# Patient Record
Sex: Female | Born: 1940 | Race: Black or African American | Hispanic: No | Smoking: Former smoker
Health system: Southern US, Community
[De-identification: ages and names within clinical notes are randomized; demographics above are authoritative.]

## PROBLEM LIST (undated history)

## (undated) DIAGNOSIS — J189 Pneumonia, unspecified organism: Secondary | ICD-10-CM

## (undated) DIAGNOSIS — Z940 Kidney transplant status: Secondary | ICD-10-CM

## (undated) DIAGNOSIS — Z803 Family history of malignant neoplasm of breast: Secondary | ICD-10-CM

## (undated) DIAGNOSIS — M19012 Primary osteoarthritis, left shoulder: Secondary | ICD-10-CM

## (undated) DIAGNOSIS — I219 Acute myocardial infarction, unspecified: Secondary | ICD-10-CM

## (undated) DIAGNOSIS — M7542 Impingement syndrome of left shoulder: Secondary | ICD-10-CM

## (undated) DIAGNOSIS — Z8042 Family history of malignant neoplasm of prostate: Secondary | ICD-10-CM

## (undated) DIAGNOSIS — D649 Anemia, unspecified: Secondary | ICD-10-CM

## (undated) DIAGNOSIS — I2510 Atherosclerotic heart disease of native coronary artery without angina pectoris: Secondary | ICD-10-CM

## (undated) DIAGNOSIS — Z992 Dependence on renal dialysis: Secondary | ICD-10-CM

## (undated) DIAGNOSIS — I10 Essential (primary) hypertension: Secondary | ICD-10-CM

## (undated) DIAGNOSIS — E785 Hyperlipidemia, unspecified: Secondary | ICD-10-CM

## (undated) DIAGNOSIS — K219 Gastro-esophageal reflux disease without esophagitis: Secondary | ICD-10-CM

## (undated) DIAGNOSIS — Z8614 Personal history of Methicillin resistant Staphylococcus aureus infection: Secondary | ICD-10-CM

## (undated) DIAGNOSIS — K922 Gastrointestinal hemorrhage, unspecified: Secondary | ICD-10-CM

## (undated) DIAGNOSIS — N186 End stage renal disease: Secondary | ICD-10-CM

## (undated) HISTORY — DX: Gastrointestinal hemorrhage, unspecified: K92.2

## (undated) HISTORY — DX: Pneumonia, unspecified organism: J18.9

## (undated) HISTORY — DX: Atherosclerotic heart disease of native coronary artery without angina pectoris: I25.10

## (undated) HISTORY — PX: ABDOMINAL HYSTERECTOMY: SHX81

## (undated) HISTORY — PX: CARDIAC CATHETERIZATION: SHX172

## (undated) HISTORY — DX: Essential (primary) hypertension: I10

## (undated) HISTORY — DX: Family history of malignant neoplasm of prostate: Z80.42

## (undated) HISTORY — PX: BREAST SURGERY: SHX581

## (undated) HISTORY — DX: End stage renal disease: N18.6

## (undated) HISTORY — DX: Anemia, unspecified: D64.9

## (undated) HISTORY — DX: Dependence on renal dialysis: Z99.2

## (undated) HISTORY — DX: Hyperlipidemia, unspecified: E78.5

## (undated) HISTORY — PX: BREAST EXCISIONAL BIOPSY: SUR124

## (undated) HISTORY — DX: Family history of malignant neoplasm of breast: Z80.3

---

## 1982-11-20 HISTORY — PX: VESICOVAGINAL FISTULA CLOSURE W/ TAH: SUR271

## 1998-06-25 ENCOUNTER — Ambulatory Visit: Admission: RE | Admit: 1998-06-25 | Discharge: 1998-06-25 | Payer: Self-pay | Admitting: Family Medicine

## 1998-08-18 ENCOUNTER — Encounter: Payer: Self-pay | Admitting: Family Medicine

## 1998-08-18 ENCOUNTER — Ambulatory Visit: Admission: RE | Admit: 1998-08-18 | Discharge: 1998-08-18 | Payer: Self-pay | Admitting: Family Medicine

## 1999-10-31 ENCOUNTER — Ambulatory Visit (HOSPITAL_COMMUNITY): Admission: RE | Admit: 1999-10-31 | Discharge: 1999-10-31 | Payer: Self-pay | Admitting: Family Medicine

## 1999-10-31 ENCOUNTER — Encounter: Payer: Self-pay | Admitting: Family Medicine

## 2001-05-01 ENCOUNTER — Encounter (HOSPITAL_COMMUNITY): Admission: RE | Admit: 2001-05-01 | Discharge: 2001-07-30 | Payer: Self-pay | Admitting: Nephrology

## 2001-07-30 ENCOUNTER — Ambulatory Visit (HOSPITAL_COMMUNITY): Admission: RE | Admit: 2001-07-30 | Discharge: 2001-07-30 | Payer: Self-pay | Admitting: Family Medicine

## 2001-07-30 ENCOUNTER — Encounter: Payer: Self-pay | Admitting: Family Medicine

## 2001-07-31 ENCOUNTER — Ambulatory Visit (HOSPITAL_COMMUNITY): Admission: RE | Admit: 2001-07-31 | Discharge: 2001-07-31 | Payer: Self-pay | Admitting: Nephrology

## 2001-08-06 ENCOUNTER — Encounter: Payer: Self-pay | Admitting: Family Medicine

## 2001-08-07 ENCOUNTER — Encounter (HOSPITAL_COMMUNITY): Admission: RE | Admit: 2001-08-07 | Discharge: 2001-11-05 | Payer: Self-pay | Admitting: Nephrology

## 2001-08-08 ENCOUNTER — Encounter: Admission: RE | Admit: 2001-08-08 | Discharge: 2001-08-08 | Payer: Self-pay | Admitting: Family Medicine

## 2001-08-08 ENCOUNTER — Encounter: Payer: Self-pay | Admitting: Family Medicine

## 2001-11-06 ENCOUNTER — Encounter (HOSPITAL_COMMUNITY): Admission: RE | Admit: 2001-11-06 | Discharge: 2002-02-04 | Payer: Self-pay | Admitting: Nephrology

## 2002-01-03 ENCOUNTER — Ambulatory Visit (HOSPITAL_COMMUNITY): Admission: RE | Admit: 2002-01-03 | Discharge: 2002-01-03 | Payer: Self-pay | Admitting: Endocrinology

## 2002-01-03 ENCOUNTER — Encounter: Payer: Self-pay | Admitting: Endocrinology

## 2002-02-06 ENCOUNTER — Encounter (HOSPITAL_COMMUNITY): Admission: RE | Admit: 2002-02-06 | Discharge: 2002-05-07 | Payer: Self-pay | Admitting: Nephrology

## 2002-05-21 ENCOUNTER — Encounter (HOSPITAL_COMMUNITY): Admission: RE | Admit: 2002-05-21 | Discharge: 2002-08-19 | Payer: Self-pay | Admitting: Nephrology

## 2002-06-02 ENCOUNTER — Encounter: Payer: Self-pay | Admitting: Cardiology

## 2002-06-02 ENCOUNTER — Inpatient Hospital Stay (HOSPITAL_COMMUNITY): Admission: EM | Admit: 2002-06-02 | Discharge: 2002-06-11 | Payer: Self-pay | Admitting: Emergency Medicine

## 2002-06-03 ENCOUNTER — Encounter: Payer: Self-pay | Admitting: Cardiology

## 2002-06-04 ENCOUNTER — Encounter: Payer: Self-pay | Admitting: Cardiothoracic Surgery

## 2002-06-05 ENCOUNTER — Encounter: Payer: Self-pay | Admitting: Cardiothoracic Surgery

## 2002-06-06 ENCOUNTER — Encounter: Payer: Self-pay | Admitting: Cardiothoracic Surgery

## 2002-06-06 HISTORY — PX: CORONARY ARTERY BYPASS GRAFT: SHX141

## 2002-06-07 ENCOUNTER — Encounter: Payer: Self-pay | Admitting: Cardiothoracic Surgery

## 2002-06-08 ENCOUNTER — Encounter: Payer: Self-pay | Admitting: Cardiothoracic Surgery

## 2002-06-09 ENCOUNTER — Encounter: Payer: Self-pay | Admitting: Cardiothoracic Surgery

## 2002-06-10 ENCOUNTER — Encounter: Payer: Self-pay | Admitting: Cardiothoracic Surgery

## 2002-07-03 ENCOUNTER — Encounter: Payer: Self-pay | Admitting: Cardiothoracic Surgery

## 2002-07-03 ENCOUNTER — Encounter: Admission: RE | Admit: 2002-07-03 | Discharge: 2002-07-03 | Payer: Self-pay | Admitting: Cardiothoracic Surgery

## 2002-08-04 ENCOUNTER — Encounter (HOSPITAL_COMMUNITY): Admission: RE | Admit: 2002-08-04 | Discharge: 2002-11-02 | Payer: Self-pay | Admitting: Cardiology

## 2002-08-20 ENCOUNTER — Encounter (HOSPITAL_COMMUNITY): Admission: RE | Admit: 2002-08-20 | Discharge: 2002-11-18 | Payer: Self-pay | Admitting: Nephrology

## 2002-11-19 ENCOUNTER — Encounter (HOSPITAL_COMMUNITY): Admission: RE | Admit: 2002-11-19 | Discharge: 2003-02-17 | Payer: Self-pay | Admitting: Nephrology

## 2003-01-22 ENCOUNTER — Encounter: Payer: Self-pay | Admitting: Emergency Medicine

## 2003-01-22 ENCOUNTER — Inpatient Hospital Stay (HOSPITAL_COMMUNITY): Admission: EM | Admit: 2003-01-22 | Discharge: 2003-01-25 | Payer: Self-pay | Admitting: Emergency Medicine

## 2003-01-22 ENCOUNTER — Encounter: Payer: Self-pay | Admitting: Cardiology

## 2003-01-23 ENCOUNTER — Encounter: Payer: Self-pay | Admitting: Cardiology

## 2003-02-18 ENCOUNTER — Encounter (HOSPITAL_COMMUNITY): Admission: RE | Admit: 2003-02-18 | Discharge: 2003-05-19 | Payer: Self-pay | Admitting: Nephrology

## 2003-03-11 ENCOUNTER — Encounter: Payer: Self-pay | Admitting: Nephrology

## 2003-03-11 ENCOUNTER — Encounter: Admission: RE | Admit: 2003-03-11 | Discharge: 2003-03-11 | Payer: Self-pay | Admitting: Nephrology

## 2003-05-20 ENCOUNTER — Encounter (HOSPITAL_COMMUNITY): Admission: RE | Admit: 2003-05-20 | Discharge: 2003-08-18 | Payer: Self-pay | Admitting: Nephrology

## 2003-08-19 ENCOUNTER — Encounter (HOSPITAL_COMMUNITY): Admission: RE | Admit: 2003-08-19 | Discharge: 2003-11-17 | Payer: Self-pay | Admitting: Nephrology

## 2003-09-29 ENCOUNTER — Encounter: Admission: RE | Admit: 2003-09-29 | Discharge: 2003-09-29 | Payer: Self-pay | Admitting: Nephrology

## 2003-10-30 ENCOUNTER — Ambulatory Visit (HOSPITAL_COMMUNITY): Admission: RE | Admit: 2003-10-30 | Discharge: 2003-10-30 | Payer: Self-pay | Admitting: Endocrinology

## 2003-11-18 ENCOUNTER — Encounter (HOSPITAL_COMMUNITY): Admission: RE | Admit: 2003-11-18 | Discharge: 2004-02-16 | Payer: Self-pay | Admitting: Nephrology

## 2004-02-24 ENCOUNTER — Encounter (HOSPITAL_COMMUNITY): Admission: RE | Admit: 2004-02-24 | Discharge: 2004-05-24 | Payer: Self-pay | Admitting: Nephrology

## 2004-05-25 ENCOUNTER — Encounter (HOSPITAL_COMMUNITY): Admission: RE | Admit: 2004-05-25 | Discharge: 2004-08-23 | Payer: Self-pay | Admitting: Nephrology

## 2004-06-05 ENCOUNTER — Inpatient Hospital Stay (HOSPITAL_COMMUNITY): Admission: EM | Admit: 2004-06-05 | Discharge: 2004-06-15 | Payer: Self-pay | Admitting: Emergency Medicine

## 2004-08-31 ENCOUNTER — Encounter (HOSPITAL_COMMUNITY): Admission: RE | Admit: 2004-08-31 | Discharge: 2004-11-29 | Payer: Self-pay | Admitting: Nephrology

## 2004-10-17 ENCOUNTER — Ambulatory Visit: Payer: Self-pay | Admitting: Endocrinology

## 2004-10-24 ENCOUNTER — Ambulatory Visit: Payer: Self-pay | Admitting: Endocrinology

## 2004-11-18 ENCOUNTER — Ambulatory Visit: Payer: Self-pay | Admitting: Endocrinology

## 2004-12-07 ENCOUNTER — Encounter (HOSPITAL_COMMUNITY): Admission: RE | Admit: 2004-12-07 | Discharge: 2005-03-07 | Payer: Self-pay | Admitting: Nephrology

## 2005-03-15 ENCOUNTER — Encounter (HOSPITAL_COMMUNITY): Admission: RE | Admit: 2005-03-15 | Discharge: 2005-06-13 | Payer: Self-pay | Admitting: Nephrology

## 2005-06-13 ENCOUNTER — Encounter (HOSPITAL_COMMUNITY): Admission: RE | Admit: 2005-06-13 | Discharge: 2005-09-11 | Payer: Self-pay | Admitting: Nephrology

## 2005-09-13 ENCOUNTER — Encounter (HOSPITAL_COMMUNITY): Admission: RE | Admit: 2005-09-13 | Discharge: 2005-12-12 | Payer: Self-pay | Admitting: Nephrology

## 2005-12-20 ENCOUNTER — Encounter (HOSPITAL_COMMUNITY): Admission: RE | Admit: 2005-12-20 | Discharge: 2006-03-20 | Payer: Self-pay | Admitting: Nephrology

## 2006-03-28 ENCOUNTER — Encounter (HOSPITAL_COMMUNITY): Admission: RE | Admit: 2006-03-28 | Discharge: 2006-06-26 | Payer: Self-pay | Admitting: Nephrology

## 2006-04-20 ENCOUNTER — Ambulatory Visit: Payer: Self-pay | Admitting: Endocrinology

## 2006-05-02 ENCOUNTER — Ambulatory Visit: Payer: Self-pay | Admitting: Endocrinology

## 2006-05-17 ENCOUNTER — Ambulatory Visit: Payer: Self-pay | Admitting: Internal Medicine

## 2006-06-06 ENCOUNTER — Emergency Department (HOSPITAL_COMMUNITY): Admission: EM | Admit: 2006-06-06 | Discharge: 2006-06-06 | Payer: Self-pay | Admitting: Emergency Medicine

## 2006-06-27 ENCOUNTER — Encounter (HOSPITAL_COMMUNITY): Admission: RE | Admit: 2006-06-27 | Discharge: 2006-08-03 | Payer: Self-pay | Admitting: Nephrology

## 2006-07-12 ENCOUNTER — Ambulatory Visit (HOSPITAL_COMMUNITY): Admission: RE | Admit: 2006-07-12 | Discharge: 2006-07-12 | Payer: Self-pay | Admitting: Endocrinology

## 2006-08-08 ENCOUNTER — Encounter: Admission: RE | Admit: 2006-08-08 | Discharge: 2006-11-06 | Payer: Self-pay | Admitting: Nephrology

## 2006-11-14 ENCOUNTER — Encounter (HOSPITAL_COMMUNITY): Admission: RE | Admit: 2006-11-14 | Discharge: 2007-02-12 | Payer: Self-pay | Admitting: Nephrology

## 2007-02-20 ENCOUNTER — Encounter (HOSPITAL_COMMUNITY): Admission: RE | Admit: 2007-02-20 | Discharge: 2007-05-21 | Payer: Self-pay | Admitting: Nephrology

## 2007-03-01 ENCOUNTER — Ambulatory Visit: Payer: Self-pay | Admitting: Vascular Surgery

## 2007-03-04 ENCOUNTER — Ambulatory Visit (HOSPITAL_COMMUNITY): Admission: RE | Admit: 2007-03-04 | Discharge: 2007-03-04 | Payer: Self-pay | Admitting: Vascular Surgery

## 2007-03-04 ENCOUNTER — Ambulatory Visit: Payer: Self-pay | Admitting: Vascular Surgery

## 2007-03-04 HISTORY — PX: ARTERIOVENOUS GRAFT PLACEMENT: SUR1029

## 2007-03-22 ENCOUNTER — Ambulatory Visit: Payer: Self-pay | Admitting: Vascular Surgery

## 2007-04-01 ENCOUNTER — Ambulatory Visit: Payer: Self-pay

## 2007-05-02 ENCOUNTER — Ambulatory Visit: Payer: Self-pay | Admitting: Endocrinology

## 2007-05-02 LAB — CONVERTED CEMR LAB
AST: 21 units/L (ref 0–37)
BUN: 109 mg/dL (ref 6–23)
Bacteria, UA: NEGATIVE
Bilirubin, Direct: 0.3 mg/dL (ref 0.0–0.3)
CO2: 21 meq/L (ref 19–32)
Calcium: 8.9 mg/dL (ref 8.4–10.5)
Chloride: 110 meq/L (ref 96–112)
Creatinine, Ser: 4.1 mg/dL — ABNORMAL HIGH (ref 0.4–1.2)
Glucose, Bld: 84 mg/dL (ref 70–99)
HCT: 27.4 % — ABNORMAL LOW (ref 36.0–46.0)
Hemoglobin: 9.2 g/dL — ABNORMAL LOW (ref 12.0–15.0)
LDL Cholesterol: 82 mg/dL (ref 0–99)
Lymphocytes Relative: 30.2 % (ref 12.0–46.0)
MCHC: 33.5 g/dL (ref 30.0–36.0)
MCV: 83.2 fL (ref 78.0–100.0)
Mucus, UA: NEGATIVE
Neutrophils Relative %: 56.4 % (ref 43.0–77.0)
Nitrite: NEGATIVE
Potassium: 4.7 meq/L (ref 3.5–5.1)
RBC: 3.29 M/uL — ABNORMAL LOW (ref 3.87–5.11)
RDW: 17.8 % — ABNORMAL HIGH (ref 11.5–14.6)
TSH: 1.39 microintl units/mL (ref 0.35–5.50)
Total CHOL/HDL Ratio: 4.1
Total Protein, Urine: 100 mg/dL — AB
WBC: 4.5 10*3/uL (ref 4.5–10.5)
pH: 6 (ref 5.0–8.0)

## 2007-05-06 ENCOUNTER — Ambulatory Visit: Payer: Self-pay | Admitting: Endocrinology

## 2007-05-08 ENCOUNTER — Ambulatory Visit: Payer: Self-pay | Admitting: Vascular Surgery

## 2007-05-08 ENCOUNTER — Ambulatory Visit (HOSPITAL_COMMUNITY): Admission: RE | Admit: 2007-05-08 | Discharge: 2007-05-08 | Payer: Self-pay | Admitting: Vascular Surgery

## 2007-05-08 HISTORY — PX: THROMBECTOMY AND REVISION OF ARTERIOVENTOUS (AV) GORETEX  GRAFT: SHX6120

## 2007-05-20 ENCOUNTER — Ambulatory Visit: Payer: Self-pay | Admitting: Internal Medicine

## 2007-05-22 ENCOUNTER — Encounter (HOSPITAL_COMMUNITY): Admission: RE | Admit: 2007-05-22 | Discharge: 2007-08-20 | Payer: Self-pay | Admitting: Nephrology

## 2007-08-12 ENCOUNTER — Encounter: Payer: Self-pay | Admitting: *Deleted

## 2007-08-12 DIAGNOSIS — J4489 Other specified chronic obstructive pulmonary disease: Secondary | ICD-10-CM | POA: Insufficient documentation

## 2007-08-12 DIAGNOSIS — M109 Gout, unspecified: Secondary | ICD-10-CM | POA: Insufficient documentation

## 2007-08-12 DIAGNOSIS — N951 Menopausal and female climacteric states: Secondary | ICD-10-CM | POA: Insufficient documentation

## 2007-08-12 DIAGNOSIS — R42 Dizziness and giddiness: Secondary | ICD-10-CM | POA: Insufficient documentation

## 2007-08-12 DIAGNOSIS — J45909 Unspecified asthma, uncomplicated: Secondary | ICD-10-CM | POA: Insufficient documentation

## 2007-08-12 DIAGNOSIS — M810 Age-related osteoporosis without current pathological fracture: Secondary | ICD-10-CM | POA: Insufficient documentation

## 2007-09-04 ENCOUNTER — Encounter (HOSPITAL_COMMUNITY): Admission: RE | Admit: 2007-09-04 | Discharge: 2007-10-23 | Payer: Self-pay | Admitting: Nephrology

## 2007-09-12 ENCOUNTER — Ambulatory Visit (HOSPITAL_COMMUNITY): Admission: RE | Admit: 2007-09-12 | Discharge: 2007-09-12 | Payer: Self-pay | Admitting: Endocrinology

## 2007-12-02 ENCOUNTER — Ambulatory Visit: Payer: Self-pay | Admitting: Internal Medicine

## 2007-12-02 ENCOUNTER — Inpatient Hospital Stay (HOSPITAL_COMMUNITY): Admission: EM | Admit: 2007-12-02 | Discharge: 2007-12-04 | Payer: Self-pay | Admitting: Emergency Medicine

## 2007-12-04 ENCOUNTER — Encounter: Payer: Self-pay | Admitting: Internal Medicine

## 2007-12-20 ENCOUNTER — Ambulatory Visit: Payer: Self-pay | Admitting: Cardiology

## 2008-01-13 ENCOUNTER — Encounter: Payer: Self-pay | Admitting: Endocrinology

## 2008-04-07 ENCOUNTER — Ambulatory Visit: Payer: Self-pay | Admitting: Endocrinology

## 2008-04-07 ENCOUNTER — Other Ambulatory Visit: Admission: RE | Admit: 2008-04-07 | Discharge: 2008-04-07 | Payer: Self-pay | Admitting: Endocrinology

## 2008-04-07 ENCOUNTER — Encounter: Payer: Self-pay | Admitting: Endocrinology

## 2008-07-13 ENCOUNTER — Encounter: Admission: RE | Admit: 2008-07-13 | Discharge: 2008-07-13 | Payer: Self-pay | Admitting: Critical Care Medicine

## 2008-07-20 ENCOUNTER — Ambulatory Visit: Payer: Self-pay | Admitting: Vascular Surgery

## 2008-07-20 ENCOUNTER — Encounter (INDEPENDENT_AMBULATORY_CARE_PROVIDER_SITE_OTHER): Payer: Self-pay | Admitting: Emergency Medicine

## 2008-07-20 ENCOUNTER — Emergency Department (HOSPITAL_COMMUNITY): Admission: EM | Admit: 2008-07-20 | Discharge: 2008-07-20 | Payer: Self-pay | Admitting: Emergency Medicine

## 2008-11-09 ENCOUNTER — Ambulatory Visit (HOSPITAL_COMMUNITY): Admission: RE | Admit: 2008-11-09 | Discharge: 2008-11-09 | Payer: Self-pay | Admitting: Endocrinology

## 2008-11-18 ENCOUNTER — Encounter: Admission: RE | Admit: 2008-11-18 | Discharge: 2008-11-18 | Payer: Self-pay | Admitting: Nephrology

## 2008-12-16 ENCOUNTER — Encounter: Payer: Self-pay | Admitting: Endocrinology

## 2008-12-24 ENCOUNTER — Ambulatory Visit: Payer: Self-pay | Admitting: Cardiology

## 2009-01-07 ENCOUNTER — Telehealth: Payer: Self-pay | Admitting: Endocrinology

## 2009-01-07 ENCOUNTER — Ambulatory Visit: Payer: Self-pay | Admitting: Endocrinology

## 2009-01-09 LAB — CONVERTED CEMR LAB
AST: 23 units/L (ref 0–37)
Albumin: 3.6 g/dL (ref 3.5–5.2)
Alkaline Phosphatase: 78 units/L (ref 39–117)
BUN: 36 mg/dL — ABNORMAL HIGH (ref 6–23)
Basophils Relative: 0.5 % (ref 0.0–3.0)
Calcium: 10 mg/dL (ref 8.4–10.5)
Cholesterol: 133 mg/dL (ref 0–200)
Creatinine, Ser: 7.6 mg/dL (ref 0.4–1.2)
Crystals: NEGATIVE
Eosinophils Absolute: 0.2 10*3/uL (ref 0.0–0.7)
Eosinophils Relative: 3.4 % (ref 0.0–5.0)
Glucose, Bld: 93 mg/dL (ref 70–99)
HCT: 38.2 % (ref 36.0–46.0)
Ketones, ur: 15 mg/dL — AB
LDL Cholesterol: 67 mg/dL (ref 0–99)
MCV: 89.1 fL (ref 78.0–100.0)
Monocytes Absolute: 0.8 10*3/uL (ref 0.1–1.0)
Neutro Abs: 3.7 10*3/uL (ref 1.4–7.7)
Nitrite: NEGATIVE
Platelets: 139 10*3/uL — ABNORMAL LOW (ref 150–400)
RBC: 4.29 M/uL (ref 3.87–5.11)
Specific Gravity, Urine: 1.025 (ref 1.000–1.03)
Total Bilirubin: 0.8 mg/dL (ref 0.3–1.2)
Total CHOL/HDL Ratio: 2.7
Total Protein: 7.2 g/dL (ref 6.0–8.3)
Triglycerides: 84 mg/dL (ref 0–149)
Urobilinogen, UA: 0.2 (ref 0.0–1.0)
VLDL: 17 mg/dL (ref 0–40)
WBC: 6.9 10*3/uL (ref 4.5–10.5)
pH: 5.5 (ref 5.0–8.0)

## 2009-01-11 ENCOUNTER — Ambulatory Visit: Payer: Self-pay | Admitting: Endocrinology

## 2009-01-11 DIAGNOSIS — N2581 Secondary hyperparathyroidism of renal origin: Secondary | ICD-10-CM | POA: Insufficient documentation

## 2009-01-11 DIAGNOSIS — D696 Thrombocytopenia, unspecified: Secondary | ICD-10-CM | POA: Insufficient documentation

## 2009-01-19 ENCOUNTER — Telehealth: Payer: Self-pay | Admitting: Endocrinology

## 2009-06-11 ENCOUNTER — Encounter: Admission: RE | Admit: 2009-06-11 | Discharge: 2009-06-11 | Payer: Self-pay | Admitting: Nephrology

## 2009-06-15 ENCOUNTER — Ambulatory Visit: Payer: Self-pay | Admitting: Internal Medicine

## 2009-06-15 ENCOUNTER — Encounter: Payer: Self-pay | Admitting: Endocrinology

## 2009-08-16 ENCOUNTER — Emergency Department (HOSPITAL_COMMUNITY): Admission: EM | Admit: 2009-08-16 | Discharge: 2009-08-16 | Payer: Self-pay | Admitting: Emergency Medicine

## 2009-10-27 ENCOUNTER — Encounter: Admission: RE | Admit: 2009-10-27 | Discharge: 2009-10-27 | Payer: Self-pay | Admitting: Nephrology

## 2009-10-27 ENCOUNTER — Inpatient Hospital Stay (HOSPITAL_COMMUNITY): Admission: EM | Admit: 2009-10-27 | Discharge: 2009-11-02 | Payer: Self-pay | Admitting: Emergency Medicine

## 2009-11-02 ENCOUNTER — Encounter (INDEPENDENT_AMBULATORY_CARE_PROVIDER_SITE_OTHER): Payer: Self-pay | Admitting: Internal Medicine

## 2009-11-18 ENCOUNTER — Encounter (INDEPENDENT_AMBULATORY_CARE_PROVIDER_SITE_OTHER): Payer: Self-pay | Admitting: *Deleted

## 2009-11-25 ENCOUNTER — Ambulatory Visit (HOSPITAL_COMMUNITY): Admission: RE | Admit: 2009-11-25 | Discharge: 2009-11-25 | Payer: Self-pay | Admitting: Endocrinology

## 2010-01-05 ENCOUNTER — Encounter: Payer: Self-pay | Admitting: Cardiology

## 2010-01-06 ENCOUNTER — Ambulatory Visit: Payer: Self-pay | Admitting: Cardiology

## 2010-01-12 ENCOUNTER — Encounter: Payer: Self-pay | Admitting: Endocrinology

## 2010-01-20 ENCOUNTER — Ambulatory Visit: Payer: Self-pay | Admitting: Endocrinology

## 2010-01-20 ENCOUNTER — Other Ambulatory Visit: Admission: RE | Admit: 2010-01-20 | Discharge: 2010-01-20 | Payer: Self-pay | Admitting: Endocrinology

## 2010-01-20 DIAGNOSIS — M79609 Pain in unspecified limb: Secondary | ICD-10-CM | POA: Insufficient documentation

## 2010-01-25 ENCOUNTER — Telehealth (INDEPENDENT_AMBULATORY_CARE_PROVIDER_SITE_OTHER): Payer: Self-pay | Admitting: *Deleted

## 2010-01-26 ENCOUNTER — Ambulatory Visit: Payer: Self-pay | Admitting: Endocrinology

## 2010-01-27 LAB — CONVERTED CEMR LAB: Hgb A1c MFr Bld: 4.7 % (ref 4.6–6.5)

## 2010-04-26 ENCOUNTER — Telehealth: Payer: Self-pay | Admitting: Cardiology

## 2010-05-02 ENCOUNTER — Telehealth (INDEPENDENT_AMBULATORY_CARE_PROVIDER_SITE_OTHER): Payer: Self-pay | Admitting: *Deleted

## 2010-05-03 ENCOUNTER — Ambulatory Visit: Payer: Self-pay

## 2010-05-03 ENCOUNTER — Encounter: Payer: Self-pay | Admitting: Internal Medicine

## 2010-05-03 ENCOUNTER — Ambulatory Visit: Payer: Self-pay | Admitting: Internal Medicine

## 2010-05-03 ENCOUNTER — Ambulatory Visit (HOSPITAL_COMMUNITY)
Admission: RE | Admit: 2010-05-03 | Discharge: 2010-05-03 | Payer: Self-pay | Source: Home / Self Care | Admitting: Cardiology

## 2010-05-09 ENCOUNTER — Telehealth: Payer: Self-pay | Admitting: Internal Medicine

## 2010-06-07 ENCOUNTER — Encounter: Admission: RE | Admit: 2010-06-07 | Discharge: 2010-06-07 | Payer: Self-pay | Admitting: Nephrology

## 2010-07-01 ENCOUNTER — Encounter: Payer: Self-pay | Admitting: Endocrinology

## 2010-07-08 ENCOUNTER — Encounter: Admission: RE | Admit: 2010-07-08 | Discharge: 2010-07-08 | Payer: Self-pay | Admitting: Gastroenterology

## 2010-08-20 DIAGNOSIS — J189 Pneumonia, unspecified organism: Secondary | ICD-10-CM | POA: Insufficient documentation

## 2010-08-20 HISTORY — DX: Pneumonia, unspecified organism: J18.9

## 2010-09-02 ENCOUNTER — Inpatient Hospital Stay (HOSPITAL_COMMUNITY)
Admission: EM | Admit: 2010-09-02 | Discharge: 2010-09-07 | Payer: Self-pay | Source: Home / Self Care | Admitting: Emergency Medicine

## 2010-09-23 ENCOUNTER — Inpatient Hospital Stay (HOSPITAL_COMMUNITY): Admission: EM | Admit: 2010-09-23 | Discharge: 2010-09-28 | Payer: Self-pay | Admitting: Emergency Medicine

## 2010-10-04 ENCOUNTER — Encounter: Payer: Self-pay | Admitting: Endocrinology

## 2010-10-18 ENCOUNTER — Ambulatory Visit: Payer: Self-pay | Admitting: Oncology

## 2010-10-18 ENCOUNTER — Encounter: Admission: RE | Admit: 2010-10-18 | Discharge: 2010-10-18 | Payer: Self-pay | Admitting: Nephrology

## 2010-10-20 ENCOUNTER — Encounter: Payer: Self-pay | Admitting: Endocrinology

## 2010-10-27 ENCOUNTER — Encounter: Payer: Self-pay | Admitting: Internal Medicine

## 2010-10-27 LAB — CBC & DIFF AND RETIC
BASO%: 0.3 % (ref 0.0–2.0)
Basophils Absolute: 0 10*3/uL (ref 0.0–0.1)
EOS%: 0.5 % (ref 0.0–7.0)
HCT: 30.1 % — ABNORMAL LOW (ref 34.8–46.6)
HGB: 9.8 g/dL — ABNORMAL LOW (ref 11.6–15.9)
MCH: 27.9 pg (ref 25.1–34.0)
MCV: 85.8 fL (ref 79.5–101.0)
MONO%: 9.6 % (ref 0.0–14.0)
NEUT%: 71.2 % (ref 38.4–76.8)
WBC: 7.5 10*3/uL (ref 3.9–10.3)

## 2010-10-27 LAB — CHCC SMEAR

## 2010-10-27 LAB — MORPHOLOGY

## 2010-10-31 LAB — COMPREHENSIVE METABOLIC PANEL
ALT: 8 U/L (ref 0–35)
CO2: 31 mEq/L (ref 19–32)
Sodium: 140 mEq/L (ref 135–145)
Total Bilirubin: 0.3 mg/dL (ref 0.3–1.2)
Total Protein: 6.6 g/dL (ref 6.0–8.3)

## 2010-10-31 LAB — KAPPA/LAMBDA LIGHT CHAINS
Kappa free light chain: 9.11 mg/dL — ABNORMAL HIGH (ref 0.33–1.94)
Kappa:Lambda Ratio: 10.47 — ABNORMAL HIGH (ref 0.26–1.65)
Lambda Free Lght Chn: 0.87 mg/dL (ref 0.57–2.63)

## 2010-10-31 LAB — FERRITIN: Ferritin: 588 ng/mL — ABNORMAL HIGH (ref 10–291)

## 2010-10-31 LAB — IRON AND TIBC: %SAT: 36 % (ref 20–55)

## 2010-10-31 LAB — PROTEIN ELECTROPHORESIS, SERUM
Albumin ELP: 52.6 % — ABNORMAL LOW (ref 55.8–66.1)
Alpha-1-Globulin: 6.8 % — ABNORMAL HIGH (ref 2.9–4.9)
Beta 2: 5.5 % (ref 3.2–6.5)
Total Protein, Serum Electrophoresis: 6.6 g/dL (ref 6.0–8.3)

## 2010-10-31 LAB — ERYTHROPOIETIN: Erythropoietin: 25 m[IU]/mL (ref 2.6–34.0)

## 2010-11-01 ENCOUNTER — Ambulatory Visit (HOSPITAL_COMMUNITY): Admission: RE | Admit: 2010-11-01 | Payer: Self-pay | Source: Home / Self Care | Admitting: Nephrology

## 2010-11-26 ENCOUNTER — Inpatient Hospital Stay (HOSPITAL_COMMUNITY)
Admission: EM | Admit: 2010-11-26 | Discharge: 2010-11-29 | Payer: Self-pay | Source: Home / Self Care | Attending: Internal Medicine | Admitting: Internal Medicine

## 2010-11-26 DIAGNOSIS — K922 Gastrointestinal hemorrhage, unspecified: Secondary | ICD-10-CM

## 2010-11-26 HISTORY — DX: Gastrointestinal hemorrhage, unspecified: K92.2

## 2010-12-01 ENCOUNTER — Ambulatory Visit (HOSPITAL_COMMUNITY)
Admission: RE | Admit: 2010-12-01 | Discharge: 2010-12-01 | Payer: Self-pay | Source: Home / Self Care | Attending: Endocrinology | Admitting: Endocrinology

## 2010-12-05 LAB — DIFFERENTIAL
Basophils Absolute: 0 10*3/uL (ref 0.0–0.1)
Basophils Absolute: 0 10*3/uL (ref 0.0–0.1)
Basophils Relative: 1 % (ref 0–1)
Basophils Relative: 0 % (ref 0–1)
Eosinophils Absolute: 0.1 10*3/uL (ref 0.0–0.7)
Eosinophils Absolute: 0.2 10*3/uL (ref 0.0–0.7)
Eosinophils Relative: 3 % (ref 0–5)
Eosinophils Relative: 3 % (ref 0–5)
Lymphocytes Relative: 28 % (ref 12–46)
Lymphocytes Relative: 17 % (ref 12–46)
Lymphs Abs: 1.6 10*3/uL (ref 0.7–4.0)
Lymphs Abs: 1.2 10*3/uL (ref 0.7–4.0)
Monocytes Absolute: 0.5 10*3/uL (ref 0.1–1.0)
Monocytes Absolute: 0.8 10*3/uL (ref 0.1–1.0)
Monocytes Relative: 9 % (ref 3–12)
Monocytes Relative: 12 % (ref 3–12)
Neutro Abs: 3.3 10*3/uL (ref 1.7–7.7)
Neutro Abs: 4.9 10*3/uL (ref 1.7–7.7)
Neutrophils Relative %: 60 % (ref 43–77)
Neutrophils Relative %: 69 % (ref 43–77)

## 2010-12-05 LAB — CBC
HCT: 19.5 % — ABNORMAL LOW (ref 36.0–46.0)
HCT: 25.7 % — ABNORMAL LOW (ref 36.0–46.0)
HCT: 27.8 % — ABNORMAL LOW (ref 36.0–46.0)
HCT: 27.9 % — ABNORMAL LOW (ref 36.0–46.0)
HCT: 26.6 % — ABNORMAL LOW (ref 36.0–46.0)
HCT: 31.5 % — ABNORMAL LOW (ref 36.0–46.0)
Hemoglobin: 6.5 g/dL — CL (ref 12.0–15.0)
Hemoglobin: 8.5 g/dL — ABNORMAL LOW (ref 12.0–15.0)
Hemoglobin: 9.3 g/dL — ABNORMAL LOW (ref 12.0–15.0)
Hemoglobin: 9.4 g/dL — ABNORMAL LOW (ref 12.0–15.0)
Hemoglobin: 10.3 g/dL — ABNORMAL LOW (ref 12.0–15.0)
Hemoglobin: 8.9 g/dL — ABNORMAL LOW (ref 12.0–15.0)
MCH: 28.3 pg (ref 26.0–34.0)
MCH: 27.9 pg (ref 26.0–34.0)
MCH: 28.2 pg (ref 26.0–34.0)
MCH: 28.3 pg (ref 26.0–34.0)
MCH: 27.9 pg (ref 26.0–34.0)
MCH: 28.4 pg (ref 26.0–34.0)
MCHC: 33.3 g/dL (ref 30.0–36.0)
MCHC: 33.1 g/dL (ref 30.0–36.0)
MCHC: 33.5 g/dL (ref 30.0–36.0)
MCHC: 33.7 g/dL (ref 30.0–36.0)
MCHC: 32.7 g/dL (ref 30.0–36.0)
MCHC: 33.5 g/dL (ref 30.0–36.0)
MCV: 84.8 fL (ref 78.0–100.0)
MCV: 83.8 fL (ref 78.0–100.0)
MCV: 84.3 fL (ref 78.0–100.0)
MCV: 84.5 fL (ref 78.0–100.0)
MCV: 85 fL (ref 78.0–100.0)
MCV: 85.4 fL (ref 78.0–100.0)
Platelets: 149 10*3/uL — ABNORMAL LOW (ref 150–400)
Platelets: 137 10*3/uL — ABNORMAL LOW (ref 150–400)
Platelets: 148 10*3/uL — ABNORMAL LOW (ref 150–400)
Platelets: 158 10*3/uL (ref 150–400)
Platelets: 152 10*3/uL (ref 150–400)
Platelets: 161 10*3/uL (ref 150–400)
RBC: 2.3 MIL/uL — ABNORMAL LOW (ref 3.87–5.11)
RBC: 3.05 MIL/uL — ABNORMAL LOW (ref 3.87–5.11)
RBC: 3.29 MIL/uL — ABNORMAL LOW (ref 3.87–5.11)
RBC: 3.33 MIL/uL — ABNORMAL LOW (ref 3.87–5.11)
RBC: 3.13 MIL/uL — ABNORMAL LOW (ref 3.87–5.11)
RBC: 3.69 MIL/uL — ABNORMAL LOW (ref 3.87–5.11)
RDW: 19 % — ABNORMAL HIGH (ref 11.5–15.5)
RDW: 17.4 % — ABNORMAL HIGH (ref 11.5–15.5)
RDW: 17.6 % — ABNORMAL HIGH (ref 11.5–15.5)
RDW: 17.6 % — ABNORMAL HIGH (ref 11.5–15.5)
RDW: 17.8 % — ABNORMAL HIGH (ref 11.5–15.5)
RDW: 18 % — ABNORMAL HIGH (ref 11.5–15.5)
WBC: 5.5 10*3/uL (ref 4.0–10.5)
WBC: 11.8 10*3/uL — ABNORMAL HIGH (ref 4.0–10.5)
WBC: 6.5 10*3/uL (ref 4.0–10.5)
WBC: 7.1 10*3/uL (ref 4.0–10.5)
WBC: 7 10*3/uL (ref 4.0–10.5)
WBC: 7.2 10*3/uL (ref 4.0–10.5)

## 2010-12-05 LAB — BASIC METABOLIC PANEL
BUN: 15 mg/dL (ref 6–23)
BUN: 26 mg/dL — ABNORMAL HIGH (ref 6–23)
BUN: 36 mg/dL — ABNORMAL HIGH (ref 6–23)
CO2: 34 mEq/L — ABNORMAL HIGH (ref 19–32)
CO2: 28 mEq/L (ref 19–32)
CO2: 30 mEq/L (ref 19–32)
Calcium: 9.5 mg/dL (ref 8.4–10.5)
Calcium: 9.3 mg/dL (ref 8.4–10.5)
Calcium: 9.2 mg/dL (ref 8.4–10.5)
Chloride: 97 mEq/L (ref 96–112)
Chloride: 99 mEq/L (ref 96–112)
Chloride: 99 mEq/L (ref 96–112)
Creatinine, Ser: 4.7 mg/dL — ABNORMAL HIGH (ref 0.4–1.2)
Creatinine, Ser: 7.12 mg/dL — ABNORMAL HIGH (ref 0.4–1.2)
Creatinine, Ser: 9.1 mg/dL — ABNORMAL HIGH (ref 0.4–1.2)
GFR calc Af Amer: 11 mL/min — ABNORMAL LOW (ref >60)
GFR calc Af Amer: 7 mL/min — ABNORMAL LOW (ref >60)
GFR calc Af Amer: 5 mL/min — ABNORMAL LOW (ref >60)
GFR calc non Af Amer: 9 mL/min — ABNORMAL LOW (ref >60)
GFR calc non Af Amer: 6 mL/min — ABNORMAL LOW (ref >60)
GFR calc non Af Amer: 4 mL/min — ABNORMAL LOW (ref >60)
Glucose, Bld: 96 mg/dL (ref 70–99)
Glucose, Bld: 97 mg/dL (ref 70–99)
Glucose, Bld: 93 mg/dL (ref 70–99)
Potassium: 3.8 mEq/L (ref 3.5–5.1)
Potassium: 3.6 mEq/L (ref 3.5–5.1)
Potassium: 3.6 mEq/L (ref 3.5–5.1)
Sodium: 138 mEq/L (ref 135–145)
Sodium: 140 mEq/L (ref 135–145)
Sodium: 140 mEq/L (ref 135–145)

## 2010-12-05 LAB — CROSSMATCH
ABO/RH(D): O POS
Antibody Screen: NEGATIVE
Unit division: 0
Unit division: 0

## 2010-12-05 LAB — GLUCOSE, CAPILLARY
Glucose-Capillary: 156 mg/dL — ABNORMAL HIGH (ref 70–99)
Glucose-Capillary: 134 mg/dL — ABNORMAL HIGH (ref 70–99)
Glucose-Capillary: 85 mg/dL (ref 70–99)
Glucose-Capillary: 92 mg/dL (ref 70–99)
Glucose-Capillary: 97 mg/dL (ref 70–99)
Glucose-Capillary: 101 mg/dL — ABNORMAL HIGH (ref 70–99)
Glucose-Capillary: 124 mg/dL — ABNORMAL HIGH (ref 70–99)
Glucose-Capillary: 79 mg/dL (ref 70–99)
Glucose-Capillary: 85 mg/dL (ref 70–99)
Glucose-Capillary: 95 mg/dL (ref 70–99)
Glucose-Capillary: 95 mg/dL (ref 70–99)
Glucose-Capillary: 98 mg/dL (ref 70–99)
Glucose-Capillary: 99 mg/dL (ref 70–99)

## 2010-12-05 LAB — PROTIME-INR
INR: 0.99 (ref 0.00–1.49)
Prothrombin Time: 13.3 seconds (ref 11.6–15.2)

## 2010-12-05 LAB — APTT: aPTT: 32 seconds (ref 24–37)

## 2010-12-05 LAB — MRSA PCR SCREENING: MRSA by PCR: NEGATIVE

## 2010-12-05 LAB — OCCULT BLOOD, POC DEVICE: Fecal Occult Bld: NEGATIVE

## 2010-12-11 ENCOUNTER — Encounter: Payer: Self-pay | Admitting: Nephrology

## 2010-12-20 NOTE — Assessment & Plan Note (Signed)
Summary: Cardiology Nuclear Study  Nuclear Med Background Indications for Stress Test: Evaluation for Ischemia, Graft Patency  Indications Comments: Kidney Transplant list   History: Angioplasty, Asthma, CABG, Echo, Heart Catheterization, Myocardial Infarction, Myocardial Perfusion Study  History Comments: 03/08 MI NSTEMI 03/08 CABG x4 05/08 MPS EF 66% (-) ischemia 01/09 Heart Cath patent grafts 01/09 Angioplasty 01/09 ECHO EF 65%      Nuclear Pre-Procedure Cardiac Risk Factors: Hypertension, Lipids, NIDDM Caffeine/Decaff Intake: None NPO After: 6:00 PM Lungs: clear IV 0.9% NS with Angio Cath: 22g     IV Site: (R) wrist IV Started by: Stanton Kidney EMT-P Chest Size (in) 36     Cup Size B     Height (in): 63 Weight (lb): 156 BMI: 27.73 Tech Comments: CBG= 107 @ 6:30 am this day, per Patient.  Nuclear Med Study 1 or 2 day study:  1 day     Stress Test Type:  Eugenie Birks Reading MD:  Arvilla Meres, MD     Referring MD:  J.Katz Resting Radionuclide:  Technetium 92m Tetrofosmin     Resting Radionuclide Dose:  11.0 mCi  Stress Radionuclide:  Technetium 28m Tetrofosmin     Stress Radionuclide Dose:  33.0 mCi   Stress Protocol   Lexiscan: 0.4 mg   Stress Test Technologist:  Milana Na EMT-P     Nuclear Technologist:  Domenic Polite CNMT  Rest Procedure  Myocardial perfusion imaging was performed at rest 45 minutes following the intravenous administration of Myoview Technetium 40m Tetrofosmin.  Stress Procedure  The patient received IV Lexiscan 0.4 mg over 15-seconds.  Myoview injected at 30-seconds.  There were no significant changes with infusion.  Quantitative spect images were obtained after a 45 minute delay.  QPS Raw Data Images:  There is a breast shadow that accounts for the anterior attenuation. Stress Images:  Decreased uptake in the anterior and inferior walls Rest Images:  Decreased uptake in the anterior and inferior walls Subtraction (SDS):  Previous  inferior infarct with very mild peri-infarct ischemia. There is a fixed anterior defect that is most consistent with breast attenuation. Transient Ischemic Dilatation:  1.02  (Normal <1.22)  Lung/Heart Ratio:  .35  (Normal <0.45)  Quantitative Gated Spect Images QGS EDV:  94 ml QGS ESV:  31 ml QGS EF:  67 % QGS cine images:  Normal  Findings Low risk nuclear study  Evidence for inferior infarct     Overall Impression  Exercise Capacity: Lexiscan study with no exercise. ECG Impression: No significant ST segment change suggestive of ischemia. Overall Impression: Low risk stress nuclear study. Overall Impression Comments: Previous inferior infarct with very mild peri-infarct ischemia. There is a mild fixed anterior defect that is most consistent with breast attenuation.  Appended Document: Cardiology Nuclear Study Good result.  Appended Document: Cardiology Nuclear Study Left message to call back   Appended Document: Cardiology Nuclear Study Left message to call back   Appended Document: Cardiology Nuclear Study pt aware

## 2010-12-20 NOTE — Miscellaneous (Signed)
  Clinical Lists Changes  Problems: Added new problem of CORONARY ARTERY BYPASS GRAFT, HX OF (ICD-V45.81) Observations: Added new observation of PAST MED HX: Anemia-NOS Asthma CAD....catheterization... January, 2009.... grafts patent CABG...2003 LV  normal function Diabetes mellitus, type II Gout Hyperlipidemia Hypertension Osteoporosis ESRD.Marland Kitchen dialysis ??? aspirin allergy  ??? tolerates low-dose aspirin Hyperparathyroidism  (01/05/2010 13:49)       Past History:  Past Medical History: Anemia-NOS Asthma CAD....catheterization... January, 2009.... grafts patent CABG...2003 LV  normal function Diabetes mellitus, type II Gout Hyperlipidemia Hypertension Osteoporosis ESRD.Marland Kitchen dialysis ??? aspirin allergy  ??? tolerates low-dose aspirin Hyperparathyroidism

## 2010-12-20 NOTE — Progress Notes (Signed)
Summary: returning call   Phone Note Call from Patient   Caller: Patient [phone] Summary of Call: pt returning call from last week-was out of town Initial call taken by: Glynda Jaeger,  May 09, 2010 3:33 PM  Follow-up for Phone Call        pt aware of results, [facility] at [phone] per pt request Meredith Staggers, RN  May 09, 2010 3:50 PM

## 2010-12-20 NOTE — Miscellaneous (Signed)
  Clinical Lists Changes  Observations: Added new observation of PAST MED HX: Anemia-NOS Asthma CAD....catheterization... January, 2009.... grafts patent CABG...2003 EF 60%... echo... January, 2009 Diabetes mellitus, type II Gout Hyperlipidemia Hypertension Osteoporosis ESRD.Marland Kitchen dialysis ??? aspirin allergy  ??? tolerates low-dose aspirin Hyperparathyroidism  (01/05/2010 17:48)       Past History:  Past Medical History: Anemia-NOS Asthma CAD....catheterization... January, 2009.... grafts patent CABG...2003 EF 60%... echo... January, 2009 Diabetes mellitus, type II Gout Hyperlipidemia Hypertension Osteoporosis ESRD.Marland Kitchen dialysis ??? aspirin allergy  ??? tolerates low-dose aspirin Hyperparathyroidism

## 2010-12-20 NOTE — Progress Notes (Signed)
Summary: Nuclear Pre-Procedure  Phone Note Outgoing Call   Call placed by: tara williams Summary of Call: Left message with information on Myoview Information Sheet (see scanned document for details).      Nuclear Med Background Indications for Stress Test: Evaluation for Ischemia   History: Angioplasty, Asthma, CABG, Echo, Heart Catheterization, Myocardial Infarction, Myocardial Perfusion Study  History Comments: 03/08 MI NSTEMI 03/08 CABG x4 05/08 MPS EF 66% (-) ischemia 01/09 Heart Cath patent grafts 01/09 Angioplasty 01/09 ECHO EF 65%      Nuclear Pre-Procedure Cardiac Risk Factors: Hypertension, Lipids, NIDDM Height (in): 63  Nuclear Med Study Referring MD:  [employer]

## 2010-12-20 NOTE — Assessment & Plan Note (Signed)
Summary: YEARLY--MEDICARE---STC   Vital Signs:  Patient profile:   70 year old female Height:      63 inches (160.02 cm) Weight:      156 pounds (70.91 kg) O2 Sat:      98 % on Room air Temp:     97.6 degrees F (36.44 degrees C) oral Pulse rate:   85 / minute BP sitting:   110 / 70  (right arm) Cuff size:   regular  Vitals Entered By: Sydell Axon (January 20, 2010 8:26 AM)  O2 Flow:  Room air CC: Yearly/ pt states dr @[facility]  says pt needs pap, even though she has hysterectomy/ [place] Is Patient Diabetic? Yes   Primary Provider:  Romero Belling, MD  CC:  Yearly/ pt states dr @[facility]  says pt needs pap and even though she has hysterectomy/ [place].  History of Present Illness: here for regular wellness examination.  she's feeling pretty well in general, and does not drink or smoke.   Current Medications (verified): 1)  Simvastatin 80 Mg Tabs (Simvastatin) .... Take One Tablet By Mouth Daily At Bedtime 2)  Actos 45 Mg  Tabs (Pioglitazone Hcl) .... Take 1 By Mouth Qd 3)  Allopurinol 300 Mg  Tabs (Allopurinol) .... Take 1 By Mouth Qd 4)  Aspirin 81 Mg Tbec (Aspirin) .... Take One Tablet By Mouth Daily 5)  Serevent Diskus 50 Mcg/dose  Aepb (Salmeterol Xinafoate) .... Take 2 Puff Two Times A Day Qd 6)  Albuterol 90 Mcg/act  Aers (Albuterol) .... Take 2 Puff Two Times A Day Prn 7)  Calcium Acetate 667 Mg Caps (Calcium Acetate (Phos Binder)) .... Take 1 Three Times A Day 8)  Biotin 1000 Mcg Tabs (Biotin) .... Once Daily 9)  Prilosec 20 Mg Cpdr (Omeprazole) .... Once Daily  Allergies (verified): 1)  ! Lipitor 2)  ! * Lactulose  Family History: Reviewed history from 01/11/2009 and no changes required. no cancer  Social History: Reviewed history from 01/11/2009 and no changes required. retired divorced  Review of Systems  The patient denies fever, weight loss, weight gain, vision loss, decreased hearing, chest pain, syncope, dyspnea on exertion, prolonged cough, headaches, abdominal  pain, melena, hematochezia, severe indigestion/heartburn, hematuria, suspicious skin lesions, and depression.    Physical Exam  General:  normal appearance.   Head:  head: no deformity eyes: no periorbital swelling, no proptosis external nose and ears are normal mouth: no lesion seen Neck:  Supple without thyroid enlargement or tenderness.  Chest Wall:  there is a mmedian sternotomy scar. Breasts:  No tenderness, masses, nipple discharge, or skin abnormalities.  Lungs:  Clear to auscultation bilaterally. Normal respiratory effort.  Heart:  Regular rate and rhythm without murmurs or gallops noted. Normal S1,S2.   Abdomen:  abdomen is soft, nontender.  no hepatosplenomegaly.   not distended.  no hernia  Rectal:  normal external and internal exam.  heme neg  Genitalia:  Pelvic Exam:        External: normal female genitalia without lesions or masses        Vagina: normal without lesions or masses              normal bimanual exam without masses or fullness               Pap smear: performed Msk:  muscle bulk and strength are grossly normal.  no obvious joint swelling.  gait is normal and steady  Pulses:  dorsalis pedis intact bilat.  no carotid bruit  Neurologic:  cn 2-12 grossly intact.   readily moves all 4's.   sensation is intact to touch on the feet  Skin:  normal texture and temp.  no rash.  not diaphoretic  Cervical Nodes:  No significant adenopathy.  Psych:  Alert and cooperative; normal mood and affect; normal attention span and concentration.   Additional Exam:  SEPARATE EVALUATION FOLLOWS--EACH PROBLEM HERE IS NEW, NOT RESPONDING TO TREATMENT, OR POSES SIGNIFICANT RISK TO THE PATIENT'S HEALTH: HISTORY OF THE PRESENT ILLNESS: pt states burning of the feet, worse upon standing. pt states right ear is "clogged."  irrigation in the office today does not relieve it. PAST MEDICAL HISTORY reviewed and up to date today REVIEW OF SYSTEMS: denies otalgia PHYSICAL  EXAMINATION: no deformity.  no ulcer on the feet.  feet are of normal color and temp.  no edema.  there is a vein harvest scar on the right leg. right eac:  occluded with cerumen. LAB/XRAY RESULTS: IMPRESSION: foot pain, uncertain etiology cerumen impaction, persistent PLAN: see insruction sheet    Impression & Recommendations:  Problem # 1:  ROUTINE GENERAL MEDICAL EXAM@HEALTH  CARE FACL (ICD-V70.0)  Medications Added to Medication List This Visit: 1)  Lipids 272.0  .... A1c 250.00 bmet 250.00 liver v58.69 tsh 250.00  cbc 285.9  Other Orders: Est. Patient Level III (16109) Est. Patient 65& > (60454)  Preventive Care Screening  Mammogram:    Date:  10/20/2009    Results:  historical   Last Flu Shot:    Date:  08/20/2009    Results:  historical    Patient Instructions: 1)  please bring the prescription i am giving you today to dialysis, and ask that the blood test results be sent here. 2)  put a few drops of peroxide into the left ear, and leave there for about 15 minutes.  thern rinse with water.   3)  Please schedule a follow-up appointment in 6 months. Prescriptions: LIPIDS 272.0 a1c 250.00 bmet 250.00 liver v58.69 tsh 250.00  cbc 285.9  #0 x 0   Entered and Authorized by:   Minus Breeding MD   Signed by:   Minus Breeding MD on 01/20/2010   Method used:   Print then Give to Patient   RxID:   [id number]   Preventive Care Screening  Mammogram:    Date:  10/20/2009    Results:  historical   Last Flu Shot:    Date:  08/20/2009    Results:  historical

## 2010-12-20 NOTE — Letter (Signed)
Summary: [facility]   Imported By: Lester [place] 10/11/2010 08:23:37  _____________________________________________________________________  External Attachment:    Type:   Image     Comment:   External Document

## 2010-12-20 NOTE — Progress Notes (Signed)
  Phone Note Outgoing Call   Summary of Call: Called pt and informed her that MD still needs her A1C. Pt stated she would come in to our labs and have blood drawn. Can you give me the code for A1C and I will put order in IDX?  Initial call taken by: Josph Macho RMA,  January 25, 2010 8:14 AM  Follow-up for Phone Call        250.00 Follow-up by: Minus Breeding MD,  January 25, 2010 8:19 AM  Additional Follow-up for Phone Call Additional follow up Details #1::        put orders in IDX. Additional Follow-up by: Josph Macho RMA,  January 25, 2010 9:26 AM

## 2010-12-20 NOTE — Progress Notes (Signed)
Summary: Pt have question about getting a stress test   Phone Note Call from Patient Call back at Home Phone [phone]   Caller: Patient Summary of Call: Pt have question about getting a stress test Initial call taken by: Judie Grieve,  April 26, 2010 9:11 AM  Follow-up for Phone Call        spoke w/pt she states she gets stress test every year b/c she is on the transplant list, have requested chart to see when last test was done Meredith Staggers, RN  April 26, 2010 12:09 PM   Last Celine Ahr was 04/01/07 will make sure ok w/Dr Myrtis Ser and order Royann Shivers, RN  April 26, 2010 12:21 PM   Additional Follow-up for Phone Call Additional follow up Details #1::        If this is a transplant requirement, it is fine with me to go ahead.  Talitha Givens, MD, [facility]  April 26, 2010 2:17 PM  order put in for [facility], RN  April 26, 2010 4:11 PM

## 2010-12-20 NOTE — Assessment & Plan Note (Signed)
Summary: f1y  Medications Added SIMVASTATIN 80 MG TABS (SIMVASTATIN) Take one tablet by mouth daily at bedtime ASPIRIN 81 MG TBEC (ASPIRIN) Take one tablet by mouth daily BIOTIN 1000 MCG TABS (BIOTIN) once daily PRILOSEC 20 MG CPDR (OMEPRAZOLE) once daily      Allergies Added:   Visit Type:  Follow-up Primary Provider:  Romero Belling, MD  CC:  CAD.  History of Present Illness: Patient  is seen for one-year followup of coronary disease.  She is doing well. She's not having any chest pain or shortness of breath.  Current Medications (verified): 1)  Simvastatin 80 Mg Tabs (Simvastatin) .... Take One Tablet By Mouth Daily At Bedtime 2)  Actos 45 Mg  Tabs (Pioglitazone Hcl) .... Take 1 By Mouth Qd 3)  Allopurinol 300 Mg  Tabs (Allopurinol) .... Take 1 By Mouth Qd 4)  Aspirin 81 Mg Tbec (Aspirin) .... Take One Tablet By Mouth Daily 5)  Serevent Diskus 50 Mcg/dose  Aepb (Salmeterol Xinafoate) .... Take 2 Puff Two Times A Day Qd 6)  Albuterol 90 Mcg/act  Aers (Albuterol) .... Take 2 Puff Two Times A Day Prn 7)  Calcium Acetate 667 Mg Caps (Calcium Acetate (Phos Binder)) .... Take 1 Three Times A Day 8)  Biotin 1000 Mcg Tabs (Biotin) .... Once Daily 9)  Prilosec 20 Mg Cpdr (Omeprazole) .... Once Daily  Allergies (verified): 1)  ! Lipitor 2)  ! * Lactulose  Past History:  Past Medical History: Last updated: 01/05/2010 Anemia-NOS Asthma CAD....catheterization... January, 2009.... grafts patent CABG...2003 EF 60%... echo... January, 2009 Diabetes mellitus, type II Gout Hyperlipidemia Hypertension Osteoporosis ESRD.Marland Kitchen dialysis ??? aspirin allergy  ??? tolerates low-dose aspirin Hyperparathyroidism  Review of Systems       Patient denies fever, chills, headache, sweats, rash, change in vision, change in hearing, chest pain, cough, nausea vomiting, urinary symptoms.  All of the systems are reviewed and are negative.  Vital Signs:  Patient profile:   70 year old  female Height:      63 inches Weight:      156 pounds BMI:     27.73 Pulse rate:   80 / minute BP sitting:   108 / 64  (left arm) Cuff size:   regular  Vitals Entered By: Hardin Negus, RMA (January 06, 2010 9:49 AM)  Physical Exam  General:  patient is quite stable today. Eyes:  no xanthelasma. Neck:  no jugular venous distention. Lungs:  lungs are clear.  Respiratory effort is nonlabored. Heart:  cardiac exam reveals an S1-S2.  No clicks or significant murmurs. Abdomen:  abdomen is soft. Extremities:  no peripheral edema. Psych:  patient is oriented to person time and place.  Affect is normal.   Impression & Recommendations:  Problem # 1:  CORONARY ARTERY DISEASE (ICD-414.00)  Her updated medication list for this problem includes:    Aspirin 81 Mg Tbec (Aspirin) .Marland Kitchen... Take one tablet by mouth daily  Orders: EKG w/ Interpretation (93000) The patient is not having any significant symptoms.  EKG is done and reviewed by me.  The QRS is normal.  No further workup is needed.  Problem # 2:  HYPERTENSION (ICD-401.9)  Her updated medication list for this problem includes:    Aspirin 81 Mg Tbec (Aspirin) .Marland Kitchen... Take one tablet by mouth daily Blood pressure is under good control.  No change in therapy.  Problem # 3:  HYPERLIPIDEMIA (ICD-272.4)  Her updated medication list for this problem includes:    Simvastatin 80 Mg Tabs (  Simvastatin) .Marland Kitchen... Take one tablet by mouth daily at bedtime The patient's lipids are followed by Dr.Ellison.  No change in therapy.  Patient Instructions: 1)  Follow up in 1 year

## 2010-12-20 NOTE — Letter (Signed)
Summary: [facility]   Imported By: Sherian Rein 07/20/2010 12:02:54  _____________________________________________________________________  External Attachment:    Type:   Image     Comment:   External Document

## 2010-12-21 ENCOUNTER — Encounter: Payer: Self-pay | Admitting: Endocrinology

## 2010-12-21 ENCOUNTER — Encounter: Payer: Self-pay | Admitting: Cardiology

## 2010-12-22 ENCOUNTER — Encounter: Payer: Self-pay | Admitting: Cardiology

## 2010-12-22 ENCOUNTER — Ambulatory Visit (INDEPENDENT_AMBULATORY_CARE_PROVIDER_SITE_OTHER): Payer: Medicare Other | Admitting: Cardiology

## 2010-12-22 ENCOUNTER — Ambulatory Visit: Admit: 2010-12-22 | Payer: Self-pay | Admitting: Cardiology

## 2010-12-22 DIAGNOSIS — I2510 Atherosclerotic heart disease of native coronary artery without angina pectoris: Secondary | ICD-10-CM

## 2010-12-22 NOTE — Procedures (Signed)
Summary: Colonoscopy / [facility]  Colonoscopy / [facility]   Imported By: Lennie Odor 11/02/2010 16:53:48  _____________________________________________________________________  External Attachment:    Type:   Image     Comment:   External Document

## 2010-12-22 NOTE — Letter (Signed)
Summary: [facility]  [facility]   Imported By: Sherian Rein 11/08/2010 13:53:32  _____________________________________________________________________  External Attachment:    Type:   Image     Comment:   External Document

## 2010-12-28 NOTE — Assessment & Plan Note (Signed)
Summary: [name]   Vital Signs:  Patient profile:   70 year old female Height:      63 inches Weight:      141 pounds BMI:     25.07 Pulse rate:   80 / minute BP sitting:   122 / 64  (left arm) Cuff size:   regular  Vitals Entered By: Hardin Negus, RMA (December 22, 2010 9:32 AM)  Visit Type:  post hospital visit , transplant clearance Referring Provider:  Beryle Lathe, MD  [facility] Primary Provider:  Romero Belling, MD  CC:  CAD.  History of Present Illness: The patient is seen post hospitalization for GI bleed.  She has end-stage renal disease on dialysis.  She has coronary disease post CABG in 2003.  Repeat catheterization had been done in 2009 and her grafts were patent.  Nuclear scan was done in 2011 showing no ischemia.  In January, 2012 patient developed melena and was noted to have a hemoglobin as low as 5.5.  She was transfused.  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  Her aspirin was stopped.  Heparin at the time of dialysis was held.  Plan was to hold her aspirin for 2-3 weeks.  Consideration could be given to using Plavix instead of aspirin.  Patient feels much better since her hospitalization.  Preventive Screening-Counseling & Management  Alcohol-Tobacco     Smoking Status: quit  Caffeine-Diet-Exercise     Does Patient Exercise: yes      Drug Use:  no.    Current Medications (verified): 1)  Vytorin 10-80 Mg Tabs (Ezetimibe-Simvastatin) .... Take One Tablet By Mouth Dailyat Bedtimer 2)  Actos 45 Mg  Tabs (Pioglitazone Hcl) .... Take 1 By Mouth Qd 3)  Allopurinol 300 Mg  Tabs (Allopurinol) .... Take 1 By Mouth Qd 4)  Serevent Diskus 50 Mcg/dose  Aepb (Salmeterol Xinafoate) .... Take 2 Puff Two Times A Day Qd 5)  Albuterol 90 Mcg/act  Aers (Albuterol) .... Take 2 Puff Two Times A Day Prn 6)  Calcium Acetate 667 Mg Caps (Calcium Acetate (Phos Binder)) .... Take 1 Three Times A Day 7)  Biotin 1000 Mcg Tabs (Biotin) .... Once Daily 8)  Prilosec  20 Mg Cpdr (Omeprazole) .... Once Daily 9)  Fluticasone Propionate 50 Mcg/act Susp (Fluticasone Propionate) .... Once Daily 10)  Dialyvite 800 0.8 Mg Tabs (B Complex-C-Folic Acid) .... Once Daily  Allergies (verified): 1)  ! Lipitor 2)  ! * Lactulose  Past History:  Past Medical History: Anemia-NOS  /  GI bleed November 26, 2010.. hemoglobin 5.5.Marland KitchenMarland Kitchen transfused.. 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 hold aspirin 2-3 weeks... consider Plavix instead of aspirin..Dr. Ewing Schlein GI bleed.... November 26, 2010 Asthma CAD....catheterization... January, 2009.... grafts patent CABG...2003 EF 60%... echo... January, 2009 Diabetes mellitus, type II Gout Hyperlipidemia Hypertension Osteoporosis ESRD.Marland Kitchen dialysis ??? aspirin allergy  ??? tolerates low-dose aspirin Hyperparathyroidism Pneumonia    hospitalization... October, 2011 Nuclear... June of 14, 2011.... renal transplant assessment... prior inferior MI with mild peri-infarct ischemia... anterior breast attenuation.... EF 67%..  Family History: no cancer Family History of Coronary Artery Disease:  Family History of Diabetes:  Family History of Hyperlipidemia:  Family History of Hypertension:  Family History of Renal Disease:   Social History: retired divorced Tobacco Use - Former.  Alcohol Use - no Regular Exercise - yes -- walking Drug Use - no Does Patient Exercise:  yes Drug Use:  no  Review of Systems       Patient denies fever, chills,  headache, sweats, rash, change in vision, change in hearing, chest pain, cough, nausea vomiting, urinary symptoms.  All other systems are reviewed and are negative.  Physical Exam  General:  patient is quite stable today. Head:  head is atraumatic. Eyes:  no xanthelasma. Neck:  no jugular venous extension. Chest Wall:  no chest wall tenderness. Lungs:  lungs are clear.  Respiratory effort is unlabored. Heart:  cardiac exam reveals S1-S2 no clicks or significant murmurs. Abdomen:  abdomen is soft. Msk:  no  musculoskeletal deformities. Extremities:  no peripheral edema. Skin:  no skin rashes. Psych:  patient is oriented to person time and place affect is normal.   Impression & Recommendations:  Problem # 1:  * GI BLEED  AVM Patient had a significant GI bleed.  She has been off aspirin.  We could consider using Plavix.  I'm not sure that this will make any difference relative to her chance of rebleeding from her AVM.  I am recommending that she be allowed to use heparin at the time of her dialysis.  She can be followed for 2 additional weeks.  If she has no signs of bleeding, restart aspirin at 81 mg every other day.  Problem # 2:  HYPERTENSION (ICD-401.9)  The following medications were removed from the medication list:    Aspirin 81 Mg Tbec (Aspirin) .Marland Kitchen... Take one tablet by mouth daily Blood pressure is controlled.  No change in therapy.  Problem # 3:  CORONARY ARTERY DISEASE (ICD-414.00)  The following medications were removed from the medication list:    Aspirin 81 Mg Tbec (Aspirin) .Marland Kitchen... Take one tablet by mouth daily Coronary disease is stable.  Despite very low hemoglobin she did not have chest pain.  This is reassuring.  She does not need any coronary testing at this time.  Problem # 4:  * CLEARANCE FOR RENAL TRANSPLANT The patient has been close to having a kidney available for transplant.  Her cardiac status is stable.  Her recent events with the GI bleed do not change her status from my viewpoint.  After careful review she is clear for renal transplant if the kidney is found.  Patient Instructions: 1)  Ok to get Heparin at Dialysis 2)  In 2 weeks restart Aspirin 81mg  every other day if no signs of bleeding. 3)  Your physician wants you to follow-up in:  6 months.  You will receive a reminder letter in the mail two months in advance. If you don't receive a letter, please call our office to schedule the follow-up appointment.

## 2010-12-28 NOTE — Miscellaneous (Signed)
  Clinical Lists Changes  Problems: Added new problem of * GI BLEED  AVM Observations: Added new observation of PAST MED HX: Anemia-NOS  /  GI bleed November 26, 2010.. hemoglobin 5.5.Marland KitchenMarland Kitchen transfused.. 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 hold aspirin 2-3 weeks... consider Plavix instead of aspirin..Dr. Ewing Schlein GI bleed.... November 26, 2010 Asthma CAD....catheterization... January, 2009.... grafts patent CABG...2003 EF 60%... echo... January, 2009 Diabetes mellitus, type II Gout Hyperlipidemia Hypertension Osteoporosis ESRD.Marland Kitchen dialysis ??? aspirin allergy  ??? tolerates low-dose aspirin Hyperparathyroidism Pneumonia    hospitalization... October, 2011 Nuclear... June of 14, 2011.... renal transplant assessment... prior inferior MI with mild peri-infarct ischemia... anterior breast attenuation.... EF 67%  (12/21/2010 9:27) Added new observation of PRIMARY MD: Romero Belling, MD (12/21/2010 9:27)       Past History:  Past Medical History: Anemia-NOS  /  GI bleed November 26, 2010.. hemoglobin 5.5.Marland KitchenMarland Kitchen transfused.. 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 hold aspirin 2-3 weeks... consider Plavix instead of aspirin..Dr. Ewing Schlein GI bleed.... November 26, 2010 Asthma CAD....catheterization... January, 2009.... grafts patent CABG...2003 EF 60%... echo... January, 2009 Diabetes mellitus, type II Gout Hyperlipidemia Hypertension Osteoporosis ESRD.Marland Kitchen dialysis ??? aspirin allergy  ??? tolerates low-dose aspirin Hyperparathyroidism Pneumonia    hospitalization... October, 2011 Nuclear... June of 14, 2011.... renal transplant assessment... prior inferior MI with mild peri-infarct ischemia... anterior breast attenuation.... EF 67%

## 2011-01-05 ENCOUNTER — Encounter: Payer: Self-pay | Admitting: Endocrinology

## 2011-01-17 NOTE — Letter (Signed)
Summary: [facility]  [facility]   Imported By: Sherian Rein 01/10/2011 09:50:20  _____________________________________________________________________  External Attachment:    Type:   Image     Comment:   External Document

## 2011-01-19 ENCOUNTER — Other Ambulatory Visit (HOSPITAL_COMMUNITY): Payer: Self-pay | Admitting: Nephrology

## 2011-01-19 DIAGNOSIS — N186 End stage renal disease: Secondary | ICD-10-CM

## 2011-01-31 ENCOUNTER — Encounter: Payer: Self-pay | Admitting: Endocrinology

## 2011-01-31 ENCOUNTER — Ambulatory Visit (HOSPITAL_COMMUNITY): Payer: Medicare Other

## 2011-01-31 LAB — CBC
HCT: 20.3 % — ABNORMAL LOW (ref 36.0–46.0)
HCT: 22.8 % — ABNORMAL LOW (ref 36.0–46.0)
HCT: 29.3 % — ABNORMAL LOW (ref 36.0–46.0)
Hemoglobin: 6.8 g/dL — CL (ref 12.0–15.0)
Hemoglobin: 7.8 g/dL — ABNORMAL LOW (ref 12.0–15.0)
Hemoglobin: 9.8 g/dL — ABNORMAL LOW (ref 12.0–15.0)
MCH: 27.9 pg (ref 26.0–34.0)
MCH: 28.3 pg (ref 26.0–34.0)
MCH: 29 pg (ref 26.0–34.0)
MCHC: 33.5 g/dL (ref 30.0–36.0)
MCHC: 33.8 g/dL (ref 30.0–36.0)
MCV: 83.8 fL (ref 78.0–100.0)
MCV: 84.6 fL (ref 78.0–100.0)
MCV: 84.9 fL (ref 78.0–100.0)
MCV: 85.4 fL (ref 78.0–100.0)
Platelets: 137 10*3/uL — ABNORMAL LOW (ref 150–400)
Platelets: 153 10*3/uL (ref 150–400)
Platelets: 166 10*3/uL (ref 150–400)
RBC: 2.67 MIL/uL — ABNORMAL LOW (ref 3.87–5.11)
RBC: 3.45 MIL/uL — ABNORMAL LOW (ref 3.87–5.11)
RBC: 3.51 MIL/uL — ABNORMAL LOW (ref 3.87–5.11)
RDW: 16.4 % — ABNORMAL HIGH (ref 11.5–15.5)
RDW: 17.3 % — ABNORMAL HIGH (ref 11.5–15.5)
RDW: 17.5 % — ABNORMAL HIGH (ref 11.5–15.5)
RDW: 17.6 % — ABNORMAL HIGH (ref 11.5–15.5)
WBC: 10.4 10*3/uL (ref 4.0–10.5)
WBC: 14.2 10*3/uL — ABNORMAL HIGH (ref 4.0–10.5)
WBC: 6.8 10*3/uL (ref 4.0–10.5)
WBC: 7.5 10*3/uL (ref 4.0–10.5)

## 2011-01-31 LAB — COMPREHENSIVE METABOLIC PANEL
ALT: 12 U/L (ref 0–35)
Albumin: 3.2 g/dL — ABNORMAL LOW (ref 3.5–5.2)
Alkaline Phosphatase: 48 U/L (ref 39–117)
Calcium: 9.5 mg/dL (ref 8.4–10.5)
GFR calc Af Amer: 16 mL/min — ABNORMAL LOW (ref >60)
Potassium: 2.8 mEq/L — ABNORMAL LOW (ref 3.5–5.1)
Sodium: 137 mEq/L (ref 135–145)
Total Protein: 6.7 g/dL (ref 6.0–8.3)

## 2011-01-31 LAB — CULTURE, BLOOD (ROUTINE X 2)
Culture  Setup Time: 201111050214
Culture  Setup Time: 201111060133
Culture: NO GROWTH
Culture: NO GROWTH

## 2011-01-31 LAB — CROSSMATCH
ABO/RH(D): O POS
Unit division: 0
Unit division: 0

## 2011-01-31 LAB — RENAL FUNCTION PANEL
Albumin: 2.7 g/dL — ABNORMAL LOW (ref 3.5–5.2)
BUN: 14 mg/dL (ref 6–23)
BUN: 21 mg/dL (ref 6–23)
BUN: 30 mg/dL — ABNORMAL HIGH (ref 6–23)
CO2: 23 mEq/L (ref 19–32)
CO2: 29 mEq/L (ref 19–32)
CO2: 30 mEq/L (ref 19–32)
Calcium: 8.7 mg/dL (ref 8.4–10.5)
Chloride: 102 mEq/L (ref 96–112)
Chloride: 102 mEq/L (ref 96–112)
Creatinine, Ser: 5.2 mg/dL — ABNORMAL HIGH (ref 0.4–1.2)
Creatinine, Ser: 7.1 mg/dL — ABNORMAL HIGH (ref 0.4–1.2)
GFR calc Af Amer: 10 mL/min — ABNORMAL LOW (ref >60)
GFR calc Af Amer: 7 mL/min — ABNORMAL LOW (ref >60)
GFR calc non Af Amer: 6 mL/min — ABNORMAL LOW (ref >60)
GFR calc non Af Amer: 8 mL/min — ABNORMAL LOW (ref >60)
Glucose, Bld: 135 mg/dL — ABNORMAL HIGH (ref 70–99)
Glucose, Bld: 90 mg/dL (ref 70–99)
Phosphorus: 1.4 mg/dL — ABNORMAL LOW (ref 2.3–4.6)
Phosphorus: 2.6 mg/dL (ref 2.3–4.6)
Phosphorus: 3.4 mg/dL (ref 2.3–4.6)
Potassium: 3.3 mEq/L — ABNORMAL LOW (ref 3.5–5.1)
Potassium: 3.6 mEq/L (ref 3.5–5.1)
Sodium: 139 mEq/L (ref 135–145)

## 2011-01-31 LAB — BASIC METABOLIC PANEL
CO2: 26 mEq/L (ref 19–32)
Calcium: 9.1 mg/dL (ref 8.4–10.5)
Creatinine, Ser: 8.08 mg/dL — ABNORMAL HIGH (ref 0.4–1.2)
GFR calc non Af Amer: 5 mL/min — ABNORMAL LOW (ref >60)
Glucose, Bld: 96 mg/dL (ref 70–99)
Sodium: 140 mEq/L (ref 135–145)

## 2011-01-31 LAB — GLUCOSE, CAPILLARY
Glucose-Capillary: 100 mg/dL — ABNORMAL HIGH (ref 70–99)
Glucose-Capillary: 101 mg/dL — ABNORMAL HIGH (ref 70–99)
Glucose-Capillary: 104 mg/dL — ABNORMAL HIGH (ref 70–99)
Glucose-Capillary: 105 mg/dL — ABNORMAL HIGH (ref 70–99)
Glucose-Capillary: 69 mg/dL — ABNORMAL LOW (ref 70–99)
Glucose-Capillary: 84 mg/dL (ref 70–99)
Glucose-Capillary: 85 mg/dL (ref 70–99)
Glucose-Capillary: 87 mg/dL (ref 70–99)
Glucose-Capillary: 91 mg/dL (ref 70–99)

## 2011-01-31 LAB — DIFFERENTIAL
Basophils Absolute: 0 10*3/uL (ref 0.0–0.1)
Eosinophils Relative: 0 % (ref 0–5)
Lymphocytes Relative: 6 % — ABNORMAL LOW (ref 12–46)

## 2011-01-31 LAB — MRSA PCR SCREENING: MRSA by PCR: NEGATIVE

## 2011-02-01 LAB — BASIC METABOLIC PANEL
BUN: 20 mg/dL (ref 6–23)
BUN: 16 mg/dL (ref 6–23)
CO2: 31 mEq/L (ref 19–32)
Calcium: 8.9 mg/dL (ref 8.4–10.5)
Calcium: 9.5 mg/dL (ref 8.4–10.5)
Chloride: 102 mEq/L (ref 96–112)
Creatinine, Ser: 4.52 mg/dL — ABNORMAL HIGH (ref 0.4–1.2)
Creatinine, Ser: 6.35 mg/dL — ABNORMAL HIGH (ref 0.4–1.2)
Creatinine, Ser: 5.85 mg/dL — ABNORMAL HIGH (ref 0.4–1.2)
GFR calc Af Amer: 12 mL/min — ABNORMAL LOW (ref >60)
GFR calc Af Amer: 8 mL/min — ABNORMAL LOW (ref >60)
GFR calc non Af Amer: 7 mL/min — ABNORMAL LOW (ref >60)
Glucose, Bld: 104 mg/dL — ABNORMAL HIGH (ref 70–99)
Potassium: 3.5 mEq/L (ref 3.5–5.1)
Sodium: 140 mEq/L (ref 135–145)
Sodium: 142 mEq/L (ref 135–145)

## 2011-02-01 LAB — CBC
HCT: 27.1 % — ABNORMAL LOW (ref 36.0–46.0)
HCT: 28.3 % — ABNORMAL LOW (ref 36.0–46.0)
HCT: 31.2 % — ABNORMAL LOW (ref 36.0–46.0)
Hemoglobin: 9.1 g/dL — ABNORMAL LOW (ref 12.0–15.0)
Hemoglobin: 6.8 g/dL — CL (ref 12.0–15.0)
Hemoglobin: 7.9 g/dL — ABNORMAL LOW (ref 12.0–15.0)
MCH: 27.9 pg (ref 26.0–34.0)
MCH: 28.5 pg (ref 26.0–34.0)
MCH: 28 pg (ref 26.0–34.0)
MCH: 28.5 pg (ref 26.0–34.0)
MCHC: 33.3 g/dL (ref 30.0–36.0)
MCHC: 34.3 g/dL (ref 30.0–36.0)
MCHC: 33.5 g/dL (ref 30.0–36.0)
MCV: 83.1 fL (ref 78.0–100.0)
MCV: 84.1 fL (ref 78.0–100.0)
MCV: 83.7 fL (ref 78.0–100.0)
Platelets: 178 10*3/uL (ref 150–400)
Platelets: 183 10*3/uL (ref 150–400)
Platelets: 196 10*3/uL (ref 150–400)
Platelets: 202 10*3/uL (ref 150–400)
Platelets: 220 10*3/uL (ref 150–400)
Platelets: 165 10*3/uL (ref 150–400)
RBC: 3.26 MIL/uL — ABNORMAL LOW (ref 3.87–5.11)
RBC: 3.65 MIL/uL — ABNORMAL LOW (ref 3.87–5.11)
RBC: 3.7 MIL/uL — ABNORMAL LOW (ref 3.87–5.11)
RBC: 2.39 MIL/uL — ABNORMAL LOW (ref 3.87–5.11)
RDW: 17.5 % — ABNORMAL HIGH (ref 11.5–15.5)
RDW: 17.7 % — ABNORMAL HIGH (ref 11.5–15.5)
RDW: 17.9 % — ABNORMAL HIGH (ref 11.5–15.5)
WBC: 10.2 10*3/uL (ref 4.0–10.5)
WBC: 10.5 10*3/uL (ref 4.0–10.5)
WBC: 8.6 10*3/uL (ref 4.0–10.5)
WBC: 8.7 10*3/uL (ref 4.0–10.5)

## 2011-02-01 LAB — CROSSMATCH
ABO/RH(D): O POS
Antibody Screen: NEGATIVE
Unit division: 0

## 2011-02-01 LAB — GLUCOSE, CAPILLARY
Glucose-Capillary: 102 mg/dL — ABNORMAL HIGH (ref 70–99)
Glucose-Capillary: 104 mg/dL — ABNORMAL HIGH (ref 70–99)
Glucose-Capillary: 108 mg/dL — ABNORMAL HIGH (ref 70–99)
Glucose-Capillary: 112 mg/dL — ABNORMAL HIGH (ref 70–99)
Glucose-Capillary: 68 mg/dL — ABNORMAL LOW (ref 70–99)
Glucose-Capillary: 83 mg/dL (ref 70–99)
Glucose-Capillary: 96 mg/dL (ref 70–99)
Glucose-Capillary: 96 mg/dL (ref 70–99)

## 2011-02-01 LAB — RENAL FUNCTION PANEL
Albumin: 2.2 g/dL — ABNORMAL LOW (ref 3.5–5.2)
Albumin: 2.3 g/dL — ABNORMAL LOW (ref 3.5–5.2)
Albumin: 2.8 g/dL — ABNORMAL LOW (ref 3.5–5.2)
BUN: 39 mg/dL — ABNORMAL HIGH (ref 6–23)
CO2: 27 mEq/L (ref 19–32)
CO2: 28 mEq/L (ref 19–32)
CO2: 28 mEq/L (ref 19–32)
Calcium: 9.1 mg/dL (ref 8.4–10.5)
Calcium: 9.6 mg/dL (ref 8.4–10.5)
Chloride: 102 mEq/L (ref 96–112)
Chloride: 102 mEq/L (ref 96–112)
Chloride: 99 mEq/L (ref 96–112)
Creatinine, Ser: 7.18 mg/dL — ABNORMAL HIGH (ref 0.4–1.2)
Creatinine, Ser: 9.98 mg/dL — ABNORMAL HIGH (ref 0.4–1.2)
GFR calc Af Amer: 12 mL/min — ABNORMAL LOW (ref >60)
GFR calc Af Amer: 5 mL/min — ABNORMAL LOW (ref >60)
GFR calc Af Amer: 7 mL/min — ABNORMAL LOW (ref >60)
GFR calc non Af Amer: 10 mL/min — ABNORMAL LOW (ref >60)
GFR calc non Af Amer: 4 mL/min — ABNORMAL LOW (ref >60)
GFR calc non Af Amer: 6 mL/min — ABNORMAL LOW (ref >60)
Glucose, Bld: 96 mg/dL (ref 70–99)
Phosphorus: 4.8 mg/dL — ABNORMAL HIGH (ref 2.3–4.6)
Phosphorus: 5 mg/dL — ABNORMAL HIGH (ref 2.3–4.6)
Potassium: 4 mEq/L (ref 3.5–5.1)
Sodium: 139 mEq/L (ref 135–145)
Sodium: 140 mEq/L (ref 135–145)
Sodium: 140 mEq/L (ref 135–145)

## 2011-02-01 LAB — PHOSPHORUS: Phosphorus: 2.6 mg/dL (ref 2.3–4.6)

## 2011-02-01 LAB — CARDIAC PANEL(CRET KIN+CKTOT+MB+TROPI)
CK, MB: 1.1 ng/mL (ref 0.3–4.0)
Relative Index: INVALID (ref 0.0–2.5)
Troponin I: 0.03 ng/mL (ref 0.00–0.06)
Troponin I: 0.03 ng/mL (ref 0.00–0.06)

## 2011-02-01 LAB — DIFFERENTIAL
Basophils Relative: 0 % (ref 0–1)
Eosinophils Absolute: 0.5 10*3/uL (ref 0.0–0.7)
Eosinophils Relative: 5 % (ref 0–5)
Lymphocytes Relative: 11 % — ABNORMAL LOW (ref 12–46)
Lymphs Abs: 1.1 10*3/uL (ref 0.7–4.0)
Monocytes Relative: 12 % (ref 3–12)
Monocytes Relative: 11 % (ref 3–12)
Neutrophils Relative %: 79 % — ABNORMAL HIGH (ref 43–77)

## 2011-02-01 LAB — CULTURE, BLOOD (ROUTINE X 2)
Culture  Setup Time: 201110150112
Culture: NO GROWTH
Culture: NO GROWTH

## 2011-02-01 LAB — HEMOCCULT GUIAC POC 1CARD (OFFICE)
Fecal Occult Bld: NEGATIVE
Fecal Occult Bld: POSITIVE

## 2011-02-01 LAB — COMPREHENSIVE METABOLIC PANEL
BUN: 23 mg/dL (ref 6–23)
CO2: 32 mEq/L (ref 19–32)
Chloride: 101 mEq/L (ref 96–112)
Creatinine, Ser: 7.15 mg/dL — ABNORMAL HIGH (ref 0.4–1.2)
GFR calc non Af Amer: 6 mL/min — ABNORMAL LOW (ref >60)
Glucose, Bld: 98 mg/dL (ref 70–99)
Total Bilirubin: 0.7 mg/dL (ref 0.3–1.2)

## 2011-02-01 LAB — CK TOTAL AND CKMB (NOT AT ARMC)
CK, MB: 0.8 ng/mL (ref 0.3–4.0)
CK, MB: 1 ng/mL (ref 0.3–4.0)
CK, MB: 1.5 ng/mL (ref 0.3–4.0)
Relative Index: INVALID (ref 0.0–2.5)

## 2011-02-01 LAB — LACTATE DEHYDROGENASE: LDH: 190 U/L (ref 94–250)

## 2011-02-01 LAB — TECHNOLOGIST SMEAR REVIEW

## 2011-02-03 ENCOUNTER — Emergency Department (HOSPITAL_COMMUNITY): Payer: Medicare Other

## 2011-02-03 ENCOUNTER — Emergency Department (HOSPITAL_COMMUNITY)
Admission: EM | Admit: 2011-02-03 | Discharge: 2011-02-03 | Disposition: A | Discharge status: Home / Self Care | Payer: Medicare Other | Attending: Emergency Medicine | Admitting: Emergency Medicine

## 2011-02-03 DIAGNOSIS — E119 Type 2 diabetes mellitus without complications: Secondary | ICD-10-CM | POA: Insufficient documentation

## 2011-02-03 DIAGNOSIS — N189 Chronic kidney disease, unspecified: Secondary | ICD-10-CM | POA: Insufficient documentation

## 2011-02-03 DIAGNOSIS — W010XXA Fall on same level from slipping, tripping and stumbling without subsequent striking against object, initial encounter: Secondary | ICD-10-CM | POA: Insufficient documentation

## 2011-02-03 DIAGNOSIS — M25569 Pain in unspecified knee: Secondary | ICD-10-CM | POA: Insufficient documentation

## 2011-02-03 DIAGNOSIS — E785 Hyperlipidemia, unspecified: Secondary | ICD-10-CM | POA: Insufficient documentation

## 2011-02-03 DIAGNOSIS — J45909 Unspecified asthma, uncomplicated: Secondary | ICD-10-CM | POA: Insufficient documentation

## 2011-02-03 DIAGNOSIS — I252 Old myocardial infarction: Secondary | ICD-10-CM | POA: Insufficient documentation

## 2011-02-03 DIAGNOSIS — M79609 Pain in unspecified limb: Secondary | ICD-10-CM | POA: Insufficient documentation

## 2011-02-03 DIAGNOSIS — M25519 Pain in unspecified shoulder: Secondary | ICD-10-CM | POA: Insufficient documentation

## 2011-02-03 DIAGNOSIS — Z992 Dependence on renal dialysis: Secondary | ICD-10-CM | POA: Insufficient documentation

## 2011-02-03 DIAGNOSIS — IMO0002 Reserved for concepts with insufficient information to code with codable children: Secondary | ICD-10-CM | POA: Insufficient documentation

## 2011-02-03 DIAGNOSIS — Y92009 Unspecified place in unspecified non-institutional (private) residence as the place of occurrence of the external cause: Secondary | ICD-10-CM | POA: Insufficient documentation

## 2011-02-03 DIAGNOSIS — Z8639 Personal history of other endocrine, nutritional and metabolic disease: Secondary | ICD-10-CM | POA: Insufficient documentation

## 2011-02-03 DIAGNOSIS — Z862 Personal history of diseases of the blood and blood-forming organs and certain disorders involving the immune mechanism: Secondary | ICD-10-CM | POA: Insufficient documentation

## 2011-02-03 DIAGNOSIS — I509 Heart failure, unspecified: Secondary | ICD-10-CM | POA: Insufficient documentation

## 2011-02-03 DIAGNOSIS — Z951 Presence of aortocoronary bypass graft: Secondary | ICD-10-CM | POA: Insufficient documentation

## 2011-02-03 DIAGNOSIS — D649 Anemia, unspecified: Secondary | ICD-10-CM | POA: Insufficient documentation

## 2011-02-03 DIAGNOSIS — I2510 Atherosclerotic heart disease of native coronary artery without angina pectoris: Secondary | ICD-10-CM | POA: Insufficient documentation

## 2011-02-03 DIAGNOSIS — I129 Hypertensive chronic kidney disease with stage 1 through stage 4 chronic kidney disease, or unspecified chronic kidney disease: Secondary | ICD-10-CM | POA: Insufficient documentation

## 2011-02-03 LAB — CBC
HCT: 27.8 % — ABNORMAL LOW (ref 36.0–46.0)
MCH: 27 pg (ref 26.0–34.0)
MCV: 79.9 fL (ref 78.0–100.0)
Platelets: 113 10*3/uL — ABNORMAL LOW (ref 150–400)
RBC: 3.48 MIL/uL — ABNORMAL LOW (ref 3.87–5.11)

## 2011-02-03 LAB — BASIC METABOLIC PANEL
BUN: 47 mg/dL — ABNORMAL HIGH (ref 6–23)
Chloride: 99 mEq/L (ref 96–112)
Creatinine, Ser: 10.78 mg/dL — ABNORMAL HIGH (ref 0.4–1.2)
Glucose, Bld: 88 mg/dL (ref 70–99)

## 2011-02-07 NOTE — Letter (Signed)
Summary: [name]  [facility]   Imported By: Lennie Odor 02/02/2011 14:12:58  _____________________________________________________________________  External Attachment:    Type:   Image     Comment:   External Document

## 2011-02-09 ENCOUNTER — Other Ambulatory Visit (HOSPITAL_COMMUNITY): Payer: Self-pay | Admitting: Nephrology

## 2011-02-09 ENCOUNTER — Ambulatory Visit (HOSPITAL_COMMUNITY)
Admission: RE | Admit: 2011-02-09 | Discharge: 2011-02-09 | Disposition: A | Discharge status: Home / Self Care | Payer: Medicare Other | Source: Ambulatory Visit | Attending: Nephrology | Admitting: Nephrology

## 2011-02-09 ENCOUNTER — Ambulatory Visit (HOSPITAL_COMMUNITY): Payer: Medicare Other

## 2011-02-09 DIAGNOSIS — T82898A Other specified complication of vascular prosthetic devices, implants and grafts, initial encounter: Secondary | ICD-10-CM | POA: Insufficient documentation

## 2011-02-09 DIAGNOSIS — N186 End stage renal disease: Secondary | ICD-10-CM | POA: Insufficient documentation

## 2011-02-09 DIAGNOSIS — Y832 Surgical operation with anastomosis, bypass or graft as the cause of abnormal reaction of the patient, or of later complication, without mention of misadventure at the time of the procedure: Secondary | ICD-10-CM | POA: Insufficient documentation

## 2011-02-09 MED ORDER — IOHEXOL 300 MG/ML  SOLN
100.0000 mL | Freq: Once | INTRAMUSCULAR | Status: AC | PRN
  Administered 2011-02-09: 35 mL via INTRAVENOUS

## 2011-02-21 LAB — POCT I-STAT, CHEM 8
Calcium, Ion: 1.04 mmol/L — ABNORMAL LOW (ref 1.12–1.32)
Glucose, Bld: 118 mg/dL — ABNORMAL HIGH (ref 70–99)
HCT: 21 % — ABNORMAL LOW (ref 36.0–46.0)
Hemoglobin: 7.1 g/dL — ABNORMAL LOW (ref 12.0–15.0)

## 2011-02-21 LAB — CBC
Hemoglobin: 12.3 g/dL (ref 12.0–15.0)
MCHC: 33.5 g/dL (ref 30.0–36.0)
MCHC: 33.5 g/dL (ref 30.0–36.0)
MCHC: 34.1 g/dL (ref 30.0–36.0)
MCHC: 34.4 g/dL (ref 30.0–36.0)
MCHC: 34.4 g/dL (ref 30.0–36.0)
MCHC: 34.4 g/dL (ref 30.0–36.0)
MCV: 85.1 fL (ref 78.0–100.0)
MCV: 86.8 fL (ref 78.0–100.0)
MCV: 88.3 fL (ref 78.0–100.0)
Platelets: 121 10*3/uL — ABNORMAL LOW (ref 150–400)
Platelets: 125 10*3/uL — ABNORMAL LOW (ref 150–400)
Platelets: 129 10*3/uL — ABNORMAL LOW (ref 150–400)
Platelets: 164 10*3/uL (ref 150–400)
RBC: 2.64 MIL/uL — ABNORMAL LOW (ref 3.87–5.11)
RBC: 2.92 MIL/uL — ABNORMAL LOW (ref 3.87–5.11)
RBC: 3.99 MIL/uL (ref 3.87–5.11)
RBC: 4.1 MIL/uL (ref 3.87–5.11)
RDW: 16.6 % — ABNORMAL HIGH (ref 11.5–15.5)
RDW: 16.9 % — ABNORMAL HIGH (ref 11.5–15.5)
RDW: 17.1 % — ABNORMAL HIGH (ref 11.5–15.5)
RDW: 18.5 % — ABNORMAL HIGH (ref 11.5–15.5)
WBC: 5.5 10*3/uL (ref 4.0–10.5)
WBC: 8.4 10*3/uL (ref 4.0–10.5)
WBC: 8.5 10*3/uL (ref 4.0–10.5)

## 2011-02-21 LAB — CROSSMATCH
ABO/RH(D): O POS
Antibody Screen: NEGATIVE

## 2011-02-21 LAB — HEMOGLOBIN AND HEMATOCRIT, BLOOD
HCT: 36.2 % (ref 36.0–46.0)
HCT: 36.3 % (ref 36.0–46.0)
Hemoglobin: 12.8 g/dL (ref 12.0–15.0)

## 2011-02-21 LAB — BASIC METABOLIC PANEL
BUN: 18 mg/dL (ref 6–23)
BUN: 24 mg/dL — ABNORMAL HIGH (ref 6–23)
BUN: 45 mg/dL — ABNORMAL HIGH (ref 6–23)
CO2: 26 mEq/L (ref 19–32)
CO2: 27 mEq/L (ref 19–32)
CO2: 27 mEq/L (ref 19–32)
CO2: 28 mEq/L (ref 19–32)
CO2: 29 mEq/L (ref 19–32)
Calcium: 8.9 mg/dL (ref 8.4–10.5)
Calcium: 9 mg/dL (ref 8.4–10.5)
Calcium: 9 mg/dL (ref 8.4–10.5)
Calcium: 9.1 mg/dL (ref 8.4–10.5)
Chloride: 98 mEq/L (ref 96–112)
Creatinine, Ser: 6.01 mg/dL — ABNORMAL HIGH (ref 0.4–1.2)
Creatinine, Ser: 6.32 mg/dL — ABNORMAL HIGH (ref 0.4–1.2)
Creatinine, Ser: 7.52 mg/dL — ABNORMAL HIGH (ref 0.4–1.2)
Creatinine, Ser: 8.8 mg/dL — ABNORMAL HIGH (ref 0.4–1.2)
Creatinine, Ser: 9.72 mg/dL — ABNORMAL HIGH (ref 0.4–1.2)
GFR calc Af Amer: 5 mL/min — ABNORMAL LOW (ref >60)
GFR calc Af Amer: 7 mL/min — ABNORMAL LOW (ref >60)
GFR calc Af Amer: 8 mL/min — ABNORMAL LOW (ref >60)
GFR calc non Af Amer: 5 mL/min — ABNORMAL LOW (ref >60)
Glucose, Bld: 92 mg/dL (ref 70–99)
Sodium: 137 mEq/L (ref 135–145)

## 2011-02-21 LAB — DIFFERENTIAL
Basophils Absolute: 0 10*3/uL (ref 0.0–0.1)
Lymphs Abs: 1.7 10*3/uL (ref 0.7–4.0)
Monocytes Absolute: 1 10*3/uL (ref 0.1–1.0)
Monocytes Relative: 12 % (ref 3–12)

## 2011-02-21 LAB — IRON AND TIBC
Iron: 148 ug/dL — ABNORMAL HIGH (ref 42–135)
Iron: 246 ug/dL — ABNORMAL HIGH (ref 42–135)
Saturation Ratios: 57 % — ABNORMAL HIGH (ref 20–55)
TIBC: 258 ug/dL (ref 250–470)

## 2011-02-21 LAB — GLUCOSE, CAPILLARY
Glucose-Capillary: 87 mg/dL (ref 70–99)
Glucose-Capillary: 92 mg/dL (ref 70–99)

## 2011-02-21 LAB — LIPID PANEL
Cholesterol: 116 mg/dL (ref 0–200)
LDL Cholesterol: 61 mg/dL (ref 0–99)
Triglycerides: 68 mg/dL (ref <150)

## 2011-02-21 LAB — RENAL FUNCTION PANEL
Albumin: 3.1 g/dL — ABNORMAL LOW (ref 3.5–5.2)
BUN: 67 mg/dL — ABNORMAL HIGH (ref 6–23)
Chloride: 102 mEq/L (ref 96–112)
Glucose, Bld: 95 mg/dL (ref 70–99)
Potassium: 4.1 mEq/L (ref 3.5–5.1)

## 2011-02-21 LAB — CARDIAC PANEL(CRET KIN+CKTOT+MB+TROPI)
CK, MB: 0.8 ng/mL (ref 0.3–4.0)
Relative Index: INVALID (ref 0.0–2.5)
Total CK: 85 U/L (ref 7–177)
Total CK: 87 U/L (ref 7–177)
Troponin I: 0.02 ng/mL (ref 0.00–0.06)

## 2011-02-21 LAB — FOLATE: Folate: 12.6 ng/mL

## 2011-02-21 LAB — POCT CARDIAC MARKERS: CKMB, poc: <1 ng/mL — ABNORMAL LOW (ref 1.0–8.0)

## 2011-02-21 LAB — HEMOCCULT GUIAC POC 1CARD (OFFICE)
Fecal Occult Bld: POSITIVE
Fecal Occult Bld: POSITIVE

## 2011-02-21 LAB — TROPONIN I: Troponin I: 0.01 ng/mL (ref 0.00–0.06)

## 2011-02-21 LAB — FERRITIN: Ferritin: 846 ng/mL — ABNORMAL HIGH (ref 10–291)

## 2011-02-21 LAB — PROTEIN ELECTROPHORESIS, SERUM
Albumin ELP: 53.6 % — ABNORMAL LOW (ref 55.8–66.1)
Alpha-1-Globulin: 5.6 % — ABNORMAL HIGH (ref 2.9–4.9)
Alpha-2-Globulin: 18.1 % — ABNORMAL HIGH (ref 7.1–11.8)
Beta Globulin: 4.6 % — ABNORMAL LOW (ref 4.7–7.2)
Total Protein ELP: 5.9 g/dL — ABNORMAL LOW (ref 6.0–8.3)

## 2011-02-21 LAB — PHOSPHORUS: Phosphorus: 4 mg/dL (ref 2.3–4.6)

## 2011-02-21 LAB — CK TOTAL AND CKMB (NOT AT ARMC): CK, MB: 0.6 ng/mL (ref 0.3–4.0)

## 2011-02-21 LAB — D-DIMER, QUANTITATIVE: D-Dimer, Quant: 1.3 ug/mL-FEU — ABNORMAL HIGH (ref 0.00–0.48)

## 2011-02-21 LAB — ABO/RH: ABO/RH(D): O POS

## 2011-02-21 LAB — ALBUMIN: Albumin: 2.8 g/dL — ABNORMAL LOW (ref 3.5–5.2)

## 2011-02-24 LAB — DIFFERENTIAL
Basophils Relative: 0 % (ref 0–1)
Eosinophils Absolute: 0.2 10*3/uL (ref 0.0–0.7)
Lymphocytes Relative: 20 % (ref 12–46)
Monocytes Relative: 12 % (ref 3–12)
Neutro Abs: 4.1 10*3/uL (ref 1.7–7.7)
Neutrophils Relative %: 65 % (ref 43–77)

## 2011-02-24 LAB — BASIC METABOLIC PANEL
Calcium: 9.1 mg/dL (ref 8.4–10.5)
GFR calc Af Amer: 14 mL/min — ABNORMAL LOW (ref >60)
GFR calc non Af Amer: 12 mL/min — ABNORMAL LOW (ref >60)
Glucose, Bld: 82 mg/dL (ref 70–99)
Potassium: 2.7 mEq/L — CL (ref 3.5–5.1)
Sodium: 139 mEq/L (ref 135–145)

## 2011-02-24 LAB — HEMOCCULT GUIAC POC 1CARD (OFFICE): Fecal Occult Bld: POSITIVE

## 2011-02-24 LAB — CBC
HCT: 33.2 % — ABNORMAL LOW (ref 36.0–46.0)
Hemoglobin: 11 g/dL — ABNORMAL LOW (ref 12.0–15.0)
MCHC: 33.2 g/dL (ref 30.0–36.0)
Platelets: 184 10*3/uL (ref 150–400)
RDW: 23.5 % — ABNORMAL HIGH (ref 11.5–15.5)
WBC: 6.4 10*3/uL (ref 4.0–10.5)

## 2011-04-04 NOTE — Assessment & Plan Note (Signed)
[facility]                            CARDIOLOGY OFFICE NOTE   [name], [name]                     MRN:          [id number]  DATE:12/24/2008                            DOB:          08/19/1941    [name] is seen for cardiology followup.  She has known coronary  disease.  She is post CABG.  She underwent cardiac catheterization in  January 2009 showing that her grafts were patent.  She is not having any  chest pain.  There is no shortness of breath or syncope.  She is on  dialysis.  She is going about full activities.   PAST MEDICAL HISTORY:   ALLERGIES:  She does not tolerate ASPIRIN well, but she does manage with  low-dose ASPIRIN.   MEDICATIONS:  See the flow sheet.   REVIEW OF SYSTEMS:  She is not having any GI or GU symptoms.  She has no  headaches or eye problems.  There are no fevers, or chills, or skin  rashes.  Her review of systems is negative.   PHYSICAL EXAMINATION:  VITAL SIGNS:  Blood pressure is 105/67.  Pulse is  90.  GENERAL:  The patient is oriented to person, time, and place.  Affect is  normal.  HEENT:  Reveals no xanthelasma.  She has normal extraocular motion.  NECK:  There are no carotid bruits.  There is no jugular venous  distention.  LUNGS:  Clear.  Respiratory effort is not labored.  CARDIAC:  Reveals S1 with an S2.  There are no clicks or significant  murmurs.  ABDOMEN:  Soft.  EXTREMITIES:  She has no peripheral edema.   Problems include those listed on the note of December 20, 2007.  Her  coronary disease is stable.  Her blood pressure is controlled.  Lipids  are being aggressively treated.  She does not need any further workup at  this time.  I will see her back in 1 year for cardiology followup.     Luis Abed, MD, [facility]  Electronically Signed    JDK/MedQ  DD: 12/24/2008  DT: 12/24/2008  Job #: [id number]   cc:   Gregary Signs A. Everardo All, MD

## 2011-04-04 NOTE — H&P (Signed)
[name], [name] NO.:  [id number]   MEDICAL RECORD NO.:  [id number]          PATIENT TYPE:  INP   LOCATION:  6527                         FACILITY:  [facility]   PHYSICIAN:  Pricilla Riffle, MD, FACCDATE OF BIRTH:  Oct 13, 1941   DATE OF ADMISSION:  12/02/2007  DATE OF DISCHARGE:                              HISTORY & PHYSICAL   PRIMARY CARE PHYSICIAN:  Dr. Everardo All.   PRIMARY CARDIOLOGIST:  Dr. Rollene Rotunda.   CHIEF COMPLAINT:  Chest pain.   HISTORY OF PRESENT ILLNESS:  [name] is a 70 year old female with a  history of coronary artery disease.  Yesterday at church, she stated she  was not doing anything strenuous but had onset of substernal chest pain  she describes as a pressure, and it reached an 08/10.  It was associated  with shortness of breath.  She did not note any aggravating or  alleviating symptoms.  She did not try any medications.  She feels that  lasted about three hours and gradually eased off.  Today she got up and  was moving around when she had chest pain again.  She went to  hemodialysis as scheduled, but she was still having chest pain at an  8/10 and there was associated shortness of breath and nausea, but no  diaphoresis.  She was treated at the dialysis center with sublingual  nitroglycerin times one, which relieved her pain.  EMS was called and  she had recurrent chest pain with EMS and was given baby aspirin, but  not more nitroglycerin.  In the emergency room, she did not have IV  access and so was not given nitroglycerin, but she received  nitroglycerin paste 1 inch and this did relieve her chest pain.  Her  pain was slightly different with deep inspiration and she stated it  radiated through to her back.  He stated her symptoms were like her pre-  bypass symptoms.  Currently she is resting comfortably.   PAST MEDICAL HISTORY:  1. Non-ST segment elevation MI 2003 with subsequent bypass surgery.  2. Aortocoronary bypass surgery  in 2003 with LIMA to LAD, SVG to D1,      SVG to circumflex, SVG to PDA.  3. Status post echocardiogram in 2004 showing an EF of 55-65%.  4. Status post Myoview for surgical clearance in May 2008 showing      overall left ventricular function normal with an EF 66%, a      decreased activity in the inferior wall, suggests some scar, but no      definite ischemia.  5. Hypertension.  6. Hyperlipidemia.  7. Family history of coronary artery disease.  8. Diabetes.  9. End-stage renal disease, on hemodialysis since November 2008.  10.Anemia of chronic disease.  11.Gout.  12.History of asthma.  13.History of pneumonia in 2005.  14.Remote history of thrombocytopenia.  15.Secondary hyperparathyroidism.  16.History of angioedema with ACE inhibitors.  17.Reflux esophagitis.  18.History of myositis with statins.   SURGICAL HISTORY:  She is status post rotator cuff repair, hysterectomy,  cardiac catheterization, bypass surgery, breast biopsy and left forearm  AV graft with subsequent thrombectomy.   ALLERGIES:  SHE IS ALLERGIC OR INTOLERANT TO STATINS WITH MYOSITIS.   CURRENT MEDICATIONS:  1. He hydrocodone 5/500 p.r.n.  2. Zofran p.r.n.  3. Vytorin 10/80 daily.  4. Vanceril b.i.d.  5. Sensipar every other day.  6. Prilosec 20 daily.  7. Serevent two puffs daily.  8. Aspirin 325 mg daily.  9. Allopurinol 300 mg daily.  10.Actos 45 mg daily.   SOCIAL HISTORY:  She lives in [place] alone and works at  hemodialysis as an Geophysicist/field seismologist.  She has approximately 30 pack-year  history of tobacco use, but quit 15 years ago and does not abuse alcohol  or drugs.   FAMILY HISTORY:  Her mother died at age 46 with a history of coronary  artery disease and his father died in his 30s from a motor vehicle  accident with no significant medical history.  She has no siblings with  coronary artery disease.   REVIEW OF SYSTEMS:  She has had no fevers or chills.  She describes  chronic dyspnea on  exertion and states that she might get leg aches when  she walks more than usual, but states that she really does not exert  herself very much.  She had nausea today, but generally her reflux  symptoms are well-controlled and there is no hematemesis, hemoptysis or  melena.  She has had no fevers or chills.  She has occasional edema, but  not significant, and feels that her weight is fairly well controlled.  She is compliant with a diabetic renal diet.  Full 14-point review of  systems is otherwise negative.   PHYSICAL EXAM:  VITAL SIGNS:  Temperature is 97.8, blood pressure  132/62, pulse 68, respiratory 20, O2 saturation is 97% on room air.  GENERAL:  She is a well-developed, well-nourished African-American  female in no distress, __________ pain free.  HEENT: Is normal.  NECK:  There is no lymphadenopathy, thyromegaly, bruit or JVD noted.  CV:  Heart is regular rate and rhythm with an S1-S2 and no significant  murmur, rub or gallop is noted.  Distal pulses are intact, although her  left radial is slightly decreased.  She has bilateral femoral bruits  noted.  No rub is noted on cardiac exam.  LUNGS:  She has very few rales in the bases.  SKIN:  No rashes or lesions are noted.  ABDOMEN:  Soft and nontender with active bowel sounds and no  hepatosplenomegaly is noted.  EXTREMITIES:  There is no cyanosis,  clubbing or edema noted.  MUSCULOSKELETAL:  With deep inspiration, she has a pulling on the right  side of her chest that is different.  There is no joint deformity or  effusion and no spine or CVA tenderness.  NEURO:  She is alert and oriented.  Cranial nerves II-XII grossly  intact.   Chest X-Ray shows mild cardiomegaly, but no acute disease.   EKG is sinus rhythm, rate 65 and no acute ischemic changes.   Laboratory values are pending, but initial point-of-care markers show a  myoglobin slightly elevated 205 with a troponin MD negative.   IMPRESSION:  [name] is a  70 year old female with a history of  coronary artery disease and bypass surgery in 2003.  Yesterday and today  she had episodes of chest pain.  Yesterday the pain started at church  and was on and off for a few hours.  She felt better sitting up to sleep  last night.  This a.m., she took a shower, got dressed and had recurrent  chest pain.  Her chest pain is not clearly pleuritic and is not  positional.  It is not like her reflux symptoms.  At the dialysis  center, she received nitroglycerin but dialysis was not done because of  her chest pain.  Her weight is up 2.2 kg, which is about usual for her.  On physical exam, there is no rub and her chest wall is not tender.  She  has some pleuritic chest pain,  but it is different than the chest pain  for which she came to the ER.  Her volume does not appear significantly  increased.  Based on a history of coronary artery disease, cardiac  catheterization is recommended to define her anatomy.  We will cycle  cardiac enzymes and try to keep her pain-free with IV nitroglycerin and  heparin.  We will increase her Prilosec to Protonix with the  anticoagulation from heparin.  We will continue aspirin and her other  home medications and check a lipid profile in the morning.  Dr.  Arrie Aran has been made aware of her admission and will manage her  dialysis.      Theodore Demark, PA-C      Pricilla Riffle, MD, [facility]  Electronically Signed    RB/MEDQ  D:  12/02/2007  T:  12/03/2007  Job:  [id number]   cc:   Gregary Signs A. Everardo All, MD

## 2011-04-04 NOTE — Cardiovascular Report (Signed)
NAMEJAYLIANA, VALENCIA              ACCOUNT NO.:  [id number]   MEDICAL RECORD NO.:  [id number]          PATIENT TYPE:  INP   LOCATION:  6527                         FACILITY:  [facility]   PHYSICIAN:  Bruce R. Juanda Chance, MD, FACCDATE OF BIRTH:  October 24, 1941   DATE OF PROCEDURE:  12/04/2007  DATE OF DISCHARGE:                            CARDIAC CATHETERIZATION   PRIMARY CARE PHYSICIAN:  Sean A. Everardo All, MD   CLINICAL HISTORY:  Mrs. Goshorn is 70 years old and had bypass surgery  in 2003 and has end-stage renal disease and has been on hemodialysis  since November 2008.  She also has a history of reflux, and diabetes and  she has good LV function.  She is admitted to the hospital with chest  pain concerning for unstable angina.  Scheduled for evaluation with  angiography.  Her markers were negative.   PROCEDURE:  The procedure was performed via the right femoral using  arterial sheath and 5-French preformed coronary catheters.  A front wall  arterial puncture was performed, and Omnipaque contrast was used.  We  used left bypass graft catheter for injection of the diagonal and  circumflex grafts, and then we used an AL-1 4-French to further evaluate  the ostium of the graft to the diagonal branch of the LAD.  The patient  tolerated the procedure well and left the laboratory in satisfactory  condition.   RESULTS:  Left main coronary artery:  The left main coronary artery had  a 40% distal stenosis.   Left anterior descending artery:  Left anterior descending artery gave  rise to three diagonal branches and two septal perforators.  There was  50-80% narrowing in the proximal LAD.  There was total occlusion of the  second diagonal branch of the LAD.   Circumflex artery:  The circumflex artery was completely occluded after  a ramus branch.   Right coronary artery:  The right coronary artery was completely  occluded at its mid portion.   The saphenous vein graft to posterior descending branch  was patent and  functioned normally.  This filled the posterior descending branch and a  posterolateral branch.   The saphenous vein graft to the marginal branch of the circumflex artery  was patent and functioned well.   The saphenous vein graft to the diagonal branch of the LAD filled a  small vessel but was patent and functioned well.   The LIMA graft to LAD was patent and functioned normally.   Left ventriculogram:  The left ventriculogram performed in the RAO  projection showed good wall motion with no areas of hypokinesis.  Estimated ejection fraction was 6%.   The aortic pressure was 141/67 with mean of 99. Left ventricular  pressure was 140/14.   CONCLUSION:  1. Coronary artery disease status post coronary bypass graft surgery      in 2003.  2. Severe native vessel disease with 40% narrowing in the distal left      main, 80% narrowing in the proximal left anterior descending with      total occlusion of the second diagonal branch and total occlusion  of circumflex and right coronary arteries.  3. Patent vein graft to posterior descending branch of the right      coronary artery, patent vein graft to marginal branch of circumflex      artery, patent vein graft to diagonal branch of the left anterior      descending, and patent left internal mammary artery graft to the      left anterior descending.  4. Normal left ventricular function with estimated ejection fraction      of 60%.   RECOMMENDATIONS:  All the grafts are patent, and there is no source of  ischemia.  In view of these findings, I do not think the patient's  symptoms are related to her heart.  They may be related to reflux and  will intensify her treatment for that and plan discharge later today.      Bruce Elvera Lennox Juanda Chance, MD, [facility]  Electronically Signed     BRB/MEDQ  D:  12/04/2007  T:  12/04/2007  Job:  [id number]   cc:   Gregary Signs A. Everardo All, MD  Luis Abed, MD, [facility]  Renal service  Rollene Rotunda,  MD, [facility]  Cardiopulmonary Lab

## 2011-04-04 NOTE — Discharge Summary (Signed)
[name], [name]              ACCOUNT NO.:  [id number]   MEDICAL RECORD NO.:  [id number]          PATIENT TYPE:  INP   LOCATION:  6527                         FACILITY:  [facility]   PHYSICIAN:  Bruce R. Juanda Chance, MD, FACCDATE OF BIRTH:  11-13-1941   DATE OF ADMISSION:  12/02/2007  DATE OF DISCHARGE:  12/04/2007                               DISCHARGE SUMMARY   PRIMARY CARDIOLOGIST:  Luis Abed, MD.   PRIMARY CARE Holland Nickson:  Cleophas Dunker. Everardo All, MD.   DISCHARGE DIAGNOSIS:  Chest pain.   SECONDARY DIAGNOSES:  1. Coronary artery disease status post coronary artery bypass grafting      x4 in 2003.  2. Hypertension.  3. Hyperlipidemia.  4. Type 2 diabetes mellitus.  5. End-stage renal disease on Monday, Wednesday, and Friday dialysis      since November of 2008.  6. Anemia of chronic disease.  7. Gout.  8. Asthma.  9. Secondary hyperparathyroidism.  10.History of angioedema with ACE inhibitors.  11.Gastroesophageal reflux disease/reflux esophagitis.  12.History of myositis with statins.   ALLERGIES:  1. ACE INHIBITORS CAUSE ANGIOEDEMA.  2. STATINS CAUSE MYOSITIS.   PROCEDURES:  Left heart cardiac catheterization.   HISTORY OF PRESENT ILLNESS:  This is a 70 year old, African-American  female with the above-complications, who presented to the [facility] ED,  December 02, 2007, secondary to chest discomfort with associated  shortness of breath.  She was admitted for further evaluation.   HOSPITAL COURSE:  The patient ruled out for MI and underwent left heart  cardiac catheterization on December 04, 2007.  Catheterization revealed  four of four patent grafts with native multivessel disease and no target  for intervention.  The EF was 60%.  It was felt that her symptoms may  likely be GI in nature.  She has not received dialysis throughout her  hospitalization nor has undergone dialysis post catheterization so that  she may resume a Monday, Wednesday, Friday schedule following  discharge.  She is being discharged home today in good condition.   DISCHARGE LABORATORY DATA:  Hemoglobin 13.9, hematocrit 42.8, WBC 7.1,  platelets 177, sodium 140, potassium 4.2, chloride 103, CO2 28, BUN 25,  creatinine 3.10, glucose 76, troponin 0.02.   DISPOSITION:  The patient is being discharged home today in good  condition.   FOLLOWUP APPOINTMENTS:  She is to follow up with Dr. Willa Rough on  January 30th at 12 p.m.  She is to follow up with Dr. Everardo All as  previously scheduled as well as follow up with Monday, Wednesday, Friday  dialysis.   DISCHARGE MEDICATIONS:  1. Aspirin 325 mg daily.  2. Vytorin 10/80 mg q.h.s.  3. Actos 45 mg daily.  4. Allopurinol 300 mg daily.  5. Prilosec 20 mg daily.  6. Sensipar 600 mg every other day.  7. Serevent two puffs daily.  8. Vanceril two puffs b.i.d.  9. Hydrocodone as previously prescribed.  10.Zofran 4 mg b.i.d. p.r.n. __________.  11.Nitrostat 0.4 mg one sublingual p.r.n. chest pain.   LENGTH OF DISCHARGE ENCOUNTER:  40 minutes including physician time.      Nicolasa Ducking,  ANP      Bruce R. Juanda Chance, MD, [facility]  Electronically Signed    CB/MEDQ  D:  12/04/2007  T:  12/04/2007  Job:  [id number]   cc:   Gregary Signs A. Everardo All, MD

## 2011-04-04 NOTE — Assessment & Plan Note (Signed)
[facility]                            CARDIOLOGY OFFICE NOTE   DEMARIE, UHLIG                     MRN:          [id number]  DATE:12/20/2007                            DOB:          1941/01/25    [name] has coronary disease.  Recently she was admitted with some a  chest discomfort.  It was felt that it was appropriate to proceed with  heart catheterization.  This was done on December 04, 2007.  Her graft  was patent.  It was felt that her symptoms most probably were not  cardiac.  Could be related to reflux.  She was discharged.  Since being  at home.  She is been stable and she has not had recurrent symptoms.   PAST MEDICAL HISTORY:   ALLERGIES:  LIPITOR and LACTULOSE.   MEDICATIONS:  Vytorin, Actos, allopurinol, furosemide, Sensipar,  aspirin, Vanceril inhaler, Serevent inhaler, albuterol inhaler, Biotin,  Dialyvite.   OTHER MEDICAL PROBLEMS:  See the list below.   REVIEW OF SYSTEMS:  She is stable today and she is feeling better.  Her  review of systems is negative.   PHYSICAL EXAM:  Blood pressure is 125/76.  Pulse rate is 90.  The patient is oriented to person, time and place.  Affect is normal.  HEENT:  Reveals no xanthelasma.  She has normal extraocular motion.  There are no carotid bruits.  There is no jugular venous distention.  Cath site is nicely healed.  Lungs are clear.  Respiratory effort is not labored.  Cardiac exam reveals S1-S2.  There no clicks or significant murmurs.  The abdomen is soft.  She has normal bowel sounds.  There is no peripheral edema.   No labs were done today.   PROBLEMS:  1. Coronary disease post CABG in 2003.  2. Recent cath showing great patent grafts.  3. History of good LV function.  4. Chronic renal insufficiency on dialysis.  5. Dyslipidemia.  6. History of aspirin allergy, but she tolerates low-dose aspirin.  7. Hypertension controlled.  8. History of hyperparathyroidism followed by  Dr. Everardo All.  9. Diabetes.   Her cardiac status is stable.  I can see her back in 1 year for  cardiology follow-up.     Luis Abed, MD, [facility]  Electronically Signed    JDK/MedQ  DD: 12/20/2007  DT: 12/20/2007  Job #: [id number]

## 2011-04-04 NOTE — Op Note (Signed)
[name], [name]              ACCOUNT NO.:  [id number]   MEDICAL RECORD NO.:  [id number]          PATIENT TYPE:  AMB   LOCATION:  SDS                          FACILITY:  [facility]   PHYSICIAN:  Charles E. Fields, MD  DATE OF BIRTH:  19-May-1941   DATE OF PROCEDURE:  05/08/2007  DATE OF DISCHARGE:                               OPERATIVE REPORT   PROCEDURE:  Thrombectomy and revision left forearm arteriovenous graft.   PREOPERATIVE DIAGNOSIS:  Thrombosed arteriovenous graft.   POSTOPERATIVE DIAGNOSIS:  Thrombosed arteriovenous graft.   ANESTHESIA:  Local with IV sedation.   OPERATIVE FINDINGS:  1. Venous anastomotic narrowing.  2. Kink of arterial limb.   OPERATIVE DETAIL:  After obtaining informed consent, the patient was  taken to the operating.  The patient placed in the supine position on  the operating table.  After adequate sedation, the patient's entire left  upper extremity was prepped and draped in the usual sterile fashion.  Local anesthesia was infiltrated at a preexisting longitudinal scar near  the antecubital crease.  Incision was made through this longitudinal  scar, carried down through subcutaneous tissues down to the level of the  graft.  The arterial limb of the graft was on the medial aspect of the  arm.  This was dissected free circumferentially.  The venous limb of the  graft was dissected free circumferentially on the ulnar side of the arm.  Dissection was carried up to the level of the venous anastomosis.  Vein  was dissected free circumferentially.  The distal portion of vein was  ligated and divided between 3-0 silk ties.  Next, the patient was given  5000 units of intravenous heparin.  Transverse graftotomy was made just  above the level of venous anastomosis.  #4 Fogarty catheter was used to  thrombectomized the venous limb of the graft.  There was some venous  backbleeding at this point.  The anastomosis was probed and found to  easily accept a three  and four coronary dilator but a five dilator was  snug.  Therefore the venous anastomosis was taken down.  There was some  neointimal hyperplasia at the anastomotic site.  This was debrided away.  The vein was dissected free circumferentially slightly higher up and  spatulated.  Next attention was turned the arterial limb of graft.  A #3 and #4 Fogarty catheters were passed up towards the arterial limb  of the graft multiple times.  However, the catheter would not advance  completely through the arterial anastomosis.  Therefore the arterial  limb of graft was dissected free circumferentially.  It was noted at  this point that there was a kink in the graft where the graft had  tunneled underneath a small slip of muscle.  Therefore the graft was  transected at this level and the graft elevated above this area of  muscle so that there was no further kinking.  The main body of the graft  was then easily thrombectomized and thoroughly flushed with heparinized  saline.  Under direct vision the arterial anastomosis was  thrombectomized and the arterial plug was retrieved.  This was then  clamped proximally with a fistula clamp.  The arterial graft to graft  anastomosis was then performed using a running 6-0 Prolene suture.  Just  prior to completion anastomosis this was forward and back bled and  thoroughly flushed.  Anastomosis was secured, clamps released.  There  was good pulsatile flow into the graft up to the level of vein.  A new 6  mm interposition graft was brought up into the operative field.  This  beveled and the vein was spatulated.  This was sewn end-to-end to the  vein using a running 6-0 Prolene suture.  At completion anastomosis the  clamp was moved down to the level of the graft.  The graft was cut to  length and sewn end-to-end to the old graft using a running 6-0 Prolene  suture.  Just prior to completion anastomosis this was forward and  backward bled and thoroughly flushed.   Anastomosis was secured, clamps  released.  There was good palpable thrill in the graft immediately.  Doppler was used to inspect the radial artery.  There was approximately  augment of flow approximately 80% with clamping of the graft.  Next  hemostasis was obtained.  Subcutaneous tissues were reapproximated using  running 3-0 Vicryl suture.  Skin was closed with 4-0 Vicryl subcuticular  stitch.  The patient tolerated the procedure well and there were no  complications.  Instruments, sponge and needle counts correct at the end  of the case.  The patient taken to recovery room in stable condition.      Janetta Hora. Fields, MD  Electronically Signed     CEF/MEDQ  D:  05/08/2007  T:  05/08/2007  Job:  [id number]

## 2011-04-07 NOTE — Discharge Summary (Signed)
NAME:  [name], [name]                        ACCOUNT NO.:  [id number]   MEDICAL RECORD NO.:  [id number]                   PATIENT TYPE:  INP   LOCATION:  2039                                 FACILITY:  [facility]   PHYSICIAN:  Jody P. Melvyn Neth, P.A.                 DATE OF BIRTH:  15-Jan-1941   DATE OF ADMISSION:  06/02/2002  DATE OF DISCHARGE:  06/11/2002                                 DISCHARGE SUMMARY   ADMISSION DIAGNOSES:  1. Chest pain.  2. Abnormal Cardiolite study.   FINAL DIAGNOSES:  1. Severe acute coronary artery disease.  2. Non-Q-wave myocardial infarction.  3. Acute and chronic renal insufficiency.  4. Ejection fracture 40% to 50%.  5. Hypertension.  6. Asthma.  7. Gastroesophageal reflux disease.  8. Gout.  9. Iron deficiency anemia.  10.      Secondary hyperparathyroidism.  11.      Thrombocytopenia.  12.      History of smoking.  13.      Hypercholesterolemia.   PROCEDURE:  1. A 2-D echocardiogram on 06-03-02, 40% to 50%.  2. Coronary catheterization on 06-03-02.  3. Coronary artery bypass graft times four on June 06, 2002 with the     following grafts. Left internal mammary artery to the left anterior     descending artery, saphenous vein graft to posterior descending artery,     saphenous vein graft to circumflex, saphenous vein graft to diagonal.   BRIEF HISTORY:  The patient  is a 70 year old  female with a history of  chronic renal insufficient with a creatinine of 1.6 back in 1987.  She was  also a smoker and had high cholesterol and hypertension. She initially  presented with unstable anginal symptoms and an abnormal Cardiolite study a  few days before. Her recent baseline creatinine has been approximately 2.5.  She was admitted and stabilized medically and underwent cardiac  catheterization. This showed significant three-vessel coronary artery  disease with more than 80% stenosis of the left anterior descending artery.  Dr. Tyrone Sage was consulted  after reviewing the data he recommended coronary  artery bypass graft as the best treatment. Risks, benefits, details and  alternatives were discussed and it was agreed to proceed. She underwent the  procedure on 06-06-02. There were no complications. Of note, she had  endoscopic vein harvest from her right thigh.   Postoperatively she did well. Nephrology service was also following Ms.  Ward.  She had good urinary output. Her anemia remained stable. She was  put on Ecotrin and NSAID. She was transferred to Unit 2000 Postop Day #3.  There she continued to progress working with cardiac rehab.  She did have  thrombocytopenia. Heparin-induced thrombocytopenia study was negative.  Her  platelets improved to 153. Her creatinine was stable at 2.3.  Her urinary  output was good.   By June 11, 2002, Postop Day #9 she was doing  very well. She was in sinus  rhythm at 82 beats per minute. Blood pressure was 126/72. She was afebrile.  BUN was 39. Creatinine 2.3. Hemoglobin 9.0. Hematocrit 26.9. She was  diuresing well. She was ambulating well.  Wounds are healing well. She was  discharged home in satisfactory condition.   MEDICATIONS:  1. Enteric coated aspirin 325 mg one daily.  2. Lopressor 50 mg tablet 1/2 tablet every 12 hours.  3. Gemfibrozil 300 mg tablet one twice a day.  4. She was told to resume her Pulmicort.  5. She was told to resume her Restoril.  6. Darvocet one to two every four to six hours p.r.n. for pain.   CONDITION ON DISCHARGE:  Stable.   DISPOSITION:  Home.   SPECIAL INSTRUCTIONS:  She was told to avoid driving, heavy lifting,  strenuous activity, working. She was told that she could shower and to clear  her wounds gently daily with soap and water and to call the office if she  had any problems with her wounds. She was told to maintain a renal diet. She  told to get a chest x-ray when she saw her cardiologist and to bring it with  her to see Dr. Tyrone Sage.   FOLLOW  UP:  1. Dr. Myrtis Ser two weeks after discharge.  2. Dr. Tyrone Sage, Thursday, July 03, 2002 at 10:40 a.m.  3. She was told to follow up with her kidney doctor as directed.                                               Jody P. Diamond Nickel.    JPL/MEDQ  D:  06/25/2002  T:  06/30/2002  Job:  [id number]   cc:   Luis Abed, M.D. [facility]

## 2011-04-07 NOTE — Cardiovascular Report (Signed)
[place]  Patient:    [name], [name] Visit Number: [id number] MRN: [id number]          Service Type: MED Location: 2300 2306 01 Attending Physician:  Rollene Rotunda Dictated by:   Salvadore Farber, M.D., [facility] Proc. Date: 06/03/02 Admit Date:  06/02/2002   CC:         Justine Null, M.D. [facility]  Luis Abed, M.D. [facility] C. Eden Emms, M.D. [facility]   Cardiac Catheterization  PROCEDURES: Coronary angiography, left heart catheterization via right femoral artery.  INDICATIONS: This is a 70 year old lady who presents with unstable angina with modest troponin elevation. Cardiolite imaging demonstrated apical defect at stress.  DIAGNOSTIC TECHNIQUE: After the administration of lidocaine, a 6 French sheath was placed in the right femoral artery via the modified Seldinger technique. A 6 French Judkins left catheter was engaged to the left coronary artery and a 6 French Judkins right catheter was used to engage the right coronary artery. Coronary angiography was performed by hand injection with contrast. After angiography, the Judkins right catheter was advanced across the aortic valve and used to measure left ventricular pressures. Sheaths to be removed in the holding room.  FINDINGS: 1. Right coronary artery: There is a 95% stenosis of the mid right coronary    artery after the first acute marginal branch. There is associated thrombus.    After a second acute marginal branch the mid right is then totally    occluded. The distal RCA fills via collaterals from the circumflex    territory. 2. Left main: Normal. 3. LAD: There is a 90% stenosis after the first diagonal branch and an    80% stenosis of the apical vessel. The very large first diagonal branch    has an 80% stenosis in its proximal portion with minimal disease distal    to the stenosis. 4. Circumflex: There is then an 80% ostial stenosis. There is a single large    obtuse  marginal branch.  IMPRESSION: Severe three-vessel coronary artery disease in a woman with unstable coronary syndrome.  PLAN: Plan is for coronary artery bypass grafting. Will resume heparin four hours after sheath removal to be continued while she awaits surgical intervention. Dictated by:   Salvadore Farber, M.D., [facility] Attending Physician:  Rollene Rotunda DD:  06/03/02 TD:  06/05/02 Job: [id number]

## 2011-04-07 NOTE — H&P (Signed)
NAME:  [name], DETLEFSEN                        ACCOUNT NO.:  [id number]   MEDICAL RECORD NO.:  [id number]                   PATIENT TYPE:  INP   LOCATION:  2001                                 FACILITY:  [facility]   PHYSICIAN:  Charlies Constable, M.D. [facility]              DATE OF BIRTH:  1941/09/13   DATE OF ADMISSION:  01/22/2003  DATE OF DISCHARGE:                                HISTORY & PHYSICAL   PRIMARY CARE PHYSICIAN:  Sean A. Everardo All, M.D. [facility]   CARDIOLOGIST:  Willa Rough, M.D. [facility]   CHIEF COMPLAINT:  Right sided chest pain.   HISTORY OF PRESENT ILLNESS:  Clinically, the patient is a 70 year old female  who had bypass surgery in July of 2003.  At about 1 AM she awoke with right  sided chest pain which she described as a sharp pain.  This pain was worse  when she would lay down and better when she sat up and was worse with deep  inspiration.  She did get some nausea and diaphoresis  with this and some  shortness of breath.  She has had no exertion-related chest pain.  She has  had no fever or cough.  Because of her persistence of symptoms she came to  the emergency room.  She was concerned this might be from her heart since it  was similar to the pain prior to her surgery although in a different  location.   PAST MEDICAL HISTORY:  Her past medical history is significant for type 2  diabetes, hyperlipidemia, hypertension and chronic renal insufficiency with  creatinine of 2.3 in the past.  She also has a history of asthma, gout and  some left ventricular dysfunction with ejection fraction of 40 to 50%.   ALLERGIES:  Patient is reported to be allergic to ASPIRIN, which causes her  gastrointestinal upset.   MEDICATIONS:  Her other medications include albuterol, Serevent, Flovent,  Actos, aspirin, Lopressor and gemfibrozil.   SOCIAL HISTORY:  The patient lives in [place].  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  For details  of  social history, family history and review of systems, please see complete  note.   PHYSICAL EXAMINATION:  VITAL SIGNS:  Blood pressure was 146/77, pulse 72 and  regular.  Temperature is 99.1.  NECK:  There is no venous distention.  Carotids are full without bruits.  CHEST:  The chest showed equal breath sounds.  There was some mild  respiratory wheezes.  CARDIAC:  Examination showed a regular heart rhythm.  The first and second  heart sounds were normal.  There were no murmurs or gallops.  ABDOMEN:  Was soft without organomegaly or pulsatile masses.  There was no  tenderness and the bowel sounds were normal.  EXTREMITIES:  Show good pulses with no peripheral edema.  MUSCULOSKELETAL:  System showed no deformities.  NEUROLOGICAL:  Examination showed no focal neurological signs.  SKIN:  The skin was warm and dry.   LABORATORY DATA:  Her electrocardiogram  was normal.  Her initial enzymes  are negative.   IMPRESSION:  1. Pleuritic chest pain suggestive of pericarditis or pleurisy.  2. Coronary artery disease status post coronary artery bypass grafting     surgery July 2003.  3. Previous ejection fraction of 40 to 45%.  4. Type 2 diabetes.  5. Chronic renal insufficiency with creatinine in the 2.3 range.  6. Hypertension.  7. Asthma.  8. Hyperlipidemia.   RECOMMENDATIONS:  The patient's symptoms sound most suggestive of either  pericarditis or pleurisy.  We will plan to obtain routine laboratory studies  and admit the patient for observation.  Will plan an echocardiogram to  evaluate for possible pericarditis and a VQ scan to evaluate for possible  pulmonary embolism.  We will do a spiral CT scan in view of her renal  insufficiency.  I will decide, once we have the results of these initial  studies, how to proceed with further evaluation.                                               Charlies Constable, M.D. [facility]    BB/MEDQ  D:  01/22/2003  T:  01/22/2003  Job:  [id number]   cc:   Gregary Signs  A. Everardo All, M.D. [facility]   Willa Rough, M.D. [facility]

## 2011-04-07 NOTE — Op Note (Signed)
[place]. [facility]  Patient:    [name], [name] Visit Number: [id number] MRN: [id number]          Service Type: MED Location: 2000 2039 01 Attending Physician:  Waldo Laine Dictated by:   Gwenith Daily Tyrone Sage, M.D. Proc. Date: 06/06/02 Admit Date:  06/02/2002 Discharge Date: 06/11/2002                             Operative Report  PREOPERATIVE DIAGNOSES: 1. Coronary occlusive disease. 2. Renal insufficiency.  POSTOPERATIVE DIAGNOSES: 1. Coronary occlusive disease. 2. Renal insufficiency.  SURGICAL PROCEDURE: 1. Coronary artery bypass graft x 4 with left internal mammary to the left    anterior descending coronary artery. 2. Reverse saphenous vein graft to the diagonal coronary artery. 3. Reverse saphenous vein graft to the circumflex coronary artery. 4. Reverse saphenous vein graft to the posterior descending coronary    artery. 5. Right Endo-Vein harvesting.  SURGEON:  Gwenith Daily. Tyrone Sage, M.D.  FIRST ASSISTANT:  Salome Arnt, P.A.  BRIEF HISTORY:  The patient is a 70 year old female who presented with unstable anginal symptoms.  She has been followed for several years with renal insufficiency with baseline creatinine of approximately 2.5.  Because of her unstable anginal symptoms, she was admitted and stabilized medically and underwent cardiac catheterization which demonstrated significant three vessel disease with greater than 80% stenosis of the LAD, stenosis of the diagonal 70-80%, 80% stenosis of the circumflex, total occlusion of the right coronary artery.  Because of the patients significant three vessel coronary artery disease coronary artery bypass grafting was recommended to the patient who agreed and signed informed consent.  DESCRIPTION OF PROCEDURE:  With Theone Murdoch and arterial line monitors placed, the patient underwent general endotracheal anesthesia without incident.  The skin and chest were prepped with Betadine  and draped in the usual sterile fashion.  Vein was harvested with the Endo-Vein harvesting system from the right thigh and was of good quantity and caliber.  The median sternotomy was formed.  The left internal mammary artery dissected down the pedicle graft. The distal artery was divided and had good free flow.  The pericardium was opened.  Overall ventricular function was observed.  The patient was systemically heparinized.  Ascending aorta and the right atrium were cannulated and the aortic root.  Cardioplegia solution was introduced into the aorta.  The patient was placed on cardiopulmonary bypass 2.4 liters per minute per metered square.  Sites of anastomosis were selected and dissected out the epicardium.  The patients body temperature was cooled to 30 degrees.  The aorta cross clamped followed by 500 cc of cold potassium cardioplegia was administered with rapid diastolic arrest of the heart.  The myocardial temperature was monitored throughout the cross clamp.  Attention was turned first to the circumflex coronary artery which was open and was a good sized vessel admitting 1.5 mm probe using running 7-0 Prolene distal anastomosis was performed.  Attention was then turned to the posterior descending coronary artery which was smaller vessel but did admit a 1.5 mm probe using running 7-0 Prolene distal anastomosis was performed.  Attention was then turned to the diagonal coronary artery which was opened and admitted a 1 mm probe using a running 7-0 Prolene distal anastomosis performed with reverse saphenous vein graft.  Attention was then turned to the left anterior descending coronary artery which was opened in the mid portion using running 8-0 Prolene, left internal  mammary artery anastomosis, left anterior descending coronary artery.  With release of flow on the mammary artery there was appropriate rise in myocardial temperature.  The aortic cross clamp was removed.  Total cross  clamp time was 57 minutes.  The patient required electrodefibrillation turning to sinus rhythm.  A partial occlusion clamp was placed on the aorta.  Three punch aortotomies were performed.  Three vein grafts anastomosed to the ascending aorta.  Air was evacuated from the grafts and partial occlusion clamp and sites of the anastomosis were inspected free of bleeding.  The patient was then ventilated and removed from cardiopulmonary bypass without difficulty.  She remained hemodynamically stable.  She was decannulated in the usual fashion.  Protamine sulfate was administered.  With the operative field hemostatic, two atrial and two ventricular pacing wires were applied.  Left portal tube, two mediastinal tubes were left in place. The sternum was closed with #6 stainless steel wire.  The fascia was closed with interrupted 0 Vicryl, running 3-0 Vicryl in the subcutaneous tissues, 4-0 subcuticular stitches in the skin edges.  Dry dressings were applied.  Sponge and needle counts were reported as correct at the completion of the procedure.  The patient tolerated the procedure without obvious complication and was transferred to the surgical intensive care unit for further postoperative care. Dictated by:   Gwenith Daily Tyrone Sage, M.D. Attending Physician:  Waldo Laine DD:  06/08/02 TD:  06/11/02 Job: [id number]

## 2011-04-07 NOTE — Consult Note (Signed)
NAME:  [name], [name] NO.:  [id number]   MEDICAL RECORD NO.:  [id number]                   PATIENT TYPE:  INP   LOCATION:  2001                                 FACILITY:  [facility]   PHYSICIAN:  Maree Krabbe, M.D.             DATE OF BIRTH:  [birth year]   DATE OF CONSULTATION:  DATE OF DISCHARGE:                                   CONSULTATION   REASON FOR CONSULTATION:  Elevated creatinine and anemia.   HISTORY:  The patient is a 70 year old white female with known chronic renal  failure followed by [name], M.D. at [facility]  since 1999.  She has slowly progressive renal insufficiency since the late  80's with heavy proteinuria but was never biopsied.  She has chronic anemia  and being treated in the EPO clinic with __________ injections and  intermittent intravenous iron as needed.  CABG in July 2003 and now presents  with right-sided pleuritic chest pain of 48 hours duration.  She was treated  initially with nitroglycerin yesterday, and she is having a VQ scan today  and as well as echocardiogram.  Cardiac enzymes have been negative.   Of note, hemoglobin was 11.1 yesterday but down to 9.1 today.  Platelets  112,000.  The patient has been seen regularly in the EPO clinic.  She was on  10,000 units in August of last year, dropped to 6000 units per week in  October of last year and in January of this year, dropped to 6000 units  every other week.  The patient's baseline creatinine was 2.5-2.8.   PAST MEDICAL HISTORY:  1. 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  2. Diabetes mellitus, type 2.  3. Hyperlipidemia.  4. Hypertension.  5. Chronic renal failure.  6. Asthma.  7. Gout.  8. Ejection fraction 45%.   SOCIAL HISTORY:  Lives in [place], works at the [employer].  She is a  prior smoker.  No alcohol or illicit drug use.   FAMILY HISTORY:  Noncontributory.   REVIEW OF SYMPTOMS:  GENERAL:   Denies fever, chills, sweats, weight loss.  ENT:  Denies hearing loss, headache, sore throat, visual change.  SKIN:  Denies rash.  CARDIOPULMONARY:  As above.  She does have shortness of  breath, orthopnea and no hemoptysis or purulent sputum production.  GI:  She  had one episode of emesis yesterday.  No melena, hematemesis, odynophagia,  or change in bowel habits.  GU:  No change, no frequency or urgency or  dysuria or hematuria.  NEUROLOGIC/PSYCHIATRIC:  Denies symptoms of  depression, anxiety.  MUSCULOSKELETAL:  Denies myalgias, arthralgias, joint  swelling, or pain.  ENDOCRINE:  Denies heat or cold intolerance, polyuria,  polydipsia.   PHYSICAL EXAMINATION:  VITAL SIGNS:  Temperature 99.7, respirations 20,  blood pressure 130/62, 96% saturated on room air.  GENERAL:  Well-developed black female in  no acute distress.  HEENT:  PERRLA, EOMI.  Throat is clear.  NECK:  Supple without JVD.  JVD is 8 cm.  CHEST:  Clear throughout.  CARDIAC:  Regular rate and rhythm without murmur, rub, or gallop.  ABDOMEN:  Soft, nontender, active bowel sounds, no rebound or guarding.  No  organomegaly.  GU:  Deferred.  RECTAL:  Deferred.  EXTREMITIES:  No cyanosis or clubbing or rash.  She has trace to 1+ ankle  edema bilaterally.  NEUROLOGIC:  Grossly nonfocal motor exam.  SKIN:  No rash.   LABORATORY DATA FROM TODAY:  Hemoglobin 9.1, hematocrit 26%, platelets 112  up from 9.8.  Amylase and lipase normal.  Cardiac enzymes unremarkable.  Urinalysis with small leukocytes, 20-50 white blood cells, and Trichomonas.  Electrolytes yesterday:  Sodium 139, potassium 2.8, CO2 25, BUN 51,  creatinine 2.6.  Liver enzymes within normal limits.  Calcium 10.1, albumin  4.1.  Chest x-ray:  No acute findings.   IMPRESSION:  1. 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  She has stable renal function     with estimated clearance of 25 mL per  minute.  2. Anemia.  Hemoglobin was 11.1 yesterday and down to 9.1 today.  Uncertain     accuracy.  Need to evaluate for gastrointestinal bleed, but there is no     gross evidence of this.  There is no need for a bone marrow exam.  If her     hemoglobin is truly down now, the recent reduction in her erythropoietin     dose is the most likely cause, and it will likely need to be pushed back     up.  3. Hypertension which is controlled on beta blocker.  4. Pleuritic chest pain.  Work-up underway.  Would limit NSAID use to 48-72     hours due to chronic renal failure.  5. History of CABG 2003.  Will follow with you.                                               Maree Krabbe, M.D.    RDS/MEDQ  D:  01/23/2003  T:  01/24/2003  Job:  [id number]   cc:   [name], M.D. [facility]

## 2011-04-07 NOTE — Discharge Summary (Signed)
NAME:  [name], [name] NO.:  [id number]   MEDICAL RECORD NO.:  [id number]                   PATIENT TYPE:  INP   LOCATION:  5511                                 FACILITY:  [facility]   PHYSICIAN:  Rene Paci, M.D. [facility]          DATE OF BIRTH:  1941-06-04   DATE OF ADMISSION:  06/05/2004  DATE OF DISCHARGE:  06/15/2004                                 DISCHARGE SUMMARY   DISCHARGE DIAGNOSES:  1. Fever or febrile illness.  2. Leukocytosis.  3. Right upper lobe pneumonia.  4. Respiratory insufficiency.  5. Acute on-chronic renal insufficiency.  6. Thrombocytopenia.  7. Anemia of chronic disease.   BRIEF ADMISSION HISTORY:  [name] is a 70 year old African-American  female who presents to the emergency department with complaints of pain all  over. She described a four-day history of nausea and vomiting and diarrhea,  associated with chest pain and diffuse myalgias.   PAST MEDICAL HISTORY:  1. Adult-onset diabetes mellitus.  2. Chronic renal insufficiency with baseline creatinine in the 2 range.  3. Hypertension.  4. Hypercholesterolemia.  5. COPD.  6. Coronary artery disease status post CABG in July 2003.   HOSPITAL COURSE:  Problem 1. Infectious disease.  The patient presented with  fever and leukocytosis. Chest x-ray was consistent with right upper lobe  pneumonia. She was empirically started on Avelox. Initially, the patient  defervesced and her white count improved. However, she had a secondary  rebound of her white count. This prompted an antibiotic change to Zosyn.  Repeat chest x-ray was obtained and although it showed a worsening right  upper lobe infiltrate, this was felt to be secondary to hydration and not  because of worsening symptoms. The patient's white count has again begun to  normalize and symptomatically she is improved. She is afebrile. She is  maintaining saturations of 88-95% on room air. The patient has completed 10  days of antibiotics at this time.   Problem 2. Anemia.  The patient has an anemia of chronic disease secondary  to her chronic renal insufficiency, but also had evidence of iron  deficiency. The patient did require transfusion of two units of packed red  blood cells on June 13, 2004. She has remained hemodynamically stable since  that time.   Problem 3. Acute on-chronic renal insufficiency.  The patient's creatinine  on admission was 5.9. This was felt to be secondary to acute GI loss  associated with her nausea and vomiting and diarrhea, as well as her febrile  illness. The patient was aggressively rehydrated with a correction of her  creatinine to 2.3.   Problem 4. Hypotension.  Again, this was felt to be secondary to her febrile  illness as well as dehydration. Then again, she was aggressively rehydrated  with normalization of her blood pressures.   Problem 5. Hyperkalemia.  The patient had a transient episode of  hyperkalemia. This was felt to be secondary to  her acute on-chronic renal  insufficiency. She was not on any exogenous potassium. We did give her a  dose of Kayexalate and her potassium corrected.   Problem 6. Thrombocytopenia.  This was transient and it did correct.   Problem 7. Adult-onset diabetes mellitus.  This remained stable throughout  her entire hospitalization.   Problem 8.  Labs at discharge.  BUN 14, creatinine 2.3, potassium 3.8. White  count 12.9, hemoglobin 10.4, platelet count 264,000.   MEDICATIONS AT DISCHARGE:  She is to resume her medications as at home which  include:  1. Allopurinol 100 mg daily.  2. Calcitrol 0.25 mg daily.  3. Aspirin 81 mg daily.  4. Actos 45 mg daily.  5. Toprol XL 200 mg daily.  6. Advair/Albuterol at home.  7. Prilosec 20 mg daily.  8. Vytorin daily.  9. Zetia 10 mg daily.  10.      Maxzide was listed as a medication at admission; however, she     states she does not take this medication.  11.      Additionally, we  have added iron sulfate 325 mg b.i.d.   FOLLOW-UP:  The patient should follow up with Drs. Deterding and Everardo All as  needed.      Cornell Barman, P.A. [facility]                  Rene Paci, M.D. [facility]    LC/MEDQ  D:  06/15/2004  T:  06/15/2004  Job:  [id number]   cc:   Gregary Signs A. Everardo All, M.D. [facility]   James L. Deterding, M.D.  [address]  [place]  [place] [address]  Fax: [phone]

## 2011-04-07 NOTE — H&P (Signed)
[place]  Patient:    [name], [name] Visit Number: [id number] MRN: [id number]          Service Type: MED Location: [id number] Attending Physician:  Rollene Rotunda Dictated by:   Dian Queen, P.A.C. [facility] Admit Date:  06/02/2002                           History and Physical  DATE OF BIRTH:  [birth year]  CHIEF COMPLAINT:  Chest pain.  HISTORY OF PRESENT ILLNESS:  [name] is a 70 year old, African-American female, with hyperlipidemia, hypertension, family history of coronary artery disease, remote history of cigarette use, who is admitted now with chest discomfort.  She saw Dr. Everardo All on May 20, 2002, for chest pain and was set up for a rest stress Cardiolite that was done on May 30, 2002.  This cardiolite was read by Dr. Jens Som, and reveals extensive defect in the apical wall.  Rest images were not obtained, and therefore ischemia versus infarct could not be differentiated; however, the patients ejection fraction was 62% with apical akinesis.  She has been having ongoing pain now for several days and is seen here today for the first time.  Her pain is described as a pressure and seems to be intermittent but is also atypical in that her chest is very sore to the touch, and it hurts for her to turn and twist.  She has had no arm pain but has had some mild shortness of breath.  FAMILY HISTORY:  Father died in an accident and mother had CABG at age 74. She has five siblings who are alive and well.  REVIEW OF SYSTEMS:  No change in bowel habits, melena, hematochezia.  No GU symptoms.  Weight has been stable.  She is occasionally dizzy.  PAST MEDICAL HISTORY: 1. Gout. 2. Hyperlipidemia. 3. Hypertension. 4. Intermittent bronchospasm.  PRESENT MEDICATIONS:  1. Zocor 80 mg q.d.  2. K-Dur 20 mEq b.i.d.  3. HCTZ 12.5 mg q.d.  4. Gemfibrozil 300 mg b.i.d.  5. Hectorol 25 mg q.d.  6. Meclizine 12.5 mg b.i.d.  7. Lasix 80 mg  q.d.  8. Verapamil SA 240 q.d.  9. Vanceril and Pulmicort b.i.d. 10. Allopurinol 100 mg q.d. 11. Epogen 5000 units q Wednesday.  PHYSICAL EXAMINATION:  GENERAL:  She is a pleasant female.  VITAL SIGNS:  Blood pressure 156/80 in sinus rhythm, 97 per minute.  HEENT:  Extraocular muscles intact, sclerae nonicteric, conjunctivae injected bilaterally. Face symmetrical.  NECK:  Supple without thyromegaly.  No bruits.  No JVD.  LUNGS:  Clear to P&A bilaterally.  HEART:  In sinus rhythm, S1, S2 normal.  S4 is present.  There is no rub, murmur, or gallop heard.  ABDOMEN:  Soft, no masses or hepatosplenomegaly.  EXTREMITIES:  Dorsalis pedal pulses bilaterally.  IMPRESSION: 1. Chest pain with abnormal Cardiolite, suggesting apical scar.  She has a    normal EKG.  Further evaluation is indicated. 2. Hyperlipidemia - on Zocor. 3. Family history of coronary artery disease - mother had CABG x 7 at age 58. 4. Controlled hypertension. 5. Remote cigarette use.  DISPOSITION:  We will bring the patient into the ambulance today from the office and will go ahead and start her on some oxygen, IV nitroglycerin, and heparin.  Plan to cath her tomorrow or sooner if necessary. Dictated by:   Dian Queen, P.A.C. [facility] Attending Physician:  Rollene Rotunda DD:  06/02/02 TD:  06/02/02 Job: [id number]

## 2011-04-07 NOTE — Discharge Summary (Signed)
   NAME:  [name], [name]                        ACCOUNT NO.:  [id number]   MEDICAL RECORD NO.:  [id number]                   PATIENT TYPE:  INP   LOCATION:  2001                                 FACILITY:  [facility]   PHYSICIAN:  Charlies Constable, M.D. [facility]              DATE OF BIRTH:  1941-04-18   DATE OF ADMISSION:  01/22/2003  DATE OF DISCHARGE:  01/25/2003                           DISCHARGE SUMMARY - REFERRING   HISTORY OF PRESENT ILLNESS:  The patient is a 70 year old African American  female with prior CABG in July 2003 at which time she received an LIMA to  the LAD, SVG to the PDA, SVG to the circumflex and SVG to the diagonal.  She  has type 2 diabetes, hyperlipidemia, hypertension, and chronic renal  insufficiency with creatinine of in the low 2s.  She has had intermittent  bronchospasm and a history of gout.  Her ejection fraction is in the 42%  range.  She is admitted with some chest pain.   LABORATORY DATA:  Electrolytes and renal function were abnormal in that her  creatinine was in the 2.3 to 2.6 range.  Cardiac enzymes were normal.  Electrolytes were normal.  She had a ventilation perfusion lung scan which  was low probability.  She had a 2-D echocardiogram which revealed an  ejection fraction of 55-65% with no regional wall motion abnormality.  She  had mild calcification of the aortic valve.   HOSPITAL COURSE:  Infarct was ruled out.  We felt that her pain was perhaps  musculoskeletal.  The ventilation perfusion lung scan was normal.  She was  followed by the renal service as well.  No medication changes were made.  We  plan to get an outpatient Cardiolite but at this point we feel that her pain  is not cardiac.   IMPRESSION:  1. Coronary artery disease with prior coronary artery bypass graft.     Admitted on this occasional with atypical chest pain and infarct ruled     out.  Ventilation perfusion lung scan low probability.  2. Preserved left ventricular function.  3.  Hyperlipidemia.  4. Controlled hypertension.  5. Diabetes.  6. Chronic renal insufficiency.    DISPOSITION:  1. Continue current medications.  2. We will do an Adenosine Cardiolite as an outpatient.     Dian Queen, P.A. [facility]                     Charlies Constable, M.D. [facility]    BY/MEDQ  D:  01/25/2003  T:  01/26/2003  Job:  [id number]   cc:   Fayrene Fearing L. Deterding, M.D.  [address]  [place]  [place] [address]  Fax: [phone]   Gregary Signs A. Everardo All, M.D. [facility]

## 2011-04-07 NOTE — Op Note (Signed)
[name], [name]              ACCOUNT NO.:  [id number]   MEDICAL RECORD NO.:  [id number]          PATIENT TYPE:  AMB   LOCATION:  SDS                          FACILITY:  [facility]   PHYSICIAN:  Charles E. Fields, MD  DATE OF BIRTH:  [birth year]   DATE OF PROCEDURE:  03/04/2007  DATE OF DISCHARGE:                               OPERATIVE REPORT   PROCEDURE:  Left forearm AV graft.   PREOPERATIVE DIAGNOSIS:  End-stage renal disease.   POSTOPERATIVE DIAGNOSIS:  End-stage renal disease.   ANESTHESIA:  Local with IV sedation.   ASSISTANT:  Delight Hoh, R.N.   OPERATIVE FINDINGS:  1. A 3 mm deep brachial vein.  2. A 6 mm PTFE.   OPERATIVE DETAILS:  After obtaining informed consent, the patient was  taken to the operating.  The patient was placed in the supine position  on the operating table.  After adequate sedation, the patient's entire  left upper extremity was prepped and draped usual sterile fashion.  Local anesthesia was infiltrated near the antecubital crease.  Longitudinal incision was made in this location and carried down through  subcutaneous tissues down to the level of brachial artery and adjacent  deep brachial vein.  These were both approximately 3 mm in diameter.  The artery was dissected free circumferentially and Vesseloops placed  proximal and distal to planned site of arteriotomy.  Next, the vein was  dissected free circumferentially and small side branches ligated and  divided between silk ties.  Next, a 6 mm PTFE graft was then brought in  the operative field and this was tunneled subcutaneously in a loop  configuration down the forearm with a transverse incision of the distal  forearm for assistance in tunneling.  The patient was then given 5000  units of intravenous heparin.  Vesseloops were pulled taut on the  artery.  Longitudinal arteriotomy was made.  The graft was slightly  beveled, sewn end of graft to side of artery using a running 6-0 Prolene  suture.  Just prior completion anastomosis, this was forebled, backbled  and thoroughly flushed.  Anastomosis was secured, graft was then clamped  at its apex.  Vesseloops were released and there was good pulsatile flow  to the apex of the graft.  The graft was then pulled taut to length.  The vein was controlled proximally and distally with fine bulldog  clamps.  Longitudinal venotomy was made.  The graft was then beveled and  sewn end graft to side of vein using a running 6-0 Prolene suture.  Just  prior to completion anastomosis, this was forebled, backbled and  thoroughly flushed.  Anastomosis was secured, clamps released, there was  palpable thrill above the graft immediately.  Hemostasis was obtained  with thrombin and Gelfoam.  Next, subcutaneous tissues of both incisions  were reapproximated using running 3-0 Vicryl suture.  Skin of both  incisions was closed with a 4-0 Vicryl subcuticular stitch.  The patient  tolerated the procedure well and there were no complications.  Instrument, sponge and needle count was correct at the end of the case.  The patient had  palpable radial pulse at the end of the case.  The  patient was taken to the recovery room in stable condition.      Janetta Hora. Fields, MD  Electronically Signed     CEF/MEDQ  D:  03/04/2007  T:  03/04/2007  Job:  [id number]

## 2011-05-22 ENCOUNTER — Encounter: Payer: Self-pay | Admitting: Cardiology

## 2011-05-26 ENCOUNTER — Other Ambulatory Visit (HOSPITAL_COMMUNITY): Payer: Self-pay | Admitting: Nephrology

## 2011-05-26 DIAGNOSIS — N186 End stage renal disease: Secondary | ICD-10-CM

## 2011-06-06 ENCOUNTER — Ambulatory Visit (HOSPITAL_COMMUNITY)
Admission: RE | Admit: 2011-06-06 | Discharge: 2011-06-06 | Disposition: A | Discharge status: Home / Self Care | Payer: Medicare Other | Source: Ambulatory Visit | Attending: Nephrology | Admitting: Nephrology

## 2011-06-06 ENCOUNTER — Other Ambulatory Visit (HOSPITAL_COMMUNITY): Payer: Self-pay | Admitting: Nephrology

## 2011-06-06 DIAGNOSIS — N186 End stage renal disease: Secondary | ICD-10-CM | POA: Insufficient documentation

## 2011-06-06 DIAGNOSIS — T82898A Other specified complication of vascular prosthetic devices, implants and grafts, initial encounter: Secondary | ICD-10-CM | POA: Insufficient documentation

## 2011-06-06 DIAGNOSIS — Y832 Surgical operation with anastomosis, bypass or graft as the cause of abnormal reaction of the patient, or of later complication, without mention of misadventure at the time of the procedure: Secondary | ICD-10-CM | POA: Insufficient documentation

## 2011-06-06 MED ORDER — IOHEXOL 300 MG/ML  SOLN: 60.0000 mL | Freq: Once | INTRAMUSCULAR | Status: AC | PRN

## 2011-07-03 ENCOUNTER — Encounter: Payer: Self-pay | Admitting: Cardiology

## 2011-07-03 DIAGNOSIS — Z951 Presence of aortocoronary bypass graft: Secondary | ICD-10-CM | POA: Insufficient documentation

## 2011-07-03 DIAGNOSIS — D649 Anemia, unspecified: Secondary | ICD-10-CM | POA: Insufficient documentation

## 2011-07-03 DIAGNOSIS — E785 Hyperlipidemia, unspecified: Secondary | ICD-10-CM | POA: Insufficient documentation

## 2011-07-03 DIAGNOSIS — I10 Essential (primary) hypertension: Secondary | ICD-10-CM | POA: Insufficient documentation

## 2011-07-03 DIAGNOSIS — R9430 Abnormal result of cardiovascular function study, unspecified: Secondary | ICD-10-CM | POA: Insufficient documentation

## 2011-07-03 DIAGNOSIS — Z888 Allergy status to other drugs, medicaments and biological substances status: Secondary | ICD-10-CM | POA: Insufficient documentation

## 2011-07-03 DIAGNOSIS — I2510 Atherosclerotic heart disease of native coronary artery without angina pectoris: Secondary | ICD-10-CM | POA: Insufficient documentation

## 2011-07-04 ENCOUNTER — Encounter: Payer: Self-pay | Admitting: Cardiology

## 2011-07-04 ENCOUNTER — Ambulatory Visit (INDEPENDENT_AMBULATORY_CARE_PROVIDER_SITE_OTHER): Payer: Medicare Other | Admitting: Cardiology

## 2011-07-04 DIAGNOSIS — I2510 Atherosclerotic heart disease of native coronary artery without angina pectoris: Secondary | ICD-10-CM

## 2011-07-04 DIAGNOSIS — R42 Dizziness and giddiness: Secondary | ICD-10-CM

## 2011-07-04 DIAGNOSIS — I10 Essential (primary) hypertension: Secondary | ICD-10-CM

## 2011-07-04 DIAGNOSIS — K922 Gastrointestinal hemorrhage, unspecified: Secondary | ICD-10-CM

## 2011-07-04 NOTE — Assessment & Plan Note (Signed)
Blood pressure is treated and stable.  No change in therapy.  I will see her back in 6 months.

## 2011-07-04 NOTE — Assessment & Plan Note (Signed)
She has not been having any significant vertigo.  No further workup.

## 2011-07-04 NOTE — Assessment & Plan Note (Signed)
Because of her GI bleeding problems she cannot take aspirin.  This is unfortunate from the viewpoint of coronary disease.  However we have no other choice at this time.

## 2011-07-04 NOTE — Progress Notes (Signed)
HPI Patient is seen today for cardiology followup.  I saw her last in February, 2012.  She has coronary disease.  She is post CABG in 2003.  She is not having any significant symptoms.  She is on the list for renal transplant.  He had a nuclear scan in June, 2011, showed no ischemia.  This was done specifically to be sure that she could be cleared for renal transplant.  She goes about full activities.  She is being dialyzed.  She had a significant GI bleed in January of 2012.  She cannot take aspirin.  She does not receive heparin at the time of her dialysis.  As part of the evaluation today I have reviewed completely her records from the old chart.  I have updated the electronic medical record completely. Allergies  Allergen Reactions  . Asa Arthritis Strength-Antacid (Aspirin Buff (Al Hyd-Mg Hyd))   . Atorvastatin   . Lactulose     Current Outpatient Prescriptions  Medication Sig Dispense Refill  . allopurinol (ZYLOPRIM) 300 MG tablet Take 300 mg by mouth daily.        . B Complex-C-Folic Acid (DIALYVITE 800) 0.8 MG TABS Take by mouth daily.        . Biotin 1000 MCG tablet Take 1,000 mcg by mouth daily.        . Calcium Acetate 667 MG TABS Take 1 tablet by mouth 3 (three) times daily.        Marland Kitchen ezetimibe-simvastatin (VYTORIN) 10-80 MG per tablet Take 1 tablet by mouth at bedtime.        . fluticasone (FLONASE) 50 MCG/ACT nasal spray Place 2 sprays into the nose daily.        Marland Kitchen omeprazole (PRILOSEC) 20 MG capsule Take 20 mg by mouth daily.        . pioglitazone (ACTOS) 45 MG tablet Take 45 mg by mouth daily.        . salmeterol (SEREVENT) 50 MCG/DOSE diskus inhaler Inhale 2 puffs into the lungs 2 (two) times daily.          History   Social History  . Marital Status: Divorced    Spouse Name: N/A    Number of Children: N/A  . Years of Education: N/A   Occupational History  . Not on file.   Social History Main Topics  . Smoking status: Former Smoker    Quit date: 11/21/1983  .  Smokeless tobacco: Not on file  . Alcohol Use: No  . Drug Use: No  . Sexually Active: Not on file   Other Topics Concern  . Not on file   Social History Narrative  . No narrative on file    Family History  Problem Relation Age of Onset  . Cancer Neg Hx     Past Medical History  Diagnosis Date  . 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 GI bleed 11/26/2010    January, 2012 , AVM  . Asthma   . Coronary artery disease     cath 11/2007, grafts patent /  Nuclear, June, 2011, prior inferior MI with mild peri-infarct ischemia, anterior breast attenuation, EF 67%, done for renal transplant assessment  . Diabetes mellitus     type 2  . Hyperlipidemia   . Hypertension   . Gout   . Osteoporosis   . ESRD on dialysis   . Aspirin  allergy     possible asa allergy - tolerates low-dose aspirin  . Hyperparathyroidism   . Pneumonia 08/2010    Hospitalization, October, 2011  . Hx of CABG     2003  . Ejection fraction     EF 60%, echo, January, 2009 / EF 67%, nuclear, June, 2011    Past Surgical History  Procedure Date  . Coronary artery bypass graft 2003  . Vesicovaginal fistula closure w/ tah 1984  . Stress cardiolite 04/01/2007  . Lower arterial 08/19/2002    ROS  Patient denies fever, chills, headache, sweats, rash, change in vision, change in hearing, chest pain, cough, nausea vomiting, urinary symptoms.  All other systems are reviewed and are negative.  PHYSICAL EXAM Patient looks quite good.  She is oriented to person time and place.  Affect is normal.  Head is atraumatic.  There is no xanthelasma.  Lungs are clear.  Respiratory effort is nonlabored.  Cardiac exam reveals an S1-S2.  No clicks or significant murmurs.  The abdomen is soft.  There is no peripheral edema.  There is no musculoskeletal deformities.  Her shunt site is stable.  There are no skin rashes. Filed Vitals:   07/04/11 0935  BP:  132/69  Pulse: 67  Height: 5\' 3"  (1.6 m)  Weight: 141 lb (63.957 kg)    EKG He has done today and reviewed by me.  It is normal.  There is no significant change.  ASSESSMENT & PLAN

## 2011-07-04 NOTE — Patient Instructions (Signed)
Your physician recommends that you schedule a follow-up appointment in: 6 months  

## 2011-07-04 NOTE — Assessment & Plan Note (Signed)
Coronary disease is stable.  She does not need any further workup at this time. 

## 2011-08-04 HISTORY — PX: KIDNEY TRANSPLANT: SHX239

## 2011-08-09 LAB — I-STAT 8, (EC8 V) (CONVERTED LAB)
Bicarbonate: 26.9 — ABNORMAL HIGH
HCT: 41
Hemoglobin: 13.9
Operator id: 151321
Potassium: 4
Sodium: 140
TCO2: 28

## 2011-08-09 LAB — COMPREHENSIVE METABOLIC PANEL
Albumin: 2.9 — ABNORMAL LOW
Alkaline Phosphatase: 67
BUN: 50 — ABNORMAL HIGH
Calcium: 9
Creatinine, Ser: 3.64 — ABNORMAL HIGH
Potassium: 3.8
Total Protein: 5.8 — ABNORMAL LOW

## 2011-08-09 LAB — POCT CARDIAC MARKERS
CKMB, poc: <1 — ABNORMAL LOW
Myoglobin, poc: 204
Operator id: 151321
Troponin i, poc: <0.05
Troponin i, poc: <0.05

## 2011-08-09 LAB — LIPID PANEL
Cholesterol: 115
HDL: 41
LDL Cholesterol: 62
Total CHOL/HDL Ratio: 2.8
Triglycerides: 59

## 2011-08-09 LAB — CBC
HCT: 35.1 — ABNORMAL LOW
HCT: 37.5
HCT: 42.8
Hemoglobin: 13.9
MCV: 85.6
Platelets: 158
Platelets: 167
RBC: 4.38
RBC: 4.96
RDW: 21 — ABNORMAL HIGH
WBC: 6.9
WBC: 7.1

## 2011-08-09 LAB — BASIC METABOLIC PANEL
CO2: 28
Glucose, Bld: 76
Potassium: 4.2
Sodium: 140

## 2011-08-09 LAB — PROTIME-INR
INR: 0.9
Prothrombin Time: 12.7

## 2011-08-09 LAB — TROPONIN I: Troponin I: 0.02

## 2011-08-09 LAB — CARDIAC PANEL(CRET KIN+CKTOT+MB+TROPI)
CK, MB: 4.6 — ABNORMAL HIGH
Troponin I: 0.03

## 2011-08-09 LAB — DIFFERENTIAL
Eosinophils Relative: 4
Lymphocytes Relative: 28
Lymphs Abs: 1.9
Monocytes Relative: 9
Neutrophils Relative %: 58

## 2011-08-09 LAB — CK TOTAL AND CKMB (NOT AT ARMC): CK, MB: 4.7 — ABNORMAL HIGH

## 2011-08-09 LAB — HEPARIN LEVEL (UNFRACTIONATED): Heparin Unfractionated: 0.59

## 2011-08-30 LAB — CBC
HCT: 39.1
Hemoglobin: 12.9
MCHC: 32.8
RBC: 4.75
RDW: 19.4 — ABNORMAL HIGH

## 2011-08-30 LAB — IRON AND TIBC: Saturation Ratios: 40

## 2011-08-31 LAB — CBC
Hemoglobin: 12.2
MCHC: 33.1
RBC: 4.39
WBC: 6.2

## 2011-08-31 LAB — IRON AND TIBC
TIBC: 315
UIBC: 278

## 2011-08-31 LAB — FERRITIN: Ferritin: 849 — ABNORMAL HIGH (ref 10–291)

## 2011-09-01 LAB — IRON AND TIBC
Iron: 54
Saturation Ratios: 19 — ABNORMAL LOW
TIBC: 285
UIBC: 231

## 2011-09-01 LAB — CBC
HCT: 31.4 — ABNORMAL LOW
Platelets: 132 — ABNORMAL LOW
RDW: 20 — ABNORMAL HIGH
WBC: 6.3

## 2011-09-04 LAB — CBC
Hemoglobin: 10.3 — ABNORMAL LOW
MCHC: 33
Platelets: 140 — ABNORMAL LOW
RDW: 19.4 — ABNORMAL HIGH

## 2011-09-04 LAB — IRON AND TIBC
Iron: 71
Saturation Ratios: 27
TIBC: 263

## 2011-09-06 LAB — CBC
Hemoglobin: 9.2 — ABNORMAL LOW
MCHC: 33.1
RBC: 3.33 — ABNORMAL LOW
WBC: 5.5

## 2011-09-06 LAB — IRON AND TIBC
Iron: 40 — ABNORMAL LOW
Saturation Ratios: 15 — ABNORMAL LOW
TIBC: 271

## 2011-09-06 LAB — POCT I-STAT 4, (NA,K, GLUC, HGB,HCT)
Glucose, Bld: 86
HCT: 32 — ABNORMAL LOW
Hemoglobin: 10.9 — ABNORMAL LOW

## 2011-10-16 ENCOUNTER — Emergency Department (HOSPITAL_COMMUNITY): Payer: Medicare Other

## 2011-10-16 ENCOUNTER — Encounter (HOSPITAL_COMMUNITY): Payer: Self-pay | Admitting: *Deleted

## 2011-10-16 ENCOUNTER — Emergency Department (HOSPITAL_COMMUNITY)
Admission: EM | Admit: 2011-10-16 | Discharge: 2011-10-16 | Disposition: A | Discharge status: Other Acute Inpatient Hospital | Payer: Medicare Other | Attending: Emergency Medicine | Admitting: Emergency Medicine

## 2011-10-16 DIAGNOSIS — R0602 Shortness of breath: Secondary | ICD-10-CM | POA: Insufficient documentation

## 2011-10-16 DIAGNOSIS — Z951 Presence of aortocoronary bypass graft: Secondary | ICD-10-CM | POA: Insufficient documentation

## 2011-10-16 DIAGNOSIS — I2510 Atherosclerotic heart disease of native coronary artery without angina pectoris: Secondary | ICD-10-CM | POA: Insufficient documentation

## 2011-10-16 DIAGNOSIS — I120 Hypertensive chronic kidney disease with stage 5 chronic kidney disease or end stage renal disease: Secondary | ICD-10-CM | POA: Insufficient documentation

## 2011-10-16 DIAGNOSIS — R05 Cough: Secondary | ICD-10-CM | POA: Insufficient documentation

## 2011-10-16 DIAGNOSIS — R0600 Dyspnea, unspecified: Secondary | ICD-10-CM

## 2011-10-16 DIAGNOSIS — J45909 Unspecified asthma, uncomplicated: Secondary | ICD-10-CM | POA: Insufficient documentation

## 2011-10-16 DIAGNOSIS — Z9889 Other specified postprocedural states: Secondary | ICD-10-CM | POA: Insufficient documentation

## 2011-10-16 DIAGNOSIS — R0989 Other specified symptoms and signs involving the circulatory and respiratory systems: Secondary | ICD-10-CM | POA: Insufficient documentation

## 2011-10-16 DIAGNOSIS — E119 Type 2 diabetes mellitus without complications: Secondary | ICD-10-CM | POA: Insufficient documentation

## 2011-10-16 DIAGNOSIS — E785 Hyperlipidemia, unspecified: Secondary | ICD-10-CM | POA: Insufficient documentation

## 2011-10-16 DIAGNOSIS — R0609 Other forms of dyspnea: Secondary | ICD-10-CM | POA: Insufficient documentation

## 2011-10-16 DIAGNOSIS — R059 Cough, unspecified: Secondary | ICD-10-CM | POA: Insufficient documentation

## 2011-10-16 DIAGNOSIS — N186 End stage renal disease: Secondary | ICD-10-CM | POA: Insufficient documentation

## 2011-10-16 DIAGNOSIS — J189 Pneumonia, unspecified organism: Secondary | ICD-10-CM | POA: Insufficient documentation

## 2011-10-16 DIAGNOSIS — R0789 Other chest pain: Secondary | ICD-10-CM | POA: Insufficient documentation

## 2011-10-16 DIAGNOSIS — Z8639 Personal history of other endocrine, nutritional and metabolic disease: Secondary | ICD-10-CM | POA: Insufficient documentation

## 2011-10-16 DIAGNOSIS — Z862 Personal history of diseases of the blood and blood-forming organs and certain disorders involving the immune mechanism: Secondary | ICD-10-CM | POA: Insufficient documentation

## 2011-10-16 HISTORY — DX: Kidney transplant status: Z94.0

## 2011-10-16 LAB — CBC
MCH: 28 pg (ref 26.0–34.0)
MCV: 81.5 fL (ref 78.0–100.0)
Platelets: 171 10*3/uL (ref 150–400)
RBC: 3.46 MIL/uL — ABNORMAL LOW (ref 3.87–5.11)

## 2011-10-16 LAB — POCT I-STAT 3, ART BLOOD GAS (G3+)
Bicarbonate: 22.8 mEq/L (ref 20.0–24.0)
TCO2: 24 mmol/L (ref 0–100)
pCO2 arterial: 36.2 mmHg (ref 35.0–45.0)
pH, Arterial: 7.406 — ABNORMAL HIGH (ref 7.350–7.400)

## 2011-10-16 LAB — BASIC METABOLIC PANEL
CO2: 24 mEq/L (ref 19–32)
Calcium: 9.7 mg/dL (ref 8.4–10.5)
Glucose, Bld: 118 mg/dL — ABNORMAL HIGH (ref 70–99)
Sodium: 138 mEq/L (ref 135–145)

## 2011-10-16 LAB — GLUCOSE, CAPILLARY: Glucose-Capillary: 174 mg/dL — ABNORMAL HIGH (ref 70–99)

## 2011-10-16 MED ORDER — IPRATROPIUM BROMIDE 0.02 % IN SOLN
RESPIRATORY_TRACT | Status: AC
  Administered 2011-10-16: 0.5 mg via RESPIRATORY_TRACT
  Filled 2011-10-16: qty 2.5

## 2011-10-16 MED ORDER — ALBUTEROL (5 MG/ML) CONTINUOUS INHALATION SOLN
INHALATION_SOLUTION | RESPIRATORY_TRACT | Status: AC
  Administered 2011-10-16: 5 mg via RESPIRATORY_TRACT
  Filled 2011-10-16: qty 20

## 2011-10-16 MED ORDER — IPRATROPIUM BROMIDE 0.02 % IN SOLN
0.5000 mg | Freq: Once | RESPIRATORY_TRACT | Status: AC
  Administered 2011-10-16 (×2): 0.5 mg via RESPIRATORY_TRACT
  Filled 2011-10-16: qty 2.5

## 2011-10-16 MED ORDER — ALBUTEROL SULFATE (5 MG/ML) 0.5% IN NEBU
5.0000 mg | INHALATION_SOLUTION | Freq: Once | RESPIRATORY_TRACT | Status: AC
  Administered 2011-10-16 (×2): 5 mg via RESPIRATORY_TRACT
  Filled 2011-10-16: qty 1

## 2011-10-16 MED ORDER — PIPERACILLIN-TAZOBACTAM 3.375 G IVPB
3.3750 g | Freq: Once | INTRAVENOUS | Status: AC
  Administered 2011-10-16: 3.375 g via INTRAVENOUS
  Filled 2011-10-16: qty 50

## 2011-10-16 MED ORDER — VANCOMYCIN HCL IN DEXTROSE 1-5 GM/200ML-% IV SOLN
1000.0000 mg | Freq: Once | INTRAVENOUS | Status: DC
  Filled 2011-10-16: qty 200

## 2011-10-16 MED ORDER — ONDANSETRON HCL 4 MG/2ML IJ SOLN: 4.0000 mg | Freq: Once | INTRAMUSCULAR | Status: DC

## 2011-10-16 MED ORDER — ONDANSETRON HCL 4 MG/2ML IJ SOLN
INTRAMUSCULAR | Status: AC
  Filled 2011-10-16: qty 2

## 2011-10-16 MED ORDER — ALBUTEROL SULFATE (5 MG/ML) 0.5% IN NEBU
INHALATION_SOLUTION | RESPIRATORY_TRACT | Status: AC
  Filled 2011-10-16: qty 0.5

## 2011-10-16 MED ORDER — CIPROFLOXACIN IN D5W 400 MG/200ML IV SOLN: 400.0000 mg | Freq: Two times a day (BID) | INTRAVENOUS | Status: DC

## 2011-10-16 MED ORDER — ACETAMINOPHEN 325 MG PO TABS
ORAL_TABLET | ORAL | Status: AC
  Filled 2011-10-16: qty 3

## 2011-10-16 MED ORDER — ONDANSETRON HCL 4 MG/2ML IJ SOLN
4.0000 mg | Freq: Once | INTRAMUSCULAR | Status: AC
  Administered 2011-10-16: 4 mg via INTRAVENOUS

## 2011-10-16 MED ORDER — SODIUM CHLORIDE 0.9 % IV BOLUS (SEPSIS)
1000.0000 mL | Freq: Once | INTRAVENOUS | Status: AC
  Administered 2011-10-16: 1000 mL via INTRAVENOUS

## 2011-10-16 MED ORDER — IOHEXOL 300 MG/ML  SOLN
80.0000 mL | Freq: Once | INTRAMUSCULAR | Status: AC | PRN
  Administered 2011-10-16: 80 mL via INTRAVENOUS

## 2011-10-16 NOTE — ED Notes (Signed)
Gave old and new ECG to Dr. Lynelle Doctor after I performed. 10:00 AM JG.

## 2011-10-16 NOTE — ED Notes (Signed)
Patient states she has been coughing x 3 days - non-productive.

## 2011-10-16 NOTE — ED Provider Notes (Addendum)
History     CSN: [id number] Arrival date & time: 10/16/2011  9:32 AM   First MD Initiated Contact with Patient 10/16/11 [phone]      Chief Complaint  Patient presents with  . Shortness of Breath  . Chest Pain    (Consider location/radiation/quality/duration/timing/severity/associated sxs/prior treatment) HPI Patient presents to the emergency room complaining of shortness of breath this morning.  Patient states she has history of renal transplant back in September of this year. Patient states she had been doing well with that. Over the last 2 days she started having some cough. She thought it might of been her asthma although she has not had to use any breathing treatments for over a year. Patient had some chest discomfort yesterday evening. She mostly noticed that she been coughing and feels like she is to bring something up but is unable to do so. She is getting more short of breath when she exerts herself. She has not noticed any swelling. She denied fevers and high temperatures measured at home at 99.  Past Medical History  Diagnosis Date  . 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 GI bleed 11/26/2010    January, 2012 , AVM  . Asthma   . Coronary artery disease     cath 11/2007, grafts patent /  Nuclear, June, 2011, prior inferior MI with mild peri-infarct ischemia, anterior breast attenuation, EF 67%, done for renal transplant assessment  . Diabetes mellitus     type 2  . Hyperlipidemia   . Hypertension   . Gout   . Osteoporosis   . ESRD on dialysis   . Aspirin allergy     possible asa allergy - tolerates low-dose aspirin  . Hyperparathyroidism   . Pneumonia 08/2010    Hospitalization, October, 2011  . Hx of CABG     2003  . Ejection fraction     EF 60%, echo, January, 2009 / EF 67%, nuclear, June, 2011    Past Surgical History  Procedure Date  . Coronary artery bypass graft 2003  .  Vesicovaginal fistula closure w/ tah 1984  . Stress cardiolite 04/01/2007  . Lower arterial 08/19/2002    Family History  Problem Relation Age of Onset  . Cancer Neg Hx     History  Substance Use Topics  . Smoking status: Former Smoker    Quit date: 11/21/1983  . Smokeless tobacco: Not on file  . Alcohol Use: No    OB History    Grav Para Term Preterm Abortions TAB SAB Ect Mult Living                  Review of Systems  Respiratory: Positive for cough and chest tightness.   Gastrointestinal: Negative for abdominal distention.  Neurological: Negative for tremors and weakness.  All other systems reviewed and are negative.    Allergies  Asa arthritis strength-antacid; Atorvastatin; and Lactulose  Home Medications   Current Outpatient Rx  Name Route Sig Dispense Refill  . ALLOPURINOL 300 MG PO TABS Oral Take 300 mg by mouth daily.      Marland Kitchen DIALYVITE 800 0.8 MG PO TABS Oral Take by mouth daily.      Marland Kitchen BIOTIN 1000 MCG PO TABS Oral Take 1,000 mcg by mouth daily.      Marland Kitchen CALCIUM ACETATE 667 MG PO TABS Oral Take 1 tablet by mouth 3 (  three) times daily.      Marland Kitchen EZETIMIBE-SIMVASTATIN 10-80 MG PO TABS Oral Take 1 tablet by mouth at bedtime.      Marland Kitchen FLUTICASONE PROPIONATE 50 MCG/ACT NA SUSP Nasal Place 2 sprays into the nose daily.      Marland Kitchen OMEPRAZOLE 20 MG PO CPDR Oral Take 20 mg by mouth daily.      Marland Kitchen PIOGLITAZONE HCL 45 MG PO TABS Oral Take 45 mg by mouth daily.      Marland Kitchen SALMETEROL XINAFOATE 50 MCG/DOSE IN AEPB Inhalation Inhale 2 puffs into the lungs 2 (two) times daily.        BP 145/53  Pulse 118  Temp(Src) 98.9 F (37.2 C) (Oral)  Resp 24  SpO2 93%  Physical Exam  Nursing note and vitals reviewed. Constitutional: She appears well-developed and well-nourished. She appears distressed.  HENT:  Head: Normocephalic and atraumatic.  Right Ear: External ear normal.  Left Ear: External ear normal.  Eyes: Conjunctivae are normal. Right eye exhibits no discharge. Left eye  exhibits no discharge. No scleral icterus.  Neck: Neck supple. No JVD present. No tracheal deviation present.  Cardiovascular: Normal rate, regular rhythm, normal heart sounds and intact distal pulses.   Pulmonary/Chest: Breath sounds normal. No stridor. She has no wheezes. She has no rales.       Diminished sounds on the left side, no wheezing, labored breathing, still able to speak in sentences however with increased effort  Abdominal: Soft. Bowel sounds are normal. She exhibits no distension. There is no tenderness. There is no rebound and no guarding.  Musculoskeletal: She exhibits no edema and no tenderness.  Neurological: She is alert. She has normal strength. No sensory deficit. Cranial nerve deficit:  no gross defecits noted. She exhibits normal muscle tone. She displays no seizure activity. Coordination normal.  Skin: Skin is warm and dry. No rash noted.  Psychiatric: She has a normal mood and affect.    ED Course  Procedures (including critical care time)  Date: 10/16/2011  Rate: 121  Rhythm: sinus tachycardia  QRS Axis: normal  Intervals: normal  ST/T Wave abnormalities: normal  Conduction Disutrbances:none  Narrative Interpretation: rate faster since last tracing  Old EKG Reviewed: changes noted  Medications  vancomycin (VANCOCIN) IVPB 1000 mg/200 mL premix (not administered)  piperacillin-tazobactam (ZOSYN) IVPB 3.375 g (not administered)  ciprofloxacin (CIPRO) IVPB 400 mg (not administered)  albuterol (PROVENTIL) (5 MG/ML) 0.5% nebulizer solution 5 mg (5 mg Nebulization Given 10/16/11 1050)  ipratropium (ATROVENT) nebulizer solution 0.5 mg (0.5 mg Nebulization Given 10/16/11 1050)  ondansetron (ZOFRAN) injection 4 mg (4 mg Intravenous Given 10/16/11 1335)  iohexol (OMNIPAQUE) 300 MG/ML injection 80 mL (80 mL Intravenous Contrast Given 10/16/11 1419)     Labs Reviewed  PRO B NATRIURETIC PEPTIDE - Abnormal; Notable for the following:    BNP, POC 246.9 (*)    All  other components within normal limits  BASIC METABOLIC PANEL - Abnormal; Notable for the following:    Glucose, Bld 118 (*)    GFR calc non Af Amer 56 (*)    GFR calc Af Amer 65 (*)    All other components within normal limits  CBC - Abnormal; Notable for the following:    WBC 11.3 (*)    RBC 3.46 (*)    Hemoglobin 9.7 (*)    HCT 28.2 (*)    RDW 17.6 (*)    All other components within normal limits  D-DIMER, QUANTITATIVE - Abnormal; Notable for the following:  D-Dimer, Quant 1.12 (*)    All other components within normal limits  POCT I-STAT 3, BLOOD GAS (G3+) - Abnormal; Notable for the following:    pH, Arterial 7.406 (*)    pO2, Arterial 102.0 (*)    All other components within normal limits  GLUCOSE, CAPILLARY - Abnormal; Notable for the following:    Glucose-Capillary 174 (*)    All other components within normal limits  POCT I-STAT TROPONIN I  I-STAT 3, BLOOD GAS (G3+)  I-STAT TROPONIN I  CULTURE, BLOOD (ROUTINE X 2)  CULTURE, BLOOD (ROUTINE X 2)   Dg Chest 2 View  10/16/2011  *RADIOLOGY REPORT*  Clinical Data: Chest pain, short of breath, low grade fever  CHEST - 2 VIEW  Comparison: Portable chest x-ray of 11/26/2010  Findings: There is minimal patchy opacity in the region of the right middle lobe medially which may represent pneumonia. Otherwise the lungs are clear.  There is some peribronchial thickening present.  Mild cardiomegaly is stable.  Median sternotomy sutures are noted from prior CABG with more cephalad of the sutures being broken.  The bones are diffusely osteopenic.  IMPRESSION: Vague opacity in the medial right middle lobe.  Cannot exclude pneumonia.  Consider follow-up.  Original Report Authenticated By: Juline Patch, M.D.   Ct Angio Chest W/cm &/or Wo Cm  10/16/2011  *RADIOLOGY REPORT*  Clinical Data:  Shortness of breath.  Elevated D-dimer.  CT ANGIOGRAPHY CHEST WITH CONTRAST  Technique:  Multidetector CT imaging of the chest was performed using the  standard protocol during bolus administration of intravenous contrast.  Multiplanar CT image reconstructions including MIPs were obtained to evaluate the vascular anatomy.  Contrast: 80mL OMNIPAQUE IOHEXOL 300 MG/ML IV SOLN  Comparison:  CT chest 10/27/2009.  Findings:  The chest wall is unremarkable.  No breast masses, supraclavicular or axillary lymphadenopathy.  The bony thorax is intact.  There are surgical changes from a median sternotomy.  The thoracic vertebral bodies are normally aligned.  No spinal canal compromise.  The heart is normal in size.  No pericardial effusion.  No mediastinal or hilar lymphadenopathy.  The aorta is normal in caliber.  Mild atherosclerotic changes.  Dense coronary artery calcifications.  The pulmonary arterial tree is fairly well opacified.  No definite filling defects to suggest pulmonary emboli.  Examination of the lung parenchyma demonstrates emphysematous changes with areas of compressive atelectasis.  Patchy right middle lobe and right lower lobe airspace opacity could reflect early or clearing pneumonia.  No pulmonary edema or pleural effusions.  The upper abdomen is unremarkable.  Review of the MIP images confirms the above findings.  IMPRESSION:  1.  No CT findings for pulmonary embolism. 2.  Atherosclerotic changes involving the aorta but no dissection or aneurysm. 3.  Patchy basilar clearing or early bronchopneumonia. 4.  Extensive coronary artery calcifications.  Original Report Authenticated By: P. Loralie Champagne, M.D.     1. Dyspnea   2. Healthcare-associated pneumonia       MDM  The patient still remains tachycardic and tachypneic. She does have a low-grade temperature as well. CT scan does not show any evidence of pulmonary embolism however there could be an early pneumonia. With her history of recent transplant: The patient to the hospital for IV antibiotics and cover for healthcare associated pneumonia. At this time I will consult nephrology service to  see if the patient will be admitted here to [facility] versus transfer to the [facility] transplant service.  Celene Kras,  MD 10/16/11 1543  I spoke with Dr. Caryn Section from the nephrology service here at [facility] and he recommends transfer to [facility].  I spoke with Dr. Fransico Michael from the transplant service at [facility] and he agrees to accept the patient for admission at [facility] for further treatment. 4:18 PM    Celene Kras, MD 10/16/11 [phone]

## 2011-10-16 NOTE — ED Notes (Signed)
Patient resting with NAD at this time.  

## 2011-10-16 NOTE — ED Notes (Signed)
Pt a/o x3, NAD, no verbal complaints at this time.  Pt denies pain and nausea at this time.

## 2011-10-16 NOTE — ED Notes (Signed)
Patients given Zofran at 1325 and is feeling better. Patient is in CT now.

## 2011-10-16 NOTE — ED Notes (Signed)
Patient sleeping at this time with NAD

## 2011-10-16 NOTE — ED Notes (Addendum)
Patient states pain is resolved except when she coughs, Patient has been sleeping for 30 minutes without oxygen and her saturation was 88%, administered oxygen at 2L via [place] saturation at 98% on 2L. EDP aware of this information regarding oxygen for the patient.

## 2011-10-16 NOTE — ED Notes (Signed)
Patient vomiting after getting up to bedside.

## 2011-10-16 NOTE — ED Notes (Signed)
Patient resting and family at bedside. Patient is aware she is going to be transferred to [facility], meanwhile patient was told she is moving to [facility] until [facility] is ready for her. Report given to Tia.

## 2011-10-16 NOTE — ED Notes (Signed)
Nasal cannula changed w/ new one.  Pt given warm blanket and ice water. She was placed on a bedpan.

## 2011-10-16 NOTE — ED Notes (Signed)
Spoke with [facility] Bed Control to verify that receiving MD has placed admission orders and bed request. States that they have request and pt is pending for bed. They will call when pt has bed ready.

## 2011-10-16 NOTE — ED Notes (Addendum)
Patient states she started having asemtha like symptoms X 3 days & states chest pain started yesterday evening and stopped, this morning when coming out of bathroom pressure and pain in her chest started. Patient had right kidney transplant at [facility] on August 04, 2011. Patient has a graft in her left forearm that was used for dialysis prior to transplant. Patient denies n/v.

## 2011-10-16 NOTE — ED Notes (Signed)
Patient transfer to [id number], RN receiving nurse.

## 2011-10-16 NOTE — ED Notes (Signed)
I applied a restriction band on pt's left arm, pt has a shunt in it. 10:00am JG. Marland Kitchen

## 2011-10-16 NOTE — ED Notes (Addendum)
Received telephone call from [place] in [employer]'s transfer center to inform that there was a bed assignment for this pt. The pt is being accepted by Dr. Fransico Michael and will be going to the transplant unit room 2315.  Called to give pt report and spoke with [place] Citizen, Charity fundraiser.Called [facility] for pt transport to [facility] [employer].

## 2011-10-16 NOTE — ED Notes (Signed)
Pt urinated approximately 200 cc of clear yellow UOP.  Pt repositioned in bed for comfort and preservation of skin integrity.

## 2011-10-23 LAB — CULTURE, BLOOD (ROUTINE X 2)
Culture  Setup Time: 201211270029
Culture: NO GROWTH

## 2011-11-02 ENCOUNTER — Other Ambulatory Visit: Payer: Self-pay | Admitting: Physician Assistant

## 2011-11-02 ENCOUNTER — Other Ambulatory Visit: Payer: Self-pay | Admitting: Endocrinology

## 2011-11-02 DIAGNOSIS — Z1231 Encounter for screening mammogram for malignant neoplasm of breast: Secondary | ICD-10-CM

## 2011-12-07 ENCOUNTER — Ambulatory Visit (HOSPITAL_COMMUNITY)
Admission: RE | Admit: 2011-12-07 | Discharge: 2011-12-07 | Disposition: A | Discharge status: Home / Self Care | Payer: Medicare Other | Source: Ambulatory Visit | Attending: Endocrinology | Admitting: Endocrinology

## 2011-12-07 DIAGNOSIS — Z1231 Encounter for screening mammogram for malignant neoplasm of breast: Secondary | ICD-10-CM

## 2012-01-09 ENCOUNTER — Ambulatory Visit: Payer: Medicare Other | Admitting: Cardiology

## 2012-02-21 ENCOUNTER — Ambulatory Visit (INDEPENDENT_AMBULATORY_CARE_PROVIDER_SITE_OTHER): Payer: Medicare Other | Admitting: Cardiology

## 2012-02-21 ENCOUNTER — Encounter: Payer: Self-pay | Admitting: Cardiology

## 2012-02-21 VITALS — BP 120/64 | HR 84 | Ht 63.0 in | Wt 157.0 lb

## 2012-02-21 DIAGNOSIS — I2510 Atherosclerotic heart disease of native coronary artery without angina pectoris: Secondary | ICD-10-CM

## 2012-02-21 DIAGNOSIS — Z92241 Personal history of systemic steroid therapy: Secondary | ICD-10-CM | POA: Insufficient documentation

## 2012-02-21 DIAGNOSIS — Z940 Kidney transplant status: Secondary | ICD-10-CM | POA: Insufficient documentation

## 2012-02-21 NOTE — Patient Instructions (Signed)
Your physician wants you to follow-up in: 1 year. You will receive a reminder letter in the mail two months in advance. If you don't receive a letter, please call our office to schedule the follow-up appointment.  

## 2012-02-21 NOTE — Assessment & Plan Note (Signed)
She's doing very well after her renal transplant. No change in therapy.

## 2012-02-21 NOTE — Assessment & Plan Note (Signed)
Coronary disease is stable. Her last nuclear scan was in June, 2011. There was no significant ischemia. There was breast attenuation. She's not having any significant symptoms. She needs no further workup.

## 2012-02-21 NOTE — Assessment & Plan Note (Signed)
Patient is on prednisone after her renal transplant.

## 2012-02-21 NOTE — Progress Notes (Signed)
HPI Patient is here for followup of coronary disease. I'm excited to learn that she received a kidney transplant September, 2012. She is tolerating it well and has not needed any recurrent dialysis. She's doing well. Her cardiac status has been stable. She's not having any significant chest pain.  Allergies  Allergen Reactions  . Asa Arthritis Strength-Antacid (Aspirin Buffered)   . Atorvastatin   . Banana Nausea And Vomiting  . Lactulose     Current Outpatient Prescriptions  Medication Sig Dispense Refill  . amLODipine (NORVASC) 5 MG tablet Take 10 mg by mouth daily.       Marland Kitchen aspirin EC 81 MG tablet Take 81 mg by mouth daily.        . calcitRIOL (ROCALTROL) 0.25 MCG capsule Take 0.25 mcg by mouth daily.      . fluticasone (FLONASE) 50 MCG/ACT nasal spray Place 2 sprays into the nose daily as needed. For nasal congestion      . lisinopril (PRINIVIL,ZESTRIL) 2.5 MG tablet Take 2.5 mg by mouth daily.      . mycophenolate (CELLCEPT) 250 MG capsule Take 250 mg by mouth 3 (three) times daily. Take three tablets twice daily      . omeprazole (PRILOSEC) 20 MG capsule Take 40 mg by mouth daily.       . pravastatin (PRAVACHOL) 20 MG tablet Take 20 mg by mouth at bedtime.        . predniSONE (DELTASONE) 5 MG tablet Take 15 mg by mouth daily. Take 30mg  daily       . senna-docusate (SENOKOT-S) 8.6-50 MG per tablet Take 1-2 tablets by mouth 2 (two) times daily as needed. For constipation       . tacrolimus (PROGRAF) 1 MG capsule Take 8 mg by mouth 2 (two) times daily. Take 7mg  in the am and 8mg  in pm        History   Social History  . Marital Status: Divorced    Spouse Name: N/A    Number of Children: N/A  . Years of Education: N/A   Occupational History  . Not on file.   Social History Main Topics  . Smoking status: Former Smoker    Quit date: 11/21/1983  . Smokeless tobacco: Not on file  . Alcohol Use: No  . Drug Use: No  . Sexually Active: Not on file   Other Topics Concern  .  Not on file   Social History Narrative  . No narrative on file    Family History  Problem Relation Age of Onset  . Cancer Neg Hx     Past Medical History  Diagnosis Date  . 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 GI bleed 11/26/2010    January, 2012 , AVM  . Asthma   . Coronary artery disease     cath 11/2007, grafts patent /  Nuclear, June, 2011, prior inferior MI with mild peri-infarct ischemia, anterior breast attenuation, EF 67%, done for renal transplant assessment  . Diabetes mellitus     type 2  . Hyperlipidemia   . Hypertension   . Gout   . Osteoporosis   . ESRD on dialysis     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, 2012  . Aspirin allergy     possible asa allergy - tolerates low-dose aspirin  . Hyperparathyroidism   . Pneumonia 08/2010    Hospitalization, October, 2011  .  Hx of CABG     2003  . Ejection fraction     EF 60%, echo, January, 2009 / EF 67%, nuclear, June, 2011  . S/P kidney transplant     September, 2012, [facility]    Past Surgical History  Procedure Date  . Coronary artery bypass graft 2003  . Vesicovaginal fistula closure w/ tah 1984  . Stress cardiolite 04/01/2007  . Lower arterial 08/19/2002  . Nephrectomy transplanted organ  Right kidney    ROS  Patient denies fever, chills, headache, sweats, rash, change in vision, change in hearing, chest pain, cough, nausea vomiting, urinary symptoms. All other systems are reviewed and are negative.  PHYSICAL EXAM Patient is oriented to person time and place. Affect is normal. Head is atraumatic. Lungs are clear. Respiratory effort is nonlabored. Cardiac exam reveals S1 and S2. There no clicks or significant murmurs. The abdomen is soft. There is no peripheral edema.  Filed Vitals:   02/21/12 1124  BP: 120/64  Pulse: 84  Height: 5\' 3"  (1.6 m)  Weight: 157 lb (71.215 kg)     ASSESSMENT & PLAN

## 2012-02-26 ENCOUNTER — Other Ambulatory Visit (INDEPENDENT_AMBULATORY_CARE_PROVIDER_SITE_OTHER): Payer: Medicare Other

## 2012-02-26 ENCOUNTER — Ambulatory Visit (INDEPENDENT_AMBULATORY_CARE_PROVIDER_SITE_OTHER): Payer: Medicare Other | Admitting: Endocrinology

## 2012-02-26 ENCOUNTER — Encounter: Payer: Self-pay | Admitting: Endocrinology

## 2012-02-26 VITALS — BP 126/62 | HR 63 | Temp 98.2°F | Ht 63.0 in | Wt 156.8 lb

## 2012-02-26 DIAGNOSIS — D649 Anemia, unspecified: Secondary | ICD-10-CM

## 2012-02-26 DIAGNOSIS — E1129 Type 2 diabetes mellitus with other diabetic kidney complication: Secondary | ICD-10-CM

## 2012-02-26 DIAGNOSIS — D696 Thrombocytopenia, unspecified: Secondary | ICD-10-CM

## 2012-02-26 DIAGNOSIS — E538 Deficiency of other specified B group vitamins: Secondary | ICD-10-CM

## 2012-02-26 DIAGNOSIS — N058 Unspecified nephritic syndrome with other morphologic changes: Secondary | ICD-10-CM

## 2012-02-26 DIAGNOSIS — I2510 Atherosclerotic heart disease of native coronary artery without angina pectoris: Secondary | ICD-10-CM

## 2012-02-26 DIAGNOSIS — I10 Essential (primary) hypertension: Secondary | ICD-10-CM

## 2012-02-26 DIAGNOSIS — Z79899 Other long term (current) drug therapy: Secondary | ICD-10-CM | POA: Insufficient documentation

## 2012-02-26 DIAGNOSIS — M109 Gout, unspecified: Secondary | ICD-10-CM

## 2012-02-26 DIAGNOSIS — N2581 Secondary hyperparathyroidism of renal origin: Secondary | ICD-10-CM

## 2012-02-26 LAB — CBC WITH DIFFERENTIAL/PLATELET
Basophils Relative: 0.2 % (ref 0.0–3.0)
Eosinophils Absolute: 0 10*3/uL (ref 0.0–0.7)
Hemoglobin: 10.8 g/dL — ABNORMAL LOW (ref 12.0–15.0)
MCHC: 32.2 g/dL (ref 30.0–36.0)
MCV: 81.2 fl (ref 78.0–100.0)
Monocytes Absolute: 0.4 10*3/uL (ref 0.1–1.0)
Neutro Abs: 4.6 10*3/uL (ref 1.4–7.7)
RBC: 4.15 Mil/uL (ref 3.87–5.11)

## 2012-02-26 NOTE — Patient Instructions (Addendum)
blood tests are being requested for you today.  You will receive a letter with results. Please come in soon for a "medicare wellness" appointment.

## 2012-02-26 NOTE — Progress Notes (Signed)
Subjective:    Patient ID: [name], female    DOB: April 27, 1941, 71 y.o.   MRN: [id number]  HPI The state of at least three ongoing medical problems is addressed today: Pt returns for f/u of type 2 DM (2005).  she brings a record of her cbg's which i have reviewed today. It varies from 86-145.  It is in general higher as the day goes on.   HTN: denies chest pain  Anemia: sob. Past Medical History  Diagnosis Date  . 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 GI bleed 11/26/2010    January, 2012 , AVM  . Asthma   . Coronary artery disease     cath 11/2007, grafts patent /  Nuclear, June, 2011, prior inferior MI with mild peri-infarct ischemia, anterior breast attenuation, EF 67%, done for renal transplant assessment  . Diabetes mellitus     type 2  . Hyperlipidemia   . Hypertension   . Gout   . Osteoporosis   . ESRD on dialysis     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, 2012  . Aspirin allergy     possible asa allergy - tolerates low-dose aspirin  . Hyperparathyroidism   . Pneumonia 08/2010    Hospitalization, October, 2011  . Hx of CABG     2003  . Ejection fraction     EF 60%, echo, January, 2009 / EF 67%, nuclear, June, 2011  . S/P kidney transplant     September, 2012, [facility]  . H/O steroid therapy     Patient on prednisone after renal transplant.  . Kidney replaced by transplant 08/04/2011    Past Surgical History  Procedure Date  . Coronary artery bypass graft 2003  . Vesicovaginal fistula closure w/ tah 1984  . Stress cardiolite 04/01/2007  . Lower arterial 08/19/2002  . Nephrectomy transplanted organ  Right kidney    History   Social History  . Marital Status: Divorced    Spouse Name: N/A    Number of Children: N/A  . Years of Education: N/A   Occupational History  . Not on file.   Social History Main Topics  . Smoking status: Former Smoker    Quit date: 11/21/1983  . Smokeless  tobacco: Not on file  . Alcohol Use: No  . Drug Use: No  . Sexually Active: Not on file   Other Topics Concern  . Not on file   Social History Narrative  . No narrative on file    Current Outpatient Prescriptions on File Prior to Visit  Medication Sig Dispense Refill  . amLODipine (NORVASC) 5 MG tablet Take 10 mg by mouth daily.       . calcitRIOL (ROCALTROL) 0.25 MCG capsule Take 0.25 mcg by mouth daily.      . fluticasone (FLONASE) 50 MCG/ACT nasal spray Place 1 spray into the nose daily as needed. For nasal congestion      . glipiZIDE (GLUCOTROL) 5 MG tablet Take 5 mg by mouth daily.       Marland Kitchen lisinopril (PRINIVIL,ZESTRIL) 2.5 MG tablet Take 2.5 mg by mouth daily.      . mycophenolate (CELLCEPT) 250 MG capsule Take 250 mg by mouth 3 (three) times daily. Take three tablets twice daily      . omeprazole (PRILOSEC) 20 MG capsule Take 40 mg by mouth daily.       Marland Kitchen senna-docusate (  SENOKOT-S) 8.6-50 MG per tablet Take 1-2 tablets by mouth 2 (two) times daily as needed. For constipation       . tacrolimus (PROGRAF) 1 MG capsule Take 7mg  in the am and 8mg  in pm      . aspirin EC 81 MG tablet Take 81 mg by mouth daily.          Allergies  Allergen Reactions  . Asa Arthritis Strength-Antacid (Aspirin Buffered)   . Atorvastatin   . Banana Nausea And Vomiting  . Lactulose     Family History  Problem Relation Age of Onset  . Cancer Neg Hx     BP 126/62  Pulse 63  Temp(Src) 98.2 F (36.8 C) (Oral)  Ht 5\' 3"  (1.6 m)  Wt 156 lb 12.8 oz (71.124 kg)  BMI 27.78 kg/m2  SpO2 96%    Review of Systems denies hypoglycemia.  She has weight gain.      Objective:   Physical Exam VITAL SIGNS:  See vs page GENERAL: no distress Pulses: dorsalis pedis intact bilat.   Feet: no deformity.  no ulcer on the feet.  feet are of normal color and temp.  no edema Neuro: sensation is intact to touch on the feet        Assessment & Plan:  DM.  Apparently well-controlled HTN,  well-controlled anemia, uncertain etiology

## 2012-02-27 LAB — BASIC METABOLIC PANEL
BUN: 31 mg/dL — ABNORMAL HIGH (ref 6–23)
Chloride: 108 mEq/L (ref 96–112)
Creatinine, Ser: 1.5 mg/dL — ABNORMAL HIGH (ref 0.4–1.2)
GFR: 43.93 mL/min — ABNORMAL LOW (ref >60.00)

## 2012-02-27 LAB — LIPID PANEL
Cholesterol: 218 mg/dL — ABNORMAL HIGH (ref 0–200)
Total CHOL/HDL Ratio: 3
VLDL: 16.4 mg/dL (ref 0.0–40.0)

## 2012-02-27 LAB — HEPATIC FUNCTION PANEL
AST: 13 U/L (ref 0–37)
Albumin: 4.2 g/dL (ref 3.5–5.2)
Alkaline Phosphatase: 113 U/L (ref 39–117)
Total Protein: 6.6 g/dL (ref 6.0–8.3)

## 2012-02-27 LAB — URIC ACID: Uric Acid, Serum: 5.4 mg/dL (ref 2.4–7.0)

## 2012-02-29 ENCOUNTER — Encounter: Payer: Self-pay | Admitting: Endocrinology

## 2012-03-01 ENCOUNTER — Telehealth: Payer: Self-pay | Admitting: *Deleted

## 2012-03-01 NOTE — Telephone Encounter (Signed)
Called pt to inform of lab results, pt informed (letter also mailed to pt). 

## 2012-03-11 ENCOUNTER — Other Ambulatory Visit: Payer: Medicare Other

## 2012-03-12 ENCOUNTER — Telehealth: Payer: Self-pay | Admitting: *Deleted

## 2012-03-12 ENCOUNTER — Encounter: Payer: Self-pay | Admitting: Endocrinology

## 2012-03-12 LAB — HEMOCCULT SLIDES (X 3 CARDS)
Fecal Occult Blood: NEGATIVE
OCCULT 2: NEGATIVE

## 2012-03-12 NOTE — Telephone Encounter (Signed)
Called pt to inform of hemoccult results, pt informed (letter also mailed to pt).

## 2012-03-13 ENCOUNTER — Emergency Department (INDEPENDENT_AMBULATORY_CARE_PROVIDER_SITE_OTHER)
Admission: EM | Admit: 2012-03-13 | Discharge: 2012-03-13 | Disposition: A | Discharge status: Home / Self Care | Payer: Medicare Other | Source: Home / Self Care | Attending: Family Medicine | Admitting: Family Medicine

## 2012-03-13 ENCOUNTER — Encounter (HOSPITAL_COMMUNITY): Payer: Self-pay

## 2012-03-13 DIAGNOSIS — J302 Other seasonal allergic rhinitis: Secondary | ICD-10-CM

## 2012-03-13 DIAGNOSIS — J309 Allergic rhinitis, unspecified: Secondary | ICD-10-CM

## 2012-03-13 MED ORDER — AZELASTINE HCL 0.15 % NA SOLN: 1.0000 | Freq: Two times a day (BID) | NASAL | Status: DC

## 2012-03-13 MED ORDER — FLUTICASONE PROPIONATE 50 MCG/ACT NA SUSP: 1.0000 | Freq: Two times a day (BID) | NASAL | Status: DC

## 2012-03-13 MED ORDER — CETIRIZINE HCL 10 MG PO TABS: 10.0000 mg | ORAL_TABLET | Freq: Every day | ORAL | Status: DC

## 2012-03-13 NOTE — ED Provider Notes (Signed)
History     CSN: [id number]  Arrival date & time 03/13/12  4098   First MD Initiated Contact with Patient 03/13/12 1825      Chief Complaint  Patient presents with  . Allergies    (Consider location/radiation/quality/duration/timing/severity/associated sxs/prior treatment) Patient is a 71 y.o. female presenting with URI. The history is provided by the patient.  URI The primary symptoms include sore throat. Primary symptoms do not include fever, headaches or rash. The current episode started 3 to 5 days ago. This is a new problem. The problem has been gradually worsening.  Symptoms associated with the illness include congestion and rhinorrhea. Risk factors for severe complications from URI include immunosuppression.    Past Medical History  Diagnosis Date  . 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 GI bleed 11/26/2010    January, 2012 , AVM  . Asthma   . Coronary artery disease     cath 11/2007, grafts patent /  Nuclear, June, 2011, prior inferior MI with mild peri-infarct ischemia, anterior breast attenuation, EF 67%, done for renal transplant assessment  . Diabetes mellitus     type 2  . Hyperlipidemia   . Hypertension   . Gout   . Osteoporosis   . ESRD on dialysis     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, 2012  . Aspirin allergy     possible asa allergy - tolerates low-dose aspirin  . Hyperparathyroidism   . Pneumonia 08/2010    Hospitalization, October, 2011  . Hx of CABG     2003  . Ejection fraction     EF 60%, echo, January, 2009 / EF 67%, nuclear, June, 2011  . S/P kidney transplant     September, 2012, [facility]  . H/O steroid therapy     Patient on prednisone after renal transplant.  . Kidney replaced by transplant 08/04/2011    Past Surgical History  Procedure Date  . Coronary artery bypass graft 2003  . Vesicovaginal fistula closure w/ tah 1984  . Stress cardiolite 04/01/2007  . Lower  arterial 08/19/2002  . Nephrectomy transplanted organ  Right kidney    Family History  Problem Relation Age of Onset  . Cancer Neg Hx     History  Substance Use Topics  . Smoking status: Former Smoker    Quit date: 11/21/1983  . Smokeless tobacco: Not on file  . Alcohol Use: No    OB History    Grav Para Term Preterm Abortions TAB SAB Ect Mult Living                  Review of Systems  Constitutional: Negative for fever.  HENT: Positive for congestion, sore throat, rhinorrhea, sneezing and postnasal drip.   Respiratory: Negative.   Skin: Negative for rash.  Neurological: Negative for headaches.    Allergies  Asa arthritis strength-antacid; Atorvastatin; Banana; and Lactulose  Home Medications   Current Outpatient Rx  Name Route Sig Dispense Refill  . AMLODIPINE BESYLATE 5 MG PO TABS Oral Take 10 mg by mouth daily.     . ASPIRIN EC 81 MG PO TBEC Oral Take 81 mg by mouth daily.      . AZELASTINE HCL 0.15 % NA SOLN Nasal Place 1 spray into the nose 2 (two) times daily. 30 mL 1  . CALCITRIOL 0.25 MCG PO CAPS Oral Take 0.25 mcg by mouth daily.    Marland Kitchen  CETIRIZINE HCL 10 MG PO TABS Oral Take 1 tablet (10 mg total) by mouth daily. One tab daily for allergies 30 tablet 1  . FLUCONAZOLE 200 MG PO TABS Oral Take 200 mg by mouth daily.    Marland Kitchen FLUTICASONE PROPIONATE 50 MCG/ACT NA SUSP Nasal Place 1 spray into the nose daily as needed. For nasal congestion    . FLUTICASONE PROPIONATE 50 MCG/ACT NA SUSP Nasal Place 1 spray into the nose 2 (two) times daily. 1 g 2  . GLIPIZIDE 5 MG PO TABS Oral Take 5 mg by mouth daily.     Marland Kitchen LISINOPRIL 2.5 MG PO TABS Oral Take 2.5 mg by mouth daily.    Marland Kitchen MYCOPHENOLATE MOFETIL 250 MG PO CAPS Oral Take 250 mg by mouth 3 (three) times daily. Take three tablets twice daily    . OMEPRAZOLE 20 MG PO CPDR Oral Take 40 mg by mouth daily.     Marland Kitchen PRAVASTATIN SODIUM 40 MG PO TABS Oral Take 40 mg by mouth daily.    Bernadette Hoit SODIUM 8.6-50 MG PO TABS Oral  Take 1-2 tablets by mouth 2 (two) times daily as needed. For constipation     . SULFAMETHOXAZOLE-TMP DS 800-160 MG PO TABS  1 tablet on M W F    . TACROLIMUS 1 MG PO CAPS  Take 7mg  in the am and 8mg  in pm      BP 147/69  Pulse 98  Temp(Src) 98.2 F (36.8 C) (Oral)  Resp 20  SpO2 99%  Physical Exam  Nursing note and vitals reviewed. Constitutional: She is oriented to person, place, and time. She appears well-developed and well-nourished.  HENT:  Head: Normocephalic.  Right Ear: External ear normal.  Left Ear: External ear normal.  Nose: Mucosal edema and rhinorrhea present.  Mouth/Throat: Oropharynx is clear and moist.  Eyes: Conjunctivae are normal. Pupils are equal, round, and reactive to light.  Neck: Normal range of motion. Neck supple.  Cardiovascular: Normal rate.   Pulmonary/Chest: Breath sounds normal.  Lymphadenopathy:    She has no cervical adenopathy.  Neurological: She is alert and oriented to person, place, and time.  Skin: Skin is warm and dry.  Psychiatric: She has a normal mood and affect.    ED Course  Procedures (including critical care time)  Labs Reviewed - No data to display No results found.   1. Seasonal allergies       MDM          Linna Hoff, MD 03/13/12 2003

## 2012-03-13 NOTE — ED Notes (Signed)
Recent renal transplant; has been having problems w her allergies for past few days, runny nose, watery eyes; NAD

## 2012-06-05 ENCOUNTER — Telehealth: Payer: Self-pay | Admitting: *Deleted

## 2012-06-05 DIAGNOSIS — E1129 Type 2 diabetes mellitus with other diabetic kidney complication: Secondary | ICD-10-CM

## 2012-06-05 DIAGNOSIS — E119 Type 2 diabetes mellitus without complications: Secondary | ICD-10-CM

## 2012-06-05 DIAGNOSIS — Z0000 Encounter for general adult medical examination without abnormal findings: Secondary | ICD-10-CM

## 2012-06-05 DIAGNOSIS — I10 Essential (primary) hypertension: Secondary | ICD-10-CM

## 2012-06-05 DIAGNOSIS — D649 Anemia, unspecified: Secondary | ICD-10-CM

## 2012-06-05 DIAGNOSIS — Z79899 Other long term (current) drug therapy: Secondary | ICD-10-CM

## 2012-06-05 DIAGNOSIS — N2581 Secondary hyperparathyroidism of renal origin: Secondary | ICD-10-CM

## 2012-06-05 DIAGNOSIS — E785 Hyperlipidemia, unspecified: Secondary | ICD-10-CM

## 2012-06-05 NOTE — Telephone Encounter (Signed)
Message copied by Deatra James on Wed Jun 05, 2012 10:36 AM ------      Message from: Burnett Harry      Created: Wed Jun 05, 2012  9:37 AM      Regarding: CPE 06/20/2012       THANKS!

## 2012-06-20 ENCOUNTER — Ambulatory Visit (INDEPENDENT_AMBULATORY_CARE_PROVIDER_SITE_OTHER): Payer: Medicare Other | Admitting: Endocrinology

## 2012-06-20 ENCOUNTER — Other Ambulatory Visit (HOSPITAL_COMMUNITY)
Admission: RE | Admit: 2012-06-20 | Discharge: 2012-06-20 | Disposition: A | Discharge status: Home / Self Care | Payer: Medicare Other | Source: Ambulatory Visit | Attending: Endocrinology | Admitting: Endocrinology

## 2012-06-20 ENCOUNTER — Encounter: Payer: Self-pay | Admitting: Endocrinology

## 2012-06-20 VITALS — BP 122/66 | HR 91 | Temp 98.1°F | Wt 159.0 lb

## 2012-06-20 DIAGNOSIS — D649 Anemia, unspecified: Secondary | ICD-10-CM

## 2012-06-20 DIAGNOSIS — N289 Disorder of kidney and ureter, unspecified: Secondary | ICD-10-CM

## 2012-06-20 DIAGNOSIS — E785 Hyperlipidemia, unspecified: Secondary | ICD-10-CM

## 2012-06-20 DIAGNOSIS — Z124 Encounter for screening for malignant neoplasm of cervix: Secondary | ICD-10-CM

## 2012-06-20 DIAGNOSIS — Z0000 Encounter for general adult medical examination without abnormal findings: Secondary | ICD-10-CM

## 2012-06-20 NOTE — Progress Notes (Signed)
Subjective:    Patient ID: [name], female    DOB: [birth year], 71 y.o.   MRN: [id number]  HPI The state of at least three ongoing medical problems is addressed today: Renal insufficiency: he has slight doe Dysliidemia: denies chest pain Anemia: Denies brbpr and hematuria Past Medical History  Diagnosis Date  . 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 GI bleed 11/26/2010    January, 2012 , AVM  . Asthma   . Coronary artery disease     cath 11/2007, grafts patent /  Nuclear, June, 2011, prior inferior MI with mild peri-infarct ischemia, anterior breast attenuation, EF 67%, done for renal transplant assessment  . Diabetes mellitus     type 2  . Hyperlipidemia   . Hypertension   . Gout   . Osteoporosis   . ESRD on dialysis     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, 2012  . Aspirin allergy     possible asa allergy - tolerates low-dose aspirin  . Hyperparathyroidism   . Pneumonia 08/2010    Hospitalization, October, 2011  . Hx of CABG     2003  . Ejection fraction     EF 60%, echo, January, 2009 / EF 67%, nuclear, June, 2011  . S/P kidney transplant     September, 2012, [facility]  . H/O steroid therapy     Patient on prednisone after renal transplant.  . Kidney replaced by transplant 08/04/2011    Past Surgical History  Procedure Date  . Coronary artery bypass graft 2003  . Vesicovaginal fistula closure w/ tah 1984  . Stress cardiolite 04/01/2007  . Lower arterial 08/19/2002  . Nephrectomy transplanted organ  Right kidney    History   Social History  . Marital Status: Divorced    Spouse Name: N/A    Number of Children: N/A  . Years of Education: N/A   Occupational History  . Not on file.   Social History Main Topics  . Smoking status: Former Smoker    Quit date: 11/21/1983  . Smokeless tobacco: Not on file  . Alcohol Use: No  . Drug Use: No  . Sexually Active: Not on file   Other Topics  Concern  . Not on file   Social History Narrative  . No narrative on file    Current Outpatient Prescriptions on File Prior to Visit  Medication Sig Dispense Refill  . amLODipine (NORVASC) 5 MG tablet Take 10 mg by mouth daily.       Marland Kitchen aspirin EC 81 MG tablet Take 81 mg by mouth daily.        . Azelastine HCl 0.15 % SOLN Place 1 spray into the nose 2 (two) times daily.  30 mL  1  . calcitRIOL (ROCALTROL) 0.25 MCG capsule Take 0.25 mcg by mouth daily.      . cetirizine (ZYRTEC) 10 MG tablet Take 1 tablet (10 mg total) by mouth daily. One tab daily for allergies  30 tablet  1  . fluconazole (DIFLUCAN) 200 MG tablet Take 200 mg by mouth daily.      . fluticasone (FLONASE) 50 MCG/ACT nasal spray Place 1 spray into the nose 2 (two) times daily.  1 g  2  . furosemide (LASIX) 40 MG tablet Take 40 mg by mouth daily.       Marland Kitchen glipiZIDE (GLUCOTROL) 5 MG tablet Take 5 mg by  mouth daily.       . mycophenolate (CELLCEPT) 250 MG capsule Take 250 mg by mouth 3 (three) times daily. Take three tablets twice daily      . omeprazole (PRILOSEC) 20 MG capsule Take 40 mg by mouth daily.       . pravastatin (PRAVACHOL) 40 MG tablet Take 40 mg by mouth daily.      Marland Kitchen senna-docusate (SENOKOT-S) 8.6-50 MG per tablet Take 1-2 tablets by mouth 2 (two) times daily as needed. For constipation       . sulfamethoxazole-trimethoprim (BACTRIM DS) 800-160 MG per tablet 1 tablet on M W F      . tacrolimus (PROGRAF) 1 MG capsule Take 7mg  in the am and 8mg  in pm        Allergies  Allergen Reactions  . Asa Arthritis Strength-Antacid (Aspirin Buffered)   . Atorvastatin   . Banana Nausea And Vomiting  . Lactulose   . Lisinopril     cough    Family History  Problem Relation Age of Onset  . Cancer Neg Hx     BP 122/66  Pulse 91  Temp 98.1 F (36.7 C) (Oral)  Wt 159 lb (72.122 kg)  SpO2 96%  Review of Systems Weight gain and fever    Objective:   Physical Exam VITAL SIGNS:  See vs page GENERAL: no  distress LUNGS:  Clear to auscultation HEART:  Regular rate and rhythm without murmurs noted. Normal S1,S2.      Lab Results  Component Value Date   WBC 6.5 06/21/2012   HGB 10.4* 06/21/2012   HCT 32.1* 06/21/2012   PLT 183.0 06/21/2012   GLUCOSE 94 06/21/2012   CHOL 200 06/21/2012   TRIG 104.0 06/21/2012   HDL 63.30 06/21/2012   LDLDIRECT 136.3 02/26/2012   LDLCALC 116* 06/21/2012   ALT 10 06/21/2012   AST 17 06/21/2012   NA 142 06/21/2012   K 4.7 06/21/2012   CL 108 06/21/2012   CREATININE 1.5* 06/21/2012   BUN 38* 06/21/2012   CO2 25 06/21/2012   TSH 1.09 06/21/2012   INR 0.99 11/27/2010   HGBA1C 5.4 06/21/2012       Assessment & Plan:  Renal insuff, stable Anemia, stable Dyslipidemia, needs increased rx    Subjective:   Patient here for Medicare annual wellness visit and management of other chronic and acute problems.     Risk factors: advanced age    Roster of Physicians Providing Medical Care to Patient:  See "snapshot"   Activities of Daily Living: In your present state of health, do you have any difficulty performing the following activities?:  Preparing food and eating?: No  Bathing yourself: No  Getting dressed: No  Using the toilet:No  Moving around from place to place: No  In the past year have you fallen or had a near fall?:No    Home Safety: Has smoke detector and wears seat belts. No firearms.   Diet and Exercise  Current exercise habits:   Dietary issues discussed: healthy diet   Depression Screen  Q1: Over the past two weeks, have you felt down, depressed or hopeless?no  Q2: Over the past two weeks, have you felt little interest or pleasure in doing things? no   The following portions of the patient's history were reviewed and updated as appropriate: allergies, current medications, past family history, past medical history, past social history, past surgical history and problem list.   Review of Systems  Denies hearing loss, and visual  loss Objective:   Vision:  Sees  opthalmologist Hearing: grossly normal Body mass index:  See vs page Msk: pt easily and quickly performs "get-up-and-go" from a sitting position Cognitive Impairment Assessment: cognition, memory and judgment appear normal.  remembers 3/3 at 5 minutes.  excellent recall.  can easily read and write a sentence.  alert and oriented x 3   Assessment:   Medicare wellness utd on preventive parameters    Plan:   During the course of the visit the patient was educated and counseled about appropriate screening and preventive services including:        Fall prevention   Screening mammography  Bone densitometry screening  Diabetes screening  Nutrition counseling   Vaccines / LABS Zostavax / Pnemonccoal Vaccine today.    Patient Instructions (the written plan) was given to the patient.   GENITAL: Normal external female.  Normal bimanual.  No mass Pap sent

## 2012-06-20 NOTE — Patient Instructions (Addendum)
please consider these measures for your health:  minimize alcohol.  do not use tobacco products.  have a colonoscopy at least every 10 years from age 68.  Women should have an annual mammogram from age 25.  keep firearms safely stored.  always use seat belts.  have working smoke alarms in your home.  see an eye doctor and dentist regularly.  never drive under the influence of alcohol or drugs (including prescription drugs).   please let me know what your wishes would be, if artificial life support measures should become necessary.  it is critically important to prevent falling down (keep floor areas well-lit, dry, and free of loose objects.  If you have a cane, walker, or wheelchair, you should use it, even for short trips around the house.  Also, try not to rush). You should have a vaccine against shingles (a painful rash which results from the  chickenpox infection which most people had many years ago).  This vaccine reduces, but does not totally eliminate the risk of shingles.  Because this is a medicare part d benefit, you should get it at a pharmacy.   blood tests are being requested for you today.  You will receive a letter with results.

## 2012-06-21 ENCOUNTER — Other Ambulatory Visit (INDEPENDENT_AMBULATORY_CARE_PROVIDER_SITE_OTHER): Payer: Medicare Other

## 2012-06-21 ENCOUNTER — Encounter: Payer: Self-pay | Admitting: Endocrinology

## 2012-06-21 DIAGNOSIS — E785 Hyperlipidemia, unspecified: Secondary | ICD-10-CM

## 2012-06-21 DIAGNOSIS — D649 Anemia, unspecified: Secondary | ICD-10-CM

## 2012-06-21 DIAGNOSIS — E119 Type 2 diabetes mellitus without complications: Secondary | ICD-10-CM

## 2012-06-21 DIAGNOSIS — N058 Unspecified nephritic syndrome with other morphologic changes: Secondary | ICD-10-CM

## 2012-06-21 DIAGNOSIS — Z0000 Encounter for general adult medical examination without abnormal findings: Secondary | ICD-10-CM

## 2012-06-21 DIAGNOSIS — Z79899 Other long term (current) drug therapy: Secondary | ICD-10-CM

## 2012-06-21 DIAGNOSIS — E1129 Type 2 diabetes mellitus with other diabetic kidney complication: Secondary | ICD-10-CM

## 2012-06-21 LAB — URINALYSIS, ROUTINE W REFLEX MICROSCOPIC
Hgb urine dipstick: NEGATIVE
Ketones, ur: NEGATIVE
Total Protein, Urine: NEGATIVE
Urine Glucose: NEGATIVE

## 2012-06-21 LAB — CBC WITH DIFFERENTIAL/PLATELET
Basophils Relative: 0.3 % (ref 0.0–3.0)
Eosinophils Absolute: 0 10*3/uL (ref 0.0–0.7)
HCT: 32.1 % — ABNORMAL LOW (ref 36.0–46.0)
Hemoglobin: 10.4 g/dL — ABNORMAL LOW (ref 12.0–15.0)
Lymphocytes Relative: 17.7 % (ref 12.0–46.0)
Lymphs Abs: 1.2 10*3/uL (ref 0.7–4.0)
MCHC: 32.5 g/dL (ref 30.0–36.0)
MCV: 80.6 fl (ref 78.0–100.0)
Neutro Abs: 4.7 10*3/uL (ref 1.4–7.7)
RBC: 3.98 Mil/uL (ref 3.87–5.11)
RDW: 15.6 % — ABNORMAL HIGH (ref 11.5–14.6)

## 2012-06-21 LAB — BASIC METABOLIC PANEL
CO2: 25 mEq/L (ref 19–32)
Calcium: 9.8 mg/dL (ref 8.4–10.5)
Chloride: 108 mEq/L (ref 96–112)
Glucose, Bld: 94 mg/dL (ref 70–99)
Potassium: 4.7 mEq/L (ref 3.5–5.1)
Sodium: 142 mEq/L (ref 135–145)

## 2012-06-21 LAB — HEPATIC FUNCTION PANEL
Albumin: 4.3 g/dL (ref 3.5–5.2)
Alkaline Phosphatase: 74 U/L (ref 39–117)
Total Bilirubin: 0.5 mg/dL (ref 0.3–1.2)

## 2012-06-21 LAB — LIPID PANEL: Total CHOL/HDL Ratio: 3

## 2012-06-21 LAB — HEMOGLOBIN A1C: Hgb A1c MFr Bld: 5.4 % (ref 4.6–6.5)

## 2012-06-25 ENCOUNTER — Encounter: Payer: Self-pay | Admitting: Endocrinology

## 2012-06-26 ENCOUNTER — Telehealth: Payer: Self-pay | Admitting: *Deleted

## 2012-06-26 NOTE — Telephone Encounter (Signed)
Called pt to inform of lab results, pt informed (letter also mailed to pt). 

## 2012-07-25 ENCOUNTER — Ambulatory Visit
Admission: RE | Admit: 2012-07-25 | Discharge: 2012-07-25 | Disposition: A | Discharge status: Home / Self Care | Payer: Medicare Other | Source: Ambulatory Visit | Attending: Nephrology | Admitting: Nephrology

## 2012-07-25 ENCOUNTER — Other Ambulatory Visit: Payer: Self-pay | Admitting: Nephrology

## 2012-07-25 DIAGNOSIS — R52 Pain, unspecified: Secondary | ICD-10-CM

## 2012-08-16 ENCOUNTER — Telehealth: Payer: Self-pay | Admitting: Endocrinology

## 2012-08-16 NOTE — Telephone Encounter (Signed)
Forward 3 pages from [facility] to Dr. Romero Belling for review on 08-16-12 ym

## 2012-08-19 ENCOUNTER — Telehealth: Payer: Self-pay | Admitting: Endocrinology

## 2012-08-19 NOTE — Telephone Encounter (Signed)
Forward 2 pages from [facility] to Dr. Romero Belling for review on 08-19-12 ym

## 2012-09-19 ENCOUNTER — Telehealth: Payer: Self-pay | Admitting: Endocrinology

## 2012-09-19 NOTE — Telephone Encounter (Signed)
Forward 3 pages from [facility] to Dr. Romero Belling for review on 09-19-12 ym

## 2012-10-14 ENCOUNTER — Telehealth: Payer: Self-pay | Admitting: Endocrinology

## 2012-10-14 NOTE — Telephone Encounter (Signed)
Forward 2 pages from [facility] to Dr. Romero Belling for review on 10-14-12 ym

## 2012-10-30 ENCOUNTER — Other Ambulatory Visit: Payer: Self-pay | Admitting: Endocrinology

## 2012-10-30 DIAGNOSIS — Z1231 Encounter for screening mammogram for malignant neoplasm of breast: Secondary | ICD-10-CM

## 2012-11-28 ENCOUNTER — Ambulatory Visit: Payer: Medicare Other | Admitting: Cardiology

## 2012-11-28 ENCOUNTER — Encounter: Payer: Self-pay | Admitting: Cardiology

## 2012-11-28 ENCOUNTER — Ambulatory Visit (INDEPENDENT_AMBULATORY_CARE_PROVIDER_SITE_OTHER): Payer: Medicare Other | Admitting: Cardiology

## 2012-11-28 VITALS — BP 116/64 | HR 95 | Resp 16 | Ht 63.0 in | Wt 156.8 lb

## 2012-11-28 DIAGNOSIS — R9430 Abnormal result of cardiovascular function study, unspecified: Secondary | ICD-10-CM

## 2012-11-28 DIAGNOSIS — Z01810 Encounter for preprocedural cardiovascular examination: Secondary | ICD-10-CM

## 2012-11-28 DIAGNOSIS — R0989 Other specified symptoms and signs involving the circulatory and respiratory systems: Secondary | ICD-10-CM

## 2012-11-28 DIAGNOSIS — I2510 Atherosclerotic heart disease of native coronary artery without angina pectoris: Secondary | ICD-10-CM

## 2012-11-28 NOTE — Progress Notes (Signed)
HPI  Patient is seen for cardiology followup and for cardiac clearance for hip surgery. She is nicely doing very well. She is scheduled to have her hip replaced on December 18, 2012. I saw her last April, 2013. She's been stable. She has known coronary disease. She underwent CABG in 2003. She had a nuclear study in 2011 that showed prior inferior MI with mild peri-infarct ischemia. Her ejection fraction is good. She was cleared at that time to have her renal transplant in 2012. She has not had any significant cardiac symptoms since then.  Allergies  Allergen Reactions  . Asa Arthritis Strength-Antacid (Aspirin Buffered)   . Atorvastatin   . Banana Nausea And Vomiting  . Lactulose   . Lisinopril     cough    Current Outpatient Prescriptions  Medication Sig Dispense Refill  . amLODipine (NORVASC) 5 MG tablet Take 10 mg by mouth daily.       . Azelastine HCl 0.15 % SOLN Place 1 spray into the nose 2 (two) times daily.  30 mL  1  . Cholecalciferol (VITAMIN D-3) 5000 UNITS TABS Take 1 tablet by mouth daily.      . fluticasone (FLONASE) 50 MCG/ACT nasal spray Place 1 spray into the nose 2 (two) times daily.  1 g  2  . glipiZIDE (GLUCOTROL) 5 MG tablet Take 5 mg by mouth daily.       Marland Kitchen HYDROcodone-acetaminophen (NORCO/VICODIN) 5-325 MG per tablet Take 1 tablet by mouth every 8 (eight) hours as needed.      . mycophenolate (CELLCEPT) 250 MG capsule Take 250 mg by mouth 3 (three) times daily. Take three tablets twice daily      . omeprazole (PRILOSEC) 20 MG capsule Take 40 mg by mouth daily.       . pravastatin (PRAVACHOL) 40 MG tablet Take 40 mg by mouth daily.      . predniSONE (DELTASONE) 5 MG tablet Take 5 mg by mouth daily.      Marland Kitchen aspirin EC 81 MG tablet Take 81 mg by mouth daily.        . calcitRIOL (ROCALTROL) 0.25 MCG capsule Take 0.25 mcg by mouth daily.      . cetirizine (ZYRTEC) 10 MG tablet Take 1 tablet (10 mg total) by mouth daily. One tab daily for allergies  30 tablet  1  .  fluconazole (DIFLUCAN) 200 MG tablet Take 200 mg by mouth daily.      . furosemide (LASIX) 40 MG tablet Take 40 mg by mouth daily.       Marland Kitchen senna-docusate (SENOKOT-S) 8.6-50 MG per tablet Take 1-2 tablets by mouth 2 (two) times daily as needed. For constipation       . sulfamethoxazole-trimethoprim (BACTRIM DS) 800-160 MG per tablet 1 tablet on M W F      . tacrolimus (PROGRAF) 1 MG capsule Take 7mg  in the am and 8mg  in pm        History   Social History  . Marital Status: Divorced    Spouse Name: N/A    Number of Children: N/A  . Years of Education: N/A   Occupational History  . Not on file.   Social History Main Topics  . Smoking status: Former Smoker    Quit date: 11/21/1983  . Smokeless tobacco: Not on file  . Alcohol Use: No  . Drug Use: No  . Sexually Active: Not on file   Other Topics Concern  . Not on file  Social History Narrative  . No narrative on file    Family History  Problem Relation Age of Onset  . Cancer Neg Hx     Past Medical History  Diagnosis Date  . 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 GI bleed 11/26/2010    January, 2012 , AVM  . Asthma   . Coronary artery disease     cath 11/2007, grafts patent /  Nuclear, June, 2011, prior inferior MI with mild peri-infarct ischemia, anterior breast attenuation, EF 67%, done for renal transplant assessment  . Diabetes mellitus     type 2  . Hyperlipidemia   . Hypertension   . Gout   . Osteoporosis   . ESRD on dialysis     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, 2012  . Aspirin allergy     possible asa allergy - tolerates low-dose aspirin  . Hyperparathyroidism   . Pneumonia 08/2010    Hospitalization, October, 2011  . Hx of CABG     2003  . Ejection fraction     EF 60%, echo, January, 2009 / EF 67%, nuclear, June, 2011  . S/P kidney transplant     September, 2012, [facility]  . H/O steroid therapy     Patient on prednisone after  renal transplant.  . Kidney replaced by transplant 08/04/2011  . Preop cardiovascular exam     Surgical clearance for hip surgery December 18, 2012    Past Surgical History  Procedure Date  . Coronary artery bypass graft 2003  . Vesicovaginal fistula closure w/ tah 1984  . Stress cardiolite 04/01/2007  . Lower arterial 08/19/2002  . Nephrectomy transplanted organ  Right kidney    Patient Active Problem List  Diagnosis  . GOUT  . THROMBOCYTOPENIA  . ASTHMA  . SECONDARY HYPERPARATHYROIDISM  . MENOPAUSAL SYNDROME  . FOOT PAIN  . OSTEOPOROSIS  . VERTIGO  . Anemia  . Coronary artery disease  . Hyperlipidemia  . Hypertension  . Aspirin allergy  . Pneumonia  . Hx of CABG  . Ejection fraction  . Anemia  . ESRD on dialysis  . GI bleed  . S/P kidney transplant  . H/O steroid therapy  . Encounter for long-term (current) use of other medications  . Type II or unspecified type diabetes mellitus with renal manifestations, not stated as uncontrolled  . Type II or unspecified type diabetes mellitus without mention of complication, not stated as uncontrolled  . Preop cardiovascular exam    ROS   Patient denies fever, chills, headache, sweats, rash, change in vision, change in hearing, chest pain, cough, nausea vomiting, urinary symptoms. All other systems are reviewed and are negative.  PHYSICAL EXAM  Patient is oriented to person time and place. Affect is normal. There is no jugular venous distention. Lungs are clear. Respiratory effort is nonlabored. Cardiac exam reveals S1 and S2. There are no clicks or significant murmurs. The abdomen is soft. There is no peripheral edema. There no musculoskeletal deformities. There are no skin rashes.  Filed Vitals:   11/28/12 1634  BP: 116/64  Pulse: 95  Resp: 16  Height: 5\' 3"  (1.6 m)  Weight: 156 lb 12.8 oz (71.124 kg)     ASSESSMENT & PLAN

## 2012-11-28 NOTE — Patient Instructions (Addendum)
You have been cleared for your surgery by Dr Myrtis Ser from a cardiac standpoint.  Your physician wants you to follow-up in: 6 months.   You will receive a reminder letter in the mail two months in advance. If you don't receive a letter, please call our office to schedule the follow-up appointment.

## 2012-11-28 NOTE — Assessment & Plan Note (Signed)
The patient's overall cardiac status is stable. She's not having any chest pain. She has no signs of heart failure. She has normal LV function. She is cleared for hip surgery on December 18, 2012. Our team will be available if needed while she is in the hospital.

## 2012-11-28 NOTE — Assessment & Plan Note (Signed)
Coronary disease is stable. Nuclear scan in 2011 revealed no marked ischemia. She has normal LV function. No further workup is needed.

## 2012-11-28 NOTE — Assessment & Plan Note (Signed)
Historically the patient has a normal ejection fraction. No further workup.

## 2012-12-05 ENCOUNTER — Encounter (HOSPITAL_COMMUNITY): Payer: Self-pay | Admitting: Pharmacy Technician

## 2012-12-06 ENCOUNTER — Ambulatory Visit (INDEPENDENT_AMBULATORY_CARE_PROVIDER_SITE_OTHER): Payer: Medicare Other | Admitting: Endocrinology

## 2012-12-06 ENCOUNTER — Ambulatory Visit: Payer: Medicare Other | Admitting: Endocrinology

## 2012-12-06 ENCOUNTER — Encounter: Payer: Self-pay | Admitting: Endocrinology

## 2012-12-06 VITALS — BP 142/68 | HR 94 | Temp 98.0°F | Wt 159.0 lb

## 2012-12-06 DIAGNOSIS — E119 Type 2 diabetes mellitus without complications: Secondary | ICD-10-CM

## 2012-12-06 LAB — HEMOGLOBIN A1C: Hgb A1c MFr Bld: 5.7 % (ref 4.6–6.5)

## 2012-12-06 NOTE — Progress Notes (Signed)
Subjective:    Patient ID: [name], female    DOB: 21-Mar-1941, 72 y.o.   MRN: [id number]  HPI Left THR will be done 12/18/12, by dr Eulah Pont.   Pt returns for f/u of type 2 DM (dx'ed 2005; complicated by renal failure and CAD).no cbg record, but states cbg's are well-controlled.  denies hypoglycemia.  Denies weight change.  Denies SOB Past Medical History  Diagnosis Date  . 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 GI bleed 11/26/2010    January, 2012 , AVM  . Asthma   . Coronary artery disease     cath 11/2007, grafts patent /  Nuclear, June, 2011, prior inferior MI with mild peri-infarct ischemia, anterior breast attenuation, EF 67%, done for renal transplant assessment  . Diabetes mellitus     type 2  . Hyperlipidemia   . Hypertension   . Gout   . Osteoporosis   . ESRD on dialysis     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, 2012  . Aspirin allergy     possible asa allergy - tolerates low-dose aspirin  . Hyperparathyroidism   . Pneumonia 08/2010    Hospitalization, October, 2011  . Hx of CABG     2003  . Ejection fraction     EF 60%, echo, January, 2009 / EF 67%, nuclear, June, 2011  . S/P kidney transplant     September, 2012, [facility]  . H/O steroid therapy     Patient on prednisone after renal transplant.  . Kidney replaced by transplant 08/04/2011  . Preop cardiovascular exam     Surgical clearance for hip surgery December 18, 2012    Past Surgical History  Procedure Date  . Coronary artery bypass graft 2003  . Vesicovaginal fistula closure w/ tah 1984  . Stress cardiolite 04/01/2007  . Lower arterial 08/19/2002  . Nephrectomy transplanted organ  Right kidney    History   Social History  . Marital Status: Divorced    Spouse Name: N/A    Number of Children: N/A  . Years of Education: N/A   Occupational History  . Not on file.   Social History Main Topics  . Smoking status: Former Smoker      Quit date: 11/21/1983  . Smokeless tobacco: Not on file  . Alcohol Use: No  . Drug Use: No  . Sexually Active: Not on file   Other Topics Concern  . Not on file   Social History Narrative  . No narrative on file    Current Outpatient Prescriptions on File Prior to Visit  Medication Sig Dispense Refill  . amLODipine (NORVASC) 10 MG tablet Take 10 mg by mouth daily.      Marland Kitchen aspirin EC 81 MG tablet Take 81 mg by mouth daily.        . Azelastine HCl 0.15 % SOLN Place 1 spray into the nose 2 (two) times daily.      . calcitRIOL (ROCALTROL) 0.25 MCG capsule Take 0.25 mcg by mouth daily.      . cetirizine (ZYRTEC) 10 MG tablet Take 10 mg by mouth daily as needed. For allergies      . Cholecalciferol (VITAMIN D-3) 5000 UNITS TABS Take 1 tablet by mouth daily.      . fluconazole (DIFLUCAN) 200 MG tablet Take 200 mg by mouth daily.      . fluticasone (FLONASE) 50 MCG/ACT  nasal spray Place 1 spray into the nose 2 (two) times daily.      Marland Kitchen glipiZIDE (GLUCOTROL) 5 MG tablet 1/2 tab daily      . HYDROcodone-acetaminophen (NORCO/VICODIN) 5-325 MG per tablet Take 1 tablet by mouth every 8 (eight) hours as needed. For pain      . mycophenolate (CELLCEPT) 250 MG capsule Take 750 mg by mouth 2 (two) times daily.       Marland Kitchen omeprazole (PRILOSEC) 20 MG capsule Take 40 mg by mouth daily.       . pravastatin (PRAVACHOL) 40 MG tablet Take 40 mg by mouth daily.      . predniSONE (DELTASONE) 5 MG tablet Take 5 mg by mouth daily.      Marland Kitchen senna-docusate (SENOKOT-S) 8.6-50 MG per tablet Take 1-2 tablets by mouth 2 (two) times daily as needed. For constipation       . tacrolimus (PROGRAF) 1 MG capsule Take 7-8 mg by mouth 2 (two) times daily. Take 7mg  in the am and 8mg  in pm        Allergies  Allergen Reactions  . Asa Arthritis Strength-Antacid (Aspirin Buffered)   . Atorvastatin Other (See Comments)    Patient's skin was skin was sensitive  . Banana Nausea And Vomiting  . Lactose Intolerance (Gi) Other (See  Comments)    Reaction unknown  . Lisinopril Cough    Family History  Problem Relation Age of Onset  . Cancer Neg Hx     BP 142/68  Pulse 94  Temp 98 F (36.7 C) (Oral)  Wt 159 lb (72.122 kg)  SpO2 97%    Review of Systems Denies chest pain and LOC    Objective:   Physical Exam VITAL SIGNS:  See vs page GENERAL: no distress Pulses: dorsalis pedis intact bilat.   Feet: no deformity.  no ulcer on the feet.  feet are of normal color and temp.  no edema.  There is bilateral onychomycosis Neuro: sensation is intact to touch on the feet.     Lab Results  Component Value Date   HGBA1C 5.7 12/06/2012      Assessment & Plan:  She has been cleared by cardiology already DM, overcontrolled

## 2012-12-08 NOTE — Patient Instructions (Addendum)
blood tests are being requested for you today.  We'll contact you with results. her surgical risk is low and outweighed by the potential benefit of the surgery.  she is therefore medically cleared

## 2012-12-09 ENCOUNTER — Ambulatory Visit (HOSPITAL_COMMUNITY): Payer: Medicare Other | Attending: Endocrinology

## 2012-12-10 ENCOUNTER — Encounter (HOSPITAL_COMMUNITY)
Admission: RE | Admit: 2012-12-10 | Discharge: 2012-12-10 | Disposition: A | Discharge status: Home / Self Care | Payer: Medicare Other | Source: Ambulatory Visit | Attending: Orthopedic Surgery | Admitting: Orthopedic Surgery

## 2012-12-10 ENCOUNTER — Encounter (HOSPITAL_COMMUNITY): Payer: Self-pay

## 2012-12-10 ENCOUNTER — Encounter (HOSPITAL_COMMUNITY)
Admission: RE | Admit: 2012-12-10 | Discharge: 2012-12-10 | Disposition: A | Discharge status: Home / Self Care | Payer: Medicare Other | Source: Ambulatory Visit | Attending: Surgery | Admitting: Surgery

## 2012-12-10 HISTORY — DX: Gastro-esophageal reflux disease without esophagitis: K21.9

## 2012-12-10 HISTORY — DX: Acute myocardial infarction, unspecified: I21.9

## 2012-12-10 LAB — CBC
MCH: 25.2 pg — ABNORMAL LOW (ref 26.0–34.0)
MCV: 73.3 fL — ABNORMAL LOW (ref 78.0–100.0)
Platelets: 188 10*3/uL (ref 150–400)
RBC: 4.69 MIL/uL (ref 3.87–5.11)
RDW: 16.9 % — ABNORMAL HIGH (ref 11.5–15.5)
WBC: 10.3 10*3/uL (ref 4.0–10.5)

## 2012-12-10 LAB — URINALYSIS, ROUTINE W REFLEX MICROSCOPIC
Bilirubin Urine: NEGATIVE
Ketones, ur: NEGATIVE mg/dL
Nitrite: NEGATIVE
Protein, ur: NEGATIVE mg/dL
pH: 5 (ref 5.0–8.0)

## 2012-12-10 LAB — COMPREHENSIVE METABOLIC PANEL
ALT: 6 U/L (ref 0–35)
AST: 15 U/L (ref 0–37)
Albumin: 4.3 g/dL (ref 3.5–5.2)
Calcium: 10.1 mg/dL (ref 8.4–10.5)
Creatinine, Ser: 1.08 mg/dL (ref 0.50–1.10)
Sodium: 139 mEq/L (ref 135–145)

## 2012-12-10 LAB — PROTIME-INR: INR: 0.96 (ref 0.00–1.49)

## 2012-12-10 NOTE — Progress Notes (Signed)
CALLED PATIENT RE: ASPIRIN, SHE STATED SHE HAS ALREADY STOPPED ASPIRIN AS INSTRUCTED BY OFFICE.

## 2012-12-10 NOTE — Pre-Procedure Instructions (Signed)
[name]  12/10/2012   Your procedure is scheduled on:  Wednesday  12/18/12  Report to [facility] at 730 AM.  Call this number if you have problems the morning of surgery: [phone]   Remember:   Do not eat food or drink liquids after midnight.   Take these medicines the morning of surgery with A SIP OF WATER: AMLODIPINE(NORVASC), HYDROCODONE IF NEEDED, CELLCEPT, PRILOSEC, PREDNISONE, PROGRAF    Do not wear jewelry, make-up or nail polish.  Do not wear lotions, powders, or perfumes. You may wear deodorant.  Do not shave 48 hours prior to surgery. Men may shave face and neck.  Do not bring valuables to the hospital.  Contacts, dentures or bridgework may not be worn into surgery.  Leave suitcase in the car. After surgery it may be brought to your room.  For patients admitted to the hospital, checkout time is 11:00 AM the day of  discharge.   Patients discharged the day of surgery will not be allowed to drive  home.  Name and phone number of your driver:   Special Instructions: Shower using CHG 2 nights before surgery and the night before surgery.  If you shower the day of surgery use CHG.  Use special wash - you have one bottle of CHG for all showers.  You should use approximately 1/3 of the bottle for each shower.   Please read over the following fact sheets that you were given: Pain Booklet, Coughing and Deep Breathing, Blood Transfusion Information, Total Joint Packet, MRSA Information and Surgical Site Infection Prevention

## 2012-12-12 ENCOUNTER — Ambulatory Visit: Payer: Medicare Other | Admitting: Cardiology

## 2012-12-17 MED ORDER — CEFAZOLIN SODIUM-DEXTROSE 2-3 GM-% IV SOLR
2.0000 g | INTRAVENOUS | Status: AC
  Administered 2012-12-18: 2 g via INTRAVENOUS
  Filled 2012-12-17: qty 50

## 2012-12-17 NOTE — Progress Notes (Signed)
Procedure changed per OR.  Attempted to notify pt. Message left on machine for pt to arrive @7AM .

## 2012-12-17 NOTE — H&P (Signed)
  [facility] [address]   [address] [place], [place] [address] [phone] A Division of [facility]  Loreta Ave, M.D.   Robert A. Thurston Hole, M.D.   Burnell Blanks, M.D.   Eulas Post, M.D.   Lunette Stands, M.D Buford Dresser, M.D.  Charlsie Quest, M.D.   Estell Harpin, M.D.   Melina Fiddler, M.D. Genene Churn. Barry Dienes, PA-C            Kirstin A. Shepperson, PA-C Josh Pelion, PA-C [place], [place]   RE: [name], [name]                                [id number]      DOB: 1941/04/30 PROGRESS NOTE: 12-10-12 Chief complaint: Left hip pain.  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  Returns.  States that hip symptoms are unchanged from previous visit.  She is wanting to proceed with left total hip replacement as scheduled.  We received pre-op cardiac clearance.  Patient states that she also had a right kidney transplant and Dr. Darrick Penna is following her for this.  I have not seen clearance regarding kidney issues.  She has an appointment with Dr. Everardo All, primary care physician, today for clearance.  She will be needing to go to [facility] post-op for rehab.   Current medications: Norvasc, Vitamin D, Flonase, Glucotrol, Norco, CellCept, Prilosec, Pravachol, Deltasone, Aspirin, Calcitriol, Zyrtec, Diflucan, Lasix, Senokot, Bactrim DS and Prograf. Allergies: Aspirin, Atorvastatin, bananas, Lactulose and Lisinopril. Past medical/surgical history: Anemia, renal transplant, GI bleed, asthma, coronary artery disease, Type II diabetes, hyperlipidemia, hypertension, gout, osteoporosis, end stage renal disease on dialysis, hyperparathyroidism, pneumonia, hospitalization in October of 2011, CABG in 2003, history of steroid therapy and vesicovaginal fistula closure. Review of systems: Patient currently denies lightheadedness, dizziness, fevers or chills,  chest pain, shortness of breath, GI or neuro issues. Family history: Cancer. Social history: She quit smoking in 1985.  Denies alcohol use.  Patient is divorced and lives alone.        EXAMINATION: Height: 5?2.  Weight: 159 pounds.  Blood pressure: 130/75.  Pulse: 86.  Temperature: 98.2.  Pleasant black female, alert and oriented x 3 and in no acute distress.  Gait is antalgic.  Head is normocephalic, a traumatic.  PERRLA, EOMI.  Cervical spine unremarkable.  Lungs: CTA bilaterally.  No wheezes.  Heart: RRR.  Abdomen: Round and non-distended.  NBS x 4.  Soft and non-tender.  Left hip: Decreased range of motion with pain.  Neurovascularly intact.  Skin warm and dry.  No increase in respiratory effort.    X-RAYS: Previous MRI scan of the left hip shows AVN.    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  PLAN: We will proceed with left total hip replacement as scheduled.  Surgical procedure, along with potential rehab/recovery time discussed.  All questions answered.  We will await pre-op clearance from Dr. Everardo All and Dr. Darrick Penna.  Will need placement at [facility] post-op for rehab.  All questions answered.     Genene Churn. Barry Dienes, PA-C Electronically verified by Loreta Ave, M.D. JMO:jjh D 12-10-12 T 12-12-12

## 2012-12-18 ENCOUNTER — Encounter (HOSPITAL_COMMUNITY)
Admission: RE | Disposition: A | Discharge status: Skilled Nursing Facility | Payer: Self-pay | Source: Ambulatory Visit | Attending: Orthopedic Surgery

## 2012-12-18 ENCOUNTER — Encounter (HOSPITAL_COMMUNITY): Payer: Self-pay | Admitting: *Deleted

## 2012-12-18 ENCOUNTER — Inpatient Hospital Stay (HOSPITAL_COMMUNITY)
Admission: RE | Admit: 2012-12-18 | Discharge: 2012-12-21 | DRG: 470 | Disposition: A | Discharge status: Skilled Nursing Facility | Payer: Medicare Other | Source: Ambulatory Visit | Attending: Orthopedic Surgery | Admitting: Orthopedic Surgery

## 2012-12-18 ENCOUNTER — Inpatient Hospital Stay (HOSPITAL_COMMUNITY): Payer: Medicare Other

## 2012-12-18 ENCOUNTER — Inpatient Hospital Stay (HOSPITAL_COMMUNITY): Payer: Medicare Other | Admitting: *Deleted

## 2012-12-18 ENCOUNTER — Encounter (HOSPITAL_COMMUNITY): Payer: Self-pay

## 2012-12-18 DIAGNOSIS — Z01812 Encounter for preprocedural laboratory examination: Secondary | ICD-10-CM

## 2012-12-18 DIAGNOSIS — Z87891 Personal history of nicotine dependence: Secondary | ICD-10-CM

## 2012-12-18 DIAGNOSIS — Z886 Allergy status to analgesic agent status: Secondary | ICD-10-CM

## 2012-12-18 DIAGNOSIS — I10 Essential (primary) hypertension: Secondary | ICD-10-CM | POA: Diagnosis present

## 2012-12-18 DIAGNOSIS — Z7901 Long term (current) use of anticoagulants: Secondary | ICD-10-CM

## 2012-12-18 DIAGNOSIS — Z951 Presence of aortocoronary bypass graft: Secondary | ICD-10-CM

## 2012-12-18 DIAGNOSIS — J45909 Unspecified asthma, uncomplicated: Secondary | ICD-10-CM | POA: Diagnosis present

## 2012-12-18 DIAGNOSIS — M1610 Unilateral primary osteoarthritis, unspecified hip: Principal | ICD-10-CM | POA: Diagnosis present

## 2012-12-18 DIAGNOSIS — M810 Age-related osteoporosis without current pathological fracture: Secondary | ICD-10-CM | POA: Diagnosis present

## 2012-12-18 DIAGNOSIS — I2510 Atherosclerotic heart disease of native coronary artery without angina pectoris: Secondary | ICD-10-CM | POA: Diagnosis present

## 2012-12-18 DIAGNOSIS — D62 Acute posthemorrhagic anemia: Secondary | ICD-10-CM | POA: Diagnosis not present

## 2012-12-18 DIAGNOSIS — M109 Gout, unspecified: Secondary | ICD-10-CM | POA: Diagnosis present

## 2012-12-18 DIAGNOSIS — E119 Type 2 diabetes mellitus without complications: Secondary | ICD-10-CM | POA: Diagnosis present

## 2012-12-18 DIAGNOSIS — M169 Osteoarthritis of hip, unspecified: Principal | ICD-10-CM | POA: Diagnosis present

## 2012-12-18 DIAGNOSIS — E785 Hyperlipidemia, unspecified: Secondary | ICD-10-CM | POA: Diagnosis present

## 2012-12-18 DIAGNOSIS — Z96649 Presence of unspecified artificial hip joint: Secondary | ICD-10-CM

## 2012-12-18 DIAGNOSIS — Z79899 Other long term (current) drug therapy: Secondary | ICD-10-CM

## 2012-12-18 DIAGNOSIS — IMO0002 Reserved for concepts with insufficient information to code with codable children: Secondary | ICD-10-CM

## 2012-12-18 DIAGNOSIS — Z940 Kidney transplant status: Secondary | ICD-10-CM

## 2012-12-18 DIAGNOSIS — Z888 Allergy status to other drugs, medicaments and biological substances status: Secondary | ICD-10-CM

## 2012-12-18 HISTORY — PX: TOTAL HIP ARTHROPLASTY: SHX124

## 2012-12-18 LAB — GLUCOSE, CAPILLARY
Glucose-Capillary: 105 mg/dL — ABNORMAL HIGH (ref 70–99)
Glucose-Capillary: 169 mg/dL — ABNORMAL HIGH (ref 70–99)
Glucose-Capillary: 184 mg/dL — ABNORMAL HIGH (ref 70–99)

## 2012-12-18 LAB — CBC
Hemoglobin: 10.1 g/dL — ABNORMAL LOW (ref 12.0–15.0)
RBC: 4.02 MIL/uL (ref 3.87–5.11)

## 2012-12-18 LAB — CREATININE, SERUM
Creatinine, Ser: 1.06 mg/dL (ref 0.50–1.10)
GFR calc Af Amer: 61 mL/min — ABNORMAL LOW (ref >90)
GFR calc non Af Amer: 52 mL/min — ABNORMAL LOW (ref >90)

## 2012-12-18 SURGERY — ARTHROPLASTY, HIP, TOTAL,POSTERIOR APPROACH
Anesthesia: General | Site: Hip | Laterality: Left | Wound class: Clean

## 2012-12-18 MED ORDER — CEFAZOLIN SODIUM 1-5 GM-% IV SOLN
1.0000 g | Freq: Three times a day (TID) | INTRAVENOUS | Status: AC
  Administered 2012-12-18 – 2012-12-19 (×2): 1 g via INTRAVENOUS
  Filled 2012-12-18 (×2): qty 50

## 2012-12-18 MED ORDER — METOCLOPRAMIDE HCL 10 MG PO TABS: 5.0000 mg | ORAL_TABLET | Freq: Three times a day (TID) | ORAL | Status: DC | PRN

## 2012-12-18 MED ORDER — HYDROMORPHONE HCL PF 1 MG/ML IJ SOLN
INTRAMUSCULAR | Status: AC
  Administered 2012-12-18: 0.5 mg via INTRAVENOUS
  Filled 2012-12-18: qty 1

## 2012-12-18 MED ORDER — ACETAMINOPHEN 650 MG RE SUPP: 650.0000 mg | Freq: Four times a day (QID) | RECTAL | Status: DC | PRN

## 2012-12-18 MED ORDER — MYCOPHENOLATE MOFETIL 250 MG PO CAPS
750.0000 mg | ORAL_CAPSULE | Freq: Two times a day (BID) | ORAL | Status: DC
  Administered 2012-12-18 – 2012-12-21 (×6): 750 mg via ORAL
  Filled 2012-12-18 (×7): qty 3

## 2012-12-18 MED ORDER — LACTATED RINGERS IV SOLN
INTRAVENOUS | Status: DC | PRN
  Administered 2012-12-18 (×2): via INTRAVENOUS

## 2012-12-18 MED ORDER — BUPIVACAINE HCL 0.5 % IJ SOLN
INTRAMUSCULAR | Status: DC | PRN
  Administered 2012-12-18: 10 mL

## 2012-12-18 MED ORDER — NEOSTIGMINE METHYLSULFATE 1 MG/ML IJ SOLN
INTRAMUSCULAR | Status: DC | PRN
  Administered 2012-12-18: 3 mg via INTRAVENOUS
  Administered 2012-12-18: 1 mg via INTRAVENOUS

## 2012-12-18 MED ORDER — GLYCOPYRROLATE 0.2 MG/ML IJ SOLN
INTRAMUSCULAR | Status: DC | PRN
  Administered 2012-12-18: 0.2 mg via INTRAVENOUS
  Administered 2012-12-18: 0.4 mg via INTRAVENOUS

## 2012-12-18 MED ORDER — FENTANYL CITRATE 0.05 MG/ML IJ SOLN
INTRAMUSCULAR | Status: DC | PRN
  Administered 2012-12-18 (×3): 50 ug via INTRAVENOUS
  Administered 2012-12-18 (×3): 100 ug via INTRAVENOUS
  Administered 2012-12-18: 50 ug via INTRAVENOUS

## 2012-12-18 MED ORDER — ROCURONIUM BROMIDE 100 MG/10ML IV SOLN
INTRAVENOUS | Status: DC | PRN
  Administered 2012-12-18: 50 mg via INTRAVENOUS

## 2012-12-18 MED ORDER — OXYCODONE HCL 5 MG/5ML PO SOLN: 5.0000 mg | Freq: Once | ORAL | Status: DC | PRN

## 2012-12-18 MED ORDER — BUPIVACAINE LIPOSOME 1.3 % IJ SUSP
20.0000 mL | INTRAMUSCULAR | Status: DC
  Filled 2012-12-18: qty 20

## 2012-12-18 MED ORDER — DOCUSATE SODIUM 100 MG PO CAPS
100.0000 mg | ORAL_CAPSULE | Freq: Two times a day (BID) | ORAL | Status: DC
  Administered 2012-12-18 – 2012-12-21 (×6): 100 mg via ORAL
  Filled 2012-12-18 (×7): qty 1

## 2012-12-18 MED ORDER — METOCLOPRAMIDE HCL 5 MG/ML IJ SOLN: 5.0000 mg | Freq: Three times a day (TID) | INTRAMUSCULAR | Status: DC | PRN

## 2012-12-18 MED ORDER — WARFARIN - PHARMACIST DOSING INPATIENT
Freq: Every day | Status: DC
  Administered 2012-12-20: 18:00:00

## 2012-12-18 MED ORDER — GLIPIZIDE 5 MG PO TABS
5.0000 mg | ORAL_TABLET | Freq: Every day | ORAL | Status: DC
  Administered 2012-12-19 – 2012-12-21 (×3): 5 mg via ORAL
  Filled 2012-12-18 (×5): qty 1

## 2012-12-18 MED ORDER — METHOCARBAMOL 100 MG/ML IJ SOLN
500.0000 mg | Freq: Four times a day (QID) | INTRAVENOUS | Status: DC | PRN
  Administered 2012-12-18: 500 mg via INTRAVENOUS
  Filled 2012-12-18 (×2): qty 5

## 2012-12-18 MED ORDER — AZELASTINE HCL 0.1 % NA SOLN
1.0000 | Freq: Two times a day (BID) | NASAL | Status: DC
  Administered 2012-12-18 – 2012-12-21 (×6): 1 via NASAL
  Filled 2012-12-18 (×2): qty 30

## 2012-12-18 MED ORDER — WARFARIN VIDEO: Freq: Once | Status: DC

## 2012-12-18 MED ORDER — HYDROCORTISONE SOD SUCCINATE 100 MG IJ SOLR
INTRAMUSCULAR | Status: DC | PRN
  Administered 2012-12-18: 100 mg via INTRAVENOUS

## 2012-12-18 MED ORDER — LORATADINE 10 MG PO TABS
10.0000 mg | ORAL_TABLET | Freq: Every day | ORAL | Status: DC
  Administered 2012-12-19 – 2012-12-21 (×3): 10 mg via ORAL
  Filled 2012-12-18 (×3): qty 1

## 2012-12-18 MED ORDER — COUMADIN BOOK
Freq: Once | Status: DC
  Filled 2012-12-18: qty 1

## 2012-12-18 MED ORDER — HYDROCODONE-ACETAMINOPHEN 10-325 MG PO TABS
1.0000 | ORAL_TABLET | ORAL | Status: DC | PRN
  Administered 2012-12-19 – 2012-12-20 (×4): 2 via ORAL
  Administered 2012-12-21: 1 via ORAL
  Administered 2012-12-21: 2 via ORAL
  Administered 2012-12-21: 1 via ORAL
  Filled 2012-12-18: qty 2
  Filled 2012-12-18: qty 1
  Filled 2012-12-18 (×2): qty 2
  Filled 2012-12-18: qty 1
  Filled 2012-12-18 (×2): qty 2

## 2012-12-18 MED ORDER — SENNOSIDES-DOCUSATE SODIUM 8.6-50 MG PO TABS: 1.0000 | ORAL_TABLET | Freq: Every evening | ORAL | Status: DC | PRN

## 2012-12-18 MED ORDER — INSULIN ASPART 100 UNIT/ML ~~LOC~~ SOLN
0.0000 [IU] | Freq: Three times a day (TID) | SUBCUTANEOUS | Status: DC
  Administered 2012-12-18 – 2012-12-19 (×2): 3 [IU] via SUBCUTANEOUS
  Administered 2012-12-19: 2 [IU] via SUBCUTANEOUS

## 2012-12-18 MED ORDER — METHOCARBAMOL 500 MG PO TABS
500.0000 mg | ORAL_TABLET | Freq: Four times a day (QID) | ORAL | Status: DC | PRN
  Administered 2012-12-18 – 2012-12-21 (×6): 500 mg via ORAL
  Filled 2012-12-18 (×6): qty 1

## 2012-12-18 MED ORDER — ONDANSETRON HCL 4 MG/2ML IJ SOLN
INTRAMUSCULAR | Status: DC | PRN
  Administered 2012-12-18: 4 mg via INTRAVENOUS

## 2012-12-18 MED ORDER — FLUCONAZOLE 200 MG PO TABS
200.0000 mg | ORAL_TABLET | Freq: Every day | ORAL | Status: DC
  Administered 2012-12-19 – 2012-12-21 (×3): 200 mg via ORAL
  Filled 2012-12-18 (×3): qty 1

## 2012-12-18 MED ORDER — ENOXAPARIN SODIUM 40 MG/0.4ML ~~LOC~~ SOLN
40.0000 mg | SUBCUTANEOUS | Status: DC
  Administered 2012-12-19 – 2012-12-21 (×3): 40 mg via SUBCUTANEOUS
  Filled 2012-12-18 (×5): qty 0.4

## 2012-12-18 MED ORDER — ACETAMINOPHEN 325 MG PO TABS: 650.0000 mg | ORAL_TABLET | Freq: Four times a day (QID) | ORAL | Status: DC | PRN

## 2012-12-18 MED ORDER — WARFARIN SODIUM 5 MG PO TABS
5.0000 mg | ORAL_TABLET | Freq: Once | ORAL | Status: AC
  Administered 2012-12-18: 5 mg via ORAL
  Filled 2012-12-18: qty 1

## 2012-12-18 MED ORDER — CALCITRIOL 0.25 MCG PO CAPS
0.2500 ug | ORAL_CAPSULE | Freq: Every day | ORAL | Status: DC
  Administered 2012-12-19 – 2012-12-21 (×3): 0.25 ug via ORAL
  Filled 2012-12-18 (×3): qty 1

## 2012-12-18 MED ORDER — MIDAZOLAM HCL 5 MG/5ML IJ SOLN
INTRAMUSCULAR | Status: DC | PRN
  Administered 2012-12-18: 1 mg via INTRAVENOUS

## 2012-12-18 MED ORDER — HYDROMORPHONE HCL PF 1 MG/ML IJ SOLN: 0.2500 mg | INTRAMUSCULAR | Status: DC | PRN

## 2012-12-18 MED ORDER — HYDROMORPHONE HCL PF 1 MG/ML IJ SOLN
0.2500 mg | INTRAMUSCULAR | Status: DC | PRN
  Administered 2012-12-18 (×4): 0.5 mg via INTRAVENOUS

## 2012-12-18 MED ORDER — VITAMIN D3 25 MCG (1000 UNIT) PO TABS
1000.0000 [IU] | ORAL_TABLET | Freq: Every day | ORAL | Status: DC
  Administered 2012-12-19 – 2012-12-21 (×3): 1000 [IU] via ORAL
  Filled 2012-12-18 (×3): qty 1

## 2012-12-18 MED ORDER — BISACODYL 10 MG RE SUPP: 10.0000 mg | Freq: Every day | RECTAL | Status: DC | PRN

## 2012-12-18 MED ORDER — TACROLIMUS 1 MG PO CAPS
8.0000 mg | ORAL_CAPSULE | Freq: Every day | ORAL | Status: DC
  Administered 2012-12-18 – 2012-12-20 (×3): 8 mg via ORAL
  Filled 2012-12-18 (×6): qty 8

## 2012-12-18 MED ORDER — ARTIFICIAL TEARS OP OINT
TOPICAL_OINTMENT | OPHTHALMIC | Status: DC | PRN
  Administered 2012-12-18: 1 via OPHTHALMIC

## 2012-12-18 MED ORDER — TACROLIMUS 1 MG PO CAPS
8.0000 mg | ORAL_CAPSULE | Freq: Every day | ORAL | Status: DC
  Filled 2012-12-18: qty 8

## 2012-12-18 MED ORDER — MEPERIDINE HCL 25 MG/ML IJ SOLN
INTRAMUSCULAR | Status: AC
  Administered 2012-12-18: 6.25 mg via INTRAVENOUS
  Filled 2012-12-18: qty 1

## 2012-12-18 MED ORDER — LABETALOL HCL 5 MG/ML IV SOLN
INTRAVENOUS | Status: DC | PRN
  Administered 2012-12-18: 5 mg via INTRAVENOUS

## 2012-12-18 MED ORDER — PHENOL 1.4 % MT LIQD: 1.0000 | OROMUCOSAL | Status: DC | PRN

## 2012-12-18 MED ORDER — SODIUM CHLORIDE 0.9 % IR SOLN
Status: DC | PRN
  Administered 2012-12-18: 3000 mL

## 2012-12-18 MED ORDER — OXYCODONE HCL 5 MG PO TABS: 5.0000 mg | ORAL_TABLET | Freq: Once | ORAL | Status: DC | PRN

## 2012-12-18 MED ORDER — TACROLIMUS 1 MG PO CAPS
7.0000 mg | ORAL_CAPSULE | Freq: Every day | ORAL | Status: DC
  Administered 2012-12-19 – 2012-12-21 (×3): 7 mg via ORAL
  Filled 2012-12-18 (×3): qty 7

## 2012-12-18 MED ORDER — FLUTICASONE PROPIONATE 50 MCG/ACT NA SUSP
1.0000 | Freq: Two times a day (BID) | NASAL | Status: DC
  Administered 2012-12-18 – 2012-12-21 (×6): 1 via NASAL
  Filled 2012-12-18: qty 16

## 2012-12-18 MED ORDER — VITAMIN D-3 125 MCG (5000 UT) PO TABS: 1.0000 | ORAL_TABLET | Freq: Every day | ORAL | Status: DC

## 2012-12-18 MED ORDER — LACTATED RINGERS IV SOLN
INTRAVENOUS | Status: DC
  Administered 2012-12-18: 09:00:00 via INTRAVENOUS

## 2012-12-18 MED ORDER — HYDROMORPHONE HCL PF 1 MG/ML IJ SOLN
1.0000 mg | INTRAMUSCULAR | Status: DC | PRN
  Administered 2012-12-18 – 2012-12-19 (×5): 1 mg via INTRAVENOUS
  Filled 2012-12-18 (×5): qty 1

## 2012-12-18 MED ORDER — MENTHOL 3 MG MT LOZG
1.0000 | LOZENGE | OROMUCOSAL | Status: DC | PRN
  Administered 2012-12-19: 3 mg via ORAL
  Filled 2012-12-18: qty 9

## 2012-12-18 MED ORDER — ONDANSETRON HCL 4 MG PO TABS: 4.0000 mg | ORAL_TABLET | Freq: Four times a day (QID) | ORAL | Status: DC | PRN

## 2012-12-18 MED ORDER — POTASSIUM CHLORIDE IN NACL 20-0.9 MEQ/L-% IV SOLN
INTRAVENOUS | Status: DC
  Administered 2012-12-18: 15:00:00 via INTRAVENOUS
  Filled 2012-12-18 (×9): qty 1000

## 2012-12-18 MED ORDER — PROPOFOL 10 MG/ML IV BOLUS
INTRAVENOUS | Status: DC | PRN
  Administered 2012-12-18: 60 mg via INTRAVENOUS
  Administered 2012-12-18: 20 mg via INTRAVENOUS
  Administered 2012-12-18: 100 mg via INTRAVENOUS

## 2012-12-18 MED ORDER — AZELASTINE HCL 0.15 % NA SOLN: 1.0000 | Freq: Two times a day (BID) | NASAL | Status: DC

## 2012-12-18 MED ORDER — ONDANSETRON HCL 4 MG/2ML IJ SOLN: 4.0000 mg | Freq: Once | INTRAMUSCULAR | Status: DC | PRN

## 2012-12-18 MED ORDER — LIDOCAINE HCL (CARDIAC) 20 MG/ML IV SOLN
INTRAVENOUS | Status: DC | PRN
  Administered 2012-12-18: 10 mg via INTRAVENOUS

## 2012-12-18 MED ORDER — AMLODIPINE BESYLATE 10 MG PO TABS
10.0000 mg | ORAL_TABLET | Freq: Every day | ORAL | Status: DC
  Administered 2012-12-19 – 2012-12-21 (×3): 10 mg via ORAL
  Filled 2012-12-18 (×3): qty 1

## 2012-12-18 MED ORDER — PANTOPRAZOLE SODIUM 40 MG PO TBEC
40.0000 mg | DELAYED_RELEASE_TABLET | Freq: Every day | ORAL | Status: DC
  Administered 2012-12-19 – 2012-12-21 (×3): 40 mg via ORAL
  Filled 2012-12-18 (×4): qty 1

## 2012-12-18 MED ORDER — ONDANSETRON HCL 4 MG/2ML IJ SOLN: 4.0000 mg | Freq: Four times a day (QID) | INTRAMUSCULAR | Status: DC | PRN

## 2012-12-18 MED ORDER — MEPERIDINE HCL 25 MG/ML IJ SOLN
6.2500 mg | INTRAMUSCULAR | Status: DC | PRN
  Administered 2012-12-18: 6.25 mg via INTRAVENOUS

## 2012-12-18 MED ORDER — PREDNISONE 5 MG PO TABS
5.0000 mg | ORAL_TABLET | Freq: Every day | ORAL | Status: DC
  Administered 2012-12-19 – 2012-12-21 (×3): 5 mg via ORAL
  Filled 2012-12-18 (×5): qty 1

## 2012-12-18 MED ORDER — BUPIVACAINE HCL (PF) 0.5 % IJ SOLN
INTRAMUSCULAR | Status: AC
  Filled 2012-12-18: qty 30

## 2012-12-18 SURGICAL SUPPLY — 66 items
BLADE SAW SAG 73X25 THK (BLADE) ×1
BLADE SAW SGTL 73X25 THK (BLADE) ×1 IMPLANT
BOOTCOVER CLEANROOM LRG (PROTECTIVE WEAR) ×4 IMPLANT
BRUSH FEMORAL CANAL (MISCELLANEOUS) IMPLANT
CLOTH BEACON ORANGE TIMEOUT ST (SAFETY) ×2 IMPLANT
COVER BACK TABLE 24X17X13 BIG (DRAPES) IMPLANT
COVER SURGICAL LIGHT HANDLE (MISCELLANEOUS) ×2 IMPLANT
DRAPE INCISE IOBAN 66X45 STRL (DRAPES) IMPLANT
DRAPE ORTHO SPLIT 77X108 STRL (DRAPES) ×4
DRAPE SURG ORHT 6 SPLT 77X108 (DRAPES) ×2 IMPLANT
DRAPE U-SHAPE 47X51 STRL (DRAPES) ×2 IMPLANT
DRILL BIT 7/64X5 (BIT) ×2 IMPLANT
DRSG PAD ABDOMINAL 8X10 ST (GAUZE/BANDAGES/DRESSINGS) ×3 IMPLANT
DURAPREP 26ML APPLICATOR (WOUND CARE) ×2 IMPLANT
ELECT BLADE 6.5 EXT (BLADE) ×1 IMPLANT
ELECT CAUTERY BLADE 6.4 (BLADE) ×2 IMPLANT
ELECT REM PT RETURN 9FT ADLT (ELECTROSURGICAL) ×2
ELECTRODE REM PT RTRN 9FT ADLT (ELECTROSURGICAL) ×1 IMPLANT
EVACUATOR 1/8 PVC DRAIN (DRAIN) IMPLANT
FACESHIELD LNG OPTICON STERILE (SAFETY) ×4 IMPLANT
GAUZE XEROFORM 5X9 LF (GAUZE/BANDAGES/DRESSINGS) ×2 IMPLANT
GLOVE BIOGEL PI IND STRL 7.0 (GLOVE) IMPLANT
GLOVE BIOGEL PI IND STRL 8 (GLOVE) ×1 IMPLANT
GLOVE BIOGEL PI INDICATOR 7.0 (GLOVE) ×1
GLOVE BIOGEL PI INDICATOR 8 (GLOVE) ×1
GLOVE ORTHO TXT STRL SZ7.5 (GLOVE) ×5 IMPLANT
GLOVE SURG SS PI 6.5 STRL IVOR (GLOVE) ×1 IMPLANT
GLOVE SURG SS PI 7.5 STRL IVOR (GLOVE) ×1 IMPLANT
GOWN PREVENTION PLUS XLARGE (GOWN DISPOSABLE) ×2 IMPLANT
GOWN STRL NON-REIN LRG LVL3 (GOWN DISPOSABLE) ×2 IMPLANT
GOWN STRL REIN 2XL XLG LVL4 (GOWN DISPOSABLE) ×2 IMPLANT
HANDPIECE INTERPULSE COAX TIP (DISPOSABLE)
KIT BASIN OR (CUSTOM PROCEDURE TRAY) ×2 IMPLANT
KIT ROOM TURNOVER OR (KITS) ×2 IMPLANT
MANIFOLD NEPTUNE II (INSTRUMENTS) ×2 IMPLANT
NDL HYPO 25GX1X1/2 BEV (NEEDLE) ×1 IMPLANT
NEEDLE HYPO 25GX1X1/2 BEV (NEEDLE) ×2 IMPLANT
NS IRRIG 1000ML POUR BTL (IV SOLUTION) ×2 IMPLANT
PACK TOTAL JOINT (CUSTOM PROCEDURE TRAY) ×2 IMPLANT
PAD ARMBOARD 7.5X6 YLW CONV (MISCELLANEOUS) ×4 IMPLANT
PASSER SUT SWANSON 36MM LOOP (INSTRUMENTS) ×2 IMPLANT
PILLOW ABDUCTION HIP (SOFTGOODS) ×2 IMPLANT
PRESSURIZER FEMORAL UNIV (MISCELLANEOUS) IMPLANT
SET HNDPC FAN SPRY TIP SCT (DISPOSABLE) IMPLANT
SPONGE GAUZE 4X4 12PLY (GAUZE/BANDAGES/DRESSINGS) ×2 IMPLANT
SPONGE LAP 4X18 X RAY DECT (DISPOSABLE) IMPLANT
STAPLER VISISTAT 35W (STAPLE) ×2 IMPLANT
SUCTION FRAZIER TIP 10 FR DISP (SUCTIONS) ×2 IMPLANT
SUT FIBERWIRE #2 38 REV NDL BL (SUTURE) ×6
SUT VIC AB 1 CTX 36 (SUTURE) ×4
SUT VIC AB 1 CTX36XBRD ANBCTR (SUTURE) ×2 IMPLANT
SUT VIC AB 2-0 CT1 27 (SUTURE)
SUT VIC AB 2-0 CT1 TAPERPNT 27 (SUTURE) IMPLANT
SUT VIC AB 2-0 SH 27 (SUTURE) ×4
SUT VIC AB 2-0 SH 27XBRD (SUTURE) ×2 IMPLANT
SUT VIC AB 3-0 SH 27 (SUTURE)
SUT VIC AB 3-0 SH 27X BRD (SUTURE) IMPLANT
SUTURE FIBERWR#2 38 REV NDL BL (SUTURE) ×3 IMPLANT
SYR 30ML SLIP (SYRINGE) ×2 IMPLANT
SYR CONTROL 10ML LL (SYRINGE) ×2 IMPLANT
TAPE CLOTH SURG 4X10 WHT LF (GAUZE/BANDAGES/DRESSINGS) ×1 IMPLANT
TOWEL OR 17X24 6PK STRL BLUE (TOWEL DISPOSABLE) ×2 IMPLANT
TOWEL OR 17X26 10 PK STRL BLUE (TOWEL DISPOSABLE) ×2 IMPLANT
TOWER CARTRIDGE SMART MIX (DISPOSABLE) IMPLANT
TRAY FOLEY CATH 14FR (SET/KITS/TRAYS/PACK) ×2 IMPLANT
WATER STERILE IRR 1000ML POUR (IV SOLUTION) ×7 IMPLANT

## 2012-12-18 NOTE — Preoperative (Signed)
Beta Blockers   Reason not to administer Beta Blockers:Not Applicable 

## 2012-12-18 NOTE — Op Note (Signed)
NAMEMarland Ward  DOSSIE, OCANAS NO.:  [id number]  MEDICAL RECORD NO.:  [id number]  LOCATION:  5N10C                        FACILITY:  [facility]  PHYSICIAN:  Loreta Ave, M.D. 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  PROCEDURE:  Left total hip replacement, Stryker Secur-Fit prosthesis, posterolateral approach.  A press-fit 50-mm acetabular component.  Screw fixation x2.  A 36-mm internal diameter liner.  A press-fit #8 Secur-Fit stem.  A 36 -2.5 metallic head.  SURGEON:  Loreta Ave, M.D.  ASSISTANT:  Genene Churn. Barry Dienes, [place], present throughout the entire case, necessary for timely completion of procedure.  ANESTHESIA:  General.  BLOOD LOSS:  150 mL.  BLOOD GIVEN:  None.  SPECIMENS:  None.  CULTURES:  None.  COMPLICATION:  None.  DRESSINGS:  Soft compressive abduction pillow.  PROCEDURE:  The patient was brought to operating room and placed on the operating table in supine position.  After adequate anesthesia had been obtained, turned to the lateral position, appropriate padding and support.  Prepped and draped in usual sterile fashion.  Incision along the shaft of the femur extending posterosuperior.  Skin and subcutaneous tissue were divided.  Hemostasis with cautery.  Iliotibial band was incised.  Charnley retractor was put in place.  Neurovascular structure was identified and protected.  External rotator and capsule were taken down off the back of the intertrochanteric groove of the femur, tagged with FiberWire.  Hip was dislocated posteriorly.  Femoral neck cut 1 fingerbreadth above the lesser trochanter.  Acetabulum  was exposed. Debris was cleared throughout.  Labrum was cleared throughout.  Brought up to good bleeding bone, sufficient inferior and medial placement. Sized for a 50 cup.  Hammered in place at 45 degrees of abduction, 20 degrees of anteversion.  Good capturing and fixation, augmented with 2 screws through the cup.  A 36-mm internal diameter.  A 10-degree overhang placed posterosuperior.  Femur was then approached through distal reamers, proximal broaches and brought up to good fitting, proximally and distal size with a #8 component, which was seated.  After appropriate trials, a 36 -2.5 metallic head attached.  Hip was reduced. Equal leg lengths.  Grade stability.  External rotator and capsule were repaired through drill holes at the back of the intertrochanteric groove, tied over a bony bridge.  Charnley removed.  Soft tissue was injected with Exparel.  Iliotibial band was closed with #1 Vicryl.  Skin and subcutaneous tissue with Vicryl and staples.  Sterile compressive dressing was applied.  Turned to a supine position.  Abduction pillow was placed.  Anesthesia was reversed.  Brought to the recovery room. Tolerated the surgery well.  No complications.     Loreta Ave, M.D.     DFM/MEDQ  D:  12/18/2012  T:  12/18/2012  Job:  [id number]

## 2012-12-18 NOTE — Anesthesia Postprocedure Evaluation (Signed)
Anesthesia Post Note  Patient: [name]  Procedure(s) Performed: Procedure(s) (LRB): TOTAL HIP ARTHROPLASTY (Left)  Anesthesia type: general  Patient location: PACU  Post pain: Pain level controlled  Post assessment: Patient's Cardiovascular Status Stable  Last Vitals:  Filed Vitals:   12/18/12 1230  BP: 147/63  Pulse: 89  Temp:   Resp: 16    Post vital signs: Reviewed and stable  Level of consciousness: sedated  Complications: No apparent anesthesia complications

## 2012-12-18 NOTE — Progress Notes (Signed)
UR COMPLETED  

## 2012-12-18 NOTE — Plan of Care (Signed)
Problem: Consults Goal: Diagnosis- Total Joint Replacement Primary Total Hip Left     

## 2012-12-18 NOTE — Brief Op Note (Signed)
12/18/2012  1:30 PM  PATIENT:  Timoteo Ace  72 y.o. female  PRE-OPERATIVE DIAGNOSIS:  DJD LEFT HIP  POST-OPERATIVE DIAGNOSIS:  DJD LEFT HIP  PROCEDURE:  Procedure(s) (LRB) with comments: TOTAL HIP ARTHROPLASTY (Left)  SURGEON:  Surgeon(s) and Role:    * Loreta Ave, MD - Primary  PHYSICIAN ASSISTANT: Zonia Kief M   ANESTHESIA:   general  EBL:  Total I/O In: 1200 [I.V.:1200] Out: 400 [Urine:100; Blood:300]  BLOOD ADMINISTERED:none  SPECIMEN:  No Specimen  DISPOSITION OF SPECIMEN:  N/A  COUNTS:  YES  TOURNIQUET:  * No tourniquets in log *   PATIENT DISPOSITION:  PACU - hemodynamically stable.

## 2012-12-18 NOTE — Interval H&P Note (Signed)
History and Physical Interval Note:  12/18/2012 8:29 AM  [name]  has presented today for surgery, with the diagnosis of DJD LEFT HIP  The various methods of treatment have been discussed with the patient and family. After consideration of risks, benefits and other options for treatment, the patient has consented to  Procedure(s) (LRB) with comments: TOTAL HIP ARTHROPLASTY (Left) as a surgical intervention .  The patient's history has been reviewed, patient examined, no change in status, stable for surgery.  I have reviewed the patient's chart and labs.  Questions were answered to the patient's satisfaction.     Fard Borunda F

## 2012-12-18 NOTE — Anesthesia Preprocedure Evaluation (Addendum)
Anesthesia Evaluation  Patient identified by MRN, date of birth, ID band Patient awake    Reviewed: Allergy & Precautions, H&P , NPO status   Airway Mallampati: I TM Distance: >3 FB Neck ROM: Full    Dental  (+) Dental Advisory Given, Edentulous Upper and Edentulous Lower   Pulmonary shortness of breath and with exertion, asthma , pneumonia -, resolved, former smoker (quit 14y ago),  breath sounds clear to auscultation        Cardiovascular hypertension, Pt. on medications + CAD and + Past MI Rhythm:Regular Rate:Tachycardia     Neuro/Psych negative neurological ROS     GI/Hepatic Neg liver ROS, GERD-  Medicated and Controlled,  Endo/Other  diabetes, Oral Hypoglycemic Agents  Renal/GU Renal disease (Previous Renal Transplant 2012)     Musculoskeletal   Abdominal (+) + obese,   Peds  Hematology negative hematology ROS (+)   Anesthesia Other Findings   Reproductive/Obstetrics                           Anesthesia Physical Anesthesia Plan  ASA: III  Anesthesia Plan: General   Post-op Pain Management:    Induction: Intravenous  Airway Management Planned: Oral ETT  Additional Equipment:   Intra-op Plan:   Post-operative Plan: Extubation in OR  Informed Consent: I have reviewed the patients History and Physical, chart, labs and discussed the procedure including the risks, benefits and alternatives for the proposed anesthesia with the patient or authorized representative who has indicated his/her understanding and acceptance.   Dental advisory given  Plan Discussed with: Surgeon and CRNA  Anesthesia Plan Comments:        Anesthesia Quick Evaluation

## 2012-12-18 NOTE — Progress Notes (Signed)
ANTICOAGULATION CONSULT NOTE - Initial Consult  Pharmacy Consult for Coumadin Indication: VTE prophylaxis  Allergies  Allergen Reactions  . Asa Arthritis Strength-Antacid (Aspirin Buffered)   . Atorvastatin Other (See Comments)    Patient's skin was skin was sensitive  . Banana Nausea And Vomiting  . Lactose Intolerance (Gi) Other (See Comments)    Reaction unknown  . Lisinopril Cough    Patient Measurements:   Weight ~ 71 kg Height ~ 63 inches  Vital Signs: Temp: 98.3 F (36.8 C) (01/29 1430) Temp src: Oral (01/29 1430) BP: 148/71 mmHg (01/29 1430) Pulse Rate: 102  (01/29 1430)  Labs:  Basename 12/18/12 1517  HGB 10.1*  HCT 29.4*  PLT 157  APTT --  LABPROT --  INR --  HEPARINUNFRC --  CREATININE --  CKTOTAL --  CKMB --  TROPONINI --   PT/INR 12.7/ 0.96 (12/10/12) SCr 1.08 (12/10/12)   The CrCl is unknown because both a height and weight (above a minimum accepted value) are required for this calculation.   Medical History: Past Medical History  Diagnosis Date  . 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 GI bleed 11/26/2010    January, 2012 , AVM  . Coronary artery disease     cath 11/2007, grafts patent /  Nuclear, June, 2011, prior inferior MI with mild peri-infarct ischemia, anterior breast attenuation, EF 67%, done for renal transplant assessment  . Diabetes mellitus     type 2  . Hyperlipidemia   . Hypertension   . Gout   . Osteoporosis   . Aspirin allergy     possible asa allergy - tolerates low-dose aspirin  . Hyperparathyroidism   . Hx of CABG     2003  . Ejection fraction     EF 60%, echo, January, 2009 / EF 67%, nuclear, June, 2011  . S/P kidney transplant     September, 2012, [facility]  . H/O steroid therapy     Patient on prednisone after renal transplant.  . Kidney replaced by transplant 08/04/2011  . Preop cardiovascular exam     Surgical clearance for hip  surgery December 18, 2012  . Myocardial infarction   . Asthma   . Pneumonia 08/2010    Hospitalization, October, 2011  . ESRD on dialysis     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, 2012  . GERD (gastroesophageal reflux disease)     Medications:  Prescriptions prior to admission  Medication Sig Dispense Refill  . amLODipine (NORVASC) 10 MG tablet Take 10 mg by mouth daily.      Marland Kitchen aspirin EC 81 MG tablet Take 81 mg by mouth daily.        . Azelastine HCl 0.15 % SOLN Place 1 spray into the nose 2 (two) times daily.      . calcitRIOL (ROCALTROL) 0.25 MCG capsule Take 0.25 mcg by mouth daily.      . cetirizine (ZYRTEC) 10 MG tablet Take 10 mg by mouth daily as needed. For allergies      . Cholecalciferol (VITAMIN D-3) 5000 UNITS TABS Take 1 tablet by mouth daily.      . fluconazole (DIFLUCAN) 200 MG tablet Take 200 mg by mouth daily.      . fluticasone (FLONASE) 50 MCG/ACT nasal spray Place 1 spray into the nose 2 (two) times daily.      Marland Kitchen glipiZIDE (GLUCOTROL) 5 MG tablet 1/2  tab daily      . HYDROcodone-acetaminophen (NORCO/VICODIN) 5-325 MG per tablet Take 1 tablet by mouth every 8 (eight) hours as needed. For pain      . mycophenolate (CELLCEPT) 250 MG capsule Take 750 mg by mouth 2 (two) times daily.       Marland Kitchen omeprazole (PRILOSEC) 20 MG capsule Take 40 mg by mouth daily.       . pravastatin (PRAVACHOL) 40 MG tablet Take 40 mg by mouth daily.      . predniSONE (DELTASONE) 5 MG tablet Take 5 mg by mouth daily.      Marland Kitchen senna-docusate (SENOKOT-S) 8.6-50 MG per tablet Take 1-2 tablets by mouth 2 (two) times daily as needed. For constipation       . tacrolimus (PROGRAF) 1 MG capsule Take 7-8 mg by mouth 2 (two) times daily. Take 7mg  in the am and 8mg  in pm        Assessment: 72 yo F s/p L THA 12/18/2012 to start Coumadin for post-op VTE prophylaxis.  Baseline INR wnl, Hgb low but patient has chronic anemia.  Noted patient also on Fluconazole daily which can elevate INR.  Coumadin points = 4    Goal of  Therapy:  INR 2-3   Plan:  Coumadin 5mg  PO x 1 tonight. Daily INR. Coumadin education materials ordered.  [employer], Pharm.D., BCPS Clinical Pharmacist Pager [phone] 12/18/2012 3:58 PM

## 2012-12-18 NOTE — Progress Notes (Signed)
Orthopedic Tech Progress Note Patient Details:  [name] 11-04-1941 [id number] Applied overhead frame to bed.     Jennye Moccasin 12/18/2012, 3:45 PM

## 2012-12-18 NOTE — Transfer of Care (Cosign Needed)
Immediate Anesthesia Transfer of Care Note  Patient: [name]  Procedure(s) Performed: Procedure(s) (LRB) with comments: TOTAL HIP ARTHROPLASTY (Left)  Patient Location: PACU  Anesthesia Type:General  Level of Consciousness: patient cooperative, lethargic and responds to stimulation  Airway & Oxygen Therapy: Patient Spontanous Breathing and Patient connected to nasal cannula oxygen  Post-op Assessment: Report given to PACU RN and Post -op Vital signs reviewed and stable  Post vital signs: Reviewed and stable  Complications: No apparent anesthesia complications

## 2012-12-19 LAB — CBC
HCT: 25.6 % — ABNORMAL LOW (ref 36.0–46.0)
MCHC: 34 g/dL (ref 30.0–36.0)
Platelets: 153 10*3/uL (ref 150–400)
RDW: 16.7 % — ABNORMAL HIGH (ref 11.5–15.5)

## 2012-12-19 LAB — GLUCOSE, CAPILLARY
Glucose-Capillary: 130 mg/dL — ABNORMAL HIGH (ref 70–99)
Glucose-Capillary: 114 mg/dL — ABNORMAL HIGH (ref 70–99)

## 2012-12-19 LAB — BASIC METABOLIC PANEL
BUN: 14 mg/dL (ref 6–23)
GFR calc Af Amer: 66 mL/min — ABNORMAL LOW (ref >90)
GFR calc non Af Amer: 57 mL/min — ABNORMAL LOW (ref >90)
Potassium: 3.9 mEq/L (ref 3.5–5.1)
Sodium: 137 mEq/L (ref 135–145)

## 2012-12-19 LAB — HEMOGLOBIN AND HEMATOCRIT, BLOOD: HCT: 26 % — ABNORMAL LOW (ref 36.0–46.0)

## 2012-12-19 MED ORDER — WARFARIN SODIUM 5 MG PO TABS
5.0000 mg | ORAL_TABLET | Freq: Once | ORAL | Status: AC
  Administered 2012-12-19: 5 mg via ORAL
  Filled 2012-12-19: qty 1

## 2012-12-19 NOTE — Progress Notes (Addendum)
Clinical Social Work Department  BRIEF PSYCHOSOCIAL ASSESSMENT  Patient: [name]  Account Number: [id number]  Admit date: 12/18/12 Clinical Social Worker Rondal Vandevelde Riley Kill, MSW Date/Time:  Referred by: Physician Date Referred: 12/17/12 Referred for   SNF Placement   Other Referral:  Interview type: Patient  Other interview type: PSYCHOSOCIAL DATA  Living Status: Alone Admitted from facility:  Level of care:  Primary support name: [place]  Primary support relationship to patient: grandson Degree of support available:  Fair  CURRENT CONCERNS  Current Concerns   Post-Acute Placement   Other Concerns:  SOCIAL WORK ASSESSMENT / PLAN  CSW met with pt re: PT recommendation for SNF.   Pt lives alone  CSW explained placement process and answered questions.   Pt reports she pre-registered at [facility]    CSW completed FL2 and initiatedSNF search.     Assessment/plan status: Information/Referral to [facility]  Other assessment/ plan:  Information/referral to community resources:  SNF   PTAR   PATIENT'S/FAMILY'S RESPONSE TO PLAN OF CARE:  Pt  Reports she is agreeable to ST SNF in order to increase strength and independence with mobility prior to return home  Pt verbalized understanding of placement process and appreciation for CSW assist.   Sabino Niemann, MSW [phone]

## 2012-12-19 NOTE — Progress Notes (Signed)
Subjective: Doing well.  Pain controlled.   Objective: Vital signs in last 24 hours: Temp:  [98.2 F (36.8 C)-100.7 F (38.2 C)] 100.7 F (38.2 C) (01/30 0622) Pulse Rate:  [60-105] 60  (01/30 0622) Resp:  [12-18] 12  (01/30 0800) BP: (108-148)/(60-77) 108/60 mmHg (01/30 0622) SpO2:  [97 %-100 %] 99 % (01/30 0800) Weight:  [71.215 kg (157 lb)] 71.215 kg (157 lb) (01/30 0837)  Intake/Output from previous day: 01/29 0701 - 01/30 0700 In: 2265 [P.O.:460; I.V.:1805] Out: 1800 [Urine:1500; Blood:300] Intake/Output this shift: Total I/O In: -  Out: 400 [Urine:400]   Basename 12/19/12 0630 12/18/12 1517  HGB 8.7* 10.1*    Basename 12/19/12 0630 12/18/12 1517  WBC 11.9* 12.6*  RBC 3.51* 4.02  HCT 25.6* 29.4*  PLT 153 157    Basename 12/19/12 0630 12/18/12 1517  NA 137 --  K 3.9 --  CL 101 --  CO2 23 --  BUN 14 --  CREATININE 0.99 1.06  GLUCOSE 151* --  CALCIUM 9.2 --    Basename 12/19/12 0630  LABPT --  INR 1.17   Exam:   Dressing c/d/i.  Calf nt, nvi.    Assessment/Plan: Transfer to [facility]  Friday or Saturday.   Recheck hemglobin.     [name] M 12/19/2012, 1:31 PM

## 2012-12-19 NOTE — Evaluation (Deleted)
Physical Therapy Evaluation Patient Details Name: [name] MRN: [id number] DOB: [birth year] Today's Date: 12/19/2012 Time: 4098-1191 PT Time Calculation (min): 20 min  PT Assessment / Plan / Recommendation Clinical Impression  pt presents with L THA.  pt painful and limited by TDWBing, but motivated to walk.  pt would beenfit from SNF at D/C to improve mobility prior to D/C to home.      PT Assessment  Patient needs continued PT services    Follow Up Recommendations  SNF    Does the patient have the potential to tolerate intense rehabilitation      Barriers to Discharge None      Equipment Recommendations  Rolling walker with 5" wheels    Recommendations for Other Services     Frequency 7X/week    Precautions / Restrictions Precautions Precautions: Fall;Posterior Hip Precaution Booklet Issued: Yes (comment) Restrictions Weight Bearing Restrictions: Yes LLE Weight Bearing: Touchdown weight bearing   Pertinent Vitals/Pain 8/10 with mobility.        Mobility  Bed Mobility Bed Mobility: Supine to Sit;Sitting - Scoot to Edge of Bed Supine to Sit: 3: Mod assist;With rails Sitting - Scoot to Edge of Bed: 4: Min assist Details for Bed Mobility Assistance: cues for sequencing, safe technique, hip precautions.   Transfers Transfers: Sit to Stand;Stand to Sit Sit to Stand: 4: Min assist;With upper extremity assist;From bed Stand to Sit: 4: Min assist;With upper extremity assist;To chair/3-in-1;With armrests Details for Transfer Assistance: cues for UE use, positioning of LEs, controlling descent to chair.   Ambulation/Gait Ambulation/Gait Assistance: 4: Min guard Ambulation Distance (Feet): 15 Feet Assistive device: Rolling walker Ambulation/Gait Assistance Details: cues for TDWBing, positioning in RW.   Gait Pattern: Step-to pattern Stairs: No Wheelchair Mobility Wheelchair Mobility: No    Shoulder Instructions     Exercises Total Joint Exercises Ankle  Circles/Pumps: AROM;Both;10 reps Quad Sets: AROM;Both;10 reps   PT Diagnosis: Abnormality of gait;Acute pain  PT Problem List: Decreased strength;Decreased activity tolerance;Decreased balance;Decreased mobility;Decreased knowledge of use of DME;Decreased knowledge of precautions;Pain PT Treatment Interventions: DME instruction;Gait training;Stair training;Functional mobility training;Therapeutic activities;Therapeutic exercise;Balance training;Patient/family education   PT Goals Acute Rehab PT Goals PT Goal Formulation: With patient Time For Goal Achievement: 12/26/12 Potential to Achieve Goals: Good Pt will go Supine/Side to Sit: with modified independence PT Goal: Supine/Side to Sit - Progress: Goal set today Pt will go Sit to Supine/Side: with modified independence PT Goal: Sit to Supine/Side - Progress: Goal set today Pt will go Sit to Stand: with modified independence PT Goal: Sit to Stand - Progress: Goal set today Pt will go Stand to Sit: with modified independence PT Goal: Stand to Sit - Progress: Goal set today Pt will Ambulate: >150 feet;with modified independence;with rolling walker PT Goal: Ambulate - Progress: Goal set today Additional Goals Additional Goal #1: pt will verbalize and follow posterior hip precautions.   PT Goal: Additional Goal #1 - Progress: Goal set today  Visit Information  Last PT Received On: 12/19/12 Assistance Needed: +2 (helpful for chair follow)    Subjective Data  Subjective: I have to do rehab before I can go home like this.   Patient Stated Goal: Walk   Prior Functioning  Home Living Available Help at Discharge: Skilled Nursing Facility Home Adaptive Equipment: None Prior Function Level of Independence: Independent Able to Take Stairs?: Yes Driving: Yes Communication Communication: No difficulties    Cognition  Overall Cognitive Status: Appears within functional limits for tasks assessed/performed Arousal/Alertness:  Awake/alert Orientation  Level: Appears intact for tasks assessed Behavior During Session: [facility] for tasks performed    Extremity/Trunk Assessment Right Lower Extremity Assessment RLE ROM/Strength/Tone: [facility] for tasks assessed RLE Sensation: WFL - Light Touch Left Lower Extremity Assessment LLE ROM/Strength/Tone: Deficits LLE ROM/Strength/Tone Deficits: Generally weak and painful post-op.   LLE Sensation: WFL - Light Touch Trunk Assessment Trunk Assessment: Normal   Balance Balance Balance Assessed: No  End of Session PT - End of Session Equipment Utilized During Treatment: Gait belt Activity Tolerance: Patient limited by pain Patient left: in chair;with call bell/phone within reach Nurse Communication: Mobility status  GP     [name], [place] [phone] 12/19/2012, 2:57 PM

## 2012-12-19 NOTE — Clinical Social Work Placement (Addendum)
Clinical Social Work Department  CLINICAL SOCIAL WORK PLACEMENT NOTE  12/19/12 Patient: [name] Account Number:  [id number] Admit date: 12/18/12 Clinical Social Worker: Sabino Niemann LCSWA Date/time:12/19/12 3:30 AM  Clinical Social Work is seeking post-discharge placement for this patient at the following level of care: SKILLED NURSING (*CSW will update this form in Epic as items are completed)  12/19/12 Patient/family provided with [facility] Department of Clinical Social Work's list of facilities offering this level of care within the geographic area requested by the patient (or if unable, by the patient's family).  01/30/14Patient/family informed of their freedom to choose among providers that offer the needed level of care, that participate in Medicare, Medicaid or managed care program needed by the patient, have an available bed and are willing to accept the patient.  12/19/12 Patient/family informed of [facility]' ownership interest in [facility], as well as of the fact that they are under no obligation to receive care at this facility.  PASARR submitted to EDS on 12/21/12 PASARR number received from EDS on 12/21/12- on chart FL2 transmitted to all facilities in geographic area requested by pt/family on 12/19/12  FL2 transmitted to all facilities within larger geographic area on  Patient informed that his/her managed care company has contracts with or will negotiate with certain facilities, including the following:  Patient/family informed of bed offers received: 12/21/12 Patient chooses bed at [facility] Physician recommends and patient chooses bed at [facility] Patient to be transferred to [facility] on 12/21/12 Patient to be transferred to facility by 12/21/12 The following physician request were entered in Epic:  Additional Comments:

## 2012-12-19 NOTE — Progress Notes (Signed)
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  Vitals: Blood pressure 108/60, pulse 60, temperature 100.7 F (38.2 C), temperature source Oral, resp. rate 12, height 5\' 3"  (1.6 m), weight 157 lb (71.215 kg), SpO2 99.00%.  Current Labs:   Basename 12/19/12 0630 12/18/12 1517  HGB 8.7* 10.1*  HCT 25.6* 29.4*  PLT 153 157   Lab Results  Component Value Date   INR 1.17 12/19/2012   INR 0.96 12/10/2012   INR 0.99 11/27/2010   Estimated Creatinine Clearance: 50.7 ml/min (by C-G formula based on Cr of 0.99).  Assessment:  Today's INR 1.17, Subtherapeutic  No complications.  Goals:  Target INR of 2-3.  Plan:  Will repeat Coumadin 5 mg today    Daily INR's, CBC. Coumadin discharge education started.  Toluwanimi Radebaugh, Elisha Headland, Pharm. D. 12/19/2012, 8:47 AM

## 2012-12-19 NOTE — Progress Notes (Signed)
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     Action/Plan:   Patient plans to go to [facility] for shortterm rehab. Social worker, Amy Stuckey notified.   Anticipated DC Date:  12/21/2012   Anticipated DC Plan:  SKILLED NURSING FACILITY  In-house referral  Clinical Social Worker      DC Planning Services  CM consult      Choice offered to / List presented to:             Status of service:  Completed, signed off Medicare Important Message given?   (If response is "NO", the following Medicare IM given date fields will be blank) Date Medicare IM given:   Date Additional Medicare IM given:    Discharge Disposition:  SKILLED NURSING FACILITY  Per UR Regulation:    If discussed at Long Length of Stay Meetings, dates discussed:    Comments:

## 2012-12-19 NOTE — Progress Notes (Signed)
CARE MANAGEMENT NOTE 12/19/2012  Patient:  Dominski,Marvelous A   Account Number:  [id number]  Date Initiated:  12/19/2012  Documentation initiated by:  [place]il[place] Mcphee  Subjective/Objective Assessment:   72 yr old female s/p left total hip arthroplasty.     Action/Plan:   Patient plans to go to [facility] for shortterm rehab. Social worker, Amy Stuckey notified.   Anticipated DC Date:  12/21/2012   Anticipated DC Plan:  SKILLED NURSING FACILITY  In-house referral  Clinical Social Worker      DC Planning Services  CM consult      Choice offered to / List presented to:             Status of service:  Completed, signed off Medicare Important Message given?   (If response is "NO", the following Medicare IM given date fields will be blank) Date Medicare IM given:   Date Additional Medicare IM given:    Discharge Disposition:  SKILLED NURSING FACILITY  Per UR Regulation:    If discussed at Long Length of Stay Meetings, dates discussed:    Comments:     

## 2012-12-20 ENCOUNTER — Encounter (HOSPITAL_COMMUNITY): Payer: Self-pay | Admitting: Orthopedic Surgery

## 2012-12-20 LAB — GLUCOSE, CAPILLARY
Glucose-Capillary: 101 mg/dL — ABNORMAL HIGH (ref 70–99)
Glucose-Capillary: 101 mg/dL — ABNORMAL HIGH (ref 70–99)
Glucose-Capillary: 100 mg/dL — ABNORMAL HIGH (ref 70–99)
Glucose-Capillary: 67 mg/dL — ABNORMAL LOW (ref 70–99)
Glucose-Capillary: 60 mg/dL — ABNORMAL LOW (ref 70–99)
Glucose-Capillary: 65 mg/dL — ABNORMAL LOW (ref 70–99)

## 2012-12-20 LAB — CBC
MCH: 25.1 pg — ABNORMAL LOW (ref 26.0–34.0)
MCHC: 34.6 g/dL (ref 30.0–36.0)
Platelets: 125 10*3/uL — ABNORMAL LOW (ref 150–400)
RBC: 3.39 MIL/uL — ABNORMAL LOW (ref 3.87–5.11)

## 2012-12-20 LAB — BASIC METABOLIC PANEL
CO2: 26 mEq/L (ref 19–32)
Calcium: 9.6 mg/dL (ref 8.4–10.5)
GFR calc non Af Amer: 54 mL/min — ABNORMAL LOW (ref >90)
Sodium: 133 mEq/L — ABNORMAL LOW (ref 135–145)

## 2012-12-20 LAB — PROTIME-INR: Prothrombin Time: 17.9 seconds — ABNORMAL HIGH (ref 11.6–15.2)

## 2012-12-20 LAB — PREPARE RBC (CROSSMATCH)

## 2012-12-20 MED ORDER — WARFARIN SODIUM 5 MG PO TABS: 5.0000 mg | ORAL_TABLET | Freq: Every day | ORAL | Status: DC

## 2012-12-20 MED ORDER — BISACODYL 10 MG RE SUPP
10.0000 mg | Freq: Once | RECTAL | Status: AC
  Administered 2012-12-20: 10 mg via RECTAL
  Filled 2012-12-20: qty 1

## 2012-12-20 MED ORDER — METHOCARBAMOL 500 MG PO TABS: 500.0000 mg | ORAL_TABLET | Freq: Four times a day (QID) | ORAL | Status: DC | PRN

## 2012-12-20 MED ORDER — ENOXAPARIN SODIUM 40 MG/0.4ML ~~LOC~~ SOLN: 40.0000 mg | SUBCUTANEOUS | Status: DC

## 2012-12-20 MED ORDER — WARFARIN SODIUM 3 MG PO TABS
3.0000 mg | ORAL_TABLET | Freq: Once | ORAL | Status: AC
  Administered 2012-12-20: 3 mg via ORAL
  Filled 2012-12-20: qty 1

## 2012-12-20 MED ORDER — FUROSEMIDE 10 MG/ML IJ SOLN
20.0000 mg | Freq: Once | INTRAMUSCULAR | Status: AC
  Administered 2012-12-20: 20 mg via INTRAVENOUS
  Filled 2012-12-20: qty 2

## 2012-12-20 MED ORDER — HYDROCODONE-ACETAMINOPHEN 10-325 MG PO TABS: 1.0000 | ORAL_TABLET | ORAL | Status: DC | PRN

## 2012-12-20 NOTE — Progress Notes (Signed)
ANTICOAGULATION CONSULT NOTE - Follow Up Consult  Pharmacy Consult for Coumadin Indication: VTE prophylaxis  Vital Signs: BP 99/58  Pulse 119  Temp 98.4 F (36.9 C) (Oral)  Resp 22  Ht 5\' 3"  (1.6 m)  Wt 157 lb (71.215 kg)  BMI 27.81 kg/m2  SpO2 94%  Labs:  Basename 12/20/12 0537 12/19/12 1507 12/19/12 0630 12/18/12 1517  HGB 8.5* 8.8* -- --  HCT 24.6* 26.0* 25.6* --  PLT 125* -- 153 157  APTT -- -- -- --  LABPROT 17.9* -- 14.7 --  INR 1.52* -- 1.17 --  CREATININE 1.04 -- 0.99 1.06   Lab Results  Component Value Date   INR 1.52* 12/20/2012   INR 1.17 12/19/2012   INR 0.96 12/10/2012    Estimated Creatinine Clearance: 48.3 ml/min (by C-G formula based on Cr of 1.04).  Medications:  Significant interactions:    . enoxaparin (LOVENOX) injection  40 mg Subcutaneous Q24H  . fluconazole  200 mg Oral Daily    Assessment:  Patient is a 72 y/o female s/p L-THA on Coumadin post-op for VTE prophylaxis .  INR response 1.17 > 1.52 overnight.  Likely the concomitant administration of Fluconazole is resulting in a additive effect on the INR.   Will reduce Coumadin dose today to prevent overshoot of INR.  Goal of Therapy:  Target INR 2-3    Plan:  Coumadin 3 mg po today   Continue Lovenox bridging until INR goal achieved.  [name], Deetta Perla.D 12/20/2012, 12:18 PM

## 2012-12-20 NOTE — Progress Notes (Signed)
Subjective: Pain controlled.  C/o feeling fatigued and weak when up with therapy.  No c/o chest pain.     Objective: Vital signs in last 24 hours: Temp:  [98.2 F (36.8 C)-99 F (37.2 C)] 98.3 F (36.8 C) (01/31 1243) Pulse Rate:  [73-119] 113  (01/31 1243) Resp:  [16-22] 20  (01/31 1243) BP: (88-135)/(46-63) 88/48 mmHg (01/31 1243) SpO2:  [94 %-99 %] 96 % (01/31 1200)  Intake/Output from previous day: 01/30 0701 - 01/31 0700 In: 600 [P.O.:600] Out: 750 [Urine:750] Intake/Output this shift: Total I/O In: 250 [I.V.:250] Out: -    Basename 12/20/12 0537 12/19/12 1507 12/19/12 0630 12/18/12 1517  HGB 8.5* 8.8* 8.7* 10.1*    Basename 12/20/12 0537 12/19/12 1507 12/19/12 0630  WBC 13.4* -- 11.9*  RBC 3.39* -- 3.51*  HCT 24.6* 26.0* --  PLT 125* -- 153    Basename 12/20/12 0537 12/19/12 0630  NA 133* 137  K 4.0 3.9  CL 99 101  CO2 26 23  BUN 12 14  CREATININE 1.04 0.99  GLUCOSE 106* 151*  CALCIUM 9.6 9.2    Basename 12/20/12 0537 12/19/12 0630  LABPT -- --  INR 1.52* 1.17    Hip wound looks good.  Staples intact.  No signs of infection.  Calf nt, nvi.   Assessment/Plan: Symptomatic ABLA.   Transfusion 2 units or prbc's.   There is some issue regarding possible transfer to SNF for rehab.  Discussed with social worker Amy.  She will advise patient that she may need to d/c home if situation does not get worked out.    Loise Esguerra M 12/20/2012, 1:13 PM

## 2012-12-20 NOTE — Discharge Summary (Signed)
  ADDENDUM TO DISCHARGE SUMMARY  Patient will transfer to [facility].  It is ok for snf to give patient her home medications:  mycophenolate 250 MG capsule   Commonly known as: CELLCEPT   Take 750 mg by mouth 2 (two) times daily.            tacrolimus 1 MG capsule    Commonly known as: PROGRAF    Take 7-8 mg by mouth 2 (two) times daily. Take 7mg  in the am and 8mg  in pm    I reviewed this with Amy CSW who had thorough discussion with the facility.

## 2012-12-20 NOTE — Progress Notes (Signed)
OT Cancellation Note  Patient Details Name: [name] MRN: [id number] DOB: Apr 01, 1941   Cancelled Treatment:     Cancel OT today due to pt receiving blood this afternoon and stated that she did not feel up to it  Galen Manila 12/20/2012, 3:11 PM

## 2012-12-20 NOTE — Progress Notes (Signed)
Patient can go to facility, [facility] in the am.  Patient will bring her medications with her to the facility.  Sabino Niemann, MSW, [employer] [phone]

## 2012-12-20 NOTE — Progress Notes (Signed)
Physical Therapy Evaluation   12/19/12 1400  PT Visit Information  Last PT Received On 12/19/12  Assistance Needed +2 (helpful for chair follow)  PT Time Calculation  PT Start Time 1032  PT Stop Time 1052  PT Time Calculation (min) 20 min  Subjective Data  Subjective I have to do rehab before I can go home like this.    Patient Stated Goal Walk  Precautions  Precautions Fall;Posterior Hip  Precaution Booklet Issued Yes (comment)  Restrictions  Weight Bearing Restrictions Yes  LLE Weight Bearing TWB  Home Living  Available Help at Discharge Skilled Nursing Facility  Home Adaptive Equipment None  Prior Function  Level of Independence Independent  Able to Take Stairs? Yes  Driving Yes  Communication  Communication No difficulties  Cognition  Overall Cognitive Status Appears within functional limits for tasks assessed/performed  Arousal/Alertness Awake/alert  Orientation Level Appears intact for tasks assessed  Behavior During Session [facility] for tasks performed  Right Lower Extremity Assessment  RLE ROM/Strength/Tone [facility] for tasks assessed  RLE Sensation WFL - Light Touch  Left Lower Extremity Assessment  LLE ROM/Strength/Tone Deficits  LLE ROM/Strength/Tone Deficits Generally weak and painful post-op.    LLE Sensation WFL - Light Touch  Trunk Assessment  Trunk Assessment Normal  Bed Mobility  Bed Mobility Supine to Sit;Sitting - Scoot to Edge of Bed  Supine to Sit 3: Mod assist;With rails  Sitting - Scoot to Edge of Bed 4: Min assist  Details for Bed Mobility Assistance cues for sequencing, safe technique, hip precautions.    Transfers  Transfers Sit to Stand;Stand to Sit  Sit to Stand 4: Min assist;With upper extremity assist;From bed  Stand to Sit 4: Min assist;With upper extremity assist;To chair/3-in-1;With armrests  Details for Transfer Assistance cues for UE use, positioning of LEs, controlling descent to chair.    Ambulation/Gait  Ambulation/Gait Assistance 4:  Min guard  Ambulation Distance (Feet) 15 Feet  Assistive device Rolling walker  Ambulation/Gait Assistance Details cues for TDWBing, positioning in RW.    Gait Pattern Step-to pattern  Stairs No  Wheelchair Mobility  Wheelchair Mobility No  Balance  Balance Assessed No  Exercises  Exercises Total Joint  Total Joint Exercises  Ankle Circles/Pumps AROM;Both;10 reps  Quad Sets AROM;Both;10 reps  PT - End of Session  Equipment Utilized During Treatment Gait belt  Activity Tolerance Patient limited by pain  Patient left in chair;with call bell/phone within reach  Nurse Communication Mobility status  PT Assessment  Clinical Impression Statement pt presents with L THA.  pt painful and limited by TDWBing, but motivated to walk.  pt would beenfit from SNF at D/C to improve mobility prior to D/C to home.    PT Recommendation/Assessment Patient needs continued PT services  PT Problem List Decreased strength;Decreased activity tolerance;Decreased balance;Decreased mobility;Decreased knowledge of use of DME;Decreased knowledge of precautions;Pain  Barriers to Discharge None  PT Therapy Diagnosis  Abnormality of gait;Acute pain  PT Plan  PT Frequency 7X/week  PT Treatment/Interventions DME instruction;Gait training;Stair training;Functional mobility training;Therapeutic activities;Therapeutic exercise;Balance training;Patient/family education  PT Recommendation  Follow Up Recommendations SNF  PT equipment Rolling walker with 5" wheels  Individuals Consulted  Consulted and Agree with Results and Recommendations Patient  Acute Rehab PT Goals  PT Goal Formulation With patient  Time For Goal Achievement 12/26/12  Potential to Achieve Goals Good  Pt will go Supine/Side to Sit with modified independence  PT Goal: Supine/Side to Sit - Progress Goal set today  Pt will  go Sit to Supine/Side with modified independence  PT Goal: Sit to Supine/Side - Progress Goal set today  Pt will go Sit to Stand  with modified independence  PT Goal: Sit to Stand - Progress Goal set today  Pt will go Stand to Sit with modified independence  PT Goal: Stand to Sit - Progress Goal set today  Pt will Ambulate >150 feet;with modified independence;with rolling walker  PT Goal: Ambulate - Progress Goal set today  Pt will Perform Home Exercise Program with supervision, verbal cues required/provided  PT Goal: Perform Home Exercise Program - Progress Goal set today  Additional Goals  Additional Goal #1 pt will verbalize and follow posterior hip precautions.    PT Goal: Additional Goal #1 - Progress Goal set today  PT General Charges  $$ ACUTE PT VISIT 1 Procedure  PT Evaluation  $Initial PT Evaluation Tier I 1 Procedure  PT Treatments  $Gait Training 8-22 mins

## 2012-12-20 NOTE — Discharge Summary (Signed)
Physician Discharge Summary  Patient ID: [name] MRN: [id number] DOB/AGE: 70/02/1941 70 y.o.  Admit date: 12/18/2012 Discharge date: 12/20/2012  Admission Diagnoses:  Left hip avn/djd  Discharge Diagnoses:  Left total hip replacment Symptomatic ABLA  Discharged Condition: good  Hospital Course: 72 yo bf with hx of left hip avn/djd was taken to the OR for left total hip replacement 18 Dec 2012.  Tolerated surgery well and without complication.  Transferred to ortho unit and protocol coumadin and lovenox started for dvt prophylaxis.  30 jan, doing well.  Pain controlled.  Dressing c/d/i.  Awaiting transfer to snf.  hgb low.  31 jan, pain controlled.  C/o feeling fatigued and weak when up and ambulating.  Transfused 2 units of prbc's for symptomatic ABLA.  Hip wound looks good.  Staples intact.  No drainage or signs of infection.  Calf nt, nvi.   Awaiting d/c planning.    Consults:none  Discharge Exam: Blood pressure 108/55, pulse 106, temperature 98.3 F (36.8 C), temperature source Oral, resp. rate 20, height 5\' 3"  (1.6 m), weight 71.215 kg (157 lb), SpO2 96.00%.   Disposition: snf  Discharge Orders    Future Orders Please Complete By Expires   Diet - low sodium heart healthy      Call MD / Call 911      Comments:   If you experience chest pain or shortness of breath, CALL 911 and be transported to the hospital emergency room.  If you develope a fever above 101 F, pus (white drainage) or increased drainage or redness at the wound, or calf pain, call your surgeon's office.   Constipation Prevention      Comments:   Drink plenty of fluids.  Prune juice may be helpful.  You may use a stool softener, such as Colace (over the counter) 100 mg twice a day.  Use MiraLax (over the counter) for constipation as needed.   Increase activity slowly as tolerated      Discharge instructions      Comments:   Ok to shower, but no tub soaking.  Do not apply any creams or ointments to  incision.  Continue physical therapy protocol.  Touchdown weightbearing left leg until 4-6 weeks postop.   Driving restrictions      Comments:   No driving until further notice.   Follow the hip precautions as taught in Physical Therapy      Change dressing      Comments:   You may change your dressing daily with sterile 4 x 4 inch gauze dressing and paper tape.   TED hose      Comments:   Use stockings (TED hose) for 3-4 weeks on both leg(s).  You may remove them at night for sleeping.       Medication List     As of 12/20/2012  2:08 PM    STOP taking these medications         aspirin EC 81 MG tablet      HYDROcodone-acetaminophen 5-325 MG per tablet   Commonly known as: NORCO/VICODIN      TAKE these medications         amLODipine 10 MG tablet   Commonly known as: NORVASC   Take 10 mg by mouth daily.      Azelastine HCl 0.15 % Soln   Place 1 spray into the nose 2 (two) times daily.      calcitRIOL 0.25 MCG capsule   Commonly known as: ROCALTROL  Take 0.25 mcg by mouth daily.      cetirizine 10 MG tablet   Commonly known as: ZYRTEC   Take 10 mg by mouth daily as needed. For allergies      enoxaparin 40 MG/0.4ML injection   Commonly known as: LOVENOX   Inject 0.4 mLs (40 mg total) into the skin daily.      fluconazole 200 MG tablet   Commonly known as: DIFLUCAN   Take 200 mg by mouth daily.      fluticasone 50 MCG/ACT nasal spray   Commonly known as: FLONASE   Place 1 spray into the nose 2 (two) times daily.      glipiZIDE 5 MG tablet   Commonly known as: GLUCOTROL   1/2 tab daily      HYDROcodone-acetaminophen 10-325 MG per tablet   Commonly known as: NORCO   Take 1-2 tablets by mouth every 4 (four) hours as needed (breakthrough pain).      methocarbamol 500 MG tablet   Commonly known as: ROBAXIN   Take 1 tablet (500 mg total) by mouth every 6 (six) hours as needed (spasms).      mycophenolate 250 MG capsule   Commonly known as: CELLCEPT   Take 750  mg by mouth 2 (two) times daily.      omeprazole 20 MG capsule   Commonly known as: PRILOSEC   Take 40 mg by mouth daily.      pravastatin 40 MG tablet   Commonly known as: PRAVACHOL   Take 40 mg by mouth daily.      predniSONE 5 MG tablet   Commonly known as: DELTASONE   Take 5 mg by mouth daily.      senna-docusate 8.6-50 MG per tablet   Commonly known as: Senokot-S   Take 1-2 tablets by mouth 2 (two) times daily as needed. For constipation      tacrolimus 1 MG capsule   Commonly known as: PROGRAF   Take 7-8 mg by mouth 2 (two) times daily. Take 7mg  in the am and 8mg  in pm      Vitamin D-3 5000 UNITS Tabs   Take 1 tablet by mouth daily.      warfarin 5 MG tablet   Commonly known as: COUMADIN   Take 1 tablet (5 mg total) by mouth daily.       NEED ROV WITH DR MURPHY 2 WEEKS POSTOP.    SignedNaida Sleight 12/20/2012, 2:08 PM

## 2012-12-20 NOTE — Progress Notes (Signed)
Physical Therapy Treatment Patient Details Name: [name] MRN: [id number] DOB: [birth year] Today's Date: 12/20/2012 Time: 1308-6578 PT Time Calculation (min): 31 min  PT Assessment / Plan / Recommendation Comments on Treatment Session  Pt very motivated to participate. Increased activity tolerance this session with added exercises and long hall ambulation. Will continue to review precautions, WBing status and progress exerices per current plan of care.    Follow Up Recommendations  SNF           Equipment Recommendations  Rolling walker with 5" wheels    Recommendations for Other Services    Frequency 7X/week   Plan Discharge plan remains appropriate;Frequency remains appropriate    Precautions / Restrictions Precautions Precautions: Posterior Hip;Fall Precaution Comments: Pt able to recall WBing status and 1/3 precaution. Reeducated pt on all precautions throughoout treatment. Restrictions LLE Weight Bearing: Touchdown weight bearing   Pertinent Vitals/Pain no apparent distress     Mobility  Bed Mobility Supine to Sit: 4: Min assist;HOB flat Details for Bed Mobility Assistance: Assist to supoort LLE to floor. Cueing for hand placement, safe technique and to maintain precautions. Transfers Sit to Stand: 4: Min assist;With upper extremity assist;From bed Stand to Sit: 4: Min assist;To chair/3-in-1;With armrests Details for Transfer Assistance: Cues for hand placement, extending LLE and safe technique to maintain precautions.  Ambulation/Gait Ambulation/Gait Assistance: 4: Min guard Ambulation Distance (Feet): 70 Feet Assistive device: Rolling walker Ambulation/Gait Assistance Details: contsant cueing to maintain TDWBing.  Pt able to maintain upright posure with min verbal cueing. Gait Pattern: Step-to pattern    Exercises Total Joint Exercises Ankle Circles/Pumps: AROM;Both;15 reps Quad Sets: AROM;Left;10 reps Short Arc QuadBarbaraann Boys;Left;10 reps Heel Slides:  AAROM;Left;10 reps Hip ABduction/ADduction: AAROM;Left;10 reps    PT Goals Acute Rehab PT Goals PT Goal: Supine/Side to Sit - Progress: Progressing toward goal PT Goal: Sit to Supine/Side - Progress: Progressing toward goal PT Goal: Sit to Stand - Progress: Progressing toward goal PT Goal: Stand to Sit - Progress: Progressing toward goal PT Goal: Ambulate - Progress: Progressing toward goal  Visit Information  Last PT Received On: 12/20/12 Assistance Needed: +1       Cognition  Overall Cognitive Status: Appears within functional limits for tasks assessed/performed Arousal/Alertness: Awake/alert Orientation Level: Appears intact for tasks assessed Behavior During Session: [facility] for tasks performed       End of Session PT - End of Session Equipment Utilized During Treatment: Gait belt Activity Tolerance: Patient tolerated treatment well Patient left: in chair;with call bell/phone within reach   GP     Lazaro Arms 12/20/2012, 10:32 AM

## 2012-12-20 NOTE — Progress Notes (Signed)
[facility]  Patient Status: New  [facility] is providing the following services: RN, PT and OT - referral from MD office.  Note plan now is SNF .  If patient discharges after hours, please call [phone].   Jodene Nam 12/20/2012, 12:03 PM

## 2012-12-20 NOTE — Progress Notes (Signed)
Agree with SPTA.    Jonie Burdell, PT [phone]  

## 2012-12-21 LAB — BASIC METABOLIC PANEL
CO2: 26 mEq/L (ref 19–32)
Chloride: 96 mEq/L (ref 96–112)
Creatinine, Ser: 1.28 mg/dL — ABNORMAL HIGH (ref 0.50–1.10)
GFR calc Af Amer: 48 mL/min — ABNORMAL LOW (ref >90)
Potassium: 4 mEq/L (ref 3.5–5.1)
Sodium: 131 mEq/L — ABNORMAL LOW (ref 135–145)

## 2012-12-21 LAB — CBC
MCV: 72.7 fL — ABNORMAL LOW (ref 78.0–100.0)
Platelets: 129 10*3/uL — ABNORMAL LOW (ref 150–400)
RBC: 4.07 MIL/uL (ref 3.87–5.11)
RDW: 16.2 % — ABNORMAL HIGH (ref 11.5–15.5)
WBC: 13.2 10*3/uL — ABNORMAL HIGH (ref 4.0–10.5)

## 2012-12-21 LAB — TYPE AND SCREEN
ABO/RH(D): O POS
Antibody Screen: NEGATIVE
Unit division: 0

## 2012-12-21 LAB — PROTIME-INR
INR: 1.71 — ABNORMAL HIGH (ref 0.00–1.49)
Prothrombin Time: 19.5 seconds — ABNORMAL HIGH (ref 11.6–15.2)

## 2012-12-21 LAB — GLUCOSE, CAPILLARY
Glucose-Capillary: 118 mg/dL — ABNORMAL HIGH (ref 70–99)
Glucose-Capillary: 64 mg/dL — ABNORMAL LOW (ref 70–99)
Glucose-Capillary: 76 mg/dL (ref 70–99)

## 2012-12-21 MED ORDER — GLUCOSE 40 % PO GEL
ORAL | Status: AC
  Filled 2012-12-21: qty 1

## 2012-12-21 MED ORDER — GLUCOSE-VITAMIN C 4-6 GM-MG PO CHEW
CHEWABLE_TABLET | ORAL | Status: AC
  Administered 2012-12-21: 1
  Filled 2012-12-21: qty 1

## 2012-12-21 MED ORDER — HYDROCODONE-ACETAMINOPHEN 10-325 MG PO TABS: 1.0000 | ORAL_TABLET | ORAL | Status: DC | PRN

## 2012-12-21 MED ORDER — ENOXAPARIN SODIUM 40 MG/0.4ML ~~LOC~~ SOLN: 40.0000 mg | SUBCUTANEOUS | Status: DC

## 2012-12-21 MED ORDER — WARFARIN SODIUM 3 MG PO TABS
3.0000 mg | ORAL_TABLET | Freq: Once | ORAL | Status: DC
  Filled 2012-12-21: qty 1

## 2012-12-21 NOTE — Progress Notes (Signed)
Clinical Social Work  CSW spoke with [employer] who is agreeable to admission today. CSW informed patient and RN of dc and both parties agreeable. Patient reports she will call son and ask him to drop off belongings at [facility] before work. CSW prepared dc packet and coordinated transportation via [facility]. CSW is signing off but available if needs arise.  [place], [place] [phone] (Weekend Coverage)

## 2012-12-21 NOTE — Progress Notes (Signed)
Physical Therapy Treatment Patient Details Name: [name] MRN: [id number] DOB: 09/09/1941 Today's Date: 12/21/2012 Time: 4098-1191 PT Time Calculation (min): 24 min  PT Assessment / Plan / Recommendation Comments on Treatment Session  Pt is s/p lt THA with plans to go to SNF rehab before home. Progressing well towards her PT goals with incr gait distance today and less cues/assistance needed with mobility. Requires cues to follow and maintain precautions with mobility.    Follow Up Recommendations  SNF           Equipment Recommendations  Rolling walker with 5" wheels       Frequency 7X/week   Plan Discharge plan remains appropriate;Frequency remains appropriate    Precautions / Restrictions Precautions Precautions: Posterior Hip;Fall Precaution Comments: Pt able to recall TDWB and no bending past 90 degrees. Reeducated pt on all 3 post hip precautions. Restrictions LLE Weight Bearing: Touchdown weight bearing       Mobility  Bed Mobility Supine to Sit: 4: Min assist;HOB flat Sitting - Scoot to Edge of Bed: 5: Supervision Details for Bed Mobility Assistance: assist to ensure no IR of left leg with sitting up to right side of the bed. cues for use of arms to assit with trunk transition movements. Transfers Sit to Stand: 4: Min guard;From bed;With upper extremity assist Stand to Sit: 4: Min guard;To chair/3-in-1;With upper extremity assist;With armrests Details for Transfer Assistance: min vc's for hand and left LE placment with transfers. Ambulation/Gait Ambulation/Gait Assistance: 4: Min guard Ambulation Distance (Feet): 85 Feet Assistive device: Rolling walker Ambulation/Gait Assistance Details: mod cues for TDWB on left LE, incr right step length, posture and walker position with gait. Gait Pattern: Step-to pattern;Decreased stance time - left;Decreased step length - right;Decreased step length - left;Trunk flexed;Antalgic Gait velocity: Decreased    Exercises  Total Joint Exercises Ankle Circles/Pumps: AROM;Both;10 reps;Supine Quad Sets: AROM;Strengthening;Left;10 reps;Supine Short Arc Quad: AROM;Strengthening;Left;10 reps;Supine Heel Slides: AAROM;Strengthening;Left;10 reps;Supine Hip ABduction/ADduction: AAROM;Strengthening;Left;10 reps;Supine     PT Goals Acute Rehab PT Goals PT Goal: Supine/Side to Sit - Progress: Progressing toward goal PT Goal: Sit to Stand - Progress: Progressing toward goal PT Goal: Stand to Sit - Progress: Progressing toward goal PT Goal: Ambulate - Progress: Progressing toward goal PT Goal: Perform Home Exercise Program - Progress: Progressing toward goal Additional Goals PT Goal: Additional Goal #1 - Progress: Progressing toward goal  Visit Information  Last PT Received On: 12/21/12 Assistance Needed: +1    Subjective Data  Subjective: No new complaints, agreeable to therapy at this time.   Cognition  Overall Cognitive Status: Appears within functional limits for tasks assessed/performed Arousal/Alertness: Awake/alert Orientation Level: Appears intact for tasks assessed Behavior During Session: [facility] for tasks performed       End of Session PT - End of Session Equipment Utilized During Treatment: Gait belt Activity Tolerance: Patient tolerated treatment well Patient left: in chair;with call bell/phone within reach;with nursing in room Nurse Communication: Mobility status;Patient requests pain meds   GP     Sallyanne Kuster 12/21/2012, 10:56 AM  Sallyanne Kuster, PTA Office- [phone]

## 2012-12-21 NOTE — Progress Notes (Signed)
SPORTS MEDICINE AND JOINT REPLACEMENT  Georgena Spurling, MD   Altamese Cabal, PA-C [address] [place], [place], [place]  [address]                             [phone]   PROGRESS NOTE  Subjective:  negative for Chest Pain  negative for Shortness of Breath  negative for Nausea/Vomiting   negative for Calf Pain  negative for Bowel Movement   Tolerating Diet: yes         Patient reports pain as 4 on 0-10 scale.    Objective: Vital signs in last 24 hours:   Patient Vitals for the past 24 hrs:  BP Temp Temp src Pulse Resp SpO2  12/21/12 0800 - - - - 16  98 %  12/21/12 0502 135/45 mmHg 98.8 F (37.1 C) Oral 114  18  97 %  12/20/12 2054 120/57 mmHg 99.2 F (37.3 C) Oral 102  18  98 %  12/20/12 1953 130/66 mmHg 98.4 F (36.9 C) - 108  18  -  12/20/12 1905 104/58 mmHg 98.7 F (37.1 C) - 111  18  -  12/20/12 1805 118/58 mmHg 98.4 F (36.9 C) - 115  20  -  12/20/12 1705 102/51 mmHg 98.6 F (37 C) - 113  18  -  12/20/12 1650 132/59 mmHg 98.3 F (36.8 C) - 107  18  -  12/20/12 1600 - - - - 18  97 %  12/20/12 1419 107/53 mmHg 98.4 F (36.9 C) Oral 105  20  -  12/20/12 1343 108/55 mmHg 98.3 F (36.8 C) - 106  20  -  12/20/12 1243 88/48 mmHg 98.3 F (36.8 C) - 113  20  -  12/20/12 1200 - - - - 20  96 %  12/20/12 1145 99/58 mmHg 98.4 F (36.9 C) - 119  22  -  12/20/12 1126 98/46 mmHg 98.2 F (36.8 C) - 114  22  -    @flow {1959:LAST@   Intake/Output from previous day:   01/31 0701 - 02/01 0700 In: 980 [P.O.:480; I.V.:500] Out: -    Intake/Output this shift:       Intake/Output      01/31 0701 - 02/01 0700 02/01 0701 - 02/02 0700   P.O. 480    I.V. (mL/kg) 500 (7)    Total Intake(mL/kg) 980 (13.8)    Urine (mL/kg/hr)     Total Output     Net +980         Urine Occurrence 5 x       LABORATORY DATA:  Basename 12/21/12 0615 12/20/12 0537 12/19/12 1507 12/19/12 0630 12/18/12 1517  WBC 13.2* 13.4* -- 11.9* 12.6*  HGB 10.6* 8.5* 8.8* 8.7* 10.1*  HCT 29.6* 24.6*  26.0* 25.6* 29.4*  PLT 129* 125* -- 153 157    Basename 12/21/12 0615 12/20/12 0537 12/19/12 0630 12/18/12 1517  NA 131* 133* 137 --  K 4.0 4.0 3.9 --  CL 96 99 101 --  CO2 26 26 23  --  BUN 15 12 14  --  CREATININE 1.28* 1.04 0.99 1.06  GLUCOSE 90 106* 151* --  CALCIUM 10.0 9.6 9.2 --   Lab Results  Component Value Date   INR 1.71* 12/21/2012   INR 1.52* 12/20/2012   INR 1.17 12/19/2012    Examination:  General appearance: alert, cooperative and no distress Extremities: Homans sign is negative, no sign of DVT  Wound  Exam: clean, dry, intact   Drainage:  Scant/small amount Serosanguinous exudate  Motor Exam: EHL and FHL Intact  Sensory Exam:  normal   Assessment:    3 Days Post-Op  Procedure(s) (LRB): TOTAL HIP ARTHROPLASTY (Left)  ADDITIONAL DIAGNOSIS:  Active Problems:  * No active hospital problems. *   Acute Blood Loss Anemia   Plan: Physical Therapy as ordered Weight Bearing as Tolerated (WBAT)  DVT Prophylaxis:  Lovenox  DISCHARGE PLAN: Skilled Nursing Facility/Rehab  DISCHARGE NEEDS: HHPT, CPM, Walker and 3-in-1 comode seat         Ivania Teagarden 12/21/2012, 9:55 AM

## 2012-12-21 NOTE — Progress Notes (Signed)
ANTICOAGULATION CONSULT NOTE - Follow Up Consult  Pharmacy Consult for Coumadin Indication: VTE prophylaxis  Lovenox Dosing Weight: 71 kg  Vital Signs: BP 135/45  Pulse 114  Temp 98.8 F (37.1 C) (Oral)  Resp 16  Ht 5\' 3"  (1.6 m)  Wt 157 lb (71.215 kg)  BMI 27.81 kg/m2  SpO2 98%  Labs:  Basename 12/21/12 0615 12/20/12 0537 12/19/12 1507 12/19/12 0630  HGB 10.6* 8.5* -- --  HCT 29.6* 24.6* 26.0* --  PLT 129* 125* -- 153  CREATININE 1.28* 1.04 -- 0.99   Lab Results  Component Value Date   INR 1.71* 12/21/2012   INR 1.52* 12/20/2012   INR 1.17 12/19/2012    Estimated Creatinine Clearance: 39.2 ml/min (by C-G formula based on Cr of 1.28).  Medications:   Patient continues to receive Fluconazole, a potent potentiator of Coumadin.  Assessment:  INR approaching therapeutic goal.  INR 1.71, No complications noted.  Goal of Therapy:  Target INR 2-3    Plan:  Repeat Coumadin 3 mg today.   Continue Lovenox bridging 40 mg daily.  [name], Deetta Perla.D 12/21/2012, 1:49 PM

## 2013-02-25 ENCOUNTER — Ambulatory Visit (HOSPITAL_COMMUNITY)
Admission: RE | Admit: 2013-02-25 | Discharge: 2013-02-25 | Disposition: A | Discharge status: Home / Self Care | Payer: Medicare Other | Source: Ambulatory Visit | Attending: Endocrinology | Admitting: Endocrinology

## 2013-02-25 DIAGNOSIS — Z1231 Encounter for screening mammogram for malignant neoplasm of breast: Secondary | ICD-10-CM

## 2013-02-28 ENCOUNTER — Encounter: Payer: Self-pay | Admitting: Endocrinology

## 2013-02-28 ENCOUNTER — Ambulatory Visit (INDEPENDENT_AMBULATORY_CARE_PROVIDER_SITE_OTHER): Payer: Medicare Other | Admitting: Endocrinology

## 2013-02-28 VITALS — BP 134/72 | HR 74 | Wt 152.0 lb

## 2013-02-28 DIAGNOSIS — E1129 Type 2 diabetes mellitus with other diabetic kidney complication: Secondary | ICD-10-CM

## 2013-02-28 DIAGNOSIS — E119 Type 2 diabetes mellitus without complications: Secondary | ICD-10-CM | POA: Insufficient documentation

## 2013-02-28 NOTE — Patient Instructions (Addendum)
Please come back for a "medicare wellness" appointment after 06/21/13. blood tests are being requested for you today.  We'll contact you with results. Based on the results, we may need to reduce the glipizide to 1/4 pill daily. check your blood sugar once a day.  vary the time of day when you check, between before the 3 meals, and at bedtime.  also check if you have symptoms of your blood sugar being too high or too low.  please keep a record of the readings and bring it to your next appointment here.  please call us sooner if your blood sugar goes below 70, or if you have a lot of readings over 200.

## 2013-02-28 NOTE — Progress Notes (Signed)
Subjective:    Patient ID: [name], female    DOB: 09-12-1941, 72 y.o.   MRN: [id number]  HPI Pt returns for f/u of type 2 DM (dx'ed 2005; complicated by renal failure (transplant 2012) and CAD).no cbg record, but states cbg's are well-controlled on the decreased glipizide.  She denies hypoglycemia.   Past Medical History  Diagnosis Date  . 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 GI bleed 11/26/2010    January, 2012 , AVM  . Coronary artery disease     cath 11/2007, grafts patent /  Nuclear, June, 2011, prior inferior MI with mild peri-infarct ischemia, anterior breast attenuation, EF 67%, done for renal transplant assessment  . Diabetes mellitus     type 2  . Hyperlipidemia   . Hypertension   . Gout   . Osteoporosis   . Aspirin allergy     possible asa allergy - tolerates low-dose aspirin  . Hyperparathyroidism   . Hx of CABG     2003  . Ejection fraction     EF 60%, echo, January, 2009 / EF 67%, nuclear, June, 2011  . S/P kidney transplant     September, 2012, [facility]  . H/O steroid therapy     Patient on prednisone after renal transplant.  . Kidney replaced by transplant 08/04/2011  . Preop cardiovascular exam     Surgical clearance for hip surgery December 18, 2012  . Myocardial infarction   . Asthma   . Pneumonia 08/2010    Hospitalization, October, 2011  . ESRD on dialysis     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, 2012  . GERD (gastroesophageal reflux disease)   . Shortness of breath     while walking up stairs    Past Surgical History  Procedure Laterality Date  . Coronary artery bypass graft  2003  . Vesicovaginal fistula closure w/ tah  1984  . Stress cardiolite  04/01/2007  . Lower arterial  08/19/2002  . Nephrectomy transplanted organ   Right kidney  . Abdominal hysterectomy    . Breast surgery      RT BREAST CYST REMOVED   . Joint replacement  12-19-11    left hip  . Total hip  arthroplasty  12/18/2012    Procedure: TOTAL HIP ARTHROPLASTY;  Surgeon: Loreta Ave, MD;  Location: [facility];  Service: Orthopedics;  Laterality: Left;    History   Social History  . Marital Status: Divorced    Spouse Name: N/A    Number of Children: N/A  . Years of Education: N/A   Occupational History  . Not on file.   Social History Main Topics  . Smoking status: Former Smoker    Quit date: 11/21/1983  . Smokeless tobacco: Not on file  . Alcohol Use: No  . Drug Use: No  . Sexually Active: Not on file   Other Topics Concern  . Not on file   Social History Narrative  . No narrative on file    Current Outpatient Prescriptions on File Prior to Visit  Medication Sig Dispense Refill  . amLODipine (NORVASC) 10 MG tablet Take 10 mg by mouth daily.      . Azelastine HCl 0.15 % SOLN Place 1 spray into the nose 2 (two) times daily.      . calcitRIOL (ROCALTROL) 0.25 MCG capsule Take 0.25 mcg by mouth 2 (two) times daily.       Marland Kitchen  cetirizine (ZYRTEC) 10 MG tablet Take 10 mg by mouth daily as needed. For allergies      . Cholecalciferol (VITAMIN D-3) 5000 UNITS TABS Take 1 tablet by mouth daily.      Marland Kitchen enoxaparin (LOVENOX) 40 MG/0.4ML injection Inject 0.4 mLs (40 mg total) into the skin daily.  0 Syringe    . fluconazole (DIFLUCAN) 200 MG tablet Take 200 mg by mouth daily.      . fluticasone (FLONASE) 50 MCG/ACT nasal spray Place 1 spray into the nose 2 (two) times daily.      Marland Kitchen glipiZIDE (GLUCOTROL) 5 MG tablet 1/2 tab daily      . HYDROcodone-acetaminophen (NORCO) 10-325 MG per tablet Take 1-2 tablets by mouth every 4 (four) hours as needed (breakthrough pain).  30 tablet    . methocarbamol (ROBAXIN) 500 MG tablet Take 1 tablet (500 mg total) by mouth every 6 (six) hours as needed (spasms).      . mycophenolate (CELLCEPT) 250 MG capsule Take 750 mg by mouth 2 (two) times daily.       Marland Kitchen omeprazole (PRILOSEC) 20 MG capsule Take 40 mg by mouth daily.       . pravastatin (PRAVACHOL)  40 MG tablet Take 40 mg by mouth daily.      . predniSONE (DELTASONE) 5 MG tablet Take 5 mg by mouth daily.      Marland Kitchen senna-docusate (SENOKOT-S) 8.6-50 MG per tablet Take 1-2 tablets by mouth 2 (two) times daily as needed. For constipation       . tacrolimus (PROGRAF) 1 MG capsule Take 7-8 mg by mouth 2 (two) times daily. Take 7mg  in the am and 8mg  in pm      . warfarin (COUMADIN) 5 MG tablet Take 1 tablet (5 mg total) by mouth daily.       No current facility-administered medications on file prior to visit.    Allergies  Allergen Reactions  . Asa Arthritis Strength-Antacid (Aspirin Buffered)   . Atorvastatin Other (See Comments)    Patient's skin was skin was sensitive  . Banana Nausea And Vomiting  . Lactose Intolerance (Gi) Other (See Comments)    Reaction unknown  . Lisinopril Cough    Family History  Problem Relation Age of Onset  . Cancer Neg Hx     BP 134/72  Pulse 74  Wt 152 lb (68.947 kg)  BMI 26.93 kg/m2  SpO2 96%    Review of Systems She lost a few lbs, surrounding her recent left THR.      Objective:   Physical Exam VITAL SIGNS:  See vs page GENERAL: no distress Pulses: dorsalis pedis intact bilat.   Feet: no deformity.  no ulcer on the feet.  feet are of normal color and temp.  no edema.  There is bilateral onychomycosis Neuro: sensation is intact to touch on the feet     Assessment & Plan:  Type 2 DM, ? Still overcontrolled

## 2013-03-10 ENCOUNTER — Encounter: Payer: Self-pay | Admitting: *Deleted

## 2013-11-10 ENCOUNTER — Ambulatory Visit (INDEPENDENT_AMBULATORY_CARE_PROVIDER_SITE_OTHER): Payer: Medicare Other | Admitting: Physician Assistant

## 2013-11-10 ENCOUNTER — Encounter: Payer: Self-pay | Admitting: Physician Assistant

## 2013-11-10 VITALS — BP 140/70 | HR 93 | Ht 63.0 in | Wt 153.0 lb

## 2013-11-10 DIAGNOSIS — I10 Essential (primary) hypertension: Secondary | ICD-10-CM

## 2013-11-10 DIAGNOSIS — R0602 Shortness of breath: Secondary | ICD-10-CM

## 2013-11-10 DIAGNOSIS — Z940 Kidney transplant status: Secondary | ICD-10-CM

## 2013-11-10 DIAGNOSIS — E785 Hyperlipidemia, unspecified: Secondary | ICD-10-CM

## 2013-11-10 DIAGNOSIS — N189 Chronic kidney disease, unspecified: Secondary | ICD-10-CM

## 2013-11-10 DIAGNOSIS — R5383 Other fatigue: Secondary | ICD-10-CM

## 2013-11-10 DIAGNOSIS — R5381 Other malaise: Secondary | ICD-10-CM

## 2013-11-10 DIAGNOSIS — I2510 Atherosclerotic heart disease of native coronary artery without angina pectoris: Secondary | ICD-10-CM

## 2013-11-10 NOTE — Patient Instructions (Signed)
Your physician has requested that you have a lexiscan myoview. For further information please visit [email or web address] Please follow instruction sheet, as given.  Your physician has requested that you have an echocardiogram. Echocardiography is a painless test that uses sound waves to create images of your heart. It provides your doctor with information about the size and shape of your heart and how well your heart's chambers and valves are working. This procedure takes approximately one hour. There are no restrictions for this procedure.  PLEASE FOLLOW UP WITH DR. KATZ IN ABOUT 1 MONTH

## 2013-11-10 NOTE — Progress Notes (Signed)
[address], [address] [place], [place]  [address] Phone: [phone] Fax:  [phone]  Date:  11/10/2013   ID:  [name], DOB 1941-10-02, MRN [id number]  PCP:  Romero Belling, MD  Cardiologist:  Dr. Zackery Barefoot     History of Present Illness: [name] is a 72 y.o. female with a history of CAD, status post CABG, T2DM, HTN, HL, ESRD previously on hemodialysis status post renal transplant 07/2011, prior GI bleed secondary to AVMs.  LHC (11/2007): SVG-PDA patent, SVG-OM patent SVG-diagonal patent, LIMA-LAD patent, EF 60%.  Echocardiogram (11/2007): EF 65%.  Myoview (04/2010): Inferior infarct with very mild peri-infarct ischemia, EF 67%, anterior defect consistent with breast attenuation; low risk study. Last seen by Dr. Myrtis Ser 11/2012.  She recently had nausea, vomiting, diarrhea. Her tacrolimus levels were high and these were adjusted. She also notes fatigue with any activity she does. This symptom is unchanged with recent dose adjustments with her tacrolimus. She notes dyspnea with exertion. She describes NYHA class III symptoms. She denies orthopnea, PND or edema. She denies weight gain. She denies any chest discomfort. She denies syncope.  Recent Labs: 12/10/2012: ALT 6  12/21/2012: Creatinine 1.28*; Hemoglobin 10.6*; Potassium 4.0 1216/2014: Hgb 12.5, K 3.4, creatinine 1.16, ALT 9, HDL 38, LDL 50, TSH 0.497  Wt Readings from Last 3 Encounters:  11/10/13 153 lb (69.4 kg)  02/28/13 152 lb (68.947 kg)  12/19/12 157 lb (71.215 kg)     Past Medical History  Diagnosis Date  . 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 GI bleed 11/26/2010    January, 2012 , AVM  . Coronary artery disease     cath 11/2007, grafts patent /  Nuclear, June, 2011, prior inferior MI with mild peri-infarct ischemia, anterior breast attenuation, EF 67%, done for renal transplant assessment  . Diabetes mellitus     type 2    . Hyperlipidemia   . Hypertension   . Gout   . Osteoporosis   . Aspirin allergy     possible asa allergy - tolerates low-dose aspirin  . Hyperparathyroidism   . Hx of CABG     2003  . Ejection fraction     EF 60%, echo, January, 2009 / EF 67%, nuclear, June, 2011  . S/P kidney transplant     September, 2012, [facility]  . H/O steroid therapy     Patient on prednisone after renal transplant.  . Kidney replaced by transplant 08/04/2011  . Preop cardiovascular exam     Surgical clearance for hip surgery December 18, 2012  . Myocardial infarction   . Asthma   . Pneumonia 08/2010    Hospitalization, October, 2011  . ESRD on dialysis     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, 2012  . GERD (gastroesophageal reflux disease)   . Shortness of breath     while walking up stairs    Current Outpatient Prescriptions  Medication Sig Dispense Refill  . amLODipine (NORVASC) 10 MG tablet Take 10 mg by mouth daily.      . Azelastine HCl 0.15 % SOLN Place 1 spray into the nose 2 (two) times daily.      . calcitRIOL (ROCALTROL) 0.25 MCG capsule Take 0.25 mcg by mouth 2 (two) times daily.       . cetirizine (ZYRTEC) 10 MG tablet Take 10 mg by mouth daily as needed. For  allergies      . fluconazole (DIFLUCAN) 200 MG tablet Take 200 mg by mouth daily.      . fluticasone (FLONASE) 50 MCG/ACT nasal spray Place 1 spray into the nose 2 (two) times daily.      Marland Kitchen glipiZIDE (GLUCOTROL) 5 MG tablet 1/2 tab daily      . mycophenolate (CELLCEPT) 250 MG capsule Take 750 mg by mouth 2 (two) times daily.       Marland Kitchen omeprazole (PRILOSEC) 20 MG capsule Take 40 mg by mouth daily.       . pravastatin (PRAVACHOL) 40 MG tablet Take 40 mg by mouth daily.      . predniSONE (DELTASONE) 5 MG tablet Take 5 mg by mouth daily.      Marland Kitchen senna-docusate (SENOKOT-S) 8.6-50 MG per tablet Take 1-2 tablets by mouth 2 (two) times daily as needed. For constipation       . tacrolimus (PROGRAF) 1 MG capsule Take 2-3 mg by mouth 2 (two) times daily. Take  2mg  in the am and 3mg  in pm q12hrs       No current facility-administered medications for this visit.    Allergies:   Asa arthritis strength-antacid; Atorvastatin; Banana; Lactose intolerance (gi); and Lisinopril   Social History:  The patient  reports that she quit smoking about 29 years ago. She does not have any smokeless tobacco history on file. She reports that she does not drink alcohol or use illicit drugs.   Family History:  The patient's family history is negative for Cancer.   ROS:  Please see the history of present illness.   She does note improved diarrhea. She continues to have nausea and vomiting. This is particularly worse if she eats and then tries to exert herself.   All other systems reviewed and negative.   PHYSICAL EXAM: VS:  BP 140/70  Pulse 93  Ht 5\' 3"  (1.6 m)  Wt 153 lb (69.4 kg)  BMI 27.11 kg/m2 Well nourished, well developed, in no acute distress HEENT: normal Neck: no JVD Cardiac:  normal S1, S2; RRR; no murmur Lungs:  clear to auscultation bilaterally, no wheezing, rhonchi or rales Abd: soft, nontender, no hepatomegaly Ext: no edema Skin: warm and dry Neuro:  CNs 2-12 intact, no focal abnormalities noted  EKG:  NSR, HR 93, normal axis, nonspecific ST-T wave changes, no change from prior tracing     ASSESSMENT AND PLAN:  1. Dyspnea: She has significant dyspnea with exertion and fatigue. She recently had lab evaluation without significant abnormality. She is not volume overloaded on exam. Her ejection fraction has been normal in the past. She has had some adjustments made in her tacrolimus. She has had some improvement in her symptoms. She continues to have nausea and vomiting. It has been several years since her last assessment for ischemia.  I will arrange an echocardiogram to assess her LV function and assess her right-sided heart pressures. I will arrange a Lexiscan Myoview to rule out significant ischemia. 2. CAD: She has not had symptoms consistent  with her prior angina. Arrange stress testing as noted.  Continue statin. I assume that she does not take aspirin given her prior history of GI bleeding. 3. Hyperlipidemia: Continue statin. 4. Hypertension: Borderline control. Continue to monitor. 5. Chronic Kidney Disease: Continue followup with nephrology. 6. Disposition: Follow up with Dr. Myrtis Ser in one month.  Signed, Tereso Newcomer, PA-C  11/10/2013 4:28 PM

## 2013-11-26 ENCOUNTER — Encounter: Payer: Self-pay | Admitting: Cardiovascular Disease

## 2013-11-26 ENCOUNTER — Encounter: Payer: Self-pay | Admitting: Physician Assistant

## 2013-11-26 ENCOUNTER — Ambulatory Visit (HOSPITAL_COMMUNITY): Payer: Medicare Other | Attending: Cardiovascular Disease | Admitting: Radiology

## 2013-11-26 DIAGNOSIS — R5381 Other malaise: Secondary | ICD-10-CM | POA: Insufficient documentation

## 2013-11-26 DIAGNOSIS — Z940 Kidney transplant status: Secondary | ICD-10-CM | POA: Insufficient documentation

## 2013-11-26 DIAGNOSIS — I079 Rheumatic tricuspid valve disease, unspecified: Secondary | ICD-10-CM | POA: Insufficient documentation

## 2013-11-26 DIAGNOSIS — I379 Nonrheumatic pulmonary valve disorder, unspecified: Secondary | ICD-10-CM | POA: Insufficient documentation

## 2013-11-26 DIAGNOSIS — R0602 Shortness of breath: Secondary | ICD-10-CM | POA: Insufficient documentation

## 2013-11-26 DIAGNOSIS — R5383 Other fatigue: Secondary | ICD-10-CM

## 2013-11-26 DIAGNOSIS — E785 Hyperlipidemia, unspecified: Secondary | ICD-10-CM | POA: Insufficient documentation

## 2013-11-26 DIAGNOSIS — I25810 Atherosclerosis of coronary artery bypass graft(s) without angina pectoris: Secondary | ICD-10-CM

## 2013-11-26 DIAGNOSIS — I2510 Atherosclerotic heart disease of native coronary artery without angina pectoris: Secondary | ICD-10-CM

## 2013-11-26 DIAGNOSIS — I10 Essential (primary) hypertension: Secondary | ICD-10-CM

## 2013-11-26 NOTE — Progress Notes (Signed)
Echocardiogram performed.  

## 2013-12-01 ENCOUNTER — Encounter: Payer: Self-pay | Admitting: Cardiology

## 2013-12-01 ENCOUNTER — Ambulatory Visit (HOSPITAL_COMMUNITY): Payer: Medicare Other | Attending: Cardiology | Admitting: Radiology

## 2013-12-01 VITALS — BP 133/71 | HR 94 | Ht 63.0 in | Wt 150.0 lb

## 2013-12-01 DIAGNOSIS — Z951 Presence of aortocoronary bypass graft: Secondary | ICD-10-CM | POA: Insufficient documentation

## 2013-12-01 DIAGNOSIS — E119 Type 2 diabetes mellitus without complications: Secondary | ICD-10-CM | POA: Insufficient documentation

## 2013-12-01 DIAGNOSIS — Z8249 Family history of ischemic heart disease and other diseases of the circulatory system: Secondary | ICD-10-CM | POA: Insufficient documentation

## 2013-12-01 DIAGNOSIS — I2510 Atherosclerotic heart disease of native coronary artery without angina pectoris: Secondary | ICD-10-CM | POA: Insufficient documentation

## 2013-12-01 DIAGNOSIS — E785 Hyperlipidemia, unspecified: Secondary | ICD-10-CM | POA: Insufficient documentation

## 2013-12-01 DIAGNOSIS — R109 Unspecified abdominal pain: Secondary | ICD-10-CM

## 2013-12-01 DIAGNOSIS — I252 Old myocardial infarction: Secondary | ICD-10-CM | POA: Insufficient documentation

## 2013-12-01 DIAGNOSIS — R0602 Shortness of breath: Secondary | ICD-10-CM

## 2013-12-01 DIAGNOSIS — R0989 Other specified symptoms and signs involving the circulatory and respiratory systems: Principal | ICD-10-CM | POA: Insufficient documentation

## 2013-12-01 DIAGNOSIS — I10 Essential (primary) hypertension: Secondary | ICD-10-CM

## 2013-12-01 DIAGNOSIS — R5383 Other fatigue: Secondary | ICD-10-CM

## 2013-12-01 DIAGNOSIS — R0609 Other forms of dyspnea: Secondary | ICD-10-CM | POA: Insufficient documentation

## 2013-12-01 DIAGNOSIS — Z87891 Personal history of nicotine dependence: Secondary | ICD-10-CM | POA: Insufficient documentation

## 2013-12-01 MED ORDER — TECHNETIUM TC 99M SESTAMIBI GENERIC - CARDIOLITE
30.0000 | Freq: Once | INTRAVENOUS | Status: AC | PRN
  Administered 2013-12-01: 30 via INTRAVENOUS

## 2013-12-01 MED ORDER — AMINOPHYLLINE 25 MG/ML IV SOLN
75.0000 mg | Freq: Once | INTRAVENOUS | Status: AC
  Administered 2013-12-01: 75 mg via INTRAVENOUS

## 2013-12-01 MED ORDER — TECHNETIUM TC 99M SESTAMIBI GENERIC - CARDIOLITE
10.0000 | Freq: Once | INTRAVENOUS | Status: AC | PRN
  Administered 2013-12-01: 10 via INTRAVENOUS

## 2013-12-01 MED ORDER — REGADENOSON 0.4 MG/5ML IV SOLN
0.4000 mg | Freq: Once | INTRAVENOUS | Status: AC
  Administered 2013-12-01: 0.4 mg via INTRAVENOUS

## 2013-12-01 NOTE — Progress Notes (Signed)
[facility] [address] [place], [place] [address] [phone]    Cardiology Nuclear Med Study  [name] is a 73 y.o. female     MRN : [id number]     DOB: January 01, 1941  Procedure Date: 12/01/2013  Nuclear Med Background Indication for Stress Test:  Evaluation for Ischemia and Graft Patency History: CAD, MI, Cath 2009, CABG 2003, Echo 2015 EF  55-60%, MPI 2011 (mild ischemia - low risk) EF 67%, Asthma Cardiac Risk Factors: Family History - CAD, History of Smoking, Hypertension, Lipids and NIDDM  Symptoms:  DOE   Nuclear Pre-Procedure Caffeine/Decaff Intake:  None > 12 hrs NPO After: 10:00pm   Lungs:  clear O2 Sat: 96% on room air. IV 0.9% NS with Angio Cath:  24g  IV Site: R Hand, tolerated well IV Started by:  Irven Baltimore, RN  Chest Size (in):  38 Cup Size: C  Height: 5\' 3"  (1.6 m)  Weight:  150 lb (68.04 kg)  BMI:  Body mass index is 26.58 kg/(m^2). Tech Comments:  No medications today    Nuclear Med Study 1 or 2 day study: 1 day  Stress Test Type:  Treadmill/Lexiscan  Reading MD: N/A  Order Authorizing Provider:  Dola Argyle, MD, and Richardson Dopp, PAC  Resting Radionuclide: Technetium 50m Sestamibi  Resting Radionuclide Dose: 11.0 mCi   Stress Radionuclide:  Technetium 61m Sestamibi  Stress Radionuclide Dose: 33.0 mCi           Stress Protocol Rest HR: 94 Stress HR: 133  Rest BP: 133/71 Stress BP: 206/77  /Exercise Time (min): n/a METS: n/a           Dose of Adenosine (mg):  n/a Dose of Lexiscan: 0.4 mg  Dose of Atropine (mg): n/a Dose of Dobutamine: n/a mcg/kg/min (at max HR)  Stress Test Technologist: Glade Lloyd, BS-ES  Nuclear Technologist:  Charlton Amor, CNMT     Rest Procedure:  Myocardial perfusion imaging was performed at rest 45 minutes following the intravenous administration of Technetium 26m Sestamibi. Rest ECG: NSR - Normal EKG  Stress Procedure:  The patient received IV Lexiscan 0.4 mg over 15-seconds  with concurrent low level exercise and then Technetium 29m Sestamibi was injected at 30-seconds while the patient continued walking one more minute.  Quantitative spect images were obtained after a 45-minute delay.  During the infusion of Lexiscan, the patient complained of SOB, heavy legs and stomach cramping.  The stomach cramping continued with some intensity.  Patient began to feel better within 2-3 minutes of administering 75 mg aminophylline.  Stress ECG: No significant change from baseline ECG  QPS Raw Data Images:  Normal; no motion artifact; normal heart/lung ratio. Stress Images:  Normal homogeneous uptake in all areas of the myocardium. Rest Images:  Normal homogeneous uptake in all areas of the myocardium. Subtraction (SDS):  No evidence of ischemia. Transient Ischemic Dilatation (Normal <1.22):  1.01 Lung/Heart Ratio (Normal <0.45):  0.27  Quantitative Gated Spect Images QGS EDV:  78 ml QGS ESV:  26 ml  Impression Exercise Capacity:  Lexiscan with low level exercise. BP Response:  Hypertensive blood pressure response. Clinical Symptoms:  No chest pain. ECG Impression:  No significant ST segment change suggestive of ischemia. Comparison with Prior Nuclear Study: No images to compare  Overall Impression:  Normal stress nuclear study.  LV Ejection Fraction: 67%.  LV Wall Motion:  NL LV Function; NL Wall Motion  [employer]

## 2013-12-02 ENCOUNTER — Encounter: Payer: Self-pay | Admitting: Physician Assistant

## 2013-12-19 ENCOUNTER — Encounter: Payer: Self-pay | Admitting: Cardiology

## 2013-12-22 ENCOUNTER — Ambulatory Visit (INDEPENDENT_AMBULATORY_CARE_PROVIDER_SITE_OTHER): Payer: Medicare Other | Admitting: Cardiology

## 2013-12-22 ENCOUNTER — Encounter: Payer: Self-pay | Admitting: Cardiology

## 2013-12-22 VITALS — BP 126/70 | HR 100 | Ht 63.0 in | Wt 147.0 lb

## 2013-12-22 DIAGNOSIS — R0989 Other specified symptoms and signs involving the circulatory and respiratory systems: Secondary | ICD-10-CM

## 2013-12-22 DIAGNOSIS — R112 Nausea with vomiting, unspecified: Secondary | ICD-10-CM

## 2013-12-22 DIAGNOSIS — R9430 Abnormal result of cardiovascular function study, unspecified: Secondary | ICD-10-CM

## 2013-12-22 DIAGNOSIS — I2510 Atherosclerotic heart disease of native coronary artery without angina pectoris: Secondary | ICD-10-CM

## 2013-12-22 DIAGNOSIS — IMO0002 Reserved for concepts with insufficient information to code with codable children: Secondary | ICD-10-CM

## 2013-12-22 DIAGNOSIS — R0609 Other forms of dyspnea: Secondary | ICD-10-CM

## 2013-12-22 DIAGNOSIS — R0600 Dyspnea, unspecified: Secondary | ICD-10-CM | POA: Insufficient documentation

## 2013-12-22 NOTE — Assessment & Plan Note (Signed)
Her ejection fraction is normal. There no valvular abnormalities. There is no significant abnormality of an echo.

## 2013-12-22 NOTE — Assessment & Plan Note (Signed)
It is my understanding that this is probably related to the elevated level of Prograf. She is improving.

## 2013-12-22 NOTE — Patient Instructions (Signed)
**Note De-Identified  Obfuscation** Your physician recommends that you continue on your current medications as directed. Please refer to the Current Medication list given to you today.  Your physician recommends that you schedule a follow-up appointment in: 10 weeks

## 2013-12-22 NOTE — Assessment & Plan Note (Signed)
Stress test from December 01, 2013 reveals no ischemia. Ejection fraction is normal. No further ischemia workup is recommended at this time.

## 2013-12-22 NOTE — Progress Notes (Signed)
HPI  Patient is seen today to followup some shortness of breath. She has a renal transplant. She was seen in the office on December 22nd, 2014. Her complex of symptoms included nausea, vomiting, diarrhea, and some shortness of breath. She has had an increase level of Prograf. This is being managed by her renal team. This is decreasing. Her GI symptoms are improving. She still has some mild shortness of breath.  As part of evaluation when she was seen in December plans were made for a nuclear stress test and an echo. The echo revealed an ejection fraction of 55-60%. There were no significant wall motion abnormalities. There were no significant valvular abnormalities. She had a nuclear stress study. The study was low risk. There was no scar or ischemia. Ejection fraction was 65%. She returns today for followup.  Allergies  Allergen Reactions  . Asa Arthritis Strength-Antacid [Aspirin Buffered]   . Atorvastatin Other (See Comments)    Patient's skin was skin was sensitive  . Banana Nausea And Vomiting  . Lactose Intolerance (Gi) Other (See Comments)    Reaction unknown  . Lisinopril Cough    Current Outpatient Prescriptions  Medication Sig Dispense Refill  . amLODipine (NORVASC) 10 MG tablet Take 10 mg by mouth daily.      . Azelastine HCl 0.15 % SOLN Place 1 spray into the nose 2 (two) times daily.      . calcitRIOL (ROCALTROL) 0.25 MCG capsule Take 0.25 mcg by mouth 2 (two) times daily.       . fluconazole (DIFLUCAN) 200 MG tablet Take 200 mg by mouth daily.      Marland Kitchen glipiZIDE (GLUCOTROL) 5 MG tablet 1/2 tab daily      . mycophenolate (CELLCEPT) 250 MG capsule Take 750 mg by mouth 2 (two) times daily.       Marland Kitchen omeprazole (PRILOSEC) 20 MG capsule Take 40 mg by mouth daily.       . pravastatin (PRAVACHOL) 40 MG tablet Take 40 mg by mouth daily.      . predniSONE (DELTASONE) 5 MG tablet Take 5 mg by mouth daily.      Marland Kitchen senna-docusate (SENOKOT-S) 8.6-50 MG per tablet Take 1-2 tablets by  mouth 2 (two) times daily as needed. For constipation       . tacrolimus (PROGRAF) 1 MG capsule Take 2-3 mg by mouth 2 (two) times daily. Take 2mg  in the am and 3mg  in pm q12hrs      . cetirizine (ZYRTEC) 10 MG tablet Take 10 mg by mouth daily as needed. For allergies      . fluticasone (FLONASE) 50 MCG/ACT nasal spray Place 1 spray into the nose 2 (two) times daily.       No current facility-administered medications for this visit.    History   Social History  . Marital Status: Divorced    Spouse Name: N/A    Number of Children: N/A  . Years of Education: N/A   Occupational History  . Not on file.   Social History Main Topics  . Smoking status: Former Smoker    Quit date: 11/21/1983  . Smokeless tobacco: Not on file  . Alcohol Use: No  . Drug Use: No  . Sexual Activity: Not on file   Other Topics Concern  . Not on file   Social History Narrative  . No narrative on file    Family History  Problem Relation Age of Onset  . Cancer Neg Hx  Past Medical History  Diagnosis Date  . 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 GI bleed 11/26/2010    January, 2012 , AVM  . Coronary artery disease     cath 11/2007, grafts patent /  Nuclear, June, 2011, prior inferior MI with mild peri-infarct ischemia, anterior breast attenuation, EF 67%, done for renal transplant assessment / Low level exercise/Lexiscan Myoview (11/2013): EF 67%, no ischemi; normal study  . Diabetes mellitus     type 2  . Hyperlipidemia   . Hypertension   . Gout   . Osteoporosis   . Aspirin allergy     possible asa allergy - tolerates low-dose aspirin  . Hyperparathyroidism   . Hx of CABG     2003  . Ejection fraction     a. EF 60%, echo, January, 2009;  b.  EF 67%, nuclear, June, 2011;  c.  Echo (11/2013): EF 55-60%, normal wall motion, normal diastolic function, normal RV function  . S/P kidney transplant     September,  2012, [facility]  . H/O steroid therapy     Patient on prednisone after renal transplant.  . Kidney replaced by transplant 08/04/2011  . Preop cardiovascular exam     Surgical clearance for hip surgery December 18, 2012  . Myocardial infarction   . Asthma   . Pneumonia 08/2010    Hospitalization, October, 2011  . ESRD on dialysis     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, 2012  . GERD (gastroesophageal reflux disease)   . Shortness of breath     while walking up stairs    Past Surgical History  Procedure Laterality Date  . Coronary artery bypass graft  2003  . Vesicovaginal fistula closure w/ tah  1984  . Stress cardiolite  04/01/2007  . Lower arterial  08/19/2002  . Nephrectomy transplanted organ   Right kidney  . Abdominal hysterectomy    . Breast surgery      RT BREAST CYST REMOVED   . Joint replacement  12-19-11    left hip  . Total hip arthroplasty  12/18/2012    Procedure: TOTAL HIP ARTHROPLASTY;  Surgeon: Loreta Ave, MD;  Location: [facility] OR;  Service: Orthopedics;  Laterality: Left;    Patient Active Problem List   Diagnosis Date Noted  . Anemia     Priority: High  . Aspirin allergy     Priority: High  . Ejection fraction     Priority: High  . Diabetes 02/28/2013  . Preop cardiovascular exam   . Type II or unspecified type diabetes mellitus without mention of complication, not stated as uncontrolled 06/05/2012  . Encounter for long-term (current) use of other medications 02/26/2012  . Type II or unspecified type diabetes mellitus with renal manifestations, not stated as uncontrolled 02/26/2012  . S/P kidney transplant   . H/O steroid therapy   . Coronary artery disease   . Hyperlipidemia   . Hypertension   . Hx of CABG   . Anemia   . GI bleed 11/26/2010  . Pneumonia 08/20/2010  . FOOT PAIN 01/20/2010  . THROMBOCYTOPENIA 01/11/2009  . 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 GOUT 08/12/2007  . ASTHMA 08/12/2007  . MENOPAUSAL SYNDROME 08/12/2007  . OSTEOPOROSIS  08/12/2007  . VERTIGO 08/12/2007    ROS   Patient denies fever, chills, headache, sweats, rash, change in vision, change in hearing, chest pain , cough,.  She still has some nausea and intermittent vomiting. All other systems are reviewed and are negative.  PHYSICAL EXAM  Patient is oriented to person time and place. Affect is normal. There is no jugular venous distention. Lungs are clear. Respiratory effort is nonlabored. Cardiac exam reveals an S1 and S2. There no clicks or significant murmurs. The abdomen is soft. There is no peripheral edema.  Filed Vitals:   12/22/13 1451  BP: 126/70  Pulse: 100  Height: 5\' 3"  (1.6 m)  Weight: 147 lb (66.679 kg)     ASSESSMENT & PLAN

## 2013-12-22 NOTE — Assessment & Plan Note (Signed)
Etiology of the patient's recent dyspnea is not clear. It is somewhat improved. I am hopeful that when she gets over her recent overall illness with the elevated Prograf that she will feel better. At this point there is no definite proof of a cardiac basis for her shortness of breath. No further testing at this time. I will plan to see her back for followup.

## 2014-01-28 ENCOUNTER — Other Ambulatory Visit: Payer: Self-pay | Admitting: Endocrinology

## 2014-01-28 DIAGNOSIS — Z1231 Encounter for screening mammogram for malignant neoplasm of breast: Secondary | ICD-10-CM

## 2014-02-11 ENCOUNTER — Other Ambulatory Visit: Payer: Self-pay | Admitting: Gastroenterology

## 2014-02-18 ENCOUNTER — Telehealth: Payer: Self-pay | Admitting: Endocrinology

## 2014-02-18 NOTE — Telephone Encounter (Signed)
please call patient: "medicare wellness" visit is due

## 2014-02-23 NOTE — Telephone Encounter (Signed)
Phone number on file is constantly busy.

## 2014-02-24 ENCOUNTER — Ambulatory Visit (INDEPENDENT_AMBULATORY_CARE_PROVIDER_SITE_OTHER): Payer: Medicare Other | Admitting: Cardiology

## 2014-02-24 ENCOUNTER — Encounter: Payer: Self-pay | Admitting: Cardiology

## 2014-02-24 VITALS — BP 130/58 | HR 84 | Ht 63.0 in | Wt 135.8 lb

## 2014-02-24 DIAGNOSIS — I2510 Atherosclerotic heart disease of native coronary artery without angina pectoris: Secondary | ICD-10-CM

## 2014-02-24 DIAGNOSIS — R0600 Dyspnea, unspecified: Secondary | ICD-10-CM

## 2014-02-24 DIAGNOSIS — R9430 Abnormal result of cardiovascular function study, unspecified: Secondary | ICD-10-CM

## 2014-02-24 DIAGNOSIS — R0989 Other specified symptoms and signs involving the circulatory and respiratory systems: Secondary | ICD-10-CM

## 2014-02-24 DIAGNOSIS — R0609 Other forms of dyspnea: Secondary | ICD-10-CM

## 2014-02-24 NOTE — Assessment & Plan Note (Signed)
.   EF remains normal when rechecked in January, 2015.

## 2014-02-24 NOTE — Progress Notes (Signed)
Patient ID: [name], female   DOB: 1941/08/29, 73 y.o.   MRN: [id number]    HPI  Patient is seen today for followup recent shortness of breath. When I saw her last she was improved. This was in February, 2015. Prior to that. She had significant symptoms related to a an increased level of Prograf. This improved and she is no better. I did assess her cardiac status and it was stable by echo. She is back today to be sure she remains stable and she's not having any significant shortness of breath.  Allergies  Allergen Reactions  . Asa Arthritis Strength-Antacid [Aspirin Buffered]   . Atorvastatin Other (See Comments)    Patient's skin was skin was sensitive  . Banana Nausea And Vomiting  . Lactose Intolerance (Gi) Other (See Comments)    Reaction unknown  . Lisinopril Cough    Current Outpatient Prescriptions  Medication Sig Dispense Refill  . amLODipine (NORVASC) 10 MG tablet Take 10 mg by mouth daily.      . Azelastine HCl 0.15 % SOLN Place 1 spray into the nose 2 (two) times daily.      . calcitRIOL (ROCALTROL) 0.25 MCG capsule Take 0.25 mcg by mouth 2 (two) times daily.       . cetirizine (ZYRTEC) 10 MG tablet Take 10 mg by mouth daily as needed. For allergies      . fluticasone (FLONASE) 50 MCG/ACT nasal spray Place 1 spray into the nose 2 (two) times daily.      Marland Kitchen glipiZIDE (GLUCOTROL) 5 MG tablet 1/2 tab daily      . mycophenolate (CELLCEPT) 250 MG capsule Take 750 mg by mouth 2 (two) times daily.       Marland Kitchen omeprazole (PRILOSEC) 20 MG capsule Take 40 mg by mouth daily.       . pravastatin (PRAVACHOL) 40 MG tablet Take 40 mg by mouth daily.      . predniSONE (DELTASONE) 5 MG tablet Take 5 mg by mouth daily.      Marland Kitchen senna-docusate (SENOKOT-S) 8.6-50 MG per tablet Take 1-2 tablets by mouth 2 (two) times daily as needed. For constipation       . tacrolimus (PROGRAF) 1 MG capsule Take 2-3 mg by mouth 2 (two) times daily. Take 2mg  in the am and 3mg  in pm q12hrs       No current  facility-administered medications for this visit.    History   Social History  . Marital Status: Divorced    Spouse Name: N/A    Number of Children: N/A  . Years of Education: N/A   Occupational History  . Not on file.   Social History Main Topics  . Smoking status: Former Smoker    Quit date: 11/21/1983  . Smokeless tobacco: Not on file  . Alcohol Use: No  . Drug Use: No  . Sexual Activity: Not on file   Other Topics Concern  . Not on file   Social History Narrative  . No narrative on file    Family History  Problem Relation Age of Onset  . Cancer Neg Hx     Past Medical History  Diagnosis Date  . 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 GI bleed 11/26/2010    January, 2012 , AVM  . Coronary artery disease     cath 11/2007, grafts patent /  Nuclear,  June, 2011, prior inferior MI with mild peri-infarct ischemia, anterior breast attenuation, EF 67%, done for renal transplant assessment / Low level exercise/Lexiscan Myoview (11/2013): EF 67%, no ischemi; normal study  . Diabetes mellitus     type 2  . Hyperlipidemia   . Hypertension   . Gout   . Osteoporosis   . Aspirin allergy     possible asa allergy - tolerates low-dose aspirin  . Hyperparathyroidism   . Hx of CABG     2003  . Ejection fraction     a. EF 60%, echo, January, 2009;  b.  EF 67%, nuclear, June, 2011;  c.  Echo (11/2013): EF 55-60%, normal wall motion, normal diastolic function, normal RV function  . S/P kidney transplant     September, 2012, [facility]  . H/O steroid therapy     Patient on prednisone after renal transplant.  . Kidney replaced by transplant 08/04/2011  . Preop cardiovascular exam     Surgical clearance for hip surgery December 18, 2012  . Myocardial infarction   . Asthma   . Pneumonia 08/2010    Hospitalization, October, 2011  . ESRD on dialysis     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, 2012  . GERD  (gastroesophageal reflux disease)   . Shortness of breath     while walking up stairs    Past Surgical History  Procedure Laterality Date  . Coronary artery bypass graft  2003  . Vesicovaginal fistula closure w/ tah  1984  . Stress cardiolite  04/01/2007  . Lower arterial  08/19/2002  . Nephrectomy transplanted organ   Right kidney  . Abdominal hysterectomy    . Breast surgery      RT BREAST CYST REMOVED   . Joint replacement  12-19-11    left hip  . Total hip arthroplasty  12/18/2012    Procedure: TOTAL HIP ARTHROPLASTY;  Surgeon: Ninetta Lights, MD;  Location: [facility];  Service: Orthopedics;  Laterality: Left;    Patient Active Problem List   Diagnosis Date Noted  . Anemia     Priority: High  . Aspirin allergy     Priority: High  . Ejection fraction     Priority: High  . Nausea & vomiting 12/22/2013  . Dyspnea 12/22/2013  . Diabetes 02/28/2013  . Preop cardiovascular exam   . Type II or unspecified type diabetes mellitus without mention of complication, not stated as uncontrolled 06/05/2012  . Encounter for long-term (current) use of other medications 02/26/2012  . Type II or unspecified type diabetes mellitus with renal manifestations, not stated as uncontrolled 02/26/2012  . S/P kidney transplant   . H/O steroid therapy   . Coronary artery disease   . Hyperlipidemia   . Hypertension   . Hx of CABG   . Anemia   . GI bleed 11/26/2010  . Pneumonia 08/20/2010  . FOOT PAIN 01/20/2010  . THROMBOCYTOPENIA 01/11/2009  . 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 GOUT 08/12/2007  . ASTHMA 08/12/2007  . MENOPAUSAL SYNDROME 08/12/2007  . OSTEOPOROSIS 08/12/2007  . VERTIGO 08/12/2007    ROS   Patient denies fever, chills, headache, sweats, rash, change in vision, change in hearing, chest pain, cough, nausea vomiting, urinary symptoms. All other systems are reviewed and are negative.  PHYSICAL EXAM  Patient is oriented to person time and place. Affect is normal. There is  no jugulovenous distention. Lungs are clear. Respiratory effort is nonlabored. Cardiac exam reveals S1 and S2. There no clicks or significant murmurs. Abdomen is  soft. Is no peripheral edema.  Filed Vitals:   02/24/14 0853  BP: 130/58  Pulse: 84  Height: 5\' 3"  (1.6 m)  Weight: 135 lb 12.8 oz (61.598 kg)  SpO2: 98%     ASSESSMENT & PLAN

## 2014-02-24 NOTE — Patient Instructions (Signed)
**Note De-identified  Obfuscation** Your physician recommends that you continue on your current medications as directed. Please refer to the Current Medication list given to you today.  Your physician wants you to follow-up in: 1 year. You will receive a reminder letter in the mail two months in advance. If you don't receive a letter, please call our office to schedule the follow-up appointment.  

## 2014-02-24 NOTE — Telephone Encounter (Signed)
Numbers that are listed on file are constantly busy. Unable to reach pt to schedule.

## 2014-02-24 NOTE — Assessment & Plan Note (Signed)
Her recent dyspnea appears to be related to her overall feeling poorly when her Prograf was elevated. This is now resolved. No further workup.

## 2014-02-26 ENCOUNTER — Ambulatory Visit (HOSPITAL_COMMUNITY)
Admission: RE | Admit: 2014-02-26 | Discharge: 2014-02-26 | Disposition: A | Discharge status: Home / Self Care | Payer: Medicare Other | Source: Ambulatory Visit | Attending: Endocrinology | Admitting: Endocrinology

## 2014-02-26 DIAGNOSIS — Z1231 Encounter for screening mammogram for malignant neoplasm of breast: Secondary | ICD-10-CM | POA: Insufficient documentation

## 2014-03-11 ENCOUNTER — Encounter: Payer: Self-pay | Admitting: Endocrinology

## 2014-04-10 ENCOUNTER — Other Ambulatory Visit: Payer: Self-pay | Admitting: Gastroenterology

## 2014-04-10 DIAGNOSIS — R1110 Vomiting, unspecified: Secondary | ICD-10-CM

## 2014-04-10 DIAGNOSIS — R634 Abnormal weight loss: Secondary | ICD-10-CM

## 2014-04-17 ENCOUNTER — Ambulatory Visit
Admission: RE | Admit: 2014-04-17 | Discharge: 2014-04-17 | Disposition: A | Discharge status: Home / Self Care | Payer: Medicare Other | Source: Ambulatory Visit | Attending: Gastroenterology | Admitting: Gastroenterology

## 2014-04-17 DIAGNOSIS — R1110 Vomiting, unspecified: Secondary | ICD-10-CM

## 2014-04-17 DIAGNOSIS — R634 Abnormal weight loss: Secondary | ICD-10-CM

## 2014-04-24 ENCOUNTER — Other Ambulatory Visit (HOSPITAL_COMMUNITY): Payer: Self-pay | Admitting: Gastroenterology

## 2014-04-24 DIAGNOSIS — R1110 Vomiting, unspecified: Secondary | ICD-10-CM

## 2014-05-08 ENCOUNTER — Encounter (HOSPITAL_COMMUNITY)
Admission: RE | Admit: 2014-05-08 | Discharge: 2014-05-08 | Disposition: A | Discharge status: Home / Self Care | Payer: Medicare Other | Source: Ambulatory Visit | Attending: Gastroenterology | Admitting: Gastroenterology

## 2014-05-08 DIAGNOSIS — R1110 Vomiting, unspecified: Secondary | ICD-10-CM | POA: Insufficient documentation

## 2014-05-08 MED ORDER — TECHNETIUM TC 99M SULFUR COLLOID
2.0000 | Freq: Once | INTRAVENOUS | Status: AC | PRN
  Administered 2014-05-08: 2 via INTRAVENOUS

## 2014-05-26 ENCOUNTER — Other Ambulatory Visit (HOSPITAL_COMMUNITY): Payer: Self-pay | Admitting: *Deleted

## 2014-05-27 ENCOUNTER — Ambulatory Visit (HOSPITAL_COMMUNITY)
Admission: RE | Admit: 2014-05-27 | Discharge: 2014-05-27 | Disposition: A | Discharge status: Home / Self Care | Payer: Medicare Other | Source: Ambulatory Visit | Attending: Nephrology | Admitting: Nephrology

## 2014-05-27 DIAGNOSIS — Z940 Kidney transplant status: Secondary | ICD-10-CM | POA: Insufficient documentation

## 2014-05-27 DIAGNOSIS — D649 Anemia, unspecified: Secondary | ICD-10-CM | POA: Insufficient documentation

## 2014-05-27 MED ORDER — SODIUM CHLORIDE 0.9 % IV SOLN
1020.0000 mg | Freq: Once | INTRAVENOUS | Status: AC
  Administered 2014-05-27: 1020 mg via INTRAVENOUS
  Filled 2014-05-27: qty 34

## 2014-06-22 ENCOUNTER — Encounter (HOSPITAL_COMMUNITY): Payer: Self-pay | Admitting: Pharmacy Technician

## 2014-07-01 ENCOUNTER — Encounter (HOSPITAL_COMMUNITY): Payer: Self-pay | Admitting: *Deleted

## 2014-07-13 ENCOUNTER — Other Ambulatory Visit: Payer: Self-pay | Admitting: Gastroenterology

## 2014-07-13 NOTE — Addendum Note (Signed)
Addended by: Wilford Corner on: 07/13/2014 04:55 PM   Modules accepted: Orders

## 2014-07-14 ENCOUNTER — Encounter (HOSPITAL_COMMUNITY)
Admission: RE | Disposition: A | Discharge status: Home / Self Care | Payer: Self-pay | Source: Ambulatory Visit | Attending: Gastroenterology

## 2014-07-14 ENCOUNTER — Ambulatory Visit (HOSPITAL_COMMUNITY)
Admission: RE | Admit: 2014-07-14 | Discharge: 2014-07-14 | Disposition: A | Discharge status: Home / Self Care | Payer: Medicare Other | Source: Ambulatory Visit | Attending: Gastroenterology | Admitting: Gastroenterology

## 2014-07-14 ENCOUNTER — Encounter (HOSPITAL_COMMUNITY): Payer: Medicare Other | Admitting: Certified Registered"

## 2014-07-14 ENCOUNTER — Ambulatory Visit (HOSPITAL_COMMUNITY): Payer: Medicare Other | Admitting: Certified Registered"

## 2014-07-14 ENCOUNTER — Encounter (HOSPITAL_COMMUNITY): Payer: Self-pay | Admitting: *Deleted

## 2014-07-14 DIAGNOSIS — Z87891 Personal history of nicotine dependence: Secondary | ICD-10-CM | POA: Diagnosis not present

## 2014-07-14 DIAGNOSIS — I252 Old myocardial infarction: Secondary | ICD-10-CM | POA: Diagnosis not present

## 2014-07-14 DIAGNOSIS — K219 Gastro-esophageal reflux disease without esophagitis: Secondary | ICD-10-CM | POA: Diagnosis not present

## 2014-07-14 DIAGNOSIS — J45909 Unspecified asthma, uncomplicated: Secondary | ICD-10-CM | POA: Insufficient documentation

## 2014-07-14 DIAGNOSIS — D126 Benign neoplasm of colon, unspecified: Secondary | ICD-10-CM

## 2014-07-14 DIAGNOSIS — Z951 Presence of aortocoronary bypass graft: Secondary | ICD-10-CM | POA: Insufficient documentation

## 2014-07-14 DIAGNOSIS — I10 Essential (primary) hypertension: Secondary | ICD-10-CM | POA: Insufficient documentation

## 2014-07-14 DIAGNOSIS — Z860100 Personal history of colon polyps, unspecified: Secondary | ICD-10-CM | POA: Insufficient documentation

## 2014-07-14 DIAGNOSIS — R42 Dizziness and giddiness: Secondary | ICD-10-CM | POA: Diagnosis not present

## 2014-07-14 DIAGNOSIS — Z940 Kidney transplant status: Secondary | ICD-10-CM | POA: Diagnosis not present

## 2014-07-14 DIAGNOSIS — K648 Other hemorrhoids: Secondary | ICD-10-CM | POA: Insufficient documentation

## 2014-07-14 DIAGNOSIS — E669 Obesity, unspecified: Secondary | ICD-10-CM | POA: Diagnosis not present

## 2014-07-14 DIAGNOSIS — I2510 Atherosclerotic heart disease of native coronary artery without angina pectoris: Secondary | ICD-10-CM | POA: Insufficient documentation

## 2014-07-14 HISTORY — PX: HOT HEMOSTASIS: SHX5433

## 2014-07-14 HISTORY — PX: COLONOSCOPY WITH PROPOFOL: SHX5780

## 2014-07-14 LAB — GLUCOSE, CAPILLARY: GLUCOSE-CAPILLARY: 87 mg/dL (ref 70–99)

## 2014-07-14 SURGERY — COLONOSCOPY WITH PROPOFOL
Anesthesia: Monitor Anesthesia Care

## 2014-07-14 MED ORDER — PROPOFOL 10 MG/ML IV BOLUS
INTRAVENOUS | Status: AC
  Filled 2014-07-14: qty 20

## 2014-07-14 MED ORDER — LACTATED RINGERS IV SOLN
INTRAVENOUS | Status: DC
  Administered 2014-07-14: 12:00:00 via INTRAVENOUS

## 2014-07-14 MED ORDER — SODIUM CHLORIDE 0.9 % IV SOLN: INTRAVENOUS | Status: DC

## 2014-07-14 MED ORDER — PROPOFOL 10 MG/ML IV BOLUS
INTRAVENOUS | Status: DC | PRN
  Administered 2014-07-14 (×2): 100 mg via INTRAVENOUS
  Administered 2014-07-14: 50 mg via INTRAVENOUS

## 2014-07-14 SURGICAL SUPPLY — 22 items

## 2014-07-14 NOTE — Anesthesia Postprocedure Evaluation (Signed)
  Anesthesia Post-op Note  Patient: [name]  Procedure(s) Performed: Procedure(s) (LRB): COLONOSCOPY WITH PROPOFOL (N/A) HOT HEMOSTASIS (ARGON PLASMA COAGULATION/BICAP) (N/A)  Patient Location: PACU  Anesthesia Type: MAC  Level of Consciousness: awake and alert   Airway and Oxygen Therapy: Patient Spontanous Breathing  Post-op Pain: mild  Post-op Assessment: Post-op Vital signs reviewed, Patient's Cardiovascular Status Stable, Respiratory Function Stable, Patent Airway and No signs of Nausea or vomiting  Last Vitals:  Filed Vitals:   07/14/14 1415  BP: 135/60  Pulse: 78  Temp:   Resp: 17    Post-op Vital Signs: stable   Complications: No apparent anesthesia complications

## 2014-07-14 NOTE — Transfer of Care (Signed)
Immediate Anesthesia Transfer of Care Note  Patient: [name]  Procedure(s) Performed: Procedure(s) (LRB): COLONOSCOPY WITH PROPOFOL (N/A) HOT HEMOSTASIS (ARGON PLASMA COAGULATION/BICAP) (N/A)  Patient Location: PACU  Anesthesia Type: MAC  Level of Consciousness: sedated, patient cooperative and responds to stimulation  Airway & Oxygen Therapy: Patient Spontanous Breathing and Patient connected to face mask oxgen  Post-op Assessment: Report given to PACU RN and Post -op Vital signs reviewed and stable  Post vital signs: Reviewed and stable  Complications: No apparent anesthesia complications

## 2014-07-14 NOTE — Discharge Instructions (Signed)
Colonoscopy, Care After Refer to this sheet in the next few weeks. These instructions provide you with information on caring for yourself after your procedure. Your health care provider may also give you more specific instructions. Your treatment has been planned according to current medical practices, but problems sometimes occur. Call your health care provider if you have any problems or questions after your procedure. WHAT TO EXPECT AFTER THE PROCEDURE  After your procedure, it is typical to have the following:  A small amount of blood in your stool.  Moderate amounts of gas and mild abdominal cramping or bloating. HOME CARE INSTRUCTIONS  Do not drive, operate machinery, or sign important documents for 24 hours.  You may shower and resume your regular physical activities, but move at a slower pace for the first 24 hours.  Take frequent rest periods for the first 24 hours.  Walk around or put a warm pack on your abdomen to help reduce abdominal cramping and bloating.  Drink enough fluids to keep your urine clear or pale yellow.  You may resume your normal diet as instructed by your health care provider. Avoid heavy or fried foods that are hard to digest.  Avoid drinking alcohol for 24 hours or as instructed by your health care provider.  Only take over-the-counter or prescription medicines as directed by your health care provider.  If a tissue sample (biopsy) was taken during your procedure:  Do not take aspirin or blood thinners for 7 days, or as instructed by your health care provider.  Do not drink alcohol for 7 days, or as instructed by your health care provider.  Eat soft foods for the first 24 hours. SEEK MEDICAL CARE IF: You have persistent spotting of blood in your stool 2-3 days after the procedure. SEEK IMMEDIATE MEDICAL CARE IF:  You have more than a small spotting of blood in your stool.  You pass large blood clots in your stool.  Your abdomen is swollen  (distended).  You have nausea or vomiting.  You have a fever.  You have increasing abdominal pain that is not relieved with medicine. Document Released: 06/20/2004 Document Revised: 08/27/2013 Document Reviewed: 07/14/2013 [facility] Patient Information 2015 [place], [place]. This information is not intended to replace advice given to you by your health care provider. Make sure you discuss any questions you have with your health care provider.  Avoid Aspirin and Ibuprofen products for 2 weeks.

## 2014-07-14 NOTE — Op Note (Signed)
[facility] [place], [address]   COLONOSCOPY PROCEDURE REPORT  PATIENT: [name], [name]  MR#: [id number] BIRTHDATE: 03-05-1941 , 73  yrs. old GENDER: Female ENDOSCOPIST: Wilford Corner, MD REFERRED BY: PROCEDURE DATE:  07/14/2014 PROCEDURE:   Colonoscopy with biopsy ASA CLASS:   Class III INDICATIONS:Patient's personal history of adenomatous colon polyps.  MEDICATIONS: See Anesthesia Report.  DESCRIPTION OF PROCEDURE:   After the risks benefits and alternatives of the procedure were thoroughly explained, informed consent was obtained.  The Pentax Ped Colon [id number]  endoscope was introduced through the anus and advanced to the cecum, which was identified by both the appendix and ileocecal valve , limited by unsatisfactory prep. No adverse events experienced.   The quality of the prep was inadequate. .  The instrument was then slowly withdrawn as the colon was fully examined.     FINDINGS:  Rectal exam unremarkable.  Pediatric colonoscope inserted into the colon and advanced to the cecum, where the appendiceal orifice and ileocecal valve were identified. A 5 mm flat polyp was biopsied in the cecum. The biopsy site and remaining polyp was fulgurated with argon plasma coagulation (APC). On careful withdrawal of the colonoscope evaluation was limited by stool products.   Retroflexion revealed small internal hemorrhoids.  COMPLICATIONS: None  IMPRESSION:     Cecal polyp - s/p bx and APC Internal hemorrhoids  RECOMMENDATIONS: Repeat colon for surveillance in 3-5 years. Await path for final determination of surveillance interval; Avoid Aspirin products for 2 weeks    ______________________________ eSigned:  Wilford Corner, MD 07/14/2014 2:06 PM   CC:

## 2014-07-14 NOTE — Anesthesia Preprocedure Evaluation (Addendum)
Anesthesia Evaluation  Patient identified by MRN, date of birth, ID band Patient awake    Reviewed: Allergy & Precautions, H&P , NPO status , Patient's Chart, lab work & pertinent test results  Airway Mallampati: I TM Distance: >3 FB Neck ROM: Full    Dental no notable dental hx. (+) Dental Advisory Given, Edentulous Upper, Edentulous Lower   Pulmonary shortness of breath and with exertion, asthma , pneumonia -, resolved, former smoker,  breath sounds clear to auscultation  Pulmonary exam normal       Cardiovascular Exercise Tolerance: Good hypertension, Pt. on medications + CAD, + Past MI and + CABG Rhythm:Regular Rate:Tachycardia  Recent cardiolite OK. Good LV fxn   Neuro/Psych vertigo negative neurological ROS  negative psych ROS   GI/Hepatic negative GI ROS, Neg liver ROS, GERD-  Medicated and Controlled,  Endo/Other  negative endocrine ROSdiabetes, Oral Hypoglycemic AgentsKidney transplant  Renal/GU Renal disease (Previous Renal Transplant 2012)negative Renal ROS  negative genitourinary   Musculoskeletal   Abdominal (+) + obese,   Peds  Hematology negative hematology ROS (+)   Anesthesia Other Findings   Reproductive/Obstetrics negative OB ROS                          Anesthesia Physical Anesthesia Plan  ASA: III  Anesthesia Plan: MAC   Post-op Pain Management:    Induction:   Airway Management Planned:   Additional Equipment:   Intra-op Plan:   Post-operative Plan:   Informed Consent: I have reviewed the patients History and Physical, chart, labs and discussed the procedure including the risks, benefits and alternatives for the proposed anesthesia with the patient or authorized representative who has indicated his/her understanding and acceptance.   Dental Advisory Given  Plan Discussed with: CRNA and Surgeon  Anesthesia Plan Comments:         Anesthesia Quick  Evaluation

## 2014-07-14 NOTE — H&P (Signed)
  Date of Initial H&P: 07/10/14  History reviewed, patient examined, no change in status, stable for surgery.

## 2014-07-15 ENCOUNTER — Encounter (HOSPITAL_COMMUNITY): Payer: Self-pay | Admitting: Gastroenterology

## 2015-02-05 ENCOUNTER — Other Ambulatory Visit: Payer: Self-pay | Admitting: Endocrinology

## 2015-02-05 DIAGNOSIS — Z1231 Encounter for screening mammogram for malignant neoplasm of breast: Secondary | ICD-10-CM

## 2015-02-26 ENCOUNTER — Ambulatory Visit (INDEPENDENT_AMBULATORY_CARE_PROVIDER_SITE_OTHER): Payer: Medicare Other | Admitting: Cardiology

## 2015-02-26 ENCOUNTER — Encounter: Payer: Self-pay | Admitting: Cardiology

## 2015-02-26 VITALS — BP 160/70 | HR 86 | Ht 63.0 in | Wt 148.6 lb

## 2015-02-26 DIAGNOSIS — R0600 Dyspnea, unspecified: Secondary | ICD-10-CM | POA: Diagnosis not present

## 2015-02-26 DIAGNOSIS — I2510 Atherosclerotic heart disease of native coronary artery without angina pectoris: Secondary | ICD-10-CM | POA: Diagnosis not present

## 2015-02-26 NOTE — Assessment & Plan Note (Signed)
She has some exertional shortness of breath. This is stable. No further wrkup is needed. Her overall cardiac status is stable.

## 2015-02-26 NOTE — Patient Instructions (Signed)
Your physician recommends that you continue on your current medications as directed. Please refer to the Current Medication list given to you today.  Your physician wants you to follow-up in: 1 year with one of our team members. You will receive a reminder letter in the mail two months in advance. If you don't receive a letter, please call our office to schedule the follow-up appointment.

## 2015-02-26 NOTE — Progress Notes (Signed)
Cardiology Office Note   Date:  02/26/2015   ID:  [name], DOB 1941/03/01, MRN [id number]  PCP:  Renato Shin, MD  Cardiologist:  Dola Argyle, MD   Chief Complaint  Patient presents with  . Appointment     follow-up shortness of breath      History of Present Illness: [name] is a 74 y.o. female who presents  Today to follow-up shortness of breath. I saw her last April, 2015. Before that she had had some difficulties with shortness of breath. We thought that some of her symptoms were related to increased levels of Prograf.  Fortunately she is doing well. She is not having any chest pain or significant shortness of breath.    Past Medical History  Diagnosis Date  . 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 GI bleed 11/26/2010    January, 2012 , AVM  . Coronary artery disease     cath 11/2007, grafts patent /  Nuclear, June, 2011, prior inferior MI with mild peri-infarct ischemia, anterior breast attenuation, EF 67%, done for renal transplant assessment / Low level exercise/Lexiscan Myoview (11/2013): EF 67%, no ischemi; normal study  . Diabetes mellitus     type 2  . Hyperlipidemia   . Hypertension   . Gout   . Osteoporosis   . Aspirin allergy     possible asa allergy - tolerates low-dose aspirin  . Hyperparathyroidism   . Hx of CABG     2003  . Ejection fraction     a. EF 60%, echo, January, 2009;  b.  EF 67%, nuclear, June, 2011;  c.  Echo (11/2013): EF 55-60%, normal wall motion, normal diastolic function, normal RV function  . S/P kidney transplant     September, 2012, [facility]  . H/O steroid therapy     Patient on prednisone after renal transplant.  . Kidney replaced by transplant 08/04/2011  . Preop cardiovascular exam     Surgical clearance for hip surgery December 18, 2012  . Myocardial infarction   . Asthma   . ESRD on dialysis     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, 2012  .  GERD (gastroesophageal reflux disease)   . Pneumonia 08/2010     Hospitalization, October, 2011    Past Surgical History  Procedure Laterality Date  . Coronary artery bypass graft  2003  . Vesicovaginal fistula closure w/ tah  1984  . Stress cardiolite  04/01/2007  . Lower arterial  08/19/2002  . Nephrectomy transplanted organ   Right kidney 2012  . Joint replacement  12-19-11    left hip  . Total hip arthroplasty  12/18/2012    Procedure: TOTAL HIP ARTHROPLASTY;  Surgeon: Ninetta Lights, MD;  Location: [facility];  Service: Orthopedics;  Laterality: Left;  . Coronary artery bypass graft  2001    X 4 DR Ron Parker  . Breast surgery  YRS AGO    RT BREAST CYST REMOVED   . Abdominal hysterectomy  1980'S  . Colonoscopy with propofol N/A 07/14/2014    Procedure: COLONOSCOPY WITH PROPOFOL;  Surgeon: Lear Ng, MD;  Location: [facility];  Service: Endoscopy;  Laterality: N/A;  . Hot hemostasis N/A 07/14/2014    Procedure: HOT HEMOSTASIS (ARGON PLASMA COAGULATION/BICAP);  Surgeon: Lear Ng, MD;  Location: [facility];  Service: Endoscopy;  Laterality: N/A;    Patient Active Problem  List   Diagnosis Date Noted  . Anemia     Priority: High  . Aspirin allergy     Priority: High  . Ejection fraction     Priority: High  . Benign neoplasm of colon 07/14/2014  . Nausea & vomiting 12/22/2013  . Dyspnea 12/22/2013  . Diabetes 02/28/2013  . Preop cardiovascular exam   . Type II or unspecified type diabetes mellitus without mention of complication, not stated as uncontrolled 06/05/2012  . Encounter for long-term (current) use of other medications 02/26/2012  . Type II or unspecified type diabetes mellitus with renal manifestations, not stated as uncontrolled 02/26/2012  . S/P kidney transplant   . H/O steroid therapy   . Coronary artery disease   . Hyperlipidemia   . Hypertension   . Hx of CABG   . Anemia   . GI bleed 11/26/2010  . Pneumonia 08/20/2010  . FOOT PAIN 01/20/2010   . THROMBOCYTOPENIA 01/11/2009  . 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 GOUT 08/12/2007  . ASTHMA 08/12/2007  . MENOPAUSAL SYNDROME 08/12/2007  . OSTEOPOROSIS 08/12/2007  . VERTIGO 08/12/2007      Current Outpatient Prescriptions  Medication Sig Dispense Refill  . amLODipine (NORVASC) 10 MG tablet Take 10 mg by mouth every morning.     Marland Kitchen aspirin EC 81 MG tablet Take 81 mg by mouth daily.    Marland Kitchen azaTHIOprine (IMURAN) 50 MG tablet Take 50 mg by mouth daily.    . Azelastine HCl 0.15 % SOLN Place 1 spray into the nose 2 (two) times daily.    . calcitRIOL (ROCALTROL) 0.25 MCG capsule Take 0.5-0.75 mcg by mouth daily.     . fluticasone (FLONASE) 50 MCG/ACT nasal spray Place 1 spray into both nostrils 2 (two) times daily.    . fluticasone (FLOVENT HFA) 110 MCG/ACT inhaler Inhale into the lungs as needed.    Marland Kitchen glipiZIDE (GLUCOTROL) 5 MG tablet Take 5 mg by mouth daily before breakfast.     . omeprazole (PRILOSEC) 20 MG capsule Take 20 mg by mouth daily.     . ondansetron (ZOFRAN) 4 MG tablet Take 4 mg by mouth 3 (three) times daily before meals.    . ondansetron (ZOFRAN-ODT) 4 MG disintegrating tablet Take 4 mg by mouth 3 (three) times daily. X 30 days started 06-24-14    . pravastatin (PRAVACHOL) 40 MG tablet Take 40 mg by mouth daily.    . predniSONE (DELTASONE) 5 MG tablet Take 5 mg by mouth daily.    Marland Kitchen senna-docusate (SENOKOT-S) 8.6-50 MG per tablet Take 1-2 tablets by mouth 2 (two) times daily as needed. For constipation      No current facility-administered medications for this visit.    Allergies:   Asa arthritis strength-antacid; Atorvastatin; Banana; Lactose intolerance (gi); and Lisinopril    Social History:  The patient  reports that she quit smoking about 31 years ago. Her smoking use included Cigarettes. She has a 8 pack-year smoking history. She has never used smokeless tobacco. She reports that she does not drink alcohol or use illicit drugs.   Family History:  The  patient's family history is negative for Cancer.    ROS:  Please see the history of present illness.      Patient denies fever, chills, headache, sweats, rash, change in vision, change in hearing, chest pain, cough, nausea or vomiting, urinary symptoms. All other systems are reviewed and are negative.   PHYSICAL EXAM: VS:  BP 160/70 mmHg  Pulse 86  Ht 5'  3" (1.6 m)  Wt 148 lb 9.6 oz (67.405 kg)  BMI 26.33 kg/m2 ,  patient is oriented to person time and place. Affect is normal. Head is atraumatic. Sclera and conjunctiva are normal. There is no jugulovenous distention. Lungs are clear. Respiratory effort is nonlabored. Cardiac exam reveals an S1 and S2. The abdomen is soft. There is no peripheral edema. There are no musculoskeletal deformities. There are no skin rashes.  EKG:    An EKG is done today and reviewed by me. There is no significant change.   Recent Labs: No results found for requested labs within last 365 days.    Lipid Panel    Component Value Date/Time   CHOL 200 06/21/2012 0819   TRIG 104.0 06/21/2012 0819   HDL 63.30 06/21/2012 0819   CHOLHDL 3 06/21/2012 0819   VLDL 20.8 06/21/2012 0819   LDLCALC 116* 06/21/2012 0819   LDLDIRECT 136.3 02/26/2012 1715      Wt Readings from Last 3 Encounters:  02/26/15 148 lb 9.6 oz (67.405 kg)  07/14/14 122 lb (55.339 kg)  05/27/14 125 lb (56.7 kg)      Current medicines are reviewed   Patient understands her medications well.     ASSESSMENT AND PLAN:

## 2015-02-26 NOTE — Assessment & Plan Note (Signed)
Coronary disease is stable. She had a nuclear stress study in 2015 with no significant abnormalities. No further workup is needed at this time.

## 2015-03-01 ENCOUNTER — Ambulatory Visit (HOSPITAL_COMMUNITY)
Admission: RE | Admit: 2015-03-01 | Discharge: 2015-03-01 | Disposition: A | Discharge status: Home / Self Care | Payer: Medicare Other | Source: Ambulatory Visit | Attending: Endocrinology | Admitting: Endocrinology

## 2015-03-01 DIAGNOSIS — Z1231 Encounter for screening mammogram for malignant neoplasm of breast: Secondary | ICD-10-CM | POA: Insufficient documentation

## 2015-03-28 ENCOUNTER — Emergency Department (HOSPITAL_COMMUNITY)
Admission: EM | Admit: 2015-03-28 | Discharge: 2015-03-28 | Disposition: A | Discharge status: Home / Self Care | Payer: Medicare Other | Attending: Emergency Medicine | Admitting: Emergency Medicine

## 2015-03-28 ENCOUNTER — Emergency Department (HOSPITAL_COMMUNITY): Payer: Medicare Other

## 2015-03-28 ENCOUNTER — Encounter (HOSPITAL_COMMUNITY): Payer: Self-pay | Admitting: *Deleted

## 2015-03-28 DIAGNOSIS — J45909 Unspecified asthma, uncomplicated: Secondary | ICD-10-CM | POA: Diagnosis not present

## 2015-03-28 DIAGNOSIS — Z79899 Other long term (current) drug therapy: Secondary | ICD-10-CM | POA: Diagnosis not present

## 2015-03-28 DIAGNOSIS — Z8701 Personal history of pneumonia (recurrent): Secondary | ICD-10-CM | POA: Insufficient documentation

## 2015-03-28 DIAGNOSIS — Z951 Presence of aortocoronary bypass graft: Secondary | ICD-10-CM | POA: Insufficient documentation

## 2015-03-28 DIAGNOSIS — Z87891 Personal history of nicotine dependence: Secondary | ICD-10-CM | POA: Insufficient documentation

## 2015-03-28 DIAGNOSIS — Z92241 Personal history of systemic steroid therapy: Secondary | ICD-10-CM | POA: Diagnosis not present

## 2015-03-28 DIAGNOSIS — M109 Gout, unspecified: Secondary | ICD-10-CM | POA: Insufficient documentation

## 2015-03-28 DIAGNOSIS — Z7982 Long term (current) use of aspirin: Secondary | ICD-10-CM | POA: Diagnosis not present

## 2015-03-28 DIAGNOSIS — Z7952 Long term (current) use of systemic steroids: Secondary | ICD-10-CM | POA: Diagnosis not present

## 2015-03-28 DIAGNOSIS — K219 Gastro-esophageal reflux disease without esophagitis: Secondary | ICD-10-CM | POA: Diagnosis not present

## 2015-03-28 DIAGNOSIS — R0781 Pleurodynia: Secondary | ICD-10-CM | POA: Diagnosis present

## 2015-03-28 DIAGNOSIS — Z7951 Long term (current) use of inhaled steroids: Secondary | ICD-10-CM | POA: Diagnosis not present

## 2015-03-28 DIAGNOSIS — I2510 Atherosclerotic heart disease of native coronary artery without angina pectoris: Secondary | ICD-10-CM | POA: Insufficient documentation

## 2015-03-28 DIAGNOSIS — Z992 Dependence on renal dialysis: Secondary | ICD-10-CM | POA: Insufficient documentation

## 2015-03-28 DIAGNOSIS — R0789 Other chest pain: Secondary | ICD-10-CM

## 2015-03-28 DIAGNOSIS — Z940 Kidney transplant status: Secondary | ICD-10-CM | POA: Insufficient documentation

## 2015-03-28 DIAGNOSIS — Z862 Personal history of diseases of the blood and blood-forming organs and certain disorders involving the immune mechanism: Secondary | ICD-10-CM | POA: Insufficient documentation

## 2015-03-28 DIAGNOSIS — E785 Hyperlipidemia, unspecified: Secondary | ICD-10-CM | POA: Insufficient documentation

## 2015-03-28 DIAGNOSIS — E119 Type 2 diabetes mellitus without complications: Secondary | ICD-10-CM | POA: Diagnosis not present

## 2015-03-28 DIAGNOSIS — I252 Old myocardial infarction: Secondary | ICD-10-CM | POA: Diagnosis not present

## 2015-03-28 DIAGNOSIS — I120 Hypertensive chronic kidney disease with stage 5 chronic kidney disease or end stage renal disease: Secondary | ICD-10-CM | POA: Diagnosis not present

## 2015-03-28 DIAGNOSIS — N186 End stage renal disease: Secondary | ICD-10-CM | POA: Insufficient documentation

## 2015-03-28 LAB — COMPREHENSIVE METABOLIC PANEL
ALBUMIN: 3.7 g/dL (ref 3.5–5.0)
ALT: 8 U/L — ABNORMAL LOW (ref 14–54)
ANION GAP: 10 (ref 5–15)
AST: 14 U/L — AB (ref 15–41)
Alkaline Phosphatase: 46 U/L (ref 38–126)
BUN: 19 mg/dL (ref 6–20)
CO2: 24 mmol/L (ref 22–32)
Calcium: 9.7 mg/dL (ref 8.9–10.3)
Chloride: 105 mmol/L (ref 101–111)
Creatinine, Ser: 1.24 mg/dL — ABNORMAL HIGH (ref 0.44–1.00)
GFR calc non Af Amer: 42 mL/min — ABNORMAL LOW (ref >60)
GFR, EST AFRICAN AMERICAN: 49 mL/min — AB (ref >60)
Glucose, Bld: 106 mg/dL — ABNORMAL HIGH (ref 70–99)
POTASSIUM: 3.6 mmol/L (ref 3.5–5.1)
Sodium: 139 mmol/L (ref 135–145)
TOTAL PROTEIN: 6.7 g/dL (ref 6.5–8.1)
Total Bilirubin: 0.5 mg/dL (ref 0.3–1.2)

## 2015-03-28 LAB — CBC WITH DIFFERENTIAL/PLATELET
Basophils Absolute: 0 10*3/uL (ref 0.0–0.1)
Basophils Relative: 0 % (ref 0–1)
EOS ABS: 0.1 10*3/uL (ref 0.0–0.7)
Eosinophils Relative: 2 % (ref 0–5)
HCT: 31.7 % — ABNORMAL LOW (ref 36.0–46.0)
Hemoglobin: 10.5 g/dL — ABNORMAL LOW (ref 12.0–15.0)
LYMPHS PCT: 25 % (ref 12–46)
Lymphs Abs: 1.7 10*3/uL (ref 0.7–4.0)
MCH: 21.5 pg — ABNORMAL LOW (ref 26.0–34.0)
MCHC: 33.1 g/dL (ref 30.0–36.0)
MCV: 64.8 fL — ABNORMAL LOW (ref 78.0–100.0)
Monocytes Absolute: 0.7 10*3/uL (ref 0.1–1.0)
Monocytes Relative: 10 % (ref 3–12)
NEUTROS PCT: 63 % (ref 43–77)
Neutro Abs: 4.2 10*3/uL (ref 1.7–7.7)
Platelets: 170 10*3/uL (ref 150–400)
RBC: 4.89 MIL/uL (ref 3.87–5.11)
RDW: 19.2 % — ABNORMAL HIGH (ref 11.5–15.5)
WBC: 6.7 10*3/uL (ref 4.0–10.5)

## 2015-03-28 LAB — TROPONIN I: Troponin I: <0.03 ng/mL (ref <0.031)

## 2015-03-28 MED ORDER — HYDROCODONE-ACETAMINOPHEN 5-325 MG PO TABS: 1.0000 | ORAL_TABLET | Freq: Four times a day (QID) | ORAL | Status: DC | PRN

## 2015-03-28 MED ORDER — MORPHINE SULFATE 4 MG/ML IJ SOLN
4.0000 mg | Freq: Once | INTRAMUSCULAR | Status: AC
  Administered 2015-03-28: 4 mg via INTRAVENOUS
  Filled 2015-03-28: qty 1

## 2015-03-28 NOTE — ED Notes (Signed)
Patient transported to X-ray 

## 2015-03-28 NOTE — Discharge Instructions (Signed)
Hydrocodone as prescribed as needed for pain.  Follow-up with your Dr. if not improving in the next few days, and return to the ER for symptoms significantly worsen or change.   Chest Wall Pain Chest wall pain is pain in or around the bones and muscles of your chest. It may take up to 6 weeks to get better. It may take longer if you must stay physically active in your work and activities.  CAUSES  Chest wall pain may happen on its own. However, it may be caused by:  A viral illness like the flu.  Injury.  Coughing.  Exercise.  Arthritis.  Fibromyalgia.  Shingles. HOME CARE INSTRUCTIONS   Avoid overtiring physical activity. Try not to strain or perform activities that cause pain. This includes any activities using your chest or your abdominal and side muscles, especially if heavy weights are used.  Put ice on the sore area.  Put ice in a plastic bag.  Place a towel between your skin and the bag.  Leave the ice on for 15-20 minutes per hour while awake for the first 2 days.  Only take over-the-counter or prescription medicines for pain, discomfort, or fever as directed by your caregiver. SEEK IMMEDIATE MEDICAL CARE IF:   Your pain increases, or you are very uncomfortable.  You have a fever.  Your chest pain becomes worse.  You have new, unexplained symptoms.  You have nausea or vomiting.  You feel sweaty or lightheaded.  You have a cough with phlegm (sputum), or you cough up blood. MAKE SURE YOU:   Understand these instructions.  Will watch your condition.  Will get help right away if you are not doing well or get worse. Document Released: 11/06/2005 Document Revised: 01/29/2012 Document Reviewed: 07/03/2011 [facility] Patient Information 2015 [place], [place]. This information is not intended to replace advice given to you by your health care provider. Make sure you discuss any questions you have with your health care provider.

## 2015-03-28 NOTE — ED Notes (Signed)
Patient presents with c/o left rib pain that travels up into her chest

## 2015-03-28 NOTE — ED Provider Notes (Signed)
CSN: [id number]     Arrival date & time 03/28/15  5681 History   First MD Initiated Contact with Patient 03/28/15 [phone]     Chief Complaint  Patient presents with  . Rib Pain      (Consider location/radiation/quality/duration/timing/severity/associated sxs/prior Treatment) HPI Comments: Patient is a 74 year old female with history of end-stage renal disease, status post renal transplant, coronary artery disease with CABG, diabetes. She presents with a several hour history of intermittent left-sided chest pain that woke her from sleep. She reports these pains as sharp and lasting for several seconds. It is located in her left rib cage and not associated with nausea, diaphoresis, or radiation to the arm or jaw. She denies any injury or trauma. She denies any fevers, chills, or productive cough.  The history is provided by the patient.    Past Medical History  Diagnosis Date  . 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 GI bleed 11/26/2010    January, 2012 , AVM  . Coronary artery disease     cath 11/2007, grafts patent /  Nuclear, June, 2011, prior inferior MI with mild peri-infarct ischemia, anterior breast attenuation, EF 67%, done for renal transplant assessment / Low level exercise/Lexiscan Myoview (11/2013): EF 67%, no ischemi; normal study  . Diabetes mellitus     type 2  . Hyperlipidemia   . Hypertension   . Gout   . Osteoporosis   . Aspirin allergy     possible asa allergy - tolerates low-dose aspirin  . Hyperparathyroidism   . Hx of CABG     2003  . Ejection fraction     a. EF 60%, echo, January, 2009;  b.  EF 67%, nuclear, June, 2011;  c.  Echo (11/2013): EF 55-60%, normal wall motion, normal diastolic function, normal RV function  . S/P kidney transplant     September, 2012, [facility]  . H/O steroid therapy     Patient on prednisone after renal transplant.  . Kidney replaced by transplant 08/04/2011   . Preop cardiovascular exam     Surgical clearance for hip surgery December 18, 2012  . Myocardial infarction   . Asthma   . ESRD on dialysis     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, 2012  . GERD (gastroesophageal reflux disease)   . Pneumonia 08/2010     Hospitalization, October, 2011   Past Surgical History  Procedure Laterality Date  . Coronary artery bypass graft  2003  . Vesicovaginal fistula closure w/ tah  1984  . Stress cardiolite  04/01/2007  . Lower arterial  08/19/2002  . Nephrectomy transplanted organ   Right kidney 2012  . Joint replacement  12-19-11    left hip  . Total hip arthroplasty  12/18/2012    Procedure: TOTAL HIP ARTHROPLASTY;  Surgeon: Ninetta Lights, MD;  Location: [place];  Service: Orthopedics;  Laterality: Left;  . Coronary artery bypass graft  2001    X 4 DR Ron Parker  . Breast surgery  YRS AGO    RT BREAST CYST REMOVED   . Abdominal hysterectomy  1980'S  . Colonoscopy with propofol N/A 07/14/2014    Procedure: COLONOSCOPY WITH PROPOFOL;  Surgeon: Lear Ng, MD;  Location: [facility];  Service: Endoscopy;  Laterality: N/A;  . Hot hemostasis N/A 07/14/2014    Procedure: HOT HEMOSTASIS (ARGON PLASMA COAGULATION/BICAP);  Surgeon: Lear Ng, MD;  Location:  [facility];  Service: Endoscopy;  Laterality: N/A;   Family History  Problem Relation Age of Onset  . Cancer Neg Hx    History  Substance Use Topics  . Smoking status: Former Smoker -- 1.00 packs/day for 8 years    Types: Cigarettes    Quit date: 11/21/1983  . Smokeless tobacco: Never Used  . Alcohol Use: No   OB History    No data available     Review of Systems  All other systems reviewed and are negative.     Allergies  Asa arthritis strength-antacid; Atorvastatin; Banana; Lactose intolerance (gi); and Lisinopril  Home Medications   Prior to Admission medications   Medication Sig Start Date End Date Taking? Authorizing Provider  amLODipine (NORVASC) 10 MG tablet Take 10 mg  by mouth every morning.     Historical Provider, MD  aspirin EC 81 MG tablet Take 81 mg by mouth daily.    Historical Provider, MD  azaTHIOprine (IMURAN) 50 MG tablet Take 50 mg by mouth daily. 06/23/14 06/23/15  Historical Provider, MD  Azelastine HCl 0.15 % SOLN Place 1 spray into the nose 2 (two) times daily. 03/13/12   Billy Fischer, MD  calcitRIOL (ROCALTROL) 0.25 MCG capsule Take 0.5-0.75 mcg by mouth daily.     Historical Provider, MD  fluticasone (FLONASE) 50 MCG/ACT nasal spray Place 1 spray into both nostrils 2 (two) times daily.    Historical Provider, MD  fluticasone (FLOVENT HFA) 110 MCG/ACT inhaler Inhale into the lungs as needed.    Historical Provider, MD  glipiZIDE (GLUCOTROL) 5 MG tablet Take 5 mg by mouth daily before breakfast.  01/25/12   Historical Provider, MD  omeprazole (PRILOSEC) 20 MG capsule Take 20 mg by mouth daily.     Historical Provider, MD  ondansetron (ZOFRAN) 4 MG tablet Take 4 mg by mouth 3 (three) times daily before meals.    Historical Provider, MD  ondansetron (ZOFRAN-ODT) 4 MG disintegrating tablet Take 4 mg by mouth 3 (three) times daily. X 30 days started 06-24-14    Historical Provider, MD  pravastatin (PRAVACHOL) 40 MG tablet Take 40 mg by mouth daily.    Historical Provider, MD  predniSONE (DELTASONE) 5 MG tablet Take 5 mg by mouth daily.    Historical Provider, MD  senna-docusate (SENOKOT-S) 8.6-50 MG per tablet Take 1-2 tablets by mouth 2 (two) times daily as needed. For constipation     Historical Provider, MD   BP 147/70 mmHg  Pulse 94  Temp(Src) 98.2 F (36.8 C) (Oral)  Resp 16  Ht 5\' 3"  (1.6 m)  Wt 146 lb (66.225 kg)  BMI 25.87 kg/m2  SpO2 99% Physical Exam  Constitutional: She is oriented to person, place, and time. She appears well-developed and well-nourished. No distress.  HENT:  Head: Normocephalic and atraumatic.  Neck: Normal range of motion. Neck supple.  Cardiovascular: Normal rate and regular rhythm.  Exam reveals no gallop and no  friction rub.   No murmur heard. Pulmonary/Chest: Effort normal and breath sounds normal. No respiratory distress. She has no wheezes. She has no rales. She exhibits no tenderness.  Abdominal: Soft. Bowel sounds are normal. She exhibits no distension. There is no tenderness.  Musculoskeletal: Normal range of motion. She exhibits no edema.  There is no calf tenderness or leg edema. Homans sign is absent bilaterally.  Neurological: She is alert and oriented to person, place, and time.  Skin: Skin is warm and dry. She is not diaphoretic.  Nursing note  and vitals reviewed.   ED Course  Procedures (including critical care time) Labs Review Labs Reviewed  COMPREHENSIVE METABOLIC PANEL  TROPONIN I  CBC WITH DIFFERENTIAL/PLATELET    Imaging Review No results found.  ED ECG REPORT   Date: 03/28/2015  Rate: 99  Rhythm: normal sinus rhythm  QRS Axis: normal  Intervals: normal  ST/T Wave abnormalities: normal  Conduction Disutrbances:none  Narrative Interpretation:   Old EKG Reviewed: none available  I have personally reviewed the EKG tracing and agree with the computerized printout as noted.   MDM   Final diagnoses:  None    Patient is a 74 year old female who presents with complaints of sharp pains to the left side of her chest that have been occurring intermittently all night long. She denies any injury or trauma. She denies any shortness of breath, diaphoresis, or nausea. Her pain is worse with palpation, movement, and is reproducible on exam.  Workup reveals an unremarkable EKG, normal chest and rib x-ray, negative troponin, and unremarkable laboratory studies. I highly doubt a cardiac etiology do not feel as though further workup is indicated into this. I also doubt PE as there is no tachycardia, hypoxia. She is feeling better with medications given in the ER and I believe is appropriate for discharge. I will prescribe her a pain medication and advised her to return to the ER  if her symptoms significantly worsen or change.    Veryl Speak, MD 03/28/15 [phone]

## 2015-05-04 LAB — HM DIABETES EYE EXAM

## 2015-05-05 ENCOUNTER — Encounter: Payer: Self-pay | Admitting: Endocrinology

## 2015-10-06 ENCOUNTER — Other Ambulatory Visit (HOSPITAL_COMMUNITY): Payer: Self-pay | Admitting: *Deleted

## 2015-10-07 ENCOUNTER — Ambulatory Visit (HOSPITAL_COMMUNITY)
Admission: RE | Admit: 2015-10-07 | Discharge: 2015-10-07 | Disposition: A | Discharge status: Home / Self Care | Payer: Medicare Other | Source: Ambulatory Visit | Attending: Nephrology | Admitting: Nephrology

## 2015-10-07 DIAGNOSIS — D509 Iron deficiency anemia, unspecified: Secondary | ICD-10-CM | POA: Insufficient documentation

## 2015-10-07 MED ORDER — SODIUM CHLORIDE 0.9 % IV SOLN
510.0000 mg | INTRAVENOUS | Status: DC
  Administered 2015-10-07: 510 mg via INTRAVENOUS
  Filled 2015-10-07: qty 17

## 2015-10-13 ENCOUNTER — Encounter (HOSPITAL_COMMUNITY)
Admission: RE | Admit: 2015-10-13 | Discharge: 2015-10-13 | Disposition: A | Discharge status: Home / Self Care | Payer: Medicare Other | Source: Ambulatory Visit | Attending: Nephrology | Admitting: Nephrology

## 2015-10-13 DIAGNOSIS — D509 Iron deficiency anemia, unspecified: Secondary | ICD-10-CM | POA: Insufficient documentation

## 2015-10-13 MED ORDER — SODIUM CHLORIDE 0.9 % IV SOLN
510.0000 mg | INTRAVENOUS | Status: DC
  Administered 2015-10-13: 510 mg via INTRAVENOUS
  Filled 2015-10-13: qty 17

## 2016-02-14 ENCOUNTER — Other Ambulatory Visit: Payer: Self-pay

## 2016-02-14 DIAGNOSIS — Z1231 Encounter for screening mammogram for malignant neoplasm of breast: Secondary | ICD-10-CM

## 2016-03-07 ENCOUNTER — Encounter: Payer: Self-pay | Admitting: Cardiovascular Disease

## 2016-03-07 ENCOUNTER — Ambulatory Visit (INDEPENDENT_AMBULATORY_CARE_PROVIDER_SITE_OTHER): Payer: Medicare Other | Admitting: Cardiovascular Disease

## 2016-03-07 VITALS — BP 142/78 | HR 78 | Ht 62.0 in | Wt 157.2 lb

## 2016-03-07 DIAGNOSIS — I2510 Atherosclerotic heart disease of native coronary artery without angina pectoris: Secondary | ICD-10-CM

## 2016-03-07 LAB — COMPREHENSIVE METABOLIC PANEL
ALBUMIN: 4.4 g/dL (ref 3.6–5.1)
ALK PHOS: 48 U/L (ref 33–130)
ALT: 7 U/L (ref 6–29)
AST: 17 U/L (ref 10–35)
BILIRUBIN TOTAL: 0.7 mg/dL (ref 0.2–1.2)
BUN: 24 mg/dL (ref 7–25)
CALCIUM: 9.5 mg/dL (ref 8.6–10.4)
CO2: 27 mmol/L (ref 20–31)
Chloride: 104 mmol/L (ref 98–110)
Creat: 1.56 mg/dL — ABNORMAL HIGH (ref 0.60–0.93)
Glucose, Bld: 94 mg/dL (ref 65–99)
Potassium: 3.7 mmol/L (ref 3.5–5.3)
Sodium: 141 mmol/L (ref 135–146)
TOTAL PROTEIN: 6.6 g/dL (ref 6.1–8.1)

## 2016-03-07 LAB — LIPID PANEL
CHOLESTEROL: 217 mg/dL — AB (ref 125–200)
HDL: 71 mg/dL (ref >=46)
LDL Cholesterol: 125 mg/dL (ref <130)
Total CHOL/HDL Ratio: 3.1 Ratio (ref <=5.0)
Triglycerides: 103 mg/dL (ref <150)
VLDL: 21 mg/dL (ref <30)

## 2016-03-07 MED ORDER — NITROGLYCERIN 0.4 MG SL SUBL: 0.4000 mg | SUBLINGUAL_TABLET | SUBLINGUAL | Status: DC | PRN

## 2016-03-07 NOTE — Patient Instructions (Signed)
Medication Instructions:  Your physician recommends that you continue on your current medications as directed. Please refer to the Current Medication list given to you today.   Labwork: TODAY - cholesterol, complete metabolic panel   Testing/Procedures: None Ordered   Follow-Up: Your physician wants you to follow-up in: 1 year with Dr. Nahser.  You will receive a reminder letter in the mail two months in advance. If you don't receive a letter, please call our office to schedule the follow-up appointment.   If you need a refill on your cardiac medications before your next appointment, please call your pharmacy.   Thank you for choosing [facility]! Michelle Swinyer, RN [phone]    

## 2016-03-07 NOTE — Progress Notes (Signed)
Cardiology Office Note   Date:  03/07/2016   ID:  [name], DOB 06/13/1941, MRN [id number]  PCP:  Renato Shin, MD  Cardiologist:  Thayer Headings, MD   Chief Complaint  Patient presents with  . Coronary Artery Disease   Problem list 1. CAD - CABG , 2001 2. ESRD - s/p renal transplant - Sept. 2012  3. GI bleed:  4. DM    History of Present Illness: [name] is a 75 y.o. female who presents  Today to follow-up shortness of breath. I saw her last April, 2015. Before that she had had some difficulties with shortness of breath. We thought that some of her symptoms were related to increased levels of Prograf.  Fortunately she is doing well. She is not having any chest pain or significant shortness of breath.  March 07, 2016:  Doing well.  Previous patient of Dr. Ron Parker. No issues.   Some DOE if she walks too far.  No Cp , has not had to take any NTG .     Past Medical History  Diagnosis Date  . 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 GI bleed 11/26/2010    January, 2012 , AVM  . Coronary artery disease     cath 11/2007, grafts patent /  Nuclear, June, 2011, prior inferior MI with mild peri-infarct ischemia, anterior breast attenuation, EF 67%, done for renal transplant assessment / Low level exercise/Lexiscan Myoview (11/2013): EF 67%, no ischemi; normal study  . Diabetes mellitus     type 2  . Hyperlipidemia   . Hypertension   . Gout   . Osteoporosis   . Aspirin allergy     possible asa allergy - tolerates low-dose aspirin  . Hyperparathyroidism   . Hx of CABG     2003  . Ejection fraction     a. EF 60%, echo, January, 2009;  b.  EF 67%, nuclear, June, 2011;  c.  Echo (11/2013): EF 55-60%, normal wall motion, normal diastolic function, normal RV function  . S/P kidney transplant     September, 2012, [facility]  . H/O steroid therapy     Patient on prednisone after renal  transplant.  . Kidney replaced by transplant 08/04/2011  . Preop cardiovascular exam     Surgical clearance for hip surgery December 18, 2012  . Myocardial infarction ([place])   . Asthma   . ESRD on dialysis [facility])     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, 2012  . GERD (gastroesophageal reflux disease)   . Pneumonia 08/2010     Hospitalization, October, 2011    Past Surgical History  Procedure Laterality Date  . Coronary artery bypass graft  2003  . Vesicovaginal fistula closure w/ tah  1984  . Stress cardiolite  04/01/2007  . Lower arterial  08/19/2002  . Nephrectomy transplanted organ   Right kidney 2012  . Joint replacement  12-19-11    left hip  . Total hip arthroplasty  12/18/2012    Procedure: TOTAL HIP ARTHROPLASTY;  Surgeon: Ninetta Lights, MD;  Location: [place];  Service: Orthopedics;  Laterality: Left;  . Coronary artery bypass graft  2001    X 4 DR Ron Parker  . Breast surgery  YRS AGO    RT BREAST CYST REMOVED   . Abdominal hysterectomy  1980'S  . Colonoscopy with propofol N/A 07/14/2014  Procedure: COLONOSCOPY WITH PROPOFOL;  Surgeon: Lear Ng, MD;  [facility];  Service: Endoscopy;  Laterality: N/A;  . Hot hemostasis N/A 07/14/2014    Procedure: HOT HEMOSTASIS (ARGON PLASMA COAGULATION/BICAP);  Surgeon: Lear Ng, MD;  Location: [facility];  Service: Endoscopy;  Laterality: N/A;    Patient Active Problem List   Diagnosis Date Noted  . Benign neoplasm of colon 07/14/2014  . Nausea & vomiting 12/22/2013  . Dyspnea 12/22/2013  . Diabetes ([place]) 02/28/2013  . Preop cardiovascular exam   . Type II or unspecified type diabetes mellitus without mention of complication, not stated as uncontrolled 06/05/2012  . Encounter for long-term (current) use of other medications 02/26/2012  . Type II or unspecified type diabetes mellitus with renal manifestations, not stated as uncontrolled 02/26/2012  . S/P kidney transplant   . H/O steroid therapy   . Anemia   .  Coronary artery disease   . Hyperlipidemia   . Hypertension   . Aspirin allergy   . Hx of CABG   . Ejection fraction   . Anemia   . GI bleed 11/26/2010  . Pneumonia 08/20/2010  . FOOT PAIN 01/20/2010  . THROMBOCYTOPENIA 01/11/2009  . 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 GOUT 08/12/2007  . ASTHMA 08/12/2007  . MENOPAUSAL SYNDROME 08/12/2007  . OSTEOPOROSIS 08/12/2007  . VERTIGO 08/12/2007      Current Outpatient Prescriptions  Medication Sig Dispense Refill  . amLODipine (NORVASC) 10 MG tablet Take 10 mg by mouth every morning.     Marland Kitchen aspirin EC 81 MG tablet Take 81 mg by mouth daily.    . Azelastine HCl 0.15 % SOLN Place 1 spray into the nose 2 (two) times daily.    . calcitRIOL (ROCALTROL) 0.25 MCG capsule Take 0.5-0.75 mcg by mouth See admin instructions. Alternate 2 tablets one day, 3 tablets the next day.    . Cholecalciferol (VITAMIN D3) 5000 UNITS TABS Take 1 tablet by mouth daily.    . fluticasone (FLONASE) 50 MCG/ACT nasal spray Place 1 spray into both nostrils 2 (two) times daily.    Marland Kitchen glipiZIDE (GLUCOTROL) 5 MG tablet Take 5 mg by mouth daily before breakfast.     . omeprazole (PRILOSEC) 20 MG capsule Take 20 mg by mouth daily.     . pravastatin (PRAVACHOL) 40 MG tablet Take 40 mg by mouth at bedtime.     . predniSONE (DELTASONE) 5 MG tablet Take 5 mg by mouth daily. Reported on 03/07/2016    . senna-docusate (SENOKOT-S) 8.6-50 MG per tablet Take 1-2 tablets by mouth 2 (two) times daily as needed. For constipation     . tacrolimus (PROGRAF) 1 MG capsule Take 6 mg by mouth every 12 (twelve) hours.    Marland Kitchen HYDROcodone-acetaminophen (NORCO) 5-325 MG per tablet Take 1-2 tablets by mouth every 6 (six) hours as needed. (Patient not taking: Reported on 03/07/2016) 15 tablet 0   No current facility-administered medications for this visit.    Allergies:   Lactose; Asa arthritis strength-antacid; Aspirin; Atorvastatin; Banana; Lactose intolerance (gi); and Lisinopril     Social History:  The patient  reports that she quit smoking about 32 years ago. Her smoking use included Cigarettes. She has a 8 pack-year smoking history. She has never used smokeless tobacco. She reports that she does not drink alcohol or use illicit drugs.   Family History:  The patient's family history is negative for Cancer.    ROS:  Please see the history of present illness.  Patient denies fever, chills, headache, sweats, rash, change in vision, change in hearing, chest pain, cough, nausea or vomiting, urinary symptoms. All other systems are reviewed and are negative.   PHYSICAL EXAM: VS:  BP 142/78 mmHg  Pulse 78  Ht 5\' 2"  (1.575 m)  Wt 157 lb 3.2 oz (71.305 kg)  BMI 28.74 kg/m2 ,  patient is oriented to person time and place. Affect is normal. Head is atraumatic. Sclera and conjunctiva are normal. There is no jugulovenous distention. Lungs are clear. Respiratory effort is nonlabored. Cardiac exam reveals an S1 and S2. The abdomen is soft. There is no peripheral edema. There are no musculoskeletal deformities. There are no skin rashes.  EKG:    An EKG is done today and reviewed by me.   NSR at 78.  Normal ECG   Recent Labs: 03/28/2015: ALT 8*; BUN 19; Creatinine, Ser 1.24*; Hemoglobin 10.5*; Platelets 170; Potassium 3.6; Sodium 139    Lipid Panel    Component Value Date/Time   CHOL 200 06/21/2012 0819   TRIG 104.0 06/21/2012 0819   HDL 63.30 06/21/2012 0819   CHOLHDL 3 06/21/2012 0819   VLDL 20.8 06/21/2012 0819   LDLCALC 116* 06/21/2012 0819   LDLDIRECT 136.3 02/26/2012 1715      Wt Readings from Last 3 Encounters:  03/07/16 157 lb 3.2 oz (71.305 kg)  10/13/15 150 lb (68.04 kg)  03/28/15 146 lb (66.225 kg)      Current medicines are reviewed   Patient understands her medications well.     ASSESSMENT AND PLAN:  1. CAD - CABG , 2001 - seems to be stable,   No angina  2. ESRD - s/p renal transplant - Sept. 2012  - managed by Dr. Mercy Moore and at [facility]    3. GI bleed:   4. DM     Nahser, Wonda Cheng, MD  03/07/2016 5:20 PM    [place],  [place], [place]  [address] Pager [phone] Phone: [phone]; Fax: [phone]

## 2016-03-08 ENCOUNTER — Telehealth: Payer: Self-pay | Admitting: Nurse Practitioner

## 2016-03-08 DIAGNOSIS — E785 Hyperlipidemia, unspecified: Secondary | ICD-10-CM

## 2016-03-08 MED ORDER — ROSUVASTATIN CALCIUM 5 MG PO TABS: 5.0000 mg | ORAL_TABLET | Freq: Every day | ORAL | Status: DC

## 2016-03-08 NOTE — Telephone Encounter (Signed)
-----   Message from Thayer Headings, MD sent at 03/07/2016  5:25 PM EDT ----- Lipids are mildly elevated. intolerent to Atorvastatin  Lets see if she will tolerate Rosuvastatin 5 mg a day .  Check lipids , liver , BMP  In 3 months

## 2016-03-08 NOTE — Telephone Encounter (Signed)
Reviewed results and plan of care with patient who verbalized understanding and agreement to d/c pravastatin and start rosuvastatin 5 mg daily.  I scheduled her for repeat fasting labs July 25.

## 2016-03-09 ENCOUNTER — Telehealth: Payer: Self-pay | Admitting: *Deleted

## 2016-03-09 MED ORDER — ROSUVASTATIN CALCIUM 10 MG PO TABS: ORAL_TABLET | ORAL | Status: DC

## 2016-03-09 NOTE — Telephone Encounter (Signed)
Spoke with patient and discussed options for lowering cholesterol.   She cannot take atorvastatin due to severe joint pain and black spots on skin.  She is willing to try rosuvastatin but cannot pick up the Rx until next week due to cost.  I advised her that Dr. Acie Fredrickson would like to prescribe 10 mg tablets and have her take 1/2 tab 2-3 days per week.  She verbalized understanding and agreement with plan and thanked me for the call.

## 2016-03-09 NOTE — Telephone Encounter (Signed)
Patient states that she is unable to pick up the rosuvastatin rx as it is too expensive at $45.00. She would like to be switched to something more affordable other that atorvastatin as she is allergic to that. Please advise. Thanks, MI

## 2016-03-27 ENCOUNTER — Ambulatory Visit
Admission: RE | Admit: 2016-03-27 | Discharge: 2016-03-27 | Disposition: A | Discharge status: Home / Self Care | Payer: Medicare Other | Source: Ambulatory Visit

## 2016-03-27 DIAGNOSIS — Z1231 Encounter for screening mammogram for malignant neoplasm of breast: Secondary | ICD-10-CM

## 2016-04-02 ENCOUNTER — Encounter (HOSPITAL_COMMUNITY): Payer: Self-pay | Admitting: *Deleted

## 2016-04-02 ENCOUNTER — Emergency Department (HOSPITAL_COMMUNITY): Payer: Medicare Other

## 2016-04-02 ENCOUNTER — Emergency Department (HOSPITAL_COMMUNITY)
Admission: EM | Admit: 2016-04-02 | Discharge: 2016-04-02 | Disposition: A | Discharge status: Home / Self Care | Payer: Medicare Other | Attending: Emergency Medicine | Admitting: Emergency Medicine

## 2016-04-02 DIAGNOSIS — Z940 Kidney transplant status: Secondary | ICD-10-CM | POA: Diagnosis not present

## 2016-04-02 DIAGNOSIS — I2510 Atherosclerotic heart disease of native coronary artery without angina pectoris: Secondary | ICD-10-CM | POA: Insufficient documentation

## 2016-04-02 DIAGNOSIS — Z7982 Long term (current) use of aspirin: Secondary | ICD-10-CM | POA: Insufficient documentation

## 2016-04-02 DIAGNOSIS — W101XXA Fall (on)(from) sidewalk curb, initial encounter: Secondary | ICD-10-CM | POA: Insufficient documentation

## 2016-04-02 DIAGNOSIS — J45909 Unspecified asthma, uncomplicated: Secondary | ICD-10-CM | POA: Diagnosis not present

## 2016-04-02 DIAGNOSIS — I120 Hypertensive chronic kidney disease with stage 5 chronic kidney disease or end stage renal disease: Secondary | ICD-10-CM | POA: Insufficient documentation

## 2016-04-02 DIAGNOSIS — Z79899 Other long term (current) drug therapy: Secondary | ICD-10-CM | POA: Insufficient documentation

## 2016-04-02 DIAGNOSIS — Y9389 Activity, other specified: Secondary | ICD-10-CM | POA: Diagnosis not present

## 2016-04-02 DIAGNOSIS — M109 Gout, unspecified: Secondary | ICD-10-CM | POA: Insufficient documentation

## 2016-04-02 DIAGNOSIS — Z8701 Personal history of pneumonia (recurrent): Secondary | ICD-10-CM | POA: Insufficient documentation

## 2016-04-02 DIAGNOSIS — Z7984 Long term (current) use of oral hypoglycemic drugs: Secondary | ICD-10-CM | POA: Diagnosis not present

## 2016-04-02 DIAGNOSIS — E213 Hyperparathyroidism, unspecified: Secondary | ICD-10-CM | POA: Insufficient documentation

## 2016-04-02 DIAGNOSIS — S82002A Unspecified fracture of left patella, initial encounter for closed fracture: Secondary | ICD-10-CM

## 2016-04-02 DIAGNOSIS — I252 Old myocardial infarction: Secondary | ICD-10-CM | POA: Insufficient documentation

## 2016-04-02 DIAGNOSIS — Y998 Other external cause status: Secondary | ICD-10-CM | POA: Insufficient documentation

## 2016-04-02 DIAGNOSIS — K219 Gastro-esophageal reflux disease without esophagitis: Secondary | ICD-10-CM | POA: Insufficient documentation

## 2016-04-02 DIAGNOSIS — Z7952 Long term (current) use of systemic steroids: Secondary | ICD-10-CM | POA: Insufficient documentation

## 2016-04-02 DIAGNOSIS — S82032A Displaced transverse fracture of left patella, initial encounter for closed fracture: Secondary | ICD-10-CM | POA: Insufficient documentation

## 2016-04-02 DIAGNOSIS — Y92480 Sidewalk as the place of occurrence of the external cause: Secondary | ICD-10-CM | POA: Insufficient documentation

## 2016-04-02 DIAGNOSIS — W101XXS Fall (on)(from) sidewalk curb, sequela: Secondary | ICD-10-CM

## 2016-04-02 DIAGNOSIS — Z7951 Long term (current) use of inhaled steroids: Secondary | ICD-10-CM | POA: Insufficient documentation

## 2016-04-02 DIAGNOSIS — E119 Type 2 diabetes mellitus without complications: Secondary | ICD-10-CM | POA: Diagnosis not present

## 2016-04-02 DIAGNOSIS — Z951 Presence of aortocoronary bypass graft: Secondary | ICD-10-CM | POA: Insufficient documentation

## 2016-04-02 DIAGNOSIS — N186 End stage renal disease: Secondary | ICD-10-CM | POA: Insufficient documentation

## 2016-04-02 DIAGNOSIS — Z862 Personal history of diseases of the blood and blood-forming organs and certain disorders involving the immune mechanism: Secondary | ICD-10-CM | POA: Insufficient documentation

## 2016-04-02 DIAGNOSIS — M810 Age-related osteoporosis without current pathological fracture: Secondary | ICD-10-CM | POA: Diagnosis not present

## 2016-04-02 DIAGNOSIS — S8992XA Unspecified injury of left lower leg, initial encounter: Secondary | ICD-10-CM | POA: Diagnosis present

## 2016-04-02 DIAGNOSIS — Z87891 Personal history of nicotine dependence: Secondary | ICD-10-CM | POA: Diagnosis not present

## 2016-04-02 DIAGNOSIS — E785 Hyperlipidemia, unspecified: Secondary | ICD-10-CM | POA: Insufficient documentation

## 2016-04-02 MED ORDER — HYDROCODONE-ACETAMINOPHEN 5-325 MG PO TABS: 1.0000 | ORAL_TABLET | Freq: Four times a day (QID) | ORAL | Status: DC | PRN

## 2016-04-02 MED ORDER — TRAMADOL HCL 50 MG PO TABS
50.0000 mg | ORAL_TABLET | Freq: Once | ORAL | Status: AC
  Administered 2016-04-02: 50 mg via ORAL
  Filled 2016-04-02: qty 1

## 2016-04-02 MED ORDER — ACETAMINOPHEN 325 MG PO TABS
650.0000 mg | ORAL_TABLET | Freq: Once | ORAL | Status: AC
  Administered 2016-04-02: 650 mg via ORAL
  Filled 2016-04-02: qty 2

## 2016-04-02 NOTE — Progress Notes (Signed)
Orthopedic Tech Progress Note Patient Details:  [name] 11-Dec-1940 [id number]  Ortho Devices Type of Ortho Device: Knee Immobilizer Ortho Device/Splint Location: LLE Ortho Device/Splint Interventions: Ordered, Application   Braulio Bosch 04/02/2016, 4:35 PM

## 2016-04-02 NOTE — ED Provider Notes (Signed)
CSN: [id number]     Arrival date & time 04/02/16  1413 History  By signing my name below, I, Rowan Blase, attest that this documentation has been prepared under the direction and in the presence of non-physician practitioner, Waynetta Pean, PA-C. Electronically Signed: Rowan Blase, Scribe. 04/02/2016. 3:37 PM.   Chief Complaint  Patient presents with  . Knee Injury   The history is provided by the patient. No language interpreter was used.   HPI Comments:  MAGGI GRAPER is a 75 y.o. female with PMHx of osteoporosis who presents to the Emergency Department complaining of sudden onset left knee pain and swelling s/p fall earlier today. Pt was getting out of her car when she tripped, falling to her left knee and then to her hands. She states she cannot move her left leg secondary to pain at her left knee.  Pt applied a knee sleeve and ice pack PTA; no medications taken PTA. Denies head injury, LOC, abdominal pain, chest pain, shortness of breath, lightheadedness, dizziness, numbness or tingling, weakness, back pain or chest pain.  Past Medical History  Diagnosis Date  . 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 GI bleed 11/26/2010    January, 2012 , AVM  . Coronary artery disease     cath 11/2007, grafts patent /  Nuclear, June, 2011, prior inferior MI with mild peri-infarct ischemia, anterior breast attenuation, EF 67%, done for renal transplant assessment / Low level exercise/Lexiscan Myoview (11/2013): EF 67%, no ischemi; normal study  . Diabetes mellitus     type 2  . Hyperlipidemia   . Hypertension   . Gout   . Osteoporosis   . Aspirin allergy     possible asa allergy - tolerates low-dose aspirin  . Hyperparathyroidism   . Hx of CABG     2003  . Ejection fraction     a. EF 60%, echo, January, 2009;  b.  EF 67%, nuclear, June, 2011;  c.  Echo (11/2013): EF 55-60%, normal wall motion, normal  diastolic function, normal RV function  . S/P kidney transplant     September, 2012, [facility]  . H/O steroid therapy     Patient on prednisone after renal transplant.  . Kidney replaced by transplant 08/04/2011  . Preop cardiovascular exam     Surgical clearance for hip surgery December 18, 2012  . Myocardial infarction ([place])   . Asthma   . ESRD on dialysis [facility])     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, 2012  . GERD (gastroesophageal reflux disease)   . Pneumonia 08/2010     Hospitalization, October, 2011   Past Surgical History  Procedure Laterality Date  . Coronary artery bypass graft  2003  . Vesicovaginal fistula closure w/ tah  1984  . Stress cardiolite  04/01/2007  . Lower arterial  08/19/2002  . Nephrectomy transplanted organ   Right kidney 2012  . Joint replacement  12-19-11    left hip  . Total hip arthroplasty  12/18/2012    Procedure: TOTAL HIP ARTHROPLASTY;  Surgeon: Ninetta Lights, MD;  Location: [facility];  Service: Orthopedics;  Laterality: Left;  . Coronary artery bypass graft  2001    X 4 DR Ron Parker  . Breast surgery  YRS AGO    RT BREAST CYST REMOVED   . Abdominal hysterectomy  1980'S  . Colonoscopy with propofol N/A 07/14/2014  Procedure: COLONOSCOPY WITH PROPOFOL;  Surgeon: Lear Ng, MD;  Location: [facility];  Service: Endoscopy;  Laterality: N/A;  . Hot hemostasis N/A 07/14/2014    Procedure: HOT HEMOSTASIS (ARGON PLASMA COAGULATION/BICAP);  Surgeon: Lear Ng, MD;  Location: [facility];  Service: Endoscopy;  Laterality: N/A;   Family History  Problem Relation Age of Onset  . Cancer Neg Hx    Social History  Substance Use Topics  . Smoking status: Former Smoker -- 1.00 packs/day for 8 years    Types: Cigarettes    Quit date: 11/21/1983  . Smokeless tobacco: Never Used  . Alcohol Use: No   OB History    No data available     Review of Systems  Constitutional: Negative for fever.  HENT: Negative for ear discharge and ear pain.   Eyes:  Negative for visual disturbance.  Respiratory: Negative for shortness of breath.   Cardiovascular: Negative for chest pain.  Gastrointestinal: Negative for nausea, vomiting and abdominal pain.  Musculoskeletal: Positive for arthralgias. Negative for back pain.  Skin: Negative for wound.  Neurological: Negative for dizziness, syncope, weakness, light-headedness, numbness and headaches.   Allergies  Lactose; Asa arthritis strength-antacid; Aspirin; Atorvastatin; Banana; Lactose intolerance (gi); and Lisinopril  Home Medications   Prior to Admission medications   Medication Sig Start Date End Date Taking? Authorizing Provider  amLODipine (NORVASC) 10 MG tablet Take 10 mg by mouth every morning.     Historical Provider, MD  aspirin EC 81 MG tablet Take 81 mg by mouth daily.    Historical Provider, MD  Azelastine HCl 0.15 % SOLN Place 1 spray into the nose 2 (two) times daily. 03/13/12   Billy Fischer, MD  calcitRIOL (ROCALTROL) 0.25 MCG capsule Take 0.5-0.75 mcg by mouth See admin instructions. Alternate 2 tablets one day, 3 tablets the next day.    Historical Provider, MD  Cholecalciferol (VITAMIN D3) 5000 UNITS TABS Take 1 tablet by mouth daily.    Historical Provider, MD  fluticasone (FLONASE) 50 MCG/ACT nasal spray Place 1 spray into both nostrils 2 (two) times daily.    Historical Provider, MD  glipiZIDE (GLUCOTROL) 5 MG tablet Take 5 mg by mouth daily before breakfast.  01/25/12   Historical Provider, MD  HYDROcodone-acetaminophen (NORCO) 5-325 MG per tablet Take 1-2 tablets by mouth every 6 (six) hours as needed. Patient not taking: Reported on 03/07/2016 03/28/15   Veryl Speak, MD  nitroGLYCERIN (NITROSTAT) 0.4 MG SL tablet Place 1 tablet (0.4 mg total) under the tongue every 5 (five) minutes as needed for chest pain. 03/07/16   Thayer Headings, MD  omeprazole (PRILOSEC) 20 MG capsule Take 20 mg by mouth daily.     Historical Provider, MD  predniSONE (DELTASONE) 5 MG tablet Take 5 mg by mouth  daily. Reported on 03/07/2016    Historical Provider, MD  rosuvastatin (CRESTOR) 10 MG tablet Take 1/2 pill 3 times per week 03/09/16   Thayer Headings, MD  senna-docusate (SENOKOT-S) 8.6-50 MG per tablet Take 1-2 tablets by mouth 2 (two) times daily as needed. For constipation     Historical Provider, MD  tacrolimus (PROGRAF) 1 MG capsule Take 6 mg by mouth every 12 (twelve) hours.    Historical Provider, MD   BP 148/72 mmHg  Pulse 90  Temp(Src) 98.7 F (37.1 C) (Oral)  Resp 20  SpO2 96% Physical Exam  Constitutional: She is oriented to person, place, and time. She appears well-developed and well-nourished. No distress.  Nontoxic appearing.  HENT:  Head: Normocephalic and atraumatic.  Right Ear: External ear normal.  Left Ear: External ear normal.  No visible signs of head or face trauma  Eyes: Conjunctivae are normal. Pupils are equal, round, and reactive to light. Right eye exhibits no discharge. Left eye exhibits no discharge.  Neck: Neck supple.  Cardiovascular: Normal rate, regular rhythm and intact distal pulses.   Pulses:      Dorsalis pedis pulses are 2+ on the right side, and 2+ on the left side.       Posterior tibial pulses are 2+ on the right side, and 2+ on the left side.  Good capillary refill. Bilateral radial, posterior tibialis and dorsalis pedis pulses are intact.    Pulmonary/Chest: Effort normal. No respiratory distress.  Abdominal: Soft. There is no tenderness.  Musculoskeletal: She exhibits edema and tenderness.  Edema to anterior aspect of left knee. No left knee deformity.  no calf tenderness; no hip tenderness; no thigh tenderness.   Neurological: She is alert and oriented to person, place, and time. Coordination normal.  Sensation intact to her bilateral lower extremities.  Skin: Skin is warm and dry. No rash noted. She is not diaphoretic. No erythema. No pallor.  Psychiatric: She has a normal mood and affect. Her behavior is normal.  Nursing note and vitals  reviewed.   ED Course  Procedures  DIAGNOSTIC STUDIES:  Oxygen Saturation is 96% on RA, normal by my interpretation.    COORDINATION OF CARE:  3:32 PM Will administer Tylenol and order imaging. Discussed treatment plan with pt at bedside and pt agreed to plan.  Labs Review Labs Reviewed - No data to display  Imaging Review No results found.    EKG Interpretation None      Filed Vitals:   04/02/16 1457  BP: 148/72  Pulse: 90  Temp: 98.7 F (37.1 C)  TempSrc: Oral  Resp: 20  SpO2: 96%     MDM   Meds given in ED:  Medications  acetaminophen (TYLENOL) tablet 650 mg (not administered)    New Prescriptions   No medications on file    Final diagnoses:  Left knee pain  Fall (on)(from) sidewalk curb, sequela   Patient presents to the emergency department after a trip and fall off the sidewalk complaining of left knee pain. No evidence of other trauma. No head injury. On exam she is neurovascularly intact. At shift change patient is awaiting x-ray. Patient care head off to Domenic Moras, PA-C who will disposition the patient accordingly.   I personally performed the services described in this documentation, which was scribed in my presence. The recorded information has been reviewed and is accurate.       Waynetta Pean, PA-C 04/02/16 1601  Charlesetta Shanks, MD 04/08/16 Shelah Lewandowsky

## 2016-04-02 NOTE — ED Notes (Cosign Needed)
Pt had a mechanical fall and landed directly on her L knee.  Xray demonstrates a transverse patella fx.  This is a closed injury.  Knee immobilizer placed.  I have ordered a set of walker for patient. Pain medication given, and ortho referral provided.  Care discussed with Dr. Johnney Killian who has seen and evaluate pt and agrees with plan.    Results for orders placed or performed in visit on 03/07/16  Lipid Profile  Result Value Ref Range   Cholesterol 217 (H) 125 - 200 mg/dL   Triglycerides 103 <150 mg/dL   HDL 71 >=46 mg/dL   Total CHOL/HDL Ratio 3.1 <=5.0 Ratio   VLDL 21 <30 mg/dL   LDL Cholesterol 125 <130 mg/dL  Comp Met (CMET)  Result Value Ref Range   Sodium 141 135 - 146 mmol/L   Potassium 3.7 3.5 - 5.3 mmol/L   Chloride 104 98 - 110 mmol/L   CO2 27 20 - 31 mmol/L   Glucose, Bld 94 65 - 99 mg/dL   BUN 24 7 - 25 mg/dL   Creat 1.56 (H) 0.60 - 0.93 mg/dL   Total Bilirubin 0.7 0.2 - 1.2 mg/dL   Alkaline Phosphatase 48 33 - 130 U/L   AST 17 10 - 35 U/L   ALT 7 6 - 29 U/L   Total Protein 6.6 6.1 - 8.1 g/dL   Albumin 4.4 3.6 - 5.1 g/dL   Calcium 9.5 8.6 - 10.4 mg/dL   Dg Knee Complete 4 Views Left  04/02/2016  CLINICAL DATA:  Status post fall.  Left knee pain. EXAM: LEFT KNEE - COMPLETE 4+ VIEW COMPARISON:  None. FINDINGS: Transverse fracture of the inferior left patella with 8 mm of distraction. There is a moderate joint effusion. There is no evidence of arthropathy or other focal bone abnormality. Soft tissues are unremarkable. IMPRESSION: Transverse fracture of the inferior left patella with 8 mm of distraction. Electronically Signed   By: Kathreen Devoid   On: 04/02/2016 16:10   Mm Screening Breast Tomo Bilateral  03/27/2016  CLINICAL DATA:  Screening. EXAM: 2D DIGITAL SCREENING BILATERAL MAMMOGRAM WITH CAD AND ADJUNCT TOMO COMPARISON:  Previous exam(s). ACR Breast Density Category b: There are scattered areas of fibroglandular density. FINDINGS: There are no findings suspicious for  malignancy. Images were processed with CAD. IMPRESSION: No mammographic evidence of malignancy. A result letter of this screening mammogram will be mailed directly to the patient. RECOMMENDATION: Screening mammogram in one year. (Code:SM-B-01Y) BI-RADS CATEGORY  1: Negative. Electronically Signed   By: Lajean Manes M.D.   On: 03/27/2016 11:21   BP 148/72 mmHg  Pulse 90  Temp(Src) 98.7 F (37.1 C) (Oral)  Resp 20  SpO2 96%     Domenic Moras, PA-C 04/02/16 1634

## 2016-04-02 NOTE — ED Notes (Signed)
Pt fell on the side walk flat on her face trying to get out of car.  Pt complaining pain to left knee with swelling and pulse pal;pable.  No LOC or head injury.

## 2016-04-02 NOTE — Discharge Instructions (Signed)
Patellar Fracture With Rehab Patellar fracture is a break in the kneecap (patella). These fractures may be complete or incomplete. It is common for injuries that cause a patellar fracture to also cause a tear (sprain) of the ligaments or ligament-like tissue (retinaculum), or a tear (strain) in the patellar tendon. SYMPTOMS   Severe pain in the knee at the time of injury.  Tenderness and swelling in the knee.  Pain with movement of the knee.  Inability to straighten a bent knee by its own power.  Catching or locking of the knee.  Bleeding and bruising (contusion) in the knee.  Difficulty bearing weight on the injured limb, especially when trying to get up from a sitting position, go up or down stairs, or jump.  Visible deformity, if the bone fragments are out of alignment (displaced).  Numbness and coldness in the leg and foot beyond the fracture site, if the blood supply is impaired. CAUSES  Patellar fractures occur when a force is placed on the kneecap that is greater than it can handle. Common causes of injury include:  Direct trauma to the patella.  Indirect stress caused by twisting or bending. RISK INCREASES WITH:   Contact sports (football, hockey, soccer).  Basketball.  Motor sports.  Bony abnormalities or diseases of the bone (osteoporosis, bone tumor).  Metabolism disorders, hormone problems, nutrition deficiencies and disorders (anorexia or bulimia).  Poor strength and flexibility. PREVENTION  Warm up and stretch properly before activity.  Maintain physical fitness:  Strength, flexibility, and endurance.  Cardiovascular fitness.  Wear properly fitted and padded protective equipment (knee pads). PROGNOSIS  If treated properly, patellar fractures usually heal.  RELATED COMPLICATIONS   Fracture fails to heal (nonunion).  Fracture heals in a poor position (malunion).  Bone death (necrosis), due to interruption of blood supply to the  bone.  Stopping of normal bone growth in children.  Risks of surgery: infection, bleeding, injury to nerves (numbness, weakness, paralysis), need for further surgery, and pain from the wires or screws used to fix the fracture.  Infection in the skin, broken over the fracture site (open fractures).  Arthritic knee joint.  Longer healing time, if activity is resumed too soon.  Proneness to repeated knee injury.  After healing, risk of roughened contact surface of the kneecap, causing pain with sitting, when rising from a sitting position, when going up or down stairs or hills, and when jumping or running.  Stiff knee.  Unstable kneecap. TREATMENT  Treatment first involves the use of ice and medicine to reduce pain and inflammation. If the bone fragments are out of alignment (displaced), immediate realigning of the bone (reduction) by a person trained in the procedure is required. Fractures that cannot be realigned by hand, or are open (bones protrude through the skin), may require surgery to hold the fracture in place with screws, pins, and plates. Once the bone is in proper alignment, restraining the knee is often needed to allow for healing. After restraint, it is important to perform strengthening and stretching exercises to help regain strength and a full range of motion. These exercises may be completed at home or with a therapist. MEDICATION   If pain medicine is needed, nonsteroidal anti-inflammatory medicines (aspirin and ibuprofen), or other minor pain relievers (acetaminophen), are often advised.  Do not take pain medicine for 7 days before surgery.  Prescription pain relievers may be given if your caregiver thinks they are needed. Use only as directed and only as much as you need. SEEK  MEDICAL CARE IF:  Symptoms get worse or do not improve in 2 weeks, despite treatment.  The following occur after restraint or surgery. (Report any of these signs immediately):  Swelling above  or below the fracture site.  Severe, persistent pain.  Blue or gray skin below the fracture site or in the toes. Numbness or loss of feeling below the fracture site.  New, unexplained symptoms develop. (Drugs used in treatment may produce side effects.) EXERCISES RANGE OF MOTION (ROM) AND STRETCHING EXERCISES - Patellar Fracture Your fractured patella may be corrected with surgery and/or with a knee brace. Upon your caregiver's instructions, you may begin your rehabilitation with some of the following exercises. As you complete these, remember:   These initial exercises are intended to be gentle. They will help you restore motion without increasing any swelling.  Completing these exercises allows less painful movement and prepares you for the more aggressive strengthening exercises in Phase II.  An effective stretch should be held for at least 30 seconds.  A stretch should never be painful. You should only feel a gentle lengthening or release in the stretched tissue. RANGE OF MOTION - Knee Flexion and Extension, Active-Assisted  Sit on the edge of a table or chair with your thighs firmly supported. It may be helpful to place a folded towel under the end of your right / left thigh.  Flexion (bending): Place the ankle of your healthy leg on top of the other ankle. Use your healthy leg to gently bend your right / left knee until you feel a mild tension across the top of your knee.  Hold for __________ seconds.  Extension (straightening): Switch your ankles so your right / left leg is on top. Use your healthy leg to straighten your right / left knee until you feel a mild tension on the backside of your knee.  Hold for __________ seconds. Repeat __________ times. Complete this exercise __________ times per day. RANGE OF MOTION - Knee Flexion, Active  Lie on your back with both knees straight. (If this causes back discomfort, bend your opposite knee, placing your foot flat on the  floor.)  Slowly slide your heel back toward your buttocks until you feel a gentle stretch in the front of your knee or thigh.  Hold for __________ seconds. Slowly slide your heel back to the starting position. Repeat __________ times. Complete this exercise __________ times per day.  STRETCH - Knee Flexion, Supine  Lie on the floor with your right / left heel and foot lightly touching the wall. (Place both feet on the wall if you do not use a door frame.)  Without using any effort, allow gravity to slide your foot down the wall slowly until you feel a gentle stretch in the front of your right / left knee.  Hold this stretch for __________ seconds. Then return the leg to the starting position, using your health leg for help, if needed. Repeat __________ times. Complete this stretch __________ times per day.  STRETCH - Knee Extension Sitting  Sit with your right / left leg/heel propped on another chair, coffee table, or foot stool.  Allow your leg muscles to relax, letting gravity straighten out your knee.*  You should feel a stretch behind your right / left knee. Hold this position for __________ seconds. Repeat __________ times. Complete this stretch __________ times per day.  *Your physician, physical therapist or athletic trainer may instruct you place a __________ weight on your thigh, just above your kneecap,  to deepen the stretch.  STRETCH - Knee Extension, Prone  Lie on your stomach on a firm surface, such as a bed or countertop. Place your right / left knee and leg just beyond the edge of the surface. You may wish to place a towel under the far end of your right / left thigh for comfort.  Relax your leg muscles and allow gravity to straighten your knee. Your caregiver may advise you to add an ankle weight if more resistance is helpful for you.  You should feel a stretch in the back of your right / left knee. Hold this position for __________ seconds. Repeat __________ times.  Complete this __________ times per day. *Your physician, physical therapist or athletic trainer may instruct you place a __________ weight on your ankle to deepen the stretch.  STRENGTHENING EXERCISES - Patellar Fracture  Regardless of whether your physician has stabilized your knee with surgery or a brace, he or she may have you begin some of the following exercises to rehabilitate your injury. The exercises may resolve your symptoms with or without further involvement from your physician, physical therapist, or athletic trainer. While completing these exercises, remember:   Muscles can gain both the endurance and the strength needed for everyday activities through controlled exercises.  Complete these exercises as instructed by your physician, physical therapist, or athletic trainer. Increase the resistance and repetitions only as guided by your caregiver. STRENGTH - Quadriceps, Isometrics  Lie on your back with your right / left leg extended and your opposite knee bent.  Gradually tense the muscles in the front of your right / left thigh. You should see either your kneecap slide up toward your hip or increased dimpling just above the knee. This motion will push the back of the knee down toward the floor, mat, or bed on which you are lying.  Hold the muscle as tight as you can, without increasing your pain, for __________ seconds.  Relax the muscles slowly and completely in between each repetition. Repeat __________ times. Complete this exercise __________ times per day.  STRENGTH - Quadriceps, Short Arcs  Lie on your back. Place a __________ inch towel roll under your right / left knee, so that the knee bends slightly.  Raise only your lower leg by tightening the muscles in the front of your thigh. Do not allow your thigh to rise.  Hold this position for __________ seconds. Repeat __________ times. Complete this exercise __________ times per day.  OPTIONAL ANKLE WEIGHTS: Begin with  ____________________, but DO NOT exceed ____________________. Increase in 1 lb/0.5 kg increments.  STRENGTH - Quadriceps, Straight Leg Raises Quality counts! Watch for signs that the quadriceps muscle is working, to be sure you are strengthening the correct muscles and not "cheating" by substituting with healthier muscles.  Lay on your back with your right / left leg extended and your opposite knee bent.  Tense the muscles in the front of your right / left thigh. You should see either your kneecap slide up or increased dimpling just above the knee. Your thigh may even shake a bit.  Tighten these muscles even more and raise your leg 4 to 6 inches off the floor. Hold for __________ seconds.  Keeping these muscles tense, lower your leg.  Relax the muscles slowly and completely between each repetition. Repeat __________ times. Complete this exercise __________ times per day.  STRENGTH - Hamstring, Isometrics  Lie on your back on a firm surface.  Bend your right / left knee  approximately __________ degrees.  Dig your heel into the surface as if you are trying to pull it toward your buttocks. Tighten the muscles in the back of your thighs to "dig" as hard as you can without increasing any pain.  Hold this position for __________ seconds.  Release the tension gradually and allow your muscle to completely relax for __________ seconds between each exercise. Repeat __________ times. Complete this exercise __________ times per day.  STRENGTH - Hamstring, Curls  Lay on your stomach with your legs extended. (If you lay on a bed, your feet may hang over the edge.)  Tighten the muscles in the back of your thigh to bend your right / left knee up to 90 degrees. Keep your hips flat on the bed.  Hold this position for __________ seconds.  Slowly lower your leg back to the starting position. Repeat __________ times. Complete this exercise __________ times per day.  STRENGTH - Hip Extensors,  Bridge  Lie on your back on a firm surface. Bend your knees and place your feet flat on the floor.  Tighten your buttocks muscles and lift your bottom off the floor until your trunk is level with your thighs. You should feel the muscles in your buttocks and back of your thighs working. If you do not feel these muscles, slide your feet 1-2 inches further away from your buttocks.  Hold this position for __________ seconds.  Slowly lower your hips to the starting position and allow your buttock muscles relax completely before beginning the next repetition.  If this exercise is too easy, you may cross your arms over your chest. Repeat __________ times. Complete this exercise __________ times per day.  STRENGTH - Hip Abductors, Straight Leg Raises Be aware of your form throughout the entire exercise, so that you exercise the correct muscles. Poor form means that you are not strengthening the correct muscles.  Lie on your side so that your head, shoulders, knee and hip line up. You may bend your lower knee to help maintain your balance. Your right / left leg should be on top.  Roll your hips slightly forward, so that your hips are stacked directly over each other and your right / left knee is facing forward.  Lift your top leg up 4 to 6 inches, leading with your heel. Be sure that your foot does not drift forward or that your knee does not roll toward the ceiling.  Hold this position for __________ seconds. You should feel the muscles in your outer hip lifting. (You may not notice this until your leg begins to tire.)  Slowly lower your leg to the starting position. Allow the muscles to fully relax before beginning the next repetition. Repeat __________ times. Complete this exercise __________ times per day.  STRENGTH - Hip Abductors, Quadruped  On a firm, lightly padded surface, position yourself on your hands and knees. Your hands should be directly below your shoulders and your knees should be  directly below your hips.  Keeping your right / left knee bent, lift your leg out to the side. Keep your legs level and in line with your shoulders.  Hold for __________ seconds.  Keeping your trunk steady and your hips level, slowly lower your leg to the starting position. Repeat __________ times. Complete this exercise __________ times per day.  STRENGTH - Quadriceps, Squats  Stand in a door frame so that your feet and knees are in line with the frame.  Use your hands for balance, not  support, on the frame.  Slowly lower your weight, bending at the hips and knees. Keep your lower legs upright so that they are parallel with the door frame. Squat only within the range that does not increase your knee pain. Never let your hips drop below your knees.  Slowly return upright, pushing with your legs, not pulling with your hands. Repeat __________ times. Complete this exercise __________ times per day.  STRENGTH - Plantar-flexors, Standing  Stand with your feet shoulder width apart. Steady yourself with a wall or table, using as little support as needed.  Keeping your weight evenly spread over the width of your feet, rise up on your toes.*  Hold this position for __________ seconds. Repeat exercise __________ times, __________ times per day.  *If this is too easy, shift your weight toward your right / left leg until you feel challenged. Ultimately, you may be asked to do this exercise while standing on your right / left foot only.   This information is not intended to replace advice given to you by your health care provider. Make sure you discuss any questions you have with your health care provider.   Document Released: 11/06/2005 Document Revised: 03/23/2015 Document Reviewed: 02/18/2009 Elsevier Interactive Patient Education 2016 [employer].   How to Use a Knee Brace A knee brace is a device that you wear to support your knee, especially if the knee is healing after an injury or  surgery. There are several types of knee braces. Some are designed to prevent an injury (prophylactic brace). These are often worn during sports. Others support an injured knee (functional brace) or keep it still while it heals (rehabilitative brace). People with severe arthritis of the knee may benefit from a brace that takes some pressure off the knee (unloader brace). Most knee braces are made from a combination of cloth and metal or plastic.  You may need to wear a knee brace to:  Relieve knee pain.  Help your knee support your weight (improve stability).  Help you walk farther (improve mobility).  Prevent injury.  Support your knee while it heals from surgery or from an injury. RISKS AND COMPLICATIONS Generally, knee braces are very safe to wear. However, problems may occur, including:  Skin irritation that may lead to infection.  Making your condition worse if you wear the brace in the wrong way. HOW TO USE A KNEE BRACE Different braces will have different instructions for use. Your health care provider will tell you or show you:  How to put on your brace.  How to adjust the brace.  When and how often to wear the brace.  How to remove the brace.  If you will need any assistive devices in addition to the brace, such as crutches or a cane. In general, your brace should:  Have the hinge of the brace line up with the bend of your knee.  Have straps, hooks, or tapes that fasten snugly around your leg.  Not feel too tight or too loose. HOW TO CARE FOR A KNEE BRACE  Check your brace often for signs of damage, such as loose connections or attachments. Your knee brace may get damaged or wear out during normal use.  Wash the fabric parts of your brace with soap and water.  Read the insert that comes with your brace for other specific care instructions. SEEK MEDICAL CARE IF:  Your knee brace is too loose or too tight and you cannot adjust it.  Your knee brace  causes skin  redness, swelling, bruising, or irritation.  Your knee brace is not helping.  Your knee brace is making your knee pain worse.   This information is not intended to replace advice given to you by your health care provider. Make sure you discuss any questions you have with your health care provider.   Document Released: 01/27/2004 Document Revised: 07/28/2015 Document Reviewed: 03/01/2015 Elsevier Interactive Patient Education [employer].

## 2016-04-04 ENCOUNTER — Telehealth: Payer: Self-pay | Admitting: Cardiovascular Disease

## 2016-04-04 NOTE — Telephone Encounter (Signed)
[name] is a low risk for patellar repair. She may hold her aspirin for 5 days if needed forma surgical standpoint

## 2016-04-04 NOTE — Telephone Encounter (Signed)
Request for surgical clearance:  1. What type of surgery is being performed? Oris Patella Fracture left -need this asap  2. When is this surgery scheduled? 04-06-16   3. Are there any medications that need to be held prior to surgery and how long?General Cardiac Cleaeance   4. Name of physician performing surgery? Dr Lacey Jensen   5. What is your office phone and fax number? [phone] and fax number is [phone] AttLsherri  6.

## 2016-04-05 ENCOUNTER — Encounter (HOSPITAL_BASED_OUTPATIENT_CLINIC_OR_DEPARTMENT_OTHER): Payer: Self-pay | Admitting: *Deleted

## 2016-04-05 NOTE — Telephone Encounter (Signed)
Clearance faxed and confirmation received.  

## 2016-04-05 NOTE — H&P (Signed)
[name] is an old patient. She has not been seen since 2014. She is coming in for a new traumatic event. She fell on concrete with an impact injury to the left knee occurring on 04-03-15. She was seen in the emergency room with a displaced 2-3 part patella fracture. She was placed in a knee immobilizer. She comes in today to discuss definitive treatment. The ER films were review: Distracted, mainly two part patellar fracture junction of the middle inferior third. Not a lot of underlying degenerative changes. This is a closed injury.   The entire history is reviewed.  Cardiac history is followed by Dr. 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 She is five years into a renal transplant and continues to do well. She is on Prednisone 5 mg a day as well as some other suppressive agents.  Oral treatment of diabetes as well. A long list of medications is reviewed, outlined and included in the chart. General exam as outlined and included in the chart.   Past medical, social and family history reviewed in detail on the patient questionnaire and signed.  Review of systems: As detailed in the HPI. All others reviewed and are negative.  EXAMINATION: HT:  5\' 3"   WT: 160 pounds.  BP: 141/74   Well-developed, well-nourished 75 year old female Alert and oriented times three.  Heart regular rate and rhythm. No pulmonary compromise.  Examination of the left leg reveals an obvious patella fracture. She cannot straight leg raise. Swelling is not bad.  Other ligaments stable. Neurovascularly intact distally.   IMPRESSION: Displaced patellar fracture left.   PLAN:  This needs to be repaired with open reduction internal fixation. The procedure, risks, benefits and complications are reviewed. Paperwork completed. All questions were encouraged and answered. We will see her at the time of operative intervention. We will need  cardiac clearance from Dr. Acie Fredrickson. We will also use high dose Prednisone perioperatively due to her long term use of daily Prednisone. This was a traumatic and not an insufficiency fracture.    Ninetta Lights, M.D.

## 2016-04-06 ENCOUNTER — Encounter (HOSPITAL_BASED_OUTPATIENT_CLINIC_OR_DEPARTMENT_OTHER): Payer: Self-pay | Admitting: Anesthesiology

## 2016-04-06 ENCOUNTER — Ambulatory Visit (HOSPITAL_BASED_OUTPATIENT_CLINIC_OR_DEPARTMENT_OTHER): Payer: Medicare Other | Admitting: Anesthesiology

## 2016-04-06 ENCOUNTER — Encounter (HOSPITAL_BASED_OUTPATIENT_CLINIC_OR_DEPARTMENT_OTHER)
Admission: RE | Disposition: A | Discharge status: Home / Self Care | Payer: Self-pay | Source: Ambulatory Visit | Attending: Orthopedic Surgery

## 2016-04-06 ENCOUNTER — Ambulatory Visit (HOSPITAL_BASED_OUTPATIENT_CLINIC_OR_DEPARTMENT_OTHER)
Admission: RE | Admit: 2016-04-06 | Discharge: 2016-04-06 | Disposition: A | Discharge status: Home / Self Care | Payer: Medicare Other | Source: Ambulatory Visit | Attending: Orthopedic Surgery | Admitting: Orthopedic Surgery

## 2016-04-06 DIAGNOSIS — W19XXXA Unspecified fall, initial encounter: Secondary | ICD-10-CM | POA: Diagnosis not present

## 2016-04-06 DIAGNOSIS — Z7952 Long term (current) use of systemic steroids: Secondary | ICD-10-CM | POA: Diagnosis not present

## 2016-04-06 DIAGNOSIS — E119 Type 2 diabetes mellitus without complications: Secondary | ICD-10-CM | POA: Insufficient documentation

## 2016-04-06 DIAGNOSIS — Z96642 Presence of left artificial hip joint: Secondary | ICD-10-CM | POA: Diagnosis not present

## 2016-04-06 DIAGNOSIS — S82002A Unspecified fracture of left patella, initial encounter for closed fracture: Secondary | ICD-10-CM | POA: Insufficient documentation

## 2016-04-06 DIAGNOSIS — I10 Essential (primary) hypertension: Secondary | ICD-10-CM | POA: Diagnosis not present

## 2016-04-06 DIAGNOSIS — Z87891 Personal history of nicotine dependence: Secondary | ICD-10-CM | POA: Diagnosis not present

## 2016-04-06 DIAGNOSIS — I2510 Atherosclerotic heart disease of native coronary artery without angina pectoris: Secondary | ICD-10-CM | POA: Diagnosis not present

## 2016-04-06 DIAGNOSIS — Z951 Presence of aortocoronary bypass graft: Secondary | ICD-10-CM | POA: Insufficient documentation

## 2016-04-06 DIAGNOSIS — I252 Old myocardial infarction: Secondary | ICD-10-CM | POA: Insufficient documentation

## 2016-04-06 DIAGNOSIS — Z940 Kidney transplant status: Secondary | ICD-10-CM | POA: Diagnosis not present

## 2016-04-06 HISTORY — PX: ORIF PATELLA: SHX5033

## 2016-04-06 SURGERY — OPEN REDUCTION INTERNAL FIXATION (ORIF) PATELLA
Anesthesia: Regional | Site: Knee | Laterality: Left

## 2016-04-06 MED ORDER — METHYLPREDNISOLONE SODIUM SUCC 125 MG IJ SOLR
INTRAMUSCULAR | Status: AC
  Filled 2016-04-06: qty 4

## 2016-04-06 MED ORDER — LIDOCAINE HCL (CARDIAC) 20 MG/ML IV SOLN
INTRAVENOUS | Status: DC | PRN
  Administered 2016-04-06: 50 mg via INTRAVENOUS

## 2016-04-06 MED ORDER — BUPIVACAINE-EPINEPHRINE (PF) 0.5% -1:200000 IJ SOLN
INTRAMUSCULAR | Status: DC | PRN
  Administered 2016-04-06: 25 mL via PERINEURAL

## 2016-04-06 MED ORDER — LACTATED RINGERS IV SOLN: INTRAVENOUS | Status: DC

## 2016-04-06 MED ORDER — CEFAZOLIN SODIUM-DEXTROSE 2-4 GM/100ML-% IV SOLN
2.0000 g | INTRAVENOUS | Status: AC
  Administered 2016-04-06: 2 g via INTRAVENOUS

## 2016-04-06 MED ORDER — ONDANSETRON HCL 4 MG/2ML IJ SOLN
INTRAMUSCULAR | Status: AC
  Filled 2016-04-06: qty 2

## 2016-04-06 MED ORDER — SCOPOLAMINE 1 MG/3DAYS TD PT72: 1.0000 | MEDICATED_PATCH | Freq: Once | TRANSDERMAL | Status: DC | PRN

## 2016-04-06 MED ORDER — ONDANSETRON HCL 4 MG PO TABS: 4.0000 mg | ORAL_TABLET | Freq: Three times a day (TID) | ORAL | Status: DC | PRN

## 2016-04-06 MED ORDER — FENTANYL CITRATE (PF) 100 MCG/2ML IJ SOLN
50.0000 ug | INTRAMUSCULAR | Status: AC | PRN
  Administered 2016-04-06: 25 ug via INTRAVENOUS
  Administered 2016-04-06 (×2): 50 ug via INTRAVENOUS
  Administered 2016-04-06: 25 ug via INTRAVENOUS

## 2016-04-06 MED ORDER — FENTANYL CITRATE (PF) 100 MCG/2ML IJ SOLN: 25.0000 ug | INTRAMUSCULAR | Status: DC | PRN

## 2016-04-06 MED ORDER — CHLORHEXIDINE GLUCONATE 4 % EX LIQD: 60.0000 mL | Freq: Once | CUTANEOUS | Status: DC

## 2016-04-06 MED ORDER — LACTATED RINGERS IV SOLN
INTRAVENOUS | Status: DC
  Administered 2016-04-06: 12:00:00 via INTRAVENOUS

## 2016-04-06 MED ORDER — OXYCODONE HCL 5 MG PO TABS
5.0000 mg | ORAL_TABLET | Freq: Once | ORAL | Status: AC | PRN
  Administered 2016-04-06: 5 mg via ORAL

## 2016-04-06 MED ORDER — FENTANYL CITRATE (PF) 100 MCG/2ML IJ SOLN
INTRAMUSCULAR | Status: AC
  Filled 2016-04-06: qty 2

## 2016-04-06 MED ORDER — LIDOCAINE 2% (20 MG/ML) 5 ML SYRINGE
INTRAMUSCULAR | Status: AC
  Filled 2016-04-06: qty 5

## 2016-04-06 MED ORDER — PROMETHAZINE HCL 25 MG/ML IJ SOLN: 6.2500 mg | INTRAMUSCULAR | Status: DC | PRN

## 2016-04-06 MED ORDER — METHYLPREDNISOLONE SODIUM SUCC 125 MG IJ SOLR
INTRAMUSCULAR | Status: DC | PRN
  Administered 2016-04-06: 125 mg via INTRAVENOUS

## 2016-04-06 MED ORDER — GLYCOPYRROLATE 0.2 MG/ML IJ SOLN: 0.2000 mg | Freq: Once | INTRAMUSCULAR | Status: DC | PRN

## 2016-04-06 MED ORDER — CEFAZOLIN SODIUM-DEXTROSE 2-4 GM/100ML-% IV SOLN
INTRAVENOUS | Status: AC
  Filled 2016-04-06: qty 100

## 2016-04-06 MED ORDER — PROPOFOL 10 MG/ML IV BOLUS
INTRAVENOUS | Status: DC | PRN
  Administered 2016-04-06: 200 mg via INTRAVENOUS

## 2016-04-06 MED ORDER — OXYCODONE HCL 5 MG PO TABS
ORAL_TABLET | ORAL | Status: AC
  Filled 2016-04-06: qty 1

## 2016-04-06 MED ORDER — MIDAZOLAM HCL 2 MG/2ML IJ SOLN
1.0000 mg | INTRAMUSCULAR | Status: DC | PRN
  Administered 2016-04-06: 1 mg via INTRAVENOUS

## 2016-04-06 MED ORDER — OXYCODONE-ACETAMINOPHEN 5-325 MG PO TABS: 1.0000 | ORAL_TABLET | ORAL | Status: DC | PRN

## 2016-04-06 MED ORDER — MIDAZOLAM HCL 2 MG/2ML IJ SOLN
INTRAMUSCULAR | Status: AC
  Filled 2016-04-06: qty 2

## 2016-04-06 MED ORDER — PROPOFOL 10 MG/ML IV BOLUS
INTRAVENOUS | Status: AC
  Filled 2016-04-06: qty 20

## 2016-04-06 SURGICAL SUPPLY — 71 items
APL SKNCLS STERI-STRIP NONHPOA (GAUZE/BANDAGES/DRESSINGS) ×1
BAG DECANTER FOR FLEXI CONT (MISCELLANEOUS) IMPLANT
BANDAGE ACE 6X5 VEL STRL LF (GAUZE/BANDAGES/DRESSINGS) ×2 IMPLANT
BANDAGE ESMARK 6X9 LF (GAUZE/BANDAGES/DRESSINGS) ×1 IMPLANT
BENZOIN TINCTURE PRP APPL 2/3 (GAUZE/BANDAGES/DRESSINGS) ×1 IMPLANT
BIT DRILL CANN 2.7X625 NONSTRL (BIT) ×2 IMPLANT
BLADE SURG 15 STRL LF DISP TIS (BLADE) ×1 IMPLANT
BLADE SURG 15 STRL SS (BLADE) ×4
BNDG CMPR 9X6 STRL LF SNTH (GAUZE/BANDAGES/DRESSINGS) ×1
BNDG ESMARK 6X9 LF (GAUZE/BANDAGES/DRESSINGS) ×2
CANISTER SUCT 1200ML W/VALVE (MISCELLANEOUS) ×2 IMPLANT
CUFF TOURNIQUET SINGLE 34IN LL (TOURNIQUET CUFF) ×1 IMPLANT
DRAPE EXTREMITY T 121X128X90 (DRAPE) ×2 IMPLANT
DRAPE OEC MINIVIEW 54X84 (DRAPES) ×2 IMPLANT
DRAPE U-SHAPE 47X51 STRL (DRAPES) ×2 IMPLANT
DRSG PAD ABDOMINAL 8X10 ST (GAUZE/BANDAGES/DRESSINGS) ×2 IMPLANT
DURAPREP 26ML APPLICATOR (WOUND CARE) ×2 IMPLANT
ELECT REM PT RETURN 9FT ADLT (ELECTROSURGICAL) ×2
ELECTRODE REM PT RTRN 9FT ADLT (ELECTROSURGICAL) ×1 IMPLANT
GAUZE SPONGE 4X4 12PLY STRL (GAUZE/BANDAGES/DRESSINGS) ×2 IMPLANT
GAUZE XEROFORM 1X8 LF (GAUZE/BANDAGES/DRESSINGS) ×1 IMPLANT
GLOVE BIOGEL PI IND STRL 7.0 (GLOVE) ×3 IMPLANT
GLOVE BIOGEL PI INDICATOR 7.0 (GLOVE) ×3
GLOVE ECLIPSE 6.5 STRL STRAW (GLOVE) ×2 IMPLANT
GLOVE ECLIPSE 7.0 STRL STRAW (GLOVE) ×2 IMPLANT
GLOVE SURG ORTHO 8.0 STRL STRW (GLOVE) ×2 IMPLANT
GOWN STRL REUS W/ TWL LRG LVL3 (GOWN DISPOSABLE) ×2 IMPLANT
GOWN STRL REUS W/ TWL XL LVL3 (GOWN DISPOSABLE) ×1 IMPLANT
GOWN STRL REUS W/TWL LRG LVL3 (GOWN DISPOSABLE) ×2
GOWN STRL REUS W/TWL XL LVL3 (GOWN DISPOSABLE) ×5 IMPLANT
GUIDEWARE NON THREAD 1.25X150 (WIRE) ×4
GUIDEWIRE NON THREAD 1.25X150 (WIRE) IMPLANT
IMMOBILIZER KNEE 22 UNIV (SOFTGOODS) ×4 IMPLANT
IMMOBILIZER KNEE 24 THIGH 36 (MISCELLANEOUS) IMPLANT
IMMOBILIZER KNEE 24 UNIV (MISCELLANEOUS)
KNEE WRAP E Z 3 GEL PACK (MISCELLANEOUS) ×1 IMPLANT
LASSO 90 CVE QUICKPAS (DISPOSABLE) ×1 IMPLANT
NDL HYPO 25X1 1.5 SAFETY (NEEDLE) IMPLANT
NEEDLE HYPO 25X1 1.5 SAFETY (NEEDLE) IMPLANT
NS IRRIG 1000ML POUR BTL (IV SOLUTION) ×2 IMPLANT
PACK ARTHROSCOPY DSU (CUSTOM PROCEDURE TRAY) ×2 IMPLANT
PACK BASIN DAY SURGERY FS (CUSTOM PROCEDURE TRAY) ×2 IMPLANT
PAD CAST 4YDX4 CTTN HI CHSV (CAST SUPPLIES) ×1 IMPLANT
PADDING CAST COTTON 4X4 STRL (CAST SUPPLIES) ×2
PENCIL BUTTON HOLSTER BLD 10FT (ELECTRODE) ×2 IMPLANT
SCREW CANN S THRD/36 4.0 (Screw) ×4 IMPLANT
SCREW SHORT THREAD 4.0X32 (Screw) ×1 IMPLANT
SCREW SHORT THREAD 4.0X34 (Screw) ×2 IMPLANT
SLEEVE SCD COMPRESS KNEE MED (MISCELLANEOUS) ×1 IMPLANT
SPONGE LAP 4X18 X RAY DECT (DISPOSABLE) ×2 IMPLANT
STAPLER VISISTAT 35W (STAPLE) IMPLANT
STOCKINETTE 6  STRL (DRAPES)
STOCKINETTE 6 STRL (DRAPES) IMPLANT
STRIP CLOSURE SKIN 1/2X4 (GAUZE/BANDAGES/DRESSINGS) ×1 IMPLANT
SUCTION FRAZIER HANDLE 10FR (MISCELLANEOUS) ×1
SUCTION TUBE FRAZIER 10FR DISP (MISCELLANEOUS) IMPLANT
SUT ETHILON 3 0 PS 1 (SUTURE) IMPLANT
SUT FIBERWIRE #2 38 T-5 BLUE (SUTURE) ×4
SUT STEEL 3 0 (SUTURE) IMPLANT
SUT STEEL 5 (SUTURE) IMPLANT
SUT VIC AB 0 CT1 27 (SUTURE) ×2
SUT VIC AB 0 CT1 27XBRD ANBCTR (SUTURE) ×3 IMPLANT
SUT VIC AB 2-0 SH 27 (SUTURE) ×4
SUT VIC AB 2-0 SH 27XBRD (SUTURE) IMPLANT
SUT VIC AB 3-0 SH 27 (SUTURE)
SUT VIC AB 3-0 SH 27X BRD (SUTURE) ×1 IMPLANT
SUTURE FIBERWR #2 38 T-5 BLUE (SUTURE) ×2 IMPLANT
SYR BULB 3OZ (MISCELLANEOUS) ×2 IMPLANT
SYR CONTROL 10ML LL (SYRINGE) ×2 IMPLANT
UNDERPAD 30X30 (UNDERPADS AND DIAPERS) ×2 IMPLANT
YANKAUER SUCT BULB TIP NO VENT (SUCTIONS) ×2 IMPLANT

## 2016-04-06 NOTE — Interval H&P Note (Signed)
History and Physical Interval Note:  04/06/2016 7:26 AM  [name]  has presented today for surgery, with the diagnosis of UNSPECIFIED FRATURE OF UNSPECIFED PATELLA, INITIAL ENCOUNTER FOR CLOSED FRACTURE  The various methods of treatment have been discussed with the patient and family. After consideration of risks, benefits and other options for treatment, the patient has consented to  Procedure(s): OPEN REDUCTION INTERNAL (ORIF) LEFT  PATELLA (Left) as a surgical intervention .  The patient's history has been reviewed, patient examined, no change in status, stable for surgery.  I have reviewed the patient's chart and labs.  Questions were answered to the patient's satisfaction.     [name]

## 2016-04-06 NOTE — Anesthesia Postprocedure Evaluation (Signed)
Anesthesia Post Note  Patient: [name]  Procedure(s) Performed: Procedure(s) (LRB): OPEN REDUCTION INTERNAL (ORIF) LEFT  PATELLA (Left)  Patient location during evaluation: PACU Anesthesia Type: General Level of consciousness: awake and alert Pain management: pain level controlled Vital Signs Assessment: post-procedure vital signs reviewed and stable Respiratory status: spontaneous breathing, nonlabored ventilation and respiratory function stable Cardiovascular status: blood pressure returned to baseline and stable Postop Assessment: no signs of nausea or vomiting Anesthetic complications: no    Last Vitals:  Filed Vitals:   04/06/16 1415 04/06/16 1430  BP: 137/73 136/71  Pulse: 92 90  Temp:    Resp: 13 16    Last Pain:  Filed Vitals:   04/06/16 1506  PainSc: 5                  Rigel Filsinger A

## 2016-04-06 NOTE — Anesthesia Preprocedure Evaluation (Addendum)
Anesthesia Evaluation  Patient identified by MRN, date of birth, ID band Patient awake    Reviewed: Allergy & Precautions, NPO status , Patient's Chart, lab work & pertinent test results  Airway Mallampati: II  TM Distance: >3 FB Neck ROM: Full    Dental   Pulmonary asthma , former smoker,    breath sounds clear to auscultation       Cardiovascular hypertension, Pt. on medications + CAD, + Past MI and + CABG   Rhythm:Regular Rate:Normal     Neuro/Psych negative neurological ROS     GI/Hepatic Neg liver ROS, GERD  ,  Endo/Other  diabetes, Type 2  Renal/GU Renal diseaseS/p renal transplant     Musculoskeletal   Abdominal   Peds  Hematology  (+) anemia ,   Anesthesia Other Findings   Reproductive/Obstetrics                            Anesthesia Physical Anesthesia Plan  ASA: III  Anesthesia Plan: General and Regional   Post-op Pain Management: GA combined w/ Regional for post-op pain   Induction: Intravenous  Airway Management Planned: LMA  Additional Equipment:   Intra-op Plan:   Post-operative Plan: Extubation in OR  Informed Consent: I have reviewed the patients History and Physical, chart, labs and discussed the procedure including the risks, benefits and alternatives for the proposed anesthesia with the patient or authorized representative who has indicated his/her understanding and acceptance.   Dental advisory given  Plan Discussed with: CRNA  Anesthesia Plan Comments:         Anesthesia Quick Evaluation

## 2016-04-06 NOTE — Transfer of Care (Signed)
Immediate Anesthesia Transfer of Care Note  Patient: [name]  Procedure(s) Performed: Procedure(s): OPEN REDUCTION INTERNAL (ORIF) LEFT  PATELLA (Left)  Patient Location: PACU  Anesthesia Type:General  Level of Consciousness: awake  Airway & Oxygen Therapy: Patient Spontanous Breathing and Patient connected to face mask oxygen  Post-op Assessment: Report given to RN and Post -op Vital signs reviewed and stable  Post vital signs: Reviewed and stable  Last Vitals:  Filed Vitals:   04/06/16 1147 04/06/16 1230  BP: 158/71 155/78  Pulse: 89   Temp: 37 C   Resp: 15     Last Pain:  Filed Vitals:   04/06/16 1231  PainSc: 9       Patients Stated Pain Goal: 3 ([id number])  Complications: No apparent anesthesia complications

## 2016-04-06 NOTE — Progress Notes (Signed)
Assisted Dr. Rodman Comp with left, ultrasound guided, femoral nerve block. Side rails up, monitors on throughout procedure. See vital signs in flow sheet. Tolerated Procedure well.

## 2016-04-06 NOTE — Discharge Instructions (Signed)
Weight bearing as tolerated but must wear knee immobilizer at all times.  Do not remove bandages.  Do not get bandages wet.  May apply ice for up to 20 minutes at a time for pain and swelling.  Follow up appointment in one week.    SEEK MEDICAL CARE IF: You have swelling of your calf or leg.  You develop shortness of breath or chest pain.  You have redness, swelling, or increasing pain in the wound.  There is pus or any unusual drainage coming from the surgical site.  You notice a bad smell coming from the surgical site or dressing.  The surgical site breaks open after sutures or staples have been removed.  There is persistent bleeding from the suture or staple line.  You are getting worse or are not improving.  You have any other questions or concerns.  SEEK IMMEDIATE MEDICAL CARE IF:  You have a fever.  You develop a rash.  You have difficulty breathing.  You develop any reaction or side effects to medicines given.  Your knee motion is decreasing rather than improving.  MAKE SURE YOU:  Understand these instructions.  Will watch your condition.  Will get help right away if you are not doing well or get worse.    Post Anesthesia Home Care Instructions  Activity: Get plenty of rest for the remainder of the day. A responsible adult should stay with you for 24 hours following the procedure.  For the next 24 hours, DO NOT: -Drive a car -Paediatric nurse -Drink alcoholic beverages -Take any medication unless instructed by your physician -Make any legal decisions or sign important papers.  Meals: Start with liquid foods such as gelatin or soup. Progress to regular foods as tolerated. Avoid greasy, spicy, heavy foods. If nausea and/or vomiting occur, drink only clear liquids until the nausea and/or vomiting subsides. Call your physician if vomiting continues.  Special Instructions/Symptoms: Your throat may feel dry or sore from the anesthesia or the breathing tube placed in your  throat during surgery. If this causes discomfort, gargle with warm salt water. The discomfort should disappear within 24 hours.  If you had a scopolamine patch placed behind your ear for the management of post- operative nausea and/or vomiting:  1. The medication in the patch is effective for 72 hours, after which it should be removed.  Wrap patch in a tissue and discard in the trash. Wash hands thoroughly with soap and water. 2. You may remove the patch earlier than 72 hours if you experience unpleasant side effects which may include dry mouth, dizziness or visual disturbances. 3. Avoid touching the patch. Wash your hands with soap and water after contact with the patch.   Regional Anesthesia Blocks  1. Numbness or the inability to move the "blocked" extremity may last from 3-48 hours after placement. The length of time depends on the medication injected and your individual response to the medication. If the numbness is not going away after 48 hours, call your surgeon.  2. The extremity that is blocked will need to be protected until the numbness is gone and the  Strength has returned. Because you cannot feel it, you will need to take extra care to avoid injury. Because it may be weak, you may have difficulty moving it or using it. You may not know what position it is in without looking at it while the block is in effect.  3. For blocks in the legs and feet, returning to weight bearing and  walking needs to be done carefully. You will need to wait until the numbness is entirely gone and the strength has returned. You should be able to move your leg and foot normally before you try and bear weight or walk. You will need someone to be with you when you first try to ensure you do not fall and possibly risk injury.  4. Bruising and tenderness at the needle site are common side effects and will resolve in a few days.  5. Persistent numbness or new problems with movement should be communicated to the  surgeon or the [facility] [phone] [place] [phone]).

## 2016-04-06 NOTE — Anesthesia Procedure Notes (Addendum)
Anesthesia Regional Block:  Femoral nerve block  Pre-Anesthetic Checklist: ,, timeout performed, Correct Patient, Correct Site, Correct Laterality, Correct Procedure, Correct Position, site marked, Risks and benefits discussed,  Surgical consent,  Pre-op evaluation,  At surgeon's request and post-op pain management  Laterality: Left  Prep: chloraprep       Needles:  Injection technique: Single-shot  Needle Type: Echogenic Stimulator Needle     Needle Length: 9cm 9 cm Needle Gauge: 21 and 21 G    Additional Needles:  Procedures: ultrasound guided (picture in chart) and nerve stimulator Femoral nerve block  Nerve Stimulator or Paresthesia:  Response: quadraceps contraction, 0.45 mA,   Additional Responses:   Narrative:  Start time: 04/06/2016 12:15 PM End time: 04/06/2016 12:22 PM Injection made incrementally with aspirations every 5 mL.  Performed by: Personally  Anesthesiologist: Suzette Battiest  Additional Notes:      Procedure Name: LMA Insertion Date/Time: 04/06/2016 12:39 PM Performed by: Lieutenant Diego Pre-anesthesia Checklist: Patient identified, Emergency Drugs available, Suction available and Patient being monitored Patient Re-evaluated:Patient Re-evaluated prior to inductionOxygen Delivery Method: Circle System Utilized Preoxygenation: Pre-oxygenation with 100% oxygen Intubation Type: IV induction Ventilation: Mask ventilation without difficulty LMA: LMA inserted LMA Size: 4.0 Number of attempts: 1 Airway Equipment and Method: Bite block Placement Confirmation: positive ETCO2 and breath sounds checked- equal and bilateral Tube secured with: Tape Dental Injury: Teeth and Oropharynx as per pre-operative assessment

## 2016-04-07 ENCOUNTER — Encounter (HOSPITAL_BASED_OUTPATIENT_CLINIC_OR_DEPARTMENT_OTHER): Payer: Self-pay | Admitting: Orthopedic Surgery

## 2016-04-10 NOTE — Op Note (Deleted)
NAMEMarland Kitchen  [name], [name] NO.:  [id number]  MEDICAL RECORD NO.:  [id number]  LOCATION:                               FACILITY:  [facility]  PHYSICIAN:  Ninetta Lights, M.D. DATE OF BIRTH:  06/04/1941  DATE OF PROCEDURE:  04/06/2016 DATE OF DISCHARGE:                              OPERATIVE REPORT   PREOPERATIVE DIAGNOSIS:  Displaced primarily 2-part patella fracture, left knee through the junction of the middle and inferior 3rd just to the bottom of articular surface.  Displaced.  POSTOPERATIVE DIAGNOSIS:  Displaced primarily 2-part patella fracture, left knee through the junction of the middle and inferior 3rd just to the bottom of articular surface.  Displaced.  PROCEDURE: 1. Open reduction and internal fixation, patella fracture. 2. A 4.0 cannulated screws with a figure-OF-eight FiberWire suture     through the cannulated screws.  SURGEON:  Ninetta Lights, MD  ASSISTANT:  Elmyra Ricks, PA, present throughout the entire case and necessary for timely completion of procedure.  ANESTHESIA:  General.  BLOOD LOSS:  Minimal.  SPECIMENS:  None.  CULTURES:  None.  COMPLICATIONS:  None.  DRESSINGS:  Soft compressive knee immobilizer.  TOURNIQUET TIME:  45 minutes.  DESCRIPTION OF PROCEDURE:  The patient was brought to the operating room and after adequate anesthesia had been obtained, tourniquet applied, prepped and draped in usual sterile fashion.  Exsanguinated with elevation of Esmarch.  Tourniquet inflated to 350 mmHg.  Straight incision above the patella down towards the tibial tubercle.  Skin and subcutaneous tissue divided.  Obvious fracture identified.  Hematoma drained.  Thoroughly irrigated.  Tearing of the retinaculum on both sides, but not extensive.  Able to reduce this anatomically.  A little bit of comminution, but not bad.  Fluoroscopic guidance.  Two guidewires and two 4.0 screws from the bottom towards the top.  I did change one  of these, it was little long.  Once I was pleased with alignment and fixation, I did figure-of-eight FiberWire through the screws and then tying over the front of patella.  Nice solid stable fixation, anatomic alignment.  I could flex to 90 degrees without any motion of the fracture site.  Wound irrigated.  Subcutaneous and subcuticular closure.  Margins were injected with Marcaine.  Sterile compressive dressing applied.  Tourniquet deflated and removed.  Knee immobilizer applied.  Anesthesia reversed.  Brought to the recovery room.  Tolerated the surgery well, no complications.     Ninetta Lights, M.D.   ______________________________ Ninetta Lights, M.D.    DFM/MEDQ  D:  04/10/2016  T:  04/10/2016  Job:  [id number]

## 2016-04-10 NOTE — Op Note (Signed)
NAMEMarland Kitchen  [name], [name] NO.:  [id number]  MEDICAL RECORD NO.:  [id number]  LOCATION:                               FACILITY:  [facility]  PHYSICIAN:  Ninetta Lights, M.D. DATE OF BIRTH:  1941/02/10  DATE OF PROCEDURE:  04/06/2016 DATE OF DISCHARGE:                              OPERATIVE REPORT   PREOPERATIVE DIAGNOSIS:  Displaced primarily 2-part patella fracture, left knee through the junction of the middle and inferior 3rd just to the bottom of articular surface.  Displaced.  POSTOPERATIVE DIAGNOSIS:  Displaced primarily 2-part patella fracture, left knee through the junction of the middle and inferior 3rd just to the bottom of articular surface.  Displaced.  PROCEDURE: 1. Open reduction and internal fixation, patella fracture. 2. A 4.0 cannulated screws with a figure-OF-eight FiberWire suture     through the cannulated screws.  SURGEON:  Ninetta Lights, MD  ASSISTANT:  Elmyra Ricks, PA, present throughout the entire case and necessary for timely completion of procedure.  ANESTHESIA:  General.  BLOOD LOSS:  Minimal.  SPECIMENS:  None.  CULTURES:  None.  COMPLICATIONS:  None.  DRESSINGS:  Soft compressive knee immobilizer.  TOURNIQUET TIME:  45 minutes.  DESCRIPTION OF PROCEDURE:  The patient was brought to the operating room and after adequate anesthesia had been obtained, tourniquet applied, prepped and draped in usual sterile fashion.  Exsanguinated with elevation of Esmarch.  Tourniquet inflated to 350 mmHg.  Straight incision above the patella down towards the tibial tubercle.  Skin and subcutaneous tissue divided.  Obvious fracture identified.  Hematoma drained.  Thoroughly irrigated.  Tearing of the retinaculum on both sides, but not extensive.  Able to reduce this anatomically.  A little bit of comminution, but not bad.  Fluoroscopic guidance.  Two guidewires and two 4.0 screws from the bottom towards the top.  I did change one  of these, it was little long.  Once I was pleased with alignment and fixation, I did figure-of-eight FiberWire through the screws and then tying over the front of patella.  Nice solid stable fixation, anatomic alignment.  I could flex to 90 degrees without any motion of the fracture site.  Wound irrigated.  Subcutaneous and subcuticular closure.  Margins were injected with Marcaine.  Sterile compressive dressing applied.  Tourniquet deflated and removed.  Knee immobilizer applied.  Anesthesia reversed.  Brought to the recovery room.  Tolerated the surgery well, no complications.     Ninetta Lights, M.D.   ______________________________ Ninetta Lights, M.D.    DFM/MEDQ  D:  04/10/2016  T:  04/10/2016  Job:  [id number]

## 2016-06-13 ENCOUNTER — Other Ambulatory Visit (INDEPENDENT_AMBULATORY_CARE_PROVIDER_SITE_OTHER): Payer: Medicare Other | Admitting: *Deleted

## 2016-06-13 DIAGNOSIS — E785 Hyperlipidemia, unspecified: Secondary | ICD-10-CM

## 2016-06-13 LAB — LIPID PANEL
CHOL/HDL RATIO: 2.8 ratio (ref <=5.0)
CHOLESTEROL: 197 mg/dL (ref 125–200)
HDL: 71 mg/dL (ref >=46)
LDL Cholesterol: 97 mg/dL (ref <130)
TRIGLYCERIDES: 146 mg/dL (ref <150)
VLDL: 29 mg/dL (ref <30)

## 2016-06-13 LAB — COMPREHENSIVE METABOLIC PANEL
ALBUMIN: 4.3 g/dL (ref 3.6–5.1)
ALT: 6 U/L (ref 6–29)
AST: 13 U/L (ref 10–35)
Alkaline Phosphatase: 61 U/L (ref 33–130)
BILIRUBIN TOTAL: 0.6 mg/dL (ref 0.2–1.2)
BUN: 22 mg/dL (ref 7–25)
CALCIUM: 9.4 mg/dL (ref 8.6–10.4)
CO2: 26 mmol/L (ref 20–31)
Chloride: 106 mmol/L (ref 98–110)
Creat: 1.1 mg/dL — ABNORMAL HIGH (ref 0.60–0.93)
Glucose, Bld: 84 mg/dL (ref 65–99)
POTASSIUM: 3.8 mmol/L (ref 3.5–5.3)
Sodium: 144 mmol/L (ref 135–146)
Total Protein: 6.5 g/dL (ref 6.1–8.1)

## 2016-06-13 NOTE — Addendum Note (Signed)
Addended by: Andres Ege on: 06/13/2016 08:25 AM   Modules accepted: Orders

## 2016-06-15 ENCOUNTER — Telehealth: Payer: Self-pay | Admitting: Nurse Practitioner

## 2016-06-15 DIAGNOSIS — E785 Hyperlipidemia, unspecified: Secondary | ICD-10-CM

## 2016-06-15 MED ORDER — ROSUVASTATIN CALCIUM 10 MG PO TABS: 5.0000 mg | ORAL_TABLET | Freq: Every day | ORAL | 11 refills | Status: DC

## 2016-06-15 NOTE — Telephone Encounter (Signed)
Reviewed results of lab work with patient who states she has been taking 5 mg of Crestor 3 days per week.  She is willing to increase to 7 days per week.  I have scheduled her for repeat lab work on 10/31 and advised her to call back with questions or concerns prior to that appointment.  She verbalized understanding and agreement.

## 2016-07-28 ENCOUNTER — Encounter (HOSPITAL_BASED_OUTPATIENT_CLINIC_OR_DEPARTMENT_OTHER): Payer: Self-pay | Admitting: *Deleted

## 2016-07-31 ENCOUNTER — Encounter (HOSPITAL_BASED_OUTPATIENT_CLINIC_OR_DEPARTMENT_OTHER): Payer: Self-pay | Admitting: *Deleted

## 2016-08-01 ENCOUNTER — Encounter (HOSPITAL_BASED_OUTPATIENT_CLINIC_OR_DEPARTMENT_OTHER): Payer: Self-pay | Admitting: Physician Assistant

## 2016-08-01 ENCOUNTER — Encounter (HOSPITAL_BASED_OUTPATIENT_CLINIC_OR_DEPARTMENT_OTHER)
Admission: RE | Admit: 2016-08-01 | Discharge: 2016-08-01 | Disposition: A | Discharge status: Home / Self Care | Payer: Medicare Other | Source: Ambulatory Visit | Attending: Orthopedic Surgery | Admitting: Orthopedic Surgery

## 2016-08-01 DIAGNOSIS — Z7952 Long term (current) use of systemic steroids: Secondary | ICD-10-CM | POA: Diagnosis not present

## 2016-08-01 DIAGNOSIS — E785 Hyperlipidemia, unspecified: Secondary | ICD-10-CM | POA: Diagnosis not present

## 2016-08-01 DIAGNOSIS — M7542 Impingement syndrome of left shoulder: Secondary | ICD-10-CM

## 2016-08-01 DIAGNOSIS — M810 Age-related osteoporosis without current pathological fracture: Secondary | ICD-10-CM | POA: Diagnosis not present

## 2016-08-01 DIAGNOSIS — Z8614 Personal history of Methicillin resistant Staphylococcus aureus infection: Secondary | ICD-10-CM | POA: Diagnosis not present

## 2016-08-01 DIAGNOSIS — I2510 Atherosclerotic heart disease of native coronary artery without angina pectoris: Secondary | ICD-10-CM | POA: Diagnosis not present

## 2016-08-01 DIAGNOSIS — K219 Gastro-esophageal reflux disease without esophagitis: Secondary | ICD-10-CM | POA: Diagnosis not present

## 2016-08-01 DIAGNOSIS — M109 Gout, unspecified: Secondary | ICD-10-CM | POA: Diagnosis not present

## 2016-08-01 DIAGNOSIS — Z940 Kidney transplant status: Secondary | ICD-10-CM | POA: Diagnosis not present

## 2016-08-01 DIAGNOSIS — D649 Anemia, unspecified: Secondary | ICD-10-CM | POA: Diagnosis not present

## 2016-08-01 DIAGNOSIS — Z8719 Personal history of other diseases of the digestive system: Secondary | ICD-10-CM | POA: Diagnosis not present

## 2016-08-01 DIAGNOSIS — Z87891 Personal history of nicotine dependence: Secondary | ICD-10-CM | POA: Diagnosis not present

## 2016-08-01 DIAGNOSIS — N186 End stage renal disease: Secondary | ICD-10-CM | POA: Diagnosis not present

## 2016-08-01 DIAGNOSIS — M19012 Primary osteoarthritis, left shoulder: Secondary | ICD-10-CM | POA: Diagnosis not present

## 2016-08-01 DIAGNOSIS — Z96642 Presence of left artificial hip joint: Secondary | ICD-10-CM | POA: Diagnosis not present

## 2016-08-01 DIAGNOSIS — M75122 Complete rotator cuff tear or rupture of left shoulder, not specified as traumatic: Secondary | ICD-10-CM | POA: Diagnosis present

## 2016-08-01 DIAGNOSIS — I120 Hypertensive chronic kidney disease with stage 5 chronic kidney disease or end stage renal disease: Secondary | ICD-10-CM | POA: Diagnosis not present

## 2016-08-01 DIAGNOSIS — E213 Hyperparathyroidism, unspecified: Secondary | ICD-10-CM | POA: Diagnosis not present

## 2016-08-01 DIAGNOSIS — J45909 Unspecified asthma, uncomplicated: Secondary | ICD-10-CM | POA: Diagnosis not present

## 2016-08-01 DIAGNOSIS — E739 Lactose intolerance, unspecified: Secondary | ICD-10-CM | POA: Diagnosis not present

## 2016-08-01 DIAGNOSIS — D696 Thrombocytopenia, unspecified: Secondary | ICD-10-CM | POA: Diagnosis not present

## 2016-08-01 DIAGNOSIS — Z992 Dependence on renal dialysis: Secondary | ICD-10-CM | POA: Diagnosis not present

## 2016-08-01 DIAGNOSIS — I252 Old myocardial infarction: Secondary | ICD-10-CM | POA: Diagnosis not present

## 2016-08-01 DIAGNOSIS — E1122 Type 2 diabetes mellitus with diabetic chronic kidney disease: Secondary | ICD-10-CM | POA: Diagnosis not present

## 2016-08-01 HISTORY — DX: Primary osteoarthritis, left shoulder: M19.012

## 2016-08-01 HISTORY — DX: Impingement syndrome of left shoulder: M75.42

## 2016-08-01 LAB — BASIC METABOLIC PANEL
ANION GAP: 9 (ref 5–15)
BUN: 20 mg/dL (ref 6–20)
CHLORIDE: 104 mmol/L (ref 101–111)
CO2: 29 mmol/L (ref 22–32)
Calcium: 10.5 mg/dL — ABNORMAL HIGH (ref 8.9–10.3)
Creatinine, Ser: 1.13 mg/dL — ABNORMAL HIGH (ref 0.44–1.00)
GFR calc non Af Amer: 47 mL/min — ABNORMAL LOW (ref >60)
GFR, EST AFRICAN AMERICAN: 54 mL/min — AB (ref >60)
Glucose, Bld: 91 mg/dL (ref 65–99)
Potassium: 3.9 mmol/L (ref 3.5–5.1)
Sodium: 142 mmol/L (ref 135–145)

## 2016-08-01 NOTE — H&P (Signed)
[name] is an 75 y.o. female.   Chief Complaint: left shoulder pain HPI: This is a pleasant 75 year old female who presents to our clinic today to discuss MRI result of her left shoulder. Initial injury was from a fall on Mother's Day. We saw her back on 07-04-2016 where we proceeded with a subacromial injection. This has helped up until recently. MRI was taken on 07-08-2016, which showed a full thickness, nearly complete, insertional tear of supra and infraspinatus tendons with moderate retraction and mild associated muscular atrophy.   Past Medical History:  Diagnosis Date  . 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 Asthma   . Coronary artery disease    cath 11/2007, grafts patent /  Nuclear, June, 2011, prior inferior MI with mild peri-infarct ischemia, anterior breast attenuation, EF 67%, done for renal transplant assessment / Low level exercise/Lexiscan Myoview (11/2013): EF 67%, no ischemi; normal study  . Diabetes mellitus    type 2  . ESRD on dialysis [facility])    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, 2012  . GERD (gastroesophageal reflux disease)   . GI bleed 11/26/2010   January, 2012 , AVM  . Gout   . History of methicillin resistant staphylococcus aureus (MRSA)   . Hyperlipidemia   . Hyperparathyroidism   . Hypertension   . Myocardial infarction ([place])   . Osteoporosis   . Pneumonia 08/2010    Hospitalization, October, 2011  . Primary osteoarthritis, 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  . S/P kidney transplant    September, 2012, [facility]  . Subacromial impingement of 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    Past Surgical History:  Procedure Laterality Date  . ABDOMINAL HYSTERECTOMY  1980'S  . ARTERIOVENOUS GRAFT PLACEMENT Left 03/04/2007   forearm  . BREAST SURGERY  YRS AGO   RT BREAST CYST REMOVED   . CARDIAC CATHETERIZATION  06/03/2002; 12/04/2007  . COLONOSCOPY WITH PROPOFOL N/A 07/14/2014   Procedure: COLONOSCOPY WITH  PROPOFOL;  Surgeon: Lear Ng, MD;  Location: [facility] ENDOSCOPY;  Service: Endoscopy;  Laterality: N/A;  . CORONARY ARTERY BYPASS GRAFT  06/06/2002   x 4  . HOT HEMOSTASIS N/A 07/14/2014   Procedure: HOT HEMOSTASIS (ARGON PLASMA COAGULATION/BICAP);  Surgeon: Lear Ng, MD;  Location: [facility];  Service: Endoscopy;  Laterality: N/A;  . KIDNEY TRANSPLANT Right 08/04/2011  . ORIF PATELLA Left 04/06/2016   Procedure: OPEN REDUCTION INTERNAL (ORIF) LEFT  PATELLA;  Surgeon: Ninetta Lights, MD;  Location: [facility];  Service: Orthopedics;  Laterality: Left;  . THROMBECTOMY AND REVISION OF ARTERIOVENTOUS (AV) GORETEX  GRAFT Left 05/08/2007   forearm  . TOTAL HIP ARTHROPLASTY  12/18/2012   Procedure: TOTAL HIP ARTHROPLASTY;  Surgeon: Ninetta Lights, MD;  Location: [facility];  Service: Orthopedics;  Laterality: Left;  [facility] VESICOVAGINAL FISTULA CLOSURE W/ TAH  1984    Family History  Problem Relation Age of Onset  . Cancer Neg Hx    Social History:  reports that she quit smoking about 32 years ago. Her smoking use included Cigarettes. She has a 8.00 pack-year smoking history. She has never used smokeless tobacco. She reports that she does not drink alcohol or use drugs.  Allergies:  Allergies  Allergen Reactions  . Lactose Other (See Comments)  . Asa Arthritis Strength-Antacid [Aspirin Buffered] Other (See Comments)    STOMACH BURNS, N/V   . Aspirin Nausea And Vomiting  Per pt. "can tolerate the enteric coated tablets".   . Atorvastatin Other (See Comments)    Patient's skin was skin was sensitive  . Banana Nausea And Vomiting  . Lactose Intolerance (Gi) Nausea And Vomiting  . Lisinopril Cough    No current facility-administered medications for this encounter.   Current Outpatient Prescriptions:  .  amLODipine (NORVASC) 10 MG tablet, Take 10 mg by mouth every morning. , Disp: , Rfl:  .  Azelastine HCl 0.15 % SOLN, Place 1 spray into the nose 2 (two) times  daily., Disp: , Rfl:  .  calcitRIOL (ROCALTROL) 0.25 MCG capsule, Take 0.5-0.75 mcg by mouth See admin instructions. Alternate 2 tablets one day, 3 tablets the next day., Disp: , Rfl:  .  Cholecalciferol (VITAMIN D3) 5000 UNITS TABS, Take 1 tablet by mouth daily., Disp: , Rfl:  .  fluticasone (FLONASE) 50 MCG/ACT nasal spray, Place 1 spray into both nostrils 2 (two) times daily., Disp: , Rfl:  .  glipiZIDE (GLUCOTROL) 5 MG tablet, Take 5 mg by mouth daily before breakfast. , Disp: , Rfl:  .  omeprazole (PRILOSEC) 20 MG capsule, Take 20 mg by mouth daily. , Disp: , Rfl:  .  predniSONE (DELTASONE) 5 MG tablet, Take 5 mg by mouth daily. Reported on 03/07/2016, Disp: , Rfl:  .  rosuvastatin (CRESTOR) 10 MG tablet, Take 0.5 tablets (5 mg total) by mouth daily., Disp: 30 tablet, Rfl: 11 .  tacrolimus (PROGRAF) 1 MG capsule, Take 6 mg by mouth every 12 (twelve) hours., Disp: , Rfl:  .  aspirin EC 81 MG tablet, Take 81 mg by mouth daily., Disp: , Rfl:  .  nitroGLYCERIN (NITROSTAT) 0.4 MG SL tablet, Place 1 tablet (0.4 mg total) under the tongue every 5 (five) minutes as needed for chest pain., Disp: 25 tablet, Rfl: 6 .  ondansetron (ZOFRAN) 4 MG tablet, Take 1 tablet (4 mg total) by mouth every 8 (eight) hours as needed for nausea or vomiting., Disp: 40 tablet, Rfl: 0  No results found for this or any previous visit (from the past 48 hour(s)). No results found.  Review of Systems  Constitutional: Negative.   HENT: Negative.   Eyes: Negative.   Respiratory: Negative.   Cardiovascular: Negative.   Gastrointestinal: Negative.   Genitourinary: Negative.   Musculoskeletal: Positive for joint pain.  Skin: Negative.   Neurological: Negative.   Endo/Heme/Allergies: Negative.   Psychiatric/Behavioral: Negative.     Height 5\' 2"  (1.575 m), weight 70.3 kg (155 lb). Physical Exam  Constitutional: She is oriented to person, place, and time. She appears well-developed and well-nourished.  HENT:  Head:  Normocephalic and atraumatic.  Eyes: Pupils are equal, round, and reactive to light.  Neck: Neck supple.  Cardiovascular: Normal rate.   Respiratory: Effort normal.  GI: Soft.  Genitourinary:  Genitourinary Comments: Not pertinent to current symptomatology therefore not examined.  Musculoskeletal:  Well-developed, well-nourished female in no acute distress.  Alert and oriented x 3.  Examination of her left shoulder reveals 50% active range of motion without pain; however, she can get all the way. Although this is full range of motion, this is with pain. She is neurovascularly intact distally.   Neurological: She is oriented to person, place, and time.  Skin: Skin is dry.  Psychiatric: She has a normal mood and affect.     Assessment Principal Problem:   Complete rotator cuff tear of left shoulder Active Problems:   THROMBOCYTOPENIA   VERTIGO   Coronary artery  disease   Hypertension   Hx of CABG   S/P kidney transplant   History of methicillin resistant staphylococcus aureus (MRSA)   Subacromial impingement of left shoulder   Primary osteoarthritis, left shoulder   Plan At this point, we feel as though we can go ahead and proceed with a left shoulder arthroscopic decompression and rotator cuff repair. We are cautiously optimistic; however, based on the fact that there is some atrophy. If the rotator cuff, however, is irreparable, at the end of the day, this will come to a reverse total shoulder replacement.  Linda Hedges, PA-C 08/01/2016, 10:24 AM

## 2016-08-02 NOTE — Anesthesia Preprocedure Evaluation (Addendum)
Anesthesia Evaluation  Patient identified by MRN, date of birth, ID band Patient awake    Reviewed: Allergy & Precautions, NPO status , Patient's Chart, lab work & pertinent test results  History of Anesthesia Complications Negative for: history of anesthetic complications  Airway Mallampati: III  TM Distance: >3 FB Neck ROM: Full    Dental  (+) Upper Dentures, Lower Dentures   Pulmonary neg shortness of breath, asthma (last needed inhaler >1 month ago) , neg sleep apnea, neg COPD, neg recent URI, former smoker,    Pulmonary exam normal breath sounds clear to auscultation       Cardiovascular hypertension, Pt. on medications (-) angina+ CAD, + Past MI and + CABG (x4, 06/06/2002)  (-) Orthopnea  Rhythm:Regular Rate:Normal  HLD  TTE 10/27/2015: Normal LVEF Mild RV systolic dysfunction Mild AI, trivial MR, mild PI, trivial TR No valvular stenosis  Stress Test 12/01/2013: normal   Neuro/Psych negative neurological ROS     GI/Hepatic GERD  Medicated and Controlled,  Endo/Other  diabetes, Type 2, Oral Hypoglycemic Agentshyperparathyroidism  Renal/GU Renal disease (s/p renal transplant 07/2011)     Musculoskeletal  (+) Arthritis , Osteoarthritis,  osteoporosis   Abdominal   Peds  Hematology  (+) Blood dyscrasia, anemia ,   Anesthesia Other Findings gout  Reproductive/Obstetrics                            Anesthesia Physical Anesthesia Plan  ASA: III  Anesthesia Plan: General and Regional   Post-op Pain Management: GA combined w/ Regional for post-op pain   Induction: Intravenous  Airway Management Planned: Oral ETT  Additional Equipment:   Intra-op Plan:   Post-operative Plan: Extubation in OR  Informed Consent: I have reviewed the patients History and Physical, chart, labs and discussed the procedure including the risks, benefits and alternatives for the proposed anesthesia with  the patient or authorized representative who has indicated his/her understanding and acceptance.   Dental advisory given  Plan Discussed with: CRNA  Anesthesia Plan Comments: (Risks of general anesthesia discussed including, but not limited to, sore throat, hoarse voice, chipped/damaged teeth, injury to vocal cords, nausea and vomiting, allergic reactions, lung infection, heart attack, stroke, and death. All questions answered.  Discussed potential risks of nerve blocks including, but not limited to, infection, bleeding, nerve damage, seizures, pneumothorax, respiratory depression, and potential failure of the block. Alternatives to nerve blocks discussed. All questions answered. )       Anesthesia Quick Evaluation

## 2016-08-03 ENCOUNTER — Encounter (HOSPITAL_BASED_OUTPATIENT_CLINIC_OR_DEPARTMENT_OTHER): Payer: Self-pay | Admitting: *Deleted

## 2016-08-03 ENCOUNTER — Ambulatory Visit (HOSPITAL_BASED_OUTPATIENT_CLINIC_OR_DEPARTMENT_OTHER): Payer: Medicare Other | Admitting: Anesthesiology

## 2016-08-03 ENCOUNTER — Encounter (HOSPITAL_BASED_OUTPATIENT_CLINIC_OR_DEPARTMENT_OTHER)
Admission: RE | Disposition: A | Discharge status: Home / Self Care | Payer: Self-pay | Source: Ambulatory Visit | Attending: Orthopedic Surgery

## 2016-08-03 ENCOUNTER — Ambulatory Visit (HOSPITAL_BASED_OUTPATIENT_CLINIC_OR_DEPARTMENT_OTHER)
Admission: RE | Admit: 2016-08-03 | Discharge: 2016-08-03 | Disposition: A | Discharge status: Home / Self Care | Payer: Medicare Other | Source: Ambulatory Visit | Attending: Orthopedic Surgery | Admitting: Orthopedic Surgery

## 2016-08-03 DIAGNOSIS — M75122 Complete rotator cuff tear or rupture of left shoulder, not specified as traumatic: Secondary | ICD-10-CM | POA: Diagnosis not present

## 2016-08-03 DIAGNOSIS — E785 Hyperlipidemia, unspecified: Secondary | ICD-10-CM | POA: Insufficient documentation

## 2016-08-03 DIAGNOSIS — M19012 Primary osteoarthritis, left shoulder: Secondary | ICD-10-CM | POA: Diagnosis not present

## 2016-08-03 DIAGNOSIS — E213 Hyperparathyroidism, unspecified: Secondary | ICD-10-CM | POA: Insufficient documentation

## 2016-08-03 DIAGNOSIS — Z940 Kidney transplant status: Secondary | ICD-10-CM

## 2016-08-03 DIAGNOSIS — D696 Thrombocytopenia, unspecified: Secondary | ICD-10-CM | POA: Diagnosis present

## 2016-08-03 DIAGNOSIS — I2510 Atherosclerotic heart disease of native coronary artery without angina pectoris: Secondary | ICD-10-CM | POA: Diagnosis present

## 2016-08-03 DIAGNOSIS — R42 Dizziness and giddiness: Secondary | ICD-10-CM

## 2016-08-03 DIAGNOSIS — Z992 Dependence on renal dialysis: Secondary | ICD-10-CM | POA: Insufficient documentation

## 2016-08-03 DIAGNOSIS — I120 Hypertensive chronic kidney disease with stage 5 chronic kidney disease or end stage renal disease: Secondary | ICD-10-CM | POA: Insufficient documentation

## 2016-08-03 DIAGNOSIS — Z7982 Long term (current) use of aspirin: Secondary | ICD-10-CM | POA: Insufficient documentation

## 2016-08-03 DIAGNOSIS — Z951 Presence of aortocoronary bypass graft: Secondary | ICD-10-CM

## 2016-08-03 DIAGNOSIS — I252 Old myocardial infarction: Secondary | ICD-10-CM | POA: Insufficient documentation

## 2016-08-03 DIAGNOSIS — Z87891 Personal history of nicotine dependence: Secondary | ICD-10-CM | POA: Insufficient documentation

## 2016-08-03 DIAGNOSIS — M109 Gout, unspecified: Secondary | ICD-10-CM | POA: Insufficient documentation

## 2016-08-03 DIAGNOSIS — N186 End stage renal disease: Secondary | ICD-10-CM | POA: Insufficient documentation

## 2016-08-03 DIAGNOSIS — Z8614 Personal history of Methicillin resistant Staphylococcus aureus infection: Secondary | ICD-10-CM | POA: Diagnosis present

## 2016-08-03 DIAGNOSIS — E1122 Type 2 diabetes mellitus with diabetic chronic kidney disease: Secondary | ICD-10-CM | POA: Insufficient documentation

## 2016-08-03 DIAGNOSIS — Z7952 Long term (current) use of systemic steroids: Secondary | ICD-10-CM | POA: Insufficient documentation

## 2016-08-03 DIAGNOSIS — M7542 Impingement syndrome of left shoulder: Secondary | ICD-10-CM | POA: Diagnosis not present

## 2016-08-03 DIAGNOSIS — Z96642 Presence of left artificial hip joint: Secondary | ICD-10-CM | POA: Insufficient documentation

## 2016-08-03 DIAGNOSIS — D649 Anemia, unspecified: Secondary | ICD-10-CM | POA: Insufficient documentation

## 2016-08-03 DIAGNOSIS — Z8719 Personal history of other diseases of the digestive system: Secondary | ICD-10-CM | POA: Insufficient documentation

## 2016-08-03 DIAGNOSIS — M810 Age-related osteoporosis without current pathological fracture: Secondary | ICD-10-CM | POA: Insufficient documentation

## 2016-08-03 DIAGNOSIS — K219 Gastro-esophageal reflux disease without esophagitis: Secondary | ICD-10-CM | POA: Insufficient documentation

## 2016-08-03 DIAGNOSIS — J45909 Unspecified asthma, uncomplicated: Secondary | ICD-10-CM | POA: Insufficient documentation

## 2016-08-03 DIAGNOSIS — E739 Lactose intolerance, unspecified: Secondary | ICD-10-CM | POA: Insufficient documentation

## 2016-08-03 DIAGNOSIS — I10 Essential (primary) hypertension: Secondary | ICD-10-CM | POA: Diagnosis present

## 2016-08-03 HISTORY — DX: Primary osteoarthritis, left shoulder: M19.012

## 2016-08-03 HISTORY — DX: Personal history of Methicillin resistant Staphylococcus aureus infection: Z86.14

## 2016-08-03 HISTORY — PX: SHOULDER ARTHROSCOPY WITH ROTATOR CUFF REPAIR AND SUBACROMIAL DECOMPRESSION: SHX5686

## 2016-08-03 HISTORY — DX: Impingement syndrome of left shoulder: M75.42

## 2016-08-03 LAB — GLUCOSE, CAPILLARY
GLUCOSE-CAPILLARY: 98 mg/dL (ref 65–99)
GLUCOSE-CAPILLARY: 171 mg/dL — AB (ref 65–99)

## 2016-08-03 SURGERY — SHOULDER ARTHROSCOPY WITH ROTATOR CUFF REPAIR AND SUBACROMIAL DECOMPRESSION
Anesthesia: Regional | Site: Shoulder | Laterality: Left

## 2016-08-03 MED ORDER — VANCOMYCIN HCL IN DEXTROSE 1-5 GM/200ML-% IV SOLN
1000.0000 mg | INTRAVENOUS | Status: AC
  Administered 2016-08-03: 1000 mg via INTRAVENOUS

## 2016-08-03 MED ORDER — ONDANSETRON HCL 4 MG/2ML IJ SOLN
INTRAMUSCULAR | Status: AC
  Filled 2016-08-03: qty 2

## 2016-08-03 MED ORDER — PHENYLEPHRINE HCL 10 MG/ML IJ SOLN
INTRAMUSCULAR | Status: DC | PRN
  Administered 2016-08-03: 40 ug via INTRAVENOUS
  Administered 2016-08-03 (×2): 80 ug via INTRAVENOUS

## 2016-08-03 MED ORDER — FENTANYL CITRATE (PF) 100 MCG/2ML IJ SOLN
50.0000 ug | INTRAMUSCULAR | Status: DC | PRN
  Administered 2016-08-03: 50 ug via INTRAVENOUS

## 2016-08-03 MED ORDER — GLYCOPYRROLATE 0.2 MG/ML IJ SOLN
0.2000 mg | Freq: Once | INTRAMUSCULAR | Status: AC | PRN
  Administered 2016-08-03: 0.2 mg via INTRAVENOUS

## 2016-08-03 MED ORDER — PHENYLEPHRINE 40 MCG/ML (10ML) SYRINGE FOR IV PUSH (FOR BLOOD PRESSURE SUPPORT)
PREFILLED_SYRINGE | INTRAVENOUS | Status: AC
  Filled 2016-08-03: qty 10

## 2016-08-03 MED ORDER — SUCCINYLCHOLINE CHLORIDE 200 MG/10ML IV SOSY
PREFILLED_SYRINGE | INTRAVENOUS | Status: AC
  Filled 2016-08-03: qty 10

## 2016-08-03 MED ORDER — LIDOCAINE HCL (CARDIAC) 20 MG/ML IV SOLN
INTRAVENOUS | Status: DC | PRN
  Administered 2016-08-03: 50 mg via INTRAVENOUS

## 2016-08-03 MED ORDER — SUCCINYLCHOLINE CHLORIDE 20 MG/ML IJ SOLN
INTRAMUSCULAR | Status: DC | PRN
  Administered 2016-08-03: 80 mg via INTRAVENOUS

## 2016-08-03 MED ORDER — SCOPOLAMINE 1 MG/3DAYS TD PT72: 1.0000 | MEDICATED_PATCH | Freq: Once | TRANSDERMAL | Status: DC | PRN

## 2016-08-03 MED ORDER — VANCOMYCIN HCL IN DEXTROSE 1-5 GM/200ML-% IV SOLN
INTRAVENOUS | Status: AC
  Filled 2016-08-03: qty 200

## 2016-08-03 MED ORDER — MIDAZOLAM HCL 2 MG/2ML IJ SOLN: 1.0000 mg | INTRAMUSCULAR | Status: DC | PRN

## 2016-08-03 MED ORDER — CHLORHEXIDINE GLUCONATE 4 % EX LIQD: 60.0000 mL | Freq: Once | CUTANEOUS | Status: DC

## 2016-08-03 MED ORDER — BUPIVACAINE-EPINEPHRINE (PF) 0.5% -1:200000 IJ SOLN
INTRAMUSCULAR | Status: DC | PRN
  Administered 2016-08-03: 30 mL via PERINEURAL

## 2016-08-03 MED ORDER — ONDANSETRON HCL 4 MG/2ML IJ SOLN: 4.0000 mg | Freq: Once | INTRAMUSCULAR | Status: DC | PRN

## 2016-08-03 MED ORDER — FENTANYL CITRATE (PF) 100 MCG/2ML IJ SOLN: 25.0000 ug | INTRAMUSCULAR | Status: DC | PRN

## 2016-08-03 MED ORDER — LACTATED RINGERS IV SOLN
INTRAVENOUS | Status: DC
  Administered 2016-08-03 (×2): via INTRAVENOUS

## 2016-08-03 MED ORDER — FENTANYL CITRATE (PF) 100 MCG/2ML IJ SOLN
INTRAMUSCULAR | Status: AC
  Filled 2016-08-03: qty 2

## 2016-08-03 MED ORDER — LIDOCAINE 2% (20 MG/ML) 5 ML SYRINGE
INTRAMUSCULAR | Status: AC
  Filled 2016-08-03: qty 5

## 2016-08-03 MED ORDER — LACTATED RINGERS IV SOLN: INTRAVENOUS | Status: DC

## 2016-08-03 MED ORDER — MIDAZOLAM HCL 2 MG/2ML IJ SOLN
INTRAMUSCULAR | Status: AC
  Filled 2016-08-03: qty 2

## 2016-08-03 MED ORDER — SODIUM CHLORIDE 0.9 % IR SOLN
Status: DC | PRN
  Administered 2016-08-03: 6000 mL

## 2016-08-03 MED ORDER — PROPOFOL 500 MG/50ML IV EMUL
INTRAVENOUS | Status: AC
  Filled 2016-08-03: qty 50

## 2016-08-03 MED ORDER — PROPOFOL 10 MG/ML IV BOLUS
INTRAVENOUS | Status: DC | PRN
  Administered 2016-08-03: 50 mg via INTRAVENOUS
  Administered 2016-08-03: 120 mg via INTRAVENOUS

## 2016-08-03 SURGICAL SUPPLY — 73 items
ANCH SUT SWLK 19.1X4.75 (Anchor) ×3 IMPLANT
ANCH SUT SWLK 19.1X5.5 CLS EL (Anchor) ×1 IMPLANT
ANCHOR PEEK SWIVEL LOCK 5.5 (Anchor) ×1 IMPLANT
ANCHOR SUT BIO SW 4.75X19.1 (Anchor) ×3 IMPLANT
APL SKNCLS STERI-STRIP NONHPOA (GAUZE/BANDAGES/DRESSINGS)
BENZOIN TINCTURE PRP APPL 2/3 (GAUZE/BANDAGES/DRESSINGS) IMPLANT
BLADE CUTTER GATOR 3.5 (BLADE) ×2 IMPLANT
BLADE CUTTER MENIS 5.5 (BLADE) IMPLANT
BLADE GREAT WHITE 4.2 (BLADE) ×2 IMPLANT
BLADE SURG 15 STRL LF DISP TIS (BLADE) IMPLANT
BLADE SURG 15 STRL SS (BLADE)
BUR OVAL 6.0 (BURR) ×2 IMPLANT
CANNULA DRY DOC 8X75 (CANNULA) IMPLANT
CANNULA TWIST IN 8.25X7CM (CANNULA) IMPLANT
DECANTER SPIKE VIAL GLASS SM (MISCELLANEOUS) IMPLANT
DRAPE STERI 35X30 U-POUCH (DRAPES) ×2 IMPLANT
DRAPE U-SHAPE 47X51 STRL (DRAPES) ×2 IMPLANT
DRAPE U-SHAPE 76X120 STRL (DRAPES) ×4 IMPLANT
DRSG PAD ABDOMINAL 8X10 ST (GAUZE/BANDAGES/DRESSINGS) ×2 IMPLANT
DURAPREP 26ML APPLICATOR (WOUND CARE) ×2 IMPLANT
ELECT MENISCUS 165MM 90D (ELECTRODE) ×2 IMPLANT
ELECT REM PT RETURN 9FT ADLT (ELECTROSURGICAL) ×2
ELECTRODE REM PT RTRN 9FT ADLT (ELECTROSURGICAL) ×1 IMPLANT
GAUZE SPONGE 4X4 12PLY STRL (GAUZE/BANDAGES/DRESSINGS) ×4 IMPLANT
GAUZE XEROFORM 1X8 LF (GAUZE/BANDAGES/DRESSINGS) ×2 IMPLANT
GLOVE BIOGEL PI IND STRL 7.0 (GLOVE) ×1 IMPLANT
GLOVE BIOGEL PI INDICATOR 7.0 (GLOVE) ×1
GLOVE ECLIPSE 7.0 STRL STRAW (GLOVE) ×2 IMPLANT
GLOVE SURG ORTHO 8.0 STRL STRW (GLOVE) ×2 IMPLANT
GOWN STRL REUS W/ TWL LRG LVL3 (GOWN DISPOSABLE) ×1 IMPLANT
GOWN STRL REUS W/ TWL XL LVL3 (GOWN DISPOSABLE) ×2 IMPLANT
GOWN STRL REUS W/TWL LRG LVL3 (GOWN DISPOSABLE) ×2
GOWN STRL REUS W/TWL XL LVL3 (GOWN DISPOSABLE) ×4
IV NS IRRIG 3000ML ARTHROMATIC (IV SOLUTION) ×8 IMPLANT
MANIFOLD NEPTUNE II (INSTRUMENTS) ×2 IMPLANT
NDL SCORPION MULTI FIRE (NEEDLE) IMPLANT
NDL SUT 6 .5 CRC .975X.05 MAYO (NEEDLE) IMPLANT
NEEDLE MAYO TAPER (NEEDLE)
NEEDLE SCORPION MULTI FIRE (NEEDLE) IMPLANT
NS IRRIG 1000ML POUR BTL (IV SOLUTION) IMPLANT
PACK ARTHROSCOPY DSU (CUSTOM PROCEDURE TRAY) ×2 IMPLANT
PACK BASIN DAY SURGERY FS (CUSTOM PROCEDURE TRAY) ×2 IMPLANT
PASSER SUT SWANSON 36MM LOOP (INSTRUMENTS) IMPLANT
PENCIL BUTTON HOLSTER BLD 10FT (ELECTRODE) ×2 IMPLANT
SET ARTHROSCOPY TUBING (MISCELLANEOUS) ×2
SET ARTHROSCOPY TUBING LN (MISCELLANEOUS) ×1 IMPLANT
SLEEVE SCD COMPRESS KNEE MED (MISCELLANEOUS) IMPLANT
SLING ARM FOAM STRAP LRG (SOFTGOODS) IMPLANT
SLING ARM IMMOBILIZER LRG (SOFTGOODS) IMPLANT
SLING ARM IMMOBILIZER MED (SOFTGOODS) IMPLANT
SLING ARM MED ADULT FOAM STRAP (SOFTGOODS) IMPLANT
SLING ARM XL FOAM STRAP (SOFTGOODS) IMPLANT
SPONGE LAP 4X18 X RAY DECT (DISPOSABLE) IMPLANT
STRIP CLOSURE SKIN 1/2X4 (GAUZE/BANDAGES/DRESSINGS) IMPLANT
SUCTION FRAZIER HANDLE 10FR (MISCELLANEOUS)
SUCTION TUBE FRAZIER 10FR DISP (MISCELLANEOUS) IMPLANT
SUT ETHIBOND 2 OS 4 DA (SUTURE) IMPLANT
SUT ETHILON 2 0 FS 18 (SUTURE) IMPLANT
SUT ETHILON 3 0 PS 1 (SUTURE) IMPLANT
SUT FIBERWIRE #2 38 T-5 BLUE (SUTURE)
SUT RETRIEVER MED (INSTRUMENTS) IMPLANT
SUT TIGER TAPE 7 IN WHITE (SUTURE) IMPLANT
SUT VIC AB 0 CT1 27 (SUTURE)
SUT VIC AB 0 CT1 27XBRD ANBCTR (SUTURE) IMPLANT
SUT VIC AB 2-0 SH 27 (SUTURE)
SUT VIC AB 2-0 SH 27XBRD (SUTURE) IMPLANT
SUT VIC AB 3-0 FS2 27 (SUTURE) IMPLANT
SUTURE FIBERWR #2 38 T-5 BLUE (SUTURE) IMPLANT
TAPE FIBER 2MM 7IN #2 BLUE (SUTURE) IMPLANT
TOWEL OR 17X24 6PK STRL BLUE (TOWEL DISPOSABLE) ×2 IMPLANT
TOWEL OR NON WOVEN STRL DISP B (DISPOSABLE) ×2 IMPLANT
WATER STERILE IRR 1000ML POUR (IV SOLUTION) ×2 IMPLANT
YANKAUER SUCT BULB TIP NO VENT (SUCTIONS) IMPLANT

## 2016-08-03 NOTE — Progress Notes (Signed)
Assisted Dr. Jennifer Allan with left, ultrasound guided, interscalene  block. Side rails up, monitors on throughout procedure. See vital signs in flow sheet. Tolerated Procedure well. 

## 2016-08-03 NOTE — Anesthesia Procedure Notes (Signed)
Procedure Name: Intubation Date/Time: 08/03/2016 10:10 AM Performed by: Lieutenant Diego Pre-anesthesia Checklist: Patient identified, Emergency Drugs available, Suction available and Patient being monitored Patient Re-evaluated:Patient Re-evaluated prior to inductionOxygen Delivery Method: Circle system utilized Preoxygenation: Pre-oxygenation with 100% oxygen Intubation Type: IV induction Ventilation: Mask ventilation without difficulty Laryngoscope Size: Miller and 2 Grade View: Grade I Tube type: Oral Tube size: 7.0 mm Number of attempts: 1 Airway Equipment and Method: Stylet and Oral airway Placement Confirmation: ETT inserted through vocal cords under direct vision,  positive ETCO2 and breath sounds checked- equal and bilateral Secured at: 21 cm Tube secured with: Tape Dental Injury: Teeth and Oropharynx as per pre-operative assessment

## 2016-08-03 NOTE — Brief Op Note (Signed)
08/03/2016  11:57 AM  PATIENT:  [name]  75 y.o. female  PRE-OPERATIVE DIAGNOSIS:  Osteoarthritis left shoulder; rotator cuff tear  POST-OPERATIVE DIAGNOSIS:  Osteoarthritis left shoulder; rotator cuff tear  PROCEDURE:  Procedure(s) with comments: LEFT SHOULDER ARTHROSCOPY WITH ROTATOR CUFF REPAIR AND SUBACROMIAL DECOMPRESSION with distal claviculectomy and extentsive debridement (Left) - Block  SURGEON:  Surgeon(s) and Role:    * Ninetta Lights, MD - Primary  PHYSICIAN ASSISTANT:   ASSISTANTS: Joya Gaskins OPA  ANESTHESIA:   regional and general  EBL:  Total I/O In: 1000 [I.V.:1000] Out: 55 [Blood:55]  BLOOD ADMINISTERED:none  DRAINS: none   LOCAL MEDICATIONS USED:  NONE  SPECIMEN:  No Specimen  DISPOSITION OF SPECIMEN:  N/A  COUNTS:  YES  TOURNIQUET:  * No tourniquets in log *  DICTATION: .Other Dictation: Dictation Number unknown  PLAN OF CARE: Discharge to home after PACU  PATIENT DISPOSITION:  PACU - hemodynamically stable.   Delay start of Pharmacological VTE agent (>24hrs) due to surgical blood loss or risk of bleeding: not applicable

## 2016-08-03 NOTE — Anesthesia Procedure Notes (Signed)
Anesthesia Regional Block:  Interscalene brachial plexus block  Pre-Anesthetic Checklist: ,, timeout performed, Correct Patient, Correct Site, Correct Laterality, Correct Procedure, Correct Position, site marked, Risks and benefits discussed,  Surgical consent,  Pre-op evaluation,  At surgeon's request and post-op pain management  Laterality: Left  Prep: chloraprep       Needles:  Injection technique: Single-shot  Needle Type: Stimiplex     Needle Length: 9cm 9 cm Needle Gauge: 21 and 21 G    Additional Needles:  Procedures: ultrasound guided (picture in chart) Interscalene brachial plexus block Narrative:  Start time: 08/03/2016 8:40 AM End time: 08/03/2016 8:43 AM Injection made incrementally with aspirations every 5 mL.  Performed by: Personally  Anesthesiologist: Nilda Simmer

## 2016-08-03 NOTE — Anesthesia Postprocedure Evaluation (Signed)
Anesthesia Post Note  Patient: [name]  Procedure(s) Performed: Procedure(s) (LRB): LEFT SHOULDER ARTHROSCOPY WITH ROTATOR CUFF REPAIR AND SUBACROMIAL DECOMPRESSION with distal claviculectomy and extentsive debridement (Left)  Patient location during evaluation: PACU Anesthesia Type: General Level of consciousness: awake and alert Pain management: pain level controlled Vital Signs Assessment: post-procedure vital signs reviewed and stable Respiratory status: spontaneous breathing, nonlabored ventilation and respiratory function stable Cardiovascular status: blood pressure returned to baseline and stable Postop Assessment: no signs of nausea or vomiting Anesthetic complications: no    Last Vitals:  Vitals:   08/03/16 1200 08/03/16 1202  BP: (!) 146/73   Pulse: 95 95  Resp: 19 (!) 21  Temp:      Last Pain:  Vitals:   08/03/16 0757  TempSrc: Oral  PainSc: 8                  Nilda Simmer

## 2016-08-03 NOTE — Discharge Instructions (Signed)
Diet: As you were doing prior to hospitalization   Activity: Increase activity slowly as tolerated  No lifting or driving for 6 weeks   Shower: May shower without a dressing on post op day #2, NO SOAKING in tub   Dressing: You may change your dressing on post op day #2.  Then change the dressing daily with sterile 4"x4"s gauze dressing or band aids.   Weight Bearing: nonweight bearing operative arm in sling until follow up.  May remove to shower keep arm close to body, pendulum exercises.  To prevent constipation: you may use a stool softener such as -  Colace ( over the counter) 100 mg by mouth twice a day  Drink plenty of fluids ( prune juice may be helpful) and high fiber foods  Miralax ( over the counter) for constipation as needed.   Precautions: If you experience chest pain or shortness of breath - call 911 immediately For transfer to the hospital emergency department!!  If you develop a fever greater that 101 F, purulent drainage from wound, increased redness or drainage from wound, or calf pain -- Call the office   Follow- Up Appointment: Please call for an appointment to be seen in 1 week as previously scheduled  [place] - [phone]  Regional Anesthesia Blocks  1. Numbness or the inability to move the "blocked" extremity may last from 3-48 hours after placement. The length of time depends on the medication injected and your individual response to the medication. If the numbness is not going away after 48 hours, call your surgeon.  2. The extremity that is blocked will need to be protected until the numbness is gone and the  Strength has returned. Because you cannot feel it, you will need to take extra care to avoid injury. Because it may be weak, you may have difficulty moving it or using it. You may not know what position it is in without looking at it while the block is in effect.  3. For blocks in the legs and feet, returning to weight bearing and walking needs to be  done carefully. You will need to wait until the numbness is entirely gone and the strength has returned. You should be able to move your leg and foot normally before you try and bear weight or walk. You will need someone to be with you when you first try to ensure you do not fall and possibly risk injury.  4. Bruising and tenderness at the needle site are common side effects and will resolve in a few days.  5. Persistent numbness or new problems with movement should be communicated to the surgeon or the [place] [phone] [place] [phone]).  Post Anesthesia Home Care Instructions  Activity: Get plenty of rest for the remainder of the day. A responsible adult should stay with you for 24 hours following the procedure.  For the next 24 hours, DO NOT: -Drive a car -Paediatric nurse -Drink alcoholic beverages -Take any medication unless instructed by your physician -Make any legal decisions or sign important papers.  Meals: Start with liquid foods such as gelatin or soup. Progress to regular foods as tolerated. Avoid greasy, spicy, heavy foods. If nausea and/or vomiting occur, drink only clear liquids until the nausea and/or vomiting subsides. Call your physician if vomiting continues.  Special Instructions/Symptoms: Your throat may feel dry or sore from the anesthesia or the breathing tube placed in your throat during surgery. If this causes discomfort, gargle with warm  salt water. The discomfort should disappear within 24 hours.  If you had a scopolamine patch placed behind your ear for the management of post- operative nausea and/or vomiting:  1. The medication in the patch is effective for 72 hours, after which it should be removed.  Wrap patch in a tissue and discard in the trash. Wash hands thoroughly with soap and water. 2. You may remove the patch earlier than 72 hours if you experience unpleasant side effects which may include dry mouth,  dizziness or visual disturbances. 3. Avoid touching the patch. Wash your hands with soap and water after contact with the patch.

## 2016-08-03 NOTE — Interval H&P Note (Signed)
History and Physical Interval Note:  08/03/2016 9:25 AM  [name]  has presented today for surgery, with the diagnosis of OA left shoulder rotator cuff tear  The various methods of treatment have been discussed with the patient and family. After consideration of risks, benefits and other options for treatment, the patient has consented to  Procedure(s) with comments: left SHOULDER ARTHROSCOPY WITH ROTATOR CUFF REPAIR AND SUBACROMIAL DECOMPRESSION with distal claviculectomy and extentsive debridement (Left) - Block as a surgical intervention .  The patient's history has been reviewed, patient examined, no change in status, stable for surgery.  I have reviewed the patient's chart and labs.  Questions were answered to the patient's satisfaction.     Ninetta Lights

## 2016-08-03 NOTE — Transfer of Care (Signed)
Immediate Anesthesia Transfer of Care Note  Patient: [name]  Procedure(s) Performed: Procedure(s) with comments: LEFT SHOULDER ARTHROSCOPY WITH ROTATOR CUFF REPAIR AND SUBACROMIAL DECOMPRESSION with distal claviculectomy and extentsive debridement (Left) - Block  Patient Location: PACU  Anesthesia Type:General  Level of Consciousness: awake  Airway & Oxygen Therapy: Patient Spontanous Breathing and Patient connected to face mask oxygen  Post-op Assessment: Report given to RN and Post -op Vital signs reviewed and stable  Post vital signs: Reviewed and stable  Last Vitals:  Vitals:   08/03/16 0900 08/03/16 1124  BP: (!) 182/76   Pulse:  98  Resp:  (!) 24  Temp:      Last Pain:  Vitals:   08/03/16 0757  TempSrc: Oral  PainSc: 8       Patients Stated Pain Goal: 4 ([id number])  Complications: No apparent anesthesia complications

## 2016-08-04 NOTE — Op Note (Signed)
NAMEAZILE, ANTONOVICH NO.:  [id number]  MEDICAL RECORD NO.:  [id number]  LOCATION:                                 FACILITY:  PHYSICIAN:  Ninetta Lights, M.D. DATE OF BIRTH:  1941-05-29  DATE OF PROCEDURE:  08/03/2016 DATE OF DISCHARGE:                              OPERATIVE REPORT   PREOPERATIVE DIAGNOSIS:  Left shoulder complete retracted rotator cuff tear, supra and infraspinatus tendon.  POSTOPERATIVE DIAGNOSES: 1. Left shoulder complete retracted rotator cuff tear, supra and     infraspinatus tendon with subacromial impingement, bursitis. 2. Degenerative joint disease, AC joint.  PROCEDURES: 1. Left shoulder exam under anesthesia, arthroscopy. 2. Debridement and then primary repair, rotator cuff; fiber weave     sutures x3, SwiveLock anchors x3. 3. Bursectomy, acromioplasty, CA ligament release. 4. Excision of distal clavicle.  SURGEON:  Ninetta Lights, M.D.  ASSISTANT:  Angelena Sole, PA, present throughout the entire case and necessary for timely completion of procedure.  ANESTHESIA:  General.  BLOOD LOSS:  Minimal.  SPECIMENS:  None.  CULTURES:  None.  COMPLICATION:  None.  DRESSINGS:  Sterile compressive with shoulder immobilizer.  PROCEDURE:  The patient was brought to the operating room and after adequate anesthesia had been obtained, placed in a beach-chair position on the shoulder positioner, prepped and draped in the usual sterile fashion.  Full motion is stable, shoulder.  Three portals, anterior, posterior, and lateral.  Arthroscope introduced, shoulder was distended and inspected.  Complete tear, supraspinatus tendon, subacute. Retracted between 1 and 2 cm.  Entire supraspinatus, top of the infraspinatus.  Debrided, mobilized.  Tuberosity roughened.  Very osteopenic in the central part of her tuberosity.  Labrum, biceps tendon intact.  Bursa resected.  Acromioplasty from type 2 to type 1 acromion, released the CA  ligament.  Grade 4 changes, distal clavicle. Periarticular spurs, lateral centimeter of clavicle resected.  Adequacy of decompression confirmed viewing from all portals.  Cannula placed laterally.  Cuff was captured with three horizontal mattress sutures with the Scorpion device.  Anchored in the tuberosity.  The front and back SwiveLock anchors were good, fairly solid despite her osteopenia. The middle one, however, pulled right out of the soft bone.  I utilized a Chartered loss adjuster, brought it over top of the tuberosity, and anchored it down the lateral shaft.  Although still a little tenuous there, I was able achieve a nice, firm water-tight closure of the entire cuff. Instruments were fully removed.  Portals were closed with nylon. Sterile compressive dressing applied.  Shoulder immobilizer applied. Anesthesia reversed.  Brought to the recovery room.  Tolerated the surgery well.  No complications.     Ninetta Lights, M.D.   ______________________________ Ninetta Lights, M.D.    DFM/MEDQ  D:  08/03/2016  T:  08/04/2016  Job:  [id number]

## 2016-08-06 ENCOUNTER — Encounter (HOSPITAL_BASED_OUTPATIENT_CLINIC_OR_DEPARTMENT_OTHER): Payer: Self-pay | Admitting: Orthopedic Surgery

## 2016-09-18 ENCOUNTER — Other Ambulatory Visit: Payer: Medicare Other | Admitting: *Deleted

## 2016-09-18 DIAGNOSIS — I10 Essential (primary) hypertension: Secondary | ICD-10-CM

## 2016-09-18 DIAGNOSIS — E7800 Pure hypercholesterolemia, unspecified: Secondary | ICD-10-CM

## 2016-09-18 LAB — HEPATIC FUNCTION PANEL
ALK PHOS: 55 U/L (ref 33–130)
ALT: 5 U/L — AB (ref 6–29)
AST: 11 U/L (ref 10–35)
Albumin: 4.2 g/dL (ref 3.6–5.1)
BILIRUBIN DIRECT: 0.1 mg/dL (ref <=0.2)
BILIRUBIN TOTAL: 0.5 mg/dL (ref 0.2–1.2)
Indirect Bilirubin: 0.4 mg/dL (ref 0.2–1.2)
Total Protein: 6.6 g/dL (ref 6.1–8.1)

## 2016-09-18 LAB — LIPID PANEL
CHOL/HDL RATIO: 2.6 ratio (ref <=5.0)
CHOLESTEROL: 165 mg/dL (ref 125–200)
HDL: 63 mg/dL (ref >=46)
LDL Cholesterol: 80 mg/dL (ref <130)
Triglycerides: 111 mg/dL (ref <150)
VLDL: 22 mg/dL (ref <30)

## 2016-09-18 NOTE — Addendum Note (Signed)
Addended by: Eulis Foster on: 09/18/2016 09:05 AM   Modules accepted: Orders

## 2016-09-19 ENCOUNTER — Other Ambulatory Visit: Payer: Medicare Other

## 2016-10-18 IMAGING — MG MM SCREENING BREAST TOMO BILATERAL
8 series · 8 of 24 positions shown · non-contrast
Comparison: Previous exam(s).

CLINICAL DATA: Screening.

EXAM:
DIGITAL SCREENING BILATERAL MAMMOGRAM WITH 3D TOMO WITH CAD

[R CC]
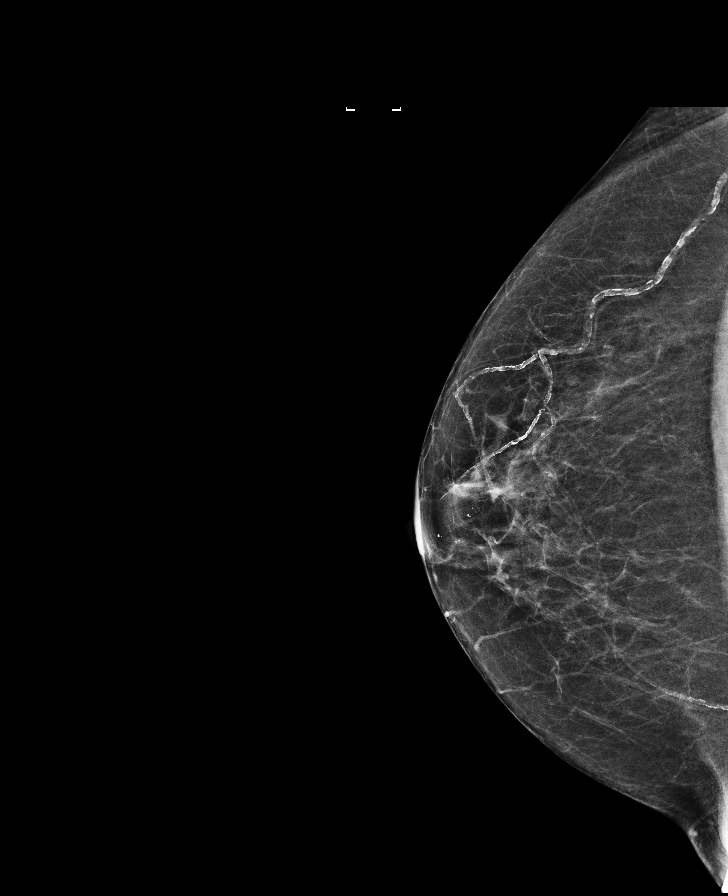

[L MLO]
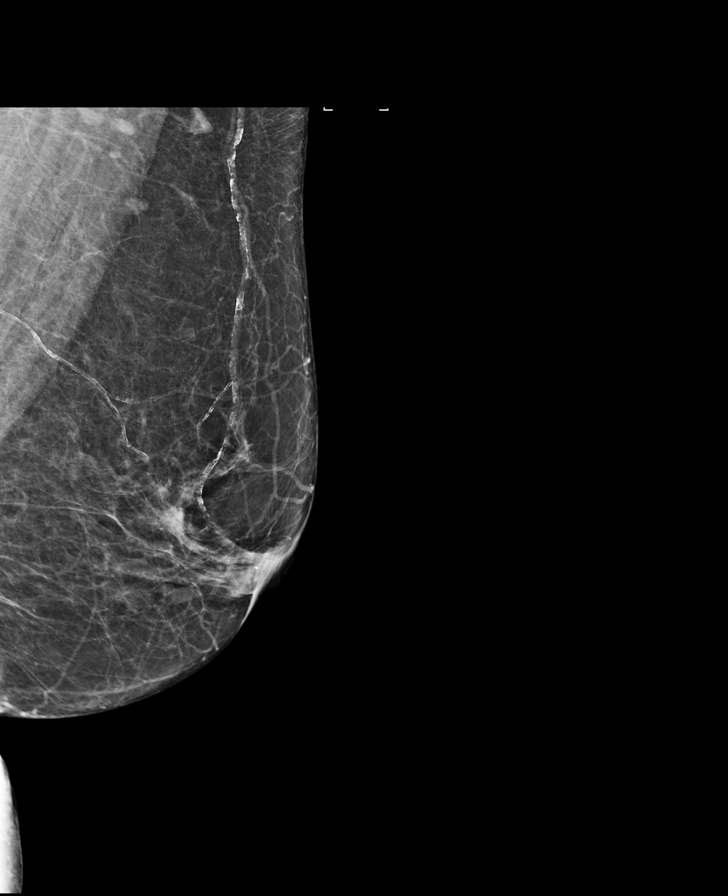

[L CC]
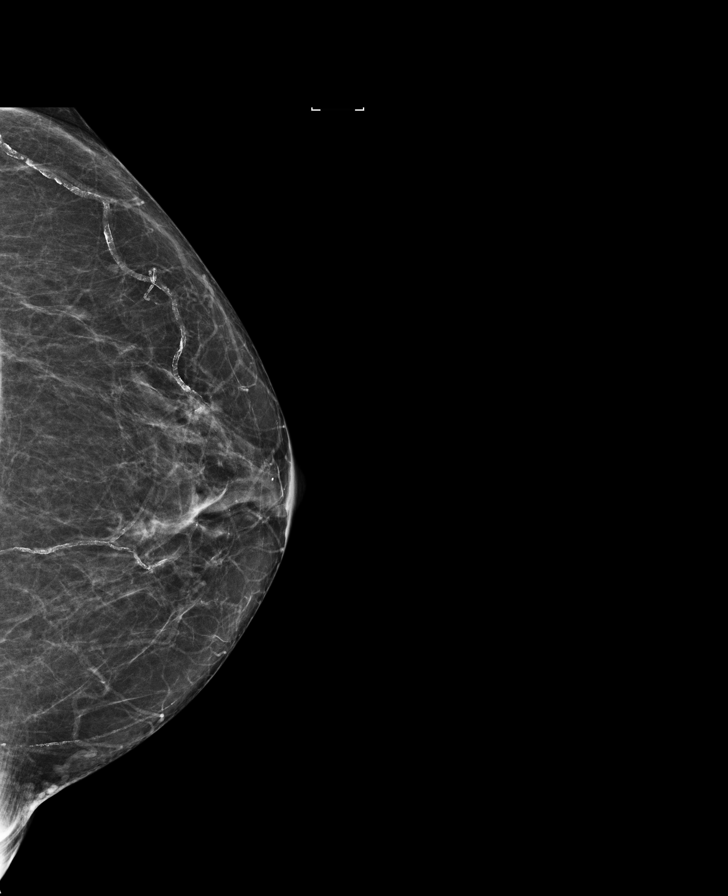

[R MLO]
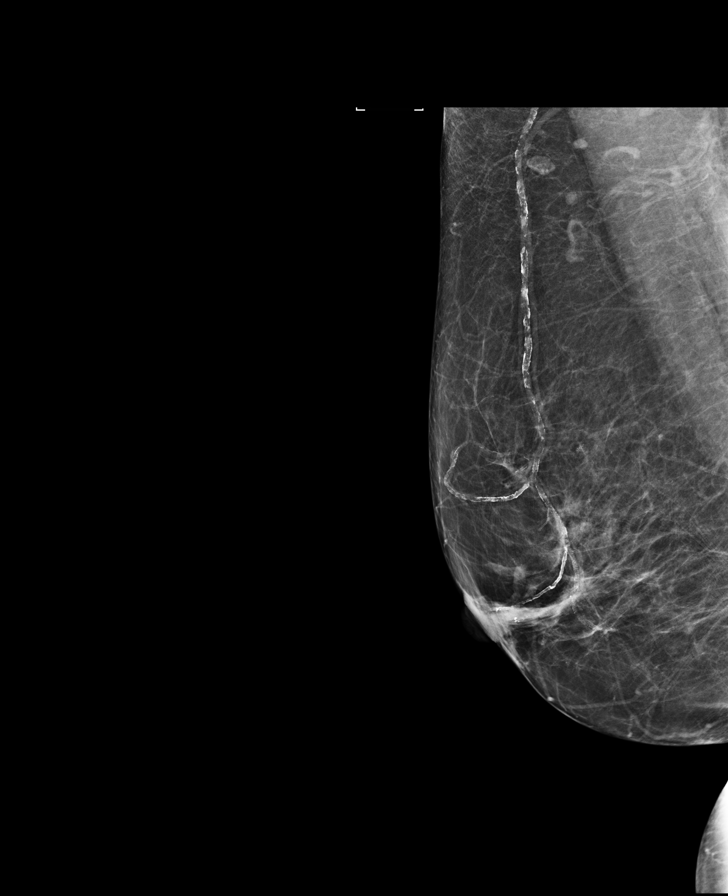

[R CC tomo · tomo slice 35/69.0]
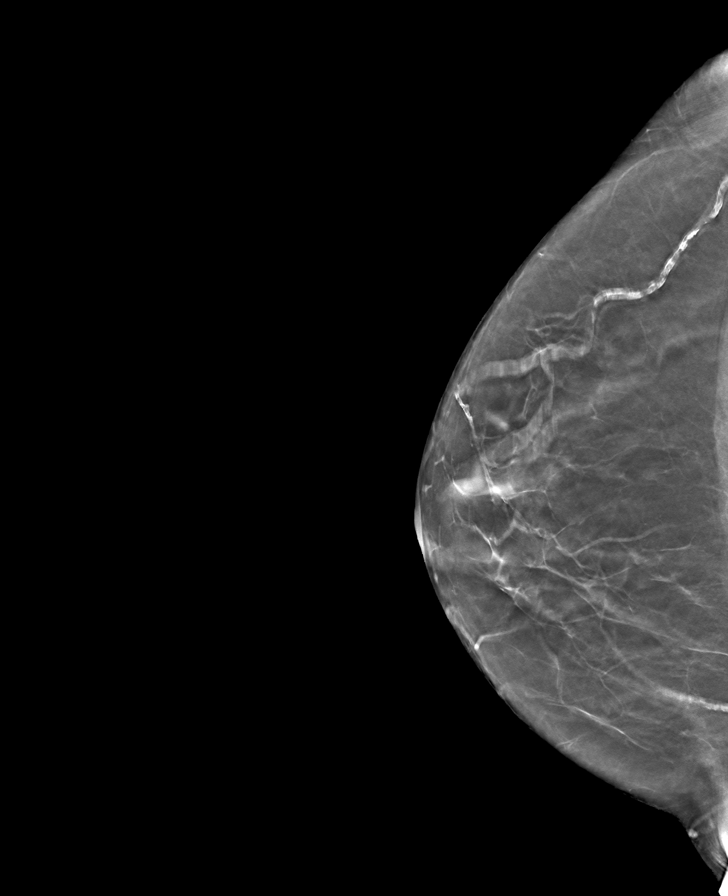

[L MLO tomo · tomo slice 35/69.0]
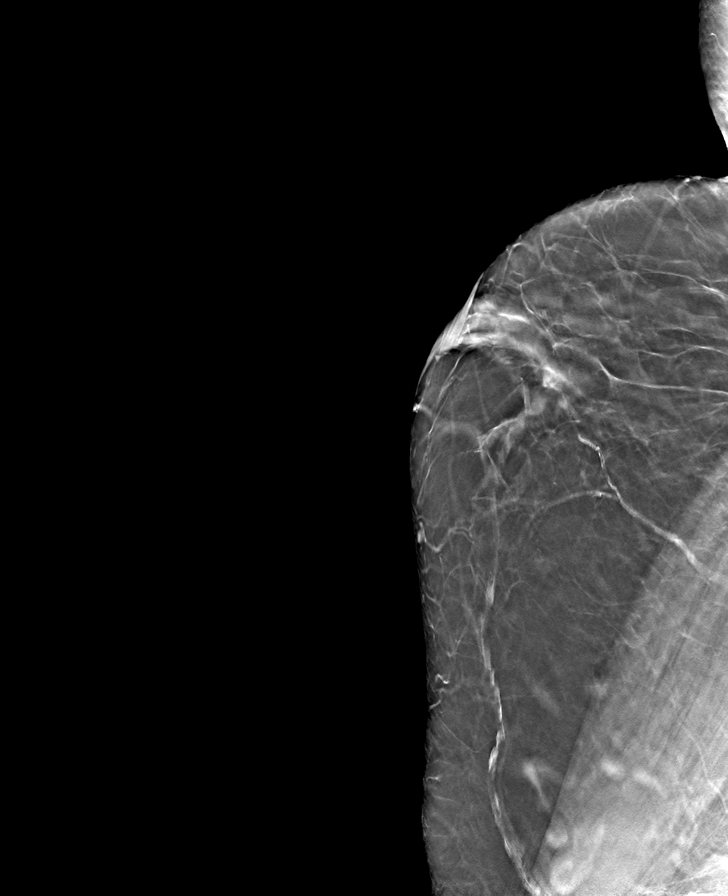

[R MLO tomo · tomo slice 39/76.0]
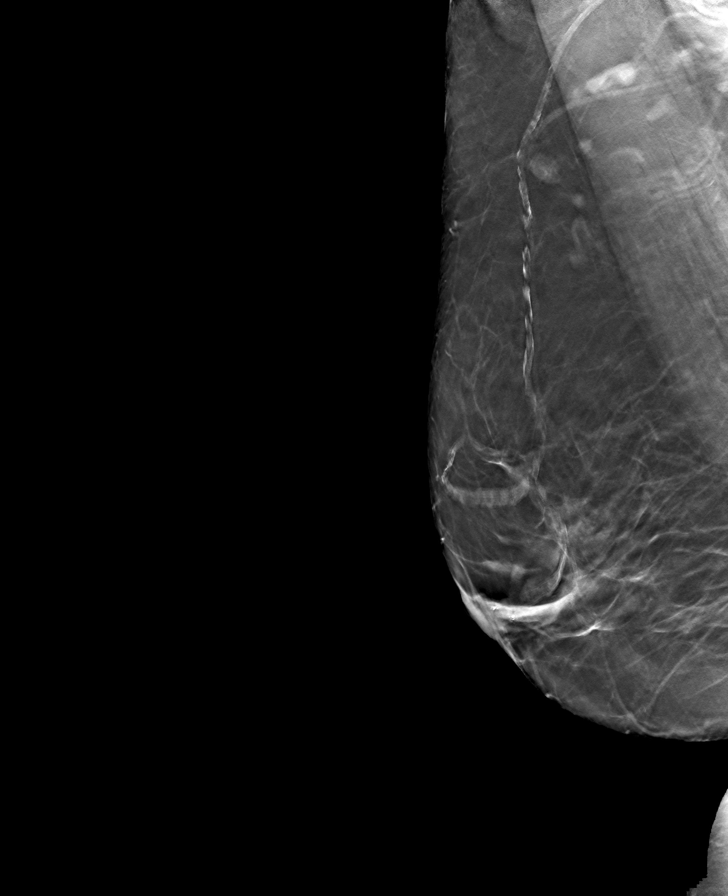

[L CC tomo · tomo slice 35/68.0]
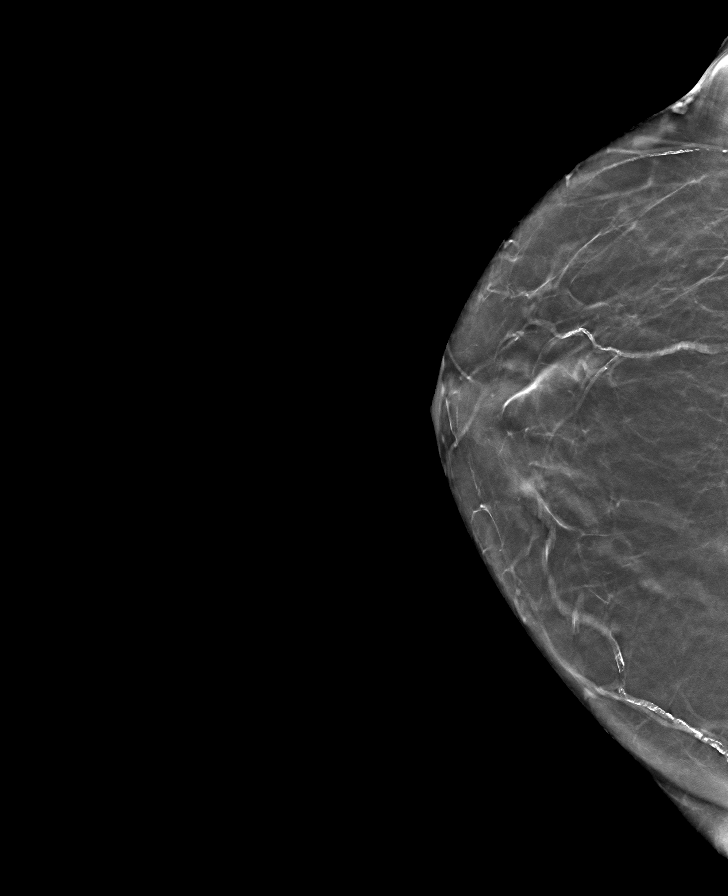

[8 of 24 positions shown; findings below may reference images not displayed]

ACR Breast Density Category b: There are scattered areas of
fibroglandular density.
FINDINGS: There are no findings suspicious for malignancy. Images were
processed with CAD.
IMPRESSION: No mammographic evidence of malignancy. A result letter of this
screening mammogram will be mailed directly to the patient.

RECOMMENDATION:
Screening mammogram in one year. (Code:55-L-23V)

BI-RADS CATEGORY  1: Negative.

## 2016-12-08 DIAGNOSIS — M25511 Pain in right shoulder: Secondary | ICD-10-CM | POA: Diagnosis not present

## 2017-01-22 DIAGNOSIS — N183 Chronic kidney disease, stage 3 (moderate): Secondary | ICD-10-CM | POA: Diagnosis not present

## 2017-01-22 DIAGNOSIS — I25810 Atherosclerosis of coronary artery bypass graft(s) without angina pectoris: Secondary | ICD-10-CM | POA: Diagnosis not present

## 2017-01-22 DIAGNOSIS — D631 Anemia in chronic kidney disease: Secondary | ICD-10-CM | POA: Diagnosis not present

## 2017-01-22 DIAGNOSIS — M109 Gout, unspecified: Secondary | ICD-10-CM | POA: Diagnosis not present

## 2017-01-22 DIAGNOSIS — E785 Hyperlipidemia, unspecified: Secondary | ICD-10-CM | POA: Diagnosis not present

## 2017-01-22 DIAGNOSIS — I129 Hypertensive chronic kidney disease with stage 1 through stage 4 chronic kidney disease, or unspecified chronic kidney disease: Secondary | ICD-10-CM | POA: Diagnosis not present

## 2017-01-22 DIAGNOSIS — Z940 Kidney transplant status: Secondary | ICD-10-CM | POA: Diagnosis not present

## 2017-01-22 DIAGNOSIS — E1129 Type 2 diabetes mellitus with other diabetic kidney complication: Secondary | ICD-10-CM | POA: Diagnosis not present

## 2017-01-22 DIAGNOSIS — E669 Obesity, unspecified: Secondary | ICD-10-CM | POA: Diagnosis not present

## 2017-01-22 DIAGNOSIS — N2581 Secondary hyperparathyroidism of renal origin: Secondary | ICD-10-CM | POA: Diagnosis not present

## 2017-01-22 DIAGNOSIS — K31811 Angiodysplasia of stomach and duodenum with bleeding: Secondary | ICD-10-CM | POA: Diagnosis not present

## 2017-02-15 ENCOUNTER — Other Ambulatory Visit: Payer: Self-pay | Admitting: Endocrinology

## 2017-02-15 DIAGNOSIS — Z1231 Encounter for screening mammogram for malignant neoplasm of breast: Secondary | ICD-10-CM

## 2017-02-15 DIAGNOSIS — N183 Chronic kidney disease, stage 3 (moderate): Secondary | ICD-10-CM | POA: Diagnosis not present

## 2017-02-23 ENCOUNTER — Encounter: Payer: Self-pay | Admitting: Cardiovascular Disease

## 2017-03-14 ENCOUNTER — Encounter: Payer: Self-pay | Admitting: Cardiovascular Disease

## 2017-03-14 ENCOUNTER — Encounter (INDEPENDENT_AMBULATORY_CARE_PROVIDER_SITE_OTHER): Payer: Self-pay

## 2017-03-14 ENCOUNTER — Ambulatory Visit (INDEPENDENT_AMBULATORY_CARE_PROVIDER_SITE_OTHER): Payer: Medicare Other | Admitting: Cardiovascular Disease

## 2017-03-14 VITALS — BP 136/74 | HR 77 | Ht 62.0 in | Wt 157.8 lb

## 2017-03-14 DIAGNOSIS — R0602 Shortness of breath: Secondary | ICD-10-CM

## 2017-03-14 DIAGNOSIS — I2510 Atherosclerotic heart disease of native coronary artery without angina pectoris: Secondary | ICD-10-CM | POA: Diagnosis not present

## 2017-03-14 DIAGNOSIS — R5383 Other fatigue: Secondary | ICD-10-CM | POA: Diagnosis not present

## 2017-03-14 NOTE — Patient Instructions (Signed)
Medication Instructions:  Your physician recommends that you continue on your current medications as directed. Please refer to the Current Medication list given to you today.   Labwork: None Ordered   Testing/Procedures: Your physician has requested that you have an echocardiogram. Echocardiography is a painless test that uses sound waves to create images of your heart. It provides your doctor with information about the size and shape of your heart and how well your heart's chambers and valves are working. This procedure takes approximately one hour. There are no restrictions for this procedure.  Your physician has requested that you have a lexiscan myoview. For further information please visit [email or web address]. Please follow instruction sheet, as given.   Follow-Up: Your physician wants you to follow-up in: 1 year with Dr. Nahser. You will receive a reminder letter in the mail two months in advance. If you don't receive a letter, please call our office to schedule the follow-up appointment.   If you need a refill on your cardiac medications before your next appointment, please call your pharmacy.   Thank you for choosing [facility]! Rosia Syme, RN [phone]    

## 2017-03-14 NOTE — Progress Notes (Signed)
Cardiology Office Note   Date:  03/14/2017   ID:  [name], DOB Feb 25, 1941, MRN [id number]  PCP:  Renato Shin, MD  Cardiologist:  Mertie Moores, MD   Chief Complaint  Patient presents with  . Coronary Artery Disease   Problem list 1. CAD - CABG , 2001 2. ESRD - s/p renal transplant - Sept. 2012  3. GI bleed:  4. DM    History of Present Illness: [name] is a 76 y.o. female who presents  Today to follow-up shortness of breath. I saw her last April, 2015. Before that she had had some difficulties with shortness of breath. We thought that some of her symptoms were related to increased levels of Prograf.  Fortunately she is doing well. She is not having any chest pain or significant shortness of breath.  March 07, 2016:  Doing well.  Previous patient of Dr. Ron Parker. No issues.   Some DOE if she walks too far.  No Cp , has not had to take any NTG .  March 14, 2017:  Has had some dyspnea with exertion,  Seems to have worsened over the past month or so Has to stop and rest twice when walking to the mailbox   Has had some allergy issues Stays fatigued.    Past Medical History:  Diagnosis Date  . 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 Asthma   . Coronary artery disease    cath 11/2007, grafts patent /  Nuclear, June, 2011, prior inferior MI with mild peri-infarct ischemia, anterior breast attenuation, EF 67%, done for renal transplant assessment / Low level exercise/Lexiscan Myoview (11/2013): EF 67%, no ischemi; normal study  . Diabetes mellitus    type 2  . ESRD on dialysis [facility])    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, 2012  . GERD (gastroesophageal reflux disease)   . GI bleed 11/26/2010   January, 2012 , AVM  . Gout   . History of methicillin resistant staphylococcus aureus (MRSA)   . Hyperlipidemia   . Hyperparathyroidism   . Hypertension   . Myocardial infarction ([place])   .  Osteoporosis   . Pneumonia 08/2010    Hospitalization, October, 2011  . Primary osteoarthritis, 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  . S/P kidney transplant    September, 2012, [facility]  . Subacromial impingement of 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    Past Surgical History:  Procedure Laterality Date  . ABDOMINAL HYSTERECTOMY  1980'S  . ARTERIOVENOUS GRAFT PLACEMENT Left 03/04/2007   forearm  . BREAST SURGERY  YRS AGO   RT BREAST CYST REMOVED   . CARDIAC CATHETERIZATION  06/03/2002; 12/04/2007  . COLONOSCOPY WITH PROPOFOL N/A 07/14/2014   Procedure: COLONOSCOPY WITH PROPOFOL;  Surgeon: Lear Ng, MD;  Location: [facility] ENDOSCOPY;  Service: Endoscopy;  Laterality: N/A;  . CORONARY ARTERY BYPASS GRAFT  06/06/2002   x 4  . HOT HEMOSTASIS N/A 07/14/2014   Procedure: HOT HEMOSTASIS (ARGON PLASMA COAGULATION/BICAP);  Surgeon: Lear Ng, MD;  Location: [facility];  Service: Endoscopy;  Laterality: N/A;  . KIDNEY TRANSPLANT Right 08/04/2011  . ORIF PATELLA Left 04/06/2016   Procedure: OPEN REDUCTION INTERNAL (ORIF) LEFT  PATELLA;  Surgeon: Ninetta Lights, MD;  Location: [place];  Service: Orthopedics;  Laterality: Left;  . SHOULDER ARTHROSCOPY WITH ROTATOR CUFF REPAIR AND SUBACROMIAL DECOMPRESSION Left 08/03/2016   Procedure: LEFT SHOULDER ARTHROSCOPY WITH ROTATOR  CUFF REPAIR AND SUBACROMIAL DECOMPRESSION with distal claviculectomy and extentsive debridement;  Surgeon: Ninetta Lights, MD;  Location: [facility];  Service: Orthopedics;  Laterality: Left;  Block  . THROMBECTOMY AND REVISION OF ARTERIOVENTOUS (AV) GORETEX  GRAFT Left 05/08/2007   forearm  . TOTAL HIP ARTHROPLASTY  12/18/2012   Procedure: TOTAL HIP ARTHROPLASTY;  Surgeon: Ninetta Lights, MD;  Location: [facility];  Service: Orthopedics;  Laterality: Left;  Marland Kitchen VESICOVAGINAL FISTULA CLOSURE W/ TAH  1984    Patient Active Problem List   Diagnosis Date Noted  . Coronary artery disease     Priority: High  .  Complete rotator cuff tear of 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  . Subacromial impingement of 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  . Primary osteoarthritis, 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  . History of methicillin resistant staphylococcus aureus (MRSA)   . Benign neoplasm of colon 07/14/2014  . Nausea & vomiting 12/22/2013  . Dyspnea 12/22/2013  . Diabetes ([place]) 02/28/2013  . Preop cardiovascular exam   . Type II or unspecified type diabetes mellitus without mention of complication, not stated as uncontrolled 06/05/2012  . Encounter for long-term (current) use of other medications 02/26/2012  . Type II or unspecified type diabetes mellitus with renal manifestations, not stated as uncontrolled(250.40) 02/26/2012  . S/P kidney transplant   . H/O steroid therapy   . Anemia   . Hyperlipidemia   . Hypertension   . Aspirin allergy   . Hx of CABG   . Ejection fraction   . Anemia   . GI bleed 11/26/2010  . Pneumonia 08/20/2010  . FOOT PAIN 01/20/2010  . THROMBOCYTOPENIA 01/11/2009  . 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 GOUT 08/12/2007  . ASTHMA 08/12/2007  . MENOPAUSAL SYNDROME 08/12/2007  . OSTEOPOROSIS 08/12/2007  . VERTIGO 08/12/2007      Current Outpatient Prescriptions  Medication Sig Dispense Refill  . amLODipine (NORVASC) 10 MG tablet Take 10 mg by mouth every morning.     Marland Kitchen aspirin EC 81 MG tablet Take 81 mg by mouth daily.    . Azelastine HCl 0.15 % SOLN Place 1 spray into the nose 2 (two) times daily.    . calcitRIOL (ROCALTROL) 0.25 MCG capsule Take 0.5-0.75 mcg by mouth See admin instructions. Alternate 2 tablets one day, 3 tablets the next day.    . fluticasone (FLONASE) 50 MCG/ACT nasal spray Place 1 spray into both nostrils 2 (two) times daily.    Marland Kitchen glipiZIDE (GLUCOTROL) 5 MG tablet Take 5 mg by mouth daily before breakfast.     . nitroGLYCERIN (NITROSTAT) 0.4 MG SL tablet Place 1 tablet (0.4 mg total) under the tongue every 5 (five) minutes as needed for chest pain. 25 tablet 6  .  omeprazole (PRILOSEC) 20 MG capsule Take 20 mg by mouth daily.     . ondansetron (ZOFRAN) 4 MG tablet Take 1 tablet (4 mg total) by mouth every 8 (eight) hours as needed for nausea or vomiting. 40 tablet 0  . predniSONE (DELTASONE) 5 MG tablet Take 5 mg by mouth daily. Reported on 03/07/2016    . rosuvastatin (CRESTOR) 10 MG tablet Take 0.5 tablets (5 mg total) by mouth daily. 30 tablet 11  . tacrolimus (PROGRAF) 1 MG capsule Take 6 mg by mouth every 12 (twelve) hours.     No current facility-administered medications for this visit.     Allergies:   Lactose; Asa arthritis strength-antacid [aspirin buffered]; Aspirin; Atorvastatin; Banana; Lactose intolerance (gi); and Lisinopril    Social History:  The patient  reports that she quit smoking about 33 years ago. Her smoking use included Cigarettes. She has a 8.00 pack-year smoking history. She has never used smokeless tobacco. She reports that she does not drink alcohol or use drugs.   Family History:  The patient's family history is not on file.    ROS:  Please see the history of present illness.      Patient denies fever, chills, headache, sweats, rash, change in vision, change in hearing, chest pain, cough, nausea or vomiting, urinary symptoms. All other systems are reviewed and are negative.   PHYSICAL EXAM: VS:  BP 136/74 (BP Location: Left Arm, Patient Position: Sitting, Cuff Size: Normal)   Pulse 77   Ht 5\' 2"  (1.575 m)   Wt 157 lb 12.8 oz (71.6 kg)   SpO2 96%   BMI 28.86 kg/m  ,  patient is oriented to person time and place. Affect is normal. Head is atraumatic. Sclera and conjunctiva are normal. There is no jugulovenous distention. Lungs are clear. Respiratory effort is nonlabored. Cardiac exam reveals an S1 and S2. The abdomen is soft. There is no peripheral edema. There are no musculoskeletal deformities. There are no skin rashes.  EKG:    An EKG is done today and reviewed by me.   NSR at 77.  Normal ECG   Recent  Labs: 08/01/2016: BUN 20; Creatinine, Ser 1.13; Potassium 3.9; Sodium 142 09/18/2016: ALT 5    Lipid Panel    Component Value Date/Time   CHOL 165 09/18/2016 0905   TRIG 111 09/18/2016 0905   HDL 63 09/18/2016 0905   CHOLHDL 2.6 09/18/2016 0905   VLDL 22 09/18/2016 0905   LDLCALC 80 09/18/2016 0905   LDLDIRECT 136.3 02/26/2012 1715      Wt Readings from Last 3 Encounters:  03/14/17 157 lb 12.8 oz (71.6 kg)  08/03/16 153 lb (69.4 kg)  04/06/16 160 lb (72.6 kg)      Current medicines are reviewed   Patient understands her medications well.     ASSESSMENT AND PLAN:  1. CAD - CABG , 2001 - seems to be stable,   No angina but she is having worsening DOE recently .  2. Shortness breath with exertion/fatigue:   She denies angina but is having DOE .     Cannot tell If she's having problems with allergies or not. For further evaluation we will do a Lexiscan Myoview study and also an echocardiogram. Will see her in 1 year - sooner if needed 3. ESRD - s/p renal transplant - Sept. 2012  - managed by Dr. Mercy Moore and at [facility]   4. GI bleed:   5. DM     Mertie Moores, MD  03/14/2017 10:47 AM    [address],  [place], [place]  [address] Pager [phone] Phone: [phone]; Fax: [phone]

## 2017-03-20 ENCOUNTER — Telehealth (HOSPITAL_COMMUNITY): Payer: Self-pay | Admitting: *Deleted

## 2017-03-20 NOTE — Telephone Encounter (Signed)
Patient given detailed instructions per Myocardial Perfusion Study Information Sheet for the test on 03/22/17 at 1000. Patient notified to arrive 15 minutes early and that it is imperative to arrive on time for appointment to keep from having the test rescheduled.  If you need to cancel or reschedule your appointment, please call the office within 24 hours of your appointment. Failure to do so may result in a cancellation of your appointment, and a $50 no show fee. Patient verbalized understanding.Kemba Hoppes, [place]

## 2017-03-22 ENCOUNTER — Ambulatory Visit (HOSPITAL_BASED_OUTPATIENT_CLINIC_OR_DEPARTMENT_OTHER): Payer: Medicare Other

## 2017-03-22 ENCOUNTER — Other Ambulatory Visit: Payer: Self-pay

## 2017-03-22 ENCOUNTER — Ambulatory Visit (HOSPITAL_COMMUNITY): Payer: Medicare Other | Attending: Cardiovascular Disease

## 2017-03-22 DIAGNOSIS — R0602 Shortness of breath: Secondary | ICD-10-CM | POA: Diagnosis not present

## 2017-03-22 DIAGNOSIS — I2510 Atherosclerotic heart disease of native coronary artery without angina pectoris: Secondary | ICD-10-CM | POA: Insufficient documentation

## 2017-03-22 DIAGNOSIS — R5383 Other fatigue: Secondary | ICD-10-CM | POA: Diagnosis not present

## 2017-03-22 DIAGNOSIS — I272 Pulmonary hypertension, unspecified: Secondary | ICD-10-CM | POA: Diagnosis not present

## 2017-03-22 LAB — MYOCARDIAL PERFUSION IMAGING
CHL CUP NUCLEAR SDS: 8
CHL CUP NUCLEAR SSS: 9
LHR: 0.27
LV sys vol: 19 mL
LVDIAVOL: 82 mL (ref 46–106)
NUC STRESS TID: 0.91
Peak HR: 107 {beats}/min
Rest HR: 83 {beats}/min
SRS: 1

## 2017-03-22 LAB — ECHOCARDIOGRAM COMPLETE
HEIGHTINCHES: 62 in
WEIGHTICAEL: 2512 [oz_av]

## 2017-03-22 MED ORDER — TECHNETIUM TC 99M TETROFOSMIN IV KIT
32.4000 | PACK | Freq: Once | INTRAVENOUS | Status: AC | PRN
  Administered 2017-03-22: 32.4 via INTRAVENOUS
  Filled 2017-03-22: qty 33

## 2017-03-22 MED ORDER — REGADENOSON 0.4 MG/5ML IV SOLN
0.4000 mg | Freq: Once | INTRAVENOUS | Status: AC
  Administered 2017-03-22: 0.4 mg via INTRAVENOUS

## 2017-03-22 MED ORDER — TECHNETIUM TC 99M TETROFOSMIN IV KIT
10.9000 | PACK | Freq: Once | INTRAVENOUS | Status: AC | PRN
  Administered 2017-03-22: 10.9 via INTRAVENOUS
  Filled 2017-03-22: qty 11

## 2017-03-26 DIAGNOSIS — E1122 Type 2 diabetes mellitus with diabetic chronic kidney disease: Secondary | ICD-10-CM | POA: Diagnosis not present

## 2017-03-26 DIAGNOSIS — R062 Wheezing: Secondary | ICD-10-CM | POA: Diagnosis not present

## 2017-03-26 DIAGNOSIS — Z79899 Other long term (current) drug therapy: Secondary | ICD-10-CM | POA: Diagnosis not present

## 2017-03-26 DIAGNOSIS — I10 Essential (primary) hypertension: Secondary | ICD-10-CM | POA: Diagnosis not present

## 2017-03-26 DIAGNOSIS — Z940 Kidney transplant status: Secondary | ICD-10-CM | POA: Diagnosis not present

## 2017-03-26 DIAGNOSIS — R0609 Other forms of dyspnea: Secondary | ICD-10-CM | POA: Diagnosis not present

## 2017-03-26 DIAGNOSIS — I2510 Atherosclerotic heart disease of native coronary artery without angina pectoris: Secondary | ICD-10-CM | POA: Diagnosis not present

## 2017-03-26 DIAGNOSIS — E119 Type 2 diabetes mellitus without complications: Secondary | ICD-10-CM | POA: Diagnosis not present

## 2017-03-26 DIAGNOSIS — B259 Cytomegaloviral disease, unspecified: Secondary | ICD-10-CM | POA: Diagnosis not present

## 2017-03-26 DIAGNOSIS — Z4822 Encounter for aftercare following kidney transplant: Secondary | ICD-10-CM | POA: Diagnosis not present

## 2017-03-26 DIAGNOSIS — N181 Chronic kidney disease, stage 1: Secondary | ICD-10-CM | POA: Diagnosis not present

## 2017-03-29 ENCOUNTER — Ambulatory Visit
Admission: RE | Admit: 2017-03-29 | Discharge: 2017-03-29 | Disposition: A | Discharge status: Home / Self Care | Payer: Medicare Other | Source: Ambulatory Visit | Attending: Endocrinology | Admitting: Endocrinology

## 2017-03-29 DIAGNOSIS — Z1231 Encounter for screening mammogram for malignant neoplasm of breast: Secondary | ICD-10-CM

## 2017-04-04 DIAGNOSIS — J4551 Severe persistent asthma with (acute) exacerbation: Secondary | ICD-10-CM | POA: Diagnosis not present

## 2017-04-04 DIAGNOSIS — H1013 Acute atopic conjunctivitis, bilateral: Secondary | ICD-10-CM | POA: Diagnosis not present

## 2017-04-04 DIAGNOSIS — R0609 Other forms of dyspnea: Secondary | ICD-10-CM | POA: Diagnosis not present

## 2017-04-04 DIAGNOSIS — R0600 Dyspnea, unspecified: Secondary | ICD-10-CM | POA: Diagnosis not present

## 2017-04-04 DIAGNOSIS — J301 Allergic rhinitis due to pollen: Secondary | ICD-10-CM | POA: Diagnosis not present

## 2017-04-04 DIAGNOSIS — R0689 Other abnormalities of breathing: Secondary | ICD-10-CM | POA: Diagnosis not present

## 2017-04-24 DIAGNOSIS — I25810 Atherosclerosis of coronary artery bypass graft(s) without angina pectoris: Secondary | ICD-10-CM | POA: Diagnosis not present

## 2017-04-24 DIAGNOSIS — E1129 Type 2 diabetes mellitus with other diabetic kidney complication: Secondary | ICD-10-CM | POA: Diagnosis not present

## 2017-04-24 DIAGNOSIS — M109 Gout, unspecified: Secondary | ICD-10-CM | POA: Diagnosis not present

## 2017-04-24 DIAGNOSIS — N183 Chronic kidney disease, stage 3 (moderate): Secondary | ICD-10-CM | POA: Diagnosis not present

## 2017-04-24 DIAGNOSIS — N2581 Secondary hyperparathyroidism of renal origin: Secondary | ICD-10-CM | POA: Diagnosis not present

## 2017-04-24 DIAGNOSIS — Z940 Kidney transplant status: Secondary | ICD-10-CM | POA: Diagnosis not present

## 2017-04-24 DIAGNOSIS — K31811 Angiodysplasia of stomach and duodenum with bleeding: Secondary | ICD-10-CM | POA: Diagnosis not present

## 2017-04-24 DIAGNOSIS — E785 Hyperlipidemia, unspecified: Secondary | ICD-10-CM | POA: Diagnosis not present

## 2017-04-24 DIAGNOSIS — I129 Hypertensive chronic kidney disease with stage 1 through stage 4 chronic kidney disease, or unspecified chronic kidney disease: Secondary | ICD-10-CM | POA: Diagnosis not present

## 2017-04-24 DIAGNOSIS — D631 Anemia in chronic kidney disease: Secondary | ICD-10-CM | POA: Diagnosis not present

## 2017-04-24 DIAGNOSIS — E669 Obesity, unspecified: Secondary | ICD-10-CM | POA: Diagnosis not present

## 2017-05-02 DIAGNOSIS — J4551 Severe persistent asthma with (acute) exacerbation: Secondary | ICD-10-CM | POA: Diagnosis not present

## 2017-06-05 DIAGNOSIS — J209 Acute bronchitis, unspecified: Secondary | ICD-10-CM | POA: Diagnosis not present

## 2017-06-05 DIAGNOSIS — J4541 Moderate persistent asthma with (acute) exacerbation: Secondary | ICD-10-CM | POA: Diagnosis not present

## 2017-07-17 DIAGNOSIS — J4551 Severe persistent asthma with (acute) exacerbation: Secondary | ICD-10-CM | POA: Diagnosis not present

## 2017-07-20 ENCOUNTER — Other Ambulatory Visit: Payer: Self-pay | Admitting: Cardiovascular Disease

## 2017-07-27 DIAGNOSIS — K31811 Angiodysplasia of stomach and duodenum with bleeding: Secondary | ICD-10-CM | POA: Diagnosis not present

## 2017-07-27 DIAGNOSIS — I129 Hypertensive chronic kidney disease with stage 1 through stage 4 chronic kidney disease, or unspecified chronic kidney disease: Secondary | ICD-10-CM | POA: Diagnosis not present

## 2017-07-27 DIAGNOSIS — E1129 Type 2 diabetes mellitus with other diabetic kidney complication: Secondary | ICD-10-CM | POA: Diagnosis not present

## 2017-07-27 DIAGNOSIS — D631 Anemia in chronic kidney disease: Secondary | ICD-10-CM | POA: Diagnosis not present

## 2017-07-27 DIAGNOSIS — N2581 Secondary hyperparathyroidism of renal origin: Secondary | ICD-10-CM | POA: Diagnosis not present

## 2017-07-27 DIAGNOSIS — Z940 Kidney transplant status: Secondary | ICD-10-CM | POA: Diagnosis not present

## 2017-07-27 DIAGNOSIS — M109 Gout, unspecified: Secondary | ICD-10-CM | POA: Diagnosis not present

## 2017-07-27 DIAGNOSIS — N183 Chronic kidney disease, stage 3 (moderate): Secondary | ICD-10-CM | POA: Diagnosis not present

## 2017-07-27 DIAGNOSIS — E785 Hyperlipidemia, unspecified: Secondary | ICD-10-CM | POA: Diagnosis not present

## 2017-07-27 DIAGNOSIS — E669 Obesity, unspecified: Secondary | ICD-10-CM | POA: Diagnosis not present

## 2017-07-27 DIAGNOSIS — I25810 Atherosclerosis of coronary artery bypass graft(s) without angina pectoris: Secondary | ICD-10-CM | POA: Diagnosis not present

## 2017-08-10 DIAGNOSIS — N183 Chronic kidney disease, stage 3 (moderate): Secondary | ICD-10-CM | POA: Diagnosis not present

## 2017-08-30 DIAGNOSIS — D126 Benign neoplasm of colon, unspecified: Secondary | ICD-10-CM | POA: Diagnosis not present

## 2017-08-30 DIAGNOSIS — Z86010 Personal history of colonic polyps: Secondary | ICD-10-CM | POA: Diagnosis not present

## 2017-08-30 DIAGNOSIS — K640 First degree hemorrhoids: Secondary | ICD-10-CM | POA: Diagnosis not present

## 2017-09-04 DIAGNOSIS — D126 Benign neoplasm of colon, unspecified: Secondary | ICD-10-CM | POA: Diagnosis not present

## 2017-10-01 DIAGNOSIS — Z4822 Encounter for aftercare following kidney transplant: Secondary | ICD-10-CM | POA: Diagnosis not present

## 2017-10-01 DIAGNOSIS — B259 Cytomegaloviral disease, unspecified: Secondary | ICD-10-CM | POA: Diagnosis not present

## 2017-10-01 DIAGNOSIS — E139 Other specified diabetes mellitus without complications: Secondary | ICD-10-CM | POA: Diagnosis not present

## 2017-10-01 DIAGNOSIS — Z951 Presence of aortocoronary bypass graft: Secondary | ICD-10-CM | POA: Diagnosis not present

## 2017-10-01 DIAGNOSIS — Z23 Encounter for immunization: Secondary | ICD-10-CM | POA: Diagnosis not present

## 2017-10-01 DIAGNOSIS — I2510 Atherosclerotic heart disease of native coronary artery without angina pectoris: Secondary | ICD-10-CM | POA: Diagnosis not present

## 2017-10-01 DIAGNOSIS — Z940 Kidney transplant status: Secondary | ICD-10-CM | POA: Diagnosis not present

## 2017-10-01 DIAGNOSIS — J45909 Unspecified asthma, uncomplicated: Secondary | ICD-10-CM | POA: Diagnosis not present

## 2017-10-01 DIAGNOSIS — Z79899 Other long term (current) drug therapy: Secondary | ICD-10-CM | POA: Diagnosis not present

## 2017-10-01 DIAGNOSIS — D899 Disorder involving the immune mechanism, unspecified: Secondary | ICD-10-CM | POA: Diagnosis not present

## 2017-10-01 DIAGNOSIS — I10 Essential (primary) hypertension: Secondary | ICD-10-CM | POA: Diagnosis not present

## 2017-10-23 DIAGNOSIS — J4550 Severe persistent asthma, uncomplicated: Secondary | ICD-10-CM | POA: Diagnosis not present

## 2017-11-02 DIAGNOSIS — I25810 Atherosclerosis of coronary artery bypass graft(s) without angina pectoris: Secondary | ICD-10-CM | POA: Diagnosis not present

## 2017-11-02 DIAGNOSIS — D631 Anemia in chronic kidney disease: Secondary | ICD-10-CM | POA: Diagnosis not present

## 2017-11-02 DIAGNOSIS — Z940 Kidney transplant status: Secondary | ICD-10-CM | POA: Diagnosis not present

## 2017-11-02 DIAGNOSIS — N2581 Secondary hyperparathyroidism of renal origin: Secondary | ICD-10-CM | POA: Diagnosis not present

## 2017-11-02 DIAGNOSIS — J4521 Mild intermittent asthma with (acute) exacerbation: Secondary | ICD-10-CM | POA: Diagnosis not present

## 2017-11-02 DIAGNOSIS — I129 Hypertensive chronic kidney disease with stage 1 through stage 4 chronic kidney disease, or unspecified chronic kidney disease: Secondary | ICD-10-CM | POA: Diagnosis not present

## 2017-11-02 DIAGNOSIS — N183 Chronic kidney disease, stage 3 (moderate): Secondary | ICD-10-CM | POA: Diagnosis not present

## 2017-11-02 DIAGNOSIS — E1122 Type 2 diabetes mellitus with diabetic chronic kidney disease: Secondary | ICD-10-CM | POA: Diagnosis not present

## 2017-11-02 DIAGNOSIS — M109 Gout, unspecified: Secondary | ICD-10-CM | POA: Diagnosis not present

## 2017-11-02 DIAGNOSIS — E785 Hyperlipidemia, unspecified: Secondary | ICD-10-CM | POA: Diagnosis not present

## 2017-11-02 DIAGNOSIS — K210 Gastro-esophageal reflux disease with esophagitis: Secondary | ICD-10-CM | POA: Diagnosis not present

## 2017-12-05 DIAGNOSIS — J45909 Unspecified asthma, uncomplicated: Secondary | ICD-10-CM | POA: Diagnosis not present

## 2017-12-07 DIAGNOSIS — J45909 Unspecified asthma, uncomplicated: Secondary | ICD-10-CM | POA: Diagnosis not present

## 2017-12-07 DIAGNOSIS — Z940 Kidney transplant status: Secondary | ICD-10-CM | POA: Diagnosis not present

## 2017-12-07 DIAGNOSIS — N390 Urinary tract infection, site not specified: Secondary | ICD-10-CM | POA: Diagnosis not present

## 2017-12-07 DIAGNOSIS — Z5181 Encounter for therapeutic drug level monitoring: Secondary | ICD-10-CM | POA: Diagnosis not present

## 2017-12-07 DIAGNOSIS — Z9483 Pancreas transplant status: Secondary | ICD-10-CM | POA: Diagnosis not present

## 2017-12-25 DIAGNOSIS — R05 Cough: Secondary | ICD-10-CM | POA: Diagnosis not present

## 2017-12-25 DIAGNOSIS — J4550 Severe persistent asthma, uncomplicated: Secondary | ICD-10-CM | POA: Diagnosis not present

## 2017-12-25 DIAGNOSIS — R0609 Other forms of dyspnea: Secondary | ICD-10-CM | POA: Diagnosis not present

## 2018-01-07 DIAGNOSIS — Z940 Kidney transplant status: Secondary | ICD-10-CM | POA: Diagnosis not present

## 2018-01-07 DIAGNOSIS — Z5181 Encounter for therapeutic drug level monitoring: Secondary | ICD-10-CM | POA: Diagnosis not present

## 2018-01-07 DIAGNOSIS — Z9483 Pancreas transplant status: Secondary | ICD-10-CM | POA: Diagnosis not present

## 2018-01-14 ENCOUNTER — Other Ambulatory Visit: Payer: Self-pay | Admitting: Cardiovascular Disease

## 2018-01-14 MED ORDER — ROSUVASTATIN CALCIUM 10 MG PO TABS: 5.0000 mg | ORAL_TABLET | Freq: Every day | ORAL | 1 refills | Status: DC

## 2018-01-17 DIAGNOSIS — Z940 Kidney transplant status: Secondary | ICD-10-CM | POA: Diagnosis not present

## 2018-01-17 DIAGNOSIS — Z5181 Encounter for therapeutic drug level monitoring: Secondary | ICD-10-CM | POA: Diagnosis not present

## 2018-01-17 DIAGNOSIS — Z9483 Pancreas transplant status: Secondary | ICD-10-CM | POA: Diagnosis not present

## 2018-01-31 DIAGNOSIS — N183 Chronic kidney disease, stage 3 (moderate): Secondary | ICD-10-CM | POA: Diagnosis not present

## 2018-01-31 DIAGNOSIS — I25810 Atherosclerosis of coronary artery bypass graft(s) without angina pectoris: Secondary | ICD-10-CM | POA: Diagnosis not present

## 2018-01-31 DIAGNOSIS — J4521 Mild intermittent asthma with (acute) exacerbation: Secondary | ICD-10-CM | POA: Diagnosis not present

## 2018-01-31 DIAGNOSIS — Z940 Kidney transplant status: Secondary | ICD-10-CM | POA: Diagnosis not present

## 2018-01-31 DIAGNOSIS — E1122 Type 2 diabetes mellitus with diabetic chronic kidney disease: Secondary | ICD-10-CM | POA: Diagnosis not present

## 2018-01-31 DIAGNOSIS — N2581 Secondary hyperparathyroidism of renal origin: Secondary | ICD-10-CM | POA: Diagnosis not present

## 2018-01-31 DIAGNOSIS — K31811 Angiodysplasia of stomach and duodenum with bleeding: Secondary | ICD-10-CM | POA: Diagnosis not present

## 2018-01-31 DIAGNOSIS — E785 Hyperlipidemia, unspecified: Secondary | ICD-10-CM | POA: Diagnosis not present

## 2018-01-31 DIAGNOSIS — I129 Hypertensive chronic kidney disease with stage 1 through stage 4 chronic kidney disease, or unspecified chronic kidney disease: Secondary | ICD-10-CM | POA: Diagnosis not present

## 2018-01-31 DIAGNOSIS — K210 Gastro-esophageal reflux disease with esophagitis: Secondary | ICD-10-CM | POA: Diagnosis not present

## 2018-01-31 DIAGNOSIS — M109 Gout, unspecified: Secondary | ICD-10-CM | POA: Diagnosis not present

## 2018-02-04 DIAGNOSIS — Z940 Kidney transplant status: Secondary | ICD-10-CM | POA: Diagnosis not present

## 2018-02-04 DIAGNOSIS — Z9483 Pancreas transplant status: Secondary | ICD-10-CM | POA: Diagnosis not present

## 2018-02-04 DIAGNOSIS — Z5181 Encounter for therapeutic drug level monitoring: Secondary | ICD-10-CM | POA: Diagnosis not present

## 2018-02-12 ENCOUNTER — Other Ambulatory Visit: Payer: Self-pay

## 2018-02-12 ENCOUNTER — Emergency Department (HOSPITAL_COMMUNITY): Payer: Medicare Other

## 2018-02-12 ENCOUNTER — Observation Stay (HOSPITAL_COMMUNITY): Payer: Medicare Other

## 2018-02-12 ENCOUNTER — Encounter (HOSPITAL_COMMUNITY): Payer: Self-pay | Admitting: *Deleted

## 2018-02-12 ENCOUNTER — Inpatient Hospital Stay (HOSPITAL_COMMUNITY)
Admission: EM | Admit: 2018-02-12 | Discharge: 2018-02-21 | DRG: 202 | Disposition: A | Discharge status: Home Health | Payer: Medicare Other | Attending: Family Medicine | Admitting: Family Medicine

## 2018-02-12 DIAGNOSIS — E785 Hyperlipidemia, unspecified: Secondary | ICD-10-CM | POA: Diagnosis present

## 2018-02-12 DIAGNOSIS — N179 Acute kidney failure, unspecified: Secondary | ICD-10-CM | POA: Diagnosis present

## 2018-02-12 DIAGNOSIS — Z7951 Long term (current) use of inhaled steroids: Secondary | ICD-10-CM

## 2018-02-12 DIAGNOSIS — J4551 Severe persistent asthma with (acute) exacerbation: Principal | ICD-10-CM | POA: Diagnosis present

## 2018-02-12 DIAGNOSIS — N2581 Secondary hyperparathyroidism of renal origin: Secondary | ICD-10-CM | POA: Diagnosis present

## 2018-02-12 DIAGNOSIS — I252 Old myocardial infarction: Secondary | ICD-10-CM

## 2018-02-12 DIAGNOSIS — H109 Unspecified conjunctivitis: Secondary | ICD-10-CM | POA: Diagnosis present

## 2018-02-12 DIAGNOSIS — Z951 Presence of aortocoronary bypass graft: Secondary | ICD-10-CM

## 2018-02-12 DIAGNOSIS — D649 Anemia, unspecified: Secondary | ICD-10-CM | POA: Diagnosis present

## 2018-02-12 DIAGNOSIS — R600 Localized edema: Secondary | ICD-10-CM | POA: Diagnosis not present

## 2018-02-12 DIAGNOSIS — Z940 Kidney transplant status: Secondary | ICD-10-CM

## 2018-02-12 DIAGNOSIS — E1122 Type 2 diabetes mellitus with diabetic chronic kidney disease: Secondary | ICD-10-CM | POA: Diagnosis present

## 2018-02-12 DIAGNOSIS — Z87891 Personal history of nicotine dependence: Secondary | ICD-10-CM

## 2018-02-12 DIAGNOSIS — I129 Hypertensive chronic kidney disease with stage 1 through stage 4 chronic kidney disease, or unspecified chronic kidney disease: Secondary | ICD-10-CM | POA: Diagnosis present

## 2018-02-12 DIAGNOSIS — M810 Age-related osteoporosis without current pathological fracture: Secondary | ICD-10-CM | POA: Diagnosis present

## 2018-02-12 DIAGNOSIS — D696 Thrombocytopenia, unspecified: Secondary | ICD-10-CM | POA: Diagnosis present

## 2018-02-12 DIAGNOSIS — R0682 Tachypnea, not elsewhere classified: Secondary | ICD-10-CM | POA: Diagnosis not present

## 2018-02-12 DIAGNOSIS — Z20811 Contact with and (suspected) exposure to meningococcus: Secondary | ICD-10-CM | POA: Diagnosis present

## 2018-02-12 DIAGNOSIS — I10 Essential (primary) hypertension: Secondary | ICD-10-CM | POA: Diagnosis present

## 2018-02-12 DIAGNOSIS — G43909 Migraine, unspecified, not intractable, without status migrainosus: Secondary | ICD-10-CM | POA: Diagnosis present

## 2018-02-12 DIAGNOSIS — Z886 Allergy status to analgesic agent status: Secondary | ICD-10-CM

## 2018-02-12 DIAGNOSIS — I2510 Atherosclerotic heart disease of native coronary artery without angina pectoris: Secondary | ICD-10-CM | POA: Diagnosis present

## 2018-02-12 DIAGNOSIS — D6959 Other secondary thrombocytopenia: Secondary | ICD-10-CM | POA: Diagnosis present

## 2018-02-12 DIAGNOSIS — Z79899 Other long term (current) drug therapy: Secondary | ICD-10-CM

## 2018-02-12 DIAGNOSIS — R062 Wheezing: Secondary | ICD-10-CM | POA: Diagnosis not present

## 2018-02-12 DIAGNOSIS — Z888 Allergy status to other drugs, medicaments and biological substances status: Secondary | ICD-10-CM

## 2018-02-12 DIAGNOSIS — Z96642 Presence of left artificial hip joint: Secondary | ICD-10-CM | POA: Diagnosis present

## 2018-02-12 DIAGNOSIS — R0602 Shortness of breath: Secondary | ICD-10-CM | POA: Diagnosis not present

## 2018-02-12 DIAGNOSIS — J310 Chronic rhinitis: Secondary | ICD-10-CM | POA: Diagnosis present

## 2018-02-12 DIAGNOSIS — J9809 Other diseases of bronchus, not elsewhere classified: Secondary | ICD-10-CM | POA: Diagnosis present

## 2018-02-12 DIAGNOSIS — Z7984 Long term (current) use of oral hypoglycemic drugs: Secondary | ICD-10-CM

## 2018-02-12 DIAGNOSIS — N183 Chronic kidney disease, stage 3 (moderate): Secondary | ICD-10-CM | POA: Diagnosis present

## 2018-02-12 DIAGNOSIS — Z7952 Long term (current) use of systemic steroids: Secondary | ICD-10-CM

## 2018-02-12 DIAGNOSIS — D631 Anemia in chronic kidney disease: Secondary | ICD-10-CM | POA: Diagnosis present

## 2018-02-12 DIAGNOSIS — J9601 Acute respiratory failure with hypoxia: Secondary | ICD-10-CM | POA: Diagnosis present

## 2018-02-12 DIAGNOSIS — E119 Type 2 diabetes mellitus without complications: Secondary | ICD-10-CM

## 2018-02-12 DIAGNOSIS — M109 Gout, unspecified: Secondary | ICD-10-CM | POA: Diagnosis present

## 2018-02-12 DIAGNOSIS — Z92241 Personal history of systemic steroid therapy: Secondary | ICD-10-CM

## 2018-02-12 DIAGNOSIS — R05 Cough: Secondary | ICD-10-CM | POA: Diagnosis not present

## 2018-02-12 DIAGNOSIS — K219 Gastro-esophageal reflux disease without esophagitis: Secondary | ICD-10-CM

## 2018-02-12 DIAGNOSIS — Z7982 Long term (current) use of aspirin: Secondary | ICD-10-CM

## 2018-02-12 DIAGNOSIS — R911 Solitary pulmonary nodule: Secondary | ICD-10-CM | POA: Diagnosis present

## 2018-02-12 DIAGNOSIS — E875 Hyperkalemia: Secondary | ICD-10-CM | POA: Diagnosis not present

## 2018-02-12 LAB — BASIC METABOLIC PANEL
Anion gap: 10 (ref 5–15)
BUN: 13 mg/dL (ref 6–20)
CO2: 27 mmol/L (ref 22–32)
Calcium: 9.3 mg/dL (ref 8.9–10.3)
Chloride: 104 mmol/L (ref 101–111)
Creatinine, Ser: 1.21 mg/dL — ABNORMAL HIGH (ref 0.44–1.00)
GFR calc non Af Amer: 43 mL/min — ABNORMAL LOW (ref >60)
GFR, EST AFRICAN AMERICAN: 49 mL/min — AB (ref >60)
Glucose, Bld: 95 mg/dL (ref 65–99)
POTASSIUM: 3.6 mmol/L (ref 3.5–5.1)
Sodium: 141 mmol/L (ref 135–145)

## 2018-02-12 LAB — CBG MONITORING, ED
Glucose-Capillary: 242 mg/dL — ABNORMAL HIGH (ref 65–99)
Glucose-Capillary: 352 mg/dL — ABNORMAL HIGH (ref 65–99)

## 2018-02-12 LAB — URINALYSIS, ROUTINE W REFLEX MICROSCOPIC
BILIRUBIN URINE: NEGATIVE
Glucose, UA: NEGATIVE mg/dL
HGB URINE DIPSTICK: NEGATIVE
KETONES UR: 5 mg/dL — AB
LEUKOCYTES UA: NEGATIVE
Nitrite: NEGATIVE
PROTEIN: 100 mg/dL — AB
pH: 5 (ref 5.0–8.0)

## 2018-02-12 LAB — CBC
HEMATOCRIT: 39.7 % (ref 36.0–46.0)
HEMOGLOBIN: 13.4 g/dL (ref 12.0–15.0)
MCH: 25.7 pg — AB (ref 26.0–34.0)
MCHC: 33.8 g/dL (ref 30.0–36.0)
MCV: 76.1 fL — ABNORMAL LOW (ref 78.0–100.0)
Platelets: 137 10*3/uL — ABNORMAL LOW (ref 150–400)
RBC: 5.22 MIL/uL — AB (ref 3.87–5.11)
RDW: 17.3 % — ABNORMAL HIGH (ref 11.5–15.5)
WBC: 8.3 10*3/uL (ref 4.0–10.5)

## 2018-02-12 LAB — BRAIN NATRIURETIC PEPTIDE: B Natriuretic Peptide: 91.5 pg/mL (ref 0.0–100.0)

## 2018-02-12 LAB — I-STAT TROPONIN, ED: Troponin i, poc: 0 ng/mL (ref 0.00–0.08)

## 2018-02-12 LAB — MRSA PCR SCREENING: MRSA by PCR: NEGATIVE

## 2018-02-12 MED ORDER — BISACODYL 10 MG RE SUPP: 10.0000 mg | Freq: Every day | RECTAL | Status: DC | PRN

## 2018-02-12 MED ORDER — HYDROCODONE-ACETAMINOPHEN 5-325 MG PO TABS: 1.0000 | ORAL_TABLET | ORAL | Status: DC | PRN

## 2018-02-12 MED ORDER — ENOXAPARIN SODIUM 40 MG/0.4ML ~~LOC~~ SOLN
40.0000 mg | SUBCUTANEOUS | Status: DC
  Administered 2018-02-12: 40 mg via SUBCUTANEOUS
  Filled 2018-02-12 (×2): qty 0.4

## 2018-02-12 MED ORDER — ROSUVASTATIN CALCIUM 10 MG PO TABS
5.0000 mg | ORAL_TABLET | Freq: Every day | ORAL | Status: DC
  Administered 2018-02-12 – 2018-02-13 (×2): 5 mg via ORAL
  Filled 2018-02-12 (×3): qty 1

## 2018-02-12 MED ORDER — MORPHINE SULFATE (PF) 4 MG/ML IV SOLN
2.0000 mg | INTRAVENOUS | Status: DC | PRN
  Administered 2018-02-13 – 2018-02-18 (×10): 2 mg via INTRAVENOUS
  Filled 2018-02-12 (×10): qty 1

## 2018-02-12 MED ORDER — IOPAMIDOL (ISOVUE-370) INJECTION 76%
INTRAVENOUS | Status: AC
  Administered 2018-02-12: 100 mL
  Filled 2018-02-12: qty 100

## 2018-02-12 MED ORDER — ONDANSETRON HCL 4 MG PO TABS: 4.0000 mg | ORAL_TABLET | Freq: Four times a day (QID) | ORAL | Status: DC | PRN

## 2018-02-12 MED ORDER — AZELASTINE HCL 0.1 % NA SOLN
1.0000 | Freq: Two times a day (BID) | NASAL | Status: DC
  Administered 2018-02-12 – 2018-02-21 (×18): 1 via NASAL
  Filled 2018-02-12: qty 30

## 2018-02-12 MED ORDER — INSULIN ASPART 100 UNIT/ML ~~LOC~~ SOLN
0.0000 [IU] | Freq: Three times a day (TID) | SUBCUTANEOUS | Status: DC
  Administered 2018-02-12: 15 [IU] via SUBCUTANEOUS
  Administered 2018-02-12: 5 [IU] via SUBCUTANEOUS
  Administered 2018-02-13: 3 [IU] via SUBCUTANEOUS
  Administered 2018-02-13 – 2018-02-14 (×3): 5 [IU] via SUBCUTANEOUS
  Administered 2018-02-14: 2 [IU] via SUBCUTANEOUS
  Administered 2018-02-14 – 2018-02-15 (×2): 3 [IU] via SUBCUTANEOUS
  Administered 2018-02-15: 5 [IU] via SUBCUTANEOUS
  Administered 2018-02-15 – 2018-02-16 (×3): 3 [IU] via SUBCUTANEOUS
  Filled 2018-02-12 (×2): qty 1

## 2018-02-12 MED ORDER — SENNOSIDES-DOCUSATE SODIUM 8.6-50 MG PO TABS
1.0000 | ORAL_TABLET | Freq: Every evening | ORAL | Status: DC | PRN
  Administered 2018-02-16: 1 via ORAL
  Filled 2018-02-12: qty 1

## 2018-02-12 MED ORDER — ACETAMINOPHEN 650 MG RE SUPP: 650.0000 mg | Freq: Four times a day (QID) | RECTAL | Status: DC | PRN

## 2018-02-12 MED ORDER — FLUTICASONE PROPIONATE 50 MCG/ACT NA SUSP
1.0000 | Freq: Two times a day (BID) | NASAL | Status: DC
  Administered 2018-02-12 – 2018-02-21 (×18): 1 via NASAL
  Filled 2018-02-12: qty 16

## 2018-02-12 MED ORDER — IPRATROPIUM-ALBUTEROL 0.5-2.5 (3) MG/3ML IN SOLN
RESPIRATORY_TRACT | Status: AC
  Filled 2018-02-12: qty 3

## 2018-02-12 MED ORDER — ACETAMINOPHEN 325 MG PO TABS
650.0000 mg | ORAL_TABLET | Freq: Four times a day (QID) | ORAL | Status: DC | PRN
  Administered 2018-02-12 – 2018-02-17 (×4): 650 mg via ORAL
  Filled 2018-02-12 (×5): qty 2

## 2018-02-12 MED ORDER — AMLODIPINE BESYLATE 10 MG PO TABS
10.0000 mg | ORAL_TABLET | Freq: Every morning | ORAL | Status: DC
  Administered 2018-02-12 – 2018-02-21 (×10): 10 mg via ORAL
  Filled 2018-02-12 (×8): qty 1
  Filled 2018-02-12: qty 2
  Filled 2018-02-12: qty 1

## 2018-02-12 MED ORDER — ASPIRIN 81 MG PO CHEW
81.0000 mg | CHEWABLE_TABLET | Freq: Every day | ORAL | Status: DC
  Administered 2018-02-13 – 2018-02-21 (×9): 81 mg via ORAL
  Filled 2018-02-12 (×9): qty 1

## 2018-02-12 MED ORDER — CALCITRIOL 0.25 MCG PO CAPS
0.2500 ug | ORAL_CAPSULE | Freq: Every day | ORAL | Status: DC
  Administered 2018-02-12 – 2018-02-21 (×10): 0.25 ug via ORAL
  Filled 2018-02-12 (×10): qty 1

## 2018-02-12 MED ORDER — METHYLPREDNISOLONE SODIUM SUCC 125 MG IJ SOLR
125.0000 mg | Freq: Once | INTRAMUSCULAR | Status: AC
  Administered 2018-02-12: 125 mg via INTRAVENOUS
  Filled 2018-02-12: qty 2

## 2018-02-12 MED ORDER — ALBUTEROL SULFATE (2.5 MG/3ML) 0.083% IN NEBU
INHALATION_SOLUTION | RESPIRATORY_TRACT | Status: AC
  Administered 2018-02-12: 09:00:00
  Filled 2018-02-12: qty 6

## 2018-02-12 MED ORDER — PANTOPRAZOLE SODIUM 40 MG PO TBEC
40.0000 mg | DELAYED_RELEASE_TABLET | Freq: Every day | ORAL | Status: DC
  Administered 2018-02-12 – 2018-02-21 (×10): 40 mg via ORAL
  Filled 2018-02-12 (×9): qty 1

## 2018-02-12 MED ORDER — METHYLPREDNISOLONE SODIUM SUCC 125 MG IJ SOLR
125.0000 mg | Freq: Three times a day (TID) | INTRAMUSCULAR | Status: DC
  Administered 2018-02-12 – 2018-02-13 (×3): 125 mg via INTRAVENOUS
  Filled 2018-02-12 (×3): qty 2

## 2018-02-12 MED ORDER — IPRATROPIUM-ALBUTEROL 0.5-2.5 (3) MG/3ML IN SOLN
3.0000 mL | RESPIRATORY_TRACT | Status: DC
  Administered 2018-02-12 – 2018-02-13 (×6): 3 mL via RESPIRATORY_TRACT
  Filled 2018-02-12 (×5): qty 3

## 2018-02-12 MED ORDER — ALBUTEROL SULFATE (2.5 MG/3ML) 0.083% IN NEBU: 2.5000 mg | INHALATION_SOLUTION | Freq: Two times a day (BID) | RESPIRATORY_TRACT | Status: DC | PRN

## 2018-02-12 MED ORDER — MAGNESIUM SULFATE 2 GM/50ML IV SOLN
2.0000 g | Freq: Once | INTRAVENOUS | Status: AC
  Administered 2018-02-12: 2 g via INTRAVENOUS
  Filled 2018-02-12: qty 50

## 2018-02-12 MED ORDER — NITROGLYCERIN 0.4 MG SL SUBL
0.4000 mg | SUBLINGUAL_TABLET | SUBLINGUAL | Status: DC | PRN
Start: 2018-02-12 — End: 2018-02-21
  Administered 2018-02-13 (×3): 0.4 mg via SUBLINGUAL
  Filled 2018-02-12 (×3): qty 1

## 2018-02-12 MED ORDER — ONDANSETRON HCL 4 MG/2ML IJ SOLN: 4.0000 mg | Freq: Four times a day (QID) | INTRAMUSCULAR | Status: DC | PRN

## 2018-02-12 MED ORDER — ALBUTEROL SULFATE (2.5 MG/3ML) 0.083% IN NEBU: 2.5000 mg | INHALATION_SOLUTION | RESPIRATORY_TRACT | Status: DC | PRN

## 2018-02-12 MED ORDER — TACROLIMUS 1 MG PO CAPS
6.0000 mg | ORAL_CAPSULE | Freq: Two times a day (BID) | ORAL | Status: DC
  Administered 2018-02-12 – 2018-02-13 (×3): 6 mg via ORAL
  Filled 2018-02-12 (×4): qty 6

## 2018-02-12 MED ORDER — GLIPIZIDE 5 MG PO TABS
5.0000 mg | ORAL_TABLET | Freq: Every day | ORAL | Status: DC
  Filled 2018-02-12: qty 1

## 2018-02-12 MED ORDER — ALBUTEROL (5 MG/ML) CONTINUOUS INHALATION SOLN
10.0000 mg/h | INHALATION_SOLUTION | Freq: Once | RESPIRATORY_TRACT | Status: AC
  Administered 2018-02-12: 10 mg/h via RESPIRATORY_TRACT
  Filled 2018-02-12: qty 20

## 2018-02-12 MED ORDER — ASPIRIN EC 81 MG PO TBEC: 81.0000 mg | DELAYED_RELEASE_TABLET | Freq: Every day | ORAL | Status: DC

## 2018-02-12 NOTE — H&P (Signed)
History and Physical    [name]:[id number] DOB: [birth year] DOA: 02/12/2018   PCP: Renato Shin, MD   Patient coming from:  Home    Chief Complaint: Shortness of breath  HPI: [name] is a 77 y.o. female with medical history significant for chronic asthma, CAD status post cardiac catheterization in January 2009, status post MI in 2011, diabetes, history of ESRD status post dialysis, now status post renal transplant since September 2012, on tacrolimus and chronic steroid followed at [facility], GERD, history of gout, hypertension, hyperlipidemia, hyperparathyroidism, presenting today with severe shortness of breath.  Patient has chronic issues with her asthma, usually able to treated with nebulizers at home, however she failed those treatments, feeling that this was getting worse.  She also reported dry cough.  She denies any fever or chills. Denies sick contacts, She denies any swelling in the lower extremities.  She reports substernal in the lower portion of her chest some tightness with deep inspiration, she denies any palpitations.  The patient denies any prior hospitalization or ICU admission regarding her breathing issues.   ED Course:  BP (!) 133/57   Pulse (!) 115   Temp 97.9 F (36.6 C) (Oral)   Resp 19   Ht 5' 2.5" (1.588 m)   Wt 73.9 kg (163 lb)   SpO2 99%   BMI 29.34 kg/m   At the ED, the patient received 3 breathing treatments, with continuous nebulizer, and Solu-Medrol 125 mg x1, however she did not show much improvement. She was placed on BiPAP, with some respiratory comfort. He also received a dose of magnesium. Glucose 95 Creatinine 1.21, stable GFR 49 BNP 91.5 Troponin 0 White count 8.3, hemoglobin 13.4, platelets 137 (likely related to tacrolimus) Chest x-ray showed edema or consolidation  Review of Systems:  As per HPI otherwise all other systems reviewed and are negative  Past Medical History:  Diagnosis Date  . 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 Asthma   . Coronary artery disease    cath 11/2007, grafts patent /  Nuclear, June, 2011, prior inferior MI with mild peri-infarct ischemia, anterior breast attenuation, EF 67%, done for renal transplant assessment / Low level exercise/Lexiscan Myoview (11/2013): EF 67%, no ischemi; normal study  . Diabetes mellitus    type 2  . ESRD on dialysis [facility])    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, 2012  . GERD (gastroesophageal reflux disease)   . GI bleed 11/26/2010   January, 2012 , AVM  . Gout   . History of methicillin resistant staphylococcus aureus (MRSA)   . Hyperlipidemia   . Hyperparathyroidism   . Hypertension   . Myocardial infarction ([place])   . Osteoporosis   . Pneumonia 08/2010    Hospitalization, October, 2011  . Primary osteoarthritis, 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  . S/P kidney transplant    September, 2012, [facility]  . Subacromial impingement of 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    Past Surgical History:  Procedure Laterality Date  . ABDOMINAL HYSTERECTOMY  1980'S  . ARTERIOVENOUS GRAFT PLACEMENT Left 03/04/2007   forearm  . BREAST EXCISIONAL BIOPSY    . BREAST SURGERY  YRS AGO   RT BREAST CYST REMOVED   . CARDIAC CATHETERIZATION  06/03/2002; 12/04/2007  . COLONOSCOPY WITH PROPOFOL N/A 07/14/2014   Procedure: COLONOSCOPY WITH PROPOFOL;  Surgeon: Lear Ng, MD;  Location: [facility] ENDOSCOPY;  Service: Endoscopy;  Laterality:  N/A;  . CORONARY ARTERY BYPASS GRAFT  06/06/2002   x 4  . HOT HEMOSTASIS N/A 07/14/2014   Procedure: HOT HEMOSTASIS (ARGON PLASMA COAGULATION/BICAP);  Surgeon: Lear Ng, MD;  Location: [facility];  Service: Endoscopy;  Laterality: N/A;  . KIDNEY TRANSPLANT Right 08/04/2011  . ORIF PATELLA Left 04/06/2016   Procedure: OPEN REDUCTION INTERNAL (ORIF) LEFT  PATELLA;  Surgeon: Ninetta Lights, MD;  Location: [place];  Service: Orthopedics;  Laterality: Left;  .  SHOULDER ARTHROSCOPY WITH ROTATOR CUFF REPAIR AND SUBACROMIAL DECOMPRESSION Left 08/03/2016   Procedure: LEFT SHOULDER ARTHROSCOPY WITH ROTATOR CUFF REPAIR AND SUBACROMIAL DECOMPRESSION with distal claviculectomy and extentsive debridement;  Surgeon: Ninetta Lights, MD;  Location: [place];  Service: Orthopedics;  Laterality: Left;  Block  . THROMBECTOMY AND REVISION OF ARTERIOVENTOUS (AV) GORETEX  GRAFT Left 05/08/2007   forearm  . TOTAL HIP ARTHROPLASTY  12/18/2012   Procedure: TOTAL HIP ARTHROPLASTY;  Surgeon: Ninetta Lights, MD;  Location: [place];  Service: Orthopedics;  Laterality: Left;  Marland Kitchen VESICOVAGINAL FISTULA CLOSURE W/ TAH  1984    Social History Social History   Socioeconomic History  . Marital status: Divorced    Spouse name: Not on file  . Number of children: Not on file  . Years of education: Not on file  . Highest education level: Not on file  Occupational History  . Not on file  Social Needs  . Financial resource strain: Not on file  . Food insecurity:    Worry: Not on file    Inability: Not on file  . Transportation needs:    Medical: Not on file    Non-medical: Not on file  Tobacco Use  . Smoking status: Former Smoker    Packs/day: 1.00    Years: 8.00    Pack years: 8.00    Types: Cigarettes    Last attempt to quit: 11/21/1983    Years since quitting: 34.2  . Smokeless tobacco: Never Used  Substance and Sexual Activity  . Alcohol use: No  . Drug use: No  . Sexual activity: Not on file  Lifestyle  . Physical activity:    Days per week: Not on file    Minutes per session: Not on file  . Stress: Not on file  Relationships  . Social connections:    Talks on phone: Not on file    Gets together: Not on file    Attends religious service: Not on file    Active member of club or organization: Not on file    Attends meetings of clubs or organizations: Not on file    Relationship status: Not on file  . Intimate partner violence:    Fear of  current or ex partner: Not on file    Emotionally abused: Not on file    Physically abused: Not on file    Forced sexual activity: Not on file  Other Topics Concern  . Not on file  Social History Narrative  . Not on file     Allergies  Allergen Reactions  . Lactose Other (See Comments)  . Asa Arthritis Strength-Antacid [Aspirin Buffered] Other (See Comments)    STOMACH BURNS, N/V   . Aspirin Nausea And Vomiting    Per pt. "can tolerate the enteric coated tablets".   . Atorvastatin Other (See Comments)    Patient's skin was skin was sensitive  . Banana Nausea And Vomiting  . Lactose Intolerance (Gi) Nausea And Vomiting  .  Lisinopril Cough    Family History  Problem Relation Age of Onset  . Breast cancer Sister   . Cancer Neg Hx       Prior to Admission medications   Medication Sig Start Date End Date Taking? Authorizing Provider  albuterol (PROVENTIL) (2.5 MG/3ML) 0.083% nebulizer solution Inhale 2.5 mg into the lungs every 6 (six) hours as needed for wheezing. 06/05/17 06/05/18 Yes [provider]  amLODipine (NORVASC) 10 MG tablet Take 10 mg by mouth every morning.    Yes [provider]  aspirin EC 81 MG tablet Take 81 mg by mouth daily.   Yes [provider]  Azelastine HCl 0.15 % SOLN Place 1 spray into the nose 2 (two) times daily. 03/13/12  Yes Kindl, Nelda Severe, MD  calcitRIOL (ROCALTROL) 0.25 MCG capsule Take 0.25 mcg by mouth daily. Alternate 2 tablets one day, 3 tablets the next day.   Yes [provider]  fluticasone (FLONASE) 50 MCG/ACT nasal spray Place 1 spray into both nostrils 2 (two) times daily.   Yes [provider]  fluticasone furoate-vilanterol (BREO ELLIPTA) 200-25 MCG/INH AEPB Inhale 1 puff into the lungs daily. 10/23/17  Yes [provider]  glipiZIDE (GLUCOTROL) 5 MG tablet Take 5 mg by mouth daily before breakfast.  01/25/12  Yes [provider]  omeprazole (PRILOSEC) 20 MG capsule Take 20 mg by  mouth daily.    Yes [provider]  predniSONE (DELTASONE) 5 MG tablet Take 5 mg by mouth daily. Reported on 03/07/2016   Yes [provider]  rosuvastatin (CRESTOR) 10 MG tablet Take 0.5 tablets (5 mg total) by mouth daily. Please make yearly appt with Dr. Acie Fredrickson for April. 1st attempt 01/14/18  Yes Nahser, Wonda Cheng, MD  tacrolimus (PROGRAF) 1 MG capsule Take 5 mg by mouth every 12 (twelve) hours. Pt takes medication at 9 am & 9 pm   Yes [provider]  Tiotropium Bromide Monohydrate (SPIRIVA RESPIMAT) 1.25 MCG/ACT AERS Inhale 1 puff into the lungs daily. 10/23/17  Yes [provider]  nitroGLYCERIN (NITROSTAT) 0.4 MG SL tablet Place 1 tablet (0.4 mg total) under the tongue every 5 (five) minutes as needed for chest pain. 03/07/16   Nahser, Wonda Cheng, MD  ondansetron (ZOFRAN) 4 MG tablet Take 1 tablet (4 mg total) by mouth every 8 (eight) hours as needed for nausea or vomiting. Patient not taking: Reported on 02/12/2018 04/06/16   Aundra Dubin, PA-C    Physical Exam:  Vitals:   02/12/18 0930 02/12/18 0945 02/12/18 1015 02/12/18 1145  BP:  133/65 (!) 146/57 (!) 133/57  Pulse:  (!) 102 (!) 122 (!) 115  Resp:  (!) 24 (!) 23 19  Temp:      TempSrc:      SpO2: 96% 99% 98% 99%  Weight:      Height:       Constitutional: In significant amount of discomfort, uncomfortable due to shortness of breath, tearful  eyes: PERRL, lids and conjunctivae normal ENMT: Mucous membranes are moist, without exudate or lesions  Neck: normal, supple, no masses, no thyromegaly Respiratory: Diffuse wheezing, inspiratory and expiratory, without crackles or rhonchi.  Cannot hear rubs.  Patient is tachypneic at this time Cardiovascular: Tachycardic rate and rhythm,  murmur, rubs or gallops. No extremity edema. 2+ pedal pulses. No carotid bruits.  Abdomen: Soft, obese non tender, No hepatosplenomegaly. Bowel sounds positive.  Musculoskeletal: no clubbing / cyanosis. Moves all  extremities Skin: no jaundice, No lesions.  Neurologic: Sensation intact  Strength equal in all extremities Psychiatric:   Alert and oriented x 3.  Very anxious    Labs on Admission: I have personally reviewed following labs and imaging studies  CBC: Recent Labs  Lab 02/12/18 0918  WBC 8.3  HGB 13.4  HCT 39.7  MCV 76.1*  PLT 137*    Basic Metabolic Panel: Recent Labs  Lab 02/12/18 0918  NA 141  K 3.6  CL 104  CO2 27  GLUCOSE 95  BUN 13  CREATININE 1.21*  CALCIUM 9.3    GFR: Estimated Creatinine Clearance: 38.2 mL/min (A) (by C-G formula based on SCr of 1.21 mg/dL (H)).  Liver Function Tests: No results for input(s): AST, ALT, ALKPHOS, BILITOT, PROT, ALBUMIN in the last 168 hours. No results for input(s): LIPASE, AMYLASE in the last 168 hours. No results for input(s): AMMONIA in the last 168 hours.  Coagulation Profile: No results for input(s): INR, PROTIME in the last 168 hours.  Cardiac Enzymes: No results for input(s): CKTOTAL, CKMB, CKMBINDEX, TROPONINI in the last 168 hours.  BNP (last 3 results) No results for input(s): PROBNP in the last 8760 hours.  HbA1C: No results for input(s): HGBA1C in the last 72 hours.  CBG: No results for input(s): GLUCAP in the last 168 hours.  Lipid Profile: No results for input(s): CHOL, HDL, LDLCALC, TRIG, CHOLHDL, LDLDIRECT in the last 72 hours.  Thyroid Function Tests: No results for input(s): TSH, T4TOTAL, FREET4, T3FREE, THYROIDAB in the last 72 hours.  Anemia Panel: No results for input(s): VITAMINB12, FOLATE, FERRITIN, TIBC, IRON, RETICCTPCT in the last 72 hours.  Urine analysis:    Component Value Date/Time   COLORURINE YELLOW 12/10/2012 [place] 12/10/2012 1056   LABSPEC 1.016 12/10/2012 1056   PHURINE 5.0 12/10/2012 1056   GLUCOSEU NEGATIVE 12/10/2012 1056   GLUCOSEU NEGATIVE 06/21/2012 0819   HGBUR NEGATIVE 12/10/2012 [place] 12/10/2012 1056   [place] 12/10/2012 1056   PROTEINUR NEGATIVE 12/10/2012 1056   UROBILINOGEN 0.2 12/10/2012 1056   NITRITE NEGATIVE 12/10/2012 1056   LEUKOCYTESUR NEGATIVE 12/10/2012 1056    Sepsis Labs: @LABRCNTIP (procalcitonin:4,lacticidven:4) )No results found for this or any previous visit (from the past 240 hour(s)).   Radiological Exams on Admission: Dg Chest Port 1 View  Result Date: 02/12/2018 CLINICAL DATA:  Shortness of breath and wheezing.  Cough. EXAM: PORTABLE CHEST 1 VIEW COMPARISON:  Mar 28, 2015 FINDINGS: There is no edema or consolidation. The heart size and pulmonary vascularity are normal. No adenopathy. Patient is status post coronary artery bypass grafting. Several sternal wires are fractured, stable. There is aortic atherosclerosis. No adenopathy. Evidence of old trauma involving the lateral left clavicle. IMPRESSION: No edema or consolidation. Stable cardiac silhouette. There is aortic atherosclerosis. Aortic Atherosclerosis (ICD10-I70.0). Electronically Signed   By: Lowella Grip III M.D.   On: 02/12/2018 09:26    EKG: Independently reviewed.  Assessment/Plan Principal Problem:   Severe persistent asthma with acute exacerbation Active Problems:   GOUT   THROMBOCYTOPENIA   Secondary renal hyperparathyroidism (HCC)   Anemia   Coronary artery disease   Hyperlipidemia   Hypertension   Aspirin allergy   Hx of CABG   S/P kidney transplant   H/O steroid therapy   Diabetes (HCC)   GERD (gastroesophageal reflux disease)    Acute respiratory distress due to Asthma exacerbation: Patient receivednebs, Magnesium and Soumedrol 125 mg IV x1 treatment in ED. CXR w/o evidence of acute process.patient was  placed on BiPAP, as she continues to be short of breath despite treatments.Afebrile. WBC normal  Med surg, will observe overnight Duonebs Q4/ Albuterol Q2PRN 1 stable, currently she is on continuous nebulizer Solumedrol 60 mg 3 times daily x24 hrs , may need to consider prednisone  taper upon follow up if wheezing continues No antibiotics at this time Patient follows at [facility] with pulmonary.  Symptoms improved, the patient may restart Brio and Spiriva.Pulmonology consult may be needed if symptoms persist Obtain CT Angio of the chest, in view of non-improvement of her respiratory intolerance despite several treatments  Chronic nonallergic rhinitis and conjunctivitis,  the patient will continue on nasal sprays   History of ESRD, status post dialysis, now status post renal transplant, on chronic tacrolimus and prednisone. Cr stable at 1.21  Currently, prednisone is on hold, she is receiving it IV, may resume in a.m. Continue tacrolimus  Thrombocytopenia, in the presence of chronic tacrolimus, currently at 137,000, no bleeding issues are noted. Continue tacrolimus as prescribed Check CBC in a.m.   Hypertension BP  133/57   Pulse   115  Continue home anti-hypertensive medications    Hyperlipidemia Continue home statins  GERD, no acute symptoms Continue PPI  Type II Diabetes Current blood sugar level is  Lab Results  Component Value Date   HGBA1C 5.5 02/28/2013  Hgb A1C Hold home oral diabetic medications.   SSI   CAD,  EKG NSR ,  Tn 0   last cardiac catheterization 2011 p MI , . Continue ASA,  high dose statin, and Norvasc  DVT prophylaxis:  Lovenox Code Status:    Full  Family Communication:  Discussed with patient Disposition Plan: Expect patient to be discharged to home after condition improves Consults called:    None  Admission status: SDU Obs   Sharene Butters, PA-C [facility]   Amion text  [phone]   02/12/2018, 12:28 PM

## 2018-02-12 NOTE — Progress Notes (Signed)
RT note: RT trial patient off BIPAP per RN's and patients request. Vital signs stable at this time.Placed on 2 L nasal canula>

## 2018-02-12 NOTE — ED Notes (Signed)
Pt is on 2L [place] at this time tolerating it well. BBS to ausculation expiratory wheezing with decrease coarse aeration throughout. Pt is stable at this time.

## 2018-02-12 NOTE — ED Notes (Addendum)
Attempted to call report for inpatient room, informed that staff is currently looking for appropriate monitor for patient

## 2018-02-12 NOTE — ED Triage Notes (Signed)
To ED for eval sob for a few days. Pt with audible wheezing. States she has been using her inhaler every 6 hrs without relief. Unable to speak sentences

## 2018-02-12 NOTE — Progress Notes (Signed)
RT note: RT transported patient to CT and back to ED on BIPAP. Patient had increased WOB moving from bed to CT table and back. Vital signs stable at this time. RT will continue to monitor.

## 2018-02-12 NOTE — ED Provider Notes (Signed)
[facility] EMERGENCY DEPARTMENT Provider Note   CSN: [id number] Arrival date & time: 02/12/18  0086     History   Chief Complaint Chief Complaint  Patient presents with  . Shortness of Breath    HPI [name] is a 77 y.o. female.  HPI Pt presents with severe shortness of breath.  She chronically has some issues with her asthma but she felt no different than usual last night.  WHen she woke up this am she felt severely short of breath.    SHe tried some treatments but felt like she was getting worse.  SHe does have  A dry cough.  No fevers, no swelling, no chest pain.  No prior hospitalization or ICU admission for her breathing issues.  Pt has history of a renal transplant.  No longer on dialysis. Past Medical History:  Diagnosis Date  . 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 Asthma   . Coronary artery disease    cath 11/2007, grafts patent /  Nuclear, June, 2011, prior inferior MI with mild peri-infarct ischemia, anterior breast attenuation, EF 67%, done for renal transplant assessment / Low level exercise/Lexiscan Myoview (11/2013): EF 67%, no ischemi; normal study  . Diabetes mellitus    type 2  . ESRD on dialysis [facility])    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, 2012  . GERD (gastroesophageal reflux disease)   . GI bleed 11/26/2010   January, 2012 , AVM  . Gout   . History of methicillin resistant staphylococcus aureus (MRSA)   . Hyperlipidemia   . Hyperparathyroidism   . Hypertension   . Myocardial infarction ([place])   . Osteoporosis   . Pneumonia 08/2010    Hospitalization, October, 2011  . Primary osteoarthritis, 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  . S/P kidney transplant    September, 2012, [facility]  . Subacromial impingement of 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    Patient Active Problem List   Diagnosis Date Noted  . Complete rotator cuff tear of 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  . Subacromial  impingement of 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  . Primary osteoarthritis, 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  . History of methicillin resistant staphylococcus aureus (MRSA)   . Benign neoplasm of colon 07/14/2014  . Nausea & vomiting 12/22/2013  . Dyspnea 12/22/2013  . Diabetes ([place]) 02/28/2013  . Preop cardiovascular exam   . Type II or unspecified type diabetes mellitus without mention of complication, not stated as uncontrolled 06/05/2012  . Encounter for long-term (current) use of other medications 02/26/2012  . Type II or unspecified type diabetes mellitus with renal manifestations, not stated as uncontrolled(250.40) 02/26/2012  . S/P kidney transplant   . H/O steroid therapy   . Anemia   . Coronary artery disease   . Hyperlipidemia   . Hypertension   . Aspirin allergy   . Hx of CABG   . Ejection fraction   . Anemia   . GI bleed 11/26/2010  . Pneumonia 08/20/2010  . FOOT PAIN 01/20/2010  . THROMBOCYTOPENIA 01/11/2009  . 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 GOUT 08/12/2007  . ASTHMA 08/12/2007  . MENOPAUSAL SYNDROME 08/12/2007  . OSTEOPOROSIS 08/12/2007  . VERTIGO 08/12/2007    Past Surgical History:  Procedure Laterality Date  . ABDOMINAL HYSTERECTOMY  1980'S  . ARTERIOVENOUS GRAFT PLACEMENT Left 03/04/2007   forearm  . BREAST EXCISIONAL BIOPSY    . BREAST SURGERY  YRS AGO   RT BREAST  CYST REMOVED   . CARDIAC CATHETERIZATION  06/03/2002; 12/04/2007  . COLONOSCOPY WITH PROPOFOL N/A 07/14/2014   Procedure: COLONOSCOPY WITH PROPOFOL;  Surgeon: Lear Ng, MD;  Location: [facility] ENDOS[facility];  Service: Endoscopy;  Laterality: N/A;  . CORONARY ARTERY BYPASS GRAFT  06/06/2002   x 4  . HOT HEMOSTASIS N/A 07/14/2014   Procedure: HOT HEMOSTASIS (ARGON PLASMA COAGULATION/BICAP);  Surgeon: Lear Ng, MD;  Location: [facility];  Service: Endoscopy;  Laterality: N/A;  . KIDNEY TRANSPLANT Right 08/04/2011  . ORIF PATELLA Left 04/06/2016   Procedure: OPEN REDUCTION INTERNAL  (ORIF) LEFT  PATELLA;  Surgeon: Ninetta Lights, MD;  Location: [place] [facility] [place];  Service: Orthopedics;  Laterality: Left;  . SHOULDER ARTHROSCOPY WITH ROTATOR CUFF REPAIR AND SUBACROMIAL DECOMPRESSION Left 08/03/2016   Procedure: LEFT SHOULDER ARTHROSCOPY WITH ROTATOR CUFF REPAIR AND SUBACROMIAL DECOMPRESSION with distal claviculectomy and extentsive debridement;  Surgeon: Ninetta Lights, MD;  Location: [place];  Service: Orthopedics;  Laterality: Left;  Block  . THROMBECTOMY AND REVISION OF ARTERIOVENTOUS (AV) GORETEX  GRAFT Left 05/08/2007   forearm  . TOTAL HIP ARTHROPLASTY  12/18/2012   Procedure: TOTAL HIP ARTHROPLASTY;  Surgeon: Ninetta Lights, MD;  Location: [place];  Service: Orthopedics;  Laterality: Left;  Marland Kitchen VESICOVAGINAL FISTULA CLOSURE W/ TAH  1984     OB History   None      Home Medications    Prior to Admission medications   Medication Sig Start Date End Date Taking? Authorizing Provider  albuterol (PROVENTIL) (2.5 MG/3ML) 0.083% nebulizer solution Inhale 2.5 mg into the lungs every 6 (six) hours as needed for wheezing. 06/05/17 06/05/18 Yes [provider]  amLODipine (NORVASC) 10 MG tablet Take 10 mg by mouth every morning.    Yes [provider]  aspirin EC 81 MG tablet Take 81 mg by mouth daily.   Yes [provider]  Azelastine HCl 0.15 % SOLN Place 1 spray into the nose 2 (two) times daily. 03/13/12  Yes Kindl, Nelda Severe, MD  calcitRIOL (ROCALTROL) 0.25 MCG capsule Take 0.25 mcg by mouth daily. Alternate 2 tablets one day, 3 tablets the next day.   Yes [provider]  fluticasone (FLONASE) 50 MCG/ACT nasal spray Place 1 spray into both nostrils 2 (two) times daily.   Yes [provider]  fluticasone furoate-vilanterol (BREO ELLIPTA) 200-25 MCG/INH AEPB Inhale 1 puff into the lungs daily. 10/23/17  Yes [provider]  glipiZIDE (GLUCOTROL) 5 MG tablet Take 5 mg by mouth daily before breakfast.   01/25/12  Yes [provider]  omeprazole (PRILOSEC) 20 MG capsule Take 20 mg by mouth daily.    Yes [provider]  predniSONE (DELTASONE) 5 MG tablet Take 5 mg by mouth daily. Reported on 03/07/2016   Yes [provider]  rosuvastatin (CRESTOR) 10 MG tablet Take 0.5 tablets (5 mg total) by mouth daily. Please make yearly appt with Dr. Acie Fredrickson for April. 1st attempt 01/14/18  Yes Nahser, Wonda Cheng, MD  tacrolimus (PROGRAF) 1 MG capsule Take 5 mg by mouth every 12 (twelve) hours. Pt takes medication at 9 am & 9 pm   Yes [provider]  Tiotropium Bromide Monohydrate (SPIRIVA RESPIMAT) 1.25 MCG/ACT AERS Inhale 1 puff into the lungs daily. 10/23/17  Yes [provider]  nitroGLYCERIN (NITROSTAT) 0.4 MG SL tablet Place 1 tablet (0.4 mg total) under the tongue every 5 (five) minutes as needed for chest pain. 03/07/16   Nahser, Arnette Norris  J, MD  ondansetron (ZOFRAN) 4 MG tablet Take 1 tablet (4 mg total) by mouth every 8 (eight) hours as needed for nausea or vomiting. Patient not taking: Reported on 02/12/2018 04/06/16   Aundra Dubin, PA-C    Family History Family History  Problem Relation Age of Onset  . Breast cancer Sister   . Cancer Neg Hx     Social History Social History   Tobacco Use  . Smoking status: Former Smoker    Packs/day: 1.00    Years: 8.00    Pack years: 8.00    Types: Cigarettes    Last attempt to quit: 11/21/1983    Years since quitting: 34.2  . Smokeless tobacco: Never Used  Substance Use Topics  . Alcohol use: No  . Drug use: No     Allergies   Lactose; Asa arthritis strength-antacid [aspirin buffered]; Aspirin; Atorvastatin; Banana; Lactose intolerance (gi); and Lisinopril   Review of Systems Review of Systems  All other systems reviewed and are negative.    Physical Exam Updated Vital Signs BP (!) 146/57   Pulse (!) 122   Temp 97.9 F (36.6 C) (Oral)   Resp (!) 23   Ht 1.588 m (5' 2.5")   Wt 73.9 kg (163 lb)    SpO2 98%   BMI 29.34 kg/m   Physical Exam  Constitutional: She appears well-developed and well-nourished. No distress.  HENT:  Head: Normocephalic and atraumatic.  Right Ear: External ear normal.  Left Ear: External ear normal.  Eyes: Conjunctivae are normal. Right eye exhibits no discharge. Left eye exhibits no discharge. No scleral icterus.  Neck: Neck supple. No tracheal deviation present.  Cardiovascular: Normal rate, regular rhythm and intact distal pulses.  Pulmonary/Chest: Accessory muscle usage present. No stridor. Tachypnea noted. No respiratory distress. She has wheezes. She has no rales.  Abdominal: Soft. Bowel sounds are normal. She exhibits no distension. There is no tenderness. There is no rebound and no guarding.  Musculoskeletal: She exhibits no edema or tenderness.  Neurological: She is alert. She has normal strength. No cranial nerve deficit (no facial droop, extraocular movements intact, no slurred speech) or sensory deficit. She exhibits normal muscle tone. She displays no seizure activity. Coordination normal.  Skin: Skin is warm and dry. No rash noted.  Psychiatric: She has a normal mood and affect.  Nursing note and vitals reviewed.    ED Treatments / Results  Labs (all labs ordered are listed, but only abnormal results are displayed) Labs Reviewed  BASIC METABOLIC PANEL - Abnormal; Notable for the following components:      Result Value   Creatinine, Ser 1.21 (*)    GFR calc non Af Amer 43 (*)    GFR calc Af Amer 49 (*)    All other components within normal limits  CBC - Abnormal; Notable for the following components:   RBC 5.22 (*)    MCV 76.1 (*)    MCH 25.7 (*)    RDW 17.3 (*)    Platelets 137 (*)    All other components within normal limits  BRAIN NATRIURETIC PEPTIDE  I-STAT TROPONIN, ED    EKG EKG Interpretation  Date/Time:  Tuesday February 12 2018 08:31:45 EDT Ventricular Rate:  98 PR Interval:  144 QRS Duration: 72 QT Interval:  368 QTC  Calculation: 469 R Axis:   54 Text Interpretation:  Normal sinus rhythm Normal ECG No significant change since last tracing Confirmed by Dorie Rank [phone]) on 02/12/2018 8:55:50 AM  Radiology Dg Chest Port 1 View  Result Date: 02/12/2018 CLINICAL DATA:  Shortness of breath and wheezing.  Cough. EXAM: PORTABLE CHEST 1 VIEW COMPARISON:  Mar 28, 2015 FINDINGS: There is no edema or consolidation. The heart size and pulmonary vascularity are normal. No adenopathy. Patient is status post coronary artery bypass grafting. Several sternal wires are fractured, stable. There is aortic atherosclerosis. No adenopathy. Evidence of old trauma involving the lateral left clavicle. IMPRESSION: No edema or consolidation. Stable cardiac silhouette. There is aortic atherosclerosis. Aortic Atherosclerosis (ICD10-I70.0). Electronically Signed   By: Lowella Grip III M.D.   On: 02/12/2018 09:26    Procedures .Critical Care Performed by: Dorie Rank, MD Authorized by: Dorie Rank, MD   Critical care provider statement:    Critical care time (minutes):  40   Critical care was time spent personally by me on the following activities:  Discussions with consultants, evaluation of patient's response to treatment, examination of patient, ordering and performing treatments and interventions, ordering and review of laboratory studies, ordering and review of radiographic studies, pulse oximetry, re-evaluation of patient's condition, obtaining history from patient or surrogate and review of old charts   (including critical care time)  Medications Ordered in ED Medications  amLODipine (NORVASC) tablet 10 mg (has no administration in time range)  glipiZIDE (GLUCOTROL) tablet 5 mg (has no administration in time range)  tacrolimus (PROGRAF) capsule 6 mg (has no administration in time range)  magnesium sulfate IVPB 2 g 50 mL (has no administration in time range)  albuterol (PROVENTIL) (2.5 MG/3ML) 0.083% nebulizer solution (   Given 02/12/18 0840)  methylPREDNISolone sodium succinate (SOLU-MEDROL) 125 mg/2 mL injection 125 mg (125 mg Intravenous Given 02/12/18 0914)  albuterol (PROVENTIL,VENTOLIN) solution continuous neb (10 mg/hr Nebulization Given 02/12/18 0926)     Initial Impression / Assessment and Plan / ED Course  I have reviewed the triage vital signs and the nursing notes.  Pertinent labs & imaging results that were available during my care of the patient were reviewed by me and considered in my medical decision making (see chart for details).  Clinical Course as of Feb 12 1049  Tue Feb 12, 2018  0909 Home BP and antirejection meds ordered   [JK]  1041 Still wheezing on exam with labored breathing.  WIll start bipap and try magnesium.   Plan on admission    [JK]    Clinical Course User Index [JK] Dorie Rank, MD    Patient presented to the emergency room with complaints of wheezing and shortness of breath.  Patient's laboratory tests are unremarkable.  Chest x-ray does not show pneumonia or pulmonary edema.  Her symptoms are consistent with an asthma exacerbation.  Patient has been treated with breathing treatments as well as steroids.  She continues to wheeze significantly and is not showing much improvement.  She remains alert and does not appear to be tiring.  I will order BiPAP to see if we can help her with her work of breathing.  I have also ordered a dose of magnesium.  I will consult the medical service for admission, most likely stepdown unit.  Patient will need close monitoring and further treatment.  Final Clinical Impressions(s) / ED Diagnoses   Final diagnoses:  Severe persistent asthma with exacerbation      Dorie Rank, MD 02/12/18 1050

## 2018-02-13 ENCOUNTER — Encounter (HOSPITAL_COMMUNITY): Payer: Self-pay

## 2018-02-13 DIAGNOSIS — J9601 Acute respiratory failure with hypoxia: Secondary | ICD-10-CM

## 2018-02-13 DIAGNOSIS — Z96642 Presence of left artificial hip joint: Secondary | ICD-10-CM | POA: Diagnosis present

## 2018-02-13 DIAGNOSIS — I2510 Atherosclerotic heart disease of native coronary artery without angina pectoris: Secondary | ICD-10-CM | POA: Diagnosis not present

## 2018-02-13 DIAGNOSIS — J4521 Mild intermittent asthma with (acute) exacerbation: Secondary | ICD-10-CM | POA: Diagnosis not present

## 2018-02-13 DIAGNOSIS — G43909 Migraine, unspecified, not intractable, without status migrainosus: Secondary | ICD-10-CM | POA: Diagnosis present

## 2018-02-13 DIAGNOSIS — J9809 Other diseases of bronchus, not elsewhere classified: Secondary | ICD-10-CM | POA: Diagnosis present

## 2018-02-13 DIAGNOSIS — M810 Age-related osteoporosis without current pathological fracture: Secondary | ICD-10-CM | POA: Diagnosis present

## 2018-02-13 DIAGNOSIS — Z888 Allergy status to other drugs, medicaments and biological substances status: Secondary | ICD-10-CM | POA: Diagnosis not present

## 2018-02-13 DIAGNOSIS — Z886 Allergy status to analgesic agent status: Secondary | ICD-10-CM | POA: Diagnosis not present

## 2018-02-13 DIAGNOSIS — N186 End stage renal disease: Secondary | ICD-10-CM | POA: Diagnosis not present

## 2018-02-13 DIAGNOSIS — E118 Type 2 diabetes mellitus with unspecified complications: Secondary | ICD-10-CM

## 2018-02-13 DIAGNOSIS — Z92241 Personal history of systemic steroid therapy: Secondary | ICD-10-CM | POA: Diagnosis not present

## 2018-02-13 DIAGNOSIS — Z940 Kidney transplant status: Secondary | ICD-10-CM

## 2018-02-13 DIAGNOSIS — E785 Hyperlipidemia, unspecified: Secondary | ICD-10-CM | POA: Diagnosis present

## 2018-02-13 DIAGNOSIS — J4551 Severe persistent asthma with (acute) exacerbation: Secondary | ICD-10-CM | POA: Diagnosis not present

## 2018-02-13 DIAGNOSIS — E7849 Other hyperlipidemia: Secondary | ICD-10-CM | POA: Diagnosis not present

## 2018-02-13 DIAGNOSIS — J310 Chronic rhinitis: Secondary | ICD-10-CM | POA: Diagnosis present

## 2018-02-13 DIAGNOSIS — K219 Gastro-esophageal reflux disease without esophagitis: Secondary | ICD-10-CM | POA: Diagnosis not present

## 2018-02-13 DIAGNOSIS — I10 Essential (primary) hypertension: Secondary | ICD-10-CM

## 2018-02-13 DIAGNOSIS — D696 Thrombocytopenia, unspecified: Secondary | ICD-10-CM

## 2018-02-13 DIAGNOSIS — I252 Old myocardial infarction: Secondary | ICD-10-CM | POA: Diagnosis not present

## 2018-02-13 DIAGNOSIS — N183 Chronic kidney disease, stage 3 (moderate): Secondary | ICD-10-CM | POA: Diagnosis not present

## 2018-02-13 DIAGNOSIS — E7800 Pure hypercholesterolemia, unspecified: Secondary | ICD-10-CM | POA: Diagnosis not present

## 2018-02-13 DIAGNOSIS — I509 Heart failure, unspecified: Secondary | ICD-10-CM | POA: Diagnosis not present

## 2018-02-13 DIAGNOSIS — E1122 Type 2 diabetes mellitus with diabetic chronic kidney disease: Secondary | ICD-10-CM | POA: Diagnosis not present

## 2018-02-13 DIAGNOSIS — Z7951 Long term (current) use of inhaled steroids: Secondary | ICD-10-CM | POA: Diagnosis not present

## 2018-02-13 DIAGNOSIS — D631 Anemia in chronic kidney disease: Secondary | ICD-10-CM | POA: Diagnosis present

## 2018-02-13 DIAGNOSIS — Z951 Presence of aortocoronary bypass graft: Secondary | ICD-10-CM | POA: Diagnosis not present

## 2018-02-13 DIAGNOSIS — E875 Hyperkalemia: Secondary | ICD-10-CM | POA: Diagnosis not present

## 2018-02-13 DIAGNOSIS — R911 Solitary pulmonary nodule: Secondary | ICD-10-CM | POA: Diagnosis present

## 2018-02-13 DIAGNOSIS — M109 Gout, unspecified: Secondary | ICD-10-CM | POA: Diagnosis present

## 2018-02-13 DIAGNOSIS — N2581 Secondary hyperparathyroidism of renal origin: Secondary | ICD-10-CM | POA: Diagnosis not present

## 2018-02-13 DIAGNOSIS — Z794 Long term (current) use of insulin: Secondary | ICD-10-CM | POA: Diagnosis not present

## 2018-02-13 DIAGNOSIS — Z7952 Long term (current) use of systemic steroids: Secondary | ICD-10-CM | POA: Diagnosis not present

## 2018-02-13 DIAGNOSIS — N179 Acute kidney failure, unspecified: Secondary | ICD-10-CM | POA: Diagnosis present

## 2018-02-13 LAB — CBC
HEMATOCRIT: 35.4 % — AB (ref 36.0–46.0)
Hemoglobin: 12.1 g/dL (ref 12.0–15.0)
MCH: 25.1 pg — AB (ref 26.0–34.0)
MCHC: 34.2 g/dL (ref 30.0–36.0)
MCV: 73.3 fL — AB (ref 78.0–100.0)
Platelets: 136 10*3/uL — ABNORMAL LOW (ref 150–400)
RBC: 4.83 MIL/uL (ref 3.87–5.11)
RDW: 16.9 % — ABNORMAL HIGH (ref 11.5–15.5)
WBC: 9.9 10*3/uL (ref 4.0–10.5)

## 2018-02-13 LAB — BASIC METABOLIC PANEL
ANION GAP: 16 — AB (ref 5–15)
BUN: 26 mg/dL — ABNORMAL HIGH (ref 6–20)
CHLORIDE: 102 mmol/L (ref 101–111)
CO2: 22 mmol/L (ref 22–32)
Calcium: 9.3 mg/dL (ref 8.9–10.3)
Creatinine, Ser: 1.67 mg/dL — ABNORMAL HIGH (ref 0.44–1.00)
GFR calc non Af Amer: 29 mL/min — ABNORMAL LOW (ref >60)
GFR, EST AFRICAN AMERICAN: 33 mL/min — AB (ref >60)
Glucose, Bld: 191 mg/dL — ABNORMAL HIGH (ref 65–99)
Potassium: 3.6 mmol/L (ref 3.5–5.1)
SODIUM: 140 mmol/L (ref 135–145)

## 2018-02-13 LAB — GLUCOSE, CAPILLARY
GLUCOSE-CAPILLARY: 247 mg/dL — AB (ref 65–99)
GLUCOSE-CAPILLARY: 207 mg/dL — AB (ref 65–99)
GLUCOSE-CAPILLARY: 186 mg/dL — AB (ref 65–99)
GLUCOSE-CAPILLARY: 289 mg/dL — AB (ref 65–99)

## 2018-02-13 LAB — HEMOGLOBIN A1C
Hgb A1c MFr Bld: 5.7 % — ABNORMAL HIGH (ref 4.8–5.6)
Mean Plasma Glucose: 116.89 mg/dL

## 2018-02-13 LAB — TROPONIN I
TROPONIN I: 0.09 ng/mL — AB (ref <0.03)
Troponin I: 0.12 ng/mL (ref <0.03)
Troponin I: 0.14 ng/mL (ref <0.03)

## 2018-02-13 MED ORDER — ENOXAPARIN SODIUM 30 MG/0.3ML ~~LOC~~ SOLN
30.0000 mg | SUBCUTANEOUS | Status: DC
  Administered 2018-02-13 – 2018-02-20 (×8): 30 mg via SUBCUTANEOUS
  Filled 2018-02-13 (×8): qty 0.3

## 2018-02-13 MED ORDER — METHYLPREDNISOLONE SODIUM SUCC 125 MG IJ SOLR
80.0000 mg | Freq: Three times a day (TID) | INTRAMUSCULAR | Status: DC
  Administered 2018-02-13 – 2018-02-18 (×15): 80 mg via INTRAVENOUS
  Filled 2018-02-13 (×15): qty 2

## 2018-02-13 MED ORDER — INSULIN GLARGINE 100 UNIT/ML ~~LOC~~ SOLN
5.0000 [IU] | Freq: Every day | SUBCUTANEOUS | Status: DC
  Administered 2018-02-13 – 2018-02-14 (×2): 5 [IU] via SUBCUTANEOUS
  Filled 2018-02-13 (×2): qty 0.05

## 2018-02-13 MED ORDER — ORAL CARE MOUTH RINSE
15.0000 mL | Freq: Two times a day (BID) | OROMUCOSAL | Status: DC
  Administered 2018-02-13 – 2018-02-20 (×14): 15 mL via OROMUCOSAL

## 2018-02-13 MED ORDER — IPRATROPIUM-ALBUTEROL 0.5-2.5 (3) MG/3ML IN SOLN
3.0000 mL | Freq: Three times a day (TID) | RESPIRATORY_TRACT | Status: DC
  Administered 2018-02-13 – 2018-02-21 (×24): 3 mL via RESPIRATORY_TRACT
  Filled 2018-02-13 (×23): qty 3

## 2018-02-13 MED ORDER — HYDRALAZINE HCL 20 MG/ML IJ SOLN
5.0000 mg | Freq: Four times a day (QID) | INTRAMUSCULAR | Status: DC
  Administered 2018-02-13 – 2018-02-20 (×28): 5 mg via INTRAVENOUS
  Filled 2018-02-13 (×29): qty 1

## 2018-02-13 MED ORDER — AZATHIOPRINE 50 MG PO TABS
50.0000 mg | ORAL_TABLET | Freq: Every day | ORAL | Status: DC
  Administered 2018-02-13 – 2018-02-21 (×9): 50 mg via ORAL
  Filled 2018-02-13 (×9): qty 1

## 2018-02-13 MED ORDER — TACROLIMUS 1 MG PO CAPS
5.0000 mg | ORAL_CAPSULE | Freq: Two times a day (BID) | ORAL | Status: DC
  Administered 2018-02-13 – 2018-02-21 (×16): 5 mg via ORAL
  Filled 2018-02-13 (×19): qty 5

## 2018-02-13 MED ORDER — ALBUTEROL SULFATE (2.5 MG/3ML) 0.083% IN NEBU: 2.5000 mg | INHALATION_SOLUTION | RESPIRATORY_TRACT | Status: DC | PRN

## 2018-02-13 NOTE — Progress Notes (Signed)
Shared AD materials with patient.  She was not ready to complete forms but knows to call chaplain if she decides to fill these out. Conard Novak, Chaplain   02/13/18 1300  Clinical Encounter Type  Visited With Patient  Visit Type Initial;Other (Comment)  Referral From Physician (requested AD materials)  Consult/Referral To Chaplain

## 2018-02-13 NOTE — Progress Notes (Signed)
Inpatient Diabetes Program Recommendations  AACE/ADA: New Consensus Statement on Inpatient Glycemic Control (2015)  Target Ranges:  Prepandial:   less than 140 mg/dL      Peak postprandial:   less than 180 mg/dL (1-2 hours)      Critically ill patients:  140 - 180 mg/dL   Lab Results  Component Value Date   GLUCAP 247 (H) 02/13/2018   HGBA1C 5.7 (H) 02/13/2018    Review of Glycemic Control Results for [name], [name] (MRN [id number]) as of 02/13/2018 12:21  Ref. Range 02/12/2018 12:39 02/12/2018 17:38 02/13/2018 08:04  Glucose-Capillary Latest Ref Range: 65 - 99 mg/dL 242 (H) 352 (H) 247 (H)   Diabetes history: DM2 Outpatient Diabetes medications: Glucotrol 5 mg qd Current orders for Inpatient glycemic control: Novolog moderate correction tid   Inpatient Diabetes Program Recommendations:   Noted patient on steroids and CBGs elevated 242-352. While on steroids and oral medication held: -Lantus 10 units qd (0.15 unit/kg x 72.4 kg = 10 units) -Increase Novolog correction to q 4 hrs while NPO.  Thank you, Nani Gasser. Mekel Haverstock, RN, MSN, CDE  Diabetes Coordinator Inpatient Glycemic Control Team Team Pager [phone] (8am-5pm) 02/13/2018 12:25 PM

## 2018-02-13 NOTE — Progress Notes (Signed)
Pt c/o 9/10 CP at 0155, nitroglycerin PRN was administered and an EKG was obtained. The on call NP Schorr was notified. No new orders at this time.

## 2018-02-13 NOTE — Progress Notes (Signed)
The pt continues to c/o intermittent severe CP after 3 doses of nitroglycerin and two doses of morphine per PRN orders. On call NP Schorr notified, no new orders at this time. Will continue to monitor and will pass information along to oncoming shift.

## 2018-02-13 NOTE — Progress Notes (Signed)
PROGRESS NOTE    [name]  YIR:[id number] DOB: 07-19-1941 DOA: 02/12/2018 PCP: Renato Shin, MD   Brief Narrative:  77 y.o. BF PMHx Chronic Asthma, CAD S/P cardiac catheterization in January 2009, S/P  MI in 2011, Diabetes Type 2 , ESRD S/P HD now S/P Renal Transplant since September 2012, on tacrolimus and chronic steroid followed at [facility], GERD, Gout, HTN, HLD, Hyperparathyroidism,   Presenting today with severe shortness of breath.  Patient has chronic issues with her asthma, usually able to treated with nebulizers at home, however she failed those treatments, feeling that this was getting worse.  She also reported dry cough.  She denies any fever or chills. Denies sick contacts, She denies any swelling in the lower extremities.  She reports substernal in the lower portion of her chest some tightness with deep inspiration, she denies any palpitations.  The patient denies any prior hospitalization or ICU admission regarding her breathing issues.     ED Course:  BP (!) 133/57   Pulse (!) 115   Temp 97.9 F (36.6 C) (Oral)   Resp 19   Ht 5' 2.5" (1.588 m)   Wt 73.9 kg (163 lb)   SpO2 99%   BMI 29.34 kg/m   At the ED, the patient received 3 breathing treatments, with continuous nebulizer, and Solu-Medrol 125 mg x1, however she did not show much improvement. She was placed on BiPAP, with some respiratory comfort. He also received a dose of magnesium. Glucose 95 Creatinine 1.21, stable GFR 49 BNP 91.5 Troponin 0 White count 8.3, hemoglobin 13.4, platelets 137 (likely related to tacrolimus) Chest x-ray showed edema or consolidation    Subjective: 3/27 A/O 4, positive SOB, negative CP, negative abdominal pain. States this is her first asthma exacerbation. Not on home O2, does not use CPAP States the reason her pulmonologist is at [facility] is because her asthma was discovered when she was admitted at [facility] for her renal transplant. States she also sees Dr. Jimmy Footman nephrology here at  home as well as following at [facility] nephrology.   Assessment & Plan:   Principal Problem:   Severe persistent asthma with acute exacerbation Active Problems:   GOUT   THROMBOCYTOPENIA   Secondary renal hyperparathyroidism (HCC)   Anemia   Coronary artery disease   Hyperlipidemia   Hypertension   Aspirin allergy   Hx of CABG   S/P kidney transplant   H/O steroid therapy   Diabetes (HCC)   GERD (gastroesophageal reflux disease)  Acute respiratory failure with hypoxia/asthma exacerbation Patient receivednebs, Magnesium and Soumedrol 125 mg IV x1 treatment in ED. CXR w/o evidence of acute process.patient was placed on BiPAP, as she continues to be short of breath despite treatments.Afebrile. WBC normal  -Solu-Medrol 80 mg TID -DuoNeb TID -Hold antibiotics at this time. -CT angiogram negative for PE -Echocardiogram pending   Chronic nonallergic rhinitis and conjunctivitis,  -The patient will continue on nasal sprays     History of ESRD S/P Renal transplant,  (baseline Cr 1.21) -Previous HD patient  - Azathioprine 50 mg daily -Solu-Medrol (for respiratory distress instead of her home dose prednisone) - Tacrolimus 5 mg BID Recent Labs  Lab 02/12/18 0918 02/13/18 0305  CREATININE 1.21* 1.67*  -Slightly above baseline -Hold all nephrotoxic agents   Thrombocytopenia,  -Mild thrombocytopenia and presence chronic tacrolimus: currently at 137,000 -No signs of overt bleeding - Monitor closely,   Essential Hypertension   -Amlodipine 10 mg daily -Hydralazine IV 5 mg QID  Hyperlipidemia -Crestor 5  mg daily -Lipid panel pending    GERD, no acute symptoms Continue PPI   Type II Diabetes controlled with complication   -7/62 Hemoglobin A1c = 5.7  -3/27 Lantus 5 mg daily: Secondary to high-dose steroids -Moderate SSI   CAD,  EKG NSR ,  Tn 0   last cardiac catheterization 2011 p MI , . Continue ASA,  high dose statin, and Norvasc    DVT prophylaxis: Lovenox Code  Status: Full Family Communication: None Disposition Plan: TBD   Consultants:  None  Procedures/Significant Events:  3/26 CT angiogram chest PE protocol:No evidence of pulmonary embolism. 2. Solitary new solid 4 mm right middle lobe pulmonary nodule. No follow-up needed if patient is low-risk. Non-contrast chest CT can be considered in 12 months if patient is high-risk. This recommendation follows the consensus statement: Guidelines for Management of Incidental Pulmonary Nodules Detected on CT Images:From the Fleischner Society 2017; published online before print (10.1148/[id number]). 3. 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 4. Evidence of bronchomalacia, particularly in the central right airways. 5. Dilated main pulmonary artery, suggesting pulmonary arterial hypertension.    I have personally reviewed and interpreted all radiology studies and my findings are as above.  VENTILATOR SETTINGS:    Cultures   Antimicrobials: Anti-infectives (From admission, onward)   None       Devices    LINES / TUBES:      Continuous Infusions:   Objective: Vitals:   02/13/18 0348 02/13/18 0500 02/13/18 0715 02/13/18 0805  BP: (!) 147/65   (!) 131/54  Pulse: (!) 112     Resp: 20     Temp: 97.9 F (36.6 C)   98.2 F (36.8 C)  TempSrc: Oral   Oral  SpO2: 95%  98%   Weight:  159 lb 9.8 oz (72.4 kg)    Height:        Intake/Output Summary (Last 24 hours) at 02/13/2018 0831 Last data filed at 02/13/2018 0815 Gross per 24 hour  Intake 50 ml  Output 650 ml  Net -600 ml   Filed Weights   02/12/18 0832 02/12/18 2026 02/13/18 0500  Weight: 163 lb (73.9 kg) 159 lb 2.8 oz (72.2 kg) 159 lb 9.8 oz (72.4 kg)    Examination:  General: A/O 4, positive acute respiratory distress Neck:  Negative scars, masses, torticollis, lymphadenopathy, JVD Lungs: poor air movement diffusely, diffuse expiratory wheeze,  negative crackles  Cardiovascular: Tachycardic, Regular rhythm without murmur gallop or rub normal S1 and S2 Abdomen: negative abdominal pain, nondistended, positive soft, bowel sounds, no rebound, no ascites, no appreciable mass Extremities: No significant cyanosis, clubbing, or edema bilateral lower extremities Skin: Negative rashes, lesions, ulcers Psychiatric:  Negative depression, negative anxiety, negative fatigue, negative mania  Central nervous system:  Cranial nerves II through XII intact, tongue/uvula midline, all extremities muscle strength 5/5, sensation intact throughout,  negative dysarthria, negative expressive aphasia, negative receptive aphasia.  .     Data Reviewed: Care during the described time interval was provided by me .  I have reviewed this patient's available data, including medical history, events of note, physical examination, and all test results as part of my evaluation.   CBC: Recent Labs  Lab 02/12/18 0918 02/13/18 0305  WBC 8.3 9.9  HGB 13.4 12.1  HCT 39.7 35.4*  MCV 76.1* 73.3*  PLT 137* 737*   Basic Metabolic Panel: Recent Labs  Lab 02/12/18 0918 02/13/18 0305  NA 141 140  K 3.6 3.6  CL 104 102  CO2 27 22  GLUCOSE 95 191*  BUN 13 26*  CREATININE 1.21* 1.67*  CALCIUM 9.3 9.3   GFR: Estimated Creatinine Clearance: 27.1 mL/min (A) (by C-G formula based on SCr of 1.67 mg/dL (H)). Liver Function Tests: No results for input(s): AST, ALT, ALKPHOS, BILITOT, PROT, ALBUMIN in the last 168 hours. No results for input(s): LIPASE, AMYLASE in the last 168 hours. No results for input(s): AMMONIA in the last 168 hours. Coagulation Profile: No results for input(s): INR, PROTIME in the last 168 hours. Cardiac Enzymes: No results for input(s): CKTOTAL, CKMB, CKMBINDEX, TROPONINI in the last 168 hours. BNP (last 3 results) No results for input(s): PROBNP in the last 8760 hours. HbA1C: Recent Labs    02/13/18 0305  HGBA1C 5.7*   CBG: Recent Labs   Lab 02/12/18 1239 02/12/18 1738 02/13/18 0804  GLUCAP 242* 352* 247*   Lipid Profile: No results for input(s): CHOL, HDL, LDLCALC, TRIG, CHOLHDL, LDLDIRECT in the last 72 hours. Thyroid Function Tests: No results for input(s): TSH, T4TOTAL, FREET4, T3FREE, THYROIDAB in the last 72 hours. Anemia Panel: No results for input(s): VITAMINB12, FOLATE, FERRITIN, TIBC, IRON, RETICCTPCT in the last 72 hours. Urine analysis:    Component Value Date/Time   COLORURINE YELLOW 02/12/2018 2129   APPEARANCEUR CLEAR 02/12/2018 2129   LABSPEC >1.046 (H) 02/12/2018 2129   PHURINE 5.0 02/12/2018 2129   GLUCOSEU NEGATIVE 02/12/2018 2129   GLUCOSEU NEGATIVE 06/21/2012 0819   HGBUR NEGATIVE 02/12/2018 2129   [place] NEGATIVE 02/12/2018 2129   KETONESUR 5 (A) 02/12/2018 2129   PROTEINUR 100 (A) 02/12/2018 2129   UROBILINOGEN 0.2 12/10/2012 1056   NITRITE NEGATIVE 02/12/2018 2129   LEUKOCYTESUR NEGATIVE 02/12/2018 2129   Sepsis Labs: @LABRCNTIP (procalcitonin:4,lacticidven:4)  ) Recent Results (from the past 240 hour(s))  MRSA PCR Screening     Status: None   Collection Time: 02/12/18  8:29 PM  Result Value Ref Range Status   MRSA by PCR NEGATIVE NEGATIVE Final    Comment:        The GeneXpert MRSA Assay (FDA approved for NASAL specimens only), is one component of a comprehensive MRSA colonization surveillance program. It is not intended to diagnose MRSA infection nor to guide or monitor treatment for MRSA infections. Performed at [facility], [place] [address], [place], [place] [address]          Radiology Studies: Ct Angio Chest Pe W Or Wo Contrast  Result Date: 02/12/2018 CLINICAL DATA:  Dyspnea for several days with wheezing. EXAM: CT ANGIOGRAPHY CHEST WITH CONTRAST TECHNIQUE: Multidetector CT imaging of the chest was performed using the standard protocol during bolus administration of intravenous contrast. Multiplanar CT image reconstructions and MIPs were obtained to  evaluate the vascular anatomy. CONTRAST:  135mL ISOVUE-370 IOPAMIDOL (ISOVUE-370) INJECTION 76% COMPARISON:  02/12/2018 chest radiograph. 10/16/2011 chest CT angiogram. FINDINGS: Cardiovascular: The study is moderate quality for the evaluation of pulmonary embolism, with evaluation of the segmental and subsegmental arteries limited by motion artifact. There are no convincing filling defects in the central, lobar, segmental or subsegmental pulmonary artery branches to suggest acute pulmonary embolism. Atherosclerotic nonaneurysmal thoracic aorta. Dilated main pulmonary artery (3.5 cm diameter), mildly increased from 3.3 cm. Normal heart size. No significant pericardial fluid/thickening. Left main and 3 vessel coronary atherosclerosis status post CABG. Mediastinum/Nodes: No discrete thyroid nodules. Unremarkable esophagus. No pathologically enlarged axillary, mediastinal or hilar lymph nodes. Lungs/Pleura: No pneumothorax. No pleural effusion. Moderate to severe centrilobular emphysema with diffuse bronchial wall  thickening. Evidence of bronchomalacia, particularly in the central right airways. Solid 4 mm right middle lobe pulmonary nodule (series 6/image 109), new. No acute consolidative airspace disease, lung masses or additional significant pulmonary nodules. Upper abdomen: No acute abnormality. Musculoskeletal: No aggressive appearing focal osseous lesions. Discontinuities are noted in the 2 upper most sternotomy wires, unchanged. Mild thoracic spondylosis. Review of the MIP images confirms the above findings. IMPRESSION: 1. No evidence of pulmonary embolism. 2. Solitary new solid 4 mm right middle lobe pulmonary nodule. No follow-up needed if patient is low-risk. Non-contrast chest CT can be considered in 12 months if patient is high-risk. This recommendation follows the consensus statement: Guidelines for Management of Incidental Pulmonary Nodules Detected on CT Images:From the Fleischner Society 2017; published  online before print (10.1148/[id number]). 3. 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 4. Evidence of bronchomalacia, particularly in the central right airways. 5. Dilated main pulmonary artery, suggesting pulmonary arterial hypertension. Aortic Atherosclerosis (ICD10-I70.0) and Emphysema (ICD10-J43.9). Electronically Signed   By: Ilona Sorrel M.D.   On: 02/12/2018 12:44   Dg Chest Port 1 View  Result Date: 02/12/2018 CLINICAL DATA:  Shortness of breath and wheezing.  Cough. EXAM: PORTABLE CHEST 1 VIEW COMPARISON:  Mar 28, 2015 FINDINGS: There is no edema or consolidation. The heart size and pulmonary vascularity are normal. No adenopathy. Patient is status post coronary artery bypass grafting. Several sternal wires are fractured, stable. There is aortic atherosclerosis. No adenopathy. Evidence of old trauma involving the lateral left clavicle. IMPRESSION: No edema or consolidation. Stable cardiac silhouette. There is aortic atherosclerosis. Aortic Atherosclerosis (ICD10-I70.0). Electronically Signed   By: Lowella Grip III M.D.   On: 02/12/2018 09:26        Scheduled Meds: . amLODipine  10 mg Oral q morning - 10a  . aspirin  81 mg Oral Daily  . azelastine  1 spray Each Nare BID  . calcitRIOL  0.25 mcg Oral Daily  . enoxaparin (LOVENOX) injection  40 mg Subcutaneous Q24H  . fluticasone  1 spray Each Nare BID  . insulin aspart  0-15 Units Subcutaneous TID WC  . ipratropium-albuterol  3 mL Nebulization Q4H  . mouth rinse  15 mL Mouth Rinse BID  . methylPREDNISolone (SOLU-MEDROL) injection  125 mg Intravenous Q8H  . pantoprazole  40 mg Oral Daily  . rosuvastatin  5 mg Oral QHS  . tacrolimus  6 mg Oral Q12H   Continuous Infusions:   LOS: 0 days    Time spent: 40 minutes    Melany Wiesman, Geraldo Docker, MD [facility] Pager [phone]   If 7PM-7AM, please contact night-coverage [email or web address] Password [facility] 02/13/2018,  8:31 AM

## 2018-02-14 ENCOUNTER — Inpatient Hospital Stay (HOSPITAL_COMMUNITY): Payer: Medicare Other

## 2018-02-14 DIAGNOSIS — R911 Solitary pulmonary nodule: Secondary | ICD-10-CM

## 2018-02-14 DIAGNOSIS — Z92241 Personal history of systemic steroid therapy: Secondary | ICD-10-CM

## 2018-02-14 DIAGNOSIS — E7849 Other hyperlipidemia: Secondary | ICD-10-CM

## 2018-02-14 LAB — BASIC METABOLIC PANEL
Anion gap: 16 — ABNORMAL HIGH (ref 5–15)
BUN: 46 mg/dL — ABNORMAL HIGH (ref 6–20)
CALCIUM: 9.5 mg/dL (ref 8.9–10.3)
CO2: 20 mmol/L — AB (ref 22–32)
CREATININE: 2.17 mg/dL — AB (ref 0.44–1.00)
Chloride: 100 mmol/L — ABNORMAL LOW (ref 101–111)
GFR calc non Af Amer: 21 mL/min — ABNORMAL LOW (ref >60)
GFR, EST AFRICAN AMERICAN: 24 mL/min — AB (ref >60)
GLUCOSE: 256 mg/dL — AB (ref 65–99)
Potassium: 4.6 mmol/L (ref 3.5–5.1)
Sodium: 136 mmol/L (ref 135–145)

## 2018-02-14 LAB — LIPID PANEL
CHOL/HDL RATIO: 2.6 ratio
CHOLESTEROL: 182 mg/dL (ref 0–200)
HDL: 69 mg/dL (ref >40)
LDL Cholesterol: 93 mg/dL (ref 0–99)
TRIGLYCERIDES: 102 mg/dL (ref <150)
VLDL: 20 mg/dL (ref 0–40)

## 2018-02-14 LAB — CBC
HCT: 38.6 % (ref 36.0–46.0)
Hemoglobin: 13.7 g/dL (ref 12.0–15.0)
MCH: 26.4 pg (ref 26.0–34.0)
MCHC: 35.5 g/dL (ref 30.0–36.0)
MCV: 74.5 fL — ABNORMAL LOW (ref 78.0–100.0)
PLATELETS: 145 10*3/uL — AB (ref 150–400)
RBC: 5.18 MIL/uL — AB (ref 3.87–5.11)
RDW: 17.6 % — ABNORMAL HIGH (ref 11.5–15.5)
WBC: 21.7 10*3/uL — ABNORMAL HIGH (ref 4.0–10.5)

## 2018-02-14 LAB — GLUCOSE, CAPILLARY
GLUCOSE-CAPILLARY: 249 mg/dL — AB (ref 65–99)
Glucose-Capillary: 243 mg/dL — ABNORMAL HIGH (ref 65–99)
Glucose-Capillary: 137 mg/dL — ABNORMAL HIGH (ref 65–99)
Glucose-Capillary: 185 mg/dL — ABNORMAL HIGH (ref 65–99)
Glucose-Capillary: 186 mg/dL — ABNORMAL HIGH (ref 65–99)

## 2018-02-14 LAB — MAGNESIUM: MAGNESIUM: 2.9 mg/dL — AB (ref 1.7–2.4)

## 2018-02-14 MED ORDER — ROSUVASTATIN CALCIUM 10 MG PO TABS
10.0000 mg | ORAL_TABLET | Freq: Every day | ORAL | Status: DC
  Administered 2018-02-14 – 2018-02-20 (×7): 10 mg via ORAL
  Filled 2018-02-14 (×7): qty 1

## 2018-02-14 MED ORDER — INSULIN GLARGINE 100 UNIT/ML ~~LOC~~ SOLN
8.0000 [IU] | Freq: Every day | SUBCUTANEOUS | Status: DC
  Administered 2018-02-15 – 2018-02-16 (×2): 8 [IU] via SUBCUTANEOUS
  Filled 2018-02-14 (×2): qty 0.08

## 2018-02-14 NOTE — Progress Notes (Signed)
Pt having marked increase in PVCs and ventricular bigeminy. On call NP Schorr notified, no new orders at this time. Will continue to monitor.

## 2018-02-14 NOTE — Progress Notes (Addendum)
Pt in & out of vent trigeminy all AM. Mag elevated at 2.9.Pt just relayed that she has been exposed to confirmed case of bacterial meningitis. Per pt, her sister is currently hospitalized with this diagnosis. Messaged Dr. Sherral Hammers to relay all

## 2018-02-14 NOTE — Progress Notes (Signed)
Received name and number from pt for pts sister. Left vm for pts sister instructing her to fill out consent for release of information at current hospital so that they may fax [place] information regarding her diagnosis. Provided my call back number and unit fax number.

## 2018-02-14 NOTE — Progress Notes (Signed)
PROGRESS NOTE    [name]  YQM:[id number] DOB: 1941-01-06 DOA: 02/12/2018 PCP: Renato Shin, MD   Brief Narrative:  77 y.o. BF PMHx Chronic Asthma, CAD S/P cardiac catheterization in January 2009, S/P  MI in 2011, Diabetes Type 2 , ESRD S/P HD now S/P Renal Transplant since September 2012:, on tacrolimus and chronic steroid followed at [facility], GERD, Gout, HTN, HLD, Hyperparathyroidism,   Presenting today with severe shortness of breath.  Patient has chronic issues with her asthma, usually able to treated with nebulizers at home, however she failed those treatments, feeling that this was getting worse.  She also reported dry cough.  She denies any fever or chills. Denies sick contacts, She denies any swelling in the lower extremities.  She reports substernal in the lower portion of her chest some tightness with deep inspiration, she denies any palpitations.  The patient denies any prior hospitalization or ICU admission regarding her breathing issues.     ED Course:  BP (!) 133/57   Pulse (!) 115   Temp 97.9 F (36.6 C) (Oral)   Resp 19   Ht 5' 2.5" (1.588 m)   Wt 73.9 kg (163 lb)   SpO2 99%   BMI 29.34 kg/m   At the ED, the patient received 3 breathing treatments, with continuous nebulizer, and Solu-Medrol 125 mg x1, however she did not show much improvement. She was placed on BiPAP, with some respiratory comfort. He also received a dose of magnesium. Glucose 95 Creatinine 1.21, stable GFR 49 BNP 91.5 Troponin 0 White count 8.3, hemoglobin 13.4, platelets 137 (likely related to tacrolimus) Chest x-ray showed edema or consolidation    Subjective: 3/28  A/O x4, positive S OB (improved), negative CP, negative abdominal pain.sister called her today is hospitalized at [facility] with bacterial meningitis   Assessment & Plan:   Principal Problem:   Severe persistent asthma with acute exacerbation Active Problems:   GOUT   THROMBOCYTOPENIA   Secondary renal hyperparathyroidism  (HCC)   Anemia   Coronary artery disease   Hyperlipidemia   Hypertension   Aspirin allergy   Hx of CABG   S/P kidney transplant   H/O steroid therapy   Diabetes (HCC)   GERD (gastroesophageal reflux disease)   Severe persistent asthma with (acute) exacerbation  Acute respiratory failure with hypoxia/asthma exacerbation Patient receivednebs, Magnesium and Soumedrol 125 mg IV x1 treatment in ED. CXR w/o evidence of acute process.patient was placed on BiPAP, as she continues to be short of breath despite treatments.Afebrile. WBC normal  -Solu-Medrol 80 mg TID -DuoNeb TID -Hold antibiotics at this time. -CT angiogram negative for PE -Echocardiogram pending  Exposure to bacterial meningitis -Discussed case at length with Dr. Michel Bickers ID.  Concern is that patient immunocompromised.  Per ID no need to start empiric antibiotics unless patient begins to exhibit signs and symptoms consistent with meningitis.  No need to place patient in isolation room unless patient begins to ask variance signs and symptoms consistent with meningitis -Have permission to contact Delara Shepheard (sister) [phone] in order to obtain current medical records on her treatment for her meningitis   Chronic nonallergic rhinitis and conjunctivitis,  -The patient will continue on nasal sprays     History of ESRD S/P Renal transplant,  (baseline Cr 1.21) -Previous HD patient  - Azathioprine 50 mg daily -Solu-Medrol (for respiratory distress instead of her home dose prednisone) - Tacrolimus 5 mg BID Recent Labs  Lab 02/12/18 0918 02/13/18 0305 02/14/18 0223  CREATININE  1.21* 1.67* 2.17*  -Slightly above baseline -Hold all nephrotoxic agents   Thrombocytopenia,  -Mild thrombocytopenia and presence chronic tacrolimus: currently at 137,000 -No signs of overt bleeding - Monitor closely,   Essential Hypertension   -Amlodipine 10 mg daily -Hydralazine IV 5 mg QID  Hyperlipidemia -3/28 increase  Crestor 10 mg daily -Lipid panel not within ADA guidelines -LDL goal<70   GERD, no acute symptoms Continue PPI   Type II Diabetes controlled with complication   -6/60 Hemoglobin A1c = 5.7  -3/28 increase Lantus 8 mg daily : Secondary to high-dose steroids -Moderate SSI   CAD,  EKG NSR ,  Tn 0   last cardiac catheterization 2011 p MI , . Continue ASA,  high dose statin, and Norvasc    DVT prophylaxis: Lovenox Code Status: Full Family Communication: None Disposition Plan: TBD   Consultants:  None  Procedures/Significant Events:  3/26 CT angiogram chest PE protocol:No evidence of pulmonary embolism. 2. Solitary new solid 4 mm right middle lobe pulmonary nodule. No follow-up needed if patient is low-risk. Non-contrast chest CT can be considered in 12 months if patient is high-risk. This recommendation follows the consensus statement: Guidelines for Management of Incidental Pulmonary Nodules Detected on CT Images:From the Fleischner Society 2017; published online before print (10.1148/radiol.[id number]). 3. 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 4. Evidence of bronchomalacia, particularly in the central right airways. 5. Dilated main pulmonary artery, suggesting pulmonary arterial hypertension.    I have personally reviewed and interpreted all radiology studies and my findings are as above.  VENTILATOR SETTINGS:    Cultures   Antimicrobials: Anti-infectives (From admission, onward)   None       Devices    LINES / TUBES:      Continuous Infusions:   Objective: Vitals:   02/14/18 1600 02/14/18 1700 02/14/18 1800 02/14/18 1900  BP:      Pulse: (!) 34 (!) 102 (!) 104 (!) 107  Resp: (!) 29 17 17 17   Temp:    98.5 F (36.9 C)  TempSrc:    Oral  SpO2: 95% 97% 96% 97%  Weight:      Height:        Intake/Output Summary (Last 24 hours) at 02/14/2018 1914 Last data filed at 02/14/2018  1700 Gross per 24 hour  Intake 720 ml  Output 350 ml  Net 370 ml   Filed Weights   02/12/18 2026 02/13/18 0500 02/14/18 0300  Weight: 159 lb 2.8 oz (72.2 kg) 159 lb 9.8 oz (72.4 kg) 162 lb 14.7 oz (73.9 kg)    Physical Exam:  General: A/O 4, positive acute respiratory distress Neck:  Negative scars, masses, torticollis, lymphadenopathy, JVD Lungs: poor air movement diffusely (improved), diffuse expiratory wheeze, negative crackles  Cardiovascular: Regular rhythm and rate without murmur gallop or rub normal S1 and S2 Abdomen: negative abdominal pain, nondistended, positive soft, bowel sounds, no rebound, no ascites, no appreciable mass Extremities: No significant cyanosis, clubbing, or edema bilateral lower extremities Skin: Negative rashes, lesions, ulcers Psychiatric:  Negative depression, negative anxiety, negative fatigue, negative mania  Central nervous system:  Cranial nerves II through XII intact, tongue/uvula midline, all extremities muscle strength 5/5, sensation intact throughout,  negative dysarthria, negative expressive aphasia, negative receptive aphasia.  .     Data Reviewed: Care during the described time interval was provided by me .  I have reviewed this patient's available data, including medical history, events of note, physical examination, and all test results  as part of my evaluation.   CBC: Recent Labs  Lab 02/12/18 0918 02/13/18 0305 02/14/18 0223  WBC 8.3 9.9 21.7*  HGB 13.4 12.1 13.7  HCT 39.7 35.4* 38.6  MCV 76.1* 73.3* 74.5*  PLT 137* 136* 176*   Basic Metabolic Panel: Recent Labs  Lab 02/12/18 0918 02/13/18 0305 02/14/18 0223  NA 141 140 136  K 3.6 3.6 4.6  CL 104 102 100*  CO2 27 22 20*  GLUCOSE 95 191* 256*  BUN 13 26* 46*  CREATININE 1.21* 1.67* 2.17*  CALCIUM 9.3 9.3 9.5  MG  --   --  2.9*   GFR: Estimated Creatinine Clearance: 21.1 mL/min (A) (by C-G formula based on SCr of 2.17 mg/dL (H)). Liver Function Tests: No results  for input(s): AST, ALT, ALKPHOS, BILITOT, PROT, ALBUMIN in the last 168 hours. No results for input(s): LIPASE, AMYLASE in the last 168 hours. No results for input(s): AMMONIA in the last 168 hours. Coagulation Profile: No results for input(s): INR, PROTIME in the last 168 hours. Cardiac Enzymes: Recent Labs  Lab 02/13/18 0914 02/13/18 1422 02/13/18 2155  TROPONINI 0.09* 0.12* 0.14*   BNP (last 3 results) No results for input(s): PROBNP in the last 8760 hours. HbA1C: Recent Labs    02/13/18 0305  HGBA1C 5.7*   CBG: Recent Labs  Lab 02/13/18 2102 02/14/18 0730 02/14/18 1016 02/14/18 1220 02/14/18 1616  GLUCAP 289* 243* 249* 137* 185*   Lipid Profile: Recent Labs    02/14/18 0223  CHOL 182  HDL 69  LDLCALC 93  TRIG 102  CHOLHDL 2.6   Thyroid Function Tests: No results for input(s): TSH, T4TOTAL, FREET4, T3FREE, THYROIDAB in the last 72 hours. Anemia Panel: No results for input(s): VITAMINB12, FOLATE, FERRITIN, TIBC, IRON, RETICCTPCT in the last 72 hours. Urine analysis:    Component Value Date/Time   COLORURINE YELLOW 02/12/2018 2129   APPEARANCEUR CLEAR 02/12/2018 2129   LABSPEC >1.046 (H) 02/12/2018 2129   PHURINE 5.0 02/12/2018 2129   GLUCOSEU NEGATIVE 02/12/2018 2129   GLUCOSEU NEGATIVE 06/21/2012 0819   HGBUR NEGATIVE 02/12/2018 2129   BILIRUBINUR NEGATIVE 02/12/2018 2129   KETONESUR 5 (A) 02/12/2018 2129   PROTEINUR 100 (A) 02/12/2018 2129   UROBILINOGEN 0.2 12/10/2012 1056   NITRITE NEGATIVE 02/12/2018 2129   LEUKOCYTESUR NEGATIVE 02/12/2018 2129   Sepsis Labs: @LABRCNTIP (procalcitonin:4,lacticidven:4)  ) Recent Results (from the past 240 hour(s))  MRSA PCR Screening     Status: None   Collection Time: 02/12/18  8:29 PM  Result Value Ref Range Status   MRSA by PCR NEGATIVE NEGATIVE Final    Comment:        The GeneXpert MRSA Assay (FDA approved for NASAL specimens only), is one component of a comprehensive MRSA colonization surveillance  program. It is not intended to diagnose MRSA infection nor to guide or monitor treatment for MRSA infections. Performed at [facility], [place] [address], [place], [place] [address]          Radiology Studies: No results found.      Scheduled Meds: . amLODipine  10 mg Oral q morning - 10a  . aspirin  81 mg Oral Daily  . azaTHIOprine  50 mg Oral Daily  . azelastine  1 spray Each Nare BID  . calcitRIOL  0.25 mcg Oral Daily  . enoxaparin (LOVENOX) injection  30 mg Subcutaneous Q24H  . fluticasone  1 spray Each Nare BID  . hydrALAZINE  5 mg Intravenous Q6H  . insulin  aspart  0-15 Units Subcutaneous TID WC  . insulin glargine  5 Units Subcutaneous Daily  . ipratropium-albuterol  3 mL Nebulization TID  . mouth rinse  15 mL Mouth Rinse BID  . methylPREDNISolone (SOLU-MEDROL) injection  80 mg Intravenous Q8H  . pantoprazole  40 mg Oral Daily  . rosuvastatin  5 mg Oral QHS  . tacrolimus  5 mg Oral Q12H   Continuous Infusions:   LOS: 1 day    Time spent: 40 minutes    WOODS, Geraldo Docker, MD [facility] Pager [phone]   If 7PM-7AM, please contact night-coverage [email or web address] Password [facility] 02/14/2018, 7:14 PM

## 2018-02-14 NOTE — Progress Notes (Signed)
Spoke with Marcene Brawn from Infection Prevention. She advised the following: Droplet Precautions should be used for suspected or confirmed diagnosis of bacterial meningitis. After beginning treatment and once asymptomatic for 24 hours can come off of droplet precautions. Patient does not need transfer to negative pressure room for airborne precautions. MD Sherral Hammers made aware via text page.

## 2018-02-15 ENCOUNTER — Inpatient Hospital Stay (HOSPITAL_COMMUNITY): Payer: Medicare Other

## 2018-02-15 DIAGNOSIS — Z888 Allergy status to other drugs, medicaments and biological substances status: Secondary | ICD-10-CM

## 2018-02-15 DIAGNOSIS — I509 Heart failure, unspecified: Secondary | ICD-10-CM

## 2018-02-15 LAB — CBC
HCT: 37.6 % (ref 36.0–46.0)
HEMOGLOBIN: 13.1 g/dL (ref 12.0–15.0)
MCH: 25.3 pg — AB (ref 26.0–34.0)
MCHC: 34.8 g/dL (ref 30.0–36.0)
MCV: 72.6 fL — AB (ref 78.0–100.0)
Platelets: 153 10*3/uL (ref 150–400)
RBC: 5.18 MIL/uL — AB (ref 3.87–5.11)
RDW: 16.8 % — ABNORMAL HIGH (ref 11.5–15.5)
WBC: 18.6 10*3/uL — AB (ref 4.0–10.5)

## 2018-02-15 LAB — ECHOCARDIOGRAM COMPLETE
Height: 62 in
Weight: 2567.92 oz

## 2018-02-15 LAB — GLUCOSE, CAPILLARY
GLUCOSE-CAPILLARY: 194 mg/dL — AB (ref 65–99)
GLUCOSE-CAPILLARY: 223 mg/dL — AB (ref 65–99)
Glucose-Capillary: 179 mg/dL — ABNORMAL HIGH (ref 65–99)
Glucose-Capillary: 166 mg/dL — ABNORMAL HIGH (ref 65–99)

## 2018-02-15 LAB — BASIC METABOLIC PANEL
ANION GAP: 12 (ref 5–15)
BUN: 49 mg/dL — ABNORMAL HIGH (ref 6–20)
CALCIUM: 9.3 mg/dL (ref 8.9–10.3)
CHLORIDE: 100 mmol/L — AB (ref 101–111)
CO2: 23 mmol/L (ref 22–32)
Creatinine, Ser: 1.69 mg/dL — ABNORMAL HIGH (ref 0.44–1.00)
GFR calc non Af Amer: 28 mL/min — ABNORMAL LOW (ref >60)
GFR, EST AFRICAN AMERICAN: 33 mL/min — AB (ref >60)
Glucose, Bld: 189 mg/dL — ABNORMAL HIGH (ref 65–99)
Potassium: 4.5 mmol/L (ref 3.5–5.1)
SODIUM: 135 mmol/L (ref 135–145)

## 2018-02-15 LAB — MAGNESIUM: MAGNESIUM: 2.9 mg/dL — AB (ref 1.7–2.4)

## 2018-02-15 MED ORDER — ROSUVASTATIN CALCIUM 10 MG PO TABS: 5.0000 mg | ORAL_TABLET | Freq: Every day | ORAL | Status: DC

## 2018-02-15 NOTE — Plan of Care (Signed)
  Problem: Activity: Goal: Risk for activity intolerance will decrease Outcome: Not Progressing Note:  Pt continues to have significant dyspnea on exertion, pt needs several minutes to recover from transferring to/from the [facility].

## 2018-02-15 NOTE — Progress Notes (Signed)
[facility] PROGRESS NOTE    Progress Note  [name]  [id number]:[id number] DOB: Oct 29, 1941 DOA: 02/12/2018 PCP: [name] Shin, MD     Brief Narrative:   [name] is an 77 y.o. female past medical history of chronic asthma status post cardiac cath in January 2019 diabetes mellitus type 2, end-stage renal disease status post renal transplant in September 2012 on tacrolimus and chronic steroids presents with severe shortness of breath to the ED.  Assessment/Plan:   Acute respiratory failure with hypoxia due to Severe persistent asthma with acute exacerbation Continue IV steroids and inhalers, now off BiPAP and is improving slowly.  She has remained afebrile with no leukocytosis. CT Angie of the chest was negative for PE Continue to hold empiric antibiotics. 2D echo is pending We will not ambulate patient and check saturations as she cannot speak in full sentences.  Exposure to bacterial meningitis: Disposition discussed the case with Dr. Megan Ward and due to her immunocompromise state, there was no need to start empiric antibiotic unless she experienced symptoms of meningitis.  Acute kidney injury chronic kidney disease status post renal transplant: With a baseline creatinine around 1.2, peak at 2.1 is slowly coming down. Likely prerenal in etiology improving with IV fluid hydration, check a basic metabolic panel in the morning. Hold nephrotoxic agents.  Chronic nonallergic rhinitis Continue nasal spray.  Thrombocytopenia: Signs of overt bleeding.  Now resolved.  Essential hypertension: Continue amlodipine 10 mg daily, blood pressure is improved.  Hyperlipidemia: Continue Crestor.  GERD: Continue PPI.  Diabetes mellitus type 2: A1c of 5.7 continue Lantus plus sliding scale.  Remember that he is on steroids.  Coronary artery disease: Continue aspirin high-dose statin and Norvasc.  DVT prophylaxis: lovenox Family Communication:none Disposition  Plan/Barrier to D/C: home in 4-5 days Code Status:     Code Status Orders  (From admission, onward)        Start     Ordered   02/12/18 1123  Full code  Continuous     02/12/18 1128    Code Status History    This patient has a current code status but no historical code status.        IV Access:    Peripheral IV   Procedures and diagnostic studies:   No results found.   Medical Consultants:    None.  Anti-Infectives:   None  Subjective:    [name] she relates her breathing is slightly better but still difficulty breathing especially when she moves around or walks to the bathroom.  Continues to have a persistent cough.  Objective:    Vitals:   02/15/18 0138 02/15/18 0500 02/15/18 0800 02/15/18 0802  BP: (!) 146/63 140/65  (!) 149/66  Pulse:  (!) 102 (!) 104 (!) 103  Resp:  16 16 18   Temp:  98.5 F (36.9 C)  98.4 F (36.9 C)  TempSrc:  Oral  Oral  SpO2:  94% 96% 95%  Weight:  72.8 kg (160 lb 7.9 oz)    Height:        Intake/Output Summary (Last 24 hours) at 02/15/2018 0950 Last data filed at 02/14/2018 2200 Gross per 24 hour  Intake 720 ml  Output 650 ml  Net 70 ml   Filed Weights   02/14/18 0300 02/15/18 0027 02/15/18 0500  Weight: 73.9 kg (162 lb 14.7 oz) 73 kg (160 lb 15 oz) 72.8 kg (160 lb 7.9 oz)    Exam: General exam: cannot speak in full sentences. Respiratory  system: Poor air movement with wheezing bilaterally. Cardiovascular system: S1 & S2 heard, RRR.  Gastrointestinal system: Abdomen is nondistended, soft and nontender.  Central nervous system: Alert and oriented. No focal neurological deficits. Extremities: No pedal edema. Skin: No rashes, lesions or ulcers   Data Reviewed:    Labs: Basic Metabolic Panel: Recent Labs  Lab 02/12/18 0918 02/13/18 0305 02/14/18 0223 02/15/18 0258  NA 141 140 136 135  K 3.6 3.6 4.6 4.5  CL 104 102 100* 100*  CO2 27 22 20* 23  GLUCOSE 95 191* 256* 189*  BUN 13 26* 46* 49*    CREATININE 1.21* 1.67* 2.17* 1.69*  CALCIUM 9.3 9.3 9.5 9.3  MG  --   --  2.9* 2.9*   GFR Estimated Creatinine Clearance: 26.9 mL/min (A) (by C-G formula based on SCr of 1.69 mg/dL (H)). Liver Function Tests: No results for input(s): AST, ALT, ALKPHOS, BILITOT, PROT, ALBUMIN in the last 168 hours. No results for input(s): LIPASE, AMYLASE in the last 168 hours. No results for input(s): AMMONIA in the last 168 hours. Coagulation profile No results for input(s): INR, PROTIME in the last 168 hours.  CBC: Recent Labs  Lab 02/12/18 0918 02/13/18 0305 02/14/18 0223 02/15/18 0258  WBC 8.3 9.9 21.7* 18.6*  HGB 13.4 12.1 13.7 13.1  HCT 39.7 35.4* 38.6 37.6  MCV 76.1* 73.3* 74.5* 72.6*  PLT 137* 136* 145* 153   Cardiac Enzymes: Recent Labs  Lab 02/13/18 0914 02/13/18 1422 02/13/18 2155  TROPONINI 0.09* 0.12* 0.14*   BNP (last 3 results) No results for input(s): PROBNP in the last 8760 hours. CBG: Recent Labs  Lab 02/14/18 1016 02/14/18 1220 02/14/18 1616 02/14/18 2114 02/15/18 0800  GLUCAP 249* 137* 185* 186* 179*   D-Dimer: No results for input(s): DDIMER in the last 72 hours. Hgb A1c: Recent Labs    02/13/18 0305  HGBA1C 5.7*   Lipid Profile: Recent Labs    02/14/18 0223  CHOL 182  HDL 69  LDLCALC 93  TRIG 102  CHOLHDL 2.6   Thyroid function studies: No results for input(s): TSH, T4TOTAL, T3FREE, THYROIDAB in the last 72 hours.  Invalid input(s): FREET3 Anemia work up: No results for input(s): VITAMINB12, FOLATE, FERRITIN, TIBC, IRON, RETICCTPCT in the last 72 hours. Sepsis Labs: Recent Labs  Lab 02/12/18 0918 02/13/18 0305 02/14/18 0223 02/15/18 0258  WBC 8.3 9.9 21.7* 18.6*   Microbiology Recent Results (from the past 240 hour(s))  MRSA PCR Screening     Status: None   Collection Time: 02/12/18  8:29 PM  Result Value Ref Range Status   MRSA by PCR NEGATIVE NEGATIVE Final    Comment:        The GeneXpert MRSA Assay (FDA approved for  NASAL specimens only), is one component of a comprehensive MRSA colonization surveillance program. It is not intended to diagnose MRSA infection nor to guide or monitor treatment for MRSA infections. Performed at [facility], [place] [address], [place], [place] [address]      Medications:   . amLODipine  10 mg Oral q morning - 10a  . aspirin  81 mg Oral Daily  . azaTHIOprine  50 mg Oral Daily  . azelastine  1 spray Each Nare BID  . calcitRIOL  0.25 mcg Oral Daily  . enoxaparin (LOVENOX) injection  30 mg Subcutaneous Q24H  . fluticasone  1 spray Each Nare BID  . hydrALAZINE  5 mg Intravenous Q6H  . insulin aspart  0-15 Units Subcutaneous  TID WC  . insulin glargine  8 Units Subcutaneous Daily  . ipratropium-albuterol  3 mL Nebulization TID  . mouth rinse  15 mL Mouth Rinse BID  . methylPREDNISolone (SOLU-MEDROL) injection  80 mg Intravenous Q8H  . pantoprazole  40 mg Oral Daily  . rosuvastatin  10 mg Oral QHS  . tacrolimus  5 mg Oral Q12H   Continuous Infusions:    LOS: 2 days   Charlynne Cousins  [facility] Pager [phone]  *Please refer to [email or web address], password [email or web address] to get updated schedule on who will round on this patient, as hospitalists switch teams weekly. If 7PM-7AM, please contact night-coverage at [email or web address], password [email or web address] for any overnight needs.  02/15/2018, 9:50 AM

## 2018-02-15 NOTE — Progress Notes (Signed)
  Echocardiogram 2D Echocardiogram has been performed.  [name] 02/15/2018, 8:48 AM

## 2018-02-16 LAB — BASIC METABOLIC PANEL
Anion gap: 10 (ref 5–15)
BUN: 44 mg/dL — AB (ref 6–20)
CALCIUM: 8.9 mg/dL (ref 8.9–10.3)
CO2: 24 mmol/L (ref 22–32)
Chloride: 100 mmol/L — ABNORMAL LOW (ref 101–111)
Creatinine, Ser: 1.5 mg/dL — ABNORMAL HIGH (ref 0.44–1.00)
GFR calc non Af Amer: 33 mL/min — ABNORMAL LOW (ref >60)
GFR, EST AFRICAN AMERICAN: 38 mL/min — AB (ref >60)
Glucose, Bld: 169 mg/dL — ABNORMAL HIGH (ref 65–99)
Potassium: 5.2 mmol/L — ABNORMAL HIGH (ref 3.5–5.1)
SODIUM: 134 mmol/L — AB (ref 135–145)

## 2018-02-16 LAB — CBC
HCT: 36.3 % (ref 36.0–46.0)
Hemoglobin: 12.4 g/dL (ref 12.0–15.0)
MCH: 25.1 pg — ABNORMAL LOW (ref 26.0–34.0)
MCHC: 34.2 g/dL (ref 30.0–36.0)
MCV: 73.3 fL — ABNORMAL LOW (ref 78.0–100.0)
PLATELETS: 142 10*3/uL — AB (ref 150–400)
RBC: 4.95 MIL/uL (ref 3.87–5.11)
RDW: 16.6 % — AB (ref 11.5–15.5)
WBC: 13.9 10*3/uL — AB (ref 4.0–10.5)

## 2018-02-16 LAB — GLUCOSE, CAPILLARY
GLUCOSE-CAPILLARY: 159 mg/dL — AB (ref 65–99)
GLUCOSE-CAPILLARY: 147 mg/dL — AB (ref 65–99)
Glucose-Capillary: 182 mg/dL — ABNORMAL HIGH (ref 65–99)
Glucose-Capillary: 176 mg/dL — ABNORMAL HIGH (ref 65–99)

## 2018-02-16 LAB — MAGNESIUM: MAGNESIUM: 2.7 mg/dL — AB (ref 1.7–2.4)

## 2018-02-16 MED ORDER — INSULIN ASPART 100 UNIT/ML ~~LOC~~ SOLN: 0.0000 [IU] | Freq: Every day | SUBCUTANEOUS | Status: DC

## 2018-02-16 MED ORDER — INSULIN ASPART 100 UNIT/ML ~~LOC~~ SOLN
0.0000 [IU] | Freq: Three times a day (TID) | SUBCUTANEOUS | Status: DC
  Administered 2018-02-16 – 2018-02-17 (×2): 4 [IU] via SUBCUTANEOUS
  Administered 2018-02-17: 3 [IU] via SUBCUTANEOUS
  Administered 2018-02-17 – 2018-02-18 (×3): 4 [IU] via SUBCUTANEOUS
  Administered 2018-02-18: 7 [IU] via SUBCUTANEOUS
  Administered 2018-02-19 (×3): 3 [IU] via SUBCUTANEOUS
  Administered 2018-02-20: 4 [IU] via SUBCUTANEOUS
  Administered 2018-02-20: 3 [IU] via SUBCUTANEOUS
  Administered 2018-02-21: 4 [IU] via SUBCUTANEOUS
  Administered 2018-02-21: 1 [IU] via SUBCUTANEOUS

## 2018-02-16 MED ORDER — INSULIN GLARGINE 100 UNIT/ML ~~LOC~~ SOLN
12.0000 [IU] | Freq: Every day | SUBCUTANEOUS | Status: DC
  Administered 2018-02-17 – 2018-02-19 (×3): 12 [IU] via SUBCUTANEOUS
  Filled 2018-02-16 (×3): qty 0.12

## 2018-02-16 MED ORDER — ALBUTEROL SULFATE (2.5 MG/3ML) 0.083% IN NEBU
2.5000 mg | INHALATION_SOLUTION | RESPIRATORY_TRACT | Status: DC | PRN
  Administered 2018-02-17: 2.5 mg via RESPIRATORY_TRACT
  Filled 2018-02-16: qty 3

## 2018-02-16 NOTE — Progress Notes (Signed)
[facility] TEAM 1 - Stepdown/ICU TEAM  [name]  BJY:[id number] DOB: [birth year] DOA: 02/12/2018 PCP: Renato Shin, MD    Brief Narrative:  77 y.o. female w/ a history of chronic asthma, CAD, DM2, GERD, Gout, and ESRD s/p renal transplant September 2012 on tacrolimus and chronic steroids who presented with severe SOB.   Subjective: Pt states she feels "about the same."  She continues to wheeze.  Her appetite is limited.  She is too sob w/ exertion to attend to her own care/ADLs.    Assessment & Plan:  Acute hypoxic respiratory failure - Severe persistent asthma with acute exacerbation Continue IV steroids and inhalers w/o change today - off BiPAP - CTa chest negative for PE  Acute kidney injury on chronic kidney disease status post renal transplant baseline creatinine 1.2 - slowly improving - keep hydrated   Recent Labs  Lab 02/12/18 0918 02/13/18 0305 02/14/18 0223 02/15/18 0258 02/16/18 0312  CREATININE 1.21* 1.67* 2.17* 1.69* 1.50*    Chronic nonallergic rhinitis Continue nasal spray  Essential hypertension No change in tx today   Hyperlipidemia Continue Crestor  GERD Continue PPI  Diabetes mellitus type 2 A1c 5.7 - wean steroids asap - adjust insulin today   CAD Cont med tx   DVT prophylaxis: lovenox  Code Status: FULL CODE Family Communication: no family present at time of exam  Disposition Plan: SDU  Consultants:  none  Antimicrobials:  none  Objective: Blood pressure (!) 149/87, pulse 94, temperature 98.5 F (36.9 C), temperature source Oral, resp. rate 19, height 5\' 2"  (1.575 m), weight 73 kg (160 lb 15 oz), SpO2 95 %.  Intake/Output Summary (Last 24 hours) at 02/16/2018 1453 Last data filed at 02/16/2018 1141 Gross per 24 hour  Intake 880 ml  Output 900 ml  Net -20 ml   Filed Weights   02/15/18 0027 02/15/18 0500 02/16/18 0333  Weight: 73 kg (160 lb 15 oz) 72.8 kg (160 lb 7.9 oz) 73 kg (160 lb 15 oz)     Examination: General: No acute respiratory distress at rest  Lungs: diffuse moderate wheezing - no prolonged exp phase when still  Cardiovascular: Regular rate and rhythm without murmur gallop or rub normal S1 and S2 Abdomen: Nontender, nondistended, soft, bowel sounds positive, no rebound, no ascites, no appreciable mass Extremities: trace edema B LE   CBC: Recent Labs  Lab 02/14/18 0223 02/15/18 0258 02/16/18 0312  WBC 21.7* 18.6* 13.9*  HGB 13.7 13.1 12.4  HCT 38.6 37.6 36.3  MCV 74.5* 72.6* 73.3*  PLT 145* 153 086*   Basic Metabolic Panel: Recent Labs  Lab 02/14/18 0223 02/15/18 0258 02/16/18 0312  NA 136 135 134*  K 4.6 4.5 5.2*  CL 100* 100* 100*  CO2 20* 23 24  GLUCOSE 256* 189* 169*  BUN 46* 49* 44*  CREATININE 2.17* 1.69* 1.50*  CALCIUM 9.5 9.3 8.9  MG 2.9* 2.9* 2.7*   GFR: Estimated Creatinine Clearance: 30.3 mL/min (A) (by C-G formula based on SCr of 1.5 mg/dL (H)).   Cardiac Enzymes: Recent Labs  Lab 02/13/18 0914 02/13/18 1422 02/13/18 2155  TROPONINI 0.09* 0.12* 0.14*    HbA1C: Hgb A1c MFr Bld  Date/Time Value Ref Range Status  02/13/2018 03:05 AM 5.7 (H) 4.8 - 5.6 % Final    Comment:    (NOTE) Pre diabetes:          5.7%-6.4% Diabetes:              >6.4% Glycemic control  for   <7.0% adults with diabetes   02/28/2013 02:56 PM 5.5 4.6 - 6.5 % Final    Comment:    Glycemic Control Guidelines for People with Diabetes:Non Diabetic:  <6%Goal of Therapy: <7%Additional Action Suggested:  >8%     CBG: Recent Labs  Lab 02/15/18 1207 02/15/18 1645 02/15/18 2123 02/16/18 0741 02/16/18 1210  GLUCAP 194* 223* 166* 182* 176*    Recent Results (from the past 240 hour(s))  MRSA PCR Screening     Status: None   Collection Time: 02/12/18  8:29 PM  Result Value Ref Range Status   MRSA by PCR NEGATIVE NEGATIVE Final    Comment:        The GeneXpert MRSA Assay (FDA approved for NASAL specimens only), is one component of  a comprehensive MRSA colonization surveillance program. It is not intended to diagnose MRSA infection nor to guide or monitor treatment for MRSA infections. Performed at [facility], [place] [address], [place], [place] [address]      Scheduled Meds: . amLODipine  10 mg Oral q morning - 10a  . aspirin  81 mg Oral Daily  . azaTHIOprine  50 mg Oral Daily  . azelastine  1 spray Each Nare BID  . calcitRIOL  0.25 mcg Oral Daily  . enoxaparin (LOVENOX) injection  30 mg Subcutaneous Q24H  . fluticasone  1 spray Each Nare BID  . hydrALAZINE  5 mg Intravenous Q6H  . insulin aspart  0-15 Units Subcutaneous TID WC  . insulin glargine  8 Units Subcutaneous Daily  . ipratropium-albuterol  3 mL Nebulization TID  . mouth rinse  15 mL Mouth Rinse BID  . methylPREDNISolone (SOLU-MEDROL) injection  80 mg Intravenous Q8H  . pantoprazole  40 mg Oral Daily  . rosuvastatin  10 mg Oral QHS  . tacrolimus  5 mg Oral Q12H     LOS: 3 days   Cherene Altes, MD [facility] Office  [phone] Pager - Text Page per Amion as per below:  On-Call/Text Page:      [email or web address]      password [email or web address]  If 7PM-7AM, please contact night-coverage [email or web address] Password [facility] 02/16/2018, 2:53 PM

## 2018-02-17 ENCOUNTER — Inpatient Hospital Stay (HOSPITAL_COMMUNITY): Payer: Medicare Other

## 2018-02-17 DIAGNOSIS — I2510 Atherosclerotic heart disease of native coronary artery without angina pectoris: Secondary | ICD-10-CM

## 2018-02-17 DIAGNOSIS — K219 Gastro-esophageal reflux disease without esophagitis: Secondary | ICD-10-CM

## 2018-02-17 DIAGNOSIS — Z951 Presence of aortocoronary bypass graft: Secondary | ICD-10-CM

## 2018-02-17 DIAGNOSIS — N183 Chronic kidney disease, stage 3 (moderate): Secondary | ICD-10-CM

## 2018-02-17 DIAGNOSIS — E1122 Type 2 diabetes mellitus with diabetic chronic kidney disease: Secondary | ICD-10-CM

## 2018-02-17 DIAGNOSIS — E7800 Pure hypercholesterolemia, unspecified: Secondary | ICD-10-CM

## 2018-02-17 DIAGNOSIS — Z794 Long term (current) use of insulin: Secondary | ICD-10-CM

## 2018-02-17 LAB — BASIC METABOLIC PANEL
Anion gap: 10 (ref 5–15)
BUN: 33 mg/dL — AB (ref 6–20)
CO2: 25 mmol/L (ref 22–32)
CREATININE: 1.31 mg/dL — AB (ref 0.44–1.00)
Calcium: 9.1 mg/dL (ref 8.9–10.3)
Chloride: 98 mmol/L — ABNORMAL LOW (ref 101–111)
GFR calc non Af Amer: 39 mL/min — ABNORMAL LOW (ref >60)
GFR, EST AFRICAN AMERICAN: 45 mL/min — AB (ref >60)
Glucose, Bld: 180 mg/dL — ABNORMAL HIGH (ref 65–99)
Potassium: 5.2 mmol/L — ABNORMAL HIGH (ref 3.5–5.1)
Sodium: 133 mmol/L — ABNORMAL LOW (ref 135–145)

## 2018-02-17 LAB — GLUCOSE, CAPILLARY
Glucose-Capillary: 161 mg/dL — ABNORMAL HIGH (ref 65–99)
Glucose-Capillary: 148 mg/dL — ABNORMAL HIGH (ref 65–99)
Glucose-Capillary: 174 mg/dL — ABNORMAL HIGH (ref 65–99)
Glucose-Capillary: 140 mg/dL — ABNORMAL HIGH (ref 65–99)

## 2018-02-17 MED ORDER — GUAIFENESIN ER 600 MG PO TB12
1200.0000 mg | ORAL_TABLET | Freq: Two times a day (BID) | ORAL | Status: DC
  Administered 2018-02-17 – 2018-02-21 (×9): 1200 mg via ORAL
  Filled 2018-02-17 (×9): qty 2

## 2018-02-17 NOTE — Evaluation (Signed)
Physical Therapy Evaluation Patient Details Name: [name] MRN: [id number] DOB: 1941-06-12 Today's Date: 02/17/2018   History of Present Illness  Patient is a 77 y/o female who presents with SOB, wheezing and cough. Admitted with acute respiratory failure with hypoxia secondary to asthma exacerbation. PMH includes kidney transplant, HTN, MI, DM. CAD, MRSA.  Clinical Impression  Patient presents with dyspnea on exertion, chest pressure, weakness, and dizziness impacting safe mobility. Tolerated standing and taking side steps along side the bed with Min A for balance/safety. Bil knee instability noted. Seems to have some underlying vestibular issues. Will have a vestibular therapist assess and treat tomorrow as able. Limited by dizziness, chest pressure and dyspnea. Sp02 ranged from 86-93% on 3L/min 02. BP elevated appropriately with activity. Pt lives alone and independent PTA. If pt does not progress well, will most likely need SNF. But if any to progress and improve, can return home with HHPT and supervision. Will follow acutely to maximize independence and mobility prior to return home.      Follow Up Recommendations SNF;Supervision for mobility/OOB    Equipment Recommendations  Rolling walker with 5" wheels    Recommendations for Other Services OT consult     Precautions / Restrictions Precautions Precautions: Fall Restrictions Weight Bearing Restrictions: No      Mobility  Bed Mobility Overal bed mobility: Modified Independent             General bed mobility comments: Use of rail and increased time. + dizziness, reports room spinning.   Transfers Overall transfer level: Needs assistance Equipment used: 1 person hand held assist Transfers: Sit to/from Stand Sit to Stand: Min assist         General transfer comment: Assist to power to standing and to steady. + dizziness.  Reports room spinning.  Ambulation/Gait Ambulation/Gait assistance: Min  assist Ambulation Distance (Feet): 15 Feet Assistive device: 1 person hand held assist Gait Pattern/deviations: Step-to pattern;Narrow base of support;Shuffle Gait velocity: decreased   General Gait Details: Side stepped along side bed multiple times with Min A (HHA for balance); bil knee instability noted. 2/4 DOE. Sp02 remained >90% on 3L/min 02. Chest pressure present after this so stopped.   Stairs            Wheelchair Mobility    Modified Rankin (Stroke Patients Only)       Balance Overall balance assessment: Needs assistance Sitting-balance support: Feet supported;Bilateral upper extremity supported Sitting balance-Leahy Scale: Poor Sitting balance - Comments: Requires BUE support secondary to dizziness and sensation of room spinning.   Standing balance support: During functional activity;Single extremity supported Standing balance-Leahy Scale: Poor Standing balance comment: Requires Min A for standing/dynamic standing balance due to weakness and dizziness.                              Pertinent Vitals/Pain Pain Assessment: No/denies pain    Home Living Family/patient expects to be discharged to:: Private residence Living Arrangements: Alone Available Help at Discharge: Family;Available PRN/intermittently Type of Home: House Home Access: Stairs to enter Entrance Stairs-Rails: Right Entrance Stairs-Number of Steps: 3 Home Layout: One level Home Equipment: None      Prior Function Level of Independence: Independent         Comments: Drives, cooks, cleans.      Hand Dominance        Extremity/Trunk Assessment   Upper Extremity Assessment Upper Extremity Assessment: Defer to OT evaluation  Lower Extremity Assessment Lower Extremity Assessment: Generalized weakness(Bil knee instability with standing )       Communication   Communication: No difficulties  Cognition Arousal/Alertness: Awake/alert Behavior During Therapy: WFL for  tasks assessed/performed Overall Cognitive Status: Within Functional Limits for tasks assessed                                 General Comments: for basic mobility tasks but not formally assessed.       General Comments General comments (skin integrity, edema, etc.): BP appropriately elevated after activity. Sp02 ranged from 86-94% on 3L/min 02. Drops when talking but recovers quickly with cues for pursed lip breathing.     Exercises     Assessment/Plan    PT Assessment Patient needs continued PT services  PT Problem List Decreased strength;Decreased mobility;Decreased activity tolerance;Cardiopulmonary status limiting activity;Decreased balance       PT Treatment Interventions Functional mobility training;Balance training;Patient/family education;Gait training;Therapeutic activities;Stair training;Therapeutic exercise    PT Goals (Current goals can be found in the Care Plan section)  Acute Rehab PT Goals Patient Stated Goal: to be able to walk and return home PT Goal Formulation: With patient Time For Goal Achievement: 03/03/18 Potential to Achieve Goals: Good    Frequency Min 3X/week   Barriers to discharge Decreased caregiver support lives alone    Co-evaluation               AM-PAC PT "6 Clicks" Daily Activity  Outcome Measure Difficulty turning over in bed (including adjusting bedclothes, sheets and blankets)?: None Difficulty moving from lying on back to sitting on the side of the bed? : None Difficulty sitting down on and standing up from a chair with arms (e.g., wheelchair, bedside commode, etc,.)?: Unable Help needed moving to and from a bed to chair (including a wheelchair)?: A Little Help needed walking in hospital room?: A Little Help needed climbing 3-5 steps with a railing? : A Lot 6 Click Score: 17    End of Session Equipment Utilized During Treatment: Oxygen Activity Tolerance: Treatment limited secondary to medical complications  (Comment);Patient limited by fatigue(chest pressure) Patient left: in bed;with call bell/phone within reach;with bed alarm set;with family/visitor present Nurse Communication: Mobility status;Other (comment)(vestibular issues, chest pressure) PT Visit Diagnosis: Unsteadiness on feet (R26.81);Difficulty in walking, not elsewhere classified (R26.2);Muscle weakness (generalized) (M62.81);Other (comment);Dizziness and giddiness (R42)(DOE)    Time: 1340-1400 PT Time Calculation (min) (ACUTE ONLY): 20 min   Charges:   PT Evaluation $PT Eval Moderate Complexity: 1 Mod     PT G CodesWray Kearns, PT, DPT [phone]    Marguarite Arbour A Jayleena Stille 02/17/2018, 2:23 PM

## 2018-02-17 NOTE — Plan of Care (Signed)
Pt still requires oxygen therapy and is SOB on exertion.

## 2018-02-17 NOTE — Progress Notes (Signed)
PROGRESS NOTE    [name]  AOZ:[id number] DOB: 04-10-1941 DOA: 02/12/2018 PCP: Renato Shin, MD   Brief Narrative: [name] is a 77 y.o. female past medical history of chronic asthma status post cardiac cath in January 2019 diabetes mellitus type 2, end-stage renal disease status post renal transplant in September 2012 on tacrolimus and chronic steroids presents with severe shortness of breath to the ED. She has been improving with treatment of her asthma exacerbation but is not at baseline.   Assessment & Plan:   Principal Problem:   Severe persistent asthma with acute exacerbation Active Problems:   GOUT   THROMBOCYTOPENIA   Secondary renal hyperparathyroidism (HCC)   Anemia   Coronary artery disease   Hyperlipidemia   Hypertension   Aspirin allergy   Hx of CABG   S/P kidney transplant   H/O steroid therapy   Diabetes (HCC)   GERD (gastroesophageal reflux disease)   Severe persistent asthma with (acute) exacerbation   Acute respiratory failure with hypoxia Secondary to asthma exacerbation. No PE on CTA. Off of Bipap now weaning down on nasal canula. -Manage asthma exacerbation -Wean to room air as able -Repeat chest x-ray today  Asthma exacerbation Severe exacerbation. Improved from admission but still with significant symptoms. -Continue Albuterol/Duoneb, Solumedrol -Mucinex and flutter valve  Hyperkalemia Mild -repeat BMP in AM  Acute kidney injury on CKD 3 History of kidney transplant Peak of 2.1. Baseline of 1.2. Continues to trend down with IV fluids. -Continue Prograf  Thrombocytopenia Stable. No bleeding.  Chronic rhinitis -Continue nasal spray  Essential hypertension Continue amlodipine  Hyperlipidemia -Continue Crestor  GERD -Continue PPI  Diabetes mellitus, type 2 -Continue Lantus and SSI  CAD Stable. -Continue Crestor  Headache Patient with exposure to bacterial meningitis. Symptoms appear consistent with  migraine -Continue morphine prn -Will add Toradol/Benadryl if no improvement/obtain head CT if neurological findings emerge   DVT prophylaxis: Lovenox Code Status:   Code Status: Full Code Family Communication: None at bedside Disposition Plan: Discharge home when weaned off of oxygen   Consultants:   None  Procedures:   Bipap  Antimicrobials:  None    Subjective: Patient with dyspnea and an episode of substernal chest pain that has been occurring overnight intermittently without exertion. Significant wheezing. Headache is recurrent, right sided and is worse with occular motion.  Objective: Vitals:   02/17/18 0602 02/17/18 0700 02/17/18 0741 02/17/18 0759  BP:  (!) 146/82  (!) 110/58  Pulse:  97  85  Resp:  17  17  Temp:  98.5 F (36.9 C)  98.3 F (36.8 C)  TempSrc:  Oral  Oral  SpO2: 97% 97% 98% 98%  Weight:      Height:        Intake/Output Summary (Last 24 hours) at 02/17/2018 0911 Last data filed at 02/17/2018 9629 Gross per 24 hour  Intake 440 ml  Output 201 ml  Net 239 ml   Filed Weights   02/15/18 0027 02/15/18 0500 02/16/18 0333  Weight: 73 kg (160 lb 15 oz) 72.8 kg (160 lb 7.9 oz) 73 kg (160 lb 15 oz)    Examination:  General exam: Appears calm and comfortable Respiratory system: Significant and diffuse wheezing, no rhonchi. Wet cough. No respiratory distress Cardiovascular system: S1 & S2 heard, RRR. No murmurs, rubs, gallops or clicks. Gastrointestinal system: Abdomen is nondistended, soft and nontender. No organomegaly or masses felt. Normal bowel sounds heard. Central nervous system: Alert and oriented. No focal neurological deficits.  Extremities: No edema. No calf tenderness Skin: No cyanosis. No rashes Psychiatry: Judgement and insight appear normal. Mood & affect appropriate.     Data Reviewed: I have personally reviewed following labs and imaging studies  CBC: Recent Labs  Lab 02/12/18 0918 02/13/18 0305 02/14/18 0223  02/15/18 0258 02/16/18 0312  WBC 8.3 9.9 21.7* 18.6* 13.9*  HGB 13.4 12.1 13.7 13.1 12.4  HCT 39.7 35.4* 38.6 37.6 36.3  MCV 76.1* 73.3* 74.5* 72.6* 73.3*  PLT 137* 136* 145* 153 947*   Basic Metabolic Panel: Recent Labs  Lab 02/13/18 0305 02/14/18 0223 02/15/18 0258 02/16/18 0312 02/17/18 0243  NA 140 136 135 134* 133*  K 3.6 4.6 4.5 5.2* 5.2*  CL 102 100* 100* 100* 98*  CO2 22 20* 23 24 25   GLUCOSE 191* 256* 189* 169* 180*  BUN 26* 46* 49* 44* 33*  CREATININE 1.67* 2.17* 1.69* 1.50* 1.31*  CALCIUM 9.3 9.5 9.3 8.9 9.1  MG  --  2.9* 2.9* 2.7*  --    GFR: Estimated Creatinine Clearance: 34.7 mL/min (A) (by C-G formula based on SCr of 1.31 mg/dL (H)). Liver Function Tests: No results for input(s): AST, ALT, ALKPHOS, BILITOT, PROT, ALBUMIN in the last 168 hours. No results for input(s): LIPASE, AMYLASE in the last 168 hours. No results for input(s): AMMONIA in the last 168 hours. Coagulation Profile: No results for input(s): INR, PROTIME in the last 168 hours. Cardiac Enzymes: Recent Labs  Lab 02/13/18 0914 02/13/18 1422 02/13/18 2155  TROPONINI 0.09* 0.12* 0.14*   BNP (last 3 results) No results for input(s): PROBNP in the last 8760 hours. HbA1C: No results for input(s): HGBA1C in the last 72 hours. CBG: Recent Labs  Lab 02/16/18 0741 02/16/18 1210 02/16/18 1643 02/16/18 2114 02/17/18 0713  GLUCAP 182* 176* 159* 147* 161*   Lipid Profile: No results for input(s): CHOL, HDL, LDLCALC, TRIG, CHOLHDL, LDLDIRECT in the last 72 hours. Thyroid Function Tests: No results for input(s): TSH, T4TOTAL, FREET4, T3FREE, THYROIDAB in the last 72 hours. Anemia Panel: No results for input(s): VITAMINB12, FOLATE, FERRITIN, TIBC, IRON, RETICCTPCT in the last 72 hours. Sepsis Labs: No results for input(s): PROCALCITON, LATICACIDVEN in the last 168 hours.  Recent Results (from the past 240 hour(s))  MRSA PCR Screening     Status: None   Collection Time: 02/12/18  8:29 PM   Result Value Ref Range Status   MRSA by PCR NEGATIVE NEGATIVE Final    Comment:        The GeneXpert MRSA Assay (FDA approved for NASAL specimens only), is one component of a comprehensive MRSA colonization surveillance program. It is not intended to diagnose MRSA infection nor to guide or monitor treatment for MRSA infections. Performed at [facility], [place] [address] [place], [place] [address]          Radiology Studies: No results found.      Scheduled Meds: . amLODipine  10 mg Oral q morning - 10a  . aspirin  81 mg Oral Daily  . azaTHIOprine  50 mg Oral Daily  . azelastine  1 spray Each Nare BID  . calcitRIOL  0.25 mcg Oral Daily  . enoxaparin (LOVENOX) injection  30 mg Subcutaneous Q24H  . fluticasone  1 spray Each Nare BID  . guaiFENesin  1,200 mg Oral BID  . hydrALAZINE  5 mg Intravenous Q6H  . insulin aspart  0-20 Units Subcutaneous TID WC  . insulin aspart  0-5 Units Subcutaneous QHS  . insulin  glargine  12 Units Subcutaneous Daily  . ipratropium-albuterol  3 mL Nebulization TID  . mouth rinse  15 mL Mouth Rinse BID  . methylPREDNISolone (SOLU-MEDROL) injection  80 mg Intravenous Q8H  . pantoprazole  40 mg Oral Daily  . rosuvastatin  10 mg Oral QHS  . tacrolimus  5 mg Oral Q12H   Continuous Infusions:   LOS: 4 days     Cordelia Poche, MD [facility] 02/17/2018, 9:11 AM Pager: [phone]  If 7PM-7AM, please contact night-coverage [email or web address] Password [facility] 02/17/2018, 9:11 AM

## 2018-02-18 LAB — BASIC METABOLIC PANEL
Anion gap: 8 (ref 5–15)
BUN: 31 mg/dL — AB (ref 6–20)
CHLORIDE: 100 mmol/L — AB (ref 101–111)
CO2: 26 mmol/L (ref 22–32)
CREATININE: 1.32 mg/dL — AB (ref 0.44–1.00)
Calcium: 8.6 mg/dL — ABNORMAL LOW (ref 8.9–10.3)
GFR calc Af Amer: 45 mL/min — ABNORMAL LOW (ref >60)
GFR calc non Af Amer: 38 mL/min — ABNORMAL LOW (ref >60)
Glucose, Bld: 157 mg/dL — ABNORMAL HIGH (ref 65–99)
Potassium: 5.2 mmol/L — ABNORMAL HIGH (ref 3.5–5.1)
SODIUM: 134 mmol/L — AB (ref 135–145)

## 2018-02-18 LAB — CBC
HCT: 34 % — ABNORMAL LOW (ref 36.0–46.0)
Hemoglobin: 11.8 g/dL — ABNORMAL LOW (ref 12.0–15.0)
MCH: 25.4 pg — AB (ref 26.0–34.0)
MCHC: 34.7 g/dL (ref 30.0–36.0)
MCV: 73.3 fL — AB (ref 78.0–100.0)
PLATELETS: 138 10*3/uL — AB (ref 150–400)
RBC: 4.64 MIL/uL (ref 3.87–5.11)
RDW: 16.8 % — AB (ref 11.5–15.5)
WBC: 9.8 10*3/uL (ref 4.0–10.5)

## 2018-02-18 LAB — GLUCOSE, CAPILLARY
Glucose-Capillary: 159 mg/dL — ABNORMAL HIGH (ref 65–99)
Glucose-Capillary: 164 mg/dL — ABNORMAL HIGH (ref 65–99)
Glucose-Capillary: 209 mg/dL — ABNORMAL HIGH (ref 65–99)
Glucose-Capillary: 146 mg/dL — ABNORMAL HIGH (ref 65–99)

## 2018-02-18 MED ORDER — METHYLPREDNISOLONE SODIUM SUCC 40 MG IJ SOLR
40.0000 mg | Freq: Two times a day (BID) | INTRAMUSCULAR | Status: DC
  Administered 2018-02-19: 40 mg via INTRAVENOUS
  Filled 2018-02-18 (×2): qty 1

## 2018-02-18 NOTE — Progress Notes (Signed)
PROGRESS NOTE    DEBAR PLATE  QIO:[id number] DOB: 09/30/1941 DOA: 02/12/2018 PCP: Renato Shin, MD    Brief Narrative:  77 year old female who presented with dyspnea. She does have significant past medical history of asthma, coronary disease, type 2 diabetes mellitus and end stage kidney disease status post renal transplant. Patient  developed worsening dyspnea, it was refractive to bronchodilator therapy at home. On the initial physical examination blood pressure 133/65, heart rate 102, respiratory 24, oxygen saturation 98%, moist mucous membranes, diffuse wheezing bilaterally, poor air movement, heart S1-S2 present, tachycardic, abdomen soft, no lower extremity edema.  Patient was admitted to the hospital with working diagnosis of acute asthma exacerbation complicated by impending acute respiratory failure.   Assessment & Plan:   Principal Problem:   Severe persistent asthma with acute exacerbation Active Problems:   GOUT   THROMBOCYTOPENIA   Secondary renal hyperparathyroidism (HCC)   Anemia   Coronary artery disease   Hyperlipidemia   Hypertension   Aspirin allergy   Hx of CABG   S/P kidney transplant   H/O steroid therapy   Diabetes (HCC)   GERD (gastroesophageal reflux disease)   Severe persistent asthma with (acute) exacerbation   1. Acute hypoxic respiratory failure due to asthma exacerbation. Clinically improving, will continue oxymetry monitoring, supplemental 02 per [place], and aggressive bronchodilator therapy, with albuterol and ipratropium, still with significant wheezing. Will add peak flow meter for close monitoring. Out of bed as tolerated, continue systemic steroids with 40 mg IV q12 hours.   2. Hyperkalemia. K at 5,2, will continue to follow on electrolytes in am.   3. AKI on CKD stage 3. Serum cr at 1,32 with serum bicarbonate at 26. Avoid nephrotoxic medications.   4. HTN. Continue blood pressure monitoring, continue blood pressure control with amlodipine.    5. T2DM. Capillary glucose cover and monitoring with insulin sliding scale, patient tolerating po well. Basal insulin therapy with 12 units glargine daily.   6. SP renal transplant. No clinical signs of rejection, will continue imuran,and tacrolimus.    DVT prophylaxis: heparin sq  Code Status: full Family Communication: no family at the bedside  Disposition Plan: home when clinically improved    Consultants:     Procedures:     Antimicrobials:       Subjective: Dyspnea is improving but not back to baseline yet, persistent dyspnea on exertion, no nausea or vomiting. At home uses albuterol rescue inhaler as needed about twice per week, not at night.  Objective: Vitals:   02/18/18 0914 02/18/18 1044 02/18/18 1209 02/18/18 1450  BP:  126/67 136/67   Pulse:   96   Resp:   20   Temp:   98.8 F (37.1 C)   TempSrc:   Oral   SpO2: 97%  92% 96%  Weight:      Height:        Intake/Output Summary (Last 24 hours) at 02/18/2018 1455 Last data filed at 02/18/2018 1300 Gross per 24 hour  Intake 920 ml  Output 300 ml  Net 620 ml   Filed Weights   02/15/18 0027 02/15/18 0500 02/16/18 0333  Weight: 73 kg (160 lb 15 oz) 72.8 kg (160 lb 7.9 oz) 73 kg (160 lb 15 oz)    Examination:   General: Not in pain or dyspnea, deconditioned. Speaking in short sentences Neurology: Awake and alert, non focal  E ENT: no pallor, no icterus, oral mucosa moist Cardiovascular: No JVD. S1-S2 present, rhythmic, no gallops, rubs, or murmurs.  No lower extremity edema. Pulmonary: decreased  breath sounds bilaterally, poor air movement, positive diffuse bilateral inspiratory and expirataory wheezing, positive rhonchi, but noe rales. Gastrointestinal. Abdomen with no organomegaly, non tender, no rebound or guarding Skin. No rashes Musculoskeletal: no joint deformities     Data Reviewed: I have personally reviewed following labs and imaging studies  CBC: Recent Labs  Lab 02/13/18 0305  02/14/18 0223 02/15/18 0258 02/16/18 0312 02/18/18 0249  WBC 9.9 21.7* 18.6* 13.9* 9.8  HGB 12.1 13.7 13.1 12.4 11.8*  HCT 35.4* 38.6 37.6 36.3 34.0*  MCV 73.3* 74.5* 72.6* 73.3* 73.3*  PLT 136* 145* 153 142* 841*   Basic Metabolic Panel: Recent Labs  Lab 02/14/18 0223 02/15/18 0258 02/16/18 0312 02/17/18 0243 02/18/18 0249  NA 136 135 134* 133* 134*  K 4.6 4.5 5.2* 5.2* 5.2*  CL 100* 100* 100* 98* 100*  CO2 20* 23 24 25 26   GLUCOSE 256* 189* 169* 180* 157*  BUN 46* 49* 44* 33* 31*  CREATININE 2.17* 1.69* 1.50* 1.31* 1.32*  CALCIUM 9.5 9.3 8.9 9.1 8.6*  MG 2.9* 2.9* 2.7*  --   --    GFR: Estimated Creatinine Clearance: 34.5 mL/min (A) (by C-G formula based on SCr of 1.32 mg/dL (H)). Liver Function Tests: No results for input(s): AST, ALT, ALKPHOS, BILITOT, PROT, ALBUMIN in the last 168 hours. No results for input(s): LIPASE, AMYLASE in the last 168 hours. No results for input(s): AMMONIA in the last 168 hours. Coagulation Profile: No results for input(s): INR, PROTIME in the last 168 hours. Cardiac Enzymes: Recent Labs  Lab 02/13/18 0914 02/13/18 1422 02/13/18 2155  TROPONINI 0.09* 0.12* 0.14*   BNP (last 3 results) No results for input(s): PROBNP in the last 8760 hours. HbA1C: No results for input(s): HGBA1C in the last 72 hours. CBG: Recent Labs  Lab 02/17/18 1211 02/17/18 1706 02/17/18 2204 02/18/18 0730 02/18/18 1128  GLUCAP 148* 174* 140* 159* 164*   Lipid Profile: No results for input(s): CHOL, HDL, LDLCALC, TRIG, CHOLHDL, LDLDIRECT in the last 72 hours. Thyroid Function Tests: No results for input(s): TSH, T4TOTAL, FREET4, T3FREE, THYROIDAB in the last 72 hours. Anemia Panel: No results for input(s): VITAMINB12, FOLATE, FERRITIN, TIBC, IRON, RETICCTPCT in the last 72 hours.    Radiology Studies: I have reviewed all of the imaging during this hospital visit personally     Scheduled Meds: . amLODipine  10 mg Oral q morning - 10a  .  aspirin  81 mg Oral Daily  . azaTHIOprine  50 mg Oral Daily  . azelastine  1 spray Each Nare BID  . calcitRIOL  0.25 mcg Oral Daily  . enoxaparin (LOVENOX) injection  30 mg Subcutaneous Q24H  . fluticasone  1 spray Each Nare BID  . guaiFENesin  1,200 mg Oral BID  . hydrALAZINE  5 mg Intravenous Q6H  . insulin aspart  0-20 Units Subcutaneous TID WC  . insulin aspart  0-5 Units Subcutaneous QHS  . insulin glargine  12 Units Subcutaneous Daily  . ipratropium-albuterol  3 mL Nebulization TID  . mouth rinse  15 mL Mouth Rinse BID  . methylPREDNISolone (SOLU-MEDROL) injection  80 mg Intravenous Q8H  . pantoprazole  40 mg Oral Daily  . rosuvastatin  10 mg Oral QHS  . tacrolimus  5 mg Oral Q12H   Continuous Infusions:   LOS: 5 days        Petrina Melby Gerome Apley, MD [facility] Pager [phone]

## 2018-02-18 NOTE — Evaluation (Signed)
Occupational Therapy Evaluation Patient Details Name: [name] MRN: [id number] DOB: [birth year] Today's Date: 02/18/2018    History of Present Illness Patient is a 77 y/o female who presents with SOB, wheezing and cough. Admitted with acute respiratory failure with hypoxia secondary to asthma exacerbation. PMH includes kidney transplant, HTN, MI, DM. CAD, MRSA.   Clinical Impression   Pt was ambulating with a RW and independent in self care. She reports struggling with housekeeping and heavy meal prep. Pt presents with generalized weakness, poor endurance and impaired standing balance. She requires min assist for ADL and ADL transfers. Pt is refusing SNF, recommending [place] and home health aide. Will follow.    Follow Up Recommendations  Home health OT, home health aide   Equipment Recommendations  Tub/shower bench    Recommendations for Other Services       Precautions / Restrictions Precautions Precautions: Fall Precaution Comments: watch 02 Restrictions Weight Bearing Restrictions: No      Mobility Bed Mobility Overal bed mobility: Modified Independent             General bed mobility comments: HOB up, used rail  Transfers Overall transfer level: Needs assistance Equipment used: Rolling walker (2 wheeled) Transfers: Sit to/from Stand Sit to Stand: Min guard         General transfer comment: no physical assist, cues for hand placement as pt pulling up on walker    Balance Overall balance assessment: Needs assistance Sitting-balance support: Feet supported;No upper extremity supported Sitting balance-Leahy Scale: Good Sitting balance - Comments: no LOB with donning socks   Standing balance support: During functional activity;Bilateral upper extremity supported Standing balance-Leahy Scale: Fair Standing balance comment: can statically standing without support                           ADL either performed or assessed with clinical  judgement   ADL Overall ADL's : Needs assistance/impaired Eating/Feeding: Independent;Bed level   Grooming: Supervision/safety;Standing   Upper Body Bathing: Set up;Sitting   Lower Body Bathing: Supervison/ safety;Sit to/from stand   Upper Body Dressing : Set up;Sitting   Lower Body Dressing: Supervision/safety;Sit to/from stand   Toilet Transfer: Supervision/safety;Ambulation;RW   Toileting- Clothing Manipulation and Hygiene: Supervision/safety;Sit to/from stand         General ADL Comments: pt educated in breathing techniques and benefits of seated showering     Vision Baseline Vision/History: No visual deficits       Perception     Praxis      Pertinent Vitals/Pain Pain Assessment: No/denies pain     Hand Dominance Right   Extremity/Trunk Assessment Upper Extremity Assessment Upper Extremity Assessment: Overall WFL for tasks assessed   Lower Extremity Assessment Lower Extremity Assessment: Defer to PT evaluation   Cervical / Trunk Assessment Cervical / Trunk Assessment: Normal   Communication Communication Communication: No difficulties   Cognition Arousal/Alertness: Awake/alert Behavior During Therapy: WFL for tasks assessed/performed Overall Cognitive Status: Within Functional Limits for tasks assessed                                 General Comments: for basic mobility tasks but not formally assessed.    General Comments  Pt desat to 87% on RA.  Requires 2LO2 at rest and 3L with activity to keep sats >90%.     Exercises     Shoulder Instructions  Home Living Family/patient expects to be discharged to:: Private residence Living Arrangements: Alone Available Help at Discharge: Family;Available PRN/intermittently Type of Home: House Home Access: Stairs to enter [employer] of Steps: 3 Entrance Stairs-Rails: Right Home Layout: One level     Bathroom Shower/Tub: Teacher, early years/pre: Standard      Home Equipment: Environmental consultant - 2 wheels;Bedside commode          Prior Functioning/Environment Level of Independence: Independent        Comments: Drives, warms meals, struggles with housekeeping.        OT Problem List: Decreased activity tolerance;Impaired balance (sitting and/or standing);Cardiopulmonary status limiting activity;Decreased knowledge of use of DME or AE      OT Treatment/Interventions: Self-care/ADL training;Energy conservation;DME and/or AE instruction;Patient/family education;Therapeutic activities;Balance training    OT Goals(Current goals can be found in the care plan section) Acute Rehab OT Goals Patient Stated Goal: to be able to walk and return home OT Goal Formulation: With patient Time For Goal Achievement: 03/04/18 Potential to Achieve Goals: Good ADL Goals Pt Will Perform Grooming: with supervision;standing(3 activities) Pt Will Transfer to Toilet: with supervision;ambulating;regular height toilet Pt Will Perform Toileting - Clothing Manipulation and hygiene: with supervision;sit to/from stand Pt Will Perform Tub/Shower Transfer: Tub transfer;ambulating;tub bench;rolling walker;with supervision Additional ADL Goal #1: Pt will state at least 3 energy conservation strategies and demonstrate pursed lip breathing techniques.  OT Frequency: Min 2X/week   Barriers to D/C:            Co-evaluation              AM-PAC PT "6 Clicks" Daily Activity     Outcome Measure Help from another person eating meals?: None Help from another person taking care of personal grooming?: A Little Help from another person toileting, which includes using toliet, bedpan, or urinal?: A Little Help from another person bathing (including washing, rinsing, drying)?: A Little Help from another person to put on and taking off regular upper body clothing?: None Help from another person to put on and taking off regular lower body clothing?: A Little 6 Click Score: 20    End of Session Equipment Utilized During Treatment: Rolling walker;Oxygen(2L)  Activity Tolerance: Patient limited by fatigue Patient left: in bed;with call bell/phone within reach;with family/visitor present  OT Visit Diagnosis: Unsteadiness on feet (R26.81)                Time: 2876-8115 OT Time Calculation (min): 21 min Charges:  OT General Charges $OT Visit: 1 Visit OT Evaluation $OT Eval Moderate Complexity: 1 Mod G-Codes:     03-03-18 Nestor Lewandowsky, OTR/L Pager: [phone] Werner Lean, [place] 2018/03/03, 2:56 PM

## 2018-02-18 NOTE — Progress Notes (Signed)
Physical Therapy Treatment Patient Details Name: [name] MRN: [id number] DOB: 20-Jun-1941 Today's Date: 02/18/2018    History of Present Illness Patient is a 77 y/o female who presents with SOB, wheezing and cough. Admitted with acute respiratory failure with hypoxia secondary to asthma exacerbation. PMH includes kidney transplant, HTN, MI, DM. CAD, MRSA.    PT Comments    Pt admitted with above diagnosis. Pt currently with functional limitations due to the deficits listed below (see PT Problem List). Pt was able to ambulate with RW with min assist with knee instability and desat with activity.  Will need continued PT.   Pt will benefit from skilled PT to increase their independence and safety with mobility to allow discharge to the venue listed below.    SATURATION QUALIFICATIONS: (This note is used to comply with regulatory documentation for home oxygen)  Patient Saturations on Room Air at Rest = 88%  Patient Saturations on Room Air while Ambulating = 87%  Patient Saturations on 3 Liters of oxygen while Ambulating =90%  Please briefly explain why patient needs home oxygen:Pt required O2 at rest and with ambulation.  Will need home O2.  Follow Up Recommendations  SNF;Supervision for mobility/OOB     Equipment Recommendations  Rolling walker with 5" wheels    Recommendations for Other Services OT consult     Precautions / Restrictions Precautions Precautions: Fall Restrictions Weight Bearing Restrictions: No    Mobility  Bed Mobility Overal bed mobility: Modified Independent             General bed mobility comments: Use of rail and increased time. + dizziness, reports room spinning.  Tested positive for left BPPV therefore treated with canalith repositioning maneuver.    Transfers Overall transfer level: Needs assistance Equipment used: Rolling walker (2 wheeled) Transfers: Sit to/from Stand Sit to Stand: Min assist;Min guard         General transfer  comment: Assist to power to standing and to steady. Reports dizziness slightly less than previously.   Ambulation/Gait Ambulation/Gait assistance: Min assist Ambulation Distance (Feet): 110 Feet Assistive device: Rolling walker (2 wheeled) Gait Pattern/deviations: Step-to pattern;Narrow base of support;Shuffle Gait velocity: decreased Gait velocity interpretation: Below normal speed for age/gender General Gait Details: Pt was able to ambulate a short distance with RW.  Bil knee instability noted at times. Desats without  02.     Stairs            Wheelchair Mobility    Modified Rankin (Stroke Patients Only)       Balance Overall balance assessment: Needs assistance Sitting-balance support: Feet supported;No upper extremity supported Sitting balance-Leahy Scale: Fair Sitting balance - Comments: can sit EOB without assist   Standing balance support: During functional activity;Bilateral upper extremity supported Standing balance-Leahy Scale: Poor Standing balance comment: Requires Min A for standing/dynamic standing balance due to weakness                            Cognition Arousal/Alertness: Awake/alert Behavior During Therapy: WFL for tasks assessed/performed Overall Cognitive Status: Within Functional Limits for tasks assessed                                 General Comments: for basic mobility tasks but not formally assessed.       Exercises      General Comments General comments (skin integrity, edema, etc.): Pt  desat to 87% on RA.  Requires 2LO2 at rest and 3L with activity to keep sats >90%.       Pertinent Vitals/Pain Pain Assessment: No/denies pain    Home Living                      Prior Function            PT Goals (current goals can now be found in the care plan section) Acute Rehab PT Goals Patient Stated Goal: to be able to walk and return home Progress towards PT goals: Progressing toward goals     Frequency    Min 3X/week      PT Plan Current plan remains appropriate    Co-evaluation              AM-PAC PT "6 Clicks" Daily Activity  Outcome Measure  Difficulty turning over in bed (including adjusting bedclothes, sheets and blankets)?: None Difficulty moving from lying on back to sitting on the side of the bed? : None Difficulty sitting down on and standing up from a chair with arms (e.g., wheelchair, bedside commode, etc,.)?: A Little Help needed moving to and from a bed to chair (including a wheelchair)?: A Little Help needed walking in hospital room?: A Little Help needed climbing 3-5 steps with a railing? : A Lot 6 Click Score: 19    End of Session Equipment Utilized During Treatment: Oxygen;Gait belt Activity Tolerance: Patient limited by fatigue Patient left: with call bell/phone within reach;with family/visitor present;in chair Nurse Communication: Mobility status PT Visit Diagnosis: Unsteadiness on feet (R26.81);Difficulty in walking, not elsewhere classified (R26.2);Muscle weakness (generalized) (M62.81)     Time: 0939-1000 PT Time Calculation (min) (ACUTE ONLY): 21 min  Charges:  $Gait Training: 8-22 mins                    G Codes:       [name],PT Acute Rehabilitation [phone] (pager)    [name] 02/18/2018, 11:43 AM

## 2018-02-18 NOTE — Care Management Important Message (Signed)
Important Message  Patient Details  Name: [name] MRN: [id number] Date of Birth: July 03, 1941   Medicare Important Message Given:  Yes    Orbie Pyo 02/18/2018, 2:58 PM

## 2018-02-18 NOTE — Progress Notes (Signed)
CSW spoke with pt regarding PT recommendation for SNF.  Patient very against idea of SNF having been in the past.  States she lives with her grandson who helps her with cooking and running errands already.  Grandon there in the evenings and pt reports he is getting his girlfriend to help during the daytime as well as her son.  Patient already had walker/potty chair at home so unsure of additional equipment needs.  Patient agreeable to home health services- has had in the past but can't remember who provided those services.  CSW informed RNCM of pt preference to return home with family support  CSW signing off  Jorge Ny, [place] Social Worker [phone]

## 2018-02-18 NOTE — Progress Notes (Signed)
SATURATION QUALIFICATIONS: (This note is used to comply with regulatory documentation for home oxygen)  Patient Saturations on Room Air at Rest = 88%  Patient Saturations on Room Air while Ambulating = 87%  Patient Saturations on 3 Liters of oxygen while Ambulating =90%  Please briefly explain why patient needs home oxygen:Pt required O2 at rest and with ambulation.  Will need home O2. Thanks.  [name] [phone] (pager)

## 2018-02-18 NOTE — Care Management Note (Signed)
Case Management Note  Patient Details  Name: [name] MRN: [id number] Date of Birth: 09/26/1941  Subjective/Objective:   Asthma exacerbation                 Action/Plan: Spoke to pt and she lives at home with grandson, [name]. States she has RW, bedside commode, and neb machine at home. Pt will need oxygen for home. Offered choice for HH/list provided. Pt agreeable to [facility] for HH. Contacted [facility] with new referral. She has declined SNF. Will need HH PT/OT, and aide order with F2F. And oxygen orders. NCM spoke to attending and plan is for dc in 1-2 days. Will need an updated walking oxygen sat prior to dc to qualify for oxygen.   Expected Discharge Date:                  Expected Discharge Plan:  [place]  In-House Referral:  Clinical Social Work  Discharge planning Services  CM Consult  Post Acute Care Choice:  Home Health Choice offered to:  Patient  DME Arranged:  Oxygen DME Agency:  [facility] Arranged:  PT, OT, Nurse's Aide, Refused SNF [facility] Agency:  [place]  Status of Service:  In process, will continue to follow  If discussed at Long Length of Stay Meetings, dates discussed:    Additional Comments:  Erenest Rasher, RN 02/18/2018, 3:03 PM

## 2018-02-19 LAB — BASIC METABOLIC PANEL
Anion gap: 9 (ref 5–15)
BUN: 32 mg/dL — AB (ref 6–20)
CO2: 26 mmol/L (ref 22–32)
CREATININE: 1.2 mg/dL — AB (ref 0.44–1.00)
Calcium: 8.7 mg/dL — ABNORMAL LOW (ref 8.9–10.3)
Chloride: 100 mmol/L — ABNORMAL LOW (ref 101–111)
GFR, EST AFRICAN AMERICAN: 50 mL/min — AB (ref >60)
GFR, EST NON AFRICAN AMERICAN: 43 mL/min — AB (ref >60)
Glucose, Bld: 108 mg/dL — ABNORMAL HIGH (ref 65–99)
Potassium: 4.5 mmol/L (ref 3.5–5.1)
SODIUM: 135 mmol/L (ref 135–145)

## 2018-02-19 LAB — GLUCOSE, CAPILLARY
GLUCOSE-CAPILLARY: 122 mg/dL — AB (ref 65–99)
GLUCOSE-CAPILLARY: 109 mg/dL — AB (ref 65–99)
Glucose-Capillary: 138 mg/dL — ABNORMAL HIGH (ref 65–99)
Glucose-Capillary: 141 mg/dL — ABNORMAL HIGH (ref 65–99)
Glucose-Capillary: 102 mg/dL — ABNORMAL HIGH (ref 65–99)

## 2018-02-19 MED ORDER — PREDNISONE 20 MG PO TABS
20.0000 mg | ORAL_TABLET | Freq: Two times a day (BID) | ORAL | Status: DC
  Administered 2018-02-19 – 2018-02-21 (×5): 20 mg via ORAL
  Filled 2018-02-19 (×5): qty 1

## 2018-02-19 MED ORDER — INSULIN GLARGINE 100 UNIT/ML ~~LOC~~ SOLN
8.0000 [IU] | Freq: Every day | SUBCUTANEOUS | Status: DC
Start: 2018-02-20 — End: 2018-02-21
  Administered 2018-02-20 – 2018-02-21 (×2): 8 [IU] via SUBCUTANEOUS
  Filled 2018-02-19 (×2): qty 0.08

## 2018-02-19 MED ORDER — TRIAMTERENE-HCTZ 37.5-25 MG PO TABS
1.0000 | ORAL_TABLET | Freq: Every day | ORAL | Status: DC
  Administered 2018-02-19 – 2018-02-21 (×3): 1 via ORAL
  Filled 2018-02-19 (×3): qty 1

## 2018-02-19 NOTE — Progress Notes (Signed)
[name]  [name]  GQQ:[id number] DOB: 1941-04-11 DOA: 02/12/2018 PCP: Renato Shin, MD    Brief Narrative:  77 y.o. female w/ a history of chronic asthma, CAD, DM2, GERD, Gout, and ESRD s/p renal transplant September 2012 on tacrolimus and chronic steroids who presented with severe SOB.  Subjective: Pt feels she is steadily getting better, but is not yet back to her baseline.  She continues to wheeze, though it is not as severe.  She denies cp, n/v, abdom pain, or HA.    Assessment & Plan:  Acute hypoxic respiratory failure - Severe persistent asthma with acute exacerbation CTa chest negative for PE - wean to oral steroids - cont nebs - hopeful for d/c in ~48hrs - reassess O2 requirement just prior to d/c home as she is borderline presently   Acute kidney injury on chronic kidney disease status post renal transplant baseline creatinine 1.2 - has now returned to baseline   Recent Labs  Lab 02/15/18 0258 02/16/18 0312 02/17/18 0243 02/18/18 0249 02/19/18 0225  CREATININE 1.69* 1.50* 1.31* 1.32* 1.20*    Chronic nonallergic rhinitis Continue nasal spray - improved   Essential hypertension BP not at goal - adjust tx and follow   Hyperlipidemia Continue Crestor  GERD Continue PPI  Diabetes mellitus type 2 A1c 5.7 - wean steroids asap - CBG controlled at this time   CAD Cont med tx   DVT prophylaxis: lovenox  Code Status: FULL CODE Family Communication: spoke w/ son at bedside  Disposition Plan: SDU  Consultants:  none  Antimicrobials:  none  Objective: Blood pressure (!) 152/81, pulse 90, temperature 98.3 F (36.8 C), temperature source Oral, resp. rate 18, height 5\' 2"  (1.575 m), weight 73 kg (160 lb 15 oz), SpO2 90 %.  Intake/Output Summary (Last 24 hours) at 02/19/2018 1328 Last data filed at 02/19/2018 0730 Gross per 24 hour  Intake 240 ml  Output 1025 ml  Net -785 ml   Filed Weights   02/15/18 0027 02/15/18  0500 02/16/18 0333  Weight: 73 kg (160 lb 15 oz) 72.8 kg (160 lb 7.9 oz) 73 kg (160 lb 15 oz)    Examination: General: No acute respiratory distress at rest in chair  Lungs: diffuse mild wheezing - no prolonged exp phase Cardiovascular: RRR - no M  Abdomen: NT/ND, soft, bowel sounds positive, no rebound, no ascites, no appreciable mass Extremities: trace edema B LE w/o change   CBC: Recent Labs  Lab 02/15/18 0258 02/16/18 0312 02/18/18 0249  WBC 18.6* 13.9* 9.8  HGB 13.1 12.4 11.8*  HCT 37.6 36.3 34.0*  MCV 72.6* 73.3* 73.3*  PLT 153 142* 671*   Basic Metabolic Panel: Recent Labs  Lab 02/14/18 0223 02/15/18 0258 02/16/18 0312 02/17/18 0243 02/18/18 0249 02/19/18 0225  NA 136 135 134* 133* 134* 135  K 4.6 4.5 5.2* 5.2* 5.2* 4.5  CL 100* 100* 100* 98* 100* 100*  CO2 20* 23 24 25 26 26   GLUCOSE 256* 189* 169* 180* 157* 108*  BUN 46* 49* 44* 33* 31* 32*  CREATININE 2.17* 1.69* 1.50* 1.31* 1.32* 1.20*  CALCIUM 9.5 9.3 8.9 9.1 8.6* 8.7*  MG 2.9* 2.9* 2.7*  --   --   --    GFR: Estimated Creatinine Clearance: 37.9 mL/min (A) (by C-G formula based on SCr of 1.2 mg/dL (H)).   Cardiac Enzymes: Recent Labs  Lab 02/13/18 0914 02/13/18 1422 02/13/18 2155  TROPONINI 0.09* 0.12* 0.14*  HbA1C: Hgb A1c MFr Bld  Date/Time Value Ref Range Status  02/13/2018 03:05 AM 5.7 (H) 4.8 - 5.6 % Final    Comment:    (NOTE) Pre diabetes:          5.7%-6.4% Diabetes:              >6.4% Glycemic control for   <7.0% adults with diabetes   02/28/2013 02:56 PM 5.5 4.6 - 6.5 % Final    Comment:    Glycemic Control Guidelines for People with Diabetes:Non Diabetic:  <6%Goal of Therapy: <7%Additional Action Suggested:  >8%     CBG: Recent Labs  Lab 02/18/18 1648 02/18/18 2039 02/19/18 0617 02/19/18 0741 02/19/18 1140  GLUCAP 209* 146* 122* 109* 138*    Recent Results (from the past 240 hour(s))  MRSA PCR Screening     Status: None   Collection Time: 02/12/18  8:29 PM    Result Value Ref Range Status   MRSA by PCR NEGATIVE NEGATIVE Final    Comment:        The GeneXpert MRSA Assay (FDA approved for NASAL specimens only), is one component of a comprehensive MRSA colonization surveillance program. It is not intended to diagnose MRSA infection nor to guide or monitor treatment for MRSA infections. Performed at [facility], [place] [address] [place], [place] [address]      Scheduled Meds: . amLODipine  10 mg Oral q morning - 10a  . aspirin  81 mg Oral Daily  . azaTHIOprine  50 mg Oral Daily  . azelastine  1 spray Each Nare BID  . calcitRIOL  0.25 mcg Oral Daily  . enoxaparin (LOVENOX) injection  30 mg Subcutaneous Q24H  . fluticasone  1 spray Each Nare BID  . guaiFENesin  1,200 mg Oral BID  . hydrALAZINE  5 mg Intravenous Q6H  . insulin aspart  0-20 Units Subcutaneous TID WC  . insulin aspart  0-5 Units Subcutaneous QHS  . insulin glargine  12 Units Subcutaneous Daily  . ipratropium-albuterol  3 mL Nebulization TID  . mouth rinse  15 mL Mouth Rinse BID  . methylPREDNISolone (SOLU-MEDROL) injection  40 mg Intravenous Q12H  . pantoprazole  40 mg Oral Daily  . rosuvastatin  10 mg Oral QHS  . tacrolimus  5 mg Oral Q12H     LOS: 6 days   Cherene Altes, MD [facility] Office  [phone] Pager - Text Page per Amion as per below:  On-Call/Text Page:      [email or web address]      password [email or web address]  If 7PM-7AM, please contact night-coverage [email or web address] Password [email or web address] 02/19/2018, 1:28 PM

## 2018-02-19 NOTE — Progress Notes (Signed)
RT NOTE:  Pt is unable to preform Peak Flow with breathing treatments. Pt gives poor effort.

## 2018-02-19 NOTE — Progress Notes (Signed)
SATURATION QUALIFICATIONS: (This note is used to comply with regulatory documentation for home oxygen)  Patient Saturations on Room Air at Rest = 93%  Patient Saturations on Room Air while Ambulating = 87%  Patient Saturations on 2 Liters of oxygen while Ambulating = 94%  Please briefly explain why patient needs home oxygen: Pt desats to 87 when ambulating on RA and requires 2L of O2 to keep O2 above 90 during functional activity and ambulation.   [facility], OT/L  [phone] 02/19/2018

## 2018-02-19 NOTE — Progress Notes (Signed)
Occupational Therapy Treatment Patient Details Name: TEKIA WATERBURY MRN: [id number] DOB: [birth year] Today's Date: 02/19/2018    History of present illness Patient is a 77 y/o female who presents with SOB, wheezing and cough. Admitted with acute respiratory failure with hypoxia secondary to asthma exacerbation. PMH includes kidney transplant, HTN, MI, DM. CAD, MRSA.   OT comments  Pt making progress. Pt fatigues easily but able to complete basic self care and ambulate @ 75 ft with 3 rest breaks on 2L with O2 @ 94. Educated on pursed lip breathing. Pt desats to 87 on RA with 2/4 dyspnea. Educated on energy conservation strategies. Pt overall set up for ADL @ RW level. Pt will need a tub bench for safe DC home. Pt would also benefit from use of rollator. See previous note for O2 sats. HR 99-111 during session. Will continue to follow acutely to facilitate safe DC home.  Follow Up Recommendations  Home health OT;Supervision - Intermittent    Equipment Recommendations  Tub/shower bench    Recommendations for Other Services      Precautions / Restrictions Precautions Precautions: Fall Precaution Comments: watch 02       Mobility Bed Mobility Overal bed mobility: Modified Independent                Transfers Overall transfer level: Needs assistance Equipment used: Rolling walker (2 wheeled) Transfers: Sit to/from Stand Sit to Stand: Supervision         General transfer comment: vc for hand placement    Balance     Sitting balance-Leahy Scale: Good       Standing balance-Leahy Scale: Fair                             ADL either performed or assessed with clinical judgement   ADL Overall ADL's : Needs assistance/impaired                                     Functional mobility during ADLs: Supervision/safety;Rolling walker General ADL Comments: Educated pt on energy conservation strtegies; hand out provided; Pt easily fatigues with ADL  tasks; Desats to 87 with activity on RA; Discussed possible use of rollator for coomunity - pt verbalized understanding.     Vision       Perception     Praxis      Cognition Arousal/Alertness: Awake/alert Behavior During Therapy: WFL for tasks assessed/performed Overall Cognitive Status: Within Functional Limits for tasks assessed                                          Exercises Exercises: Other exercises Other Exercises Other Exercises: pursed lip breathing education   Shoulder Instructions       General Comments      Pertinent Vitals/ Pain       Pain Assessment: No/denies pain  Home Living                                          Prior Functioning/Environment              Frequency  Min 2X/week        Progress Toward  Goals  OT Goals(current goals can now be found in the care plan section)  Progress towards OT goals: Progressing toward goals  Acute Rehab OT Goals Patient Stated Goal: to be able to walk and return home OT Goal Formulation: With patient Time For Goal Achievement: 03/04/18 Potential to Achieve Goals: Good ADL Goals Pt Will Perform Grooming: with supervision;standing Pt Will Transfer to Toilet: with supervision;ambulating;regular height toilet Pt Will Perform Toileting - Clothing Manipulation and hygiene: with supervision;sit to/from stand Pt Will Perform Tub/Shower Transfer: Tub transfer;ambulating;tub bench;rolling walker;with supervision Additional ADL Goal #1: Pt will state at least 3 energy conservation strategies and demonstrate pursed lip breathing techniques.  Plan Discharge plan remains appropriate    Co-evaluation                 AM-PAC PT "6 Clicks" Daily Activity     Outcome Measure   Help from another person eating meals?: None Help from another person taking care of personal grooming?: None Help from another person toileting, which includes using toliet, bedpan, or  urinal?: A Little Help from another person bathing (including washing, rinsing, drying)?: A Little Help from another person to put on and taking off regular upper body clothing?: None Help from another person to put on and taking off regular lower body clothing?: A Little 6 Click Score: 21    End of Session Equipment Utilized During Treatment: Rolling walker;Oxygen;Gait belt(2L)  OT Visit Diagnosis: Unsteadiness on feet (R26.81);Muscle weakness (generalized) (M62.81)   Activity Tolerance Patient tolerated treatment well   Patient Left in bed;with call bell/phone within reach   Nurse Communication Mobility status        Time: 1638-4536 OT Time Calculation (min): 25 min  Charges: OT General Charges $OT Visit: 1 Visit OT Treatments $Self Care/Home Management : 23-37 mins  [facility], OT/L  [phone] 02/19/2018   Layton Naves,HILLARY 02/19/2018, 12:00 PM

## 2018-02-20 LAB — BASIC METABOLIC PANEL WITH GFR
Anion gap: 13 (ref 5–15)
BUN: 32 mg/dL — ABNORMAL HIGH (ref 6–20)
CO2: 24 mmol/L (ref 22–32)
Calcium: 8.6 mg/dL — ABNORMAL LOW (ref 8.9–10.3)
Chloride: 96 mmol/L — ABNORMAL LOW (ref 101–111)
Creatinine, Ser: 1.17 mg/dL — ABNORMAL HIGH (ref 0.44–1.00)
GFR calc Af Amer: 51 mL/min — ABNORMAL LOW
GFR calc non Af Amer: 44 mL/min — ABNORMAL LOW
Glucose, Bld: 126 mg/dL — ABNORMAL HIGH (ref 65–99)
Potassium: 4.5 mmol/L (ref 3.5–5.1)
Sodium: 133 mmol/L — ABNORMAL LOW (ref 135–145)

## 2018-02-20 LAB — RETICULOCYTES
RBC.: 4.9 MIL/uL (ref 3.87–5.11)
RETIC COUNT ABSOLUTE: 34.3 10*3/uL (ref 19.0–186.0)
Retic Ct Pct: 0.7 % (ref 0.4–3.1)

## 2018-02-20 LAB — GLUCOSE, CAPILLARY
GLUCOSE-CAPILLARY: 116 mg/dL — AB (ref 65–99)
Glucose-Capillary: 109 mg/dL — ABNORMAL HIGH (ref 65–99)
Glucose-Capillary: 151 mg/dL — ABNORMAL HIGH (ref 65–99)
Glucose-Capillary: 146 mg/dL — ABNORMAL HIGH (ref 65–99)
Glucose-Capillary: 138 mg/dL — ABNORMAL HIGH (ref 65–99)

## 2018-02-20 LAB — FOLATE: Folate: 11.5 ng/mL (ref >5.9)

## 2018-02-20 LAB — IRON AND TIBC
Iron: 92 ug/dL (ref 28–170)
SATURATION RATIOS: 33 % — AB (ref 10.4–31.8)
TIBC: 279 ug/dL (ref 250–450)
UIBC: 187 ug/dL

## 2018-02-20 LAB — VITAMIN B12: Vitamin B-12: 2537 pg/mL — ABNORMAL HIGH (ref 180–914)

## 2018-02-20 LAB — FERRITIN: FERRITIN: 128 ng/mL (ref 11–307)

## 2018-02-20 MED ORDER — HYDRALAZINE HCL 10 MG PO TABS
10.0000 mg | ORAL_TABLET | Freq: Four times a day (QID) | ORAL | Status: DC
  Administered 2018-02-20 – 2018-02-21 (×4): 10 mg via ORAL
  Filled 2018-02-20 (×5): qty 1

## 2018-02-20 NOTE — Progress Notes (Signed)
PROGRESS NOTE    [name]  KZS:[id number] DOB: 04-Aug-1941 DOA: 02/12/2018 PCP: Renato Shin, MD   Brief Narrative:  77 y.o. BF PMHx Chronic Asthma, CAD S/P cardiac catheterization in January 2009, S/P  MI in 2011, Diabetes Type 2 , ESRD S/P HD now S/P Renal Transplant since September 2012:, on tacrolimus and chronic steroid followed at [facility], GERD, Gout, HTN, HLD, Hyperparathyroidism,   Presenting today with severe shortness of breath.  Patient has chronic issues with her asthma, usually able to treated with nebulizers at home, however she failed those treatments, feeling that this was getting worse.  She also reported dry cough.  She denies any fever or chills. Denies sick contacts, She denies any swelling in the lower extremities.  She reports substernal in the lower portion of her chest some tightness with deep inspiration, she denies any palpitations.  The patient denies any prior hospitalization or ICU admission regarding her breathing issues.     ED Course:  BP (!) 133/57   Pulse (!) 115   Temp 97.9 F (36.6 C) (Oral)   Resp 19   Ht 5' 2.5" (1.588 m)   Wt 73.9 kg (163 lb)   SpO2 99%   BMI 29.34 kg/m   At the ED, the patient received 3 breathing treatments, with continuous nebulizer, and Solu-Medrol 125 mg x1, however she did not show much improvement. She was placed on BiPAP, with some respiratory comfort. He also received a dose of magnesium. Glucose 95 Creatinine 1.21, stable GFR 49 BNP 91.5 Troponin 0 White count 8.3, hemoglobin 13.4, platelets 137 (likely related to tacrolimus) Chest x-ray showed edema or consolidation    Subjective: 4/3 A/O x4, negative CP positive as OB but significantly improved now on 1 L O2 via [place].  States was able to ambulate to nurses station and back: Negative lightheadedness, mild increased DOE.   Assessment & Plan:   Principal Problem:   Severe persistent asthma with acute exacerbation Active Problems:   GOUT   THROMBOCYTOPENIA   Secondary renal hyperparathyroidism (HCC)   Anemia   Coronary artery disease   Hyperlipidemia   Hypertension   Aspirin allergy   Hx of CABG   S/P kidney transplant   H/O steroid therapy   Diabetes (HCC)   GERD (gastroesophageal reflux disease)   Severe persistent asthma with (acute) exacerbation  Acute respiratory failure with hypoxia/asthma exacerbation Patient receivednebs, Magnesium and Soumedrol 125 mg IV x1 treatment in ED. CXR w/o evidence of acute process.patient was placed on BiPAP, as she continues to be short of breath despite treatments.Afebrile. WBC normal  -Prednisone 20 mg BID -DuoNeb TID -Hold antibiotics at this time. -CT angiogram negative for PE -Echocardiogram: No significant change from previous echocardiogram.  See results below  Lung nodule -Not pathologic by size see CT angiogram below. -Low risk patient, no further workup required per Fleischner Society 2017 guidelines.  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 Michel Bickers ID.  Concern is that patient immunocompromised.  Per ID no need to start empiric antibiotics unless patient begins to exhibit signs and symptoms consistent with meningitis.  No need to place patient in isolation room unless patient begins to ask variance signs and symptoms consistent with meningitis -Have permission to contact Janalee Grobe (sister) [phone] in order to obtain current medical records on her treatment  for her meningitis -Patient's laboratory status significantly improved never developed signs of meningitis.   Chronic nonallergic rhinitis and conjunctivitis,  -The patient will continue on nasal sprays     History of ESRD S/P Renal transplant,  (baseline Cr 1.21) -Previous HD patient  - Azathioprine 50 mg daily - Tacrolimus 5 mg  BID -Prednisone 20 mg BID Recent Labs  Lab 02/14/18 0223 02/15/18 0258 02/16/18 0312 02/17/18 0243 02/18/18 0249 02/19/18 0225 02/20/18 0401  CREATININE 2.17* 1.69* 1.50* 1.31* 1.32* 1.20* 1.17*  -At baseline -Continue to hold all nephrotoxic agents   Thrombocytopenia,  -Mild thrombocytopenia in presence chronic tacrolimus: currently at 138,000 -No signs of overt bleeding - Monitor closely, stable  Anemia unspecified  - Anemia panel pending - Occult blood pending -   Essential Hypertension   -Amlodipine 10 mg daily -4/3 change hydralazine IV to hydralazine 10 mg QID  Hyperlipidemia -3/28 increase Crestor 10 mg daily -Lipid panel not within ADA guidelines -LDL goal<70   GERD,  -No acute symptoms -Protonix 40 mg daily   Type II Diabetes controlled with complication   -4/78 Hemoglobin A1c = 5.7  -Lantus 8 units daily: Secondary to high-dose steroids -Resistant SSI   CAD,   -EKG NSR ,  Tn 0   last cardiac catheterization 2011 p MI , . Continue ASA,  high dose statin, and Norvasc    DVT prophylaxis: Lovenox Code Status: Full Family Communication: None Disposition Plan: TBD   Consultants:  None  Procedures/Significant Events:  3/26 CT angiogram chest PE protocol:-Negative PE  - Solitary new solid 4 mm right middle lobe pulmonary nodule.  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 - Evidence of bronchomalacia, particularly in the central right airways. -Dilated main pulmonary artery, suggesting pulmonary arterial hypertension. 3/29 echocardiogram:Left ventricle:-Moderate LVH.-LVEF = 60% to 29% -Grade 1 diastolic dysfunction.  - Atrial septum: A patent foramen ovale cannot be excluded.      I have personally reviewed and interpreted all radiology studies and my findings are as above.  VENTILATOR SETTINGS:    Cultures   Antimicrobials: Anti-infectives (From admission, onward)   None        Devices    LINES / TUBES:      Continuous Infusions:   Objective: Vitals:   02/20/18 0000 02/20/18 0250 02/20/18 0411 02/20/18 0632  BP:  135/69 (!) 169/81 (!) 161/78  Pulse: 97  92   Resp: 18  (!) 21   Temp:   99.3 F (37.4 C)   TempSrc:   Oral   SpO2: 99%  96%   Weight:      Height:       No intake or output data in the 24 hours ending 02/20/18 0737 Filed Weights   02/15/18 0027 02/15/18 0500 02/16/18 0333  Weight: 160 lb 15 oz (73 kg) 160 lb 7.9 oz (72.8 kg) 160 lb 15 oz (73 kg)    Physical Exam:  General: A/O x4, positive acute respiratory distress (significant improved) Neck:  Negative scars, masses, torticollis, lymphadenopathy, JVD Lungs: Clear to auscultation bilaterally without wheezes or crackles Cardiovascular: Regular rate and rhythm without murmur gallop or rub normal S1 and S2 Abdomen: negative abdominal pain, nondistended, positive soft, bowel sounds, no rebound, no ascites, no appreciable mass Extremities: No significant cyanosis, clubbing, or edema bilateral lower extremities Skin: Negative rashes, lesions, ulcers Psychiatric:  Negative depression, negative anxiety, negative fatigue, negative mania  Central nervous system:  Cranial nerves II through XII intact, tongue/uvula  midline, all extremities muscle strength 5/5, sensation intact throughout,  negative dysarthria, negative expressive aphasia, negative receptive aphasia.       Data Reviewed: Care during the described time interval was provided by me .  I have reviewed this patient's available data, including medical history, events of note, physical examination, and all test results as part of my evaluation.   CBC: Recent Labs  Lab 02/14/18 0223 02/15/18 0258 02/16/18 0312 02/18/18 0249  WBC 21.7* 18.6* 13.9* 9.8  HGB 13.7 13.1 12.4 11.8*  HCT 38.6 37.6 36.3 34.0*  MCV 74.5* 72.6* 73.3* 73.3*  PLT 145* 153 142* 270*   Basic Metabolic Panel: Recent Labs  Lab 02/14/18 0223  02/15/18 0258 02/16/18 0312 02/17/18 0243 02/18/18 0249 02/19/18 0225 02/20/18 0401  NA 136 135 134* 133* 134* 135 133*  K 4.6 4.5 5.2* 5.2* 5.2* 4.5 4.5  CL 100* 100* 100* 98* 100* 100* 96*  CO2 20* 23 24 25 26 26 24   GLUCOSE 256* 189* 169* 180* 157* 108* 126*  BUN 46* 49* 44* 33* 31* 32* 32*  CREATININE 2.17* 1.69* 1.50* 1.31* 1.32* 1.20* 1.17*  CALCIUM 9.5 9.3 8.9 9.1 8.6* 8.7* 8.6*  MG 2.9* 2.9* 2.7*  --   --   --   --    GFR: Estimated Creatinine Clearance: 38.9 mL/min (A) (by C-G formula based on SCr of 1.17 mg/dL (H)). Liver Function Tests: No results for input(s): AST, ALT, ALKPHOS, BILITOT, PROT, ALBUMIN in the last 168 hours. No results for input(s): LIPASE, AMYLASE in the last 168 hours. No results for input(s): AMMONIA in the last 168 hours. Coagulation Profile: No results for input(s): INR, PROTIME in the last 168 hours. Cardiac Enzymes: Recent Labs  Lab 02/13/18 0914 02/13/18 1422 02/13/18 2155  TROPONINI 0.09* 0.12* 0.14*   BNP (last 3 results) No results for input(s): PROBNP in the last 8760 hours. HbA1C: No results for input(s): HGBA1C in the last 72 hours. CBG: Recent Labs  Lab 02/19/18 1140 02/19/18 1642 02/19/18 2035 02/20/18 0631 02/20/18 0716  GLUCAP 138* 141* 102* 116* 109*   Lipid Profile: No results for input(s): CHOL, HDL, LDLCALC, TRIG, CHOLHDL, LDLDIRECT in the last 72 hours. Thyroid Function Tests: No results for input(s): TSH, T4TOTAL, FREET4, T3FREE, THYROIDAB in the last 72 hours. Anemia Panel: No results for input(s): VITAMINB12, FOLATE, FERRITIN, TIBC, IRON, RETICCTPCT in the last 72 hours. Urine analysis:    Component Value Date/Time   COLORURINE YELLOW 02/12/2018 2129   APPEARANCEUR CLEAR 02/12/2018 2129   LABSPEC >1.046 (H) 02/12/2018 2129   PHURINE 5.0 02/12/2018 2129   GLUCOSEU NEGATIVE 02/12/2018 2129   GLUCOSEU NEGATIVE 06/21/2012 0819   HGBUR NEGATIVE 02/12/2018 2129   [place] NEGATIVE 02/12/2018 2129    KETONESUR 5 (A) 02/12/2018 2129   PROTEINUR 100 (A) 02/12/2018 2129   UROBILINOGEN 0.2 12/10/2012 1056   NITRITE NEGATIVE 02/12/2018 2129   LEUKOCYTESUR NEGATIVE 02/12/2018 2129   Sepsis Labs: @LABRCNTIP (procalcitonin:4,lacticidven:4)  ) Recent Results (from the past 240 hour(s))  MRSA PCR Screening     Status: None   Collection Time: 02/12/18  8:29 PM  Result Value Ref Range Status   MRSA by PCR NEGATIVE NEGATIVE Final    Comment:        The GeneXpert MRSA Assay (FDA approved for NASAL specimens only), is one component of a comprehensive MRSA colonization surveillance program. It is not intended to diagnose MRSA infection nor to guide or monitor treatment for MRSA infections. Performed at [facility],  [address] [place], [place] [address]          Radiology Studies: No results found.      Scheduled Meds: . amLODipine  10 mg Oral q morning - 10a  . aspirin  81 mg Oral Daily  . azaTHIOprine  50 mg Oral Daily  . azelastine  1 spray Each Nare BID  . calcitRIOL  0.25 mcg Oral Daily  . enoxaparin (LOVENOX) injection  30 mg Subcutaneous Q24H  . fluticasone  1 spray Each Nare BID  . guaiFENesin  1,200 mg Oral BID  . hydrALAZINE  5 mg Intravenous Q6H  . insulin aspart  0-20 Units Subcutaneous TID WC  . insulin aspart  0-5 Units Subcutaneous QHS  . insulin glargine  8 Units Subcutaneous Daily  . ipratropium-albuterol  3 mL Nebulization TID  . mouth rinse  15 mL Mouth Rinse BID  . pantoprazole  40 mg Oral Daily  . predniSONE  20 mg Oral BID WC  . rosuvastatin  10 mg Oral QHS  . tacrolimus  5 mg Oral Q12H  . triamterene-hydrochlorothiazide  1 tablet Oral Daily   Continuous Infusions:   LOS: 7 days    Time spent: 40 minutes    Zakyria Metzinger, Geraldo Docker, MD [facility] Pager [phone]   If 7PM-7AM, please contact night-coverage [email or web address] Password [facility] 02/20/2018, 7:37 AM

## 2018-02-20 NOTE — Progress Notes (Signed)
Physical Therapy Treatment Patient Details Name: [name] MRN: [id number] DOB: June 10, 1941 Today's Date: 02/20/2018    History of Present Illness Patient is a 77 y/o female who presents with SOB, wheezing and cough. Admitted with acute respiratory failure with hypoxia secondary to asthma exacerbation. PMH includes kidney transplant, HTN, MI, DM. CAD, MRSA.    PT Comments    Pt progressing well towards functional mobility goals. She demonstrated bed mobility mod I, transfers supervision, and ambulation min guard with RW and 1 standing rest break. SpO2 92-95% on 1L O2 during ambulation. No complaints of dizziness this session. Pt refusing SNF, recommending HHPT and supervision for mobility. Will continue to follow acutely and progress as tolerated.   Follow Up Recommendations  Home health PT;Supervision for mobility/OOB     Equipment Recommendations  Rolling walker with 5" wheels    Recommendations for Other Services       Precautions / Restrictions Precautions Precautions: Fall Precaution Comments: watch 02 Restrictions Weight Bearing Restrictions: No    Mobility  Bed Mobility Overal bed mobility: Modified Independent             General bed mobility comments: used rail, denies dizziness  Transfers Overall transfer level: Needs assistance Equipment used: Rolling walker (2 wheeled) Transfers: Sit to/from Stand Sit to Stand: Supervision         General transfer comment: VCs for hand placement  Ambulation/Gait Ambulation/Gait assistance: Min guard Ambulation Distance (Feet): 220 Feet(110 ft x2) Assistive device: Rolling walker (2 wheeled) Gait Pattern/deviations: Step-to pattern;Shuffle Gait velocity: decreased Gait velocity interpretation: Below normal speed for age/gender General Gait Details: Pt with slow gait. she was able to ambulate min guard for balance and safety. mild bil knee instability with no LOB noted. Pt fatigued after ~110 ft then required  brief standing rest break before walking another ~110 ft   Stairs            Wheelchair Mobility    Modified Rankin (Stroke Patients Only)       Balance Overall balance assessment: Needs assistance Sitting-balance support: Feet supported;No upper extremity supported Sitting balance-Leahy Scale: Good     Standing balance support: During functional activity;Bilateral upper extremity supported Standing balance-Leahy Scale: Fair Standing balance comment: can perform static standing without UE support, requires RW for balance and energy efficiency during ambulation                            Cognition Arousal/Alertness: Awake/alert Behavior During Therapy: WFL for tasks assessed/performed Overall Cognitive Status: Within Functional Limits for tasks assessed                                        Exercises      General Comments General comments (skin integrity, edema, etc.): SpO2 92-95% on 1L      Pertinent Vitals/Pain Pain Assessment: No/denies pain    Home Living                      Prior Function            PT Goals (current goals can now be found in the care plan section) Acute Rehab PT Goals Patient Stated Goal: to be able to walk and return home PT Goal Formulation: With patient Time For Goal Achievement: 03/03/18 Potential to Achieve Goals: Good Progress towards PT goals:  Progressing toward goals    Frequency    Min 3X/week      PT Plan Discharge plan needs to be updated    Co-evaluation              AM-PAC PT "6 Clicks" Daily Activity  Outcome Measure  Difficulty turning over in bed (including adjusting bedclothes, sheets and blankets)?: None Difficulty moving from lying on back to sitting on the side of the bed? : None Difficulty sitting down on and standing up from a chair with arms (e.g., wheelchair, bedside commode, etc,.)?: None Help needed moving to and from a bed to chair (including a  wheelchair)?: A Little Help needed walking in hospital room?: A Little Help needed climbing 3-5 steps with a railing? : A Little 6 Click Score: 21    End of Session Equipment Utilized During Treatment: Oxygen;Gait belt Activity Tolerance: Patient tolerated treatment well;Patient limited by fatigue Patient left: in chair;with call bell/phone within reach Nurse Communication: Mobility status PT Visit Diagnosis: Unsteadiness on feet (R26.81);Difficulty in walking, not elsewhere classified (R26.2);Muscle weakness (generalized) (M62.81)     Time: 9390-3009 PT Time Calculation (min) (ACUTE ONLY): 14 min  Charges:  $Gait Training: 8-22 mins                    G Codes:       Vic Ripper, SPT   Vic Ripper 02/20/2018, 12:24 PM

## 2018-02-21 DIAGNOSIS — N2581 Secondary hyperparathyroidism of renal origin: Secondary | ICD-10-CM

## 2018-02-21 LAB — CBC
HCT: 36.6 % (ref 36.0–46.0)
HEMOGLOBIN: 12.7 g/dL (ref 12.0–15.0)
MCH: 25.2 pg — ABNORMAL LOW (ref 26.0–34.0)
MCHC: 34.7 g/dL (ref 30.0–36.0)
MCV: 72.6 fL — ABNORMAL LOW (ref 78.0–100.0)
Platelets: 143 10*3/uL — ABNORMAL LOW (ref 150–400)
RBC: 5.04 MIL/uL (ref 3.87–5.11)
RDW: 16.8 % — ABNORMAL HIGH (ref 11.5–15.5)
WBC: 11.6 10*3/uL — ABNORMAL HIGH (ref 4.0–10.5)

## 2018-02-21 LAB — MAGNESIUM: MAGNESIUM: 2.5 mg/dL — AB (ref 1.7–2.4)

## 2018-02-21 LAB — GLUCOSE, CAPILLARY
GLUCOSE-CAPILLARY: 119 mg/dL — AB (ref 65–99)
GLUCOSE-CAPILLARY: 123 mg/dL — AB (ref 65–99)
GLUCOSE-CAPILLARY: 153 mg/dL — AB (ref 65–99)
Glucose-Capillary: 170 mg/dL — ABNORMAL HIGH (ref 65–99)

## 2018-02-21 LAB — BASIC METABOLIC PANEL
Anion gap: 12 (ref 5–15)
BUN: 31 mg/dL — AB (ref 6–20)
CO2: 24 mmol/L (ref 22–32)
Calcium: 9.1 mg/dL (ref 8.9–10.3)
Chloride: 96 mmol/L — ABNORMAL LOW (ref 101–111)
Creatinine, Ser: 1.33 mg/dL — ABNORMAL HIGH (ref 0.44–1.00)
GFR, EST AFRICAN AMERICAN: 44 mL/min — AB (ref >60)
GFR, EST NON AFRICAN AMERICAN: 38 mL/min — AB (ref >60)
Glucose, Bld: 144 mg/dL — ABNORMAL HIGH (ref 65–99)
POTASSIUM: 4.7 mmol/L (ref 3.5–5.1)
SODIUM: 132 mmol/L — AB (ref 135–145)

## 2018-02-21 MED ORDER — ROSUVASTATIN CALCIUM 10 MG PO TABS: 10.0000 mg | ORAL_TABLET | Freq: Every day | ORAL | 0 refills | Status: DC

## 2018-02-21 MED ORDER — PREDNISONE 10 MG PO TABS: ORAL_TABLET | ORAL | 0 refills | Status: DC

## 2018-02-21 MED ORDER — PREDNISONE 5 MG PO TABS: 5.0000 mg | ORAL_TABLET | Freq: Every day | ORAL | Status: DC

## 2018-02-21 MED ORDER — TRIAMTERENE-HCTZ 37.5-25 MG PO TABS: 1.0000 | ORAL_TABLET | Freq: Every day | ORAL | 0 refills | Status: DC

## 2018-02-21 MED ORDER — IPRATROPIUM-ALBUTEROL 0.5-2.5 (3) MG/3ML IN SOLN: 3.0000 mL | Freq: Two times a day (BID) | RESPIRATORY_TRACT | Status: DC

## 2018-02-21 NOTE — Discharge Instructions (Signed)

## 2018-02-21 NOTE — Discharge Summary (Signed)
Physician Discharge Summary  FELICHA FRAYNE PPI:[id number] DOB: [birth year] DOA: 02/12/2018  PCP: Renato Shin, MD  Admit date: 02/12/2018 Discharge date: 02/21/2018  Admitted From: Home Disposition: Home  Recommendations for Outpatient Follow-up:  1. Follow up with PCP in 1 week 2. Please obtain BMP/CBC in one week 3. Please follow up on the following pending results: None  Home Health: PT, OT Equipment/Devices: Tub, rolling walker  Discharge Condition: Stable CODE STATUS: Full code Diet recommendation: Heart healthy   Brief/Interim Summary:  Admission HPI written by    Chief Complaint: Shortness of breath  HPI: [name] is a 77 y.o. female with medical history significant for chronic asthma, CAD status post cardiac catheterization in January 2009, status post MI in 2011, diabetes, history of ESRD status post dialysis, now status post renal transplant since September 2012, on tacrolimus and chronic steroid followed at [facility], GERD, history of gout, hypertension, hyperlipidemia, hyperparathyroidism, presenting today with severe shortness of breath.  Patient has chronic issues with her asthma, usually able to treated with nebulizers at home, however she failed those treatments, feeling that this was getting worse.  She also reported dry cough.  She denies any fever or chills. Denies sick contacts, She denies any swelling in the lower extremities.  She reports substernal in the lower portion of her chest some tightness with deep inspiration, she denies any palpitations.  The patient denies any prior hospitalization or ICU admission regarding her breathing issues.   ED Course:  BP (!) 133/57   Pulse (!) 115   Temp 97.9 F (36.6 C) (Oral)   Resp 19   Ht 5' 2.5" (1.588 m)   Wt 73.9 kg (163 lb)   SpO2 99%   BMI 29.34 kg/m   At the ED, the patient received 3 breathing treatments, with continuous nebulizer, and Solu-Medrol 125 mg x1, however she did not show much  improvement. She was placed on BiPAP, with some respiratory comfort. He also received a dose of magnesium. Glucose 95 Creatinine 1.21, stable GFR 49 BNP 91.5 Troponin 0 White count 8.3, hemoglobin 13.4, platelets 137 (likely related to tacrolimus) Chest x-ray showed edema or consolidation    Hospital course:  Acute respiratory failure with hypoxia Secondary to asthma exacerbation. No PE on CTA. Required bipap which was weaned. Oxygen weaned to off and patient tolerated ambulation without oxygen.  Asthma exacerbation Severe exacerbation. Treated with Solumedrol, transitioned to prednisone, and nebulizer treatments. Discharge with prednisone taper.  Hyperkalemia Mild. Resolved.  Acute kidney injury on CKD 3 History of kidney transplant Peak of 2.1. Baseline of 1.2. Continues to trend down with IV fluids. Continued Prograf  Thrombocytopenia Stable. No bleeding.  Chronic rhinitis Continued nasal spray  Essential hypertension Continued amlodipine  Hyperlipidemia Continued Crestor  GERD Continued PPI  Diabetes mellitus, type 2 Continued Lantus and SSI  CAD Stable. Continued Crestor  Headache Patient with exposure to bacterial meningitis. Symptoms appear consistent with migraine. Resolved.    Discharge Diagnoses:  Principal Problem:   Severe persistent asthma with acute exacerbation Active Problems:   GOUT   THROMBOCYTOPENIA   Secondary renal hyperparathyroidism (HCC)   Anemia   Coronary artery disease   Hyperlipidemia   Hypertension   Aspirin allergy   Hx of CABG   S/P kidney transplant   H/O steroid therapy   Diabetes (HCC)   GERD (gastroesophageal reflux disease)   Severe persistent asthma with (acute) exacerbation    Discharge Instructions   Allergies as of 02/21/2018  Reactions   Lactose Other (See Comments)   Asa Arthritis Strength-antacid [aspirin Buffered] Other (See Comments)   STOMACH BURNS, N/V    Aspirin Nausea And  Vomiting   Per pt. "can tolerate the enteric coated tablets".    Atorvastatin Other (See Comments)   Patient's skin was skin was sensitive   Banana Nausea And Vomiting   Lactose Intolerance (gi) Nausea And Vomiting   Lisinopril Cough      Medication List    STOP taking these medications   ondansetron 4 MG tablet Commonly known as:  ZOFRAN     TAKE these medications   albuterol (2.5 MG/3ML) 0.083% nebulizer solution Commonly known as:  PROVENTIL Inhale 2.5 mg into the lungs every 6 (six) hours as needed for wheezing.   amLODipine 10 MG tablet Commonly known as:  NORVASC Take 10 mg by mouth every morning.   aspirin EC 81 MG tablet Take 81 mg by mouth daily.   azaTHIOprine 50 MG tablet Commonly known as:  IMURAN Take 50 mg by mouth daily.   Azelastine HCl 0.15 % Soln Place 1 spray into the nose 2 (two) times daily.   BREO ELLIPTA 200-25 MCG/INH Aepb Generic drug:  fluticasone furoate-vilanterol Inhale 1 puff into the lungs daily.   calcitRIOL 0.25 MCG capsule Commonly known as:  ROCALTROL Take 0.25 mcg by mouth daily. Alternate 2 tablets one day, 3 tablets the next day.   fluticasone 50 MCG/ACT nasal spray Commonly known as:  FLONASE Place 1 spray into both nostrils 2 (two) times daily.   glipiZIDE 5 MG tablet Commonly known as:  GLUCOTROL Take 5 mg by mouth daily before breakfast.   nitroGLYCERIN 0.4 MG SL tablet Commonly known as:  NITROSTAT Place 1 tablet (0.4 mg total) under the tongue every 5 (five) minutes as needed for chest pain.   omeprazole 20 MG capsule Commonly known as:  PRILOSEC Take 20 mg by mouth daily.   predniSONE 10 MG tablet Commonly known as:  DELTASONE Take 4 tablets (40 mg total) by mouth daily with breakfast for 2 days, THEN 3 tablets (30 mg total) daily with breakfast for 3 days, THEN 2 tablets (20 mg total) daily with breakfast for 3 days, THEN 1 tablet (10 mg total) daily with breakfast for 4 days. Start taking on:  02/21/2018 What  changed:  You were already taking a medication with the same name, and this prescription was added. Make sure you understand how and when to take each.   predniSONE 5 MG tablet Commonly known as:  DELTASONE Take 1 tablet (5 mg total) by mouth daily. Start taking on:  03/04/2018 What changed:    additional instructions  These instructions start on 03/04/2018. If you are unsure what to do until then, ask your doctor or other care provider.   rosuvastatin 10 MG tablet Commonly known as:  CRESTOR Take 1 tablet (10 mg total) by mouth at bedtime. What changed:    how much to take  when to take this  additional instructions   SPIRIVA RESPIMAT 1.25 MCG/ACT Aers Generic drug:  Tiotropium Bromide Monohydrate Inhale 1 puff into the lungs daily.   tacrolimus 1 MG capsule Commonly known as:  PROGRAF Take 5 mg by mouth every 12 (twelve) hours. Pt takes medication at 9 am & 9 pm   triamterene-hydrochlorothiazide 37.5-25 MG tablet Commonly known as:  MAXZIDE-25 Take 1 tablet by mouth daily. Start taking on:  02/22/2018  Durable Medical Equipment  (From admission, onward)        Start     Ordered   02/21/18 1456  For home use only DME Tub bench  Once     02/21/18 1457   02/21/18 1456  For home use only DME Walker rolling  Once    Question Answer Comment  Patient needs a walker to treat with the following condition Asthma exacerbation   Patient needs a walker to treat with the following condition Acute respiratory failure with hypoxia ([place])      02/21/18 1457     Follow-up Information    Renato Shin, MD. Schedule an appointment as soon as possible for a visit in 1 week(s).   Specialty:  Endocrinology Contact information: [address] [place] [address] [phone]          Allergies  Allergen Reactions  . Lactose Other (See Comments)  . Asa Arthritis Strength-Antacid [Aspirin Buffered] Other (See Comments)    STOMACH BURNS, N/V   .  Aspirin Nausea And Vomiting    Per pt. "can tolerate the enteric coated tablets".   . Atorvastatin Other (See Comments)    Patient's skin was skin was sensitive  . Banana Nausea And Vomiting  . Lactose Intolerance (Gi) Nausea And Vomiting  . Lisinopril Cough    Consultations:  None   Procedures/Studies: Ct Angio Chest Pe W Or Wo Contrast  Result Date: 02/12/2018 CLINICAL DATA:  Dyspnea for several days with wheezing. EXAM: CT ANGIOGRAPHY CHEST WITH CONTRAST TECHNIQUE: Multidetector CT imaging of the chest was performed using the standard protocol during bolus administration of intravenous contrast. Multiplanar CT image reconstructions and MIPs were obtained to evaluate the vascular anatomy. CONTRAST:  182mL ISOVUE-370 IOPAMIDOL (ISOVUE-370) INJECTION 76% COMPARISON:  02/12/2018 chest radiograph. 10/16/2011 chest CT angiogram. FINDINGS: Cardiovascular: The study is moderate quality for the evaluation of pulmonary embolism, with evaluation of the segmental and subsegmental arteries limited by motion artifact. There are no convincing filling defects in the central, lobar, segmental or subsegmental pulmonary artery branches to suggest acute pulmonary embolism. Atherosclerotic nonaneurysmal thoracic aorta. Dilated main pulmonary artery (3.5 cm diameter), mildly increased from 3.3 cm. Normal heart size. No significant pericardial fluid/thickening. Left main and 3 vessel coronary atherosclerosis status post CABG. Mediastinum/Nodes: No discrete thyroid nodules. Unremarkable esophagus. No pathologically enlarged axillary, mediastinal or hilar lymph nodes. Lungs/Pleura: No pneumothorax. No pleural effusion. Moderate to severe centrilobular emphysema with diffuse bronchial wall thickening. Evidence of bronchomalacia, particularly in the central right airways. Solid 4 mm right middle lobe pulmonary nodule (series 6/image 109), new. No acute consolidative airspace disease, lung masses or additional significant  pulmonary nodules. Upper abdomen: No acute abnormality. Musculoskeletal: No aggressive appearing focal osseous lesions. Discontinuities are noted in the 2 upper most sternotomy wires, unchanged. Mild thoracic spondylosis. Review of the MIP images confirms the above findings. IMPRESSION: 1. No evidence of pulmonary embolism. 2. Solitary new solid 4 mm right middle lobe pulmonary nodule. No follow-up needed if patient is low-risk. Non-contrast chest CT can be considered in 12 months if patient is high-risk. This recommendation follows the consensus statement: Guidelines for Management of Incidental Pulmonary Nodules Detected on CT Images:From the Fleischner Society 2017; published online before print (10.1148/radiol.[id number]). 3. 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 4. Evidence of bronchomalacia, particularly in the central right airways. 5. Dilated main pulmonary artery, suggesting pulmonary arterial hypertension. Aortic Atherosclerosis (ICD10-I70.0) and Emphysema (ICD10-J43.9). Electronically Signed   By:  Ilona Sorrel M.D.   On: 02/12/2018 12:44   Dg Chest Port 1 View  Result Date: 02/17/2018 CLINICAL DATA:  Acute respiratory failure with hypoxia EXAM: PORTABLE CHEST 1 VIEW COMPARISON:  CT chest 02/12/2018 FINDINGS: There is no focal parenchymal opacity. There is no pleural effusion or pneumothorax. The heart and mediastinal contours are unremarkable. There is evidence of prior CABG. The osseous structures are unremarkable. IMPRESSION: No active disease. Electronically Signed   By: Kathreen Devoid   On: 02/17/2018 11:25   Dg Chest Port 1 View  Result Date: 02/12/2018 CLINICAL DATA:  Shortness of breath and wheezing.  Cough. EXAM: PORTABLE CHEST 1 VIEW COMPARISON:  Mar 28, 2015 FINDINGS: There is no edema or consolidation. The heart size and pulmonary vascularity are normal. No adenopathy. Patient is status post coronary artery bypass grafting.  Several sternal wires are fractured, stable. There is aortic atherosclerosis. No adenopathy. Evidence of old trauma involving the lateral left clavicle. IMPRESSION: No edema or consolidation. Stable cardiac silhouette. There is aortic atherosclerosis. Aortic Atherosclerosis (ICD10-I70.0). Electronically Signed   By: Lowella Grip III M.D.   On: 02/12/2018 09:26      Subjective: Breathing significantly improved.  Discharge Exam: Vitals:   02/21/18 0756 02/21/18 1206  BP:  (!) 149/70  Pulse:  92  Resp:  (!) 22  Temp:  98.6 F (37 C)  SpO2: 93% 96%   Vitals:   02/21/18 0512 02/21/18 0751 02/21/18 0756 02/21/18 1206  BP: (!) 148/84 128/64  (!) 149/70  Pulse: 94 84  92  Resp: 18 18  (!) 22  Temp: 98.7 F (37.1 C) 98.6 F (37 C)  98.6 F (37 C)  TempSrc: Oral Oral  Oral  SpO2: 97% 94% 93% 96%  Weight: 75.2 kg (165 lb 12.6 oz)     Height:        General: Pt is alert, awake, not in acute distress Cardiovascular: RRR, S1/S2 +, no rubs, no gallops Respiratory: CTA bilaterally, no wheezing, no rhonchi Abdominal: Soft, NT, ND, bowel sounds + Extremities: no edema, no cyanosis    The results of significant diagnostics from this hospitalization (including imaging, microbiology, ancillary and laboratory) are listed below for reference.     Microbiology: Recent Results (from the past 240 hour(s))  MRSA PCR Screening     Status: None   Collection Time: 02/12/18  8:29 PM  Result Value Ref Range Status   MRSA by PCR NEGATIVE NEGATIVE Final    Comment:        The GeneXpert MRSA Assay (FDA approved for NASAL specimens only), is one component of a comprehensive MRSA colonization surveillance program. It is not intended to diagnose MRSA infection nor to guide or monitor treatment for MRSA infections. Performed at [facility], [place] [address], [place], [place] [address]      Labs: BNP (last 3 results) Recent Labs    02/12/18 0918  BNP 11.9   Basic Metabolic  Panel: Recent Labs  Lab 02/15/18 0258 02/16/18 0312 02/17/18 0243 02/18/18 0249 02/19/18 0225 02/20/18 0401 02/21/18 0234  NA 135 134* 133* 134* 135 133* 132*  K 4.5 5.2* 5.2* 5.2* 4.5 4.5 4.7  CL 100* 100* 98* 100* 100* 96* 96*  CO2 23 24 25 26 26 24 24   GLUCOSE 189* 169* 180* 157* 108* 126* 144*  BUN 49* 44* 33* 31* 32* 32* 31*  CREATININE 1.69* 1.50* 1.31* 1.32* 1.20* 1.17* 1.33*  CALCIUM 9.3 8.9 9.1 8.6* 8.7* 8.6* 9.1  MG 2.9* 2.7*  --   --   --   --  2.5*   Liver Function Tests: No results for input(s): AST, ALT, ALKPHOS, BILITOT, PROT, ALBUMIN in the last 168 hours. No results for input(s): LIPASE, AMYLASE in the last 168 hours. No results for input(s): AMMONIA in the last 168 hours. CBC: Recent Labs  Lab 02/15/18 0258 02/16/18 0312 02/18/18 0249 02/21/18 0234  WBC 18.6* 13.9* 9.8 11.6*  HGB 13.1 12.4 11.8* 12.7  HCT 37.6 36.3 34.0* 36.6  MCV 72.6* 73.3* 73.3* 72.6*  PLT 153 142* 138* 143*   Cardiac Enzymes: No results for input(s): CKTOTAL, CKMB, CKMBINDEX, TROPONINI in the last 168 hours. BNP: Invalid input(s): POCBNP CBG: Recent Labs  Lab 02/20/18 1620 02/20/18 2131 02/21/18 0605 02/21/18 0744 02/21/18 1202  GLUCAP 146* 138* 119* 123* 170*   D-Dimer No results for input(s): DDIMER in the last 72 hours. Hgb A1c No results for input(s): HGBA1C in the last 72 hours. Lipid Profile No results for input(s): CHOL, HDL, LDLCALC, TRIG, CHOLHDL, LDLDIRECT in the last 72 hours. Thyroid function studies No results for input(s): TSH, T4TOTAL, T3FREE, THYROIDAB in the last 72 hours.  Invalid input(s): FREET3 Anemia work up Recent Labs    02/20/18 0807  VITAMINB12 2,537*  FOLATE 11.5  FERRITIN 128  TIBC 279  IRON 92  RETICCTPCT 0.7   Urinalysis    Component Value Date/Time   COLORURINE YELLOW 02/12/2018 2129   APPEARANCEUR CLEAR 02/12/2018 2129   LABSPEC >1.046 (H) 02/12/2018 2129   PHURINE 5.0 02/12/2018 2129   GLUCOSEU NEGATIVE 02/12/2018  2129   GLUCOSEU NEGATIVE 06/21/2012 0819   HGBUR NEGATIVE 02/12/2018 2129   BILIRUBINUR NEGATIVE 02/12/2018 2129   KETONESUR 5 (A) 02/12/2018 2129   PROTEINUR 100 (A) 02/12/2018 2129   UROBILINOGEN 0.2 12/10/2012 1056   NITRITE NEGATIVE 02/12/2018 2129   LEUKOCYTESUR NEGATIVE 02/12/2018 2129   Sepsis Labs Invalid input(s): PROCALCITONIN,  WBC,  LACTICIDVEN Microbiology Recent Results (from the past 240 hour(s))  MRSA PCR Screening     Status: None   Collection Time: 02/12/18  8:29 PM  Result Value Ref Range Status   MRSA by PCR NEGATIVE NEGATIVE Final    Comment:        The GeneXpert MRSA Assay (FDA approved for NASAL specimens only), is one component of a comprehensive MRSA colonization surveillance program. It is not intended to diagnose MRSA infection nor to guide or monitor treatment for MRSA infections. Performed at [facility], [place] [address] [place], [place] [address]      SIGNED:   Cordelia Poche, MD [facility] 02/21/2018, 4:15 PM Pager [phone]  If 7PM-7AM, please contact night-coverage [email or web address] Password [email or web address]

## 2018-02-21 NOTE — Progress Notes (Signed)
Occupational Therapy Treatment Patient Details Name: [name] MRN: [id number] DOB: 10-25-1941 Today's Date: 02/21/2018    History of present illness Patient is a 77 y/o female who presents with SOB, wheezing and cough. Admitted with acute respiratory failure with hypoxia secondary to asthma exacerbation. PMH includes kidney transplant, HTN, MI, DM. CAD, MRSA.   OT comments  Pt feeling much better. Walked pushing dynamap to nurses station x 2 without rest break with 02 sats 94% on RA, RN notified and left 02 off once pt returned to her room. Pt completed toileting, standing grooming and dressing modified independently. Reinforced energy conservation strategies.   Follow Up Recommendations  Home health OT;Supervision - Intermittent    Equipment Recommendations  Tub/shower bench    Recommendations for Other Services      Precautions / Restrictions         Mobility Bed Mobility Overal bed mobility: Modified Independent             General bed mobility comments: HOB up slightly  Transfers Overall transfer level: Modified independent Equipment used: (pushed dynamap)             General transfer comment: did not use device    Balance     Sitting balance-Leahy Scale: Good       Standing balance-Leahy Scale: Good                             ADL either performed or assessed with clinical judgement   ADL       Grooming: Wash/dry hands;Standing;Modified independent           Upper Body Dressing : Set up;Sitting   Lower Body Dressing: Sit to/from stand;Set up   Toilet Transfer: Modified Independent;Ambulation   Toileting- Clothing Manipulation and Hygiene: Modified independent;Sit to/from stand       Functional mobility during ADLs: Supervision/safety(pushing dynamap) General ADL Comments: Pt ambulated in hall with 02 sats 94% or above on RA pushing dynamap.     Vision       Perception     Praxis      Cognition  Arousal/Alertness: Awake/alert Behavior During Therapy: WFL for tasks assessed/performed Overall Cognitive Status: Within Functional Limits for tasks assessed                                          Exercises Other Exercises Other Exercises: reinforced energy conservation strategies while ambulating    Shoulder Instructions       General Comments      Pertinent Vitals/ Pain       Pain Assessment: No/denies pain  Home Living                                          Prior Functioning/Environment              Frequency  Min 2X/week        Progress Toward Goals  OT Goals(current goals can now be found in the care plan section)  Progress towards OT goals: Progressing toward goals  Acute Rehab OT Goals Patient Stated Goal: to be able to walk and return home OT Goal Formulation: With patient Time For Goal Achievement: 03/04/18 Potential to Achieve Goals: Good  Plan  Discharge plan remains appropriate    Co-evaluation                 AM-PAC PT "6 Clicks" Daily Activity     Outcome Measure   Help from another person eating meals?: None Help from another person taking care of personal grooming?: None Help from another person toileting, which includes using toliet, bedpan, or urinal?: None Help from another person bathing (including washing, rinsing, drying)?: A Little Help from another person to put on and taking off regular upper body clothing?: None Help from another person to put on and taking off regular lower body clothing?: None 6 Click Score: 23    End of Session Equipment Utilized During Treatment: Gait belt  OT Visit Diagnosis: Unsteadiness on feet (R26.81);Muscle weakness (generalized) (M62.81)   Activity Tolerance Patient tolerated treatment well   Patient Left in chair;with call bell/phone within reach   Nurse Communication Other (comment)(02 sats)        Time: 5409-8119 OT Time Calculation (min):  27 min  Charges: OT General Charges $OT Visit: 1 Visit OT Treatments $Self Care/Home Management : 23-37 mins  02/21/2018 Nestor Lewandowsky, OTR/L Pager: [phone]   [name] 02/21/2018, 9:50 AM

## 2018-02-21 NOTE — Care Management Note (Signed)
Case Management Note  Patient Details  Name: [name] MRN: [id number] Date of Birth: 11/19/1941  Subjective/Objective:                    Action/Plan: Pt discharging home with orders for [facility] services. CM previously spoke to patient and she chose [facility] for [facility] services. Butch Penny made aware of orders placed and d/c today.  Pt with orders for walker and shower chair. Pt states she has a walker at home. Insurance will not cover a shower chair. Pt can purchase one at outside vendor.  Pt states her grandson will provide transportation home and intermittent supervision at home. Pt states her neighbor will also provide some supervision at home.   Expected Discharge Date:                  Expected Discharge Plan:  [place]  In-House Referral:  Clinical Social Work  Discharge planning Services  CM Consult  Post Acute Care Choice:  Home Health Choice offered to:  Patient  DME Arranged:    DME Agency:     HH Arranged:  PT, OT HH Agency:  [facility]  Status of Service:  Completed, signed off  If discussed at [place] of Stay Meetings, dates discussed:    Additional Comments:  Pollie Friar, RN 02/21/2018, 3:09 PM

## 2018-02-23 DIAGNOSIS — M109 Gout, unspecified: Secondary | ICD-10-CM | POA: Diagnosis not present

## 2018-02-23 DIAGNOSIS — J310 Chronic rhinitis: Secondary | ICD-10-CM | POA: Diagnosis not present

## 2018-02-23 DIAGNOSIS — N2581 Secondary hyperparathyroidism of renal origin: Secondary | ICD-10-CM | POA: Diagnosis not present

## 2018-02-23 DIAGNOSIS — Z940 Kidney transplant status: Secondary | ICD-10-CM | POA: Diagnosis not present

## 2018-02-23 DIAGNOSIS — Z87891 Personal history of nicotine dependence: Secondary | ICD-10-CM | POA: Diagnosis not present

## 2018-02-23 DIAGNOSIS — Z7951 Long term (current) use of inhaled steroids: Secondary | ICD-10-CM | POA: Diagnosis not present

## 2018-02-23 DIAGNOSIS — Z7984 Long term (current) use of oral hypoglycemic drugs: Secondary | ICD-10-CM | POA: Diagnosis not present

## 2018-02-23 DIAGNOSIS — Z96642 Presence of left artificial hip joint: Secondary | ICD-10-CM | POA: Diagnosis not present

## 2018-02-23 DIAGNOSIS — N183 Chronic kidney disease, stage 3 (moderate): Secondary | ICD-10-CM | POA: Diagnosis not present

## 2018-02-23 DIAGNOSIS — D696 Thrombocytopenia, unspecified: Secondary | ICD-10-CM | POA: Diagnosis not present

## 2018-02-23 DIAGNOSIS — I252 Old myocardial infarction: Secondary | ICD-10-CM | POA: Diagnosis not present

## 2018-02-23 DIAGNOSIS — I2510 Atherosclerotic heart disease of native coronary artery without angina pectoris: Secondary | ICD-10-CM | POA: Diagnosis not present

## 2018-02-23 DIAGNOSIS — I129 Hypertensive chronic kidney disease with stage 1 through stage 4 chronic kidney disease, or unspecified chronic kidney disease: Secondary | ICD-10-CM | POA: Diagnosis not present

## 2018-02-23 DIAGNOSIS — E785 Hyperlipidemia, unspecified: Secondary | ICD-10-CM | POA: Diagnosis not present

## 2018-02-23 DIAGNOSIS — E1122 Type 2 diabetes mellitus with diabetic chronic kidney disease: Secondary | ICD-10-CM | POA: Diagnosis not present

## 2018-02-23 DIAGNOSIS — Z7982 Long term (current) use of aspirin: Secondary | ICD-10-CM | POA: Diagnosis not present

## 2018-02-23 DIAGNOSIS — J4551 Severe persistent asthma with (acute) exacerbation: Secondary | ICD-10-CM | POA: Diagnosis not present

## 2018-02-23 DIAGNOSIS — Z951 Presence of aortocoronary bypass graft: Secondary | ICD-10-CM | POA: Diagnosis not present

## 2018-02-23 DIAGNOSIS — D649 Anemia, unspecified: Secondary | ICD-10-CM | POA: Diagnosis not present

## 2018-02-25 ENCOUNTER — Telehealth: Payer: Self-pay | Admitting: Endocrinology

## 2018-02-25 NOTE — Telephone Encounter (Signed)
Pt has not been seen here in years, so I would need to see pt first

## 2018-02-25 NOTE — Telephone Encounter (Signed)
Suezanne Jacquet - Physical Therapist at [place] ph# [phone] called re:he saw patient over the weekens-Recommended frequency is 1 week one and two x week for 4 weeks. Please call him at above ph# to give verbal authorization that the above  frequency is okay

## 2018-02-25 NOTE — Telephone Encounter (Signed)
I called and spoke with Suezanne Jacquet. He stated that he would contact patient & ask if she had possibly been seeing another provider else where, or what she wanted to do in regards to an appointment.

## 2018-02-27 ENCOUNTER — Telehealth: Payer: Self-pay | Admitting: Cardiovascular Disease

## 2018-02-27 ENCOUNTER — Other Ambulatory Visit: Payer: Self-pay | Admitting: Endocrinology

## 2018-02-27 DIAGNOSIS — I129 Hypertensive chronic kidney disease with stage 1 through stage 4 chronic kidney disease, or unspecified chronic kidney disease: Secondary | ICD-10-CM | POA: Diagnosis not present

## 2018-02-27 DIAGNOSIS — I2510 Atherosclerotic heart disease of native coronary artery without angina pectoris: Secondary | ICD-10-CM | POA: Diagnosis not present

## 2018-02-27 DIAGNOSIS — J4551 Severe persistent asthma with (acute) exacerbation: Secondary | ICD-10-CM | POA: Diagnosis not present

## 2018-02-27 DIAGNOSIS — N183 Chronic kidney disease, stage 3 (moderate): Secondary | ICD-10-CM | POA: Diagnosis not present

## 2018-02-27 DIAGNOSIS — I252 Old myocardial infarction: Secondary | ICD-10-CM | POA: Diagnosis not present

## 2018-02-27 DIAGNOSIS — Z1231 Encounter for screening mammogram for malignant neoplasm of breast: Secondary | ICD-10-CM

## 2018-02-27 DIAGNOSIS — E1122 Type 2 diabetes mellitus with diabetic chronic kidney disease: Secondary | ICD-10-CM | POA: Diagnosis not present

## 2018-02-27 NOTE — Telephone Encounter (Signed)
Received call directly from operator from patient who c/o SOB. She was discharged from [facility] on 4/4 with asthma exacerbation and acute respiratory failure with hypoxia. She states she is not on home oxygen. Reports compliance with all medications. She is noticeably winded while talking to me. States yesterday was worst day since discharge. Her pulmonologist is at [facility] and she states f/u is in May. I advised her to call them for sooner f/u. I asked if she is having chest discomfort and she denies at present but states during hospitalization she had chest pressure that ranged from "5 to 9" on the 0 to 10 pain scale. She states she called to get an appointment with Dr. Acie Fredrickson. Her EF by echo on 02/15/18 is 60-65% and she had a normal lexiscan myoview 5/18. Hx CABG x 4 2003. She is scheduled for office visit on 5/1 with V. Bhagat, PA and I advised I am searching for a sooner appointment. Routing to Dr. Acie Fredrickson for additional advice in the interim. Patient aware that I will call back later with advice and/or change in appointment. She verbalized understanding and thanked me for the call.

## 2018-02-27 NOTE — Telephone Encounter (Signed)
I suspect this is related to her asthma / COPD She had + troponins during the hospitalization but these are likely due to RV strain She should try to get in with her pulmonary doctor. If the pains worsen, she should come to the [facility] .

## 2018-02-27 NOTE — Telephone Encounter (Signed)
Spoke with patient who states she called Dr. Steffanie Dunn office (pulmonology) and spoke with someone who told her they would call her back later today. She states she has requested a sooner appointment with them. I reviewed Dr. Elmarie Shiley advice with her and she verbalized understanding. I advised that she may call back with questions or if encouraged to do so by her pulmonologist and I will work on getting her a sooner appointment with Dr. Acie Fredrickson or APP. She verbalized understanding and agreement with plan and thanked me for the call.

## 2018-02-27 NOTE — Telephone Encounter (Signed)
New Message   Pt c/o Shortness Of Breath: STAT if SOB developed within the last 24 hours or pt is noticeably SOB on the phone  1. Are you currently SOB (can you hear that pt is SOB on the phone)? Yes, can be heard over the phone  2. How long have you been experiencing SOB? So sob about 2 weeks. She was in the hospital for ten days, was released on 02/21/2018  3. Are you SOB when sitting or when up moving around? Either when she sitting or moving.   4. Are you currently experiencing any other symptoms? She said she also feels a flutter. As well as fatigue.

## 2018-02-28 ENCOUNTER — Telehealth: Payer: Self-pay | Admitting: Cardiovascular Disease

## 2018-02-28 ENCOUNTER — Telehealth: Payer: Self-pay | Admitting: Endocrinology

## 2018-02-28 ENCOUNTER — Ambulatory Visit (INDEPENDENT_AMBULATORY_CARE_PROVIDER_SITE_OTHER): Payer: Medicare Other | Admitting: Endocrinology

## 2018-02-28 ENCOUNTER — Encounter: Payer: Self-pay | Admitting: Endocrinology

## 2018-02-28 VITALS — BP 142/66 | HR 94 | Wt 149.2 lb

## 2018-02-28 DIAGNOSIS — J4551 Severe persistent asthma with (acute) exacerbation: Secondary | ICD-10-CM

## 2018-02-28 DIAGNOSIS — Z23 Encounter for immunization: Secondary | ICD-10-CM | POA: Diagnosis not present

## 2018-02-28 NOTE — Telephone Encounter (Signed)
I called and gave Amy verbal orders.

## 2018-02-28 NOTE — Telephone Encounter (Addendum)
Instructed Amy to contact PCP for PT orders. Gave her PCP information on file. She was grateful for assistance.  Per conversation with Dr. Elmarie Shiley nurse yesterday, offered patient OV Monday at 0820 and she accepted. She was grateful for call and agrees with treatment plan.

## 2018-02-28 NOTE — Telephone Encounter (Signed)
Amy with [facility] would like to get Verbal orders for patient to start Physical Therapy.    AMY [phone]

## 2018-02-28 NOTE — Telephone Encounter (Signed)
New message:     [name] is calling from [facility] to get a order for pt to do rehab.

## 2018-02-28 NOTE — Patient Instructions (Addendum)
Please go back to see the lung specialist at [facility], as scheduled.  check your blood sugar once a day.  vary the time of day when you check, between before the 3 meals, and at bedtime.  also check if you have symptoms of your blood sugar being too high or too low.  please keep a record of the readings and bring it to your next appointment here (or you can bring the meter itself).  You can write it on any piece of paper.  please call us sooner if your blood sugar goes below 70, or if you have a lot of readings over 200.   Please continue the same medications for now.   Please come back for a follow-up appointment in 1 month.

## 2018-02-28 NOTE — Telephone Encounter (Signed)
OK 

## 2018-02-28 NOTE — Progress Notes (Signed)
Subjective:    Patient ID: [name], female    DOB: Apr 21, 1941, 77 y.o.   MRN: [id number]  HPI Pt states DM was dx'ed in 2013; she has mild if any neuropathy of the lower extremities, but she has associated CAD and renal failure; she has never been on insulin; pt says her diet is good, but exercise is limited by health probs; she has never had GDM, pancreatitis, pancreatic surgery, severe hypoglycemia or DKA.   She takes glipizide only.  She has a cbg meter, but does not check cbg's.  pt says sob is improved since hosp d/c, but persists.   Past Medical History:  Diagnosis Date  . 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 Asthma   . Coronary artery disease    cath 11/2007, grafts patent /  Nuclear, June, 2011, prior inferior MI with mild peri-infarct ischemia, anterior breast attenuation, EF 67%, done for renal transplant assessment / Low level exercise/Lexiscan Myoview (11/2013): EF 67%, no ischemi; normal study  . Diabetes mellitus    type 2  . ESRD on dialysis [facility])    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, 2012  . GERD (gastroesophageal reflux disease)   . GI bleed 11/26/2010   January, 2012 , AVM  . Gout   . History of methicillin resistant staphylococcus aureus (MRSA)   . Hyperlipidemia   . Hyperparathyroidism   . Hypertension   . Myocardial infarction ([place])   . Osteoporosis   . Pneumonia 08/2010    Hospitalization, October, 2011  . Primary osteoarthritis, 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  . S/P kidney transplant    September, 2012, [facility]  . Subacromial impingement of 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    Past Surgical History:  Procedure Laterality Date  . ABDOMINAL HYSTERECTOMY  1980'S  . ARTERIOVENOUS GRAFT PLACEMENT Left 03/04/2007   forearm  . BREAST EXCISIONAL BIOPSY    . BREAST SURGERY  YRS AGO   RT BREAST CYST REMOVED   . CARDIAC CATHETERIZATION  06/03/2002; 12/04/2007  . COLONOSCOPY WITH PROPOFOL N/A  07/14/2014   Procedure: COLONOSCOPY WITH PROPOFOL;  Surgeon: Lear Ng, MD;  Location: [facility];  Service: Endoscopy;  Laterality: N/A;  . CORONARY ARTERY BYPASS GRAFT  06/06/2002   x 4  . HOT HEMOSTASIS N/A 07/14/2014   Procedure: HOT HEMOSTASIS (ARGON PLASMA COAGULATION/BICAP);  Surgeon: Lear Ng, MD;  Location: [facility];  Service: Endoscopy;  Laterality: N/A;  . KIDNEY TRANSPLANT Right 08/04/2011  . ORIF PATELLA Left 04/06/2016   Procedure: OPEN REDUCTION INTERNAL (ORIF) LEFT  PATELLA;  Surgeon: Ninetta Lights, MD;  Location: [facility];  Service: Orthopedics;  Laterality: Left;  . SHOULDER ARTHROSCOPY WITH ROTATOR CUFF REPAIR AND SUBACROMIAL DECOMPRESSION Left 08/03/2016   Procedure: LEFT SHOULDER ARTHROSCOPY WITH ROTATOR CUFF REPAIR AND SUBACROMIAL DECOMPRESSION with distal claviculectomy and extentsive debridement;  Surgeon: Ninetta Lights, MD;  Location: [facility];  Service: Orthopedics;  Laterality: Left;  Block  . THROMBECTOMY AND REVISION OF ARTERIOVENTOUS (AV) GORETEX  GRAFT Left 05/08/2007   forearm  . TOTAL HIP ARTHROPLASTY  12/18/2012   Procedure: TOTAL HIP ARTHROPLASTY;  Surgeon: Ninetta Lights, MD;  Location: [facility];  Service: Orthopedics;  Laterality: Left;  Marland Kitchen VESICOVAGINAL FISTULA CLOSURE W/ TAH  1984    Social History   Socioeconomic History  . Marital status: Divorced    Spouse name: Not on file  .  Number of children: Not on file  . Years of education: Not on file  . Highest education level: Not on file  Occupational History  . Not on file  Social Needs  . Financial resource strain: Not on file  . Food insecurity:    Worry: Not on file    Inability: Not on file  . Transportation needs:    Medical: Not on file    Non-medical: Not on file  Tobacco Use  . Smoking status: Former Smoker    Packs/day: 1.00    Years: 8.00    Pack years: 8.00    Types: Cigarettes    Last attempt to quit: 11/21/1983    Years  since quitting: 34.3  . Smokeless tobacco: Never Used  Substance and Sexual Activity  . Alcohol use: No  . Drug use: No  . Sexual activity: Not on file  Lifestyle  . Physical activity:    Days per week: Not on file    Minutes per session: Not on file  . Stress: Not on file  Relationships  . Social connections:    Talks on phone: Not on file    Gets together: Not on file    Attends religious service: Not on file    Active member of club or organization: Not on file    Attends meetings of clubs or organizations: Not on file    Relationship status: Not on file  . Intimate partner violence:    Fear of current or ex partner: Not on file    Emotionally abused: Not on file    Physically abused: Not on file    Forced sexual activity: Not on file  Other Topics Concern  . Not on file  Social History Narrative  . Not on file    Current Outpatient Medications on File Prior to Visit  Medication Sig Dispense Refill  . albuterol (PROVENTIL) (2.5 MG/3ML) 0.083% nebulizer solution Inhale 2.5 mg into the lungs every 6 (six) hours as needed for wheezing.    Marland Kitchen amLODipine (NORVASC) 10 MG tablet Take 10 mg by mouth every morning.     Marland Kitchen aspirin EC 81 MG tablet Take 81 mg by mouth daily.    Marland Kitchen azaTHIOprine (IMURAN) 50 MG tablet Take 50 mg by mouth daily.    . Azelastine HCl 0.15 % SOLN Place 1 spray into the nose 2 (two) times daily.    . calcitRIOL (ROCALTROL) 0.25 MCG capsule Take 0.25 mcg by mouth daily. Alternate 2 tablets one day, 3 tablets the next day.    . fluticasone (FLONASE) 50 MCG/ACT nasal spray Place 1 spray into both nostrils 2 (two) times daily.    . fluticasone furoate-vilanterol (BREO ELLIPTA) 200-25 MCG/INH AEPB Inhale 1 puff into the lungs daily.    Marland Kitchen glipiZIDE (GLUCOTROL) 5 MG tablet Take 5 mg by mouth daily before breakfast.     . nitroGLYCERIN (NITROSTAT) 0.4 MG SL tablet Place 1 tablet (0.4 mg total) under the tongue every 5 (five) minutes as needed for chest pain. 25 tablet 6   . omeprazole (PRILOSEC) 20 MG capsule Take 20 mg by mouth daily.     . predniSONE (DELTASONE) 10 MG tablet Take 4 tablets (40 mg total) by mouth daily with breakfast for 2 days, THEN 3 tablets (30 mg total) daily with breakfast for 3 days, THEN 2 tablets (20 mg total) daily with breakfast for 3 days, THEN 1 tablet (10 mg total) daily with breakfast for 4 days. 27 tablet 0  . [  START ON 03/04/2018] predniSONE (DELTASONE) 5 MG tablet Take 1 tablet (5 mg total) by mouth daily.    . rosuvastatin (CRESTOR) 10 MG tablet Take 1 tablet (10 mg total) by mouth at bedtime. 30 tablet 0  . tacrolimus (PROGRAF) 1 MG capsule Take 5 mg by mouth every 12 (twelve) hours. Pt takes medication at 9 am & 9 pm    . Tiotropium Bromide Monohydrate (SPIRIVA RESPIMAT) 1.25 MCG/ACT AERS Inhale 1 puff into the lungs daily.    Marland Kitchen triamterene-hydrochlorothiazide (MAXZIDE-25) 37.5-25 MG tablet Take 1 tablet by mouth daily. 30 tablet 0   No current facility-administered medications on file prior to visit.     Allergies  Allergen Reactions  . Lactose Other (See Comments)  . Asa Arthritis Strength-Antacid [Aspirin Buffered] Other (See Comments)    STOMACH BURNS, N/V   . Aspirin Nausea And Vomiting    Per pt. "can tolerate the enteric coated tablets".   . Atorvastatin Other (See Comments)    Patient's skin was skin was sensitive  . Banana Nausea And Vomiting  . Lactose Intolerance (Gi) Nausea And Vomiting  . Lisinopril Cough    Family History  Problem Relation Age of Onset  . Breast cancer Sister   . Cancer Neg Hx     BP (!) 142/66 (BP Location: Left Arm, Patient Position: Sitting, Cuff Size: Normal)   Pulse 94   Wt 149 lb 3.2 oz (67.7 kg)   SpO2 (!) 89%   BMI 27.29 kg/m    Review of Systems She denies hypoglycemia    Objective:   Physical Exam VS: see vs page GEN: no distress.  In wheelchair.  HEAD: head: no deformity eyes: no periorbital swelling, no proptosis external nose and ears are normal mouth:  no lesion seen NECK: supple, thyroid is not enlarged CHEST WALL: no deformity LUNGS: clear to auscultation CV: reg rate and rhythm, no murmur ABD: abdomen is soft, nontender.  no hepatosplenomegaly.  not distended.  no hernia MUSCULOSKELETAL: muscle bulk and strength are grossly normal.  no obvious joint swelling.  gait is normal and steady EXTEMITIES: no deformity.  no ulcer on the feet.  feet are of normal color and temp.  no edema.  There is bilateral onychomycosis of the toenails PULSES: dorsalis pedis intact bilat.  no carotid bruit NEURO:  cn 2-12 grossly intact.   readily moves all 4's.  sensation is intact to touch on the feet SKIN:  Normal texture and temperature.  No rash or suspicious lesion is visible.   NODES:  None palpable at the neck PSYCH: alert, well-oriented.  Does not appear anxious nor depressed.   Lab Results  Component Value Date   WBC 11.6 (H) 02/21/2018   HGB 12.7 02/21/2018   HCT 36.6 02/21/2018   MCV 72.6 (L) 02/21/2018   PLT 143 (L) 02/21/2018   Lab Results  Component Value Date   CREATININE 1.33 (H) 02/21/2018   BUN 31 (H) 02/21/2018   NA 132 (L) 02/21/2018   K 4.7 02/21/2018   CL 96 (L) 02/21/2018   CO2 24 02/21/2018    Lab Results  Component Value Date   HGBA1C 5.7 (H) 02/13/2018    I have reviewed outside records, and summarized: Pt was recently in the hospital with severe asthma exacerbation.  She was d/c'ed home on steroid taper.      Assessment & Plan:  Asthma, new to me.   Type 2 DM, with renal failure: uncertain control--might be affected by steroids.  Patient Instructions  Please go back to see the lung specialist at [facility], as scheduled.  check your blood sugar once a day.  vary the time of day when you check, between before the 3 meals, and at bedtime.  also check if you have symptoms of your blood sugar being too high or too low.  please keep a record of the readings and bring it to your next appointment here (or you can bring  the meter itself).  You can write it on any piece of paper.  please call us sooner if your blood sugar goes below 70, or if you have a lot of readings over 200.   Please continue the same medications for now.   Please come back for a follow-up appointment in 1 month.

## 2018-03-01 ENCOUNTER — Telehealth: Payer: Self-pay | Admitting: Endocrinology

## 2018-03-01 ENCOUNTER — Telehealth: Payer: Self-pay

## 2018-03-01 DIAGNOSIS — E1122 Type 2 diabetes mellitus with diabetic chronic kidney disease: Secondary | ICD-10-CM | POA: Diagnosis not present

## 2018-03-01 DIAGNOSIS — I252 Old myocardial infarction: Secondary | ICD-10-CM | POA: Diagnosis not present

## 2018-03-01 DIAGNOSIS — N183 Chronic kidney disease, stage 3 (moderate): Secondary | ICD-10-CM | POA: Diagnosis not present

## 2018-03-01 DIAGNOSIS — I129 Hypertensive chronic kidney disease with stage 1 through stage 4 chronic kidney disease, or unspecified chronic kidney disease: Secondary | ICD-10-CM | POA: Diagnosis not present

## 2018-03-01 DIAGNOSIS — J4551 Severe persistent asthma with (acute) exacerbation: Secondary | ICD-10-CM | POA: Diagnosis not present

## 2018-03-01 DIAGNOSIS — I2510 Atherosclerotic heart disease of native coronary artery without angina pectoris: Secondary | ICD-10-CM | POA: Diagnosis not present

## 2018-03-01 NOTE — Telephone Encounter (Signed)
Corene Cornea with [facility] is calling in regards to oxygen therapy that was faxed over to them He would like a call back to discuss this and has a few questions.   [phone] Ex [phone]

## 2018-03-01 NOTE — Telephone Encounter (Signed)
I received a call from Amy with [facility] she was at the patient's home today. She said that patient stated she was supposed to receive oxygen therapy. Patient was SOB & was concerned she hadn't heard anything regarding having someone come to set this up. I see where referral was placed yesterday. I needed to able to fax something to [facility]. [facility] is on vacation today & I explained this to Amy. I told her that I would send a message to Dr. [name] & she wanted a call back if I was able to figure anything out. Amy's number is [phone].

## 2018-03-01 NOTE — Telephone Encounter (Signed)
Ok, so we'll cancel oxygen for now.

## 2018-03-01 NOTE — Telephone Encounter (Signed)
Options: 1. If a [facility] is off, another needs to cover. 2.  You can fax the referral which is in epic.

## 2018-03-01 NOTE — Telephone Encounter (Signed)
I have printed & faxed referral to [facility]. I also called Amy Lynch (physical therapist) to let her know this was done.

## 2018-03-01 NOTE — Telephone Encounter (Signed)
I spoke with Corene Cornea from [facility] he stated that currently with that referral she does not meet medicare requirements to be approved for O2 therapy. On the referral it states acute exacerbation & that needed to be removed. He also stated that notes needed to be more specific as well & stating how much oxygen she would need. For medicare to cover oxygen has to below 88 & he has faxed over a form to be filled out stating that hers kept falling below that. There is a guideline Corene Cornea stated that may be helpful it's an oxygen DME in Epic.

## 2018-03-03 ENCOUNTER — Telehealth: Payer: Self-pay | Admitting: Endocrinology

## 2018-03-03 NOTE — Telephone Encounter (Signed)
We discussed a number of health problems and symptoms when you were here.  I would be happy to consider the head pain, but please come back for another appointment, so we can give this symptom the time it needs.

## 2018-03-03 NOTE — Telephone Encounter (Signed)
-----   Message from Dorna Leitz, [place] sent at 03/01/2018  2:41 PM EDT ----- Regarding: Pain in patient's head Patient called today & asked that you order a x-ray for her head pain. She stated that you guys discussed this at her appointment earlier in the week. I asked if this was a chronic problem, but she stated that the pain just comes & goes. She also stated that pain isn't like a headache. By the end of our call she seemed think that she needed an antibiotic, but she stated that she didn't feel pain was allergy related. Please advise?

## 2018-03-04 ENCOUNTER — Encounter: Payer: Self-pay | Admitting: Cardiovascular Disease

## 2018-03-04 ENCOUNTER — Telehealth: Payer: Self-pay | Admitting: Endocrinology

## 2018-03-04 ENCOUNTER — Ambulatory Visit (INDEPENDENT_AMBULATORY_CARE_PROVIDER_SITE_OTHER): Payer: Medicare Other | Admitting: Cardiovascular Disease

## 2018-03-04 VITALS — BP 164/76 | HR 109 | Ht 62.0 in | Wt 148.8 lb

## 2018-03-04 DIAGNOSIS — I2510 Atherosclerotic heart disease of native coronary artery without angina pectoris: Secondary | ICD-10-CM | POA: Diagnosis not present

## 2018-03-04 DIAGNOSIS — E782 Mixed hyperlipidemia: Secondary | ICD-10-CM | POA: Diagnosis not present

## 2018-03-04 MED ORDER — ROSUVASTATIN CALCIUM 10 MG PO TABS: 10.0000 mg | ORAL_TABLET | Freq: Every day | ORAL | 3 refills | Status: DC

## 2018-03-04 NOTE — Progress Notes (Signed)
Cardiology Office Note   Date:  03/04/2018   ID:  [name], DOB 10-Jul-1941, MRN [id number]  PCP:  Renato Shin, MD  Cardiologist:  Mertie Moores, MD   Chief Complaint  Patient presents with  . Coronary Artery Disease   Problem list 1. CAD - CABG , 2001 2. ESRD - s/p renal transplant - Sept. 2012  3. GI bleed:  4. DM    [name] is a 77 y.o. female who presents  Today to follow-up shortness of breath. I saw her last April, 2015. Before that she had had some difficulties with shortness of breath. We thought that some of her symptoms were related to increased levels of Prograf.  Fortunately she is doing well. She is not having any chest pain or significant shortness of breath.  March 07, 2016:  Doing well.  Previous patient of Dr. Ron Parker. No issues.   Some DOE if she walks too far.  No Cp , has not had to take any NTG .  March 14, 2017:  Has had some dyspnea with exertion,  Seems to have worsened over the past month or so Has to stop and rest twice when walking to the mailbox   Has had some allergy issues Stays fatigued.   March 04, 2018:  [name] is seen back today for follow-up of her coronary artery disease. Been started on home oxygen.  She has history of chronic asthma end-stage renal disease, status post renal transplant Was in the hospital in March - was not D/C'd on home O2.  Has developed worsening dyspnea . O2 sats were in the 80s  No CP  O2 sat was 81% when she arrived in the office today  Came up to 90% with O2  2 liters / min   Past Medical History:  Diagnosis Date  . 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 Asthma   . Coronary artery disease    cath 11/2007, grafts patent /  Nuclear, June, 2011, prior inferior MI with mild peri-infarct ischemia, anterior breast attenuation, EF 67%, done for renal transplant assessment / Low level exercise/Lexiscan Myoview  (11/2013): EF 67%, no ischemi; normal study  . Diabetes mellitus    type 2  . ESRD on dialysis [facility])    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, 2012  . GERD (gastroesophageal reflux disease)   . GI bleed 11/26/2010   January, 2012 , AVM  . Gout   . History of methicillin resistant staphylococcus aureus (MRSA)   . Hyperlipidemia   . Hyperparathyroidism   . Hypertension   . Myocardial infarction ([place])   . Osteoporosis   . Pneumonia 08/2010    Hospitalization, October, 2011  . Primary osteoarthritis, 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  . S/P kidney transplant    September, 2012, [facility]  . Subacromial impingement of 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    Past Surgical History:  Procedure Laterality Date  . ABDOMINAL HYSTERECTOMY  1980'S  . ARTERIOVENOUS GRAFT PLACEMENT Left 03/04/2007   forearm  . BREAST EXCISIONAL BIOPSY    . BREAST SURGERY  YRS AGO   RT BREAST CYST REMOVED   . CARDIAC CATHETERIZATION  06/03/2002; 12/04/2007  . COLONOSCOPY WITH PROPOFOL N/A 07/14/2014   Procedure: COLONOSCOPY WITH PROPOFOL;  Surgeon: Lear Ng, MD;  Location: [facility];  Service: Endoscopy;  Laterality: N/A;  . CORONARY ARTERY BYPASS GRAFT  06/06/2002   x  4  . HOT HEMOSTASIS N/A 07/14/2014   Procedure: HOT HEMOSTASIS (ARGON PLASMA COAGULATION/BICAP);  Surgeon: Lear Ng, MD;  Location: [facility];  Service: Endoscopy;  Laterality: N/A;  . KIDNEY TRANSPLANT Right 08/04/2011  . ORIF PATELLA Left 04/06/2016   Procedure: OPEN REDUCTION INTERNAL (ORIF) LEFT  PATELLA;  Surgeon: Ninetta Lights, MD;  Location: [place];  Service: Orthopedics;  Laterality: Left;  . SHOULDER ARTHROSCOPY WITH ROTATOR CUFF REPAIR AND SUBACROMIAL DECOMPRESSION Left 08/03/2016   Procedure: LEFT SHOULDER ARTHROSCOPY WITH ROTATOR CUFF REPAIR AND SUBACROMIAL DECOMPRESSION with distal claviculectomy and extentsive debridement;  Surgeon: Ninetta Lights, MD;  Location: [place];  Service: Orthopedics;  Laterality:  Left;  Block  . THROMBECTOMY AND REVISION OF ARTERIOVENTOUS (AV) GORETEX  GRAFT Left 05/08/2007   forearm  . TOTAL HIP ARTHROPLASTY  12/18/2012   Procedure: TOTAL HIP ARTHROPLASTY;  Surgeon: Ninetta Lights, MD;  Location: [place];  Service: Orthopedics;  Laterality: Left;  Marland Kitchen VESICOVAGINAL FISTULA CLOSURE W/ TAH  1984    Patient Active Problem List   Diagnosis Date Noted  . Coronary artery disease     Priority: High  . Severe persistent asthma with (acute) exacerbation 02/13/2018  . GERD (gastroesophageal reflux disease) 02/12/2018  . Severe persistent asthma with acute exacerbation 02/12/2018  . Complete rotator cuff tear of 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  . Subacromial impingement of 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  . Primary osteoarthritis, 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  . History of methicillin resistant staphylococcus aureus (MRSA)   . Benign neoplasm of colon 07/14/2014  . Nausea & vomiting 12/22/2013  . Dyspnea 12/22/2013  . Diabetes ([place]) 02/28/2013  . Preop cardiovascular exam   . Type II or unspecified type diabetes mellitus without mention of complication, not stated as uncontrolled 06/05/2012  . Encounter for long-term (current) use of other medications 02/26/2012  . Type II or unspecified type diabetes mellitus with renal manifestations, not stated as uncontrolled(250.40) 02/26/2012  . S/P kidney transplant   . H/O steroid therapy   . Anemia   . Hyperlipidemia   . Hypertension   . Aspirin allergy   . Hx of CABG   . Ejection fraction   . Anemia   . GI bleed 11/26/2010  . Pneumonia 08/20/2010  . FOOT PAIN 01/20/2010  . THROMBOCYTOPENIA 01/11/2009  . 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 GOUT 08/12/2007  . Asthma 08/12/2007  . MENOPAUSAL SYNDROME 08/12/2007  . OSTEOPOROSIS 08/12/2007  . VERTIGO 08/12/2007      Current Outpatient Medications  Medication Sig Dispense Refill  . albuterol (PROVENTIL) (2.5 MG/3ML) 0.083% nebulizer solution Inhale 2.5 mg into the  lungs every 6 (six) hours as needed for wheezing.    Marland Kitchen amLODipine (NORVASC) 10 MG tablet Take 10 mg by mouth every morning.     Marland Kitchen aspirin EC 81 MG tablet Take 81 mg by mouth daily.    Marland Kitchen azaTHIOprine (IMURAN) 50 MG tablet Take 50 mg by mouth daily.    . Azelastine HCl 0.15 % SOLN Place 1 spray into the nose 2 (two) times daily.    . calcitRIOL (ROCALTROL) 0.25 MCG capsule Take 0.25 mcg by mouth daily. Alternate 2 tablets one day, 3 tablets the next day.    . fluticasone (FLONASE) 50 MCG/ACT nasal spray Place 1 spray into both nostrils 2 (two) times daily.    . fluticasone furoate-vilanterol (BREO ELLIPTA) 200-25 MCG/INH AEPB Inhale 1 puff into the lungs daily.    Marland Kitchen glipiZIDE (GLUCOTROL) 5 MG tablet  Take 5 mg by mouth daily before breakfast.     . nitroGLYCERIN (NITROSTAT) 0.4 MG SL tablet Place 1 tablet (0.4 mg total) under the tongue every 5 (five) minutes as needed for chest pain. 25 tablet 6  . omeprazole (PRILOSEC) 20 MG capsule Take 20 mg by mouth daily.     . predniSONE (DELTASONE) 5 MG tablet Take 1 tablet (5 mg total) by mouth daily.    . tacrolimus (PROGRAF) 1 MG capsule Take 5 mg by mouth every 12 (twelve) hours. Pt takes medication at 9 am & 9 pm    . Tiotropium Bromide Monohydrate (SPIRIVA RESPIMAT) 1.25 MCG/ACT AERS Inhale 1 puff into the lungs daily.    Marland Kitchen triamterene-hydrochlorothiazide (MAXZIDE-25) 37.5-25 MG tablet Take 1 tablet by mouth daily. 30 tablet 0  . rosuvastatin (CRESTOR) 10 MG tablet Take 1 tablet (10 mg total) by mouth daily. 90 tablet 3   No current facility-administered medications for this visit.     Allergies:   Lactose; Asa arthritis strength-antacid [aspirin buffered]; Aspirin; Atorvastatin; Banana; Lactose intolerance (gi); and Lisinopril    Social History:  The patient  reports that she quit smoking about 34 years ago. Her smoking use included cigarettes. She has a 8.00 pack-year smoking history. She has never used smokeless tobacco. She reports that she does  not drink alcohol or use drugs.   Family History:  The patient's family history includes Breast cancer in her sister.    ROS:  Please see the history of present illness.      Patient denies fever, chills, headache, sweats, rash, change in vision, change in hearing, chest pain, cough, nausea or vomiting, urinary symptoms. All other systems are reviewed and are negative.   Physical Exam: Blood pressure (!) 164/76, pulse (!) 109, height 5\' 2"  (1.575 m), weight 148 lb 12.8 oz (67.5 kg), SpO2 (!) 81 %.  GEN:  Well nourished, well developed in no acute distress HEENT: Normal NECK: No JVD; No carotid bruits LYMPHATICS: No lymphadenopathy CARDIAC: RRR  Tachycardic  RESPIRATORY:   No wheezing,  Reduced breath sounds   ABDOMEN: Soft, non-tender, non-distended MUSCULOSKELETAL:  No edema; No deformity  SKIN: Warm and dry NEUROLOGIC:  Alert and oriented x 3   EKG:   02/13/18   :    NSR at 98.  No ST or T wave changes   Recent Labs: 02/12/2018: B Natriuretic Peptide 91.5 02/21/2018: BUN 31; Creatinine, Ser 1.33; Hemoglobin 12.7; Magnesium 2.5; Platelets 143; Potassium 4.7; Sodium 132    Lipid Panel    Component Value Date/Time   CHOL 182 02/14/2018 0223   TRIG 102 02/14/2018 0223   HDL 69 02/14/2018 0223   CHOLHDL 2.6 02/14/2018 0223   VLDL 20 02/14/2018 0223   LDLCALC 93 02/14/2018 0223   LDLDIRECT 136.3 02/26/2012 1715      Wt Readings from Last 3 Encounters:  03/04/18 148 lb 12.8 oz (67.5 kg)  02/28/18 149 lb 3.2 oz (67.7 kg)  02/21/18 165 lb 12.6 oz (75.2 kg)      Current medicines are reviewed   Patient understands her medications well.     ASSESSMENT AND PLAN:  1. CAD - CABG , 2001 -   .  2. Shortness breath with exertion/fatigue:   Has asthma.   Was hypokic on arrival. Was given home O2 but has not received the O2 yet  Her oxygen saturation was 81% when she arrived at the office today.  With home O2 2 L/min her O2 sat got  up to 97.   3. ESRD - s/p renal  transplant - Sept. 2012  - managed by [facility]   4.  Hyperlipidemia: Her last LDL level is only 3.  She has a history of coronary artery disease her goal is 33.  She has previously been on 10 mg of Crestor and seemed to tolerate it fairly well.  We will increase her Crestor back up to 10 mg a day and recheck fasting labs in 3 months.  5. DM     Mertie Moores, MD  03/04/2018 9:14 AM    [address],  [address] [place], [place]  [address] Pager [phone] Phone: [phone]; Fax: [phone]

## 2018-03-04 NOTE — Telephone Encounter (Signed)
Referral was faxed to [facility]

## 2018-03-04 NOTE — Patient Instructions (Signed)
Medication Instructions:  Your physician has recommended you make the following change in your medication:  INCREASE Crestor (Rosuvastatin) to 10 mg daily   Labwork: Your physician recommends that you return for lab work in: 3 months  You will need to FAST for this appointment - nothing to eat or drink after midnight the night before except water.   Testing/Procedures: None Ordered   Follow-Up: Your physician wants you to follow-up in: 1 year with Dr. Acie Fredrickson.  You will receive a reminder letter in the mail two months in advance. If you don't receive a letter, please call our office to schedule the follow-up appointment.   If you need a refill on your cardiac medications before your next appointment, please call your pharmacy.   Thank you for choosing [facility]! Christen Bame, RN [phone]

## 2018-03-04 NOTE — Telephone Encounter (Signed)
please call [facility]: Dr Acie Fredrickson found her oxygen to be lower.  We think she now meets criteria.  Please start O2.  Thank you.

## 2018-03-05 ENCOUNTER — Telehealth: Payer: Self-pay | Admitting: Cardiovascular Disease

## 2018-03-05 DIAGNOSIS — E1122 Type 2 diabetes mellitus with diabetic chronic kidney disease: Secondary | ICD-10-CM | POA: Diagnosis not present

## 2018-03-05 DIAGNOSIS — I2510 Atherosclerotic heart disease of native coronary artery without angina pectoris: Secondary | ICD-10-CM | POA: Diagnosis not present

## 2018-03-05 DIAGNOSIS — J4551 Severe persistent asthma with (acute) exacerbation: Secondary | ICD-10-CM | POA: Diagnosis not present

## 2018-03-05 DIAGNOSIS — N183 Chronic kidney disease, stage 3 (moderate): Secondary | ICD-10-CM | POA: Diagnosis not present

## 2018-03-05 DIAGNOSIS — I252 Old myocardial infarction: Secondary | ICD-10-CM | POA: Diagnosis not present

## 2018-03-05 DIAGNOSIS — I129 Hypertensive chronic kidney disease with stage 1 through stage 4 chronic kidney disease, or unspecified chronic kidney disease: Secondary | ICD-10-CM | POA: Diagnosis not present

## 2018-03-05 NOTE — Telephone Encounter (Signed)
Left message for call back on the voice mail of [name] which was the extension I was given to call

## 2018-03-05 NOTE — Telephone Encounter (Signed)
Error closing out this encounter.

## 2018-03-05 NOTE — Telephone Encounter (Signed)
I have sent paperwork sent over from [place] with Cardiologist OV notes from yesterday.

## 2018-03-05 NOTE — Telephone Encounter (Signed)
New Message  [name] at [facility], trying to get orders for oxygen and get the saturations that were taken at previous ov. Please call

## 2018-03-06 DIAGNOSIS — J4551 Severe persistent asthma with (acute) exacerbation: Secondary | ICD-10-CM | POA: Diagnosis not present

## 2018-03-06 DIAGNOSIS — N183 Chronic kidney disease, stage 3 (moderate): Secondary | ICD-10-CM | POA: Diagnosis not present

## 2018-03-06 DIAGNOSIS — I129 Hypertensive chronic kidney disease with stage 1 through stage 4 chronic kidney disease, or unspecified chronic kidney disease: Secondary | ICD-10-CM | POA: Diagnosis not present

## 2018-03-06 DIAGNOSIS — I252 Old myocardial infarction: Secondary | ICD-10-CM | POA: Diagnosis not present

## 2018-03-06 DIAGNOSIS — E1122 Type 2 diabetes mellitus with diabetic chronic kidney disease: Secondary | ICD-10-CM | POA: Diagnosis not present

## 2018-03-06 DIAGNOSIS — I2510 Atherosclerotic heart disease of native coronary artery without angina pectoris: Secondary | ICD-10-CM | POA: Diagnosis not present

## 2018-03-07 DIAGNOSIS — Z9483 Pancreas transplant status: Secondary | ICD-10-CM | POA: Diagnosis not present

## 2018-03-07 DIAGNOSIS — I2510 Atherosclerotic heart disease of native coronary artery without angina pectoris: Secondary | ICD-10-CM | POA: Diagnosis not present

## 2018-03-07 DIAGNOSIS — Z940 Kidney transplant status: Secondary | ICD-10-CM | POA: Diagnosis not present

## 2018-03-07 DIAGNOSIS — Z5181 Encounter for therapeutic drug level monitoring: Secondary | ICD-10-CM | POA: Diagnosis not present

## 2018-03-07 DIAGNOSIS — E1122 Type 2 diabetes mellitus with diabetic chronic kidney disease: Secondary | ICD-10-CM | POA: Diagnosis not present

## 2018-03-07 DIAGNOSIS — J4551 Severe persistent asthma with (acute) exacerbation: Secondary | ICD-10-CM | POA: Diagnosis not present

## 2018-03-07 DIAGNOSIS — N183 Chronic kidney disease, stage 3 (moderate): Secondary | ICD-10-CM | POA: Diagnosis not present

## 2018-03-07 DIAGNOSIS — I252 Old myocardial infarction: Secondary | ICD-10-CM | POA: Diagnosis not present

## 2018-03-07 DIAGNOSIS — I129 Hypertensive chronic kidney disease with stage 1 through stage 4 chronic kidney disease, or unspecified chronic kidney disease: Secondary | ICD-10-CM | POA: Diagnosis not present

## 2018-03-08 DIAGNOSIS — I2510 Atherosclerotic heart disease of native coronary artery without angina pectoris: Secondary | ICD-10-CM | POA: Diagnosis not present

## 2018-03-08 DIAGNOSIS — N183 Chronic kidney disease, stage 3 (moderate): Secondary | ICD-10-CM | POA: Diagnosis not present

## 2018-03-08 DIAGNOSIS — E1122 Type 2 diabetes mellitus with diabetic chronic kidney disease: Secondary | ICD-10-CM | POA: Diagnosis not present

## 2018-03-08 DIAGNOSIS — I129 Hypertensive chronic kidney disease with stage 1 through stage 4 chronic kidney disease, or unspecified chronic kidney disease: Secondary | ICD-10-CM | POA: Diagnosis not present

## 2018-03-08 DIAGNOSIS — J4551 Severe persistent asthma with (acute) exacerbation: Secondary | ICD-10-CM | POA: Diagnosis not present

## 2018-03-08 DIAGNOSIS — I252 Old myocardial infarction: Secondary | ICD-10-CM | POA: Diagnosis not present

## 2018-03-11 DIAGNOSIS — E1122 Type 2 diabetes mellitus with diabetic chronic kidney disease: Secondary | ICD-10-CM | POA: Diagnosis not present

## 2018-03-11 DIAGNOSIS — J4551 Severe persistent asthma with (acute) exacerbation: Secondary | ICD-10-CM | POA: Diagnosis not present

## 2018-03-11 DIAGNOSIS — N183 Chronic kidney disease, stage 3 (moderate): Secondary | ICD-10-CM | POA: Diagnosis not present

## 2018-03-11 DIAGNOSIS — I129 Hypertensive chronic kidney disease with stage 1 through stage 4 chronic kidney disease, or unspecified chronic kidney disease: Secondary | ICD-10-CM | POA: Diagnosis not present

## 2018-03-11 DIAGNOSIS — I2510 Atherosclerotic heart disease of native coronary artery without angina pectoris: Secondary | ICD-10-CM | POA: Diagnosis not present

## 2018-03-11 DIAGNOSIS — I252 Old myocardial infarction: Secondary | ICD-10-CM | POA: Diagnosis not present

## 2018-03-12 DIAGNOSIS — J4551 Severe persistent asthma with (acute) exacerbation: Secondary | ICD-10-CM | POA: Diagnosis not present

## 2018-03-12 DIAGNOSIS — R0602 Shortness of breath: Secondary | ICD-10-CM | POA: Diagnosis not present

## 2018-03-12 DIAGNOSIS — Z940 Kidney transplant status: Secondary | ICD-10-CM | POA: Diagnosis not present

## 2018-03-13 DIAGNOSIS — I252 Old myocardial infarction: Secondary | ICD-10-CM | POA: Diagnosis not present

## 2018-03-13 DIAGNOSIS — N183 Chronic kidney disease, stage 3 (moderate): Secondary | ICD-10-CM | POA: Diagnosis not present

## 2018-03-13 DIAGNOSIS — I129 Hypertensive chronic kidney disease with stage 1 through stage 4 chronic kidney disease, or unspecified chronic kidney disease: Secondary | ICD-10-CM | POA: Diagnosis not present

## 2018-03-13 DIAGNOSIS — J4551 Severe persistent asthma with (acute) exacerbation: Secondary | ICD-10-CM | POA: Diagnosis not present

## 2018-03-13 DIAGNOSIS — I2510 Atherosclerotic heart disease of native coronary artery without angina pectoris: Secondary | ICD-10-CM | POA: Diagnosis not present

## 2018-03-13 DIAGNOSIS — E1122 Type 2 diabetes mellitus with diabetic chronic kidney disease: Secondary | ICD-10-CM | POA: Diagnosis not present

## 2018-03-14 DIAGNOSIS — I2510 Atherosclerotic heart disease of native coronary artery without angina pectoris: Secondary | ICD-10-CM | POA: Diagnosis not present

## 2018-03-14 DIAGNOSIS — N183 Chronic kidney disease, stage 3 (moderate): Secondary | ICD-10-CM | POA: Diagnosis not present

## 2018-03-14 DIAGNOSIS — J4551 Severe persistent asthma with (acute) exacerbation: Secondary | ICD-10-CM | POA: Diagnosis not present

## 2018-03-14 DIAGNOSIS — I129 Hypertensive chronic kidney disease with stage 1 through stage 4 chronic kidney disease, or unspecified chronic kidney disease: Secondary | ICD-10-CM | POA: Diagnosis not present

## 2018-03-14 DIAGNOSIS — E1122 Type 2 diabetes mellitus with diabetic chronic kidney disease: Secondary | ICD-10-CM | POA: Diagnosis not present

## 2018-03-14 DIAGNOSIS — I252 Old myocardial infarction: Secondary | ICD-10-CM | POA: Diagnosis not present

## 2018-03-16 ENCOUNTER — Other Ambulatory Visit: Payer: Self-pay | Admitting: Cardiovascular Disease

## 2018-03-18 DIAGNOSIS — N183 Chronic kidney disease, stage 3 (moderate): Secondary | ICD-10-CM | POA: Diagnosis not present

## 2018-03-18 DIAGNOSIS — I252 Old myocardial infarction: Secondary | ICD-10-CM | POA: Diagnosis not present

## 2018-03-18 DIAGNOSIS — J4551 Severe persistent asthma with (acute) exacerbation: Secondary | ICD-10-CM | POA: Diagnosis not present

## 2018-03-18 DIAGNOSIS — I2510 Atherosclerotic heart disease of native coronary artery without angina pectoris: Secondary | ICD-10-CM | POA: Diagnosis not present

## 2018-03-18 DIAGNOSIS — E1122 Type 2 diabetes mellitus with diabetic chronic kidney disease: Secondary | ICD-10-CM | POA: Diagnosis not present

## 2018-03-18 DIAGNOSIS — I129 Hypertensive chronic kidney disease with stage 1 through stage 4 chronic kidney disease, or unspecified chronic kidney disease: Secondary | ICD-10-CM | POA: Diagnosis not present

## 2018-03-19 DIAGNOSIS — N183 Chronic kidney disease, stage 3 (moderate): Secondary | ICD-10-CM | POA: Diagnosis not present

## 2018-03-19 DIAGNOSIS — I2510 Atherosclerotic heart disease of native coronary artery without angina pectoris: Secondary | ICD-10-CM | POA: Diagnosis not present

## 2018-03-19 DIAGNOSIS — I129 Hypertensive chronic kidney disease with stage 1 through stage 4 chronic kidney disease, or unspecified chronic kidney disease: Secondary | ICD-10-CM | POA: Diagnosis not present

## 2018-03-19 DIAGNOSIS — J4551 Severe persistent asthma with (acute) exacerbation: Secondary | ICD-10-CM | POA: Diagnosis not present

## 2018-03-19 DIAGNOSIS — E1122 Type 2 diabetes mellitus with diabetic chronic kidney disease: Secondary | ICD-10-CM | POA: Diagnosis not present

## 2018-03-19 DIAGNOSIS — I252 Old myocardial infarction: Secondary | ICD-10-CM | POA: Diagnosis not present

## 2018-03-19 NOTE — Telephone Encounter (Signed)
Outpatient Medication Detail    Disp Refills Start End   rosuvastatin (CRESTOR) 10 MG tablet 90 tablet 3 03/04/2018 06/02/2018   Sig - Route: Take 1 tablet (10 mg total) by mouth daily. - Oral   Sent to pharmacy as: rosuvastatin (CRESTOR) 10 MG tablet   E-Prescribing Status: Receipt confirmed by pharmacy (03/04/2018 9:06 AM EDT)   Pharmacy   [id number] - , [place] - [address]

## 2018-03-20 ENCOUNTER — Ambulatory Visit: Payer: Medicare Other | Admitting: Physician Assistant

## 2018-03-21 DIAGNOSIS — I2510 Atherosclerotic heart disease of native coronary artery without angina pectoris: Secondary | ICD-10-CM | POA: Diagnosis not present

## 2018-03-21 DIAGNOSIS — J4551 Severe persistent asthma with (acute) exacerbation: Secondary | ICD-10-CM | POA: Diagnosis not present

## 2018-03-21 DIAGNOSIS — I252 Old myocardial infarction: Secondary | ICD-10-CM | POA: Diagnosis not present

## 2018-03-21 DIAGNOSIS — I129 Hypertensive chronic kidney disease with stage 1 through stage 4 chronic kidney disease, or unspecified chronic kidney disease: Secondary | ICD-10-CM | POA: Diagnosis not present

## 2018-03-21 DIAGNOSIS — N183 Chronic kidney disease, stage 3 (moderate): Secondary | ICD-10-CM | POA: Diagnosis not present

## 2018-03-21 DIAGNOSIS — E1122 Type 2 diabetes mellitus with diabetic chronic kidney disease: Secondary | ICD-10-CM | POA: Diagnosis not present

## 2018-03-26 ENCOUNTER — Other Ambulatory Visit: Payer: Self-pay

## 2018-03-26 DIAGNOSIS — J4551 Severe persistent asthma with (acute) exacerbation: Secondary | ICD-10-CM | POA: Diagnosis not present

## 2018-03-26 DIAGNOSIS — R0602 Shortness of breath: Secondary | ICD-10-CM | POA: Diagnosis not present

## 2018-03-26 DIAGNOSIS — Z9981 Dependence on supplemental oxygen: Secondary | ICD-10-CM | POA: Diagnosis not present

## 2018-03-26 DIAGNOSIS — J432 Centrilobular emphysema: Secondary | ICD-10-CM | POA: Diagnosis not present

## 2018-03-26 MED ORDER — ROSUVASTATIN CALCIUM 10 MG PO TABS
10.0000 mg | ORAL_TABLET | Freq: Every day | ORAL | 3 refills | Status: DC
Start: 2018-03-26 — End: 2019-02-21

## 2018-04-01 DIAGNOSIS — D899 Disorder involving the immune mechanism, unspecified: Secondary | ICD-10-CM | POA: Diagnosis not present

## 2018-04-01 DIAGNOSIS — Z940 Kidney transplant status: Secondary | ICD-10-CM | POA: Diagnosis not present

## 2018-04-01 DIAGNOSIS — E119 Type 2 diabetes mellitus without complications: Secondary | ICD-10-CM | POA: Diagnosis not present

## 2018-04-02 ENCOUNTER — Ambulatory Visit
Admission: RE | Admit: 2018-04-02 | Discharge: 2018-04-02 | Disposition: A | Discharge status: Home / Self Care | Payer: Medicare Other | Source: Ambulatory Visit | Attending: Endocrinology | Admitting: Endocrinology

## 2018-04-02 DIAGNOSIS — Z1231 Encounter for screening mammogram for malignant neoplasm of breast: Secondary | ICD-10-CM | POA: Diagnosis not present

## 2018-04-04 ENCOUNTER — Ambulatory Visit (INDEPENDENT_AMBULATORY_CARE_PROVIDER_SITE_OTHER): Payer: Medicare Other | Admitting: Endocrinology

## 2018-04-04 ENCOUNTER — Encounter: Payer: Self-pay | Admitting: Endocrinology

## 2018-04-04 VITALS — BP 122/56 | HR 94 | Ht 62.0 in | Wt 152.0 lb

## 2018-04-04 DIAGNOSIS — Z794 Long term (current) use of insulin: Secondary | ICD-10-CM

## 2018-04-04 DIAGNOSIS — N183 Chronic kidney disease, stage 3 unspecified: Secondary | ICD-10-CM

## 2018-04-04 DIAGNOSIS — G8929 Other chronic pain: Secondary | ICD-10-CM | POA: Diagnosis not present

## 2018-04-04 DIAGNOSIS — M810 Age-related osteoporosis without current pathological fracture: Secondary | ICD-10-CM | POA: Diagnosis not present

## 2018-04-04 DIAGNOSIS — M25511 Pain in right shoulder: Secondary | ICD-10-CM | POA: Diagnosis not present

## 2018-04-04 DIAGNOSIS — E1122 Type 2 diabetes mellitus with diabetic chronic kidney disease: Secondary | ICD-10-CM

## 2018-04-04 DIAGNOSIS — I2510 Atherosclerotic heart disease of native coronary artery without angina pectoris: Secondary | ICD-10-CM

## 2018-04-04 LAB — MICROALBUMIN / CREATININE URINE RATIO
CREATININE, U: 91.7 mg/dL
MICROALB UR: 20.6 mg/dL — AB (ref 0.0–1.9)
Microalb Creat Ratio: 22.5 mg/g (ref 0.0–30.0)

## 2018-04-04 MED ORDER — GLIPIZIDE 5 MG PO TABS: 2.5000 mg | ORAL_TABLET | Freq: Every day | ORAL | 1 refills | Status: DC

## 2018-04-04 NOTE — Progress Notes (Signed)
Subjective:    Patient ID: [name], female    DOB: 1941/01/26, 77 y.o.   MRN: [id number]  HPI Pt returns for f/u of diabetes mellitus: DM type: 2 Dx'ed: 6378 Complications: CAD and renal failure Therapy: insulin since GDM: never DKA: never Severe hypoglycemia: never Pancreatitis: never Pancreatic imaging: normal on 2015 CT Other: she has never been on insulin; she intermittently takes steroids for COPD Interval history: Pt states right shoulder pain is worse--now mod to severe.  No assoc numbness.   By tomorrow, she will finish prednisone for COPD.   cbg's are now approx 100.   Past Medical History:  Diagnosis Date  . 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 Asthma   . Coronary artery disease    cath 11/2007, grafts patent /  Nuclear, June, 2011, prior inferior MI with mild peri-infarct ischemia, anterior breast attenuation, EF 67%, done for renal transplant assessment / Low level exercise/Lexiscan Myoview (11/2013): EF 67%, no ischemi; normal study  . Diabetes mellitus    type 2  . ESRD on dialysis [facility])    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, 2012  . GERD (gastroesophageal reflux disease)   . GI bleed 11/26/2010   January, 2012 , AVM  . Gout   . History of methicillin resistant staphylococcus aureus (MRSA)   . Hyperlipidemia   . Hyperparathyroidism   . Hypertension   . Myocardial infarction ([place])   . Osteoporosis   . Pneumonia 08/2010    Hospitalization, October, 2011  . Primary osteoarthritis, 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  . S/P kidney transplant    September, 2012, [facility]  . Subacromial impingement of 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    Past Surgical History:  Procedure Laterality Date  . ABDOMINAL HYSTERECTOMY  1980'S  . ARTERIOVENOUS GRAFT PLACEMENT Left 03/04/2007   forearm  . BREAST EXCISIONAL BIOPSY    . BREAST SURGERY  YRS AGO   RT BREAST CYST REMOVED   . CARDIAC CATHETERIZATION  06/03/2002;  12/04/2007  . COLONOSCOPY WITH PROPOFOL N/A 07/14/2014   Procedure: COLONOSCOPY WITH PROPOFOL;  Surgeon: Lear Ng, MD;  [facility];  Service: Endoscopy;  Laterality: N/A;  . CORONARY ARTERY BYPASS GRAFT  06/06/2002   x 4  . HOT HEMOSTASIS N/A 07/14/2014   Procedure: HOT HEMOSTASIS (ARGON PLASMA COAGULATION/BICAP);  Surgeon: Lear Ng, MD;  Location: [facility];  Service: Endoscopy;  Laterality: N/A;  . KIDNEY TRANSPLANT Right 08/04/2011  . ORIF PATELLA Left 04/06/2016   Procedure: OPEN REDUCTION INTERNAL (ORIF) LEFT  PATELLA;  Surgeon: Ninetta Lights, MD;  Location: [place];  Service: Orthopedics;  Laterality: Left;  . SHOULDER ARTHROSCOPY WITH ROTATOR CUFF REPAIR AND SUBACROMIAL DECOMPRESSION Left 08/03/2016   Procedure: LEFT SHOULDER ARTHROSCOPY WITH ROTATOR CUFF REPAIR AND SUBACROMIAL DECOMPRESSION with distal claviculectomy and extentsive debridement;  Surgeon: Ninetta Lights, MD;  Location: [place];  Service: Orthopedics;  Laterality: Left;  Block  . THROMBECTOMY AND REVISION OF ARTERIOVENTOUS (AV) GORETEX  GRAFT Left 05/08/2007   forearm  . TOTAL HIP ARTHROPLASTY  12/18/2012   Procedure: TOTAL HIP ARTHROPLASTY;  Surgeon: Ninetta Lights, MD;  Location: [place];  Service: Orthopedics;  Laterality: Left;  Marland Kitchen VESICOVAGINAL FISTULA CLOSURE W/ TAH  1984    Social History   Socioeconomic History  . Marital status: Divorced    Spouse name: Not on file  . Number of  children: Not on file  . Years of education: Not on file  . Highest education level: Not on file  Occupational History  . Not on file  Social Needs  . Financial resource strain: Not on file  . Food insecurity:    Worry: Not on file    Inability: Not on file  . Transportation needs:    Medical: Not on file    Non-medical: Not on file  Tobacco Use  . Smoking status: Former Smoker    Packs/day: 1.00    Years: 8.00    Pack years: 8.00    Types: Cigarettes      Last attempt to quit: 11/21/1983    Years since quitting: 34.3  . Smokeless tobacco: Never Used  Substance and Sexual Activity  . Alcohol use: No  . Drug use: No  . Sexual activity: Not on file  Lifestyle  . Physical activity:    Days per week: Not on file    Minutes per session: Not on file  . Stress: Not on file  Relationships  . Social connections:    Talks on phone: Not on file    Gets together: Not on file    Attends religious service: Not on file    Active member of club or organization: Not on file    Attends meetings of clubs or organizations: Not on file    Relationship status: Not on file  . Intimate partner violence:    Fear of current or ex partner: Not on file    Emotionally abused: Not on file    Physically abused: Not on file    Forced sexual activity: Not on file  Other Topics Concern  . Not on file  Social History Narrative  . Not on file    Current Outpatient Medications on File Prior to Visit  Medication Sig Dispense Refill  . albuterol (PROVENTIL) (2.5 MG/3ML) 0.083% nebulizer solution Inhale 2.5 mg into the lungs every 6 (six) hours as needed for wheezing.    Marland Kitchen amLODipine (NORVASC) 10 MG tablet Take 10 mg by mouth every morning.     Marland Kitchen aspirin EC 81 MG tablet Take 81 mg by mouth daily.    Marland Kitchen azaTHIOprine (IMURAN) 50 MG tablet Take 50 mg by mouth daily.    . Azelastine HCl 0.15 % SOLN Place 1 spray into the nose 2 (two) times daily.    . calcitRIOL (ROCALTROL) 0.25 MCG capsule Take 0.25 mcg by mouth daily. Alternate 2 tablets one day, 3 tablets the next day.    . fluticasone (FLONASE) 50 MCG/ACT nasal spray Place 1 spray into both nostrils 2 (two) times daily.    . fluticasone furoate-vilanterol (BREO ELLIPTA) 200-25 MCG/INH AEPB Inhale 1 puff into the lungs daily.    . nitroGLYCERIN (NITROSTAT) 0.4 MG SL tablet Place 1 tablet (0.4 mg total) under the tongue every 5 (five) minutes as needed for chest pain. 25 tablet 6  . omeprazole (PRILOSEC) 20 MG  capsule Take 20 mg by mouth daily.     . predniSONE (DELTASONE) 5 MG tablet Take 1 tablet (5 mg total) by mouth daily.    . rosuvastatin (CRESTOR) 10 MG tablet Take 1 tablet (10 mg total) by mouth daily. 90 tablet 3  . tacrolimus (PROGRAF) 1 MG capsule Take 5 mg by mouth every 12 (twelve) hours. Pt takes medication at 9 am & 9 pm    . Tiotropium Bromide Monohydrate (SPIRIVA RESPIMAT) 1.25 MCG/ACT AERS Inhale 1 puff into the lungs  daily.    . triamterene-hydrochlorothiazide (MAXZIDE-25) 37.5-25 MG tablet Take 1 tablet by mouth daily. 30 tablet 0   No current facility-administered medications on file prior to visit.     Allergies  Allergen Reactions  . Lactose Other (See Comments)  . Asa Arthritis Strength-Antacid [Aspirin Buffered] Other (See Comments)    STOMACH BURNS, N/V   . Aspirin Nausea And Vomiting    Per pt. "can tolerate the enteric coated tablets".   . Atorvastatin Other (See Comments)    Patient's skin was skin was sensitive  . Banana Nausea And Vomiting  . Lactose Intolerance (Gi) Nausea And Vomiting  . Lisinopril Cough    Family History  Problem Relation Age of Onset  . Breast cancer Sister   . Cancer Neg Hx     BP (!) 122/56   Pulse 94   Ht 5\' 2"  (1.575 m)   Wt 152 lb (68.9 kg)   SpO2 94%   BMI 27.80 kg/m   Review of Systems She denies hypoglycemia.     Objective:   Physical Exam VITAL SIGNS:  See vs page GENERAL: no distress.  In wheelchair Right shoulder: ROM is severely limited by pain.    Lab Results  Component Value Date   HGBA1C 5.7 (H) 02/13/2018   Lab Results  Component Value Date   CREATININE 1.33 (H) 02/21/2018   BUN 31 (H) 02/21/2018   NA 132 (L) 02/21/2018   K 4.7 02/21/2018   CL 96 (L) 02/21/2018   CO2 24 02/21/2018       Assessment & Plan:  Type 2 DM: overcontrolled, for this SU-containing regimen. Renal insuff: this limits rx options.  Shoulder pain: ref ortho.   Patient Instructions  check your blood sugar once a day.   vary the time of day when you check, between before the 3 meals, and at bedtime.  also check if you have symptoms of your blood sugar being too high or too low.  please keep a record of the readings and bring it to your next appointment here (or you can bring the meter itself).  You can write it on any piece of paper.  please call us sooner if your blood sugar goes below 70, or if you have a lot of readings over 200.   Please reduce the glipizide to 1/2 pill per day.   Please come back for a follow-up appointment in 2 months.

## 2018-04-04 NOTE — Patient Instructions (Addendum)
check your blood sugar once a day.  vary the time of day when you check, between before the 3 meals, and at bedtime.  also check if you have symptoms of your blood sugar being too high or too low.  please keep a record of the readings and bring it to your next appointment here (or you can bring the meter itself).  You can write it on any piece of paper.  please call us sooner if your blood sugar goes below 70, or if you have a lot of readings over 200.   Please reduce the glipizide to 1/2 pill per day.   Please come back for a follow-up appointment in 2 months.

## 2018-04-08 DIAGNOSIS — M25511 Pain in right shoulder: Secondary | ICD-10-CM | POA: Diagnosis not present

## 2018-04-08 DIAGNOSIS — M542 Cervicalgia: Secondary | ICD-10-CM | POA: Diagnosis not present

## 2018-04-09 DIAGNOSIS — Z940 Kidney transplant status: Secondary | ICD-10-CM | POA: Diagnosis not present

## 2018-04-09 DIAGNOSIS — Z5181 Encounter for therapeutic drug level monitoring: Secondary | ICD-10-CM | POA: Diagnosis not present

## 2018-04-18 ENCOUNTER — Ambulatory Visit (INDEPENDENT_AMBULATORY_CARE_PROVIDER_SITE_OTHER)
Admission: RE | Admit: 2018-04-18 | Discharge: 2018-04-18 | Disposition: A | Discharge status: Home / Self Care | Payer: Medicare Other | Source: Ambulatory Visit | Attending: Endocrinology | Admitting: Endocrinology

## 2018-04-18 DIAGNOSIS — M810 Age-related osteoporosis without current pathological fracture: Secondary | ICD-10-CM

## 2018-04-25 DIAGNOSIS — R0609 Other forms of dyspnea: Secondary | ICD-10-CM | POA: Diagnosis not present

## 2018-04-25 DIAGNOSIS — J432 Centrilobular emphysema: Secondary | ICD-10-CM | POA: Diagnosis not present

## 2018-04-25 DIAGNOSIS — R0602 Shortness of breath: Secondary | ICD-10-CM | POA: Diagnosis not present

## 2018-04-25 DIAGNOSIS — J398 Other specified diseases of upper respiratory tract: Secondary | ICD-10-CM | POA: Diagnosis not present

## 2018-04-25 DIAGNOSIS — J449 Chronic obstructive pulmonary disease, unspecified: Secondary | ICD-10-CM | POA: Diagnosis not present

## 2018-04-25 DIAGNOSIS — J4540 Moderate persistent asthma, uncomplicated: Secondary | ICD-10-CM | POA: Diagnosis not present

## 2018-05-07 DIAGNOSIS — E1122 Type 2 diabetes mellitus with diabetic chronic kidney disease: Secondary | ICD-10-CM | POA: Diagnosis not present

## 2018-05-07 DIAGNOSIS — M109 Gout, unspecified: Secondary | ICD-10-CM | POA: Diagnosis not present

## 2018-05-07 DIAGNOSIS — I129 Hypertensive chronic kidney disease with stage 1 through stage 4 chronic kidney disease, or unspecified chronic kidney disease: Secondary | ICD-10-CM | POA: Diagnosis not present

## 2018-05-07 DIAGNOSIS — E785 Hyperlipidemia, unspecified: Secondary | ICD-10-CM | POA: Diagnosis not present

## 2018-05-07 DIAGNOSIS — N183 Chronic kidney disease, stage 3 (moderate): Secondary | ICD-10-CM | POA: Diagnosis not present

## 2018-05-07 DIAGNOSIS — Z940 Kidney transplant status: Secondary | ICD-10-CM | POA: Diagnosis not present

## 2018-05-07 DIAGNOSIS — I25810 Atherosclerosis of coronary artery bypass graft(s) without angina pectoris: Secondary | ICD-10-CM | POA: Diagnosis not present

## 2018-05-07 DIAGNOSIS — K31811 Angiodysplasia of stomach and duodenum with bleeding: Secondary | ICD-10-CM | POA: Diagnosis not present

## 2018-05-07 DIAGNOSIS — N2581 Secondary hyperparathyroidism of renal origin: Secondary | ICD-10-CM | POA: Diagnosis not present

## 2018-05-07 DIAGNOSIS — D631 Anemia in chronic kidney disease: Secondary | ICD-10-CM | POA: Diagnosis not present

## 2018-05-07 DIAGNOSIS — K210 Gastro-esophageal reflux disease with esophagitis: Secondary | ICD-10-CM | POA: Diagnosis not present

## 2018-05-10 DIAGNOSIS — Z5181 Encounter for therapeutic drug level monitoring: Secondary | ICD-10-CM | POA: Diagnosis not present

## 2018-05-10 DIAGNOSIS — Z9483 Pancreas transplant status: Secondary | ICD-10-CM | POA: Diagnosis not present

## 2018-05-10 DIAGNOSIS — Z940 Kidney transplant status: Secondary | ICD-10-CM | POA: Diagnosis not present

## 2018-05-17 DIAGNOSIS — D631 Anemia in chronic kidney disease: Secondary | ICD-10-CM | POA: Diagnosis not present

## 2018-05-17 DIAGNOSIS — Z940 Kidney transplant status: Secondary | ICD-10-CM | POA: Diagnosis not present

## 2018-05-24 DIAGNOSIS — Z940 Kidney transplant status: Secondary | ICD-10-CM | POA: Diagnosis not present

## 2018-05-24 DIAGNOSIS — Z5181 Encounter for therapeutic drug level monitoring: Secondary | ICD-10-CM | POA: Diagnosis not present

## 2018-05-24 DIAGNOSIS — Z9483 Pancreas transplant status: Secondary | ICD-10-CM | POA: Diagnosis not present

## 2018-06-04 ENCOUNTER — Telehealth (HOSPITAL_COMMUNITY): Payer: Self-pay

## 2018-06-04 DIAGNOSIS — R0609 Other forms of dyspnea: Secondary | ICD-10-CM | POA: Diagnosis not present

## 2018-06-04 NOTE — Telephone Encounter (Signed)
Referral created, insurance benefits and eligibility TBD.

## 2018-06-05 ENCOUNTER — Other Ambulatory Visit: Payer: Medicare Other | Admitting: *Deleted

## 2018-06-05 DIAGNOSIS — E782 Mixed hyperlipidemia: Secondary | ICD-10-CM | POA: Diagnosis not present

## 2018-06-05 DIAGNOSIS — I2510 Atherosclerotic heart disease of native coronary artery without angina pectoris: Secondary | ICD-10-CM

## 2018-06-05 LAB — BASIC METABOLIC PANEL
BUN/Creatinine Ratio: 13 (ref 12–28)
BUN: 14 mg/dL (ref 8–27)
CO2: 25 mmol/L (ref 20–29)
Calcium: 9.4 mg/dL (ref 8.7–10.3)
Chloride: 102 mmol/L (ref 96–106)
Creatinine, Ser: 1.05 mg/dL — ABNORMAL HIGH (ref 0.57–1.00)
GFR calc Af Amer: 60 mL/min/{1.73_m2} (ref >59)
GFR calc non Af Amer: 52 mL/min/{1.73_m2} — ABNORMAL LOW (ref >59)
Glucose: 77 mg/dL (ref 65–99)
POTASSIUM: 3.5 mmol/L (ref 3.5–5.2)
SODIUM: 143 mmol/L (ref 134–144)

## 2018-06-05 LAB — LIPID PANEL
CHOL/HDL RATIO: 2.4 ratio (ref 0.0–4.4)
CHOLESTEROL TOTAL: 171 mg/dL (ref 100–199)
HDL: 71 mg/dL (ref >39)
LDL CALC: 75 mg/dL (ref 0–99)
TRIGLYCERIDES: 125 mg/dL (ref 0–149)
VLDL Cholesterol Cal: 25 mg/dL (ref 5–40)

## 2018-06-05 LAB — HEPATIC FUNCTION PANEL
ALBUMIN: 4.4 g/dL (ref 3.5–4.8)
ALT: 8 IU/L (ref 0–32)
AST: 17 IU/L (ref 0–40)
Alkaline Phosphatase: 55 IU/L (ref 39–117)
Bilirubin Total: 0.4 mg/dL (ref 0.0–1.2)
Bilirubin, Direct: 0.13 mg/dL (ref 0.00–0.40)
Total Protein: 6.6 g/dL (ref 6.0–8.5)

## 2018-06-06 ENCOUNTER — Telehealth (HOSPITAL_COMMUNITY): Payer: Self-pay

## 2018-06-06 NOTE — Telephone Encounter (Signed)
Attempted to call patient in regards to Pulmonary Rehab - LM on VM °

## 2018-06-11 ENCOUNTER — Telehealth (HOSPITAL_COMMUNITY): Payer: Self-pay

## 2018-06-11 NOTE — Telephone Encounter (Signed)
Called patient to see if she was interested in participating in the Pulmonary Rehab Program. Patient stated yes. Patient will come in for orientation on 07/01/18 @ 9:30AM and will attend the 10:30AM exercise class.  Mailed homework package.

## 2018-06-11 NOTE — Telephone Encounter (Signed)
Pt insurance is active and benefits verified through [employer]. Co-pay $0.00, DED $185.00/$185.00 met, out of pocket $0.00/$0.00 met, co-insurance 20%. No pre-authorization required. 06/06/18  Secondary  insurance is active and benefits verified through [employer]. No co-pay, co-insurance and OFP. 06/06/18

## 2018-06-12 ENCOUNTER — Encounter: Payer: Self-pay | Admitting: Endocrinology

## 2018-06-12 ENCOUNTER — Ambulatory Visit (INDEPENDENT_AMBULATORY_CARE_PROVIDER_SITE_OTHER): Payer: Medicare Other | Admitting: Endocrinology

## 2018-06-12 VITALS — BP 134/64 | HR 99 | Ht 62.0 in | Wt 154.8 lb

## 2018-06-12 DIAGNOSIS — N183 Chronic kidney disease, stage 3 (moderate): Secondary | ICD-10-CM | POA: Diagnosis not present

## 2018-06-12 DIAGNOSIS — E1122 Type 2 diabetes mellitus with diabetic chronic kidney disease: Secondary | ICD-10-CM | POA: Diagnosis not present

## 2018-06-12 DIAGNOSIS — I2510 Atherosclerotic heart disease of native coronary artery without angina pectoris: Secondary | ICD-10-CM | POA: Diagnosis not present

## 2018-06-12 DIAGNOSIS — Z794 Long term (current) use of insulin: Secondary | ICD-10-CM | POA: Diagnosis not present

## 2018-06-12 LAB — POCT GLYCOSYLATED HEMOGLOBIN (HGB A1C): HEMOGLOBIN A1C: 5.8 % — AB (ref 4.0–5.6)

## 2018-06-12 MED ORDER — GLIPIZIDE 5 MG PO TABS: 2.5000 mg | ORAL_TABLET | Freq: Every day | ORAL | 1 refills | Status: DC

## 2018-06-12 NOTE — Patient Instructions (Addendum)
check your blood sugar once a day.  vary the time of day when you check, between before the 3 meals, and at bedtime.  also check if you have symptoms of your blood sugar being too high or too low.  please keep a record of the readings and bring it to your next appointment here (or you can bring the meter itself).  You can write it on any piece of paper.  please call us sooner if your blood sugar goes below 70, or if you have a lot of readings over 200.   Please reduce the glipizide to 1/2 pill per day.   Please come back for a follow-up appointment in 3 months.

## 2018-06-12 NOTE — Progress Notes (Signed)
Subjective:    Patient ID: [name], female    DOB: 1941/06/26, 77 y.o.   MRN: [id number]  HPI Pt returns for f/u of diabetes mellitus: DM type: 2 Dx'ed: 9476 Complications: CAD and renal failure Therapy: glipizide GDM: never DKA: never Severe hypoglycemia: never Pancreatitis: never Pancreatic imaging: normal on 2015 CT Other: she has never been on insulin; she intermittently takes steroids for COPD Interval history: no recent steroids.  cbg's are now approx 100.  She still takes glipizide, 5 mg qd.  pt states she feels well in general. Past Medical History:  Diagnosis Date  . 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 Asthma   . Coronary artery disease    cath 11/2007, grafts patent /  Nuclear, June, 2011, prior inferior MI with mild peri-infarct ischemia, anterior breast attenuation, EF 67%, done for renal transplant assessment / Low level exercise/Lexiscan Myoview (11/2013): EF 67%, no ischemi; normal study  . Diabetes mellitus    type 2  . ESRD on dialysis [facility])    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, 2012  . GERD (gastroesophageal reflux disease)   . GI bleed 11/26/2010   January, 2012 , AVM  . Gout   . History of methicillin resistant staphylococcus aureus (MRSA)   . Hyperlipidemia   . Hyperparathyroidism   . Hypertension   . Myocardial infarction ([place])   . Osteoporosis   . Pneumonia 08/2010    Hospitalization, October, 2011  . Primary osteoarthritis, 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  . S/P kidney transplant    September, 2012, [facility]  . Subacromial impingement of 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    Past Surgical History:  Procedure Laterality Date  . ABDOMINAL HYSTERECTOMY  1980'S  . ARTERIOVENOUS GRAFT PLACEMENT Left 03/04/2007   forearm  . BREAST EXCISIONAL BIOPSY    . BREAST SURGERY  YRS AGO   RT BREAST CYST REMOVED   . CARDIAC CATHETERIZATION  06/03/2002; 12/04/2007  . COLONOSCOPY WITH PROPOFOL N/A  07/14/2014   Procedure: COLONOSCOPY WITH PROPOFOL;  Surgeon: Lear Ng, MD;  Location: [facility];  Service: Endoscopy;  Laterality: N/A;  . CORONARY ARTERY BYPASS GRAFT  06/06/2002   x 4  . HOT HEMOSTASIS N/A 07/14/2014   Procedure: HOT HEMOSTASIS (ARGON PLASMA COAGULATION/BICAP);  Surgeon: Lear Ng, MD;  Location: [facility];  Service: Endoscopy;  Laterality: N/A;  . KIDNEY TRANSPLANT Right 08/04/2011  . ORIF PATELLA Left 04/06/2016   Procedure: OPEN REDUCTION INTERNAL (ORIF) LEFT  PATELLA;  Surgeon: Ninetta Lights, MD;  Location: [place];  Service: Orthopedics;  Laterality: Left;  . SHOULDER ARTHROSCOPY WITH ROTATOR CUFF REPAIR AND SUBACROMIAL DECOMPRESSION Left 08/03/2016   Procedure: LEFT SHOULDER ARTHROSCOPY WITH ROTATOR CUFF REPAIR AND SUBACROMIAL DECOMPRESSION with distal claviculectomy and extentsive debridement;  Surgeon: Ninetta Lights, MD;  Location: [place];  Service: Orthopedics;  Laterality: Left;  Block  . THROMBECTOMY AND REVISION OF ARTERIOVENTOUS (AV) GORETEX  GRAFT Left 05/08/2007   forearm  . TOTAL HIP ARTHROPLASTY  12/18/2012   Procedure: TOTAL HIP ARTHROPLASTY;  Surgeon: Ninetta Lights, MD;  Location: [place];  Service: Orthopedics;  Laterality: Left;  Marland Kitchen VESICOVAGINAL FISTULA CLOSURE W/ TAH  1984    Social History   Socioeconomic History  . Marital status: Divorced    Spouse name: Not on file  . Number of children: Not on file  . Years of education:  Not on file  . Highest education level: Not on file  Occupational History  . Not on file  Social Needs  . Financial resource strain: Not on file  . Food insecurity:    Worry: Not on file    Inability: Not on file  . Transportation needs:    Medical: Not on file    Non-medical: Not on file  Tobacco Use  . Smoking status: Former Smoker    Packs/day: 1.00    Years: 8.00    Pack years: 8.00    Types: Cigarettes    Last attempt to quit: 11/21/1983    Years  since quitting: 34.5  . Smokeless tobacco: Never Used  Substance and Sexual Activity  . Alcohol use: No  . Drug use: No  . Sexual activity: Not on file  Lifestyle  . Physical activity:    Days per week: Not on file    Minutes per session: Not on file  . Stress: Not on file  Relationships  . Social connections:    Talks on phone: Not on file    Gets together: Not on file    Attends religious service: Not on file    Active member of club or organization: Not on file    Attends meetings of clubs or organizations: Not on file    Relationship status: Not on file  . Intimate partner violence:    Fear of current or ex partner: Not on file    Emotionally abused: Not on file    Physically abused: Not on file    Forced sexual activity: Not on file  Other Topics Concern  . Not on file  Social History Narrative  . Not on file    Current Outpatient Medications on File Prior to Visit  Medication Sig Dispense Refill  . amLODipine (NORVASC) 10 MG tablet Take 10 mg by mouth every morning.     Marland Kitchen aspirin EC 81 MG tablet Take 81 mg by mouth daily.    Marland Kitchen azaTHIOprine (IMURAN) 50 MG tablet Take 50 mg by mouth daily.    . Azelastine HCl 0.15 % SOLN Place 1 spray into the nose 2 (two) times daily.    . calcitRIOL (ROCALTROL) 0.25 MCG capsule Take 0.25 mcg by mouth daily. Alternate 2 tablets one day, 3 tablets the next day.    . fluticasone (FLONASE) 50 MCG/ACT nasal spray Place 1 spray into both nostrils 2 (two) times daily.    . fluticasone furoate-vilanterol (BREO ELLIPTA) 200-25 MCG/INH AEPB Inhale 1 puff into the lungs daily.    . nitroGLYCERIN (NITROSTAT) 0.4 MG SL tablet Place 1 tablet (0.4 mg total) under the tongue every 5 (five) minutes as needed for chest pain. 25 tablet 6  . omeprazole (PRILOSEC) 20 MG capsule Take 20 mg by mouth daily.     . predniSONE (DELTASONE) 5 MG tablet Take 1 tablet (5 mg total) by mouth daily.    . rosuvastatin (CRESTOR) 10 MG tablet Take 1 tablet (10 mg total)  by mouth daily. 90 tablet 3  . tacrolimus (PROGRAF) 1 MG capsule Take 5 mg by mouth every 12 (twelve) hours. Pt takes medication at 9 am & 9 pm    . Tiotropium Bromide Monohydrate (SPIRIVA RESPIMAT) 1.25 MCG/ACT AERS Inhale 1 puff into the lungs daily.    Marland Kitchen triamterene-hydrochlorothiazide (MAXZIDE-25) 37.5-25 MG tablet Take 1 tablet by mouth daily. 30 tablet 0   No current facility-administered medications on file prior to visit.     Allergies  Allergen Reactions  . Lactose Other (See Comments)  . [place] Arthritis Strength-Antacid [Aspirin Buffered] Other (See Comments)    STOMACH BURNS, N/V   . Aspirin Nausea And Vomiting    Per pt. "can tolerate the enteric coated tablets".   . Atorvastatin Other (See Comments)    Patient's skin was skin was sensitive  . Banana Nausea And Vomiting  . Lactose Intolerance (Gi) Nausea And Vomiting  . Lisinopril Cough    Family History  Problem Relation Age of Onset  . Breast cancer Sister   . Cancer Neg Hx     BP 134/64 (BP Location: Right Arm, Patient Position: Sitting, Cuff Size: Normal)   Pulse 99   Ht 5\' 2"  (1.575 m)   Wt 154 lb 12.8 oz (70.2 kg)   SpO2 92% Comment: 2L's O2  BMI 28.31 kg/m    Review of Systems She denies hypoglycemia.      Objective:   Physical Exam VITAL SIGNS:  See vs page GENERAL: no distress Pulses: dorsalis pedis intact bilat.   MSK: no deformity of the feet CV: no leg edema Skin:  no ulcer on the feet.  normal color and temp on the feet. Neuro: sensation is intact to touch on the feet Ext: There is bilateral onychomycosis of the toenails.    Lab Results  Component Value Date   HGBA1C 5.8 (A) 06/12/2018   Lab Results  Component Value Date   CREATININE 1.05 (H) 06/05/2018   BUN 14 06/05/2018   NA 143 06/05/2018   K 3.5 06/05/2018   CL 102 06/05/2018   CO2 25 06/05/2018       Assessment & Plan:  Type 2 DM, with CAD: overcontrolled Renal insuff: if she uses SU in this setting, dosage should be  minimized.   Patient Instructions  check your blood sugar once a day.  vary the time of day when you check, between before the 3 meals, and at bedtime.  also check if you have symptoms of your blood sugar being too high or too low.  please keep a record of the readings and bring it to your next appointment here (or you can bring the meter itself).  You can write it on any piece of paper.  please call us sooner if your blood sugar goes below 70, or if you have a lot of readings over 200.   Please reduce the glipizide to 1/2 pill per day.   Please come back for a follow-up appointment in 3 months.

## 2018-07-01 ENCOUNTER — Encounter (HOSPITAL_COMMUNITY): Payer: Self-pay

## 2018-07-01 ENCOUNTER — Encounter (HOSPITAL_COMMUNITY)
Admission: RE | Admit: 2018-07-01 | Discharge: 2018-07-01 | Disposition: A | Discharge status: Home / Self Care | Payer: Medicare Other | Source: Ambulatory Visit | Attending: Pulmonary Disease | Admitting: Pulmonary Disease

## 2018-07-01 VITALS — BP 158/70 | HR 87 | Temp 97.8°F | Resp 18 | Ht 63.0 in | Wt 156.3 lb

## 2018-07-01 DIAGNOSIS — J432 Centrilobular emphysema: Secondary | ICD-10-CM | POA: Diagnosis not present

## 2018-07-01 NOTE — Progress Notes (Signed)
[name] 77 y.o. female Pulmonary Rehab Orientation Note [name], referred to pulmonary rehab by her pulmonologist Dr. Ernst Breach at [facility],   arrived today in Cardiac and Pulmonary Rehab for orientation to Pulmonary Rehab. She was transported from [employer] via wheel chair. She does carry portable oxygen. Per pt, she uses oxygen continuously. Color good, skin warm and dry. Patient is oriented to time and place. Patient's medical history, psychosocial health, and medications reviewed. Psychosocial assessment reveals pt lives alone. Pt is currently retired. Pt hobbies include cooking. Pt reports her stress level is n/a. Although pt does not have support of her immediate family who live here in [place], pt is at peace with this and is not bothered by their lack of concern.  Pt has a grandson who helps her when she needs help with errands or chores around the home. PHQ2/9 score 0/2. Pt shows good  coping skills with positive outlook . Will continue to monitor and evaluate progress toward psychosocial goal(s) of continued mental well being. Physical assessment reveals heart rate is normal, breath sounds clear to auscultation, no wheezes, rales, or rhonchi. Grip strength equal, strong. Distal pulses palpable with no swelling. Patient reports she does take medications as prescribed. Patient states she follows a Regular  Lactose free diet. The patient reports no specific efforts to gain or lose weight. Pt is pleased with her present weight.   Patient's weight will be monitored closely. Demonstration and practice of PLB using pulse oximeter. Patient able to return demonstration satisfactorily. Safety and hand hygiene in the exercise area reviewed with patient. Patient voices understanding of the information reviewed. Department expectations discussed with patient and achievable goals were set. The patient shows enthusiasm about attending the program and we look forward to working with this nice lady. The  patient is scheduled for a 6 min walk test on 8/13 and to begin exercise on 8/20 @ 10:30. 45 minutes was spent on a variety of activities such as assessment of the patient, obtaining baseline data including height, weight, BMI, and grip strength, verifying medical history, allergies, and current medications, and teaching patient strategies for performing tasks with less respiratory effort with emphasis on pursed lip breathing. [name], BSN Cardiac and Training and development officer

## 2018-07-02 ENCOUNTER — Encounter (HOSPITAL_COMMUNITY): Payer: Self-pay | Admitting: *Deleted

## 2018-07-02 ENCOUNTER — Encounter (HOSPITAL_COMMUNITY)
Admission: RE | Admit: 2018-07-02 | Discharge: 2018-07-02 | Disposition: A | Discharge status: Home / Self Care | Payer: Medicare Other | Source: Ambulatory Visit | Attending: Pulmonary Disease | Admitting: Pulmonary Disease

## 2018-07-02 DIAGNOSIS — J432 Centrilobular emphysema: Secondary | ICD-10-CM

## 2018-07-02 NOTE — Progress Notes (Signed)
[name] 77 y.o. female  DOB: [birth year] MRN: [id number]           Nutrition Note 1. Centrilobular emphysema ([place])    Past Medical History:  Diagnosis Date  . 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 Asthma   . Coronary artery disease    cath 11/2007, grafts patent /  Nuclear, June, 2011, prior inferior MI with mild peri-infarct ischemia, anterior breast attenuation, EF 67%, done for renal transplant assessment / Low level exercise/Lexiscan Myoview (11/2013): EF 67%, no ischemi; normal study  . Diabetes mellitus    type 2  . ESRD on dialysis [facility])    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, 2012  . GERD (gastroesophageal reflux disease)   . GI bleed 11/26/2010   January, 2012 , AVM  . Gout   . History of methicillin resistant staphylococcus aureus (MRSA)   . Hyperlipidemia   . Hyperparathyroidism   . Hypertension   . Myocardial infarction ([place])   . Osteoporosis   . Pneumonia 08/2010    Hospitalization, October, 2011  . Primary osteoarthritis, 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  . S/P kidney transplant    September, 2012, [facility]  . Subacromial impingement of 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   Meds reviewed. Rosuvastatin, glipizide, maxzide noted  Ht: Ht Readings from Last 1 Encounters:  07/01/18 5\' 3"  (1.6 m)     Wt:  Wt Readings from Last 3 Encounters:  07/01/18 156 lb 4.9 oz (70.9 kg)  06/12/18 154 lb 12.8 oz (70.2 kg)  04/04/18 152 lb (68.9 kg)     BMI: Body mass index is 27.69 kg/m.    Current tobacco use? no  Labs:  Lipid Panel     Component Value Date/Time   CHOL 171 06/05/2018 0826   TRIG 125 06/05/2018 0826   HDL 71 06/05/2018 0826   CHOLHDL 2.4 06/05/2018 0826   CHOLHDL 2.6 02/14/2018 0223   VLDL 20 02/14/2018 0223   LDLCALC 75 06/05/2018 0826   LDLDIRECT 136.3 02/26/2012 1715    Lab Results  Component Value Date   HGBA1C 5.8 (A) 06/12/2018    Nutrition Diagnosis  ? Overweight related to  excessive energy intake as evidenced by a BMI of Body mass index is 27.69 kg/m.    Goal(s) 1. Pt to identify and limit food sources of sodium. 2. The pt will recognize symptoms that can interfere with adequate oral intake, such as shortness of breath, N/V, early satiety, fatigue, ability to secure and prepare food, taste and smell changes, chewing/swallowing difficulties, and/ or pain when eating. 3. The pt will consume high-energy, high-nutrient dense beverages when necessary to compensate for decreased oral intake of solid foods. 4. The pt will have family and friends shop for food when necessary so that nourishing foods are always available at home. 5. Identify food quantities necessary to achieve wt loss of  -2# per week to a goal wt loss of 2.7-10.9 kg (6-15 lb) at graduation from pulmonary rehab. 6. Describe the benefit of including fruits, vegetables, whole grains, and low-fat dairy products in a healthy meal plan.  Plan:  Pt to attend Pulmonary Nutrition class Will provide client-centered nutrition education as part of interdisciplinary care.    Monitor and Evaluate progress toward nutrition goal with team.   Laurina Bustle, MS, RD, LDN 07/02/2018 1:43 PM

## 2018-07-04 NOTE — Progress Notes (Signed)
Pulmonary Individual Treatment Plan  Patient Details  Name: [name] MRN: [id number] Date of Birth: 02-16-1941 Referring Provider:     Pulmonary Rehab Walk Test from 07/02/2018 in [name]  Referring Provider  Dr. Nelda Marseille      Initial Encounter Date:    Pulmonary Rehab Walk Test from 07/02/2018 in [name]  Date  07/02/18      Visit Diagnosis: Centrilobular emphysema ([place])  Patient's Home Medications on Admission:   Current Outpatient Medications:  .  amLODipine (NORVASC) 10 MG tablet, Take 10 mg by mouth every morning. , Disp: , Rfl:  .  aspirin EC 81 MG tablet, Take 81 mg by mouth daily., Disp: , Rfl:  .  azaTHIOprine (IMURAN) 50 MG tablet, Take 50 mg by mouth daily., Disp: , Rfl:  .  Azelastine HCl 0.15 % SOLN, Place 1 spray into the nose 2 (two) times daily., Disp: , Rfl:  .  calcitRIOL (ROCALTROL) 0.25 MCG capsule, Take 0.25 mcg by mouth daily. , Disp: , Rfl:  .  ferrous sulfate 325 (65 FE) MG tablet, Take 325 mg by mouth daily with breakfast., Disp: , Rfl:  .  fluticasone (FLONASE) 50 MCG/ACT nasal spray, Place 1 spray into both nostrils 2 (two) times daily., Disp: , Rfl:  .  fluticasone furoate-vilanterol (BREO ELLIPTA) 200-25 MCG/INH AEPB, Inhale 1 puff into the lungs daily., Disp: , Rfl:  .  glipiZIDE (GLUCOTROL) 5 MG tablet, Take 0.5 tablets (2.5 mg total) by mouth daily before breakfast., Disp: 30 tablet, Rfl: 1 .  nitroGLYCERIN (NITROSTAT) 0.4 MG SL tablet, Place 1 tablet (0.4 mg total) under the tongue every 5 (five) minutes as needed for chest pain., Disp: 25 tablet, Rfl: 6 .  omeprazole (PRILOSEC) 20 MG capsule, Take 20 mg by mouth daily. , Disp: , Rfl:  .  predniSONE (DELTASONE) 5 MG tablet, Take 1 tablet (5 mg total) by mouth daily., Disp: , Rfl:  .  rosuvastatin (CRESTOR) 10 MG tablet, Take 1 tablet (10 mg total) by mouth daily., Disp: 90 tablet, Rfl: 3 .  sennosides-docusate sodium (SENOKOT-S)  8.6-50 MG tablet, Take 1 tablet by mouth daily., Disp: , Rfl:  .  tacrolimus (PROGRAF) 1 MG capsule, Take 1 mg by mouth 2 (two) times daily. Pt takes 5 capsules (62m) in the morning at 9 am and 6 capsules(641m at 9 pm, Disp: , Rfl:  .  Tiotropium Bromide Monohydrate (SPIRIVA RESPIMAT) 1.25 MCG/ACT AERS, Inhale 1 puff into the lungs daily., Disp: , Rfl:  .  triamterene-hydrochlorothiazide (MAXZIDE-25) 37.5-25 MG tablet, Take 1 tablet by mouth daily. (Patient not taking: Reported on 07/01/2018), Disp: 30 tablet, Rfl: 0  Past Medical History: Past Medical History:  Diagnosis Date  . 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 Asthma   . Coronary artery disease    cath 11/2007, grafts patent /  Nuclear, June, 2011, prior inferior MI with mild peri-infarct ischemia, anterior breast attenuation, EF 67%, done for renal transplant assessment / Low level exercise/Lexiscan Myoview (11/2013): EF 67%, no ischemi; normal study  . Diabetes mellitus    type 2  [facility]   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, 2012  . GERD (gastroesophageal reflux disease)   . GI bleed 11/26/2010   January, 2012 , AVM  . Gout   . History of methicillin resistant staphylococcus  aureus (MRSA)   . Hyperlipidemia   . Hyperparathyroidism   . Hypertension   . Myocardial infarction ([place])   . Osteoporosis   . Pneumonia 08/2010    Hospitalization, October, 2011  . Primary osteoarthritis, 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  . S/P kidney transplant    September, 2012, [facility]  . Subacromial impingement of 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    Tobacco Use: Social History   Tobacco Use  Smoking Status Former Smoker  . Packs/day: 1.00  . Years: 8.00  . Pack years: 8.00  . Types: Cigarettes  . Last attempt to quit: 11/21/1983  . Years since quitting: 34.6  Smokeless Tobacco Never Used    Labs: Recent Review Flowsheet Data    Labs for ITP Cardiac and Pulmonary  Rehab Latest Ref Rng & Units 09/18/2016 02/13/2018 02/14/2018 06/05/2018 06/12/2018   Cholestrol 100 - 199 mg/dL 165 - 182 171 -   LDLCALC 0 - 99 mg/dL 80 - 93 75 -   LDLDIRECT mg/dL - - - - -   HDL >39 mg/dL 63 - 69 71 -   Trlycerides 0 - 149 mg/dL 111 - 102 125 -   Hemoglobin A1c 4.0 - 5.6 % - 5.7(H) - - 5.8(A)   PHART 7.350 - 7.400 - - - - -   PCO2ART 35.0 - 45.0 mmHg - - - - -   HCO3 20.0 - 24.0 mEq/L - - - - -   TCO2 0 - 100 mmol/L - - - - -   ACIDBASEDEF 0.0 - 2.0 mmol/L - - - - -   O2SAT % - - - - -      Capillary Blood Glucose: Lab Results  Component Value Date   GLUCAP 153 (H) 02/21/2018   GLUCAP 170 (H) 02/21/2018   GLUCAP 123 (H) 02/21/2018   GLUCAP 119 (H) 02/21/2018   GLUCAP 138 (H) 02/20/2018     Pulmonary Assessment Scores: Pulmonary Assessment Scores    Row Name 07/02/18 1655 07/04/18 0730       ADL UCSD   ADL Phase  Entry  Entry    SOB Score total  76  -      CAT Score   CAT Score  30  -      mMRC Score   mMRC Score  -  3       Pulmonary Function Assessment:   Exercise Target Goals: Exercise Program Goal: Individual exercise prescription set using results from initial 6 min walk test and THRR while considering  patient's activity barriers and safety.   Exercise Prescription Goal: Initial exercise prescription builds to 30-45 minutes a day of aerobic activity, 2-3 days per week.  Home exercise guidelines will be given to patient during program as part of exercise prescription that the participant will acknowledge.  Activity Barriers & Risk Stratification: Activity Barriers & Cardiac Risk Stratification - 07/01/18 1041      Activity Barriers & Cardiac Risk Stratification   Activity Barriers  Left Knee Replacement;Joint Problems;Left Hip Replacement;Shortness of Breath;Arthritis;Back Problems   shoulder surgery bilateral      6 Minute Walk: 6 Minute Walk    Row Name 07/04/18 0728         6 Minute Walk   Phase  Initial     Distance  1113  feet     Walk Time  6 minutes     # of Rest Breaks  0     MPH  2.1     METS  2.61  RPE  12     Perceived Dyspnea   1     Symptoms  Yes (comment)     Comments  used wheelchair/leg fatigue     Resting HR  88 bpm     Resting BP  144/80     Resting Oxygen Saturation   91 %     Exercise Oxygen Saturation  during 6 min walk  89 %     Max Ex. HR  112 bpm     Max Ex. BP  194/80     2 Minute Post BP  148/68       Interval HR   1 Minute HR  98     2 Minute HR  104     3 Minute HR  108     4 Minute HR  108     5 Minute HR  110     6 Minute HR  112     2 Minute Post HR  98     Interval Heart Rate?  Yes       Interval Oxygen   Interval Oxygen?  Yes     Baseline Oxygen Saturation %  91 %     1 Minute Oxygen Saturation %  93 %     1 Minute Liters of Oxygen  2 L     2 Minute Oxygen Saturation %  90 %     2 Minute Liters of Oxygen  2 L     3 Minute Oxygen Saturation %  89 %     3 Minute Liters of Oxygen  2 L     4 Minute Oxygen Saturation %  90 %     4 Minute Liters of Oxygen  2 L     5 Minute Oxygen Saturation %  91 %     5 Minute Liters of Oxygen  2 L     6 Minute Oxygen Saturation %  91 %     6 Minute Liters of Oxygen  2 L     2 Minute Post Oxygen Saturation %  95 %     2 Minute Post Liters of Oxygen  2 L        Oxygen Initial Assessment: Oxygen Initial Assessment - 07/04/18 0730      Initial 6 min Walk   Oxygen Used  Continuous    Liters per minute  2      Program Oxygen Prescription   Program Oxygen Prescription  Continuous;E-Tanks    Liters per minute  2       Oxygen Re-Evaluation:   Oxygen Discharge (Final Oxygen Re-Evaluation):   Initial Exercise Prescription: Initial Exercise Prescription - 07/04/18 0700      Date of Initial Exercise RX and Referring Provider   Date  07/02/18    Referring Provider  Dr. Nelda Marseille      Oxygen   Oxygen  Continuous    Liters  2      Recumbant Bike   Level  2    Watts  10    Minutes  17      NuStep   Level  2     SPM  80    Minutes  17    METs  1.5      Track   Laps  7    Minutes  17      Prescription Details   Frequency (times per week)  2    Duration  Progress to 45  minutes of aerobic exercise without signs/symptoms of physical distress      Intensity   THRR 40-80% of Max Heartrate  58-116    Ratings of Perceived Exertion  11-13    Perceived Dyspnea  0-4      Progression   Progression  Continue progressive overload as per policy without signs/symptoms or physical distress.      Resistance Training   Training Prescription  Yes    Weight  BLUE BANDS    Reps  10-15       Perform Capillary Blood Glucose checks as needed.  Exercise Prescription Changes:   Exercise Comments:   Exercise Goals and Review: Exercise Goals    Row Name 07/01/18 1042             Exercise Goals   Increase Physical Activity  Yes       Intervention  Provide advice, education, support and counseling about physical activity/exercise needs.;Develop an individualized exercise prescription for aerobic and resistive training based on initial evaluation findings, risk stratification, comorbidities and participant's personal goals.       Expected Outcomes  Short Term: Attend rehab on a regular basis to increase amount of physical activity.;Long Term: Add in home exercise to make exercise part of routine and to increase amount of physical activity.;Long Term: Exercising regularly at least 3-5 days a week.       Increase Strength and Stamina  Yes       Intervention  Provide advice, education, support and counseling about physical activity/exercise needs.;Develop an individualized exercise prescription for aerobic and resistive training based on initial evaluation findings, risk stratification, comorbidities and participant's personal goals.       Expected Outcomes  Short Term: Increase workloads from initial exercise prescription for resistance, speed, and METs.;Short Term: Perform resistance training exercises  routinely during rehab and add in resistance training at home;Long Term: Improve cardiorespiratory fitness, muscular endurance and strength as measured by increased METs and functional capacity (6MWT)       Able to understand and use rate of perceived exertion (RPE) scale  Yes       Intervention  Provide education and explanation on how to use RPE scale       Expected Outcomes  Short Term: Able to use RPE daily in rehab to express subjective intensity level;Long Term:  Able to use RPE to guide intensity level when exercising independently       Able to understand and use Dyspnea scale  Yes       Intervention  Provide education and explanation on how to use Dyspnea scale       Expected Outcomes  Short Term: Able to use Dyspnea scale daily in rehab to express subjective sense of shortness of breath during exertion;Long Term: Able to use Dyspnea scale to guide intensity level when exercising independently       Knowledge and understanding of Target Heart Rate Range (THRR)  Yes       Intervention  Provide education and explanation of THRR including how the numbers were predicted and where they are located for reference       Expected Outcomes  Short Term: Able to state/look up THRR;Short Term: Able to use daily as guideline for intensity in rehab;Long Term: Able to use THRR to govern intensity when exercising independently       Understanding of Exercise Prescription  Yes       Intervention  Provide education, explanation, and written materials on patient's individual exercise prescription  Expected Outcomes  Short Term: Able to explain program exercise prescription;Long Term: Able to explain home exercise prescription to exercise independently          Exercise Goals Re-Evaluation :   Discharge Exercise Prescription (Final Exercise Prescription Changes):   Nutrition:  Target Goals: Understanding of nutrition guidelines, daily intake of sodium 1500mg , cholesterol 200mg , calories 30% from  fat and 7% or less from saturated fats, daily to have 5 or more servings of fruits and vegetables.  Biometrics:    Nutrition Therapy Plan and Nutrition Goals: Nutrition Therapy & Goals - 07/02/18 1350      Nutrition Therapy   Diet  consistent carbohydrate heart healthy      Personal Nutrition Goals   Nutrition Goal  The pt will recognize symptoms that can interfere with adequate oral intake    Personal Goal #2  pt will consume high-energy, high-nutrient dense beverages when necessary to compensate for decreased oral intake      Intervention Plan   Intervention  Prescribe, educate and counsel regarding individualized specific dietary modifications aiming towards targeted core components such as weight, hypertension, lipid management, diabetes, heart failure and other comorbidities.    Expected Outcomes  Short Term Goal: Understand basic principles of dietary content, such as calories, fat, sodium, cholesterol and nutrients.       Nutrition Assessments: Nutrition Assessments - 07/02/18 1351      Rate Your Plate Scores   Pre Score  46       Nutrition Goals Re-Evaluation:   Nutrition Goals Discharge (Final Nutrition Goals Re-Evaluation):   Psychosocial: Target Goals: Acknowledge presence or absence of significant depression and/or stress, maximize coping skills, provide positive support system. Participant is able to verbalize types and ability to use techniques and skills needed for reducing stress and depression.  Initial Review & Psychosocial Screening: Initial Psych Review & Screening - 07/01/18 1045      Initial Review   Current issues with  None Identified      Family Dynamics   Good Support System?  No    Strains  Intra-family strains    Comments  has four children who live in [place] that do not help or concerned about her health.  Yolanda Bonine helps with errands.       Barriers   Psychosocial barriers to participate in program  The patient should benefit from  training in stress management and relaxation.      Screening Interventions   Interventions  Encouraged to exercise    Expected Outcomes  Short Term goal: Identification and review with participant of any Quality of Life or Depression concerns found by scoring the questionnaire.;Long Term goal: The participant improves quality of Life and PHQ9 Scores as seen by post scores and/or verbalization of changes       Quality of Life Scores:  Scores of 19 and below usually indicate a poorer quality of life in these areas.  A difference of  2-3 points is a clinically meaningful difference.  A difference of 2-3 points in the total score of the Quality of Life Index has been associated with significant improvement in overall quality of life, self-image, physical symptoms, and general health in studies assessing change in quality of life.  PHQ-9: Recent Review Flowsheet Data    Depression screen [facility] 2/9 07/01/2018   Decreased Interest 0   Down, Depressed, Hopeless 0   PHQ - 2 Score 0   Altered sleeping 0   Tired, decreased energy 2   Change in  appetite 0   Feeling bad or failure about yourself  0   Trouble concentrating 0   Moving slowly or fidgety/restless 0   PHQ-9 Score 2   Difficult doing work/chores Not difficult at all     Interpretation of Total Score  Total Score Depression Severity:  1-4 = Minimal depression, 5-9 = Mild depression, 10-14 = Moderate depression, 15-19 = Moderately severe depression, 20-27 = Severe depression   Psychosocial Evaluation and Intervention: Psychosocial Evaluation - 07/01/18 1049      Psychosocial Evaluation & Interventions   Interventions  Stress management education;Relaxation education;Encouraged to exercise with the program and follow exercise prescription    Comments  althoug pt has intra family strains, pt has resolved this wihin herself.    Expected Outcomes  Pt will continue to display positive and healthy coping skills and have peace with the  relationship she has with her children    Continue Psychosocial Services   Follow up required by staff       Psychosocial Re-Evaluation: Psychosocial Re-Evaluation    [place] Name 07/01/18 1052             Psychosocial Re-Evaluation   Current issues with  None Identified       Comments  no identifiable barriers       Interventions  Relaxation education;Encouraged to attend Pulmonary Rehabilitation for the exercise;Stress management education          Psychosocial Discharge (Final Psychosocial Re-Evaluation): Psychosocial Re-Evaluation - 07/01/18 1052      Psychosocial Re-Evaluation   Current issues with  None Identified    Comments  no identifiable barriers    Interventions  Relaxation education;Encouraged to attend Pulmonary Rehabilitation for the exercise;Stress management education       Education: Education Goals: Education classes will be provided on a weekly basis, covering required topics. Participant will state understanding/return demonstration of topics presented.  Learning Barriers/Preferences: Learning Barriers/Preferences - 07/01/18 1053      Learning Barriers/Preferences   Learning Barriers  Sight    Learning Preferences  Group Instruction;Individual Instruction;Verbal Instruction;Written Material       Education Topics: Risk Factor Reduction:  -Group instruction that is supported by a PowerPoint presentation. Instructor discusses the definition of a risk factor, different risk factors for pulmonary disease, and how the heart and lungs work together.     Nutrition for Pulmonary Patient:  -Group instruction provided by PowerPoint slides, verbal discussion, and written materials to support subject matter. The instructor gives an explanation and review of healthy diet recommendations, which includes a discussion on weight management, recommendations for fruit and vegetable consumption, as well as protein, fluid, caffeine, fiber, sodium, sugar, and alcohol. Tips  for eating when patients are short of breath are discussed.   Pursed Lip Breathing:  -Group instruction that is supported by demonstration and informational handouts. Instructor discusses the benefits of pursed lip and diaphragmatic breathing and detailed demonstration on how to preform both.     Oxygen Safety:  -Group instruction provided by PowerPoint, verbal discussion, and written material to support subject matter. There is an overview of "What is Oxygen" and "Why do we need it".  Instructor also reviews how to create a safe environment for oxygen use, the importance of using oxygen as prescribed, and the risks of noncompliance. There is a brief discussion on traveling with oxygen and resources the patient may utilize.   Oxygen Equipment:  -Group instruction provided by [facility] Staff utilizing handouts, written materials, and equipment demonstrations.  Signs and Symptoms:  -Group instruction provided by written material and verbal discussion to support subject matter. Warning signs and symptoms of infection, stroke, and heart attack are reviewed and when to call the physician/911 reinforced. Tips for preventing the spread of infection discussed.   Advanced Directives:  -Group instruction provided by verbal instruction and written material to support subject matter. Instructor reviews Advanced Directive laws and proper instruction for filling out document.   Pulmonary Video:  -Group video education that reviews the importance of medication and oxygen compliance, exercise, good nutrition, pulmonary hygiene, and pursed lip and diaphragmatic breathing for the pulmonary patient.   Exercise for the Pulmonary Patient:  -Group instruction that is supported by a PowerPoint presentation. Instructor discusses benefits of exercise, core components of exercise, frequency, duration, and intensity of an exercise routine, importance of utilizing pulse oximetry during exercise, safety while  exercising, and options of places to exercise outside of rehab.     Pulmonary Medications:  -Verbally interactive group education provided by instructor with focus on inhaled medications and proper administration.   Anatomy and Physiology of the Respiratory System and Intimacy:  -Group instruction provided by PowerPoint, verbal discussion, and written material to support subject matter. Instructor reviews respiratory cycle and anatomical components of the respiratory system and their functions. Instructor also reviews differences in obstructive and restrictive respiratory diseases with examples of each. Intimacy, Sex, and Sexuality differences are reviewed with a discussion on how relationships can change when diagnosed with pulmonary disease. Common sexual concerns are reviewed.   MD DAY -A group question and answer session with a medical doctor that allows participants to ask questions that relate to their pulmonary disease state.   OTHER EDUCATION -Group or individual verbal, written, or video instructions that support the educational goals of the pulmonary rehab program.   Holiday Eating Survival Tips:  -Group instruction provided by PowerPoint slides, verbal discussion, and written materials to support subject matter. The instructor gives patients tips, tricks, and techniques to help them not only survive but enjoy the holidays despite the onslaught of food that accompanies the holidays.   Knowledge Questionnaire Score: Knowledge Questionnaire Score - 07/02/18 1656      Knowledge Questionnaire Score   Pre Score  17/18       Core Components/Risk Factors/Patient Goals at Admission: Personal Goals and Risk Factors at Admission - 07/01/18 1053      Core Components/Risk Factors/Patient Goals on Admission    Weight Management  Yes;Weight Maintenance    Intervention  Weight Management: Develop a combined nutrition and exercise program designed to reach desired caloric intake, while  maintaining appropriate intake of nutrient and fiber, sodium and fats, and appropriate energy expenditure required for the weight goal.;Weight Management: Provide education and appropriate resources to help participant work on and attain dietary goals.;Weight Management/Obesity: Establish reasonable short term and long term weight goals.    Admit Weight  156 lb 4.9 oz (70.9 kg)    Expected Outcomes  Understanding of distribution of calorie intake throughout the day with the consumption of 4-5 meals/snacks;Understanding recommendations for meals to include 15-35% energy as protein, 25-35% energy from fat, 35-60% energy from carbohydrates, less than 200mg  of dietary cholesterol, 20-35 gm of total fiber daily;Weight Maintenance: Understanding of the daily nutrition guidelines, which includes 25-35% calories from fat, 7% or less cal from saturated fats, less than 200mg  cholesterol, less than 1.5gm of sodium, & 5 or more servings of fruits and vegetables daily;Short Term: Continue to assess and modify interventions until  short term weight is achieved;Long Term: Adherence to nutrition and physical activity/exercise program aimed toward attainment of established weight goal    Improve shortness of breath with ADL's  Yes    Intervention  Provide education, individualized exercise plan and daily activity instruction to help decrease symptoms of SOB with activities of daily living.    Expected Outcomes  Short Term: Improve cardiorespiratory fitness to achieve a reduction of symptoms when performing ADLs;Long Term: Be able to perform more ADLs without symptoms or delay the onset of symptoms    Diabetes  Yes    Intervention  Provide education about signs/symptoms and action to take for hypo/hyperglycemia.;Provide education about proper nutrition, including hydration, and aerobic/resistive exercise prescription along with prescribed medications to achieve blood glucose in normal ranges: Fasting glucose 65-99 mg/dL     Expected Outcomes  Short Term: Participant verbalizes understanding of the signs/symptoms and immediate care of hyper/hypoglycemia, proper foot care and importance of medication, aerobic/resistive exercise and nutrition plan for blood glucose control.;Long Term: Attainment of HbA1C < 7%.    Hypertension  Yes    Intervention  Provide education on lifestyle modifcations including regular physical activity/exercise, weight management, moderate sodium restriction and increased consumption of fresh fruit, vegetables, and low fat dairy, alcohol moderation, and smoking cessation.;Monitor prescription use compliance.    Expected Outcomes  Short Term: Continued assessment and intervention until BP is < 140/74mm HG in hypertensive participants. < 130/12mm HG in hypertensive participants with diabetes, heart failure or chronic kidney disease.;Long Term: Maintenance of blood pressure at goal levels.       Core Components/Risk Factors/Patient Goals Review:    Core Components/Risk Factors/Patient Goals at Discharge (Final Review):    ITP Comments: ITP Comments    Row Name 07/01/18 1025           ITP Comments  Dr. Jennet Maduro, medicare Director          Comments:

## 2018-07-09 ENCOUNTER — Encounter (HOSPITAL_COMMUNITY)
Admission: RE | Admit: 2018-07-09 | Discharge: 2018-07-09 | Disposition: A | Discharge status: Home / Self Care | Payer: Medicare Other | Source: Ambulatory Visit | Attending: Pulmonary Disease | Admitting: Pulmonary Disease

## 2018-07-09 VITALS — Wt 154.8 lb

## 2018-07-09 DIAGNOSIS — J432 Centrilobular emphysema: Secondary | ICD-10-CM

## 2018-07-09 NOTE — Progress Notes (Signed)
Pulmonary Individual Treatment Plan  Patient Details  Name: [name] MRN: [id number] Date of Birth: 02-16-1941 Referring Provider:     Pulmonary Rehab Walk Test from 07/02/2018 in [name]  Referring Provider  Dr. Nelda Ward      Initial Encounter Date:    Pulmonary Rehab Walk Test from 07/02/2018 in [name]  Date  07/02/18      Visit Diagnosis: Centrilobular emphysema ([place])  Patient's Home Medications on Admission:   Current Outpatient Medications:  .  amLODipine (NORVASC) 10 MG tablet, Take 10 mg by mouth every morning. , Disp: , Rfl:  .  aspirin EC 81 MG tablet, Take 81 mg by mouth daily., Disp: , Rfl:  .  azaTHIOprine (IMURAN) 50 MG tablet, Take 50 mg by mouth daily., Disp: , Rfl:  .  Azelastine HCl 0.15 % SOLN, Place 1 spray into the nose 2 (two) times daily., Disp: , Rfl:  .  calcitRIOL (ROCALTROL) 0.25 MCG capsule, Take 0.25 mcg by mouth daily. , Disp: , Rfl:  .  ferrous sulfate 325 (65 FE) MG tablet, Take 325 mg by mouth daily with breakfast., Disp: , Rfl:  .  fluticasone (FLONASE) 50 MCG/ACT nasal spray, Place 1 spray into both nostrils 2 (two) times daily., Disp: , Rfl:  .  fluticasone furoate-vilanterol (BREO ELLIPTA) 200-25 MCG/INH AEPB, Inhale 1 puff into the lungs daily., Disp: , Rfl:  .  glipiZIDE (GLUCOTROL) 5 MG tablet, Take 0.5 tablets (2.5 mg total) by mouth daily before breakfast., Disp: 30 tablet, Rfl: 1 .  nitroGLYCERIN (NITROSTAT) 0.4 MG SL tablet, Place 1 tablet (0.4 mg total) under the tongue every 5 (five) minutes as needed for chest pain., Disp: 25 tablet, Rfl: 6 .  omeprazole (PRILOSEC) 20 MG capsule, Take 20 mg by mouth daily. , Disp: , Rfl:  .  predniSONE (DELTASONE) 5 MG tablet, Take 1 tablet (5 mg total) by mouth daily., Disp: , Rfl:  .  rosuvastatin (CRESTOR) 10 MG tablet, Take 1 tablet (10 mg total) by mouth daily., Disp: 90 tablet, Rfl: 3 .  sennosides-docusate sodium (SENOKOT-S)  8.6-50 MG tablet, Take 1 tablet by mouth daily., Disp: , Rfl:  .  tacrolimus (PROGRAF) 1 MG capsule, Take 1 mg by mouth 2 (two) times daily. Pt takes 5 capsules (62m) in the morning at 9 am and 6 capsules(641m at 9 pm, Disp: , Rfl:  .  Tiotropium Bromide Monohydrate (SPIRIVA RESPIMAT) 1.25 MCG/ACT AERS, Inhale 1 puff into the lungs daily., Disp: , Rfl:  .  triamterene-hydrochlorothiazide (MAXZIDE-25) 37.5-25 MG tablet, Take 1 tablet by mouth daily. (Patient not taking: Reported on 07/01/2018), Disp: 30 tablet, Rfl: 0  Past Medical History: Past Medical History:  Diagnosis Date  . 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 Asthma   . Coronary artery disease    cath 11/2007, grafts patent /  Nuclear, June, 2011, prior inferior MI with mild peri-infarct ischemia, anterior breast attenuation, EF 67%, done for renal transplant assessment / Low level exercise/Lexiscan Myoview (11/2013): EF 67%, no ischemi; normal study  . Diabetes mellitus    type 2  [facility]   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, 2012  . GERD (gastroesophageal reflux disease)   . GI bleed 11/26/2010   January, 2012 , AVM  . Gout   . History of methicillin resistant staphylococcus  aureus (MRSA)   . Hyperlipidemia   . Hyperparathyroidism   . Hypertension   . Myocardial infarction ([place])   . Osteoporosis   . Pneumonia 08/2010    Hospitalization, October, 2011  . Primary osteoarthritis, 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  . S/P kidney transplant    September, 2012, [facility]  . Subacromial impingement of 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    Tobacco Use: Social History   Tobacco Use  Smoking Status Former Smoker  . Packs/day: 1.00  . Years: 8.00  . Pack years: 8.00  . Types: Cigarettes  . Last attempt to quit: 11/21/1983  . Years since quitting: 34.6  Smokeless Tobacco Never Used    Labs: Recent Review Flowsheet Data    Labs for ITP Cardiac and Pulmonary  Rehab Latest Ref Rng & Units 09/18/2016 02/13/2018 02/14/2018 06/05/2018 06/12/2018   Cholestrol 100 - 199 mg/dL 165 - 182 171 -   LDLCALC 0 - 99 mg/dL 80 - 93 75 -   LDLDIRECT mg/dL - - - - -   HDL >39 mg/dL 63 - 69 71 -   Trlycerides 0 - 149 mg/dL 111 - 102 125 -   Hemoglobin A1c 4.0 - 5.6 % - 5.7(H) - - 5.8(A)   PHART 7.350 - 7.400 - - - - -   PCO2ART 35.0 - 45.0 mmHg - - - - -   HCO3 20.0 - 24.0 mEq/L - - - - -   TCO2 0 - 100 mmol/L - - - - -   ACIDBASEDEF 0.0 - 2.0 mmol/L - - - - -   O2SAT % - - - - -      Capillary Blood Glucose: Lab Results  Component Value Date   GLUCAP 153 (H) 02/21/2018   GLUCAP 170 (H) 02/21/2018   GLUCAP 123 (H) 02/21/2018   GLUCAP 119 (H) 02/21/2018   GLUCAP 138 (H) 02/20/2018   POCT Glucose    Row Name 07/09/18 1249             POCT Blood Glucose   Pre-Exercise  87 mg/dL Snack before exercise       Post-Exercise  144 mg/dL       Pre-Exercise #2  124 mg/dL          Pulmonary Assessment Scores: Pulmonary Assessment Scores    Row Name 07/02/18 1655 07/04/18 0730       ADL UCSD   ADL Phase  Entry  Entry    SOB Score total  76  -      CAT Score   CAT Score  30  -      mMRC Score   mMRC Score  -  3       Pulmonary Function Assessment:   Exercise Target Goals: Exercise Program Goal: Individual exercise prescription set using results from initial 6 min walk test and THRR while considering  patient's activity barriers and safety.   Exercise Prescription Goal: Initial exercise prescription builds to 30-45 minutes a day of aerobic activity, 2-3 days per week.  Home exercise guidelines will be given to patient during program as part of exercise prescription that the participant will acknowledge.  Activity Barriers & Risk Stratification: Activity Barriers & Cardiac Risk Stratification - 07/01/18 1041      Activity Barriers & Cardiac Risk Stratification   Activity Barriers  Left Knee Replacement;Joint Problems;Left Hip  Replacement;Shortness of Breath;Arthritis;Back Problems   shoulder surgery bilateral      6 Minute Walk: 6 Minute Walk    Row Name  07/04/18 0728         6 Minute Walk   Phase  Initial     Distance  1113 feet     Walk Time  6 minutes     # of Rest Breaks  0     MPH  2.1     METS  2.61     RPE  12     Perceived Dyspnea   1     Symptoms  Yes (comment)     Comments  used wheelchair/leg fatigue     Resting HR  88 bpm     Resting BP  144/80     Resting Oxygen Saturation   91 %     Exercise Oxygen Saturation  during 6 min walk  89 %     Max Ex. HR  112 bpm     Max Ex. BP  194/80     2 Minute Post BP  148/68       Interval HR   1 Minute HR  98     2 Minute HR  104     3 Minute HR  108     4 Minute HR  108     5 Minute HR  110     6 Minute HR  112     2 Minute Post HR  98     Interval Heart Rate?  Yes       Interval Oxygen   Interval Oxygen?  Yes     Baseline Oxygen Saturation %  91 %     1 Minute Oxygen Saturation %  93 %     1 Minute Liters of Oxygen  2 L     2 Minute Oxygen Saturation %  90 %     2 Minute Liters of Oxygen  2 L     3 Minute Oxygen Saturation %  89 %     3 Minute Liters of Oxygen  2 L     4 Minute Oxygen Saturation %  90 %     4 Minute Liters of Oxygen  2 L     5 Minute Oxygen Saturation %  91 %     5 Minute Liters of Oxygen  2 L     6 Minute Oxygen Saturation %  91 %     6 Minute Liters of Oxygen  2 L     2 Minute Post Oxygen Saturation %  95 %     2 Minute Post Liters of Oxygen  2 L        Oxygen Initial Assessment: Oxygen Initial Assessment - 07/04/18 0730      Initial 6 min Walk   Oxygen Used  Continuous    Liters per minute  2      Program Oxygen Prescription   Program Oxygen Prescription  Continuous;E-Tanks    Liters per minute  2       Oxygen Re-Evaluation: Oxygen Re-Evaluation    Row Name 07/08/18 1621             Program Oxygen Prescription   Program Oxygen Prescription  Continuous;E-Tanks       Liters per minute  2          Home Oxygen   Home Oxygen Device  E-Tanks;Home Concentrator       Sleep Oxygen Prescription  Continuous       Liters per minute  2       Home Exercise Oxygen  Prescription  Continuous       Liters per minute  2       Home at Rest Exercise Oxygen Prescription  Continuous       Compliance with Home Oxygen Use  Yes         Goals/Expected Outcomes   Short Term Goals  To learn and exhibit compliance with exercise, home and travel O2 prescription;To learn and understand importance of monitoring SPO2 with pulse oximeter and demonstrate accurate use of the pulse oximeter.;To learn and understand importance of maintaining oxygen saturations>88%;To learn and demonstrate proper pursed lip breathing techniques or other breathing techniques.;To learn and demonstrate proper use of respiratory medications       Long  Term Goals  Exhibits compliance with exercise, home and travel O2 prescription;Verbalizes importance of monitoring SPO2 with pulse oximeter and return demonstration;Maintenance of O2 saturations>88%;Exhibits proper breathing techniques, such as pursed lip breathing or other method taught during program session;Compliance with respiratory medication;Demonstrates proper use of MDI's       Goals/Expected Outcomes  compliance          Oxygen Discharge (Final Oxygen Re-Evaluation): Oxygen Re-Evaluation - 07/08/18 1621      Program Oxygen Prescription   Program Oxygen Prescription  Continuous;E-Tanks    Liters per minute  2      Home Oxygen   Home Oxygen Device  E-Tanks;Home Concentrator    Sleep Oxygen Prescription  Continuous    Liters per minute  2    Home Exercise Oxygen Prescription  Continuous    Liters per minute  2    Home at Rest Exercise Oxygen Prescription  Continuous    Compliance with Home Oxygen Use  Yes      Goals/Expected Outcomes   Short Term Goals  To learn and exhibit compliance with exercise, home and travel O2 prescription;To learn and understand importance of  monitoring SPO2 with pulse oximeter and demonstrate accurate use of the pulse oximeter.;To learn and understand importance of maintaining oxygen saturations>88%;To learn and demonstrate proper pursed lip breathing techniques or other breathing techniques.;To learn and demonstrate proper use of respiratory medications    Long  Term Goals  Exhibits compliance with exercise, home and travel O2 prescription;Verbalizes importance of monitoring SPO2 with pulse oximeter and return demonstration;Maintenance of O2 saturations>88%;Exhibits proper breathing techniques, such as pursed lip breathing or other method taught during program session;Compliance with respiratory medication;Demonstrates proper use of MDI's    Goals/Expected Outcomes  compliance       Initial Exercise Prescription: Initial Exercise Prescription - 07/04/18 0700      Date of Initial Exercise RX and Referring Provider   Date  07/02/18    Referring Provider  Dr. Nelda Ward      Oxygen   Oxygen  Continuous    Liters  2      Recumbant Bike   Level  2    Watts  10    Minutes  17      NuStep   Level  2    SPM  80    Minutes  17    METs  1.5      Track   Laps  7    Minutes  17      Prescription Details   Frequency (times per week)  2    Duration  Progress to 45 minutes of aerobic exercise without signs/symptoms of physical distress      Intensity   THRR 40-80% of Max Heartrate  58-116  Ratings of Perceived Exertion  11-13    Perceived Dyspnea  0-4      Progression   Progression  Continue progressive overload as per policy without signs/symptoms or physical distress.      Resistance Training   Training Prescription  Yes    Weight  BLUE BANDS    Reps  10-15       Perform Capillary Blood Glucose checks as needed.  Exercise Prescription Changes:  Exercise Prescription Changes    Row Name 07/09/18 1200             Response to Exercise   Blood Pressure (Admit)  148/80       Blood Pressure (Exercise)   160/64       Blood Pressure (Exit)  154/80       Heart Rate (Admit)  88 bpm       Heart Rate (Exercise)  123 bpm       Heart Rate (Exit)  97 bpm       Oxygen Saturation (Admit)  96 %       Oxygen Saturation (Exercise)  91 %       Oxygen Saturation (Exit)  97 %       Rating of Perceived Exertion (Exercise)  15       Perceived Dyspnea (Exercise)  3       Duration  Progress to 45 minutes of aerobic exercise without signs/symptoms of physical distress       Intensity  Other (comment) 40-80% od HRR         Progression   Progression  Continue to progress workloads to maintain intensity without signs/symptoms of physical distress.         Resistance Training   Training Prescription  Yes       Weight  blue bands       Reps  10-15       Time  10 Minutes         Oxygen   Oxygen  Continuous       Liters  2         Recumbant Bike   Level  1       Minutes  17         Track   Minutes  17          Exercise Comments:   Exercise Goals and Review:  Exercise Goals    Row Name 07/01/18 1042             Exercise Goals   Increase Physical Activity  Yes       Intervention  Provide advice, education, support and counseling about physical activity/exercise needs.;Develop an individualized exercise prescription for aerobic and resistive training based on initial evaluation findings, risk stratification, comorbidities and participant's personal goals.       Expected Outcomes  Short Term: Attend rehab on a regular basis to increase amount of physical activity.;Long Term: Add in home exercise to make exercise part of routine and to increase amount of physical activity.;Long Term: Exercising regularly at least 3-5 days a week.       Increase Strength and Stamina  Yes       Intervention  Provide advice, education, support and counseling about physical activity/exercise needs.;Develop an individualized exercise prescription for aerobic and resistive training based on initial evaluation findings,  risk stratification, comorbidities and participant's personal goals.       Expected Outcomes  Short Term: Increase workloads from initial exercise prescription for  resistance, speed, and METs.;Short Term: Perform resistance training exercises routinely during rehab and add in resistance training at home;Long Term: Improve cardiorespiratory fitness, muscular endurance and strength as measured by increased METs and functional capacity (6MWT)       Able to understand and use rate of perceived exertion (RPE) scale  Yes       Intervention  Provide education and explanation on how to use RPE scale       Expected Outcomes  Short Term: Able to use RPE daily in rehab to express subjective intensity level;Long Term:  Able to use RPE to guide intensity level when exercising independently       Able to understand and use Dyspnea scale  Yes       Intervention  Provide education and explanation on how to use Dyspnea scale       Expected Outcomes  Short Term: Able to use Dyspnea scale daily in rehab to express subjective sense of shortness of breath during exertion;Long Term: Able to use Dyspnea scale to guide intensity level when exercising independently       Knowledge and understanding of Target Heart Rate Range (THRR)  Yes       Intervention  Provide education and explanation of THRR including how the numbers were predicted and where they are located for reference       Expected Outcomes  Short Term: Able to state/look up THRR;Short Term: Able to use daily as guideline for intensity in rehab;Long Term: Able to use THRR to govern intensity when exercising independently       Understanding of Exercise Prescription  Yes       Intervention  Provide education, explanation, and written materials on patient's individual exercise prescription       Expected Outcomes  Short Term: Able to explain program exercise prescription;Long Term: Able to explain home exercise prescription to exercise independently           Exercise Goals Re-Evaluation : Exercise Goals Re-Evaluation    [place] Name 07/08/18 1621             Exercise Goal Re-Evaluation   Exercise Goals Review  Increase Physical Activity;Increase Strength and Stamina;Able to understand and use rate of perceived exertion (RPE) scale;Able to understand and use Dyspnea scale;Knowledge and understanding of Target Heart Rate Range (THRR);Understanding of Exercise Prescription       Comments  Patient will start first day of rehab on 07/09/18          Discharge Exercise Prescription (Final Exercise Prescription Changes): Exercise Prescription Changes - 07/09/18 1200      Response to Exercise   Blood Pressure (Admit)  148/80    Blood Pressure (Exercise)  160/64    Blood Pressure (Exit)  154/80    Heart Rate (Admit)  88 bpm    Heart Rate (Exercise)  123 bpm    Heart Rate (Exit)  97 bpm    Oxygen Saturation (Admit)  96 %    Oxygen Saturation (Exercise)  91 %    Oxygen Saturation (Exit)  97 %    Rating of Perceived Exertion (Exercise)  15    Perceived Dyspnea (Exercise)  3    Duration  Progress to 45 minutes of aerobic exercise without signs/symptoms of physical distress    Intensity  Other (comment)   40-80% od HRR     Progression   Progression  Continue to progress workloads to maintain intensity without signs/symptoms of physical distress.      Resistance  Training   Training Prescription  Yes    Weight  blue bands    Reps  10-15    Time  10 Minutes      Oxygen   Oxygen  Continuous    Liters  2      Recumbant Bike   Level  1    Minutes  17      Track   Minutes  17       Nutrition:  Target Goals: Understanding of nutrition guidelines, daily intake of sodium <1561m, cholesterol <2052m calories 30% from fat and 7% or less from saturated fats, daily to have 5 or more servings of fruits and vegetables.  Biometrics:    Nutrition Therapy Plan and Nutrition Goals: Nutrition Therapy & Goals - 07/02/18 1350      Nutrition  Therapy   Diet  consistent carbohydrate heart healthy      Personal Nutrition Goals   Nutrition Goal  The pt will recognize symptoms that can interfere with adequate oral intake    Personal Goal #2  pt will consume high-energy, high-nutrient dense beverages when necessary to compensate for decreased oral intake      Intervention Plan   Intervention  Prescribe, educate and counsel regarding individualized specific dietary modifications aiming towards targeted core components such as weight, hypertension, lipid management, diabetes, heart failure and other comorbidities.    Expected Outcomes  Short Term Goal: Understand basic principles of dietary content, such as calories, fat, sodium, cholesterol and nutrients.       Nutrition Assessments: Nutrition Assessments - 07/02/18 1351      Rate Your Plate Scores   Pre Score  46       Nutrition Goals Re-Evaluation:   Nutrition Goals Discharge (Final Nutrition Goals Re-Evaluation):   Psychosocial: Target Goals: Acknowledge presence or absence of significant depression and/or stress, maximize coping skills, provide positive support system. Participant is able to verbalize types and ability to use techniques and skills needed for reducing stress and depression.  Initial Review & Psychosocial Screening: Initial Psych Review & Screening - 07/01/18 1045      Initial Review   Current issues with  None Identified      Family Dynamics   Good Support System?  No    Strains  Intra-family strains    Comments  [place] or concerned about [place] health.  [place] with errands.       Barriers   Psychosocial barriers to participate in program  The patient should benefit from training in stress management and relaxation.      Screening Interventions   Interventions  Encouraged to exercise    Expected Outcomes  Short Term goal: Identification and review with participant of any Quality of Life or  Depression concerns found by scoring the questionnaire.;Long Term goal: The participant improves quality of Life and PHQ9 Scores as seen by post scores and/or verbalization of changes       Quality of Life Scores:  Scores of 19 and below usually indicate a poorer quality of life in these areas.  A difference of  2-3 points is a clinically meaningful difference.  A difference of 2-3 points in the total score of the Quality of Life Index has been associated with significant improvement in overall quality of life, self-image, physical symptoms, and general health in studies assessing change in quality of life.  PHQ-9: Recent Review Flowsheet Data    [facility]  07/01/2018   Decreased Interest 0   Down, Depressed, Hopeless 0   PHQ - 2 Score 0   Altered sleeping 0   Tired, decreased energy 2   Change in appetite 0   Feeling bad or failure about yourself  0   Trouble concentrating 0   Moving slowly or fidgety/restless 0   PHQ-9 Score 2   Difficult doing work/chores Not difficult at all     Interpretation of Total Score  Total Score Depression Severity:  1-4 = Minimal depression, 5-9 = Mild depression, 10-14 = Moderate depression, 15-19 = Moderately severe depression, 20-27 = Severe depression   Psychosocial Evaluation and Intervention: Psychosocial Evaluation - 07/09/18 1732      Psychosocial Evaluation & Interventions   Interventions  Stress management education;Relaxation education;Encouraged to exercise with the program and follow exercise prescription    Comments  althoug pt has intra family strains, pt has resolved this wihin herself.    Expected Outcomes  Pt will continue to display positive and healthy coping skills and have peace with the relationship she has with her children    Continue Psychosocial Services   Follow up required by staff       Psychosocial Re-Evaluation: Psychosocial Re-Evaluation    [place] Name 07/01/18 1052 07/09/18 1732            Psychosocial Re-Evaluation   Current issues with  None Identified  None Identified      Comments  no identifiable barriers  no identifiable barriers      Expected Outcomes  -  Pt will remain at peace in regards to her relationship with her children.  Pt will have a hopefull outlook on her future.      Interventions  Relaxation education;Encouraged to attend Pulmonary Rehabilitation for the exercise;Stress management education  -      Continue Psychosocial Services   -  Follow up required by staff         Psychosocial Discharge (Final Psychosocial Re-Evaluation): Psychosocial Re-Evaluation - 07/09/18 1732      Psychosocial Re-Evaluation   Current issues with  None Identified    Comments  no identifiable barriers    Expected Outcomes  Pt will remain at peace in regards to her relationship with her children.  Pt will have a hopefull outlook on her future.    Continue Psychosocial Services   Follow up required by staff       Education: Education Goals: Education classes will be provided on a weekly basis, covering required topics. Participant will state understanding/return demonstration of topics presented.  Learning Barriers/Preferences: Learning Barriers/Preferences - 07/01/18 1053      Learning Barriers/Preferences   Learning Barriers  Sight    Learning Preferences  Group Instruction;Individual Instruction;Verbal Instruction;Written Material       Education Topics: Risk Factor Reduction:  -Group instruction that is supported by a PowerPoint presentation. Instructor discusses the definition of a risk factor, different risk factors for pulmonary disease, and how the heart and lungs work together.     Nutrition for Pulmonary Patient:  -Group instruction provided by PowerPoint slides, verbal discussion, and written materials to support subject matter. The instructor gives an explanation and review of healthy diet recommendations, which includes a discussion on weight management,  recommendations for fruit and vegetable consumption, as well as protein, fluid, caffeine, fiber, sodium, sugar, and alcohol. Tips for eating when patients are short of breath are discussed.   Pursed Lip Breathing:  -Group instruction that is supported by demonstration and  informational handouts. Instructor discusses the benefits of pursed lip and diaphragmatic breathing and detailed demonstration on how to preform both.     Oxygen Safety:  -Group instruction provided by PowerPoint, verbal discussion, and written material to support subject matter. There is an overview of "What is Oxygen" and "Why do we need it".  Instructor also reviews how to create a safe environment for oxygen use, the importance of using oxygen as prescribed, and the risks of noncompliance. There is a brief discussion on traveling with oxygen and resources the patient may utilize.   Oxygen Equipment:  -Group instruction provided by [facility] Staff utilizing handouts, written materials, and equipment demonstrations.   Signs and Symptoms:  -Group instruction provided by written material and verbal discussion to support subject matter. Warning signs and symptoms of infection, stroke, and heart attack are reviewed and when to call the physician/911 reinforced. Tips for preventing the spread of infection discussed.   Advanced Directives:  -Group instruction provided by verbal instruction and written material to support subject matter. Instructor reviews Advanced Directive laws and proper instruction for filling out document.   Pulmonary Video:  -Group video education that reviews the importance of medication and oxygen compliance, exercise, good nutrition, pulmonary hygiene, and pursed lip and diaphragmatic breathing for the pulmonary patient.   Exercise for the Pulmonary Patient:  -Group instruction that is supported by a PowerPoint presentation. Instructor discusses benefits of exercise, core components of exercise,  frequency, duration, and intensity of an exercise routine, importance of utilizing pulse oximetry during exercise, safety while exercising, and options of places to exercise outside of rehab.     Pulmonary Medications:  -Verbally interactive group education provided by instructor with focus on inhaled medications and proper administration.   Anatomy and Physiology of the Respiratory System and Intimacy:  -Group instruction provided by PowerPoint, verbal discussion, and written material to support subject matter. Instructor reviews respiratory cycle and anatomical components of the respiratory system and their functions. Instructor also reviews differences in obstructive and restrictive respiratory diseases with examples of each. Intimacy, Sex, and Sexuality differences are reviewed with a discussion on how relationships can change when diagnosed with pulmonary disease. Common sexual concerns are reviewed.   MD DAY -A group question and answer session with a medical doctor that allows participants to ask questions that relate to their pulmonary disease state.   OTHER EDUCATION -Group or individual verbal, written, or video instructions that support the educational goals of the pulmonary rehab program.   Holiday Eating Survival Tips:  -Group instruction provided by PowerPoint slides, verbal discussion, and written materials to support subject matter. The instructor gives patients tips, tricks, and techniques to help them not only survive but enjoy the holidays despite the onslaught of food that accompanies the holidays.   Knowledge Questionnaire Score: Knowledge Questionnaire Score - 07/02/18 1656      Knowledge Questionnaire Score   Pre Score  17/18       Core Components/Risk Factors/Patient Goals at Admission: Personal Goals and Risk Factors at Admission - 07/01/18 1053      Core Components/Risk Factors/Patient Goals on Admission    Weight Management  Yes;Weight Maintenance     Intervention  Weight Management: Develop a combined nutrition and exercise program designed to reach desired caloric intake, while maintaining appropriate intake of nutrient and fiber, sodium and fats, and appropriate energy expenditure required for the weight goal.;Weight Management: Provide education and appropriate resources to help participant work on and attain dietary goals.;Weight Management/Obesity: Establish reasonable  short term and long term weight goals.    Admit Weight  156 lb 4.9 oz (70.9 kg)    Expected Outcomes  Understanding of distribution of calorie intake throughout the day with the consumption of 4-5 meals/snacks;Understanding recommendations for meals to include 15-35% energy as protein, 25-35% energy from fat, 35-60% energy from carbohydrates, less than 282m of dietary cholesterol, 20-35 gm of total fiber daily;Weight Maintenance: Understanding of the daily nutrition guidelines, which includes 25-35% calories from fat, 7% or less cal from saturated fats, less than 2048mcholesterol, less than 1.5gm of sodium, & 5 or more servings of fruits and vegetables daily;Short Term: Continue to assess and modify interventions until short term weight is achieved;Long Term: Adherence to nutrition and physical activity/exercise program aimed toward attainment of established weight goal    Improve shortness of breath with ADL's  Yes    Intervention  Provide education, individualized exercise plan and daily activity instruction to help decrease symptoms of SOB with activities of daily living.    Expected Outcomes  Short Term: Improve cardiorespiratory fitness to achieve a reduction of symptoms when performing ADLs;Long Term: Be able to perform more ADLs without symptoms or delay the onset of symptoms    Diabetes  Yes    Intervention  Provide education about signs/symptoms and action to take for hypo/hyperglycemia.;Provide education about proper nutrition, including hydration, and aerobic/resistive  exercise prescription along with prescribed medications to achieve blood glucose in normal ranges: Fasting glucose 65-99 mg/dL    Expected Outcomes  Short Term: Participant verbalizes understanding of the signs/symptoms and immediate care of hyper/hypoglycemia, proper foot care and importance of medication, aerobic/resistive exercise and nutrition plan for blood glucose control.;Long Term: Attainment of HbA1C < 7%.    Hypertension  Yes    Intervention  Provide education on lifestyle modifcations including regular physical activity/exercise, weight management, moderate sodium restriction and increased consumption of fresh fruit, vegetables, and low fat dairy, alcohol moderation, and smoking cessation.;Monitor prescription use compliance.    Expected Outcomes  Short Term: Continued assessment and intervention until BP is < 140/9076mG in hypertensive participants. < 130/92m87m in hypertensive participants with diabetes, heart failure or chronic kidney disease.;Long Term: Maintenance of blood pressure at goal levels.       Core Components/Risk Factors/Patient Goals Review:  Goals and Risk Factor Review    Row Name 07/09/18 1734 07/10/18 0951           Core Components/Risk Factors/Patient Goals Review   Personal Goals Review  Weight Management/Obesity;Improve shortness of breath with ADL's;Increase knowledge of respiratory medications and ability to use respiratory devices properly.;Heart Failure;Hypertension;Diabetes;Develop more efficient breathing techniques such as purse lipped breathing and diaphragmatic breathing and practicing self-pacing with activity.  -      Review  Pt started pulmonary rehab today - 8/20.  Unable to adequately assess pt progress toward pulmonary rehab goals.  Pt started pulmonary rehab today - 8/20.  Unable to adequately assess pt progress toward pulmonary rehab goals. Anticipate pt will show measurable progress during the next 30 day assessment.      Expected Outcomes  See  Admission Goals  See Admission Goals/Outocmes         Core Components/Risk Factors/Patient Goals at Discharge (Final Review):  Goals and Risk Factor Review - 07/10/18 0951      Core Components/Risk Factors/Patient Goals Review   Review  Pt started pulmonary rehab today - 8/20.  Unable to adequately assess pt progress toward pulmonary rehab goals. Anticipate pt will show  measurable progress during the next 30 day assessment.    Expected Outcomes  See Admission Goals/Outocmes       ITP Comments: ITP Comments    Row Name 07/01/18 1025 07/10/18 0951         ITP Comments  Dr. Jennet Maduro, medicare Director  Dr. Jennet Maduro, medicare Director         Comments:  Pt completed 1 exercise session. Cherre Huger, BSN Cardiac and Training and development officer

## 2018-07-09 NOTE — Progress Notes (Signed)
Daily Session Note  Patient Details  Name: [name] MRN: [id number] Date of Birth: [birth year] Referring Provider:     Pulmonary Rehab Walk Test from 07/02/2018 in [place]  Referring Provider  Dr. Nelda Marseille      Encounter Date: 07/09/2018  Check In: Session Check In - 07/09/18 1238      Check-In   Supervising physician immediately available to respond to emergencies  [facility] immediately available    Physician(s)  Dr. Florene Glen    Location  [facility]    Staff Present  Su Hilt, MS, ACSM RCEP, Exercise Physiologist;Glorian Mcdonell Ysidro Evert, RN;Carlette Wilber Oliphant, RN, BSN;Ramon Dredge, RN, MHA    Medication changes reported      No    Fall or balance concerns reported     No    Tobacco Cessation  No Change    Warm-up and Cool-down  Performed as group-led instruction    Resistance Training Performed  No    VAD Patient?  No    PAD/SET Patient?  No      Pain Assessment   Currently in Pain?  No/denies       Capillary Blood Glucose: No results found for this or any previous visit (from the past 24 hour(s)). POCT Glucose - 07/09/18 1249      POCT Blood Glucose   Pre-Exercise  87 mg/dL   Snack before exercise   Post-Exercise  144 mg/dL    Pre-Exercise #2  124 mg/dL      Exercise Prescription Changes - 07/09/18 1200      Response to Exercise   Blood Pressure (Admit)  148/80    Blood Pressure (Exercise)  160/64    Blood Pressure (Exit)  154/80    Heart Rate (Admit)  88 bpm    Heart Rate (Exercise)  123 bpm    Heart Rate (Exit)  97 bpm    Oxygen Saturation (Admit)  96 %    Oxygen Saturation (Exercise)  91 %    Oxygen Saturation (Exit)  97 %    Rating of Perceived Exertion (Exercise)  15    Perceived Dyspnea (Exercise)  3    Duration  Progress to 45 minutes of aerobic exercise without signs/symptoms of physical distress    Intensity  Other (comment)   40-80% od HRR     Progression   Progression  Continue to  progress workloads to maintain intensity without signs/symptoms of physical distress.      Resistance Training   Training Prescription  Yes    Weight  blue bands    Reps  10-15    Time  10 Minutes      Oxygen   Oxygen  Continuous    Liters  2      Recumbant Bike   Level  1    Minutes  17      Track   Minutes  17       Social History   Tobacco Use  Smoking Status Former Smoker  . Packs/day: 1.00  . Years: 8.00  . Pack years: 8.00  . Types: Cigarettes  . Last attempt to quit: 11/21/1983  . Years since quitting: 34.6  Smokeless Tobacco Never Used    Goals Met:  Exercise tolerated well No report of cardiac concerns or symptoms Strength training completed today  Goals Unmet:  Not Applicable  Comments: Service time is from 1030 to 1220    Dr. Rush Farmer is Medical Director for  Pulmonary Rehab at [facility].

## 2018-07-11 ENCOUNTER — Encounter (HOSPITAL_COMMUNITY)
Admission: RE | Admit: 2018-07-11 | Discharge: 2018-07-11 | Disposition: A | Discharge status: Home / Self Care | Payer: Medicare Other | Source: Ambulatory Visit | Attending: Pulmonary Disease | Admitting: Pulmonary Disease

## 2018-07-11 ENCOUNTER — Encounter (HOSPITAL_COMMUNITY): Payer: Medicare Other

## 2018-07-11 DIAGNOSIS — J432 Centrilobular emphysema: Secondary | ICD-10-CM | POA: Diagnosis not present

## 2018-07-11 NOTE — Progress Notes (Signed)
Daily Session Note  Patient Details  Name: ENSLEE BIBBINS MRN: [id number] Date of Birth: 11-30-1940 Referring Provider:     Pulmonary Rehab Walk Test from 07/02/2018 in [place]  Referring Provider  Dr. Nelda Marseille      Encounter Date: 07/11/2018  Check In: Session Check In - 07/11/18 1219      Check-In   Supervising physician immediately available to respond to emergencies  [facility] immediately available    Physician(s)  Dr. Algis Liming    Location  [facility]    Staff Present  Su Hilt, MS, ACSM RCEP, Exercise Physiologist;Schylar Allard Ysidro Evert, RN;Carlette Wilber Oliphant, RN, BSN;Ramon Dredge, RN, MHA    Medication changes reported      No    Fall or balance concerns reported     No    Tobacco Cessation  No Change    Warm-up and Cool-down  Performed as group-led instruction    Resistance Training Performed  Yes    VAD Patient?  No    PAD/SET Patient?  No      Pain Assessment   Currently in Pain?  No/denies    Multiple Pain Sites  No       Capillary Blood Glucose: No results found for this or any previous visit (from the past 24 hour(s)).    Social History   Tobacco Use  Smoking Status Former Smoker  . Packs/day: 1.00  . Years: 8.00  . Pack years: 8.00  . Types: Cigarettes  . Last attempt to quit: 11/21/1983  . Years since quitting: 34.6  Smokeless Tobacco Never Used    Goals Met:  Exercise tolerated well No report of cardiac concerns or symptoms Strength training completed today  Goals Unmet:  Not Applicable  Comments: Service time is from 1030 to 1215    Dr. Rush Farmer is Medical Director for Pulmonary Rehab at [facility].

## 2018-07-16 ENCOUNTER — Encounter (HOSPITAL_COMMUNITY)
Admission: RE | Admit: 2018-07-16 | Discharge: 2018-07-16 | Disposition: A | Discharge status: Home / Self Care | Payer: Medicare Other | Source: Ambulatory Visit | Attending: Pulmonary Disease | Admitting: Pulmonary Disease

## 2018-07-16 DIAGNOSIS — J432 Centrilobular emphysema: Secondary | ICD-10-CM

## 2018-07-16 NOTE — Progress Notes (Signed)
Daily Session Note  Patient Details  Name: DMIYAH LISCANO MRN: [id number] Date of Birth: [birth year] Referring Provider:     Pulmonary Rehab Walk Test from 07/02/2018 in [place]  Referring Provider  Dr. Nelda Marseille      Encounter Date: 07/16/2018  Check In: Session Check In - 07/16/18 1155      Check-In   Supervising physician immediately available to respond to emergencies  [facility] immediately available    Physician(s)  Dr. Jonnie Finner     Location  [facility]    Staff Present  Su Hilt, MS, ACSM RCEP, Exercise Physiologist;Lisa Ysidro Evert, RN;Carlette Wilber Oliphant, RN, Deland Pretty, MS, ACSM CEP, Exercise Physiologist;Joann Rion, RN, BSN    Fall or balance concerns reported     No    Tobacco Cessation  No Change    Warm-up and Cool-down  Performed as group-led instruction    Resistance Training Performed  Yes    VAD Patient?  No    PAD/SET Patient?  No      Pain Assessment   Currently in Pain?  No/denies       Capillary Blood Glucose: No results found for this or any previous visit (from the past 24 hour(s)).    Social History   Tobacco Use  Smoking Status Former Smoker  . Packs/day: 1.00  . Years: 8.00  . Pack years: 8.00  . Types: Cigarettes  . Last attempt to quit: 11/21/1983  . Years since quitting: 34.6  Smokeless Tobacco Never Used    Goals Met:  Exercise tolerated well  Goals Unmet:  Not Applicable  Comments: Service time is from 10:30a to 12:!5p    Dr. Rush Farmer is Medical Director for Pulmonary Rehab at [facility].

## 2018-07-18 ENCOUNTER — Encounter (HOSPITAL_COMMUNITY)
Admission: RE | Admit: 2018-07-18 | Discharge: 2018-07-18 | Disposition: A | Discharge status: Home / Self Care | Payer: Medicare Other | Source: Ambulatory Visit | Attending: Pulmonary Disease | Admitting: Pulmonary Disease

## 2018-07-18 DIAGNOSIS — J432 Centrilobular emphysema: Secondary | ICD-10-CM | POA: Diagnosis not present

## 2018-07-18 NOTE — Progress Notes (Signed)
Daily Session Note  Patient Details  Name: [name] MRN: [id number] Date of Birth: May 13, 1941 Referring Provider:     Pulmonary Rehab Walk Test from 07/02/2018 in [place]  Referring Provider  Dr. Nelda Marseille      Encounter Date: 07/18/2018  Check In: Session Check In - 07/18/18 1020      Check-In   Supervising physician immediately available to respond to emergencies  [facility] immediately available    Physician(s)  Dr. Florene Glen    Location  [facility]    Staff Present  Su Hilt, MS, ACSM RCEP, Exercise Physiologist;Carlette Wilber Oliphant, RN, BSN;Ramon Dredge, RN, MHA    Medication changes reported      No    Fall or balance concerns reported     No    Tobacco Cessation  No Change    Warm-up and Cool-down  Performed as group-led instruction    Resistance Training Performed  Yes    VAD Patient?  No    PAD/SET Patient?  No      Pain Assessment   Currently in Pain?  No/denies    Multiple Pain Sites  No       Capillary Blood Glucose: No results found for this or any previous visit (from the past 24 hour(s)).    Social History   Tobacco Use  Smoking Status Former Smoker  . Packs/day: 1.00  . Years: 8.00  . Pack years: 8.00  . Types: Cigarettes  . Last attempt to quit: 11/21/1983  . Years since quitting: 34.6  Smokeless Tobacco Never Used    Goals Met:  Exercise tolerated well  Goals Unmet:  Not Applicable  Comments: Service time is from 10:30a to 12:45p    Dr. Rush Farmer is Medical Director for Pulmonary Rehab at [facility].

## 2018-07-23 ENCOUNTER — Encounter (HOSPITAL_COMMUNITY)
Admission: RE | Admit: 2018-07-23 | Discharge: 2018-07-23 | Disposition: A | Discharge status: Home / Self Care | Payer: Medicare Other | Source: Ambulatory Visit | Attending: Pulmonary Disease | Admitting: Pulmonary Disease

## 2018-07-23 VITALS — Wt 154.3 lb

## 2018-07-23 DIAGNOSIS — J432 Centrilobular emphysema: Secondary | ICD-10-CM | POA: Insufficient documentation

## 2018-07-23 NOTE — Progress Notes (Signed)
Daily Session Note  Patient Details  Name: TAHISHA HAKIM MRN: [id number] Date of Birth: 1941/03/16 Referring Provider:     Pulmonary Rehab Walk Test from 07/02/2018 in [place]  Referring Provider  Dr. Nelda Marseille      Encounter Date: 07/23/2018  Check In: Session Check In - 07/23/18 1026      Check-In   Supervising physician immediately available to respond to emergencies  [facility] immediately available    Physician(s)  Dr. Karleen Hampshire    Location  [facility]    Staff Present  Su Hilt, MS, ACSM RCEP, Exercise Physiologist;Carlette Wilber Oliphant, RN, BSN;Ramon Dredge, RN, MHA;Lisa Ysidro Evert, RN    Medication changes reported      No    Fall or balance concerns reported     No    Tobacco Cessation  No Change    Warm-up and Cool-down  Performed as group-led instruction    Resistance Training Performed  Yes    VAD Patient?  No    PAD/SET Patient?  No      Pain Assessment   Currently in Pain?  No/denies    Multiple Pain Sites  No       Capillary Blood Glucose: No results found for this or any previous visit (from the past 24 hour(s)).  Exercise Prescription Changes - 07/23/18 1200      Response to Exercise   Blood Pressure (Admit)  148/70    Blood Pressure (Exercise)  168/68    Blood Pressure (Exit)  122/60    Heart Rate (Admit)  88 bpm    Heart Rate (Exercise)  103 bpm    Heart Rate (Exit)  90 bpm    Oxygen Saturation (Admit)  99 %    Oxygen Saturation (Exercise)  92 %    Oxygen Saturation (Exit)  98 %    Rating of Perceived Exertion (Exercise)  11    Perceived Dyspnea (Exercise)  1    Duration  Progress to 45 minutes of aerobic exercise without signs/symptoms of physical distress    Intensity  Other (comment)   40-80% od HRR     Progression   Progression  Continue to progress workloads to maintain intensity without signs/symptoms of physical distress.      Resistance Training   Training Prescription  Yes     Weight  blue bands    Reps  10-15    Time  10 Minutes      Oxygen   Oxygen  Continuous    Liters  2      Recumbant Bike   Level  2    Minutes  17      NuStep   Level  2    SPM  80    Minutes  17    METs  1.8      Track   Laps  9    Minutes  17       Social History   Tobacco Use  Smoking Status Former Smoker  . Packs/day: 1.00  . Years: 8.00  . Pack years: 8.00  . Types: Cigarettes  . Last attempt to quit: 11/21/1983  . Years since quitting: 34.6  Smokeless Tobacco Never Used    Goals Met:  Exercise tolerated well  Goals Unmet:  Not Applicable  Comments: Service time is from 10:30a to 12:15p    Dr. Rush Farmer is Medical Director for Pulmonary Rehab at [facility].

## 2018-07-25 ENCOUNTER — Encounter (HOSPITAL_COMMUNITY)
Admission: RE | Admit: 2018-07-25 | Discharge: 2018-07-25 | Disposition: A | Discharge status: Home / Self Care | Payer: Medicare Other | Source: Ambulatory Visit | Attending: Pulmonary Disease | Admitting: Pulmonary Disease

## 2018-07-25 DIAGNOSIS — J432 Centrilobular emphysema: Secondary | ICD-10-CM | POA: Diagnosis not present

## 2018-07-25 NOTE — Progress Notes (Signed)
Daily Session Note  Patient Details  Name: [name] MRN: [id number] Date of Birth: 08/07/1941 Referring Provider:     Pulmonary Rehab Walk Test from 07/02/2018 in [place]  Referring Provider  Dr. Nelda Marseille      Encounter Date: 07/25/2018  Check In: Session Check In - 07/25/18 1136      Check-In   Supervising physician immediately available to respond to emergencies  [facility] immediately available    Physician(s)  Dr. Darrick Meigs     Location  [facility]    Staff Present  Maurice Small, RN, BSN;Joann Rion, RN, BSN;Ramon Dredge, RN, MHA;Wilbert Schouten Ysidro Evert, RN    Medication changes reported      No    Fall or balance concerns reported     No    Tobacco Cessation  No Change    Warm-up and Cool-down  Performed as group-led instruction    Resistance Training Performed  Yes    VAD Patient?  No    PAD/SET Patient?  No      Pain Assessment   Currently in Pain?  No/denies       Capillary Blood Glucose: No results found for this or any previous visit (from the past 24 hour(s)).    Social History   Tobacco Use  Smoking Status Former Smoker  . Packs/day: 1.00  . Years: 8.00  . Pack years: 8.00  . Types: Cigarettes  . Last attempt to quit: 11/21/1983  . Years since quitting: 34.6  Smokeless Tobacco Never Used    Goals Met:  Exercise tolerated well No report of cardiac concerns or symptoms Strength training completed today  Goals Unmet:  Not Applicable  Comments: Service time is from 1030 to 1150    Dr. Rush Farmer is Medical Director for Pulmonary Rehab at [facility].

## 2018-07-26 NOTE — Progress Notes (Signed)
[name] 77 y.o. female   DOB: 12-Dec-1940 MRN: [id number]          Nutrition 1. Centrilobular emphysema ([place])    Past Medical History:  Diagnosis Date  . 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 Asthma   . Coronary artery disease    cath 11/2007, grafts patent /  Nuclear, June, 2011, prior inferior MI with mild peri-infarct ischemia, anterior breast attenuation, EF 67%, done for renal transplant assessment / Low level exercise/Lexiscan Myoview (11/2013): EF 67%, no ischemi; normal study  . Diabetes mellitus    type 2  . ESRD on dialysis [facility])    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, 2012  . GERD (gastroesophageal reflux disease)   . GI bleed 11/26/2010   January, 2012 , AVM  . Gout   . History of methicillin resistant staphylococcus aureus (MRSA)   . Hyperlipidemia   . Hyperparathyroidism   . Hypertension   . Myocardial infarction ([place])   . Osteoporosis   . Pneumonia 08/2010    Hospitalization, October, 2011  . Primary osteoarthritis, 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  . S/P kidney transplant    September, 2012, [facility]  . Subacromial impingement of 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   Meds reviewed.   Ht: Ht Readings from Last 1 Encounters:  07/01/18 5\' 3"  (1.6 m)     Wt:  Wt Readings from Last 3 Encounters:  07/23/18 154 lb 5.2 oz (70 kg)  07/09/18 154 lb 12.2 oz (70.2 kg)  07/01/18 156 lb 4.9 oz (70.9 kg)     BMI: There is no height or weight on file to calculate BMI.     Current tobacco use? no  Labs:  Lipid Panel     Component Value Date/Time   CHOL 171 06/05/2018 0826   TRIG 125 06/05/2018 0826   HDL 71 06/05/2018 0826   CHOLHDL 2.4 06/05/2018 0826   CHOLHDL 2.6 02/14/2018 0223   VLDL 20 02/14/2018 0223   LDLCALC 75 06/05/2018 0826   LDLDIRECT 136.3 02/26/2012 1715    Lab Results  Component Value Date   HGBA1C 5.8 (A) 06/12/2018   Note Spoke with pt. Pt is overweight. Pt eats 3 meals a day  and 1-2 snacks; most prepared at home.  Making healthy food choices the majority of the time.  Pt's Rate Your Plate results reviewed with pt. Pt does not avoid salty food; uses canned/ convenience food.  Pt adds salt to food. The role of sodium in lung disease reviewed with pt. Educated pt on how to read labels and distributed label reading handout. Additionally set goal with patient to limit sodium intake, and start to watch saturated fat and refined carbohydrates. Pt expressed understanding of the information reviewed.  Nutrition Diagnosis  Overweight related to excessive energy intake as evidenced by a BMI of Body mass index is 27.69 kg/m.  Nutrition Intervention ? Pt's individual nutrition plan and goals reviewed with pt. ? Benefits of adopting healthy eating habits discussed when pt's Rate Your Plate reviewed.  Goal(s) 1. Pt to identify and limit food sources of sodium,saturated fat, trans fat, refined carbohydrates 2. The pt will recognize symptoms that can interfere with adequate oral intake, such as shortness of breath, N/V, early satiety, fatigue, ability to secure and prepare food, taste and smell changes, chewing/swallowing difficulties, and/ or pain when eating. 3. The pt will consume high-energy, high-nutrient dense beverages when necessary to  compensate for decreased oral intake of solid foods. 4. The pt will have family and friends shop for food when necessary so that nourishing foods are always available at home. 5. Identify food quantities necessary to achieve wt loss of  -2# per week to a goal wt loss of 2.7-10.9 kg (6-15 lb) at graduation from pulmonary rehab. 6. Describe the benefit of including fruits, vegetables, whole grains, and low-fat dairy products in a healthy meal plan.   Plan:  Pt to attend Pulmonary Nutrition class Will provide client-centered nutrition education as part of interdisciplinary care.    Monitor and Evaluate progress toward nutrition goal with  team.   Laurina Bustle, MS, RD, LDN 07/26/2018 11:47 AM

## 2018-07-30 ENCOUNTER — Encounter (HOSPITAL_COMMUNITY)
Admission: RE | Admit: 2018-07-30 | Discharge: 2018-07-30 | Disposition: A | Discharge status: Home / Self Care | Payer: Medicare Other | Source: Ambulatory Visit | Attending: Pulmonary Disease | Admitting: Pulmonary Disease

## 2018-07-30 DIAGNOSIS — J432 Centrilobular emphysema: Secondary | ICD-10-CM

## 2018-07-30 NOTE — Progress Notes (Signed)
Daily Session Note  Patient Details  Name: ORIA KLIMAS MRN: [id number] Date of Birth: 03-23-1941 Referring Provider:     Pulmonary Rehab Walk Test from 07/02/2018 in [place]  Referring Provider  Dr. Nelda Marseille      Encounter Date: 07/30/2018  Check In: Session Check In - 07/30/18 1101      Check-In   Supervising physician immediately available to respond to emergencies  [facility] immediately available    Physician(s)  Dr. Darrick Meigs    Location  [facility]    Staff Present  Maurice Small, RN, BSN;Demontae Antunes, MS, ACSM RCEP, Exercise Physiologist;Lisa Ysidro Evert, RN    Medication changes reported      No    Fall or balance concerns reported     No    Tobacco Cessation  No Change    Warm-up and Cool-down  Performed as group-led instruction    Resistance Training Performed  Yes    VAD Patient?  No    PAD/SET Patient?  No      Pain Assessment   Currently in Pain?  No/denies    Multiple Pain Sites  No       Capillary Blood Glucose: No results found for this or any previous visit (from the past 24 hour(s)).    Social History   Tobacco Use  Smoking Status Former Smoker  . Packs/day: 1.00  . Years: 8.00  . Pack years: 8.00  . Types: Cigarettes  . Last attempt to quit: 11/21/1983  . Years since quitting: 34.7  Smokeless Tobacco Never Used    Goals Met:  Exercise tolerated well  Goals Unmet:  Not Applicable  Comments: Service time is from 10:30a to 12:20p    Dr. Rush Farmer is Medical Director for Pulmonary Rehab at [facility].

## 2018-08-01 ENCOUNTER — Encounter (HOSPITAL_COMMUNITY)
Admission: RE | Admit: 2018-08-01 | Discharge: 2018-08-01 | Disposition: A | Discharge status: Home / Self Care | Payer: Medicare Other | Source: Ambulatory Visit | Attending: Pulmonary Disease | Admitting: Pulmonary Disease

## 2018-08-01 DIAGNOSIS — J432 Centrilobular emphysema: Secondary | ICD-10-CM

## 2018-08-01 NOTE — Progress Notes (Signed)
Daily Session Note  Patient Details  Name: LASHAUNDRA LEHRMANN MRN: [id number] Date of Birth: September 05, 1941 Referring Provider:     Pulmonary Rehab Walk Test from 07/02/2018 in [place]  Referring Provider  Dr. Nelda Marseille      Encounter Date: 08/01/2018  Check In: Session Check In - 08/01/18 1223      Check-In   Supervising physician immediately available to respond to emergencies  [facility] immediately available    Physician(s)  Dr. Dyann Kief    Location  [facility]    Staff Present  Maurice Small, RN, BSN;Dariann Huckaba, MS, ACSM RCEP, Exercise Physiologist;Lisa Ysidro Evert, Felipe Drone, RN, MHA    Medication changes reported      No    Fall or balance concerns reported     No    Tobacco Cessation  No Change    Warm-up and Cool-down  Performed as group-led instruction    Resistance Training Performed  Yes    VAD Patient?  No    PAD/SET Patient?  No      Pain Assessment   Currently in Pain?  No/denies    Multiple Pain Sites  No       Capillary Blood Glucose: No results found for this or any previous visit (from the past 24 hour(s)).    Social History   Tobacco Use  Smoking Status Former Smoker  . Packs/day: 1.00  . Years: 8.00  . Pack years: 8.00  . Types: Cigarettes  . Last attempt to quit: 11/21/1983  . Years since quitting: 34.7  Smokeless Tobacco Never Used    Goals Met:  Exercise tolerated well  Goals Unmet:  Not Applicable  Comments: Service time is from 10:30a to 12:05p    Dr. Rush Farmer is Medical Director for Pulmonary Rehab at [facility].

## 2018-08-06 ENCOUNTER — Encounter (HOSPITAL_COMMUNITY)
Admission: RE | Admit: 2018-08-06 | Discharge: 2018-08-06 | Disposition: A | Discharge status: Home / Self Care | Payer: Medicare Other | Source: Ambulatory Visit | Attending: Pulmonary Disease | Admitting: Pulmonary Disease

## 2018-08-06 ENCOUNTER — Telehealth: Payer: Self-pay | Admitting: Endocrinology

## 2018-08-06 VITALS — Wt 154.3 lb

## 2018-08-06 DIAGNOSIS — J432 Centrilobular emphysema: Secondary | ICD-10-CM | POA: Diagnosis not present

## 2018-08-06 NOTE — Telephone Encounter (Signed)
Paperwork was placed on SE's desk/thx dmf

## 2018-08-06 NOTE — Progress Notes (Signed)
Daily Session Note  Patient Details  Name: [name] MRN: [id number] Date of Birth: 1941-06-04 Referring Provider:     Pulmonary Rehab Walk Test from 07/02/2018 in [place]  Referring Provider  Dr. Nelda Marseille      Encounter Date: 08/06/2018  Check In: Session Check In - 08/06/18 1237      Check-In   Supervising physician immediately available to respond to emergencies  [facility] immediately available    Physician(s)  Dr. Sloan Leiter    Location  [facility]    Staff Present  Maurice Small, RN, BSN;Haylie Mccutcheon, MS, ACSM RCEP, Exercise Physiologist;Lisa Ysidro Evert, Felipe Drone, RN, MHA    Medication changes reported      No    Fall or balance concerns reported     No    Tobacco Cessation  No Change    Warm-up and Cool-down  Performed as group-led instruction    Resistance Training Performed  Yes    VAD Patient?  No    PAD/SET Patient?  No      Pain Assessment   Currently in Pain?  No/denies    Multiple Pain Sites  No       Capillary Blood Glucose: No results found for this or any previous visit (from the past 24 hour(s)). POCT Glucose - 08/06/18 1258      POCT Blood Glucose   Pre-Exercise  155 mg/dL    Post-Exercise  140 mg/dL      Exercise Prescription Changes - 08/06/18 1200      Response to Exercise   Blood Pressure (Admit)  110/50    Blood Pressure (Exercise)  160/60    Blood Pressure (Exit)  122/60    Heart Rate (Admit)  87 bpm    Heart Rate (Exercise)  97 bpm    Heart Rate (Exit)  80 bpm    Oxygen Saturation (Admit)  96 %    Oxygen Saturation (Exercise)  94 %    Oxygen Saturation (Exit)  96 %    Rating of Perceived Exertion (Exercise)  13    Perceived Dyspnea (Exercise)  2    Duration  Progress to 45 minutes of aerobic exercise without signs/symptoms of physical distress    Intensity  Other (comment)   40-80% od HRR     Progression   Progression  Continue to progress workloads to  maintain intensity without signs/symptoms of physical distress.      Resistance Training   Training Prescription  Yes    Weight  blue bands    Reps  10-15    Time  10 Minutes      Oxygen   Oxygen  Continuous    Liters  2      Recumbant Bike   Level  2    Minutes  17      NuStep   Level  2    SPM  80    Minutes  17    METs  1.8      Track   Laps  9    Minutes  17       Social History   Tobacco Use  Smoking Status Former Smoker  . Packs/day: 1.00  . Years: 8.00  . Pack years: 8.00  . Types: Cigarettes  . Last attempt to quit: 11/21/1983  . Years since quitting: 34.7  Smokeless Tobacco Never Used    Goals Met:  Exercise tolerated well  Goals Unmet:  Not Applicable  Comments: Service time is from 10:30a to 12:10p    Dr. Rush Farmer is Medical Director for Pulmonary Rehab at [facility].

## 2018-08-06 NOTE — Telephone Encounter (Signed)
laurens with [facility] is asking if we received the paperwork for genetic testing for this patient # [phone]

## 2018-08-07 NOTE — Progress Notes (Addendum)
Pulmonary Individual Treatment Plan  Patient Details  Name: [name] MRN: [id number] Date of Birth: 1941-05-26 Referring Provider:     Pulmonary Rehab Walk Test from 07/02/2018 in [facility]  Referring Provider  Dr. Nelda Marseille      Initial Encounter Date:    Pulmonary Rehab Walk Test from 07/02/2018 in [facility]  Date  07/02/18      Visit Diagnosis: Centrilobular emphysema ([place])  Patient's Home Medications on Admission:  Current Outpatient Medications:  .  amLODipine (NORVASC) 10 MG tablet, Take 10 mg by mouth every morning. , Disp: , Rfl:  .  aspirin EC 81 MG tablet, Take 81 mg by mouth daily., Disp: , Rfl:  .  azaTHIOprine (IMURAN) 50 MG tablet, Take 50 mg by mouth daily., Disp: , Rfl:  .  Azelastine HCl 0.15 % SOLN, Place 1 spray into the nose 2 (two) times daily., Disp: , Rfl:  .  calcitRIOL (ROCALTROL) 0.25 MCG capsule, Take 0.25 mcg by mouth daily. , Disp: , Rfl:  .  ferrous sulfate 325 (65 FE) MG tablet, Take 325 mg by mouth daily with breakfast., Disp: , Rfl:  .  fluticasone (FLONASE) 50 MCG/ACT nasal spray, Place 1 spray into both nostrils 2 (two) times daily., Disp: , Rfl:  .  fluticasone furoate-vilanterol (BREO ELLIPTA) 200-25 MCG/INH AEPB, Inhale 1 puff into the lungs daily., Disp: , Rfl:  .  glipiZIDE (GLUCOTROL) 5 MG tablet, Take 0.5 tablets (2.5 mg total) by mouth daily before breakfast., Disp: 30 tablet, Rfl: 1 .  nitroGLYCERIN (NITROSTAT) 0.4 MG SL tablet, Place 1 tablet (0.4 mg total) under the tongue every 5 (five) minutes as needed for chest pain., Disp: 25 tablet, Rfl: 6 .  omeprazole (PRILOSEC) 20 MG capsule, Take 20 mg by mouth daily. , Disp: , Rfl:  .  predniSONE (DELTASONE) 5 MG tablet, Take 1 tablet (5 mg total) by mouth daily., Disp: , Rfl:  .  rosuvastatin (CRESTOR) 10 MG tablet, Take 1 tablet (10 mg total) by mouth daily., Disp: 90 tablet, Rfl: 3 .  sennosides-docusate sodium (SENOKOT-S)  8.6-50 MG tablet, Take 1 tablet by mouth daily., Disp: , Rfl:  .  tacrolimus (PROGRAF) 1 MG capsule, Take 1 mg by mouth 2 (two) times daily. Pt takes 5 capsules (81m) in the morning at 9 am and 6 capsules(625m at 9 pm, Disp: , Rfl:  .  Tiotropium Bromide Monohydrate (SPIRIVA RESPIMAT) 1.25 MCG/ACT AERS, Inhale 1 puff into the lungs daily., Disp: , Rfl:  .  triamterene-hydrochlorothiazide (MAXZIDE-25) 37.5-25 MG tablet, Take 1 tablet by mouth daily. (Patient not taking: Reported on 07/01/2018), Disp: 30 tablet, Rfl: 0  Past Medical History: Past Medical History:  Diagnosis Date  . 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 Asthma   . Coronary artery disease    cath 11/2007, grafts patent /  Nuclear, June, 2011, prior inferior MI with mild peri-infarct ischemia, anterior breast attenuation, EF 67%, done for renal transplant assessment / Low level exercise/Lexiscan Myoview (11/2013): EF 67%, no ischemi; normal study  . Diabetes mellitus    type 2  . [facility]   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, 2012  . GERD (gastroesophageal reflux disease)   . GI bleed 11/26/2010   January, 2012 , AVM  . Gout   . History of methicillin resistant staphylococcus aureus (  MRSA)   . Hyperlipidemia   . Hyperparathyroidism   . Hypertension   . Myocardial infarction ([place])   . Osteoporosis   . Pneumonia 08/2010    Hospitalization, October, 2011  . Primary osteoarthritis, 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  . S/P kidney transplant    September, 2012, [facility]  . Subacromial impingement of 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    Tobacco Use: Social History   Tobacco Use  Smoking Status Former Smoker  . Packs/day: 1.00  . Years: 8.00  . Pack years: 8.00  . Types: Cigarettes  . Last attempt to quit: 11/21/1983  . Years since quitting: 34.7  Smokeless Tobacco Never Used    Labs: Recent Review Flowsheet Data    Labs for ITP Cardiac and Pulmonary  Rehab Latest Ref Rng & Units 09/18/2016 02/13/2018 02/14/2018 06/05/2018 06/12/2018   Cholestrol 100 - 199 mg/dL 165 - 182 171 -   LDLCALC 0 - 99 mg/dL 80 - 93 75 -   LDLDIRECT mg/dL - - - - -   HDL >39 mg/dL 63 - 69 71 -   Trlycerides 0 - 149 mg/dL 111 - 102 125 -   Hemoglobin A1c 4.0 - 5.6 % - 5.7(H) - - 5.8(A)   PHART 7.350 - 7.400 - - - - -   PCO2ART 35.0 - 45.0 mmHg - - - - -   HCO3 20.0 - 24.0 mEq/L - - - - -   TCO2 0 - 100 mmol/L - - - - -   ACIDBASEDEF 0.0 - 2.0 mmol/L - - - - -   O2SAT % - - - - -      Capillary Blood Glucose: Lab Results  Component Value Date   GLUCAP 153 (H) 02/21/2018   GLUCAP 170 (H) 02/21/2018   GLUCAP 123 (H) 02/21/2018   GLUCAP 119 (H) 02/21/2018   GLUCAP 138 (H) 02/20/2018   POCT Glucose    Row Name 07/09/18 1249 07/23/18 1236 08/06/18 1258         POCT Blood Glucose   Pre-Exercise  87 mg/dL Snack before exercise  128 mg/dL  155 mg/dL     Post-Exercise  144 mg/dL  140 mg/dL  140 mg/dL     Pre-Exercise #2  124 mg/dL  -  -        Exercise Target Goals: Exercise Program Goal: Individual exercise prescription set using results from initial 6 min walk test and THRR while considering  patient's activity barriers and safety.   Exercise Prescription Goal: Initial exercise prescription builds to 30-45 minutes a day of aerobic activity, 2-3 days per week.  Home exercise guidelines will be given to patient during program as part of exercise prescription that the participant will acknowledge.  Activity Barriers & Risk Stratification: Activity Barriers & Cardiac Risk Stratification - 07/01/18 1041      Activity Barriers & Cardiac Risk Stratification   Activity Barriers  Left Knee Replacement;Joint Problems;Left Hip Replacement;Shortness of Breath;Arthritis;Back Problems   shoulder surgery bilateral      6 Minute Walk: 6 Minute Walk    Row Name 07/04/18 0728         6 Minute Walk   Phase  Initial     Distance  1113 feet     Walk Time  6  minutes     # of Rest Breaks  0     MPH  2.1     METS  2.61     RPE  12     Perceived Dyspnea  1     Symptoms  Yes (comment)     Comments  used wheelchair/leg fatigue     Resting HR  88 bpm     Resting BP  144/80     Resting Oxygen Saturation   91 %     Exercise Oxygen Saturation  during 6 min walk  89 %     Max Ex. HR  112 bpm     Max Ex. BP  194/80     2 Minute Post BP  148/68       Interval HR   1 Minute HR  98     2 Minute HR  104     3 Minute HR  108     4 Minute HR  108     5 Minute HR  110     6 Minute HR  112     2 Minute Post HR  98     Interval Heart Rate?  Yes       Interval Oxygen   Interval Oxygen?  Yes     Baseline Oxygen Saturation %  91 %     1 Minute Oxygen Saturation %  93 %     1 Minute Liters of Oxygen  2 L     2 Minute Oxygen Saturation %  90 %     2 Minute Liters of Oxygen  2 L     3 Minute Oxygen Saturation %  89 %     3 Minute Liters of Oxygen  2 L     4 Minute Oxygen Saturation %  90 %     4 Minute Liters of Oxygen  2 L     5 Minute Oxygen Saturation %  91 %     5 Minute Liters of Oxygen  2 L     6 Minute Oxygen Saturation %  91 %     6 Minute Liters of Oxygen  2 L     2 Minute Post Oxygen Saturation %  95 %     2 Minute Post Liters of Oxygen  2 L        Oxygen Initial Assessment: Oxygen Initial Assessment - 07/04/18 0730      Initial 6 min Walk   Oxygen Used  Continuous    Liters per minute  2      Program Oxygen Prescription   Program Oxygen Prescription  Continuous;E-Tanks    Liters per minute  2       Oxygen Re-Evaluation: Oxygen Re-Evaluation    Row Name 07/08/18 1621 08/05/18 1423           Program Oxygen Prescription   Program Oxygen Prescription  Continuous;E-Tanks  Continuous;E-Tanks      Liters per minute  2  2        Home Oxygen   Home Oxygen Device  E-Tanks;Home Concentrator  E-Tanks;Home Concentrator      Sleep Oxygen Prescription  Continuous  Continuous      Liters per minute  2  2      Home Exercise  Oxygen Prescription  Continuous  Continuous      Liters per minute  2  2      Home at Rest Exercise Oxygen Prescription  Continuous  Continuous      Liters per minute  -  2      Compliance with Home Oxygen Use  Yes  Yes        Goals/Expected Outcomes  Short Term Goals  To learn and exhibit compliance with exercise, home and travel O2 prescription;To learn and understand importance of monitoring SPO2 with pulse oximeter and demonstrate accurate use of the pulse oximeter.;To learn and understand importance of maintaining oxygen saturations>88%;To learn and demonstrate proper pursed lip breathing techniques or other breathing techniques.;To learn and demonstrate proper use of respiratory medications  To learn and exhibit compliance with exercise, home and travel O2 prescription;To learn and understand importance of monitoring SPO2 with pulse oximeter and demonstrate accurate use of the pulse oximeter.;To learn and understand importance of maintaining oxygen saturations>88%;To learn and demonstrate proper pursed lip breathing techniques or other breathing techniques.;To learn and demonstrate proper use of respiratory medications      Long  Term Goals  Exhibits compliance with exercise, home and travel O2 prescription;Verbalizes importance of monitoring SPO2 with pulse oximeter and return demonstration;Maintenance of O2 saturations>88%;Exhibits proper breathing techniques, such as pursed lip breathing or other method taught during program session;Compliance with respiratory medication;Demonstrates proper use of MDI's  Exhibits compliance with exercise, home and travel O2 prescription;Verbalizes importance of monitoring SPO2 with pulse oximeter and return demonstration;Maintenance of O2 saturations>88%;Exhibits proper breathing techniques, such as pursed lip breathing or other method taught during program session;Compliance with respiratory medication;Demonstrates proper use of MDI's      Goals/Expected  Outcomes  compliance  compliance         Oxygen Discharge (Final Oxygen Re-Evaluation): Oxygen Re-Evaluation - 08/05/18 1423      Program Oxygen Prescription   Program Oxygen Prescription  Continuous;E-Tanks    Liters per minute  2      Home Oxygen   Home Oxygen Device  E-Tanks;Home Concentrator    Sleep Oxygen Prescription  Continuous    Liters per minute  2    Home Exercise Oxygen Prescription  Continuous    Liters per minute  2    Home at Rest Exercise Oxygen Prescription  Continuous    Liters per minute  2    Compliance with Home Oxygen Use  Yes      Goals/Expected Outcomes   Short Term Goals  To learn and exhibit compliance with exercise, home and travel O2 prescription;To learn and understand importance of monitoring SPO2 with pulse oximeter and demonstrate accurate use of the pulse oximeter.;To learn and understand importance of maintaining oxygen saturations>88%;To learn and demonstrate proper pursed lip breathing techniques or other breathing techniques.;To learn and demonstrate proper use of respiratory medications    Long  Term Goals  Exhibits compliance with exercise, home and travel O2 prescription;Verbalizes importance of monitoring SPO2 with pulse oximeter and return demonstration;Maintenance of O2 saturations>88%;Exhibits proper breathing techniques, such as pursed lip breathing or other method taught during program session;Compliance with respiratory medication;Demonstrates proper use of MDI's    Goals/Expected Outcomes  compliance       Initial Exercise Prescription: Initial Exercise Prescription - 07/04/18 0700      Date of Initial Exercise RX and Referring Provider   Date  07/02/18    Referring Provider  Dr. Nelda Marseille      Oxygen   Oxygen  Continuous    Liters  2      Recumbant Bike   Level  2    Watts  10    Minutes  17      NuStep   Level  2    SPM  80    Minutes  17    METs  1.5      Track   Laps  7    Minutes  17      Prescription Details    Frequency (times per week)  2    Duration  Progress to 45 minutes of aerobic exercise without signs/symptoms of physical distress      Intensity   THRR 40-80% of Max Heartrate  58-116    Ratings of Perceived Exertion  11-13    Perceived Dyspnea  0-4      Progression   Progression  Continue progressive overload as per policy without signs/symptoms or physical distress.      Resistance Training   Training Prescription  Yes    Weight  BLUE BANDS    Reps  10-15       Perform Capillary Blood Glucose checks as needed.  Exercise Prescription Changes: Exercise Prescription Changes    Row Name 07/09/18 1200 07/23/18 1200 08/06/18 1200         Response to Exercise   Blood Pressure (Admit)  148/80  148/70  110/50     Blood Pressure (Exercise)  160/64  168/68  160/60     Blood Pressure (Exit)  154/80  122/60  122/60     Heart Rate (Admit)  88 bpm  88 bpm  87 bpm     Heart Rate (Exercise)  123 bpm  103 bpm  97 bpm     Heart Rate (Exit)  97 bpm  90 bpm  80 bpm     Oxygen Saturation (Admit)  96 %  99 %  96 %     Oxygen Saturation (Exercise)  91 %  92 %  94 %     Oxygen Saturation (Exit)  97 %  98 %  96 %     Rating of Perceived Exertion (Exercise)  _0 Perceived Dyspnea (Exercise)  _1 Duration  Progress to 45 minutes of aerobic exercise without signs/symptoms of physical distress  Progress to 45 minutes of aerobic exercise without signs/symptoms of physical distress  Progress to 45 minutes of aerobic exercise without signs/symptoms of physical distress     Intensity  Other (comment) 40-80% od HRR  Other (comment) 40-80% od HRR  Other (comment) 40-80% od HRR       Progression   Progression  Continue to progress workloads to maintain intensity without signs/symptoms of physical distress.  Continue to progress workloads to maintain intensity without signs/symptoms of physical distress.  Continue to progress workloads to maintain intensity without signs/symptoms of  physical distress.       Resistance Training   Training Prescription  Yes  Yes  Yes     Weight  blue bands  blue bands  blue bands     Reps  10-15  10-15  10-15     Time  10 Minutes  10 Minutes  10 Minutes       Oxygen   Oxygen  Continuous  Continuous  Continuous     Liters  _2 Recumbant Bike   Level  _3 Minutes  _4 NuStep   Level  -  2  2     SPM  -  80  80     Minutes  -  17  17     METs  -  1.8  1.8  Track   Laps  -  9  9     Minutes  _0 Exercise Comments:   Exercise Goals and Review: Exercise Goals    Row Name 07/01/18 1042             Exercise Goals   Increase Physical Activity  Yes       Intervention  Provide advice, education, support and counseling about physical activity/exercise needs.;Develop an individualized exercise prescription for aerobic and resistive training based on initial evaluation findings, risk stratification, comorbidities and participant's personal goals.       Expected Outcomes  Short Term: Attend rehab on a regular basis to increase amount of physical activity.;Long Term: Add in home exercise to make exercise part of routine and to increase amount of physical activity.;Long Term: Exercising regularly at least 3-5 days a week.       Increase Strength and Stamina  Yes       Intervention  Provide advice, education, support and counseling about physical activity/exercise needs.;Develop an individualized exercise prescription for aerobic and resistive training based on initial evaluation findings, risk stratification, comorbidities and participant's personal goals.       Expected Outcomes  Short Term: Increase workloads from initial exercise prescription for resistance, speed, and METs.;Short Term: Perform resistance training exercises routinely during rehab and add in resistance training at home;Long Term: Improve cardiorespiratory fitness, muscular endurance and strength as measured by  increased METs and functional capacity (6MWT)       Able to understand and use rate of perceived exertion (RPE) scale  Yes       Intervention  Provide education and explanation on how to use RPE scale       Expected Outcomes  Short Term: Able to use RPE daily in rehab to express subjective intensity level;Long Term:  Able to use RPE to guide intensity level when exercising independently       Able to understand and use Dyspnea scale  Yes       Intervention  Provide education and explanation on how to use Dyspnea scale       Expected Outcomes  Short Term: Able to use Dyspnea scale daily in rehab to express subjective sense of shortness of breath during exertion;Long Term: Able to use Dyspnea scale to guide intensity level when exercising independently       Knowledge and understanding of Target Heart Rate Range (THRR)  Yes       Intervention  Provide education and explanation of THRR including how the numbers were predicted and where they are located for reference       Expected Outcomes  Short Term: Able to state/look up THRR;Short Term: Able to use daily as guideline for intensity in rehab;Long Term: Able to use THRR to govern intensity when exercising independently       Understanding of Exercise Prescription  Yes       Intervention  Provide education, explanation, and written materials on patient's individual exercise prescription       Expected Outcomes  Short Term: Able to explain program exercise prescription;Long Term: Able to explain home exercise prescription to exercise independently          Exercise Goals Re-Evaluation : Exercise Goals Re-Evaluation    Row Name 07/08/18 1621 08/05/18 1424           Exercise Goal Re-Evaluation   Exercise Goals Review  Increase Physical Activity;Increase Strength and Stamina;Able to  understand and use rate of perceived exertion (RPE) scale;Able to understand and use Dyspnea scale;Knowledge and understanding of Target Heart Rate Range  (THRR);Understanding of Exercise Prescription  Increase Physical Activity;Increase Strength and Stamina;Able to understand and use rate of perceived exertion (RPE) scale;Able to understand and use Dyspnea scale;Knowledge and understanding of Target Heart Rate Range (THRR);Understanding of Exercise Prescription      Comments  Patient will start first day of rehab on 07/09/18  Patient is progressing well in rehab. She is able to walk up to 9 laps (200 ft each) in 15 minutes. Will encourage home exercise. Will monitor and motivate.      Expected Outcomes  -  Through exercise at home and rehab, patient will build endurance and be able to perform ADL's with less shortness of breath, Patient will also feel comfrotable estblaishing an exercise routine at home.         Discharge Exercise Prescription (Final Exercise Prescription Changes): Exercise Prescription Changes - 08/06/18 1200      Response to Exercise   Blood Pressure (Admit)  110/50    Blood Pressure (Exercise)  160/60    Blood Pressure (Exit)  122/60    Heart Rate (Admit)  87 bpm    Heart Rate (Exercise)  97 bpm    Heart Rate (Exit)  80 bpm    Oxygen Saturation (Admit)  96 %    Oxygen Saturation (Exercise)  94 %    Oxygen Saturation (Exit)  96 %    Rating of Perceived Exertion (Exercise)  13    Perceived Dyspnea (Exercise)  2    Duration  Progress to 45 minutes of aerobic exercise without signs/symptoms of physical distress    Intensity  Other (comment)   40-80% od HRR     Progression   Progression  Continue to progress workloads to maintain intensity without signs/symptoms of physical distress.      Resistance Training   Training Prescription  Yes    Weight  blue bands    Reps  10-15    Time  10 Minutes      Oxygen   Oxygen  Continuous    Liters  2      Recumbant Bike   Level  2    Minutes  17      NuStep   Level  2    SPM  80    Minutes  17    METs  1.8      Track   Laps  9    Minutes  17       Nutrition:   Target Goals: Understanding of nutrition guidelines, daily intake of sodium <1538m, cholesterol <2051m calories 30% from fat and 7% or less from saturated fats, daily to have 5 or more servings of fruits and vegetables.  Biometrics:    Nutrition Therapy Plan and Nutrition Goals: Nutrition Therapy & Goals - 07/26/18 1150      Nutrition Therapy   Diet  consistent carbohydrate heart healthy      Personal Nutrition Goals   Nutrition Goal  The pt will recognize symptoms that can interfere with adequate oral intake    Personal Goal #2  Identify food quantities necessary to achieve wt loss of  -2# per week to a goal wt loss of 2.7-10.9 kg (6-15 lb) at graduation from pulmonary rehab.    Personal Goal #3  Pt to identify and limit food sources of sodium,saturated fat, trans fat, refined carbohydrates  Intervention Plan   Intervention  Prescribe, educate and counsel regarding individualized specific dietary modifications aiming towards targeted core components such as weight, hypertension, lipid management, diabetes, heart failure and other comorbidities.    Expected Outcomes  Short Term Goal: Understand basic principles of dietary content, such as calories, fat, sodium, cholesterol and nutrients.       Nutrition Assessments: Nutrition Assessments - 07/02/18 1351      Rate Your Plate Scores   Pre Score  46       Nutrition Goals Re-Evaluation:   Nutrition Goals Re-Evaluation:   Nutrition Goals Discharge (Final Nutrition Goals Re-Evaluation):   Psychosocial: Target Goals: Acknowledge presence or absence of significant depression and/or stress, maximize coping skills, provide positive support system. Participant is able to verbalize types and ability to use techniques and skills needed for reducing stress and depression.  Initial Review & Psychosocial Screening: Initial Psych Review & Screening - 07/01/18 1045      Initial Review   Current issues with  None Identified       Family Dynamics   Good Support System?  No    Strains  Intra-family strains    Comments  has four children who live in [place] that do not help or concerned about her health.  Yolanda Bonine helps with errands.       Barriers   Psychosocial barriers to participate in program  The patient should benefit from training in stress management and relaxation.      Screening Interventions   Interventions  Encouraged to exercise    Expected Outcomes  Short Term goal: Identification and review with participant of any Quality of Life or Depression concerns found by scoring the questionnaire.;Long Term goal: The participant improves quality of Life and PHQ9 Scores as seen by post scores and/or verbalization of changes       Quality of Life Scores:  Scores of 19 and below usually indicate a poorer quality of life in these areas.  A difference of  2-3 points is a clinically meaningful difference.  A difference of 2-3 points in the total score of the Quality of Life Index has been associated with significant improvement in overall quality of life, self-image, physical symptoms, and general health in studies assessing change in quality of life.  PHQ-9: Recent Review Flowsheet Data    Depression screen [facility] 2/9 07/01/2018   Decreased Interest 0   Down, Depressed, Hopeless 0   PHQ - 2 Score 0   Altered sleeping 0   Tired, decreased energy 2   Change in appetite 0   Feeling bad or failure about yourself  0   Trouble concentrating 0   Moving slowly or fidgety/restless 0   PHQ-9 Score 2   Difficult doing work/chores Not difficult at all     Interpretation of Total Score  Total Score Depression Severity:  1-4 = Minimal depression, 5-9 = Mild depression, 10-14 = Moderate depression, 15-19 = Moderately severe depression, 20-27 = Severe depression   Psychosocial Evaluation and Intervention: Psychosocial Evaluation - 08/07/18 1453      Psychosocial Evaluation & Interventions   Interventions  Stress  management education;Relaxation education;Encouraged to exercise with the program and follow exercise prescription    Comments  althoug pt has intra family strains, pt has resolved this wihin herself. Pt interacts well with staff and likes to particpate in exercise at Pulmonary Rehab    Expected Outcomes  Pt will continue to display positive and healthy coping skills and have peace with the  relationship she has with her children    Continue Psychosocial Services   Follow up required by staff       Psychosocial Re-Evaluation: Psychosocial Re-Evaluation    [place] Name 07/01/18 1052 07/09/18 1732 08/07/18 1453         Psychosocial Re-Evaluation   Current issues with  None Identified  None Identified  None Identified     Comments  no identifiable barriers  no identifiable barriers  no identifiable barriers     Expected Outcomes  -  Pt will remain at peace in regards to her relationship with her children.  Pt will have a hopefull outlook on her future.  Pt will remain at peace in regards to her relationship with her children.  Pt will have a hopefull outlook on her future.     Interventions  Relaxation education;Encouraged to attend Pulmonary Rehabilitation for the exercise;Stress management education  -  Relaxation education;Encouraged to attend Pulmonary Rehabilitation for the exercise;Stress management education     Continue Psychosocial Services   -  Follow up required by staff  Follow up required by staff        Psychosocial Discharge (Final Psychosocial Re-Evaluation): Psychosocial Re-Evaluation - 08/07/18 1453      Psychosocial Re-Evaluation   Current issues with  None Identified    Comments  no identifiable barriers    Expected Outcomes  Pt will remain at peace in regards to her relationship with her children.  Pt will have a hopefull outlook on her future.    Interventions  Relaxation education;Encouraged to attend Pulmonary Rehabilitation for the exercise;Stress management education     Continue Psychosocial Services   Follow up required by staff       Vocational Rehabilitation: Provide vocational rehab assistance to qualifying candidates.   Vocational Rehab Evaluation & Intervention: Vocational Rehab - 07/01/18 1053      Initial Vocational Rehab Evaluation & Intervention   Assessment shows need for Vocational Rehabilitation  No       Education: Education Goals: Education classes will be provided on a weekly basis, covering required topics. Participant will state understanding/return demonstration of topics presented.  Learning Barriers/Preferences: Learning Barriers/Preferences - 07/01/18 1053      Learning Barriers/Preferences   Learning Barriers  Sight    Learning Preferences  Group Instruction;Individual Instruction;Verbal Instruction;Written Material       Education Topics: Count Your Pulse:  -Group instruction provided by verbal instruction, demonstration, patient participation and written materials to support subject.  Instructors address importance of being able to find your pulse and how to count your pulse when at home without a heart monitor.  Patients get hands on experience counting their pulse with staff help and individually.   Heart Attack, Angina, and Risk Factor Modification:  -Group instruction provided by verbal instruction, video, and written materials to support subject.  Instructors address signs and symptoms of angina and heart attacks.    Also discuss risk factors for heart disease and how to make changes to improve heart health risk factors.   Functional Fitness:  -Group instruction provided by verbal instruction, demonstration, patient participation, and written materials to support subject.  Instructors address safety measures for doing things around the house.  Discuss how to get up and down off the floor, how to pick things up properly, how to safely get out of a chair without assistance, and balance training.   Meditation and  Mindfulness:  -Group instruction provided by verbal instruction, patient participation, and written materials to support subject.  Instructor addresses importance of mindfulness and meditation practice to help reduce stress and improve awareness.  Instructor also leads participants through a meditation exercise.    Stretching for Flexibility and Mobility:  -Group instruction provided by verbal instruction, patient participation, and written materials to support subject.  Instructors lead participants through series of stretches that are designed to increase flexibility thus improving mobility.  These stretches are additional exercise for major muscle groups that are typically performed during regular warm up and cool down.   Hands Only CPR:  -Group verbal, video, and participation provides a basic overview of AHA guidelines for community CPR. Role-play of emergencies allow participants the opportunity to practice calling for help and chest compression technique with discussion of AED use.   Hypertension: -Group verbal and written instruction that provides a basic overview of hypertension including the most recent diagnostic guidelines, risk factor reduction with self-care instructions and medication management.    Nutrition I class: Heart Healthy Eating:  -Group instruction provided by PowerPoint slides, verbal discussion, and written materials to support subject matter. The instructor gives an explanation and review of the Therapeutic Lifestyle Changes diet recommendations, which includes a discussion on lipid goals, dietary fat, sodium, fiber, plant stanol/sterol esters, sugar, and the components of a well-balanced, healthy diet.   Nutrition II class: Lifestyle Skills:  -Group instruction provided by PowerPoint slides, verbal discussion, and written materials to support subject matter. The instructor gives an explanation and review of label reading, grocery shopping for heart health, heart  healthy recipe modifications, and ways to make healthier choices when eating out.   Diabetes Question & Answer:  -Group instruction provided by PowerPoint slides, verbal discussion, and written materials to support subject matter. The instructor gives an explanation and review of diabetes co-morbidities, pre- and post-prandial blood glucose goals, pre-exercise blood glucose goals, signs, symptoms, and treatment of hypoglycemia and hyperglycemia, and foot care basics.   Diabetes Blitz:  -Group instruction provided by PowerPoint slides, verbal discussion, and written materials to support subject matter. The instructor gives an explanation and review of the physiology behind type 1 and type 2 diabetes, diabetes medications and rational behind using different medications, pre- and post-prandial blood glucose recommendations and Hemoglobin A1c goals, diabetes diet, and exercise including blood glucose guidelines for exercising safely.    Portion Distortion:  -Group instruction provided by PowerPoint slides, verbal discussion, written materials, and food models to support subject matter. The instructor gives an explanation of serving size versus portion size, changes in portions sizes over the last 20 years, and what consists of a serving from each food group.   Stress Management:  -Group instruction provided by verbal instruction, video, and written materials to support subject matter.  Instructors review role of stress in heart disease and how to cope with stress positively.     Exercising on Your Own:  -Group instruction provided by verbal instruction, power point, and written materials to support subject.  Instructors discuss benefits of exercise, components of exercise, frequency and intensity of exercise, and end points for exercise.  Also discuss use of nitroglycerin and activating EMS.  Review options of places to exercise outside of rehab.  Review guidelines for sex with heart  disease.   Cardiac Drugs I:  -Group instruction provided by verbal instruction and written materials to support subject.  Instructor reviews cardiac drug classes: antiplatelets, anticoagulants, beta blockers, and statins.  Instructor discusses reasons, side effects, and lifestyle considerations for each drug class.   Cardiac Drugs II:  -Group instruction  provided by verbal instruction and written materials to support subject.  Instructor reviews cardiac drug classes: angiotensin converting enzyme inhibitors (ACE-I), angiotensin II receptor blockers (ARBs), nitrates, and calcium channel blockers.  Instructor discusses reasons, side effects, and lifestyle considerations for each drug class.   Anatomy and Physiology of the Circulatory System:  Group verbal and written instruction and models provide basic cardiac anatomy and physiology, with the coronary electrical and arterial systems. Review of: AMI, Angina, Valve disease, Heart Failure, Peripheral Artery Disease, Cardiac Arrhythmia, Pacemakers, and the ICD.   Other Education:  -Group or individual verbal, written, or video instructions that support the educational goals of the cardiac rehab program.   Holiday Eating Survival Tips:  -Group instruction provided by PowerPoint slides, verbal discussion, and written materials to support subject matter. The instructor gives patients tips, tricks, and techniques to help them not only survive but enjoy the holidays despite the onslaught of food that accompanies the holidays.   Knowledge Questionnaire Score: Knowledge Questionnaire Score - 07/02/18 1656      Knowledge Questionnaire Score   Pre Score  17/18       Core Components/Risk Factors/Patient Goals at Admission: Personal Goals and Risk Factors at Admission - 07/01/18 1053      Core Components/Risk Factors/Patient Goals on Admission    Weight Management  Yes;Weight Maintenance    Intervention  Weight Management: Develop a combined  nutrition and exercise program designed to reach desired caloric intake, while maintaining appropriate intake of nutrient and fiber, sodium and fats, and appropriate energy expenditure required for the weight goal.;Weight Management: Provide education and appropriate resources to help participant work on and attain dietary goals.;Weight Management/Obesity: Establish reasonable short term and long term weight goals.    Admit Weight  156 lb 4.9 oz (70.9 kg)    Expected Outcomes  Understanding of distribution of calorie intake throughout the day with the consumption of 4-5 meals/snacks;Understanding recommendations for meals to include 15-35% energy as protein, 25-35% energy from fat, 35-60% energy from carbohydrates, less than 273m of dietary cholesterol, 20-35 gm of total fiber daily;Weight Maintenance: Understanding of the daily nutrition guidelines, which includes 25-35% calories from fat, 7% or less cal from saturated fats, less than 2046mcholesterol, less than 1.5gm of sodium, & 5 or more servings of fruits and vegetables daily;Short Term: Continue to assess and modify interventions until short term weight is achieved;Long Term: Adherence to nutrition and physical activity/exercise program aimed toward attainment of established weight goal    Improve shortness of breath with ADL's  Yes    Intervention  Provide education, individualized exercise plan and daily activity instruction to help decrease symptoms of SOB with activities of daily living.    Expected Outcomes  Short Term: Improve cardiorespiratory fitness to achieve a reduction of symptoms when performing ADLs;Long Term: Be able to perform more ADLs without symptoms or delay the onset of symptoms    Diabetes  Yes    Intervention  Provide education about signs/symptoms and action to take for hypo/hyperglycemia.;Provide education about proper nutrition, including hydration, and aerobic/resistive exercise prescription along with prescribed medications  to achieve blood glucose in normal ranges: Fasting glucose 65-99 mg/dL    Expected Outcomes  Short Term: Participant verbalizes understanding of the signs/symptoms and immediate care of hyper/hypoglycemia, proper foot care and importance of medication, aerobic/resistive exercise and nutrition plan for blood glucose control.;Long Term: Attainment of HbA1C < 7%.    Hypertension  Yes    Intervention  Provide education on lifestyle modifcations including  regular physical activity/exercise, weight management, moderate sodium restriction and increased consumption of fresh fruit, vegetables, and low fat dairy, alcohol moderation, and smoking cessation.;Monitor prescription use compliance.    Expected Outcomes  Short Term: Continued assessment and intervention until BP is < 140/43m HG in hypertensive participants. < 130/836mHG in hypertensive participants with diabetes, heart failure or chronic kidney disease.;Long Term: Maintenance of blood pressure at goal levels.       Core Components/Risk Factors/Patient Goals Review:  Goals and Risk Factor Review    Row Name 07/09/18 1734 07/10/18 0951 08/07/18 1454         Core Components/Risk Factors/Patient Goals Review   Personal Goals Review  Weight Management/Obesity;Improve shortness of breath with ADL's;Increase knowledge of respiratory medications and ability to use respiratory devices properly.;Heart Failure;Hypertension;Diabetes;Develop more efficient breathing techniques such as purse lipped breathing and diaphragmatic breathing and practicing self-pacing with activity.  -  Weight Management/Obesity;Improve shortness of breath with ADL's;Increase knowledge of respiratory medications and ability to use respiratory devices properly.;Heart Failure;Hypertension;Diabetes;Develop more efficient breathing techniques such as purse lipped breathing and diaphragmatic breathing and practicing self-pacing with activity.     Review  Pt started pulmonary rehab today -  8/20.  Unable to adequately assess pt progress toward pulmonary rehab goals.  Pt started pulmonary rehab today - 8/20.  Unable to adequately assess pt progress toward pulmonary rehab goals. Anticipate pt will show measurable progress during the next 30 day assessment.  Pt has completed 9 exercise sessions.  Pt weight remains unchanged from inital session.  Pt attended respiratory medication education class.  Pt developing technique for PLB techniques. Bp remain slightly elevated 140's  Pt rates dypnea level 1-2.  Pt reports CBG within normal limits this is an improvement from earlier sessions.  Pt understands the caloric needs for exercise.  Pt workloads remain flat with level 2 for recumbent bike and nustep.  pt averages 9 laps on the track.  Continue to monitor pt progress during teh next 30 day assessment     Expected Outcomes  See Admission Goals  See Admission Goals/Outocmes  See Admission Goals/Outocmes        Core Components/Risk Factors/Patient Goals at Discharge (Final Review):  Goals and Risk Factor Review - 08/07/18 1454      Core Components/Risk Factors/Patient Goals Review   Personal Goals Review  Weight Management/Obesity;Improve shortness of breath with ADL's;Increase knowledge of respiratory medications and ability to use respiratory devices properly.;Heart Failure;Hypertension;Diabetes;Develop more efficient breathing techniques such as purse lipped breathing and diaphragmatic breathing and practicing self-pacing with activity.    Review  Pt has completed 9 exercise sessions.  Pt weight remains unchanged from inital session.  Pt attended respiratory medication education class.  Pt developing technique for PLB techniques. Bp remain slightly elevated 140's  Pt rates dypnea level 1-2.  Pt reports CBG within normal limits this is an improvement from earlier sessions.  Pt understands the caloric needs for exercise.  Pt workloads remain flat with level 2 for recumbent bike and nustep.  pt  averages 9 laps on the track.  Continue to monitor pt progress during teh next 30 day assessment    Expected Outcomes  See Admission Goals/Outocmes       ITP Comments: ITP Comments    Row Name 07/01/18 1025 07/10/18 0951 08/07/18 1452       ITP Comments  Dr. WeJennet Maduromedicare Director  Dr. WeJennet Maduromedicare Director  Dr. WeJennet Maduromedical Director Pulmonary Rehab  Comments: Pt completed 9 exercise sessions.  Continue to monitor. Cherre Huger, BSN Cardiac and Training and development officer

## 2018-08-08 ENCOUNTER — Encounter (HOSPITAL_COMMUNITY)
Admission: RE | Admit: 2018-08-08 | Discharge: 2018-08-08 | Disposition: A | Discharge status: Home / Self Care | Payer: Medicare Other | Source: Ambulatory Visit | Attending: Pulmonary Disease | Admitting: Pulmonary Disease

## 2018-08-08 ENCOUNTER — Telehealth: Payer: Self-pay | Admitting: Endocrinology

## 2018-08-08 DIAGNOSIS — Z801 Family history of malignant neoplasm of trachea, bronchus and lung: Secondary | ICD-10-CM | POA: Insufficient documentation

## 2018-08-08 DIAGNOSIS — J432 Centrilobular emphysema: Secondary | ICD-10-CM

## 2018-08-08 NOTE — Telephone Encounter (Signed)
Pt stated yes to make a referral

## 2018-08-08 NOTE — Progress Notes (Signed)
Daily Session Note  Patient Details  Name: Electa A Routon MRN: [id number] Date of Birth: 02/24/1941 Referring Provider:     Pulmonary Rehab Walk Test from 07/02/2018 in [facility]  Referring Provider  Dr. Yacoub      Encounter Date: 08/08/2018  Check In: Session Check In - 08/08/18 1234      Check-In   Supervising physician immediately available to respond to emergencies  [facility] immediately available    Physician(s)  Dr. Shahmhedi    Location  [facility]    Staff Present  Carlette Carlton, RN, BSN;Molly DiVincenzo, MS, ACSM RCEP, Exercise Physiologist; , RN;Annedrea Stackhouse, RN, MHA    Medication changes reported      No    Fall or balance concerns reported     No    Tobacco Cessation  No Change    Warm-up and Cool-down  Performed as group-led instruction    Resistance Training Performed  Yes    VAD Patient?  No    PAD/SET Patient?  No      Pain Assessment   Currently in Pain?  No/denies    Multiple Pain Sites  No       Capillary Blood Glucose: No results found for this or any previous visit (from the past 24 hour(s)).    Social History   Tobacco Use  Smoking Status Former Smoker  . Packs/day: 1.00  . Years: 8.00  . Pack years: 8.00  . Types: Cigarettes  . Last attempt to quit: 11/21/1983  . Years since quitting: 34.7  Smokeless Tobacco Never Used    Goals Met:  Exercise tolerated well No report of cardiac concerns or symptoms Strength training completed today  Goals Unmet:  Not Applicable  Comments: Service time is from 1030 to 1220. Attended the Sedentary Lifestyle class.    Dr. Wesam G. Yacoub is Medical Director for Pulmonary Rehab at [facility]. 

## 2018-08-08 NOTE — Telephone Encounter (Signed)
please call patient: I received form for genetic testing.  This is best addressed by a specialist.  I would be happy to refer you.  Please let me know

## 2018-08-08 NOTE — Progress Notes (Signed)
While exercising at Pulmonary Rehab, [name] consistently uses 2 liters of continuous flow oxygen. This is provided by [facility]. At home, the patient currently uses continuous flow for their home oxygen system. While exercising at home, the patient was instructed to use 2 liters. In Pulmonary Rehab today, [name] walked the track utilizing their own home oxygen system. They used 2 liters  which maintained their oxygen saturation above 98%.

## 2018-08-08 NOTE — Telephone Encounter (Signed)
Ok, I did referral 

## 2018-08-09 ENCOUNTER — Telehealth (HOSPITAL_COMMUNITY): Payer: Self-pay

## 2018-08-09 NOTE — Progress Notes (Signed)
I have reviewed a Home Exercise Prescription with [name] . Marites is not currently exercising at home.  The patient was advised to walk 2 days a week for 30 minutes.  Daneka and I discussed how to progress their exercise prescription.  The patient stated that their goals were to get off of  Oxygen, decrease shortness of breath, ease with ADL's.  The patient stated that they understand the exercise prescription.  We reviewed exercise guidelines, target heart rate during exercise, RPE Scale, weather conditions, NTG use, endpoints for exercise, warmup and cool down.  Patient is encouraged to come to me with any questions. I will continue to follow up with the patient to assist them with progression and safety.

## 2018-08-09 NOTE — Telephone Encounter (Signed)
Called Dr. Rodena Piety at [facility] requesting they place an order for a POC for [name].

## 2018-08-12 DIAGNOSIS — K31811 Angiodysplasia of stomach and duodenum with bleeding: Secondary | ICD-10-CM | POA: Diagnosis not present

## 2018-08-12 DIAGNOSIS — M109 Gout, unspecified: Secondary | ICD-10-CM | POA: Diagnosis not present

## 2018-08-12 DIAGNOSIS — Z940 Kidney transplant status: Secondary | ICD-10-CM | POA: Diagnosis not present

## 2018-08-12 DIAGNOSIS — I129 Hypertensive chronic kidney disease with stage 1 through stage 4 chronic kidney disease, or unspecified chronic kidney disease: Secondary | ICD-10-CM | POA: Diagnosis not present

## 2018-08-12 DIAGNOSIS — K210 Gastro-esophageal reflux disease with esophagitis: Secondary | ICD-10-CM | POA: Diagnosis not present

## 2018-08-12 DIAGNOSIS — N2581 Secondary hyperparathyroidism of renal origin: Secondary | ICD-10-CM | POA: Diagnosis not present

## 2018-08-12 DIAGNOSIS — N183 Chronic kidney disease, stage 3 (moderate): Secondary | ICD-10-CM | POA: Diagnosis not present

## 2018-08-12 DIAGNOSIS — E1122 Type 2 diabetes mellitus with diabetic chronic kidney disease: Secondary | ICD-10-CM | POA: Diagnosis not present

## 2018-08-12 DIAGNOSIS — D631 Anemia in chronic kidney disease: Secondary | ICD-10-CM | POA: Diagnosis not present

## 2018-08-12 DIAGNOSIS — I25810 Atherosclerosis of coronary artery bypass graft(s) without angina pectoris: Secondary | ICD-10-CM | POA: Diagnosis not present

## 2018-08-12 DIAGNOSIS — J4521 Mild intermittent asthma with (acute) exacerbation: Secondary | ICD-10-CM | POA: Diagnosis not present

## 2018-08-13 ENCOUNTER — Encounter (HOSPITAL_COMMUNITY)
Admission: RE | Admit: 2018-08-13 | Discharge: 2018-08-13 | Disposition: A | Discharge status: Home / Self Care | Payer: Medicare Other | Source: Ambulatory Visit | Attending: Pulmonary Disease | Admitting: Pulmonary Disease

## 2018-08-13 DIAGNOSIS — J432 Centrilobular emphysema: Secondary | ICD-10-CM

## 2018-08-13 NOTE — Progress Notes (Signed)
Daily Session Note  Patient Details  Name: [name] MRN: [id number] Date of Birth: August 23, 1941 Referring Provider:     Pulmonary Rehab Walk Test from 07/02/2018 in [place]  Referring Provider  Dr. Nelda Marseille      Encounter Date: 08/13/2018  Check In: Session Check In - 08/13/18 1038      Check-In   Supervising physician immediately available to respond to emergencies  [facility] immediately available    Physician(s)  Dr. Loleta Books    Location  [facility]    Staff Present  Maurice Small, RN, BSN;Thor Nannini, MS, ACSM RCEP, Exercise Physiologist;Annedrea Stackhouse, RN, [place]    Medication changes reported      No    Fall or balance concerns reported     No    Tobacco Cessation  No Change    Warm-up and Cool-down  Performed as group-led instruction    Resistance Training Performed  Yes    VAD Patient?  No    PAD/SET Patient?  No      Pain Assessment   Currently in Pain?  No/denies    Multiple Pain Sites  No       Capillary Blood Glucose: No results found for this or any previous visit (from the past 24 hour(s)).    Social History   Tobacco Use  Smoking Status Former Smoker  . Packs/day: 1.00  . Years: 8.00  . Pack years: 8.00  . Types: Cigarettes  . Last attempt to quit: 11/21/1983  . Years since quitting: 34.7  Smokeless Tobacco Never Used    Goals Met:  Exercise tolerated well  Goals Unmet:  Not Applicable  Comments: Service time is from 10:30a to 12:30p    Dr. Rush Farmer is Medical Director for Pulmonary Rehab at [facility].

## 2018-08-14 NOTE — Telephone Encounter (Signed)
[employer]  Sent a form for the approval signature of Dr. [name] to [place] on 9/18 did we receive and what is the status of the form?  [phone]

## 2018-08-15 ENCOUNTER — Encounter (HOSPITAL_COMMUNITY)
Admission: RE | Admit: 2018-08-15 | Discharge: 2018-08-15 | Disposition: A | Discharge status: Home / Self Care | Payer: Medicare Other | Source: Ambulatory Visit | Attending: Pulmonary Disease | Admitting: Pulmonary Disease

## 2018-08-15 ENCOUNTER — Telehealth: Payer: Self-pay | Admitting: Endocrinology

## 2018-08-15 DIAGNOSIS — J432 Centrilobular emphysema: Secondary | ICD-10-CM

## 2018-08-15 NOTE — Telephone Encounter (Signed)
[employer] called in regards to urgent paperwork for Dr. [name] to sign for a cancer research program for  this pt. Please advice Ph # [phone] Fax # [phone]

## 2018-08-15 NOTE — Telephone Encounter (Signed)
The form is outside the scope of my practice.  Please ask specialist.

## 2018-08-15 NOTE — Progress Notes (Signed)
Daily Session Note  Patient Details  Name: [name] MRN: [id number] Date of Birth: Sep 08, 1941 Referring Provider:     Pulmonary Rehab Walk Test from 07/02/2018 in [place]  Referring Provider  Dr. Nelda Marseille      Encounter Date: 08/15/2018  Check In: Session Check In - 08/15/18 1006      Check-In   Supervising physician immediately available to respond to emergencies  [facility] immediately available    Physician(s)  Dr. Loleta Books    Location  [facility]    Staff Present  Maurice Small, RN, BSN;[place] Winslow, MS, ACSM RCEP, Exercise Physiologist;Annedrea Stackhouse, RN, [place]    Medication changes reported      No    Fall or balance concerns reported     No    Tobacco Cessation  No Change    Warm-up and Cool-down  Performed as group-led instruction    Resistance Training Performed  Yes    VAD Patient?  No    PAD/SET Patient?  No      Pain Assessment   Currently in Pain?  No/denies    Multiple Pain Sites  No       Capillary Blood Glucose: No results found for this or any previous visit (from the past 24 hour(s)).    Social History   Tobacco Use  Smoking Status Former Smoker  . Packs/day: 1.00  . Years: 8.00  . Pack years: 8.00  . Types: Cigarettes  . Last attempt to quit: 11/21/1983  . Years since quitting: 34.7  Smokeless Tobacco Never Used    Goals Met:  Exercise tolerated well  Goals Unmet:  Not Applicable  Comments: Service time is from 10:30a to 12:30p    Dr. Rush Farmer is Medical Director for Pulmonary Rehab at [facility].

## 2018-08-15 NOTE — Telephone Encounter (Signed)
Employee from [employer] was calling to see if we received the form---I left a message to let her know that it was received but the form is not in our scope of practice

## 2018-08-15 NOTE — Telephone Encounter (Signed)
Just to clarify, paperwork was received and Dr. [name] had me call [name] to inform her that he does not do those types of forms and that he can make a referral for her which he did. So is [name] wanting to know where the forms are? Because he may have shredded them due to the fact that he will not fill them out

## 2018-08-15 NOTE — Telephone Encounter (Signed)
Per previous phone note, Dr.Ellison stated that this is outside of his scope and has referred pt to a specialist, I attempted to call number back and lft a vm

## 2018-08-15 NOTE — Telephone Encounter (Signed)
Paperwork was given to physician when it was received

## 2018-08-16 ENCOUNTER — Telehealth: Payer: Self-pay | Admitting: Endocrinology

## 2018-08-19 ENCOUNTER — Other Ambulatory Visit: Payer: Self-pay | Admitting: *Deleted

## 2018-08-19 MED ORDER — NITROGLYCERIN 0.4 MG SL SUBL: 0.4000 mg | SUBLINGUAL_TABLET | SUBLINGUAL | 1 refills | Status: DC | PRN

## 2018-08-20 ENCOUNTER — Encounter (HOSPITAL_COMMUNITY)
Admission: RE | Admit: 2018-08-20 | Discharge: 2018-08-20 | Disposition: A | Discharge status: Home / Self Care | Payer: Medicare Other | Source: Ambulatory Visit | Attending: Pulmonary Disease | Admitting: Pulmonary Disease

## 2018-08-20 VITALS — Wt 153.9 lb

## 2018-08-20 DIAGNOSIS — J432 Centrilobular emphysema: Secondary | ICD-10-CM

## 2018-08-20 NOTE — Progress Notes (Signed)
Daily Session Note  Patient Details  Name: [name] MRN: [id number] Date of Birth: 30-Aug-1941 Referring Provider:     Pulmonary Rehab Walk Test from 07/02/2018 in [place]  Referring Provider  Dr. Nelda Marseille      Encounter Date: 08/20/2018  Check In: Session Check In - 08/20/18 1235      Check-In   Supervising physician immediately available to respond to emergencies  [facility] immediately available    Physician(s)  Dr. Louanne Belton    Location  [facility]    Staff Present  Maurice Small, RN, BSN;Molly DiVincenzo, MS, ACSM RCEP, Exercise Physiologist;Jullie Arps Ysidro Evert, Felipe Drone, RN, MHA    Medication changes reported      No    Fall or balance concerns reported     No    Tobacco Cessation  No Change    Warm-up and Cool-down  Performed as group-led instruction    Resistance Training Performed  Yes    VAD Patient?  No    PAD/SET Patient?  No      Pain Assessment   Currently in Pain?  No/denies    Pain Score  0-No pain    Multiple Pain Sites  No       Capillary Blood Glucose: No results found for this or any previous visit (from the past 24 hour(s)). POCT Glucose - 08/20/18 1320      POCT Blood Glucose   Pre-Exercise  136 mg/dL    Post-Exercise  128 mg/dL      Exercise Prescription Changes - 08/20/18 1300      Response to Exercise   Blood Pressure (Admit)  130/66    Blood Pressure (Exercise)  144/64    Blood Pressure (Exit)  112/60    Heart Rate (Admit)  79 bpm    Heart Rate (Exercise)  98 bpm    Heart Rate (Exit)  78 bpm    Oxygen Saturation (Admit)  100 %    Oxygen Saturation (Exercise)  96 %    Oxygen Saturation (Exit)  99 %    Rating of Perceived Exertion (Exercise)  12    Perceived Dyspnea (Exercise)  1    Duration  Progress to 45 minutes of aerobic exercise without signs/symptoms of physical distress    Intensity  THRR unchanged      Progression   Progression  Continue to progress  workloads to maintain intensity without signs/symptoms of physical distress.      Resistance Training   Training Prescription  Yes    Weight  blue bands    Reps  10-15    Time  10 Minutes      Oxygen   Oxygen  Continuous    Liters  2      Recumbant Bike   Level  3    Minutes  17      NuStep   Level  4    SPM  80    Minutes  17       Social History   Tobacco Use  Smoking Status Former Smoker  . Packs/day: 1.00  . Years: 8.00  . Pack years: 8.00  . Types: Cigarettes  . Last attempt to quit: 11/21/1983  . Years since quitting: 34.7  Smokeless Tobacco Never Used    Goals Met:  Exercise tolerated well No report of cardiac concerns or symptoms Strength training completed today  Goals Unmet:  Not Applicable   Comments: Service time is from 1030  to 1215    Dr. Rush Farmer is Medical Director for Pulmonary Rehab at [facility].

## 2018-08-22 ENCOUNTER — Encounter (HOSPITAL_COMMUNITY)
Admission: RE | Admit: 2018-08-22 | Discharge: 2018-08-22 | Disposition: A | Discharge status: Home / Self Care | Payer: Medicare Other | Source: Ambulatory Visit | Attending: Pulmonary Disease | Admitting: Pulmonary Disease

## 2018-08-22 DIAGNOSIS — J432 Centrilobular emphysema: Secondary | ICD-10-CM

## 2018-08-22 NOTE — Progress Notes (Signed)
Daily Session Note  Patient Details  Name: [name] MRN: [id number] Date of Birth: Jan 03, 1941 Referring Provider:     Pulmonary Rehab Walk Test from 07/02/2018 in [place]  Referring Provider  Dr. Nelda Marseille      Encounter Date: 08/22/2018  Check In: Session Check In - 08/22/18 1224      Check-In   Supervising physician immediately available to respond to emergencies  [facility] immediately available    Physician(s)  Dr. Horris Latino    Location  [facility]    Staff Present  Maurice Small, RN, BSN;Molly DiVincenzo, MS, ACSM RCEP, Exercise Physiologist;Laketha Leopard Ysidro Evert, Felipe Drone, RN, MHA    Medication changes reported      No    Fall or balance concerns reported     No    Tobacco Cessation  No Change    Warm-up and Cool-down  Not performed (comment)    Resistance Training Performed  Yes    VAD Patient?  No    PAD/SET Patient?  No      Pain Assessment   Currently in Pain?  No/denies    Multiple Pain Sites  No       Capillary Blood Glucose: No results found for this or any previous visit (from the past 24 hour(s)).    Social History   Tobacco Use  Smoking Status Former Smoker  . Packs/day: 1.00  . Years: 8.00  . Pack years: 8.00  . Types: Cigarettes  . Last attempt to quit: 11/21/1983  . Years since quitting: 34.7  Smokeless Tobacco Never Used    Goals Met:  Exercise tolerated well No report of cardiac concerns or symptoms Strength training completed today  Goals Unmet:  Not Applicable  Comments: Service time is from 1030 to 1215    Dr. Rush Farmer is Medical Director for Pulmonary Rehab at [facility].

## 2018-08-26 NOTE — Telephone Encounter (Signed)
error 

## 2018-08-27 ENCOUNTER — Encounter (HOSPITAL_COMMUNITY)
Admission: RE | Admit: 2018-08-27 | Discharge: 2018-08-27 | Disposition: A | Discharge status: Home / Self Care | Payer: Medicare Other | Source: Ambulatory Visit | Attending: Pulmonary Disease | Admitting: Pulmonary Disease

## 2018-08-27 DIAGNOSIS — J432 Centrilobular emphysema: Secondary | ICD-10-CM

## 2018-08-27 NOTE — Progress Notes (Signed)
Daily Session Note  Patient Details  Name: [name] MRN: [id number] Date of Birth: 02/08/1941 Referring Provider:     Pulmonary Rehab Walk Test from 07/02/2018 in [facility]  Referring Provider  Dr. Yacoub      Encounter Date: 08/27/2018  Check In: Session Check In - 08/27/18 1201      Check-In   Supervising physician immediately available to respond to emergencies  [facility] immediately available    Physician(s)  Dr. Ezenduka    Location  [facility]    Staff Present  Molly DiVincenzo, MS, ACSM RCEP, Exercise Physiologist; , RN;Annedrea Stackhouse, RN, MHA    Medication changes reported      No    Fall or balance concerns reported     No    Tobacco Cessation  No Change    Warm-up and Cool-down  Performed as group-led instruction    Resistance Training Performed  Yes    VAD Patient?  No    PAD/SET Patient?  No      Pain Assessment   Currently in Pain?  No/denies       Capillary Blood Glucose: No results found for this or any previous visit (from the past 24 hour(s)).    Social History   Tobacco Use  Smoking Status Former Smoker  . Packs/day: 1.00  . Years: 8.00  . Pack years: 8.00  . Types: Cigarettes  . Last attempt to quit: 11/21/1983  . Years since quitting: 34.7  Smokeless Tobacco Never Used    Goals Met:  Exercise tolerated well No report of cardiac concerns or symptoms Strength training completed today  Goals Unmet:  Not Applicable  Comments: Service time is from 1030 to 1210    Dr. Wesam G. Yacoub is Medical Director for Pulmonary Rehab at [facility]. 

## 2018-08-29 ENCOUNTER — Encounter (HOSPITAL_COMMUNITY)
Admission: RE | Admit: 2018-08-29 | Discharge: 2018-08-29 | Disposition: A | Discharge status: Home / Self Care | Payer: Medicare Other | Source: Ambulatory Visit | Attending: Pulmonary Disease | Admitting: Pulmonary Disease

## 2018-08-29 DIAGNOSIS — J432 Centrilobular emphysema: Secondary | ICD-10-CM | POA: Diagnosis not present

## 2018-08-29 NOTE — Progress Notes (Signed)
Daily Session Note  Patient Details  Name: [name] MRN: [id number] Date of Birth: Jul 25, 1941 Referring Provider:     Pulmonary Rehab Walk Test from 07/02/2018 in [place]  Referring Provider  Dr. Nelda Marseille      Encounter Date: 08/29/2018  Check In: Session Check In - 08/29/18 1250      Check-In   Supervising physician immediately available to respond to emergencies  [facility] immediately available    Physician(s)  Dr. Herbert Moors    Location  [facility]    Staff Present  Su Hilt, MS, ACSM RCEP, Exercise Physiologist;Lisa Colletta [place], RN, MHA;Jaryiah Mehlman Kris Mouton, MS, Exercise Physiologist    Medication changes reported      No    Fall or balance concerns reported     No    Tobacco Cessation  No Change    Warm-up and Cool-down  Performed as group-led instruction    Resistance Training Performed  Yes    VAD Patient?  No    PAD/SET Patient?  No      Pain Assessment   Currently in Pain?  No/denies    Multiple Pain Sites  No       Capillary Blood Glucose: No results found for this or any previous visit (from the past 24 hour(s)).    Social History   Tobacco Use  Smoking Status Former Smoker  . Packs/day: 1.00  . Years: 8.00  . Pack years: 8.00  . Types: Cigarettes  . Last attempt to quit: 11/21/1983  . Years since quitting: 34.7  Smokeless Tobacco Never Used    Goals Met:  Exercise tolerated well  Goals Unmet:  Not Applicable  Comments: Service time is from 10:30a to 12:40p    Dr. Rush Farmer is Medical Director for Pulmonary Rehab at [facility].

## 2018-09-03 ENCOUNTER — Encounter (HOSPITAL_COMMUNITY)
Admission: RE | Admit: 2018-09-03 | Discharge: 2018-09-03 | Disposition: A | Discharge status: Home / Self Care | Payer: Medicare Other | Source: Ambulatory Visit | Attending: Pulmonary Disease | Admitting: Pulmonary Disease

## 2018-09-03 VITALS — Wt 153.0 lb

## 2018-09-03 DIAGNOSIS — J432 Centrilobular emphysema: Secondary | ICD-10-CM

## 2018-09-03 NOTE — Progress Notes (Signed)
Daily Session Note  Patient Details  Name: [name] MRN: [id number] Date of Birth: [birth year] Referring Provider:     Pulmonary Rehab Walk Test from 07/02/2018 in [place]  Referring Provider  Dr. Nelda Marseille      Encounter Date: 09/03/2018  Check In: Session Check In - 09/03/18 1022      Check-In   Supervising physician immediately available to respond to emergencies  [facility] immediately available    Physician(s)  Dr. Herbert Moors    Location  [facility]    Staff Present  Hoy Register, MS, Exercise Physiologist;Lisa Ysidro Evert, Felipe Drone, RN, MHA;Molly DiVincenzo, MS, ACSM RCEP, Exercise Physiologist    Medication changes reported      No    Fall or balance concerns reported     No    Tobacco Cessation  No Change    Warm-up and Cool-down  Performed as group-led instruction    Resistance Training Performed  Yes    VAD Patient?  No    PAD/SET Patient?  No      Pain Assessment   Currently in Pain?  No/denies    Multiple Pain Sites  No       Capillary Blood Glucose: No results found for this or any previous visit (from the past 24 hour(s)). POCT Glucose - 09/03/18 1245      POCT Blood Glucose   Pre-Exercise  133 mg/dL    Post-Exercise  123 mg/dL      Exercise Prescription Changes - 09/03/18 1200      Response to Exercise   Blood Pressure (Admit)  120/56    Blood Pressure (Exercise)  150/60    Blood Pressure (Exit)  122/56    Heart Rate (Admit)  89 bpm    Heart Rate (Exercise)  95 bpm    Heart Rate (Exit)  82 bpm    Oxygen Saturation (Admit)  94 %    Oxygen Saturation (Exercise)  94 %    Oxygen Saturation (Exit)  97 %    Rating of Perceived Exertion (Exercise)  13    Perceived Dyspnea (Exercise)  2    Duration  Progress to 45 minutes of aerobic exercise without signs/symptoms of physical distress    Intensity  THRR unchanged      Progression   Progression  Continue to progress workloads to  maintain intensity without signs/symptoms of physical distress.      Resistance Training   Training Prescription  Yes    Weight  blue bands    Reps  10-15    Time  10 Minutes      Oxygen   Oxygen  Continuous    Liters  2      Recumbant Bike   Level  3    Watts  10    Minutes  17      NuStep   Level  5    SPM  80    Minutes  17    METs  2.2      Track   Laps  7    Minutes  17       Social History   Tobacco Use  Smoking Status Former Smoker  . Packs/day: 1.00  . Years: 8.00  . Pack years: 8.00  . Types: Cigarettes  . Last attempt to quit: 11/21/1983  . Years since quitting: 34.8  Smokeless Tobacco Never Used    Goals Met:  Exercise tolerated well  Goals Unmet:  Not Applicable  Comments: Service time is from 10:30A to 12:00P    Dr. Rush Farmer is Medical Director for Pulmonary Rehab at [facility]

## 2018-09-04 NOTE — Progress Notes (Signed)
Pulmonary Individual Treatment Plan  Patient Details  Name: SHAMERIA TRIMARCO MRN: [id number] Date of Birth: 1941/05/04 Referring Provider:     Pulmonary Rehab Walk Test from 07/02/2018 in [place]  Referring Provider  Dr. Nelda Marseille      Initial Encounter Date:    Pulmonary Rehab Walk Test from 07/02/2018 in [facility]  Date  07/02/18      Visit Diagnosis: Centrilobular emphysema ([place])  Patient's Home Medications on Admission:   Current Outpatient Medications:  .  amLODipine (NORVASC) 10 MG tablet, Take 10 mg by mouth every morning. , Disp: , Rfl:  .  aspirin EC 81 MG tablet, Take 81 mg by mouth daily., Disp: , Rfl:  .  azaTHIOprine (IMURAN) 50 MG tablet, Take 50 mg by mouth daily., Disp: , Rfl:  .  Azelastine HCl 0.15 % SOLN, Place 1 spray into the nose 2 (two) times daily., Disp: , Rfl:  .  calcitRIOL (ROCALTROL) 0.25 MCG capsule, Take 0.25 mcg by mouth daily. , Disp: , Rfl:  .  ferrous sulfate 325 (65 FE) MG tablet, Take 325 mg by mouth daily with breakfast., Disp: , Rfl:  .  fluticasone (FLONASE) 50 MCG/ACT nasal spray, Place 1 spray into both nostrils 2 (two) times daily., Disp: , Rfl:  .  fluticasone furoate-vilanterol (BREO ELLIPTA) 200-25 MCG/INH AEPB, Inhale 1 puff into the lungs daily., Disp: , Rfl:  .  glipiZIDE (GLUCOTROL) 5 MG tablet, Take 0.5 tablets (2.5 mg total) by mouth daily before breakfast., Disp: 30 tablet, Rfl: 1 .  nitroGLYCERIN (NITROSTAT) 0.4 MG SL tablet, Place 1 tablet (0.4 mg total) under the tongue every 5 (five) minutes as needed for chest pain., Disp: 25 tablet, Rfl: 1 .  omeprazole (PRILOSEC) 20 MG capsule, Take 20 mg by mouth daily. , Disp: , Rfl:  .  predniSONE (DELTASONE) 5 MG tablet, Take 1 tablet (5 mg total) by mouth daily., Disp: , Rfl:  .  rosuvastatin (CRESTOR) 10 MG tablet, Take 1 tablet (10 mg total) by mouth daily., Disp: 90 tablet, Rfl: 3 .  sennosides-docusate sodium (SENOKOT-S)  8.6-50 MG tablet, Take 1 tablet by mouth daily., Disp: , Rfl:  .  tacrolimus (PROGRAF) 1 MG capsule, Take 1 mg by mouth 2 (two) times daily. Pt takes 5 capsules (72m) in the morning at 9 am and 6 capsules(627m at 9 pm, Disp: , Rfl:  .  Tiotropium Bromide Monohydrate (SPIRIVA RESPIMAT) 1.25 MCG/ACT AERS, Inhale 1 puff into the lungs daily., Disp: , Rfl:  .  triamterene-hydrochlorothiazide (MAXZIDE-25) 37.5-25 MG tablet, Take 1 tablet by mouth daily. (Patient not taking: Reported on 07/01/2018), Disp: 30 tablet, Rfl: 0  Past Medical History: Past Medical History:  Diagnosis Date  . 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 Asthma   . Coronary artery disease    cath 11/2007, grafts patent /  Nuclear, June, 2011, prior inferior MI with mild peri-infarct ischemia, anterior breast attenuation, EF 67%, done for renal transplant assessment / Low level exercise/Lexiscan Myoview (11/2013): EF 67%, no ischemi; normal study  . Diabetes mellitus    type 2  [facility]   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, 2012  . GERD (gastroesophageal reflux disease)   . GI bleed 11/26/2010   January, 2012 , AVM  . Gout   . History of methicillin resistant staphylococcus  aureus (MRSA)   . Hyperlipidemia   . Hyperparathyroidism   . Hypertension   . Myocardial infarction ([place])   . Osteoporosis   . Pneumonia 08/2010    Hospitalization, October, 2011  . Primary osteoarthritis, 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  . S/P kidney transplant    September, 2012, [facility]  . Subacromial impingement of 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    Tobacco Use: Social History   Tobacco Use  Smoking Status Former Smoker  . Packs/day: 1.00  . Years: 8.00  . Pack years: 8.00  . Types: Cigarettes  . Last attempt to quit: 11/21/1983  . Years since quitting: 34.8  Smokeless Tobacco Never Used    Labs: Recent Review Flowsheet Data    Labs for ITP Cardiac and Pulmonary  Rehab Latest Ref Rng & Units 09/18/2016 02/13/2018 02/14/2018 06/05/2018 06/12/2018   Cholestrol 100 - 199 mg/dL 165 - 182 171 -   LDLCALC 0 - 99 mg/dL 80 - 93 75 -   LDLDIRECT mg/dL - - - - -   HDL >39 mg/dL 63 - 69 71 -   Trlycerides 0 - 149 mg/dL 111 - 102 125 -   Hemoglobin A1c 4.0 - 5.6 % - 5.7(H) - - 5.8(A)   PHART 7.350 - 7.400 - - - - -   PCO2ART 35.0 - 45.0 mmHg - - - - -   HCO3 20.0 - 24.0 mEq/L - - - - -   TCO2 0 - 100 mmol/L - - - - -   ACIDBASEDEF 0.0 - 2.0 mmol/L - - - - -   O2SAT % - - - - -      Capillary Blood Glucose: Lab Results  Component Value Date   GLUCAP 153 (H) 02/21/2018   GLUCAP 170 (H) 02/21/2018   GLUCAP 123 (H) 02/21/2018   GLUCAP 119 (H) 02/21/2018   GLUCAP 138 (H) 02/20/2018   POCT Glucose    Row Name 07/09/18 1249 07/23/18 1236 08/06/18 1258 08/20/18 1320 09/03/18 1245     POCT Blood Glucose   Pre-Exercise  87 mg/dL Snack before exercise  128 mg/dL  155 mg/dL  136 mg/dL  133 mg/dL   Post-Exercise  144 mg/dL  140 mg/dL  140 mg/dL  128 mg/dL  123 mg/dL   Pre-Exercise #2  124 mg/dL  -  -  -  -      Pulmonary Assessment Scores: Pulmonary Assessment Scores    Row Name 07/04/18 0730         ADL UCSD   ADL Phase  Entry       mMRC Score   mMRC Score  3        Pulmonary Function Assessment:   Exercise Target Goals: Exercise Program Goal: Individual exercise prescription set using results from initial 6 min walk test and THRR while considering  patient's activity barriers and safety.   Exercise Prescription Goal: Initial exercise prescription builds to 30-45 minutes a day of aerobic activity, 2-3 days per week.  Home exercise guidelines will be given to patient during program as part of exercise prescription that the participant will acknowledge.  Activity Barriers & Risk Stratification: Activity Barriers & Cardiac Risk Stratification - 07/01/18 1041      Activity Barriers & Cardiac Risk Stratification   Activity Barriers  Left Knee  Replacement;Joint Problems;Left Hip Replacement;Shortness of Breath;Arthritis;Back Problems   shoulder surgery bilateral      6 Minute Walk: 6 Minute Walk    Row Name 07/04/18 [phone]  6 Minute Walk   Phase  Initial     Distance  1113 feet     Walk Time  6 minutes     # of Rest Breaks  0     MPH  2.1     METS  2.61     RPE  12     Perceived Dyspnea   1     Symptoms  Yes (comment)     Comments  used wheelchair/leg fatigue     Resting HR  88 bpm     Resting BP  144/80     Resting Oxygen Saturation   91 %     Exercise Oxygen Saturation  during 6 min walk  89 %     Max Ex. HR  112 bpm     Max Ex. BP  194/80     2 Minute Post BP  148/68       Interval HR   1 Minute HR  98     2 Minute HR  104     3 Minute HR  108     4 Minute HR  108     5 Minute HR  110     6 Minute HR  112     2 Minute Post HR  98     Interval Heart Rate?  Yes       Interval Oxygen   Interval Oxygen?  Yes     Baseline Oxygen Saturation %  91 %     1 Minute Oxygen Saturation %  93 %     1 Minute Liters of Oxygen  2 L     2 Minute Oxygen Saturation %  90 %     2 Minute Liters of Oxygen  2 L     3 Minute Oxygen Saturation %  89 %     3 Minute Liters of Oxygen  2 L     4 Minute Oxygen Saturation %  90 %     4 Minute Liters of Oxygen  2 L     5 Minute Oxygen Saturation %  91 %     5 Minute Liters of Oxygen  2 L     6 Minute Oxygen Saturation %  91 %     6 Minute Liters of Oxygen  2 L     2 Minute Post Oxygen Saturation %  95 %     2 Minute Post Liters of Oxygen  2 L        Oxygen Initial Assessment: Oxygen Initial Assessment - 07/04/18 0730      Initial 6 min Walk   Oxygen Used  Continuous    Liters per minute  2      Program Oxygen Prescription   Program Oxygen Prescription  Continuous;E-Tanks    Liters per minute  2       Oxygen Re-Evaluation: Oxygen Re-Evaluation    Row Name 07/08/18 1621 08/05/18 1423 09/03/18 1548         Program Oxygen Prescription   Program Oxygen  Prescription  Continuous;E-Tanks  Continuous;E-Tanks  Continuous;E-Tanks     Liters per minute  '2  2  2       ' Home Oxygen   Home Oxygen Device  E-Tanks;Home Concentrator  E-Tanks;Home Concentrator  E-Tanks;Home Concentrator     Sleep Oxygen Prescription  Continuous  Continuous  Continuous     Liters per minute  '2  2  2     ' Home  Exercise Oxygen Prescription  Continuous  Continuous  Continuous     Liters per minute  '2  2  2     ' Home at Rest Exercise Oxygen Prescription  Continuous  Continuous  Continuous     Liters per minute  -  2  2     Compliance with Home Oxygen Use  Yes  Yes  Yes       Goals/Expected Outcomes   Short Term Goals  To learn and exhibit compliance with exercise, home and travel O2 prescription;To learn and understand importance of monitoring SPO2 with pulse oximeter and demonstrate accurate use of the pulse oximeter.;To learn and understand importance of maintaining oxygen saturations>88%;To learn and demonstrate proper pursed lip breathing techniques or other breathing techniques.;To learn and demonstrate proper use of respiratory medications  To learn and exhibit compliance with exercise, home and travel O2 prescription;To learn and understand importance of monitoring SPO2 with pulse oximeter and demonstrate accurate use of the pulse oximeter.;To learn and understand importance of maintaining oxygen saturations>88%;To learn and demonstrate proper pursed lip breathing techniques or other breathing techniques.;To learn and demonstrate proper use of respiratory medications  To learn and exhibit compliance with exercise, home and travel O2 prescription;To learn and understand importance of monitoring SPO2 with pulse oximeter and demonstrate accurate use of the pulse oximeter.;To learn and understand importance of maintaining oxygen saturations>88%;To learn and demonstrate proper pursed lip breathing techniques or other breathing techniques.;To learn and demonstrate proper use of  respiratory medications     Long  Term Goals  Exhibits compliance with exercise, home and travel O2 prescription;Verbalizes importance of monitoring SPO2 with pulse oximeter and return demonstration;Maintenance of O2 saturations>88%;Exhibits proper breathing techniques, such as pursed lip breathing or other method taught during program session;Compliance with respiratory medication;Demonstrates proper use of MDI's  Exhibits compliance with exercise, home and travel O2 prescription;Verbalizes importance of monitoring SPO2 with pulse oximeter and return demonstration;Maintenance of O2 saturations>88%;Exhibits proper breathing techniques, such as pursed lip breathing or other method taught during program session;Compliance with respiratory medication;Demonstrates proper use of MDI's  Exhibits compliance with exercise, home and travel O2 prescription;Verbalizes importance of monitoring SPO2 with pulse oximeter and return demonstration;Maintenance of O2 saturations>88%;Exhibits proper breathing techniques, such as pursed lip breathing or other method taught during program session;Compliance with respiratory medication;Demonstrates proper use of MDI's     Goals/Expected Outcomes  compliance  compliance  compliance        Oxygen Discharge (Final Oxygen Re-Evaluation): Oxygen Re-Evaluation - 09/03/18 1548      Program Oxygen Prescription   Program Oxygen Prescription  Continuous;E-Tanks    Liters per minute  2      Home Oxygen   Home Oxygen Device  E-Tanks;Home Concentrator    Sleep Oxygen Prescription  Continuous    Liters per minute  2    Home Exercise Oxygen Prescription  Continuous    Liters per minute  2    Home at Rest Exercise Oxygen Prescription  Continuous    Liters per minute  2    Compliance with Home Oxygen Use  Yes      Goals/Expected Outcomes   Short Term Goals  To learn and exhibit compliance with exercise, home and travel O2 prescription;To learn and understand importance of  monitoring SPO2 with pulse oximeter and demonstrate accurate use of the pulse oximeter.;To learn and understand importance of maintaining oxygen saturations>88%;To learn and demonstrate proper pursed lip breathing techniques or other breathing techniques.;To learn and demonstrate proper use of respiratory  medications    Long  Term Goals  Exhibits compliance with exercise, home and travel O2 prescription;Verbalizes importance of monitoring SPO2 with pulse oximeter and return demonstration;Maintenance of O2 saturations>88%;Exhibits proper breathing techniques, such as pursed lip breathing or other method taught during program session;Compliance with respiratory medication;Demonstrates proper use of MDI's    Goals/Expected Outcomes  compliance       Initial Exercise Prescription: Initial Exercise Prescription - 07/04/18 0700      Date of Initial Exercise RX and Referring Provider   Date  07/02/18    Referring Provider  Dr. Nelda Marseille      Oxygen   Oxygen  Continuous    Liters  2      Recumbant Bike   Level  2    Watts  10    Minutes  17      NuStep   Level  2    SPM  80    Minutes  17    METs  1.5      Track   Laps  7    Minutes  17      Prescription Details   Frequency (times per week)  2    Duration  Progress to 45 minutes of aerobic exercise without signs/symptoms of physical distress      Intensity   THRR 40-80% of Max Heartrate  58-116    Ratings of Perceived Exertion  11-13    Perceived Dyspnea  0-4      Progression   Progression  Continue progressive overload as per policy without signs/symptoms or physical distress.      Resistance Training   Training Prescription  Yes    Weight  BLUE BANDS    Reps  10-15       Perform Capillary Blood Glucose checks as needed.  Exercise Prescription Changes: Exercise Prescription Changes    Row Name 07/09/18 1200 07/23/18 1200 08/06/18 1200 08/08/18 1005 08/20/18 1300     Response to Exercise   Blood Pressure (Admit)   148/80  148/70  110/50  -  130/66   Blood Pressure (Exercise)  160/64  168/68  160/60  -  144/64   Blood Pressure (Exit)  154/80  122/60  122/60  -  112/60   Heart Rate (Admit)  88 bpm  88 bpm  87 bpm  -  79 bpm   Heart Rate (Exercise)  123 bpm  103 bpm  97 bpm  -  98 bpm   Heart Rate (Exit)  97 bpm  90 bpm  80 bpm  -  78 bpm   Oxygen Saturation (Admit)  96 %  99 %  96 %  -  100 %   Oxygen Saturation (Exercise)  91 %  92 %  94 %  -  96 %   Oxygen Saturation (Exit)  97 %  98 %  96 %  -  99 %   Rating of Perceived Exertion (Exercise)  '15  11  13  ' -  12   Perceived Dyspnea (Exercise)  '3  1  2  ' -  1   Duration  Progress to 45 minutes of aerobic exercise without signs/symptoms of physical distress  Progress to 45 minutes of aerobic exercise without signs/symptoms of physical distress  Progress to 45 minutes of aerobic exercise without signs/symptoms of physical distress  -  Progress to 45 minutes of aerobic exercise without signs/symptoms of physical distress   Intensity  Other (comment) 40-80% od HRR  Other (  comment) 40-80% od HRR  Other (comment) 40-80% od HRR  -  THRR unchanged     Progression   Progression  Continue to progress workloads to maintain intensity without signs/symptoms of physical distress.  Continue to progress workloads to maintain intensity without signs/symptoms of physical distress.  Continue to progress workloads to maintain intensity without signs/symptoms of physical distress.  -  Continue to progress workloads to maintain intensity without signs/symptoms of physical distress.     Resistance Training   Training Prescription  Yes  Yes  Yes  -  Yes   Weight  blue bands  blue bands  blue bands  -  blue bands   Reps  10-15  10-15  10-15  -  10-15   Time  10 Minutes  10 Minutes  10 Minutes  -  10 Minutes     Oxygen   Oxygen  Continuous  Continuous  Continuous  -  Continuous   Liters  '2  2  2  ' -  2     Recumbant Bike   Level  '1  2  2  ' -  3   Minutes  '17  17  17  ' -  17      NuStep   Level  -  2  2  -  4   SPM  -  80  80  -  80   Minutes  -  17  17  -  17   METs  -  1.8  1.8  -  -     Track   Laps  -  9  9  -  -   Minutes  '17  17  17  ' -  -     Home Exercise Plan   Plans to continue exercise at  -  -  -  Home (comment)  -   Frequency  -  -  -  Add 2 additional days to program exercise sessions.  -   Row Name 09/03/18 1200             Response to Exercise   Blood Pressure (Admit)  120/56       Blood Pressure (Exercise)  150/60       Blood Pressure (Exit)  122/56       Heart Rate (Admit)  89 bpm       Heart Rate (Exercise)  95 bpm       Heart Rate (Exit)  82 bpm       Oxygen Saturation (Admit)  94 %       Oxygen Saturation (Exercise)  94 %       Oxygen Saturation (Exit)  97 %       Rating of Perceived Exertion (Exercise)  13       Perceived Dyspnea (Exercise)  2       Duration  Progress to 45 minutes of aerobic exercise without signs/symptoms of physical distress       Intensity  THRR unchanged         Progression   Progression  Continue to progress workloads to maintain intensity without signs/symptoms of physical distress.         Resistance Training   Training Prescription  Yes       Weight  blue bands       Reps  10-15       Time  10 Minutes         Oxygen   Oxygen  Continuous       Liters  2         Recumbant Bike   Level  3       Watts  10       Minutes  17         NuStep   Level  5       SPM  80       Minutes  17       METs  2.2         Track   Laps  7       Minutes  17          Exercise Comments: Exercise Comments    Row Name 08/09/18 1004           Exercise Comments  Home exercise completed          Exercise Goals and Review: Exercise Goals    [place] Name 07/01/18 1042             Exercise Goals   Increase Physical Activity  Yes       Intervention  Provide advice, education, support and counseling about physical activity/exercise needs.;Develop an individualized exercise prescription for aerobic and  resistive training based on initial evaluation findings, risk stratification, comorbidities and participant's personal goals.       Expected Outcomes  Short Term: Attend rehab on a regular basis to increase amount of physical activity.;Long Term: Add in home exercise to make exercise part of routine and to increase amount of physical activity.;Long Term: Exercising regularly at least 3-5 days a week.       Increase Strength and Stamina  Yes       Intervention  Provide advice, education, support and counseling about physical activity/exercise needs.;Develop an individualized exercise prescription for aerobic and resistive training based on initial evaluation findings, risk stratification, comorbidities and participant's personal goals.       Expected Outcomes  Short Term: Increase workloads from initial exercise prescription for resistance, speed, and METs.;Short Term: Perform resistance training exercises routinely during rehab and add in resistance training at home;Long Term: Improve cardiorespiratory fitness, muscular endurance and strength as measured by increased METs and functional capacity (6MWT)       Able to understand and use rate of perceived exertion (RPE) scale  Yes       Intervention  Provide education and explanation on how to use RPE scale       Expected Outcomes  Short Term: Able to use RPE daily in rehab to express subjective intensity level;Long Term:  Able to use RPE to guide intensity level when exercising independently       Able to understand and use Dyspnea scale  Yes       Intervention  Provide education and explanation on how to use Dyspnea scale       Expected Outcomes  Short Term: Able to use Dyspnea scale daily in rehab to express subjective sense of shortness of breath during exertion;Long Term: Able to use Dyspnea scale to guide intensity level when exercising independently       Knowledge and understanding of Target Heart Rate Range (THRR)  Yes       Intervention  Provide  education and explanation of THRR including how the numbers were predicted and where they are located for reference       Expected Outcomes  Short Term: Able to state/look up THRR;Short Term: Able to use daily as guideline for intensity in  rehab;Long Term: Able to use THRR to govern intensity when exercising independently       Understanding of Exercise Prescription  Yes       Intervention  Provide education, explanation, and written materials on patient's individual exercise prescription       Expected Outcomes  Short Term: Able to explain program exercise prescription;Long Term: Able to explain home exercise prescription to exercise independently          Exercise Goals Re-Evaluation : Exercise Goals Re-Evaluation    Row Name 07/08/18 1621 08/05/18 1424 09/03/18 1548         Exercise Goal Re-Evaluation   Exercise Goals Review  Increase Physical Activity;Increase Strength and Stamina;Able to understand and use rate of perceived exertion (RPE) scale;Able to understand and use Dyspnea scale;Knowledge and understanding of Target Heart Rate Range (THRR);Understanding of Exercise Prescription  Increase Physical Activity;Increase Strength and Stamina;Able to understand and use rate of perceived exertion (RPE) scale;Able to understand and use Dyspnea scale;Knowledge and understanding of Target Heart Rate Range (THRR);Understanding of Exercise Prescription  Increase Physical Activity;Increase Strength and Stamina;Able to understand and use rate of perceived exertion (RPE) scale;Able to understand and use Dyspnea scale;Knowledge and understanding of Target Heart Rate Range (THRR);Understanding of Exercise Prescription     Comments  Patient will start first day of rehab on 07/09/18  Patient is progressing well in rehab. She is able to walk up to 9 laps (200 ft each) in 15 minutes. Will encourage home exercise. Will monitor and motivate.  Patient is progressing well in rehab. She is able to walk up to 9-11 laps  (200 ft each) in 15 minutes. MET average places her in a lower level 2.2. Will encourage home exercise. Will monitor and motivate.     Expected Outcomes  -  Through exercise at home and rehab, patient will build endurance and be able to perform ADL's with less shortness of breath, Patient will also feel comfrotable estblaishing an exercise routine at home.  Through exercise at rehab and at home, patient will increase functional ability, have a decrease in sob with ADL's, and establish an exercise regimine at home.        Discharge Exercise Prescription (Final Exercise Prescription Changes): Exercise Prescription Changes - 09/03/18 1200      Response to Exercise   Blood Pressure (Admit)  120/56    Blood Pressure (Exercise)  150/60    Blood Pressure (Exit)  122/56    Heart Rate (Admit)  89 bpm    Heart Rate (Exercise)  95 bpm    Heart Rate (Exit)  82 bpm    Oxygen Saturation (Admit)  94 %    Oxygen Saturation (Exercise)  94 %    Oxygen Saturation (Exit)  97 %    Rating of Perceived Exertion (Exercise)  13    Perceived Dyspnea (Exercise)  2    Duration  Progress to 45 minutes of aerobic exercise without signs/symptoms of physical distress    Intensity  THRR unchanged      Progression   Progression  Continue to progress workloads to maintain intensity without signs/symptoms of physical distress.      Resistance Training   Training Prescription  Yes    Weight  blue bands    Reps  10-15    Time  10 Minutes      Oxygen   Oxygen  Continuous    Liters  2      Recumbant Bike   Level  3  Watts  10    Minutes  17      NuStep   Level  5    SPM  80    Minutes  17    METs  2.2      Track   Laps  7    Minutes  17       Nutrition:  Target Goals: Understanding of nutrition guidelines, daily intake of sodium <1541m, cholesterol <2077m calories 30% from fat and 7% or less from saturated fats, daily to have 5 or more servings of fruits and  vegetables.  Biometrics:    Nutrition Therapy Plan and Nutrition Goals: Nutrition Therapy & Goals - 07/26/18 1150      Nutrition Therapy   Diet  consistent carbohydrate heart healthy      Personal Nutrition Goals   Nutrition Goal  The pt will recognize symptoms that can interfere with adequate oral intake    Personal Goal #2  Identify food quantities necessary to achieve wt loss of  -2# per week to a goal wt loss of 2.7-10.9 kg (6-15 lb) at graduation from pulmonary rehab.    Personal Goal #3  Pt to identify and limit food sources of sodium,saturated fat, trans fat, refined carbohydrates      Intervention Plan   Intervention  Prescribe, educate and counsel regarding individualized specific dietary modifications aiming towards targeted core components such as weight, hypertension, lipid management, diabetes, heart failure and other comorbidities.    Expected Outcomes  Short Term Goal: Understand basic principles of dietary content, such as calories, fat, sodium, cholesterol and nutrients.       Nutrition Assessments: Nutrition Assessments - 07/02/18 1351      Rate Your Plate Scores   Pre Score  46       Nutrition Goals Re-Evaluation:   Nutrition Goals Discharge (Final Nutrition Goals Re-Evaluation):   Psychosocial: Target Goals: Acknowledge presence or absence of significant depression and/or stress, maximize coping skills, provide positive support system. Participant is able to verbalize types and ability to use techniques and skills needed for reducing stress and depression.  Initial Review & Psychosocial Screening: Initial Psych Review & Screening - 07/01/18 1045      Initial Review   Current issues with  None Identified      Family Dynamics   Good Support System?  No    Strains  Intra-family strains    Comments  [place] four [place] or concerned about her health.  Gr[place]olanda Bonineelps with errands.       Barriers   Psychosocial  barriers to participate in program  The patient should benefit from training in stress management and relaxation.      Screening Interventions   Interventions  Encouraged to exercise    Expected Outcomes  Short Term goal: Identification and review with participant of any Quality of Life or Depression concerns found by scoring the questionnaire.;Long Term goal: The participant improves quality of Life and PHQ9 Scores as seen by post scores and/or verbalization of changes       Quality of Life Scores:  Scores of 19 and below usually indicate a poorer quality of life in these areas.  A difference of  2-3 points is a clinically meaningful difference.  A difference of 2-3 points in the total score of the Quality of Life Index has been associated with significant improvement in overall quality of life, self-image, physical symptoms, and general health in studies assessing change in  quality of life.  PHQ-9: Recent Review Flowsheet Data    Depression screen [facility] 2/9 07/01/2018   Decreased Interest 0   Down, Depressed, Hopeless 0   PHQ - 2 Score 0   Altered sleeping 0   Tired, decreased energy 2   Change in appetite 0   Feeling bad or failure about yourself  0   Trouble concentrating 0   Moving slowly or fidgety/restless 0   PHQ-9 Score 2   Difficult doing work/chores Not difficult at all     Interpretation of Total Score  Total Score Depression Severity:  1-4 = Minimal depression, 5-9 = Mild depression, 10-14 = Moderate depression, 15-19 = Moderately severe depression, 20-27 = Severe depression   Psychosocial Evaluation and Intervention: Psychosocial Evaluation - 09/04/18 1618      Psychosocial Evaluation & Interventions   Interventions  Stress management education;Relaxation education;Encouraged to exercise with the program and follow exercise prescription    Comments  Although pt has intra family strains, pt has resolved this wihin herself. Pt interacts well with staff and likes to  particpate in exercise at Pulmonary Rehab    Expected Outcomes  Pt will continue to display positive and healthy coping skills and have peace with the relationship she has with her children    Continue Psychosocial Services   Follow up required by staff       Psychosocial Re-Evaluation: Psychosocial Re-Evaluation    [place] Name 07/01/18 1052 07/09/18 1732 08/07/18 1453         Psychosocial Re-Evaluation   Current issues with  None Identified  None Identified  None Identified     Comments  no identifiable barriers  no identifiable barriers  no identifiable barriers     Expected Outcomes  -  Pt will remain at peace in regards to her relationship with her children.  Pt will have a hopefull outlook on her future.  Pt will remain at peace in regards to her relationship with her children.  Pt will have a hopefull outlook on her future.     Interventions  Relaxation education;Encouraged to attend Pulmonary Rehabilitation for the exercise;Stress management education  -  Relaxation education;Encouraged to attend Pulmonary Rehabilitation for the exercise;Stress management education     Continue Psychosocial Services   -  Follow up required by staff  Follow up required by staff        Psychosocial Discharge (Final Psychosocial Re-Evaluation): Psychosocial Re-Evaluation - 08/07/18 1453      Psychosocial Re-Evaluation   Current issues with  None Identified    Comments  no identifiable barriers    Expected Outcomes  Pt will remain at peace in regards to her relationship with her children.  Pt will have a hopefull outlook on her future.    Interventions  Relaxation education;Encouraged to attend Pulmonary Rehabilitation for the exercise;Stress management education    Continue Psychosocial Services   Follow up required by staff       Education: Education Goals: Education classes will be provided on a weekly basis, covering required topics. Participant will state understanding/return demonstration of  topics presented.  Learning Barriers/Preferences: Learning Barriers/Preferences - 07/01/18 1053      Learning Barriers/Preferences   Learning Barriers  Sight    Learning Preferences  Group Instruction;Individual Instruction;Verbal Instruction;Written Material       Education Topics: Risk Factor Reduction:  -Group instruction that is supported by a PowerPoint presentation. Instructor discusses the definition of a risk factor, different risk factors for pulmonary disease, and how  the heart and lungs work together.     Nutrition for Pulmonary Patient:  -Group instruction provided by PowerPoint slides, verbal discussion, and written materials to support subject matter. The instructor gives an explanation and review of healthy diet recommendations, which includes a discussion on weight management, recommendations for fruit and vegetable consumption, as well as protein, fluid, caffeine, fiber, sodium, sugar, and alcohol. Tips for eating when patients are short of breath are discussed.   PULMONARY REHAB OTHER RESPIRATORY from 08/29/2018 in [place]  Date  07/18/18  Educator  Rodman Pickle  Instruction Review Code  2- Demonstrated Understanding      Pursed Lip Breathing:  -Group instruction that is supported by demonstration and informational handouts. Instructor discusses the benefits of pursed lip and diaphragmatic breathing and detailed demonstration on how to preform both.     Oxygen Safety:  -Group instruction provided by PowerPoint, verbal discussion, and written material to support subject matter. There is an overview of "What is Oxygen" and "Why do we need it".  Instructor also reviews how to create a safe environment for oxygen use, the importance of using oxygen as prescribed, and the risks of noncompliance. There is a brief discussion on traveling with oxygen and resources the patient may utilize.   PULMONARY REHAB OTHER RESPIRATORY from 08/29/2018 in  [place]  Date  08/22/18  Educator  molly  Instruction Review Code  1- Verbalizes Understanding      Oxygen Equipment:  -Group instruction provided by [employer], written materials, and Insurance underwriter.   PULMONARY REHAB OTHER RESPIRATORY from 08/29/2018 in [place]  Date  08/29/18  Educator  Ace Gins  Instruction Review Code  1- Verbalizes Understanding      Signs and Symptoms:  -Group instruction provided by written material and verbal discussion to support subject matter. Warning signs and symptoms of infection, stroke, and heart attack are reviewed and when to call the physician/911 reinforced. Tips for preventing the spread of infection discussed.   PULMONARY REHAB OTHER RESPIRATORY from 08/29/2018 in [place]  Date  08/15/18  Educator  RN  Instruction Review Code  2- Demonstrated Understanding      Advanced Directives:  -Group instruction provided by verbal instruction and written material to support subject matter. Instructor reviews Advanced Directive laws and proper instruction for filling out document.   Pulmonary Video:  -Group video education that reviews the importance of medication and oxygen compliance, exercise, good nutrition, pulmonary hygiene, and pursed lip and diaphragmatic breathing for the pulmonary patient.   Exercise for the Pulmonary Patient:  -Group instruction that is supported by a PowerPoint presentation. Instructor discusses benefits of exercise, core components of exercise, frequency, duration, and intensity of an exercise routine, importance of utilizing pulse oximetry during exercise, safety while exercising, and options of places to exercise outside of rehab.     Pulmonary Medications:  -Verbally interactive group education provided by instructor with focus on inhaled medications and proper administration.    PULMONARY REHAB OTHER RESPIRATORY from 08/29/2018 in [place]  Date  07/30/18  Instruction Review Code  1- Verbalizes Understanding      Anatomy and Physiology of the Respiratory System and Intimacy:  -Group instruction provided by PowerPoint, verbal discussion, and written material to support subject matter. Instructor reviews respiratory cycle and anatomical components of the respiratory system and their functions. Instructor also reviews differences in  obstructive and restrictive respiratory diseases with examples of each. Intimacy, Sex, and Sexuality differences are reviewed with a discussion on how relationships can change when diagnosed with pulmonary disease. Common sexual concerns are reviewed.   PULMONARY REHAB OTHER RESPIRATORY from 08/29/2018 in [place]  Date  07/11/18  Educator  rn  Instruction Review Code  2- Demonstrated Understanding      MD DAY -A group question and answer session with a medical doctor that allows participants to ask questions that relate to their pulmonary disease state.   OTHER EDUCATION -Group or individual verbal, written, or video instructions that support the educational goals of the pulmonary rehab program.   Holiday Eating Survival Tips:  -Group instruction provided by PowerPoint slides, verbal discussion, and written materials to support subject matter. The instructor gives patients tips, tricks, and techniques to help them not only survive but enjoy the holidays despite the onslaught of food that accompanies the holidays.   Knowledge Questionnaire Score:   Core Components/Risk Factors/Patient Goals at Admission: Personal Goals and Risk Factors at Admission - 07/01/18 1053      Core Components/Risk Factors/Patient Goals on Admission    Weight Management  Yes;Weight Maintenance    Intervention  Weight Management: Develop a combined nutrition and exercise program designed to  reach desired caloric intake, while maintaining appropriate intake of nutrient and fiber, sodium and fats, and appropriate energy expenditure required for the weight goal.;Weight Management: Provide education and appropriate resources to help participant work on and attain dietary goals.;Weight Management/Obesity: Establish reasonable short term and long term weight goals.    Admit Weight  156 lb 4.9 oz (70.9 kg)    Expected Outcomes  Understanding of distribution of calorie intake throughout the day with the consumption of 4-5 meals/snacks;Understanding recommendations for meals to include 15-35% energy as protein, 25-35% energy from fat, 35-60% energy from carbohydrates, less than 222m of dietary cholesterol, 20-35 gm of total fiber daily;Weight Maintenance: Understanding of the daily nutrition guidelines, which includes 25-35% calories from fat, 7% or less cal from saturated fats, less than 2013mcholesterol, less than 1.5gm of sodium, & 5 or more servings of fruits and vegetables daily;Short Term: Continue to assess and modify interventions until short term weight is achieved;Long Term: Adherence to nutrition and physical activity/exercise program aimed toward attainment of established weight goal    Improve shortness of breath with ADL's  Yes    Intervention  Provide education, individualized exercise plan and daily activity instruction to help decrease symptoms of SOB with activities of daily living.    Expected Outcomes  Short Term: Improve cardiorespiratory fitness to achieve a reduction of symptoms when performing ADLs;Long Term: Be able to perform more ADLs without symptoms or delay the onset of symptoms    Diabetes  Yes    Intervention  Provide education about signs/symptoms and action to take for hypo/hyperglycemia.;Provide education about proper nutrition, including hydration, and aerobic/resistive exercise prescription along with prescribed medications to achieve blood glucose in normal ranges:  Fasting glucose 65-99 mg/dL    Expected Outcomes  Short Term: Participant verbalizes understanding of the signs/symptoms and immediate care of hyper/hypoglycemia, proper foot care and importance of medication, aerobic/resistive exercise and nutrition plan for blood glucose control.;Long Term: Attainment of HbA1C < 7%.    Hypertension  Yes    Intervention  Provide education on lifestyle modifcations including regular physical activity/exercise, weight management, moderate sodium restriction and increased consumption of fresh fruit, vegetables, and low fat dairy, alcohol moderation,  and smoking cessation.;Monitor prescription use compliance.    Expected Outcomes  Short Term: Continued assessment and intervention until BP is < 140/27m HG in hypertensive participants. < 130/842mHG in hypertensive participants with diabetes, heart failure or chronic kidney disease.;Long Term: Maintenance of blood pressure at goal levels.       Core Components/Risk Factors/Patient Goals Review:  Goals and Risk Factor Review    Row Name 07/09/18 1734 07/10/18 0951 08/07/18 1454 09/04/18 1625       Core Components/Risk Factors/Patient Goals Review   Personal Goals Review  Weight Management/Obesity;Improve shortness of breath with ADL's;Increase knowledge of respiratory medications and ability to use respiratory devices properly.;Heart Failure;Hypertension;Diabetes;Develop more efficient breathing techniques such as purse lipped breathing and diaphragmatic breathing and practicing self-pacing with activity.  -  Weight Management/Obesity;Improve shortness of breath with ADL's;Increase knowledge of respiratory medications and ability to use respiratory devices properly.;Heart Failure;Hypertension;Diabetes;Develop more efficient breathing techniques such as purse lipped breathing and diaphragmatic breathing and practicing self-pacing with activity.  Weight Management/Obesity;Improve shortness of breath with ADL's;Increase  knowledge of respiratory medications and ability to use respiratory devices properly.;Heart Failure;Hypertension;Diabetes;Develop more efficient breathing techniques such as purse lipped breathing and diaphragmatic breathing and practicing self-pacing with activity.    Review  Pt started pulmonary rehab today - 8/20.  Unable to adequately assess pt progress toward pulmonary rehab goals.  Pt started pulmonary rehab today - 8/20.  Unable to adequately assess pt progress toward pulmonary rehab goals. Anticipate pt will show measurable progress during the next 30 day assessment.  Pt has completed 9 exercise sessions.  Pt weight remains unchanged from inital session.  Pt attended respiratory medication education class.  Pt developing technique for PLB techniques. Bp remain slightly elevated 140's  Pt rates dypnea level 1-2.  Pt reports CBG within normal limits this is an improvement from earlier sessions.  Pt understands the caloric needs for exercise.  Pt workloads remain flat with level 2 for recumbent bike and nustep.  pt averages 9 laps on the track.  Continue to monitor pt progress during teh next 30 day assessment  Pt has completed 17 exercise sessions and 7 education classes.  Pt weight decreased to .8 kg from 8/20.   Pt developing techniques for PLB techniques needs verbal reminders.  Pt rates dypnea level 1-2.  Pt reports CBG within normal limits this is an improvement from earlier sessions.  Pt understands the caloric needs for exercise.  Pt workloads increase to level 3 for recumbent bike and nustep.  pt averages 7-9 laps on the track.  Continue to monitor pt progress during the next 30 day review    Expected Outcomes  See Admission Goals  See Admission Goals/Outocmes  See Admission Goals/Outocmes  See Admission Goals/Outocmes       Core Components/Risk Factors/Patient Goals at Discharge (Final Review):  Goals and Risk Factor Review - 09/04/18 1625      Core Components/Risk Factors/Patient Goals  Review   Personal Goals Review  Weight Management/Obesity;Improve shortness of breath with ADL's;Increase knowledge of respiratory medications and ability to use respiratory devices properly.;Heart Failure;Hypertension;Diabetes;Develop more efficient breathing techniques such as purse lipped breathing and diaphragmatic breathing and practicing self-pacing with activity.    Review  Pt has completed 17 exercise sessions and 7 education classes.  Pt weight decreased to .8 kg from 8/20.   Pt developing techniques for PLB techniques needs verbal reminders.  Pt rates dypnea level 1-2.  Pt reports CBG within normal limits this is an improvement from earlier  sessions.  Pt understands the caloric needs for exercise.  Pt workloads increase to level 3 for recumbent bike and nustep.  pt averages 7-9 laps on the track.  Continue to monitor pt progress during the next 30 day review    Expected Outcomes  See Admission Goals/Outocmes       ITP Comments: ITP Comments    Row Name 07/01/18 1025 07/10/18 0951 08/07/18 1452 09/04/18 1618     ITP Comments  Dr. Jennet Maduro, medicare Director  Dr. Jennet Maduro, medicare Director  Dr. Jennet Maduro, medical Director Pulmonary Rehab  Dr. Jennet Maduro, medical Director Pulmonary Rehab       Comments:  Pt completed 17 exercise sessions. Continue to monitor progress. Cherre Huger, BSN Cardiac and Training and development officer

## 2018-09-05 ENCOUNTER — Encounter (HOSPITAL_COMMUNITY)
Admission: RE | Admit: 2018-09-05 | Discharge: 2018-09-05 | Disposition: A | Discharge status: Home / Self Care | Payer: Medicare Other | Source: Ambulatory Visit | Attending: Pulmonary Disease | Admitting: Pulmonary Disease

## 2018-09-05 DIAGNOSIS — J432 Centrilobular emphysema: Secondary | ICD-10-CM | POA: Diagnosis not present

## 2018-09-05 NOTE — Progress Notes (Signed)
Daily Session Note  Patient Details  Name: [name] MRN: [id number] Date of Birth: 07-Oct-1941 Referring Provider:     Pulmonary Rehab Walk Test from 07/02/2018 in [place]  Referring Provider  Dr. Nelda Marseille      Encounter Date: 09/05/2018  Check In: Session Check In - 09/05/18 1049      Check-In   Supervising physician immediately available to respond to emergencies  [facility] immediately available    Physician(s)  Dr. Ree Kida    Location  [facility]    Staff Present  Su Hilt, MS, ACSM RCEP, Exercise Physiologist;Annedrea Rosezella Florida, RN, MHA;Lisa Ysidro Evert, RN;Dalton Fletcher, MS, Exercise Physiologist    Medication changes reported      No    Fall or balance concerns reported     No    Tobacco Cessation  No Change    Warm-up and Cool-down  Performed as group-led instruction    Resistance Training Performed  Yes    VAD Patient?  No    PAD/SET Patient?  No      Pain Assessment   Currently in Pain?  No/denies    Multiple Pain Sites  No       Capillary Blood Glucose: No results found for this or any previous visit (from the past 24 hour(s)).    Social History   Tobacco Use  Smoking Status Former Smoker  . Packs/day: 1.00  . Years: 8.00  . Pack years: 8.00  . Types: Cigarettes  . Last attempt to quit: 11/21/1983  . Years since quitting: 34.8  Smokeless Tobacco Never Used    Goals Met:  Proper associated with RPD/PD & O2 Sat Exercise tolerated well No report of cardiac concerns or symptoms Strength training completed today  Goals Unmet:  Not Applicable  Comments: Service time is from 1030 to 1205    Dr. Rush Farmer is Medical Director for Pulmonary Rehab at [facility].

## 2018-09-10 ENCOUNTER — Encounter (HOSPITAL_COMMUNITY)
Admission: RE | Admit: 2018-09-10 | Discharge: 2018-09-10 | Disposition: A | Discharge status: Home / Self Care | Payer: Medicare Other | Source: Ambulatory Visit | Attending: Pulmonary Disease | Admitting: Pulmonary Disease

## 2018-09-10 DIAGNOSIS — J432 Centrilobular emphysema: Secondary | ICD-10-CM | POA: Diagnosis not present

## 2018-09-10 NOTE — Progress Notes (Signed)
Daily Session Note  Patient Details  Name: [name] MRN: [id number] Date of Birth: [birth year] Referring Provider:     Pulmonary Rehab Walk Test from 07/02/2018 in [place]  Referring Provider  Dr. Nelda Marseille      Encounter Date: 09/10/2018  Check In: Session Check In - 09/10/18 1025      Check-In   Supervising physician immediately available to respond to emergencies  [facility] immediately available    Physician(s)  Dr. Ree Kida    Location  [facility]    Staff Present  [name], RN;Dalton Kris Mouton, MS, Exercise Physiologist;Annedrea Rosezella Florida, RN, MHA;Molly DiVincenzo, MS, ACSM RCEP, Exercise Physiologist    Medication changes reported      No    Fall or balance concerns reported     No    Tobacco Cessation  No Change    Warm-up and Cool-down  Performed as group-led instruction    Resistance Training Performed  Yes    VAD Patient?  No    PAD/SET Patient?  No      Pain Assessment   Currently in Pain?  No/denies    Multiple Pain Sites  No       Capillary Blood Glucose: No results found for this or any previous visit (from the past 24 hour(s)).    Social History   Tobacco Use  Smoking Status Former Smoker  . Packs/day: 1.00  . Years: 8.00  . Pack years: 8.00  . Types: Cigarettes  . Last attempt to quit: 11/21/1983  . Years since quitting: 34.8  Smokeless Tobacco Never Used    Goals Met:  Exercise tolerated well No report of cardiac concerns or symptoms Strength training completed today  Goals Unmet:  Not Applicable  Comments: Service time is from 1030 to 1200    Dr. Rush Farmer is Medical Director for Pulmonary Rehab at [facility].

## 2018-09-12 ENCOUNTER — Encounter (HOSPITAL_COMMUNITY)
Admission: RE | Admit: 2018-09-12 | Discharge: 2018-09-12 | Disposition: A | Discharge status: Home / Self Care | Payer: Medicare Other | Source: Ambulatory Visit | Attending: Pulmonary Disease | Admitting: Pulmonary Disease

## 2018-09-12 DIAGNOSIS — J432 Centrilobular emphysema: Secondary | ICD-10-CM | POA: Diagnosis not present

## 2018-09-12 NOTE — Progress Notes (Signed)
Daily Session Note  Patient Details  Name: [name] MRN: [id number] Date of Birth: August 01, 1941 Referring Provider:     Pulmonary Rehab Walk Test from 07/02/2018 in [place]  Referring Provider  Dr. Nelda Marseille      Encounter Date: 09/12/2018  Check In: Session Check In - 09/12/18 1101      Check-In   Supervising physician immediately available to respond to emergencies  [facility] immediately available    Physician(s)  Dr. Tyrell Antonio    Location  [facility]    Staff Present  [name], RN;Dalton Kris Mouton, MS, Exercise Physiologist;Annedrea Rosezella Florida, RN, MHA;Molly DiVincenzo, MS, ACSM RCEP, Exercise Physiologist;Carlette Wilber Oliphant, RN, BSN    Medication changes reported      No    Fall or balance concerns reported     No    Tobacco Cessation  No Change    Warm-up and Cool-down  Performed as group-led instruction    Resistance Training Performed  Yes    VAD Patient?  No    PAD/SET Patient?  No      Pain Assessment   Currently in Pain?  No/denies    Multiple Pain Sites  No       Capillary Blood Glucose: No results found for this or any previous visit (from the past 24 hour(s)).    Social History   Tobacco Use  Smoking Status Former Smoker  . Packs/day: 1.00  . Years: 8.00  . Pack years: 8.00  . Types: Cigarettes  . Last attempt to quit: 11/21/1983  . Years since quitting: 34.8  Smokeless Tobacco Never Used    Goals Met:  Proper associated with RPD/PD & O2 Sat Exercise tolerated well  Goals Unmet:  Not Applicable  Comments: Service time is from 1030 to 1230    Dr. Rush Farmer is Medical Director for Pulmonary Rehab at [facility].

## 2018-09-16 ENCOUNTER — Ambulatory Visit (INDEPENDENT_AMBULATORY_CARE_PROVIDER_SITE_OTHER): Payer: Medicare Other | Admitting: Endocrinology

## 2018-09-16 ENCOUNTER — Encounter: Payer: Self-pay | Admitting: Endocrinology

## 2018-09-16 VITALS — BP 154/62 | HR 93 | Ht 63.0 in | Wt 153.8 lb

## 2018-09-16 DIAGNOSIS — E1122 Type 2 diabetes mellitus with diabetic chronic kidney disease: Secondary | ICD-10-CM

## 2018-09-16 DIAGNOSIS — Z794 Long term (current) use of insulin: Secondary | ICD-10-CM | POA: Diagnosis not present

## 2018-09-16 DIAGNOSIS — N183 Chronic kidney disease, stage 3 unspecified: Secondary | ICD-10-CM

## 2018-09-16 DIAGNOSIS — I2510 Atherosclerotic heart disease of native coronary artery without angina pectoris: Secondary | ICD-10-CM

## 2018-09-16 LAB — POCT GLYCOSYLATED HEMOGLOBIN (HGB A1C): Hemoglobin A1C: 5.8 % — AB (ref 4.0–5.6)

## 2018-09-16 MED ORDER — GLIPIZIDE 5 MG PO TABS: ORAL_TABLET | ORAL | 1 refills | Status: DC

## 2018-09-16 NOTE — Patient Instructions (Signed)
check your blood sugar once a day.  vary the time of day when you check, between before the 3 meals, and at bedtime.  also check if you have symptoms of your blood sugar being too high or too low.  please keep a record of the readings and bring it to your next appointment here (or you can bring the meter itself).  You can write it on any piece of paper.  please call us sooner if your blood sugar goes below 70, or if you have a lot of readings over 200.   Please reduce the glipizide to 1/4 pill per day.   Please come back for a follow-up appointment in 3 months.

## 2018-09-16 NOTE — Progress Notes (Signed)
Subjective:    Patient ID: [name], female    DOB: [birth year], 77 y.o.   MRN: [id number]  HPI Pt returns for f/u of diabetes mellitus: DM type: 2 Dx'ed: 5916 Complications: CAD and renal failure (transplant 2013) Therapy: glipizide GDM: never DKA: never Severe hypoglycemia: never Pancreatitis: never Pancreatic imaging: normal on 2015 CT Other: she has never been on insulin; she intermittently takes steroids for COPD; she requests generic meds Interval history: she brings a record of her cbg's which I have reviewed today.  She checks fasting.  All are approx 100.  She still takes glipizide, 2.5 mg qd.  pt states she feels well in general.   Past Medical History:  Diagnosis Date  . 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 Asthma   . Coronary artery disease    cath 11/2007, grafts patent /  Nuclear, June, 2011, prior inferior MI with mild peri-infarct ischemia, anterior breast attenuation, EF 67%, done for renal transplant assessment / Low level exercise/Lexiscan Myoview (11/2013): EF 67%, no ischemi; normal study  . Diabetes mellitus    type 2  . ESRD on dialysis [facility])    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, 2012  . GERD (gastroesophageal reflux disease)   . GI bleed 11/26/2010   January, 2012 , AVM  . Gout   . History of methicillin resistant staphylococcus aureus (MRSA)   . Hyperlipidemia   . Hyperparathyroidism   . Hypertension   . Myocardial infarction ([place])   . Osteoporosis   . Pneumonia 08/2010    Hospitalization, October, 2011  . Primary osteoarthritis, 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  . S/P kidney transplant    September, 2012, [facility]  . Subacromial impingement of 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    Past Surgical History:  Procedure Laterality Date  . ABDOMINAL HYSTERECTOMY  1980'S  . ARTERIOVENOUS GRAFT PLACEMENT Left 03/04/2007   forearm  . BREAST EXCISIONAL BIOPSY    . BREAST SURGERY  YRS AGO   RT  BREAST CYST REMOVED   . CARDIAC CATHETERIZATION  06/03/2002; 12/04/2007  . COLONOSCOPY WITH PROPOFOL N/A 07/14/2014   Procedure: COLONOSCOPY WITH PROPOFOL;  Surgeon: Lear Ng, MD;  Location: [facility];  Service: Endoscopy;  Laterality: N/A;  . CORONARY ARTERY BYPASS GRAFT  06/06/2002   x 4  . HOT HEMOSTASIS N/A 07/14/2014   Procedure: HOT HEMOSTASIS (ARGON PLASMA COAGULATION/BICAP);  Surgeon: Lear Ng, MD;  Location: [facility];  Service: Endoscopy;  Laterality: N/A;  . KIDNEY TRANSPLANT Right 08/04/2011  . ORIF PATELLA Left 04/06/2016   Procedure: OPEN REDUCTION INTERNAL (ORIF) LEFT  PATELLA;  Surgeon: Ninetta Lights, MD;  Location: [place];  Service: Orthopedics;  Laterality: Left;  . SHOULDER ARTHROSCOPY WITH ROTATOR CUFF REPAIR AND SUBACROMIAL DECOMPRESSION Left 08/03/2016   Procedure: LEFT SHOULDER ARTHROSCOPY WITH ROTATOR CUFF REPAIR AND SUBACROMIAL DECOMPRESSION with distal claviculectomy and extentsive debridement;  Surgeon: Ninetta Lights, MD;  Location: [place];  Service: Orthopedics;  Laterality: Left;  Block  . THROMBECTOMY AND REVISION OF ARTERIOVENTOUS (AV) GORETEX  GRAFT Left 05/08/2007   forearm  . TOTAL HIP ARTHROPLASTY  12/18/2012   Procedure: TOTAL HIP ARTHROPLASTY;  Surgeon: Ninetta Lights, MD;  Location: [place];  Service: Orthopedics;  Laterality: Left;  Marland Kitchen VESICOVAGINAL FISTULA CLOSURE W/ TAH  1984    Social History   Socioeconomic History  . Marital status: Divorced  Spouse name: Not on file  . Number of children: Not on file  . Years of education: Not on file  . Highest education level: Not on file  Occupational History  . Not on file  Social Needs  . Financial resource strain: Not on file  . Food insecurity:    Worry: Not on file    Inability: Not on file  . Transportation needs:    Medical: Not on file    Non-medical: Not on file  Tobacco Use  . Smoking status: Former Smoker    Packs/day: 1.00     Years: 8.00    Pack years: 8.00    Types: Cigarettes    Last attempt to quit: 11/21/1983    Years since quitting: 34.8  . Smokeless tobacco: Never Used  Substance and Sexual Activity  . Alcohol use: No  . Drug use: No  . Sexual activity: Not on file  Lifestyle  . Physical activity:    Days per week: Not on file    Minutes per session: Not on file  . Stress: Not on file  Relationships  . Social connections:    Talks on phone: Not on file    Gets together: Not on file    Attends religious service: Not on file    Active member of club or organization: Not on file    Attends meetings of clubs or organizations: Not on file    Relationship status: Not on file  . Intimate partner violence:    Fear of current or ex partner: Not on file    Emotionally abused: Not on file    Physically abused: Not on file    Forced sexual activity: Not on file  Other Topics Concern  . Not on file  Social History Narrative  . Not on file    Current Outpatient Medications on File Prior to Visit  Medication Sig Dispense Refill  . amLODipine (NORVASC) 10 MG tablet Take 10 mg by mouth every morning.     Marland Kitchen aspirin EC 81 MG tablet Take 81 mg by mouth daily.    Marland Kitchen azaTHIOprine (IMURAN) 50 MG tablet Take 50 mg by mouth daily.    . Azelastine HCl 0.15 % SOLN Place 1 spray into the nose 2 (two) times daily.    . calcitRIOL (ROCALTROL) 0.25 MCG capsule Take 0.25 mcg by mouth daily.     . ferrous sulfate 325 (65 FE) MG tablet Take 325 mg by mouth daily with breakfast.    . fluticasone (FLONASE) 50 MCG/ACT nasal spray Place 1 spray into both nostrils 2 (two) times daily.    . fluticasone furoate-vilanterol (BREO ELLIPTA) 200-25 MCG/INH AEPB Inhale 1 puff into the lungs daily.    . nitroGLYCERIN (NITROSTAT) 0.4 MG SL tablet Place 1 tablet (0.4 mg total) under the tongue every 5 (five) minutes as needed for chest pain. 25 tablet 1  . omeprazole (PRILOSEC) 20 MG capsule Take 20 mg by mouth daily.     . predniSONE  (DELTASONE) 5 MG tablet Take 1 tablet (5 mg total) by mouth daily.    . sennosides-docusate sodium (SENOKOT-S) 8.6-50 MG tablet Take 1 tablet by mouth daily.    . tacrolimus (PROGRAF) 1 MG capsule Take 1 mg by mouth 2 (two) times daily. Pt takes 5 capsules (5mg ) in the morning at 9 am and 6 capsules(6mg ) at 9 pm    . Tiotropium Bromide Monohydrate (SPIRIVA RESPIMAT) 1.25 MCG/ACT AERS Inhale 1 puff into the lungs daily.    Marland Kitchen  triamterene-hydrochlorothiazide (MAXZIDE-25) 37.5-25 MG tablet Take 1 tablet by mouth daily. 30 tablet 0  . rosuvastatin (CRESTOR) 10 MG tablet Take 1 tablet (10 mg total) by mouth daily. 90 tablet 3   No current facility-administered medications on file prior to visit.     Allergies  Allergen Reactions  . Lactose Other (See Comments)  . Asa Arthritis Strength-Antacid [Aspirin Buffered] Other (See Comments)    STOMACH BURNS, N/V   . Aspirin Nausea And Vomiting    Per pt. "can tolerate the enteric coated tablets".   . Atorvastatin Other (See Comments)    Patient's skin was skin was sensitive  . Banana Nausea And Vomiting  . Lactose Intolerance (Gi) Nausea And Vomiting  . Lisinopril Cough    Family History  Problem Relation Age of Onset  . Breast cancer Sister   . Cancer Neg Hx     BP (!) 154/62 (BP Location: Right Arm, Patient Position: Sitting, Cuff Size: Normal)   Pulse 93   Ht 5\' 3"  (1.6 m)   Wt 153 lb 12.8 oz (69.8 kg)   SpO2 90% Comment: on 2L O2  BMI 27.24 kg/m    Review of Systems She denies hypoglycemia.      Objective:   Physical Exam VITAL SIGNS:  See vs page GENERAL: no distress.  Has 02 on Pulses: dorsalis pedis intact bilat.   MSK: no deformity of the feet CV: no leg edema Skin:  no ulcer on the feet.  normal color and temp on the feet. Neuro: sensation is intact to touch on the feet    Lab Results  Component Value Date   HGBA1C 5.8 (A) 09/16/2018   Lab Results  Component Value Date   CREATININE 1.05 (H) 06/05/2018   BUN 14  06/05/2018   NA 143 06/05/2018   K 3.5 06/05/2018   CL 102 06/05/2018   CO2 25 06/05/2018       Assessment & Plan:  Type 2 DM, with CAD: overcontrolled  Patient Instructions  check your blood sugar once a day.  vary the time of day when you check, between before the 3 meals, and at bedtime.  also check if you have symptoms of your blood sugar being too high or too low.  please keep a record of the readings and bring it to your next appointment here (or you can bring the meter itself).  You can write it on any piece of paper.  please call us sooner if your blood sugar goes below 70, or if you have a lot of readings over 200.   Please reduce the glipizide to 1/4 pill per day.   Please come back for a follow-up appointment in 3 months.

## 2018-09-17 ENCOUNTER — Encounter (HOSPITAL_COMMUNITY)
Admission: RE | Admit: 2018-09-17 | Discharge: 2018-09-17 | Disposition: A | Discharge status: Home / Self Care | Payer: Medicare Other | Source: Ambulatory Visit | Attending: Pulmonary Disease | Admitting: Pulmonary Disease

## 2018-09-17 VITALS — Wt 149.9 lb

## 2018-09-17 DIAGNOSIS — J432 Centrilobular emphysema: Secondary | ICD-10-CM | POA: Diagnosis not present

## 2018-09-17 NOTE — Progress Notes (Signed)
Daily Session Note  Patient Details  Name: [name] MRN: [id number] Date of Birth: Mar 24, 1941 Referring Provider:     Pulmonary Rehab Walk Test from 07/02/2018 in [place]  Referring Provider  Dr. Nelda Marseille      Encounter Date: 09/17/2018  Check In: Session Check In - 09/17/18 1206      Check-In   Supervising physician immediately available to respond to emergencies  [facility] immediately available    Physician(s)  Dr. Tyrell Antonio    Location  [facility]    Staff Present  [name], RN;Dalton Kris Mouton, MS, Exercise Physiologist;Annedrea Rosezella Florida, RN, MHA;Molly DiVincenzo, MS, ACSM RCEP, Exercise Physiologist;Carlette Wilber Oliphant, RN, BSN    Medication changes reported      No    Fall or balance concerns reported     No    Tobacco Cessation  No Change    Warm-up and Cool-down  Performed as group-led instruction    Resistance Training Performed  Yes    VAD Patient?  No    PAD/SET Patient?  No      Pain Assessment   Currently in Pain?  No/denies    Multiple Pain Sites  No       Capillary Blood Glucose: No results found for this or any previous visit (from the past 24 hour(s)). POCT Glucose - 09/17/18 1211      POCT Blood Glucose   Pre-Exercise  139 mg/dL    Post-Exercise  139 mg/dL      Exercise Prescription Changes - 09/17/18 1200      Response to Exercise   Blood Pressure (Admit)  140/64    Blood Pressure (Exercise)  120/70    Blood Pressure (Exit)  126/60    Heart Rate (Admit)  86 bpm    Heart Rate (Exercise)  103 bpm    Heart Rate (Exit)  81 bpm    Oxygen Saturation (Admit)  94 %    Oxygen Saturation (Exercise)  94 %    Oxygen Saturation (Exit)  94 %    Rating of Perceived Exertion (Exercise)  11    Perceived Dyspnea (Exercise)  1    Duration  Progress to 45 minutes of aerobic exercise without signs/symptoms of physical distress    Intensity  THRR unchanged      Progression   Progression   Continue to progress workloads to maintain intensity without signs/symptoms of physical distress.      Resistance Training   Training Prescription  Yes    Weight  blue bands    Reps  10-15    Time  10 Minutes      Oxygen   Oxygen  Continuous    Liters  2      Recumbant Bike   Level  3    Watts  10    Minutes  17      NuStep   Level  5    SPM  80    Minutes  17    METs  2.2      Track   Laps  9    Minutes  17       Social History   Tobacco Use  Smoking Status Former Smoker  . Packs/day: 1.00  . Years: 8.00  . Pack years: 8.00  . Types: Cigarettes  . Last attempt to quit: 11/21/1983  . Years since quitting: 34.8  Smokeless Tobacco Never Used    Goals Met:  Exercise tolerated well  No report of cardiac concerns or symptoms Strength training completed today  Goals Unmet:  Not Applicable  Comments: Service time is from 1030 to 1200    Dr. Rush Farmer is Medical Director for Pulmonary Rehab at [facility].

## 2018-09-19 ENCOUNTER — Encounter (HOSPITAL_COMMUNITY)
Admission: RE | Admit: 2018-09-19 | Discharge: 2018-09-19 | Disposition: A | Discharge status: Home / Self Care | Payer: Medicare Other | Source: Ambulatory Visit | Attending: Pulmonary Disease | Admitting: Pulmonary Disease

## 2018-09-19 DIAGNOSIS — J432 Centrilobular emphysema: Secondary | ICD-10-CM

## 2018-09-19 NOTE — Progress Notes (Signed)
Daily Session Note  Patient Details  Name: [name] MRN: [id number] Date of Birth: May 23, 1941 Referring Provider:     Pulmonary Rehab Walk Test from 07/02/2018 in [place]  Referring Provider  Dr. Nelda Marseille      Encounter Date: 09/19/2018  Check In: Session Check In - 09/19/18 1044      Check-In   Supervising physician immediately available to respond to emergencies  [facility] immediately available    Physician(s)  Dr. Algis Liming    Location  [facility]    Staff Present  [name], RN;Dalton Kris Mouton, MS, Exercise Physiologist;Annedrea Rosezella [place], RN, MHA;Molly DiVincenzo, MS, ACSM RCEP, Exercise Physiologist;Carlette Wilber Oliphant, RN, BSN    Medication changes reported      No    Fall or balance concerns reported     No    Tobacco Cessation  No Change    Warm-up and Cool-down  Performed as group-led instruction    Resistance Training Performed  Yes    VAD Patient?  No    PAD/SET Patient?  No      Pain Assessment   Currently in Pain?  No/denies    Multiple Pain Sites  No       Capillary Blood Glucose: No results found for this or any previous visit (from the past 24 hour(s)).    Social History   Tobacco Use  Smoking Status Former Smoker  . Packs/day: 1.00  . Years: 8.00  . Pack years: 8.00  . Types: Cigarettes  . Last attempt to quit: 11/21/1983  . Years since quitting: 34.8  Smokeless Tobacco Never Used    Goals Met:  Proper associated with RPD/PD & O2 Sat Exercise tolerated well  Goals Unmet:  Not Applicable  Comments: Service time is from 1030 to 1200    Dr. Rush Farmer is Medical Director for Pulmonary Rehab at [facility].

## 2018-09-23 ENCOUNTER — Telehealth: Payer: Self-pay | Admitting: Licensed Clinical Social Worker

## 2018-09-23 ENCOUNTER — Encounter: Payer: Self-pay | Admitting: Licensed Clinical Social Worker

## 2018-09-23 NOTE — Telephone Encounter (Signed)
Received a genetic counseling referral from Dr. [name] for fhx of lung cancer. Pt has been scheduled to see Faith Rogue on 11/25 at 2pm. Letter mailed to the pt.

## 2018-09-24 ENCOUNTER — Encounter (HOSPITAL_COMMUNITY)
Admission: RE | Admit: 2018-09-24 | Discharge: 2018-09-24 | Disposition: A | Discharge status: Home / Self Care | Payer: Medicare Other | Source: Ambulatory Visit | Attending: Pulmonary Disease | Admitting: Pulmonary Disease

## 2018-09-24 DIAGNOSIS — J432 Centrilobular emphysema: Secondary | ICD-10-CM | POA: Diagnosis not present

## 2018-09-24 NOTE — Progress Notes (Signed)
Daily Session Note  Patient Details  Name: RAFEEF LAU MRN: [id number] Date of Birth: 1941/02/08 Referring Provider:     Pulmonary Rehab Walk Test from 07/02/2018 in [place]  Referring Provider  Dr. Nelda Marseille      Encounter Date: 09/24/2018  Check In:   Capillary Blood Glucose: No results found for this or any previous visit (from the past 24 hour(s)).    Social History   Tobacco Use  Smoking Status Former Smoker  . Packs/day: 1.00  . Years: 8.00  . Pack years: 8.00  . Types: Cigarettes  . Last attempt to quit: 11/21/1983  . Years since quitting: 34.8  Smokeless Tobacco Never Used    Goals Met:  Proper associated with RPD/PD & O2 Sat Exercise tolerated well  Goals Unmet:  Not Applicable  Comments: Service time is from 1030 to 1210    Dr. Rush Farmer is Medical Director for Pulmonary Rehab at [facility].

## 2018-09-26 ENCOUNTER — Encounter (HOSPITAL_COMMUNITY)
Admission: RE | Admit: 2018-09-26 | Discharge: 2018-09-26 | Disposition: A | Discharge status: Home / Self Care | Payer: Medicare Other | Source: Ambulatory Visit | Attending: Pulmonary Disease | Admitting: Pulmonary Disease

## 2018-09-26 DIAGNOSIS — J432 Centrilobular emphysema: Secondary | ICD-10-CM

## 2018-09-27 DIAGNOSIS — H5212 Myopia, left eye: Secondary | ICD-10-CM | POA: Diagnosis not present

## 2018-09-27 DIAGNOSIS — H5201 Hypermetropia, right eye: Secondary | ICD-10-CM | POA: Diagnosis not present

## 2018-09-27 DIAGNOSIS — H52203 Unspecified astigmatism, bilateral: Secondary | ICD-10-CM | POA: Diagnosis not present

## 2018-09-27 DIAGNOSIS — Z961 Presence of intraocular lens: Secondary | ICD-10-CM | POA: Diagnosis not present

## 2018-09-27 DIAGNOSIS — Z7984 Long term (current) use of oral hypoglycemic drugs: Secondary | ICD-10-CM | POA: Diagnosis not present

## 2018-09-27 DIAGNOSIS — E119 Type 2 diabetes mellitus without complications: Secondary | ICD-10-CM | POA: Diagnosis not present

## 2018-09-27 DIAGNOSIS — H524 Presbyopia: Secondary | ICD-10-CM | POA: Diagnosis not present

## 2018-10-01 ENCOUNTER — Encounter (HOSPITAL_COMMUNITY): Payer: Medicare Other

## 2018-10-02 DIAGNOSIS — I10 Essential (primary) hypertension: Secondary | ICD-10-CM | POA: Diagnosis not present

## 2018-10-02 DIAGNOSIS — Z940 Kidney transplant status: Secondary | ICD-10-CM | POA: Diagnosis not present

## 2018-10-02 DIAGNOSIS — I2510 Atherosclerotic heart disease of native coronary artery without angina pectoris: Secondary | ICD-10-CM | POA: Diagnosis not present

## 2018-10-02 DIAGNOSIS — E119 Type 2 diabetes mellitus without complications: Secondary | ICD-10-CM | POA: Diagnosis not present

## 2018-10-02 DIAGNOSIS — Z8639 Personal history of other endocrine, nutritional and metabolic disease: Secondary | ICD-10-CM | POA: Diagnosis not present

## 2018-10-02 DIAGNOSIS — Z7982 Long term (current) use of aspirin: Secondary | ICD-10-CM | POA: Diagnosis not present

## 2018-10-02 DIAGNOSIS — J449 Chronic obstructive pulmonary disease, unspecified: Secondary | ICD-10-CM | POA: Diagnosis not present

## 2018-10-02 DIAGNOSIS — Z79899 Other long term (current) drug therapy: Secondary | ICD-10-CM | POA: Diagnosis not present

## 2018-10-02 DIAGNOSIS — Z4822 Encounter for aftercare following kidney transplant: Secondary | ICD-10-CM | POA: Diagnosis not present

## 2018-10-02 DIAGNOSIS — Z23 Encounter for immunization: Secondary | ICD-10-CM | POA: Diagnosis not present

## 2018-10-02 DIAGNOSIS — Z7984 Long term (current) use of oral hypoglycemic drugs: Secondary | ICD-10-CM | POA: Diagnosis not present

## 2018-10-03 ENCOUNTER — Encounter (HOSPITAL_COMMUNITY): Payer: Medicare Other

## 2018-10-08 ENCOUNTER — Encounter (HOSPITAL_COMMUNITY): Payer: Medicare Other

## 2018-10-10 ENCOUNTER — Encounter (HOSPITAL_COMMUNITY): Payer: Medicare Other

## 2018-10-14 ENCOUNTER — Inpatient Hospital Stay: Payer: Medicare Other | Attending: Genetic Counselor | Admitting: Licensed Clinical Social Worker

## 2018-10-14 ENCOUNTER — Encounter: Payer: Self-pay | Admitting: Licensed Clinical Social Worker

## 2018-10-14 ENCOUNTER — Inpatient Hospital Stay: Payer: Medicare Other

## 2018-10-14 DIAGNOSIS — Z803 Family history of malignant neoplasm of breast: Secondary | ICD-10-CM

## 2018-10-14 DIAGNOSIS — Z801 Family history of malignant neoplasm of trachea, bronchus and lung: Secondary | ICD-10-CM

## 2018-10-14 DIAGNOSIS — Z8042 Family history of malignant neoplasm of prostate: Secondary | ICD-10-CM | POA: Diagnosis not present

## 2018-10-14 NOTE — Progress Notes (Signed)
REFERRING PROVIDER: Renato Shin, MD [address] [place], [place] [address]  PRIMARY PROVIDER:  Renato Shin, MD  PRIMARY REASON FOR VISIT:  1. Family history of breast cancer   2. Family history of prostate cancer   3. Family history of lung cancer     HISTORY OF PRESENT ILLNESS:   [name], a 77 y.o. female, was seen for a  cancer genetics consultation at the request of Dr. [name] due to a family history of cancer.  [name] presents to clinic today to discuss the possibility of a hereditary predisposition to cancer, genetic testing, and to further clarify her future cancer risks, as well as potential cancer risks for family members.    [name] is a 77 y.o. female with no personal history of cancer.    HORMONAL RISK FACTORS:  Menarche was at age 72.  First live birth at age 20.  OCP use for approximately 0 years.  Ovaries intact: yes.  Hysterectomy: yes.  Menopausal status: postmenopausal.  HRT use: 0 years. Colonoscopy: yes; 8 cumulative adenomatous polyps. Mammogram within the last year: yes. Number of breast biopsies: 1.  Past Medical History:  Diagnosis Date  . 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 Asthma   . Coronary artery disease    cath 11/2007, grafts patent /  Nuclear, June, 2011, prior inferior MI with mild peri-infarct ischemia, anterior breast attenuation, EF 67%, done for renal transplant assessment / Low level exercise/Lexiscan Myoview (11/2013): EF 67%, no ischemi; normal study  . Diabetes mellitus    type 2  . ESRD on dialysis [facility])    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, 2012  . Family history of breast cancer   . Family history of prostate cancer   . GERD (gastroesophageal reflux disease)   . GI bleed 11/26/2010   January, 2012 , AVM  . Gout   . History of methicillin resistant staphylococcus aureus (MRSA)   . Hyperlipidemia   .  Hyperparathyroidism   . Hypertension   . Myocardial infarction ([place])   . Osteoporosis   . Pneumonia 08/2010    Hospitalization, October, 2011  . Primary osteoarthritis, 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  . S/P kidney transplant    September, 2012, [facility]  . Subacromial impingement of 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    Past Surgical History:  Procedure Laterality Date  . ABDOMINAL HYSTERECTOMY  1980'S  . ARTERIOVENOUS GRAFT PLACEMENT Left 03/04/2007   forearm  . BREAST EXCISIONAL BIOPSY    . BREAST SURGERY  YRS AGO   RT BREAST CYST REMOVED   . CARDIAC CATHETERIZATION  06/03/2002; 12/04/2007  . COLONOSCOPY WITH PROPOFOL N/A 07/14/2014   Procedure: COLONOSCOPY WITH PROPOFOL;  Surgeon: Lear Ng, MD;  Location: [facility];  Service: Endoscopy;  Laterality: N/A;  . CORONARY ARTERY BYPASS GRAFT  06/06/2002   x 4  . HOT HEMOSTASIS N/A 07/14/2014   Procedure: HOT HEMOSTASIS (ARGON PLASMA COAGULATION/BICAP);  Surgeon: Lear Ng, MD;  Location: [facility];  Service: Endoscopy;  Laterality: N/A;  . KIDNEY TRANSPLANT Right 08/04/2011  . ORIF PATELLA Left 04/06/2016   Procedure: OPEN REDUCTION INTERNAL (ORIF) LEFT  PATELLA;  Surgeon: Ninetta Lights, MD;  Location: [facility];  Service: Orthopedics;  Laterality: Left;  . SHOULDER ARTHROSCOPY WITH ROTATOR CUFF REPAIR AND SUBACROMIAL DECOMPRESSION Left 08/03/2016   Procedure: LEFT SHOULDER ARTHROSCOPY WITH ROTATOR CUFF REPAIR AND  SUBACROMIAL DECOMPRESSION with distal claviculectomy and extentsive debridement;  Surgeon: Ninetta Lights, MD;  Location: [facility];  Service: Orthopedics;  Laterality: Left;  Block  . THROMBECTOMY AND REVISION OF ARTERIOVENTOUS (AV) GORETEX  GRAFT Left 05/08/2007   forearm  . TOTAL HIP ARTHROPLASTY  12/18/2012   Procedure: TOTAL HIP ARTHROPLASTY;  Surgeon: Ninetta Lights, MD;  Location: [facility];  Service: Orthopedics;  Laterality: Left;  Marland Kitchen VESICOVAGINAL FISTULA CLOSURE W/ TAH  1984     Social History   Socioeconomic History  . Marital status: Divorced    Spouse name: Not on file  . Number of children: Not on file  . Years of education: Not on file  . Highest education level: Not on file  Occupational History  . Not on file  Social Needs  . Financial resource strain: Not on file  . Food insecurity:    Worry: Not on file    Inability: Not on file  . Transportation needs:    Medical: Not on file    Non-medical: Not on file  Tobacco Use  . Smoking status: Former Smoker    Packs/day: 1.00    Years: 8.00    Pack years: 8.00    Types: Cigarettes    Last attempt to quit: 11/21/1983    Years since quitting: 34.9  . Smokeless tobacco: Never Used  Substance and Sexual Activity  . Alcohol use: No  . Drug use: No  . Sexual activity: Not on file  Lifestyle  . Physical activity:    Days per week: Not on file    Minutes per session: Not on file  . Stress: Not on file  Relationships  . Social connections:    Talks on phone: Not on file    Gets together: Not on file    Attends religious service: Not on file    Active member of club or organization: Not on file    Attends meetings of clubs or organizations: Not on file    Relationship status: Not on file  Other Topics Concern  . Not on file  Social History Narrative  . Not on file     FAMILY HISTORY:  We obtained a detailed, 4-generation family history.  Significant diagnoses are listed below: Family History  Problem Relation Age of Onset  . Breast cancer Sister 67  . Prostate cancer Brother   . Lung cancer Brother   . Cancer Neg Hx     [name] had four children. One of her sons passed away at age 36. Her other sons are 75, and 75, and her daughter is 31, no cancer history. [name] has four half-sisters and a half-brother through her mom. One of these half-sisters had breast cancer diagnosed at 59, and is 73. She had bilateral mastectomies. [name] believes she may have had positive genetic  testing. [name] also had two half-brothers through her dad. One had prostate cancer and died at 84 and the other had lung cancer and died at 75.  [name] mother died at 33 of renal failure. She had 21 total brothers and sisters, some were half-brothers/half-sisters. They have all passed away. No cancers as far as the patient knows. No cancers in her maternal cousins as far as she knows. [name] maternal grandfather died at 32 and maternal grandmother died in her 67's.   [name] father died in his 30's. He had three brothers, no cancers. No cancers in paternal cousins. Her paternal grandmother  and grandfather both died in their 2's.  [name] is unaware of previous family history of genetic testing for hereditary cancer risks. Patient's maternal ancestors are of African American descent, and paternal ancestors are of African American descent. There is no reported Ashkenazi Jewish ancestry. There is no known consanguinity.  GENETIC COUNSELING ASSESSMENT: [name] is a 77 y.o. female with a family history which is somewhat suggestive of a Hereditary Cancer Predisposition Syndrome. We, therefore, discussed and recommended the following at today's visit.   DISCUSSION: We discussed that about 5-10% of breast cancer cases are hereditary with most cases due to BRCA mutations.  Other genes associated with hereditary breast cancer cases include ATM, CHEK2 and PALB2.  We reviewed the characteristics, features and inheritance patterns of hereditary cancer syndromes. We also discussed genetic testing, including the appropriate family members to test, the process of testing, insurance coverage and turn-around-time for results. We discussed the implications of a negative, positive and/or variant of uncertain significant result.   We discussed that if she is found to have a mutation in one of these genes, it may impact future medical management recommendations such as increased cancer  screenings and consideration of risk reducing surgeries.  A positive result could also have implications for the patient's family members.  A Negative result would mean we were unable to identify a hereditary component to her family history of cancer but does not rule out the possibility of a hereditary basis for her family history of cancer.  There could be mutations that are undetectable by current technology, or in genes not yet tested or identified to increase cancer risk.    We discussed the potential to find a Variant of Uncertain Significance or VUS.  These are variants that have not yet been identified as pathogenic or benign, and it is unknown if this variant is associated with increased cancer risk or if this is a normal finding.  Most VUS's are reclassified to benign or likely benign.   It should not be used to make medical management decisions. With time, we suspect the lab will determine the significance of any VUS's identified if any.   Based on [name]'s family history of cancer, she does not meet NCCN medical criteria for genetic testing. Since [name] does not quite meet criteria based on her sister's breast cancer, we discussed that she could talk with her sister first and find out if she had positive genetic testing before doing the testing herself. She will call me later this week to discuss what she finds out.   PLAN:  [name] did not wish to pursue genetic testing at today's visit. We understand this decision, and remain available to coordinate genetic testing at any time in the future. We; therefore, recommend [name] continue to follow the cancer screening guidelines given by her primary healthcare provider.  Lastly, we encouraged [name] to remain in contact with cancer genetics annually so that we can continuously update the family history and inform her of any changes in cancer genetics and testing that may be of benefit for this family.   Ms.  Ward  questions were answered to her satisfaction today. Our contact information was provided should additional questions or concerns arise. Thank you for the referral and allowing [place] to share in the care of your patient.   Faith Rogue, MS Genetic Counselor [email or web address] Phone: [phone]  The patient was seen for a total of 35 minutes in face-to-face genetic counseling.

## 2018-10-16 DIAGNOSIS — Z940 Kidney transplant status: Secondary | ICD-10-CM | POA: Diagnosis not present

## 2018-10-16 DIAGNOSIS — Z9483 Pancreas transplant status: Secondary | ICD-10-CM | POA: Diagnosis not present

## 2018-10-27 ENCOUNTER — Telehealth: Payer: Self-pay | Admitting: Endocrinology

## 2018-10-27 NOTE — Telephone Encounter (Signed)
I need to form back, that requested genetic testing.

## 2018-10-27 NOTE — Telephone Encounter (Signed)
-----   Message from Eyvonne Mechanic sent at 10/21/2018  1:37 PM EST ----- Hi Dr. [name],  I saw [name] last week for genetic counseling for family history of lung cancer. She did not know why she had been referred, and after taking her family history she doesn't meet criteria for cancer genetic testing and only reported one family member with lung cancer. Her sister did have breast cancer but she called me and reported negative genetics. I saw that [facility] contacted your office about the genetics so I was wondering if you had any more information regarding this patient and what type of genetic testing was wanted?  Thanks so much, [place]

## 2018-10-28 NOTE — Telephone Encounter (Signed)
This would be on the form

## 2018-10-28 NOTE — Telephone Encounter (Signed)
Sorry, what I meant was that I sent the paper for scanning.

## 2018-10-28 NOTE — Telephone Encounter (Signed)
Please further discuss with Dr. [name].

## 2018-10-28 NOTE — Telephone Encounter (Signed)
After further review of this pt chart, I saw that you had stated that you would not complete this paperwork because it was not within your scope of practice. Not certain what additional information we can provide at this time to assist [name] re: additional genetic testing. Please advise

## 2018-10-28 NOTE — Telephone Encounter (Signed)
[name]:  We can't put our hands on the form we received, requesting genetic testing, so i'm glad she has declined the testing.  thank you.  Renato Shin, MD

## 2018-10-28 NOTE — Telephone Encounter (Signed)
Per phone note for 08/15/18, pt was referred out because you stated that you could not fill out her form for genetic testing due to it being outside of your scope of practice. [place] Ar stated that she saw pt last week at the cancer center that you referred pt to and wanted to know if you knew what type of testing pt wanted done? Please advise

## 2018-10-28 NOTE — Telephone Encounter (Signed)
I do apologize. I know you have mentioned a form. It seems it was lost. But I cannot locate any form in her chart. Do you know where it is?

## 2018-11-08 DIAGNOSIS — K210 Gastro-esophageal reflux disease with esophagitis: Secondary | ICD-10-CM | POA: Diagnosis not present

## 2018-11-08 DIAGNOSIS — I25810 Atherosclerosis of coronary artery bypass graft(s) without angina pectoris: Secondary | ICD-10-CM | POA: Diagnosis not present

## 2018-11-08 DIAGNOSIS — N2581 Secondary hyperparathyroidism of renal origin: Secondary | ICD-10-CM | POA: Diagnosis not present

## 2018-11-08 DIAGNOSIS — Z940 Kidney transplant status: Secondary | ICD-10-CM | POA: Diagnosis not present

## 2018-11-08 DIAGNOSIS — K31811 Angiodysplasia of stomach and duodenum with bleeding: Secondary | ICD-10-CM | POA: Diagnosis not present

## 2018-11-08 DIAGNOSIS — I129 Hypertensive chronic kidney disease with stage 1 through stage 4 chronic kidney disease, or unspecified chronic kidney disease: Secondary | ICD-10-CM | POA: Diagnosis not present

## 2018-11-08 DIAGNOSIS — M109 Gout, unspecified: Secondary | ICD-10-CM | POA: Diagnosis not present

## 2018-11-08 DIAGNOSIS — E1122 Type 2 diabetes mellitus with diabetic chronic kidney disease: Secondary | ICD-10-CM | POA: Diagnosis not present

## 2018-11-08 DIAGNOSIS — E785 Hyperlipidemia, unspecified: Secondary | ICD-10-CM | POA: Diagnosis not present

## 2018-11-08 DIAGNOSIS — N183 Chronic kidney disease, stage 3 (moderate): Secondary | ICD-10-CM | POA: Diagnosis not present

## 2018-11-08 DIAGNOSIS — D631 Anemia in chronic kidney disease: Secondary | ICD-10-CM | POA: Diagnosis not present

## 2018-11-11 DIAGNOSIS — J9611 Chronic respiratory failure with hypoxia: Secondary | ICD-10-CM | POA: Diagnosis not present

## 2018-11-11 DIAGNOSIS — Z940 Kidney transplant status: Secondary | ICD-10-CM | POA: Diagnosis not present

## 2018-11-11 DIAGNOSIS — J449 Chronic obstructive pulmonary disease, unspecified: Secondary | ICD-10-CM | POA: Diagnosis not present

## 2018-11-17 NOTE — Progress Notes (Signed)
Discharge Progress Report  Patient Details  Name: [name] MRN: [id number] Date of Birth: 21-Jan-1941 Referring Provider:     Pulmonary Rehab Walk Test from 07/02/2018 in [place]  Referring Provider  Dr. Nelda Marseille       Number of Visits: 24 exercise sessions and 11 education classes.  Reason for Discharge:  Patient reached a stable level of exercise. Patient independent in their exercise. Patient has met program and personal goals.  Smoking History:  Social History   Tobacco Use  Smoking Status Former Smoker  . Packs/day: 1.00  . Years: 8.00  . Pack years: 8.00  . Types: Cigarettes  . Last attempt to quit: 11/21/1983  . Years since quitting: 35.0  Smokeless Tobacco Never Used    Diagnosis:  Centrilobular emphysema ([place])  ADL [facility]: Pulmonary Assessment Scores    Row Name 07/04/18 0730 09/26/18 1249 09/26/18 1251     ADL [facility]   ADL Phase  Entry  Exit  Entry   SOB Score total  -  49  76     CAT Score   CAT Score  -  post 29  pre 30     mMRC Score   mMRC Score  3  -  -      Initial Exercise Prescription: Initial Exercise Prescription - 07/04/18 0700      Date of Initial Exercise RX and Referring Provider   Date  07/02/18    Referring Provider  Dr. Nelda Marseille      Oxygen   Oxygen  Continuous    Liters  2      Recumbant Bike   Level  2    Watts  10    Minutes  17      NuStep   Level  2    SPM  80    Minutes  17    METs  1.5      Track   Laps  7    Minutes  17      Prescription Details   Frequency (times per week)  2    Duration  Progress to 45 minutes of aerobic exercise without signs/symptoms of physical distress      Intensity   THRR 40-80% of Max Heartrate  58-116    Ratings of Perceived Exertion  11-13    Perceived Dyspnea  0-4      Progression   Progression  Continue progressive overload as per policy without signs/symptoms or physical distress.      Resistance Training   Training Prescription  Yes     Weight  BLUE BANDS    Reps  10-15       Discharge Exercise Prescription (Final Exercise Prescription Changes): Exercise Prescription Changes - 09/17/18 1200      Response to Exercise   Blood Pressure (Admit)  140/64    Blood Pressure (Exercise)  120/70    Blood Pressure (Exit)  126/60    Heart Rate (Admit)  86 bpm    Heart Rate (Exercise)  103 bpm    Heart Rate (Exit)  81 bpm    Oxygen Saturation (Admit)  94 %    Oxygen Saturation (Exercise)  94 %    Oxygen Saturation (Exit)  94 %    Rating of Perceived Exertion (Exercise)  11    Perceived Dyspnea (Exercise)  1    Duration  Progress to 45 minutes of aerobic exercise without signs/symptoms of physical distress    Intensity  THRR unchanged      Progression   Progression  Continue to progress workloads to maintain intensity without signs/symptoms of physical distress.      Resistance Training   Training Prescription  Yes    Weight  blue bands    Reps  10-15    Time  10 Minutes      Oxygen   Oxygen  Continuous    Liters  2      Recumbant Bike   Level  3    Watts  10    Minutes  17      NuStep   Level  5    SPM  80    Minutes  17    METs  2.2      Track   Laps  9    Minutes  17       Functional Capacity: 6 Minute Walk    Row Name 07/04/18 0728 09/26/18 1313       6 Minute Walk   Phase  Initial  Discharge    Distance  1113 feet  1022 feet    Distance Feet Change  -  91 ft    Walk Time  6 minutes  5.66 minutes    # of Rest Breaks  0  1 seated for the last 20 seconds of test    MPH  2.1  1.94    METS  2.61  2.45    RPE  12  13    Perceived Dyspnea   1  2    Symptoms  Yes (comment)  No    Comments  used wheelchair/leg fatigue  used wheelchair for oxygen    Resting HR  88 bpm  83 bpm    Resting BP  144/80  132/62    Resting Oxygen Saturation   91 %  100 %    Exercise Oxygen Saturation  during 6 min walk  89 %  91 %    Max Ex. HR  112 bpm  105 bpm    Max Ex. BP  194/80  142/72    2 Minute Post BP   148/68  118/68      Interval HR   1 Minute HR  98  96    2 Minute HR  104  99    3 Minute HR  108  101    4 Minute HR  108  101    5 Minute HR  110  104    6 Minute HR  112  105    2 Minute Post HR  98  90    Interval Heart Rate?  Yes  Yes      Interval Oxygen   Interval Oxygen?  Yes  Yes    Baseline Oxygen Saturation %  91 %  100 %    1 Minute Oxygen Saturation %  93 %  94 %    1 Minute Liters of Oxygen  2 L  2 L    2 Minute Oxygen Saturation %  90 %  93 %    2 Minute Liters of Oxygen  2 L  2 L    3 Minute Oxygen Saturation %  89 %  92 %    3 Minute Liters of Oxygen  2 L  2 L    4 Minute Oxygen Saturation %  90 %  92 %    4 Minute Liters of Oxygen  2 L  2 L  5 Minute Oxygen Saturation %  91 %  92 %    5 Minute Liters of Oxygen  2 L  2 L    6 Minute Oxygen Saturation %  91 %  91 %    6 Minute Liters of Oxygen  2 L  2 L    2 Minute Post Oxygen Saturation %  95 %  92 %    2 Minute Post Liters of Oxygen  2 L  2 L       Psychological, QOL, Others - Outcomes: PHQ 2/9: Depression screen [facility] 2/9 09/26/2018 07/01/2018  Decreased Interest 0 0  Down, Depressed, Hopeless 1 0  PHQ - 2 Score 1 0  Altered sleeping 0 0  Tired, decreased energy 1 2  Change in appetite 0 0  Feeling bad or failure about yourself  1 0  Trouble concentrating 0 0  Moving slowly or fidgety/restless 0 0  PHQ-9 Score 3 2  Difficult doing work/chores Not difficult at all Not difficult at all    Quality of Life:   Personal Goals: Goals established at orientation with interventions provided to work toward goal. Personal Goals and Risk Factors at Admission - 07/01/18 1053      Core Components/Risk Factors/Patient Goals on Admission    Weight Management  Yes;Weight Maintenance    Intervention  Weight Management: Develop a combined nutrition and exercise program designed to reach desired caloric intake, while maintaining appropriate intake of nutrient and fiber, sodium and fats, and appropriate energy  expenditure required for the weight goal.;Weight Management: Provide education and appropriate resources to help participant work on and attain dietary goals.;Weight Management/Obesity: Establish reasonable short term and long term weight goals.    Admit Weight  156 lb 4.9 oz (70.9 kg)    Expected Outcomes  Understanding of distribution of calorie intake throughout the day with the consumption of 4-5 meals/snacks;Understanding recommendations for meals to include 15-35% energy as protein, 25-35% energy from fat, 35-60% energy from carbohydrates, less than 260m of dietary cholesterol, 20-35 gm of total fiber daily;Weight Maintenance: Understanding of the daily nutrition guidelines, which includes 25-35% calories from fat, 7% or less cal from saturated fats, less than 2060mcholesterol, less than 1.5gm of sodium, & 5 or more servings of fruits and vegetables daily;Short Term: Continue to assess and modify interventions until short term weight is achieved;Long Term: Adherence to nutrition and physical activity/exercise program aimed toward attainment of established weight goal    Improve shortness of breath with ADL's  Yes    Intervention  Provide education, individualized exercise plan and daily activity instruction to help decrease symptoms of SOB with activities of daily living.    Expected Outcomes  Short Term: Improve cardiorespiratory fitness to achieve a reduction of symptoms when performing ADLs;Long Term: Be able to perform more ADLs without symptoms or delay the onset of symptoms    Diabetes  Yes    Intervention  Provide education about signs/symptoms and action to take for hypo/hyperglycemia.;Provide education about proper nutrition, including hydration, and aerobic/resistive exercise prescription along with prescribed medications to achieve blood glucose in normal ranges: Fasting glucose 65-99 mg/dL    Expected Outcomes  Short Term: Participant verbalizes understanding of the signs/symptoms and  immediate care of hyper/hypoglycemia, proper foot care and importance of medication, aerobic/resistive exercise and nutrition plan for blood glucose control.;Long Term: Attainment of HbA1C < 7%.    Hypertension  Yes    Intervention  Provide education on lifestyle modifcations including regular physical activity/exercise,  weight management, moderate sodium restriction and increased consumption of fresh fruit, vegetables, and low fat dairy, alcohol moderation, and smoking cessation.;Monitor prescription use compliance.    Expected Outcomes  Short Term: Continued assessment and intervention until BP is < 140/26m HG in hypertensive participants. < 130/864mHG in hypertensive participants with diabetes, heart failure or chronic kidney disease.;Long Term: Maintenance of blood pressure at goal levels.        Personal Goals Discharge: Goals and Risk Factor Review    Row Name 07/09/18 1734 07/10/18 0951 08/07/18 1454 09/04/18 1625 11/17/18 1719     Core Components/Risk Factors/Patient Goals Review   Personal Goals Review  Weight Management/Obesity;Improve shortness of breath with ADL's;Increase knowledge of respiratory medications and ability to use respiratory devices properly.;Heart Failure;Hypertension;Diabetes;Develop more efficient breathing techniques such as purse lipped breathing and diaphragmatic breathing and practicing self-pacing with activity.  -  Weight Management/Obesity;Improve shortness of breath with ADL's;Increase knowledge of respiratory medications and ability to use respiratory devices properly.;Heart Failure;Hypertension;Diabetes;Develop more efficient breathing techniques such as purse lipped breathing and diaphragmatic breathing and practicing self-pacing with activity.  Weight Management/Obesity;Improve shortness of breath with ADL's;Increase knowledge of respiratory medications and ability to use respiratory devices properly.;Heart Failure;Hypertension;Diabetes;Develop more efficient  breathing techniques such as purse lipped breathing and diaphragmatic breathing and practicing self-pacing with activity.  Weight Management/Obesity;Improve shortness of breath with ADL's;Increase knowledge of respiratory medications and ability to use respiratory devices properly.;Develop more efficient breathing techniques such as purse lipped breathing and diaphragmatic breathing and practicing self-pacing with activity.   Review  Pt started pulmonary rehab today - 8/20.  Unable to adequately assess pt progress toward pulmonary rehab goals.  Pt started pulmonary rehab today - 8/20.  Unable to adequately assess pt progress toward pulmonary rehab goals. Anticipate pt will show measurable progress during the next 30 day assessment.  Pt has completed 9 exercise sessions.  Pt weight remains unchanged from inital session.  Pt attended respiratory medication education class.  Pt developing technique for PLB techniques. Bp remain slightly elevated 140's  Pt rates dypnea level 1-2.  Pt reports CBG within normal limits this is an improvement from earlier sessions.  Pt understands the caloric needs for exercise.  Pt workloads remain flat with level 2 for recumbent bike and nustep.  pt averages 9 laps on the track.  Continue to monitor pt progress during teh next 30 day assessment  Pt has completed 17 exercise sessions and 7 education classes.  Pt weight decreased to .8 kg from 8/20.   Pt developing techniques for PLB techniques needs verbal reminders.  Pt rates dypnea level 1-2.  Pt reports CBG within normal limits this is an improvement from earlier sessions.  Pt understands the caloric needs for exercise.  Pt workloads increase to level 3 for recumbent bike and nustep.  pt averages 7-9 laps on the track.  Continue to monitor pt progress during the next 30 day review  Pt graduates with the completion of 24 exercise sessions.  Pt plans to continue exercise with chair exercise and some walking.   Expected Outcomes  See  Admission Goals  See Admission Goals/Outocmes  See Admission Goals/Outocmes  See Admission Goals/Outocmes  Pt made progress toward Program Goals and Outcomes      Exercise Goals and Review: Exercise Goals    Row Name 07/01/18 1042             Exercise Goals   Increase Physical Activity  Yes       Intervention  Provide  advice, education, support and counseling about physical activity/exercise needs.;Develop an individualized exercise prescription for aerobic and resistive training based on initial evaluation findings, risk stratification, comorbidities and participant's personal goals.       Expected Outcomes  Short Term: Attend rehab on a regular basis to increase amount of physical activity.;Long Term: Add in home exercise to make exercise part of routine and to increase amount of physical activity.;Long Term: Exercising regularly at least 3-5 days a week.       Increase Strength and Stamina  Yes       Intervention  Provide advice, education, support and counseling about physical activity/exercise needs.;Develop an individualized exercise prescription for aerobic and resistive training based on initial evaluation findings, risk stratification, comorbidities and participant's personal goals.       Expected Outcomes  Short Term: Increase workloads from initial exercise prescription for resistance, speed, and METs.;Short Term: Perform resistance training exercises routinely during rehab and add in resistance training at home;Long Term: Improve cardiorespiratory fitness, muscular endurance and strength as measured by increased METs and functional capacity (6MWT)       Able to understand and use rate of perceived exertion (RPE) scale  Yes       Intervention  Provide education and explanation on how to use RPE scale       Expected Outcomes  Short Term: Able to use RPE daily in rehab to express subjective intensity level;Long Term:  Able to use RPE to guide intensity level when exercising  independently       Able to understand and use Dyspnea scale  Yes       Intervention  Provide education and explanation on how to use Dyspnea scale       Expected Outcomes  Short Term: Able to use Dyspnea scale daily in rehab to express subjective sense of shortness of breath during exertion;Long Term: Able to use Dyspnea scale to guide intensity level when exercising independently       Knowledge and understanding of Target Heart Rate Range (THRR)  Yes       Intervention  Provide education and explanation of THRR including how the numbers were predicted and where they are located for reference       Expected Outcomes  Short Term: Able to state/look up THRR;Short Term: Able to use daily as guideline for intensity in rehab;Long Term: Able to use THRR to govern intensity when exercising independently       Understanding of Exercise Prescription  Yes       Intervention  Provide education, explanation, and written materials on patient's individual exercise prescription       Expected Outcomes  Short Term: Able to explain program exercise prescription;Long Term: Able to explain home exercise prescription to exercise independently          Exercise Goals Re-Evaluation: Exercise Goals Re-Evaluation    Row Name 07/08/18 1621 08/05/18 1424 09/03/18 1548         Exercise Goal Re-Evaluation   Exercise Goals Review  Increase Physical Activity;Increase Strength and Stamina;Able to understand and use rate of perceived exertion (RPE) scale;Able to understand and use Dyspnea scale;Knowledge and understanding of Target Heart Rate Range (THRR);Understanding of Exercise Prescription  Increase Physical Activity;Increase Strength and Stamina;Able to understand and use rate of perceived exertion (RPE) scale;Able to understand and use Dyspnea scale;Knowledge and understanding of Target Heart Rate Range (THRR);Understanding of Exercise Prescription  Increase Physical Activity;Increase Strength and Stamina;Able to  understand and use rate of perceived exertion (  RPE) scale;Able to understand and use Dyspnea scale;Knowledge and understanding of Target Heart Rate Range (THRR);Understanding of Exercise Prescription     Comments  Patient will start first day of rehab on 07/09/18  Patient is progressing well in rehab. She is able to walk up to 9 laps (200 ft each) in 15 minutes. Will encourage home exercise. Will monitor and motivate.  Patient is progressing well in rehab. She is able to walk up to 9-11 laps (200 ft each) in 15 minutes. MET average places her in a lower level 2.2. Will encourage home exercise. Will monitor and motivate.     Expected Outcomes  -  Through exercise at home and rehab, patient will build endurance and be able to perform ADL's with less shortness of breath, Patient will also feel comfrotable estblaishing an exercise routine at home.  Through exercise at rehab and at home, patient will increase functional ability, have a decrease in sob with ADL's, and establish an exercise regimine at home.        Nutrition & Weight - Outcomes:    Nutrition: Nutrition Therapy & Goals - 07/26/18 1150      Nutrition Therapy   Diet  consistent carbohydrate heart healthy      Personal Nutrition Goals   Nutrition Goal  The pt will recognize symptoms that can interfere with adequate oral intake    Personal Goal #2  Identify food quantities necessary to achieve wt loss of  -2# per week to a goal wt loss of 2.7-10.9 kg (6-15 lb) at graduation from pulmonary rehab.    Personal Goal #3  Pt to identify and limit food sources of sodium,saturated fat, trans fat, refined carbohydrates      Intervention Plan   Intervention  Prescribe, educate and counsel regarding individualized specific dietary modifications aiming towards targeted core components such as weight, hypertension, lipid management, diabetes, heart failure and other comorbidities.    Expected Outcomes  Short Term Goal: Understand basic principles of  dietary content, such as calories, fat, sodium, cholesterol and nutrients.       Nutrition Discharge: Nutrition Assessments - 09/27/18 1159      Rate Your Plate Scores   Pre Score  46    Post Score  40       Education Questionnaire Score: Knowledge Questionnaire Score - 09/26/18 1250      Knowledge Questionnaire Score   Pre Score  17/18    Post Score  14/18       Goals reviewed with patient. Cherre Huger, BSN Cardiac and Training and development officer

## 2018-11-17 NOTE — Addendum Note (Signed)
Encounter addended by: Rowe Pavy, RN on: 11/17/2018 5:26 PM  Actions taken: Episode resolved, Flowsheet data copied forward, Visit Navigator Flowsheet section accepted, Clinical Note Signed

## 2018-11-19 NOTE — Telephone Encounter (Signed)
Thanks for the update

## 2018-11-26 ENCOUNTER — Other Ambulatory Visit: Payer: Self-pay | Admitting: Endocrinology

## 2018-11-26 DIAGNOSIS — E876 Hypokalemia: Secondary | ICD-10-CM | POA: Diagnosis not present

## 2018-12-16 ENCOUNTER — Encounter: Payer: Self-pay | Admitting: Endocrinology

## 2018-12-16 ENCOUNTER — Ambulatory Visit (INDEPENDENT_AMBULATORY_CARE_PROVIDER_SITE_OTHER): Payer: Medicare Other | Admitting: Endocrinology

## 2018-12-16 VITALS — BP 134/64 | HR 96 | Ht 63.0 in | Wt 152.0 lb

## 2018-12-16 DIAGNOSIS — Z794 Long term (current) use of insulin: Secondary | ICD-10-CM | POA: Diagnosis not present

## 2018-12-16 DIAGNOSIS — E1122 Type 2 diabetes mellitus with diabetic chronic kidney disease: Secondary | ICD-10-CM | POA: Diagnosis not present

## 2018-12-16 DIAGNOSIS — N183 Chronic kidney disease, stage 3 (moderate): Secondary | ICD-10-CM | POA: Diagnosis not present

## 2018-12-16 LAB — POCT GLYCOSYLATED HEMOGLOBIN (HGB A1C): HEMOGLOBIN A1C: 6.1 % — AB (ref 4.0–5.6)

## 2018-12-16 NOTE — Progress Notes (Signed)
Subjective:    Patient ID: [name], female    DOB: 10/08/1941, 78 y.o.   MRN: [id number]  HPI Pt returns for f/u of diabetes mellitus: DM type: 2 Dx'ed: 0300 Complications: CAD and renal failure (transplant 2013) Therapy: glipizide GDM: never DKA: never Severe hypoglycemia: never Pancreatitis: never Pancreatic imaging: normal on 2015 CT Other: she has never been on insulin; she intermittently takes steroids for COPD; she requests generic meds Interval history: she brings a record of her cbg's which I have reviewed today.  She checks fasting.  It varies from 79-139.  pt states she feels well in general.  No recent change in prednisone dosage.  Past Medical History:  Diagnosis Date  . 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 Asthma   . Coronary artery disease    cath 11/2007, grafts patent /  Nuclear, June, 2011, prior inferior MI with mild peri-infarct ischemia, anterior breast attenuation, EF 67%, done for renal transplant assessment / Low level exercise/Lexiscan Myoview (11/2013): EF 67%, no ischemi; normal study  . Diabetes mellitus    type 2  . ESRD on dialysis [facility])    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, 2012  . Family history of breast cancer   . Family history of prostate cancer   . GERD (gastroesophageal reflux disease)   . GI bleed 11/26/2010   January, 2012 , AVM  . Gout   . History of methicillin resistant staphylococcus aureus (MRSA)   . Hyperlipidemia   . Hyperparathyroidism   . Hypertension   . Myocardial infarction ([place])   . Osteoporosis   . Pneumonia 08/2010    Hospitalization, October, 2011  . Primary osteoarthritis, 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  . S/P kidney transplant    September, 2012, [facility]  . Subacromial impingement of 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    Past Surgical History:  Procedure Laterality Date  . ABDOMINAL HYSTERECTOMY  1980'S  . ARTERIOVENOUS GRAFT PLACEMENT Left  03/04/2007   forearm  . BREAST EXCISIONAL BIOPSY    . BREAST SURGERY  YRS AGO   RT BREAST CYST REMOVED   . CARDIAC CATHETERIZATION  06/03/2002; 12/04/2007  . COLONOSCOPY WITH PROPOFOL N/A 07/14/2014   Procedure: COLONOSCOPY WITH PROPOFOL;  Surgeon: Lear Ng, MD;  Location: [facility];  Service: Endoscopy;  Laterality: N/A;  . CORONARY ARTERY BYPASS GRAFT  06/06/2002   x 4  . HOT HEMOSTASIS N/A 07/14/2014   Procedure: HOT HEMOSTASIS (ARGON PLASMA COAGULATION/BICAP);  Surgeon: Lear Ng, MD;  Location: [facility];  Service: Endoscopy;  Laterality: N/A;  . KIDNEY TRANSPLANT Right 08/04/2011  . ORIF PATELLA Left 04/06/2016   Procedure: OPEN REDUCTION INTERNAL (ORIF) LEFT  PATELLA;  Surgeon: Ninetta Lights, MD;  Location: [place];  Service: Orthopedics;  Laterality: Left;  . SHOULDER ARTHROSCOPY WITH ROTATOR CUFF REPAIR AND SUBACROMIAL DECOMPRESSION Left 08/03/2016   Procedure: LEFT SHOULDER ARTHROSCOPY WITH ROTATOR CUFF REPAIR AND SUBACROMIAL DECOMPRESSION with distal claviculectomy and extentsive debridement;  Surgeon: Ninetta Lights, MD;  Location: [place];  Service: Orthopedics;  Laterality: Left;  Block  . THROMBECTOMY AND REVISION OF ARTERIOVENTOUS (AV) GORETEX  GRAFT Left 05/08/2007   forearm  . TOTAL HIP ARTHROPLASTY  12/18/2012   Procedure: TOTAL HIP ARTHROPLASTY;  Surgeon: Ninetta Lights, MD;  Location: [place];  Service: Orthopedics;  Laterality: Left;  Marland Kitchen VESICOVAGINAL FISTULA CLOSURE W/ TAH  1984  Social History   Socioeconomic History  . Marital status: Divorced    Spouse name: Not on file  . Number of children: Not on file  . Years of education: Not on file  . Highest education level: Not on file  Occupational History  . Not on file  Social Needs  . Financial resource strain: Not on file  . Food insecurity:    Worry: Not on file    Inability: Not on file  . Transportation needs:    Medical: Not on file     Non-medical: Not on file  Tobacco Use  . Smoking status: Former Smoker    Packs/day: 1.00    Years: 8.00    Pack years: 8.00    Types: Cigarettes    Last attempt to quit: 11/21/1983    Years since quitting: 35.0  . Smokeless tobacco: Never Used  Substance and Sexual Activity  . Alcohol use: No  . Drug use: No  . Sexual activity: Not on file  Lifestyle  . Physical activity:    Days per week: Not on file    Minutes per session: Not on file  . Stress: Not on file  Relationships  . Social connections:    Talks on phone: Not on file    Gets together: Not on file    Attends religious service: Not on file    Active member of club or organization: Not on file    Attends meetings of clubs or organizations: Not on file    Relationship status: Not on file  . Intimate partner violence:    Fear of current or ex partner: Not on file    Emotionally abused: Not on file    Physically abused: Not on file    Forced sexual activity: Not on file  Other Topics Concern  . Not on file  Social History Narrative  . Not on file    Current Outpatient Medications on File Prior to Visit  Medication Sig Dispense Refill  . amLODipine (NORVASC) 10 MG tablet Take 10 mg by mouth every morning.     Marland Kitchen aspirin EC 81 MG tablet Take 81 mg by mouth daily.    Marland Kitchen azaTHIOprine (IMURAN) 50 MG tablet Take 50 mg by mouth daily.    . Azelastine HCl 0.15 % SOLN Place 1 spray into the nose 2 (two) times daily.    . calcitRIOL (ROCALTROL) 0.25 MCG capsule Take 0.25 mcg by mouth daily.     . Continuous Blood Gluc Sensor (FREESTYLE LIBRE 14 DAY SENSOR) MISC LENGTH OF NEED: LIFETIME - UNLESS SPECIFIED OTHERWISE 1 each 1  . ferrous sulfate 325 (65 FE) MG tablet Take 325 mg by mouth daily with breakfast.    . fluticasone (FLONASE) 50 MCG/ACT nasal spray Place 1 spray into both nostrils 2 (two) times daily.    . fluticasone furoate-vilanterol (BREO ELLIPTA) 200-25 MCG/INH AEPB Inhale 1 puff into the lungs daily.    .  nitroGLYCERIN (NITROSTAT) 0.4 MG SL tablet Place 1 tablet (0.4 mg total) under the tongue every 5 (five) minutes as needed for chest pain. 25 tablet 1  . omeprazole (PRILOSEC) 20 MG capsule Take 20 mg by mouth daily.     . predniSONE (DELTASONE) 5 MG tablet Take 1 tablet (5 mg total) by mouth daily.    . sennosides-docusate sodium (SENOKOT-S) 8.6-50 MG tablet Take 1 tablet by mouth daily.    . tacrolimus (PROGRAF) 1 MG capsule Take 1 mg by mouth 2 (two) times daily.  Pt takes 5 capsules (5mg ) in the morning at 9 am and 6 capsules(6mg ) at 9 pm    . Tiotropium Bromide Monohydrate (SPIRIVA RESPIMAT) 1.25 MCG/ACT AERS Inhale 1 puff into the lungs daily.    Marland Kitchen triamterene-hydrochlorothiazide (MAXZIDE-25) 37.5-25 MG tablet Take 1 tablet by mouth daily. 30 tablet 0  . rosuvastatin (CRESTOR) 10 MG tablet Take 1 tablet (10 mg total) by mouth daily. 90 tablet 3   No current facility-administered medications on file prior to visit.     Allergies  Allergen Reactions  . Lactose Other (See Comments)  . Asa Arthritis Strength-Antacid [Aspirin Buffered] Other (See Comments)    STOMACH BURNS, N/V   . Aspirin Nausea And Vomiting    Per pt. "can tolerate the enteric coated tablets".   . Atorvastatin Other (See Comments)    Patient's skin was skin was sensitive  . Banana Nausea And Vomiting  . Lactose Intolerance (Gi) Nausea And Vomiting  . Lisinopril Cough    Family History  Problem Relation Age of Onset  . Breast cancer Sister 72  . Prostate cancer Brother   . Lung cancer Brother   . Cancer Neg Hx     BP 134/64 (BP Location: Right Arm, Patient Position: Sitting, Cuff Size: Normal)   Pulse 96   Ht 5\' 3"  (1.6 m)   Wt 152 lb (68.9 kg)   SpO2 90% Comment: 2L O2  BMI 26.93 kg/m    Review of Systems She denies hypoglycemia.      Objective:   Physical Exam VITAL SIGNS:  See vs page GENERAL: no distress.  Has 02 on Pulses: dorsalis pedis intact bilat.   MSK: no deformity of the feet CV: no  leg edema Skin:  no ulcer on the feet.  normal color and temp on the feet. Neuro: sensation is intact to touch on the feet Ext: There is bilateral onychomycosis of the toenails.    A1c=6.2%   Lab Results  Component Value Date   CREATININE 1.05 (H) 06/05/2018   BUN 14 06/05/2018   NA 143 06/05/2018   K 3.5 06/05/2018   CL 102 06/05/2018   CO2 25 06/05/2018       Assessment & Plan:  Type 2 DM: overcontrolled, given this SU-containing regimen.   Patient Instructions  check your blood sugar once a day.  vary the time of day when you check, between before the 3 meals, and at bedtime.  also check if you have symptoms of your blood sugar being too high or too low.  please keep a record of the readings and bring it to your next appointment here (or you can bring the meter itself).  You can write it on any piece of paper.  please call us sooner if your blood sugar goes below 70, or if you have a lot of readings over 200.   Please stop taking the glipizide   Please come back for a follow-up appointment in 2 months.

## 2018-12-16 NOTE — Patient Instructions (Addendum)
check your blood sugar once a day.  vary the time of day when you check, between before the 3 meals, and at bedtime.  also check if you have symptoms of your blood sugar being too high or too low.  please keep a record of the readings and bring it to your next appointment here (or you can bring the meter itself).  You can write it on any piece of paper.  please call us sooner if your blood sugar goes below 70, or if you have a lot of readings over 200.   Please stop taking the glipizide   Please come back for a follow-up appointment in 2 months.

## 2018-12-25 DIAGNOSIS — E876 Hypokalemia: Secondary | ICD-10-CM | POA: Diagnosis not present

## 2019-01-07 DIAGNOSIS — Z9483 Pancreas transplant status: Secondary | ICD-10-CM | POA: Diagnosis not present

## 2019-01-07 DIAGNOSIS — Z940 Kidney transplant status: Secondary | ICD-10-CM | POA: Diagnosis not present

## 2019-02-10 DIAGNOSIS — K210 Gastro-esophageal reflux disease with esophagitis: Secondary | ICD-10-CM | POA: Diagnosis not present

## 2019-02-10 DIAGNOSIS — I25810 Atherosclerosis of coronary artery bypass graft(s) without angina pectoris: Secondary | ICD-10-CM | POA: Diagnosis not present

## 2019-02-10 DIAGNOSIS — N183 Chronic kidney disease, stage 3 (moderate): Secondary | ICD-10-CM | POA: Diagnosis not present

## 2019-02-10 DIAGNOSIS — M109 Gout, unspecified: Secondary | ICD-10-CM | POA: Diagnosis not present

## 2019-02-10 DIAGNOSIS — N2581 Secondary hyperparathyroidism of renal origin: Secondary | ICD-10-CM | POA: Diagnosis not present

## 2019-02-10 DIAGNOSIS — Z940 Kidney transplant status: Secondary | ICD-10-CM | POA: Diagnosis not present

## 2019-02-10 DIAGNOSIS — D631 Anemia in chronic kidney disease: Secondary | ICD-10-CM | POA: Diagnosis not present

## 2019-02-10 DIAGNOSIS — I129 Hypertensive chronic kidney disease with stage 1 through stage 4 chronic kidney disease, or unspecified chronic kidney disease: Secondary | ICD-10-CM | POA: Diagnosis not present

## 2019-02-10 DIAGNOSIS — K31811 Angiodysplasia of stomach and duodenum with bleeding: Secondary | ICD-10-CM | POA: Diagnosis not present

## 2019-02-10 DIAGNOSIS — E1122 Type 2 diabetes mellitus with diabetic chronic kidney disease: Secondary | ICD-10-CM | POA: Diagnosis not present

## 2019-02-10 DIAGNOSIS — E785 Hyperlipidemia, unspecified: Secondary | ICD-10-CM | POA: Diagnosis not present

## 2019-02-14 ENCOUNTER — Ambulatory Visit (INDEPENDENT_AMBULATORY_CARE_PROVIDER_SITE_OTHER): Payer: Medicare Other | Admitting: Endocrinology

## 2019-02-14 ENCOUNTER — Other Ambulatory Visit: Payer: Self-pay

## 2019-02-14 ENCOUNTER — Encounter: Payer: Self-pay | Admitting: Endocrinology

## 2019-02-14 VITALS — BP 160/82 | HR 89 | Temp 98.5°F | Ht 63.0 in | Wt 155.0 lb

## 2019-02-14 DIAGNOSIS — Z794 Long term (current) use of insulin: Secondary | ICD-10-CM | POA: Diagnosis not present

## 2019-02-14 DIAGNOSIS — E1122 Type 2 diabetes mellitus with diabetic chronic kidney disease: Secondary | ICD-10-CM | POA: Diagnosis not present

## 2019-02-14 DIAGNOSIS — N183 Chronic kidney disease, stage 3 (moderate): Secondary | ICD-10-CM | POA: Diagnosis not present

## 2019-02-14 LAB — POCT GLYCOSYLATED HEMOGLOBIN (HGB A1C): HEMOGLOBIN A1C: 6 % — AB (ref 4.0–5.6)

## 2019-02-14 NOTE — Patient Instructions (Addendum)
Your blood pressure is high today.  Please continue to follow this up with Dr Deterding.  check your blood sugar once a day.  vary the time of day when you check, between before the 3 meals, and at bedtime.  also check if you have symptoms of your blood sugar being too high or too low.  please keep a record of the readings and bring it to your next appointment here (or you can bring the meter itself).  You can write it on any piece of paper.  please call us sooner if your blood sugar goes below 70, or if you have a lot of readings over 200.   No medication is needed for the diabetes now.  However, please call if the prednisone is increased.   Please come back for a follow-up appointment in 3-4 months.

## 2019-02-14 NOTE — Progress Notes (Signed)
Subjective:    Patient ID: [name], female    DOB: [birth year], 78 y.o.   MRN: [id number]  HPI Pt returns for f/u of diabetes mellitus: DM type: 2 Dx'ed: 7902 Complications: CAD and renal failure (transplant 2013) Therapy: glipizide GDM: never DKA: never Severe hypoglycemia: never Pancreatitis: never Pancreatic imaging: normal on 2015 CT Other: she has never been on insulin; she intermittently takes steroids for COPD; she requests generic meds Interval history: pt says cbg's are well-controlled.   pt states she feels well in general, except for chronic sob.  No recent change in low prednisone dosage.   Past Medical History:  Diagnosis Date  . 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 Asthma   . Coronary artery disease    cath 11/2007, grafts patent /  Nuclear, June, 2011, prior inferior MI with mild peri-infarct ischemia, anterior breast attenuation, EF 67%, done for renal transplant assessment / Low level exercise/Lexiscan Myoview (11/2013): EF 67%, no ischemi; normal study  . Diabetes mellitus    type 2  . ESRD on dialysis [facility])    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, 2012  . Family history of breast cancer   . Family history of prostate cancer   . GERD (gastroesophageal reflux disease)   . GI bleed 11/26/2010   January, 2012 , AVM  . Gout   . History of methicillin resistant staphylococcus aureus (MRSA)   . Hyperlipidemia   . Hyperparathyroidism   . Hypertension   . Myocardial infarction ([place])   . Osteoporosis   . Pneumonia 08/2010    Hospitalization, October, 2011  . Primary osteoarthritis, 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  . S/P kidney transplant    September, 2012, [facility]  . Subacromial impingement of 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    Past Surgical History:  Procedure Laterality Date  . ABDOMINAL HYSTERECTOMY  1980'S  . ARTERIOVENOUS GRAFT PLACEMENT Left 03/04/2007   forearm  . BREAST EXCISIONAL  BIOPSY    . BREAST SURGERY  YRS AGO   RT BREAST CYST REMOVED   . CARDIAC CATHETERIZATION  06/03/2002; 12/04/2007  . COLONOSCOPY WITH PROPOFOL N/A 07/14/2014   Procedure: COLONOSCOPY WITH PROPOFOL;  Surgeon: Lear Ng, MD;  Location: [facility];  Service: Endoscopy;  Laterality: N/A;  . CORONARY ARTERY BYPASS GRAFT  06/06/2002   x 4  . HOT HEMOSTASIS N/A 07/14/2014   Procedure: HOT HEMOSTASIS (ARGON PLASMA COAGULATION/BICAP);  Surgeon: Lear Ng, MD;  Location: [facility];  Service: Endoscopy;  Laterality: N/A;  . KIDNEY TRANSPLANT Right 08/04/2011  . ORIF PATELLA Left 04/06/2016   Procedure: OPEN REDUCTION INTERNAL (ORIF) LEFT  PATELLA;  Surgeon: Ninetta Lights, MD;  Location: [place];  Service: Orthopedics;  Laterality: Left;  . SHOULDER ARTHROSCOPY WITH ROTATOR CUFF REPAIR AND SUBACROMIAL DECOMPRESSION Left 08/03/2016   Procedure: LEFT SHOULDER ARTHROSCOPY WITH ROTATOR CUFF REPAIR AND SUBACROMIAL DECOMPRESSION with distal claviculectomy and extentsive debridement;  Surgeon: Ninetta Lights, MD;  Location: [place];  Service: Orthopedics;  Laterality: Left;  Block  . THROMBECTOMY AND REVISION OF ARTERIOVENTOUS (AV) GORETEX  GRAFT Left 05/08/2007   forearm  . TOTAL HIP ARTHROPLASTY  12/18/2012   Procedure: TOTAL HIP ARTHROPLASTY;  Surgeon: Ninetta Lights, MD;  Location: [place];  Service: Orthopedics;  Laterality: Left;  Marland Kitchen VESICOVAGINAL FISTULA CLOSURE W/ TAH  1984    Social History   Socioeconomic History  .  Marital status: Divorced    Spouse name: Not on file  . Number of children: Not on file  . Years of education: Not on file  . Highest education level: Not on file  Occupational History  . Not on file  Social Needs  . Financial resource strain: Not on file  . Food insecurity:    Worry: Not on file    Inability: Not on file  . Transportation needs:    Medical: Not on file    Non-medical: Not on file  Tobacco Use  .  Smoking status: Former Smoker    Packs/day: 1.00    Years: 8.00    Pack years: 8.00    Types: Cigarettes    Last attempt to quit: 11/21/1983    Years since quitting: 35.2  . Smokeless tobacco: Never Used  Substance and Sexual Activity  . Alcohol use: No  . Drug use: No  . Sexual activity: Not on file  Lifestyle  . Physical activity:    Days per week: Not on file    Minutes per session: Not on file  . Stress: Not on file  Relationships  . Social connections:    Talks on phone: Not on file    Gets together: Not on file    Attends religious service: Not on file    Active member of club or organization: Not on file    Attends meetings of clubs or organizations: Not on file    Relationship status: Not on file  . Intimate partner violence:    Fear of current or ex partner: Not on file    Emotionally abused: Not on file    Physically abused: Not on file    Forced sexual activity: Not on file  Other Topics Concern  . Not on file  Social History Narrative  . Not on file    Current Outpatient Medications on File Prior to Visit  Medication Sig Dispense Refill  . amLODipine (NORVASC) 10 MG tablet Take 10 mg by mouth every morning.     Marland Kitchen aspirin EC 81 MG tablet Take 81 mg by mouth daily.    Marland Kitchen azaTHIOprine (IMURAN) 50 MG tablet Take 50 mg by mouth daily.    . Azelastine HCl 0.15 % SOLN Place 1 spray into the nose 2 (two) times daily.    . calcitRIOL (ROCALTROL) 0.25 MCG capsule Take 0.25 mcg by mouth daily.     . ferrous sulfate 325 (65 FE) MG tablet Take 325 mg by mouth daily with breakfast.    . fluticasone (FLONASE) 50 MCG/ACT nasal spray Place 1 spray into both nostrils 2 (two) times daily.    . fluticasone furoate-vilanterol (BREO ELLIPTA) 200-25 MCG/INH AEPB Inhale 1 puff into the lungs daily.    . nitroGLYCERIN (NITROSTAT) 0.4 MG SL tablet Place 1 tablet (0.4 mg total) under the tongue every 5 (five) minutes as needed for chest pain. 25 tablet 1  . omeprazole (PRILOSEC) 20 MG  capsule Take 20 mg by mouth daily.     . predniSONE (DELTASONE) 5 MG tablet Take 1 tablet (5 mg total) by mouth daily.    . sennosides-docusate sodium (SENOKOT-S) 8.6-50 MG tablet Take 1 tablet by mouth daily.    . tacrolimus (PROGRAF) 1 MG capsule Take 1 mg by mouth 2 (two) times daily. Pt takes 5 capsules (5mg ) in the morning at 9 am and 6 capsules(6mg ) at 9 pm    . Tiotropium Bromide Monohydrate (SPIRIVA RESPIMAT) 1.25 MCG/ACT AERS Inhale 1  puff into the lungs daily.    Marland Kitchen triamterene-hydrochlorothiazide (MAXZIDE-25) 37.5-25 MG tablet Take 1 tablet by mouth daily. 30 tablet 0  . rosuvastatin (CRESTOR) 10 MG tablet Take 1 tablet (10 mg total) by mouth daily. 90 tablet 3   No current facility-administered medications on file prior to visit.     Allergies  Allergen Reactions  . Lactose Other (See Comments)  . Asa Arthritis Strength-Antacid [Aspirin Buffered] Other (See Comments)    STOMACH BURNS, N/V   . Aspirin Nausea And Vomiting    Per pt. "can tolerate the enteric coated tablets".   . Atorvastatin Other (See Comments)    Patient's skin was skin was sensitive  . Banana Nausea And Vomiting  . Lactose Intolerance (Gi) Nausea And Vomiting  . Lisinopril Cough    Family History  Problem Relation Age of Onset  . Breast cancer Sister 66  . Prostate cancer Brother   . Lung cancer Brother   . Cancer Neg Hx     BP (!) 160/82 (BP Location: Left Arm, Patient Position: Sitting, Cuff Size: Normal)   Pulse 89   Temp 98.5 F (36.9 C) (Oral)   Ht 5\' 3"  (1.6 m)   Wt 155 lb (70.3 kg)   SpO2 94%   BMI 27.46 kg/m    Review of Systems No weight change    Objective:   Physical Exam VITAL SIGNS:  See vs page GENERAL: no distress.  Has O2 on.   Pulses: dorsalis pedis intact bilat.   MSK: no deformity of the feet CV: no leg edema Skin:  no ulcer on the feet.  normal color and temp on the feet. Neuro: sensation is intact to touch on the feet Ext: There is bilateral onychomycosis of the  toenails.  Several ingrown toenails, but no erythema/swell/drainage  Lab Results  Component Value Date   CREATININE 1.05 (H) 06/05/2018   BUN 14 06/05/2018   NA 143 06/05/2018   K 3.5 06/05/2018   CL 102 06/05/2018   CO2 25 06/05/2018    Lab Results  Component Value Date   HGBA1C 6.0 (A) 02/14/2019       Assessment & Plan:  Type 2 DM: stable off rx HTN: is noted today COPD: she will prob need insulin if she goes back on steroids.  We discussed.    Patient Instructions  Your blood pressure is high today.  Please continue to follow this up with Dr Deterding.  check your blood sugar once a day.  vary the time of day when you check, between before the 3 meals, and at bedtime.  also check if you have symptoms of your blood sugar being too high or too low.  please keep a record of the readings and bring it to your next appointment here (or you can bring the meter itself).  You can write it on any piece of paper.  please call us sooner if your blood sugar goes below 70, or if you have a lot of readings over 200.   No medication is needed for the diabetes now.  However, please call if the prednisone is increased.   Please come back for a follow-up appointment in 3-4 months.

## 2019-02-18 ENCOUNTER — Telehealth: Payer: Self-pay

## 2019-02-18 NOTE — Telephone Encounter (Signed)
Pt has given consent to telephone visit on 02/21/2019 at 10am with Dr. Acie Fredrickson. Pt does not have the capability to take BP or HR but she will have her weight ready for her appt.       YOUR CARDIOLOGY TEAM HAS ARRANGED FOR AN E-VISIT FOR YOUR APPOINTMENT - PLEASE REVIEW IMPORTANT INFORMATION BELOW SEVERAL DAYS PRIOR TO YOUR APPOINTMENT  Due to the recent COVID-19 pandemic, we are transitioning in-person office visits to tele-medicine visits in an effort to decrease unnecessary exposure to our patients and staff. Medicare and most insurances are covering these visits without a copay needed. You will need a working email and a smartphone or computer with a camera and microphone. For patients that do not have these items, we can still complete the visit using a telephone but do prefer video when possible. If possible, we also ask that you have a blood pressure cuff and scale at home to measure your blood pressure, heart rate and weight prior to your scheduled appointment. Patients with clinical needs that need an in-person evaluation and testing will still be able to come to the office if absolutely necessary. If you have any questions, feel free to call our office.     DOWNLOADING THE SOFTWARE  Download the [employer] app to enable video and telephone visits with your [facility] Provider.   Instructions for downloading [employer] WebEx: - Go to https://[email or web address].[email or web address].[email or web address] and follow the instructions, or download the app on your smartphone [facility] [employer] Meetings). - If you have technical difficulties with downloading WebEx, please call WebEx at [phone]. - Once the app is downloaded (can be done on either mobile or desktop computer), go to Settings in the upper left hand corner.  Be sure that camera and audio are enabled.  - You will receive an email message with a link to the meeting with a time to join for your tele-health visit.  - Please download the app and have settings configured  prior to the appointment time.      2-3 DAYS BEFORE YOUR APPOINTMENT  One of our staff will call you to confirm that you have been able to set up your WebEx account. We will remind you check your blood pressure, heart rate and weight prior to your scheduled appointment. If you have an Apple Watch or Kardia, please upload any pertinent ECG strips the day before or morning of your appointment to [place]. Our staff will also make sure you have reviewed the consent and agree to move forward with your scheduled tele-health visit.    THE DAY OF YOUR APPOINTMENT  Approximately 15-20 minutes prior to your scheduled appointment, you will receive an e-mail directly from one of our staff member's [email or web address] e-mail accounts inviting you to join a WebEx meeting.  Please do not reply to that email - simply join the [employer].  Upon joining, a member of the office staff will speak with you initially through the WebEx platform to confirm medications, vital signs for the day and any symptoms you may be experiencing.  Please have this information available prior to the time of visit start.      CONSENT FOR TELE-HEALTH VISIT - PLEASE RVIEW  I hereby voluntarily request, consent and authorize [facility] and its employed or contracted physicians, physician assistants, nurse practitioners or other licensed health care professionals (the Practitioner), to provide me with telemedicine health care services (the "Services") as deemed necessary by the treating Practitioner. I acknowledge and consent to receive the Services by  the Practitioner via telemedicine. I understand that the telemedicine visit will involve communicating with the Practitioner through live audiovisual communication technology and the disclosure of certain medical information by electronic transmission. I acknowledge that I have been given the opportunity to request an in-person assessment or other available alternative prior to the  telemedicine visit and am voluntarily participating in the telemedicine visit.  I understand that I have the right to withhold or withdraw my consent to the use of telemedicine in the course of my care at any time, without affecting my right to future care or treatment, and that the Practitioner or I may terminate the telemedicine visit at any time. I understand that I have the right to inspect all information obtained and/or recorded in the course of the telemedicine visit and may receive copies of available information for a reasonable fee.  I understand that some of the potential risks of receiving the Services via telemedicine include:  Marland Kitchen Delay or interruption in medical evaluation due to technological equipment failure or disruption; . Information transmitted may not be sufficient (e.g. poor resolution of images) to allow for appropriate medical decision making by the Practitioner; and/or  . In rare instances, security protocols could fail, causing a breach of personal health information.  Furthermore, I acknowledge that it is my responsibility to provide information about my medical history, conditions and care that is complete and accurate to the best of my ability. I acknowledge that Practitioner's advice, recommendations, and/or decision may be based on factors not within their control, such as incomplete or inaccurate data provided by me or distortions of diagnostic images or specimens that may result from electronic transmissions. I understand that the practice of medicine is not an exact science and that Practitioner makes no warranties or guarantees regarding treatment outcomes. I acknowledge that I will receive a copy of this consent concurrently upon execution via email to the email address I last provided but may also request a printed copy by calling the office of [place].    I understand that my insurance will be billed for this visit.   I have read or had this consent read to  me. . I understand the contents of this consent, which adequately explains the benefits and risks of the Services being provided via telemedicine.  . I have been provided ample opportunity to ask questions regarding this consent and the Services and have had my questions answered to my satisfaction. . I give my informed consent for the services to be provided through the use of telemedicine in my medical care  By participating in this telemedicine visit I agree to the above.

## 2019-02-19 ENCOUNTER — Telehealth: Payer: Self-pay | Admitting: Cardiovascular Disease

## 2019-02-19 NOTE — Telephone Encounter (Signed)
Spoke with patient regarding upcoming appointment.  She can only use her home phone for her visit.  She does not have a smart phone or computer.  She does not have an e-mail address and cannot use My Chart. I confirmed all demographics.

## 2019-02-20 ENCOUNTER — Encounter: Payer: Self-pay | Admitting: Cardiovascular Disease

## 2019-02-21 ENCOUNTER — Other Ambulatory Visit: Payer: Self-pay

## 2019-02-21 ENCOUNTER — Encounter: Payer: Self-pay | Admitting: Cardiovascular Disease

## 2019-02-21 ENCOUNTER — Telehealth (INDEPENDENT_AMBULATORY_CARE_PROVIDER_SITE_OTHER): Payer: Medicare Other | Admitting: Cardiovascular Disease

## 2019-02-21 VITALS — Ht 63.0 in | Wt 154.0 lb

## 2019-02-21 DIAGNOSIS — E782 Mixed hyperlipidemia: Secondary | ICD-10-CM | POA: Diagnosis not present

## 2019-02-21 DIAGNOSIS — I2510 Atherosclerotic heart disease of native coronary artery without angina pectoris: Secondary | ICD-10-CM | POA: Diagnosis not present

## 2019-02-21 DIAGNOSIS — E7849 Other hyperlipidemia: Secondary | ICD-10-CM

## 2019-02-21 MED ORDER — ROSUVASTATIN CALCIUM 10 MG PO TABS: 10.0000 mg | ORAL_TABLET | Freq: Every day | ORAL | 3 refills | Status: DC

## 2019-02-21 NOTE — Progress Notes (Signed)
Virtual Visit via Telephone Note    Evaluation Performed:  Follow-up visit  This visit type was conducted due to national recommendations for restrictions regarding the COVID-19 Pandemic (e.g. social distancing).  This format is felt to be most appropriate for this patient at this time.  All issues noted in this document were discussed and addressed.  No physical exam was performed (except for noted visual exam findings with Video Visits).  Please refer to the patient's chart (MyChart message for video visits and phone note for telephone visits) for the patient's consent to telehealth for [facility].  Date:  02/21/2019   ID:  [name], DOB Mar 24, 1941, MRN [id number]  Patient Location:  Home   Provider location:    [facility] , [place], [place]   PCP:  Renato Shin, MD  Cardiologist:  Mertie Moores, MD , previous [facility] patient  Electrophysiologist:  None   Chief Complaint:  CAD, hyperilipidemia ,   February 21, 2019   [name] is a 78 y.o. female who presents via audio/video conferencing for a telehealth visit today.   Hodan has a hx of CAD , s/p CABG.     I saw her a year ago Still has dyspnea,   Sees pulmonary,  No fever, no cough,  No CP  Was not able to give [place] a BP or HR today  Able to do her usual housework - has not been out much with the pollen and with th Covid 19 outbreak.  The patient does not have symptoms concerning for COVID-19 infection (fever, chills, cough, or new shortness of breath).   Has had a kidney transplant,  Seen Dr. Jimmy Footman.     Prior CV studies:   The following studies were reviewed today:    Past Medical History:  Diagnosis Date  . 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 Asthma   . Coronary artery disease    cath 11/2007, grafts patent /  Nuclear, June, 2011, prior inferior MI with mild peri-infarct ischemia, anterior breast  attenuation, EF 67%, done for renal transplant assessment / Low level exercise/Lexiscan Myoview (11/2013): EF 67%, no ischemi; normal study  . Diabetes mellitus    type 2  . ESRD on dialysis [facility])    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, 2012  . Family history of breast cancer   . Family history of prostate cancer   . GERD (gastroesophageal reflux disease)   . GI bleed 11/26/2010   January, 2012 , AVM  . Gout   . History of methicillin resistant staphylococcus aureus (MRSA)   . Hyperlipidemia   . Hyperparathyroidism   . Hypertension   . Myocardial infarction ([place])   . Osteoporosis   . Pneumonia 08/2010    Hospitalization, October, 2011  . Primary osteoarthritis, 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  . S/P kidney transplant    September, 2012, [facility]  . Subacromial impingement of 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   Past Surgical History:  Procedure Laterality Date  . ABDOMINAL HYSTERECTOMY  1980'S  . ARTERIOVENOUS GRAFT PLACEMENT Left 03/04/2007   forearm  . BREAST EXCISIONAL BIOPSY    . BREAST SURGERY  YRS AGO   RT BREAST CYST REMOVED   . CARDIAC CATHETERIZATION  06/03/2002; 12/04/2007  . COLONOSCOPY WITH PROPOFOL N/A 07/14/2014   Procedure: COLONOSCOPY WITH PROPOFOL;  Surgeon: Lear Ng, MD;  Location: [facility] END[facility];  Service: Endoscopy;  Laterality: N/A;  .  CORONARY ARTERY BYPASS GRAFT  06/06/2002   x 4  . HOT HEMOSTASIS N/A 07/14/2014   Procedure: HOT HEMOSTASIS (ARGON PLASMA COAGULATION/BICAP);  Surgeon: Lear Ng, MD;  Location: [facility];  Service: Endoscopy;  Laterality: N/A;  . KIDNEY TRANSPLANT Right 08/04/2011  . ORIF PATELLA Left 04/06/2016   Procedure: OPEN REDUCTION INTERNAL (ORIF) LEFT  PATELLA;  Surgeon: Ninetta Lights, MD;  Location: [place];  Service: Orthopedics;  Laterality: Left;  . SHOULDER ARTHROSCOPY WITH ROTATOR CUFF REPAIR AND SUBACROMIAL DECOMPRESSION Left 08/03/2016   Procedure: LEFT SHOULDER ARTHROSCOPY WITH ROTATOR CUFF REPAIR AND SUBACROMIAL  DECOMPRESSION with distal claviculectomy and extentsive debridement;  Surgeon: Ninetta Lights, MD;  Location: [place];  Service: Orthopedics;  Laterality: Left;  Block  . THROMBECTOMY AND REVISION OF ARTERIOVENTOUS (AV) GORETEX  GRAFT Left 05/08/2007   forearm  . TOTAL HIP ARTHROPLASTY  12/18/2012   Procedure: TOTAL HIP ARTHROPLASTY;  Surgeon: Ninetta Lights, MD;  Location: [place];  Service: Orthopedics;  Laterality: Left;  Marland Kitchen VESICOVAGINAL FISTULA CLOSURE W/ TAH  1984     Current Meds  Medication Sig  . amLODipine (NORVASC) 10 MG tablet Take 10 mg by mouth every morning.   Marland Kitchen aspirin EC 81 MG tablet Take 81 mg by mouth daily.  Marland Kitchen azaTHIOprine (IMURAN) 50 MG tablet Take 50 mg by mouth daily.  . Azelastine HCl 0.15 % SOLN Place 1 spray into the nose 2 (two) times daily.  . calcitRIOL (ROCALTROL) 0.25 MCG capsule Take 0.25 mcg by mouth daily.   . ferrous sulfate 325 (65 FE) MG tablet Take 325 mg by mouth daily with breakfast.  . fluticasone (FLONASE) 50 MCG/ACT nasal spray Place 1 spray into both nostrils 2 (two) times daily.  . fluticasone furoate-vilanterol (BREO ELLIPTA) 200-25 MCG/INH AEPB Inhale 1 puff into the lungs daily.  . nitroGLYCERIN (NITROSTAT) 0.4 MG SL tablet Place 1 tablet (0.4 mg total) under the tongue every 5 (five) minutes as needed for chest pain.  Marland Kitchen omeprazole (PRILOSEC) 20 MG capsule Take 20 mg by mouth daily.   . predniSONE (DELTASONE) 5 MG tablet Take 1 tablet (5 mg total) by mouth daily.  . sennosides-docusate sodium (SENOKOT-S) 8.6-50 MG tablet Take 1 tablet by mouth daily.  . tacrolimus (PROGRAF) 1 MG capsule Take 1 mg by mouth 2 (two) times daily. Pt takes 5 capsules (5mg ) in the morning at 9 am and 6 capsules(6mg ) at 9 pm  . Tiotropium Bromide Monohydrate (SPIRIVA RESPIMAT) 1.25 MCG/ACT AERS Inhale 1 puff into the lungs daily.  Marland Kitchen triamterene-hydrochlorothiazide (MAXZIDE-25) 37.5-25 MG tablet Take 1 tablet by mouth daily.     Allergies:   Lactose;  Asa arthritis strength-antacid [aspirin buffered]; Aspirin; Atorvastatin; Banana; Lactose intolerance (gi); and Lisinopril   Social History   Tobacco Use  . Smoking status: Former Smoker    Packs/day: 1.00    Years: 8.00    Pack years: 8.00    Types: Cigarettes    Last attempt to quit: 11/21/1983    Years since quitting: 35.2  . Smokeless tobacco: Never Used  Substance Use Topics  . Alcohol use: No  . Drug use: No     Family Hx: The patient's family history includes Breast cancer (age of onset: 65) in her sister; Lung cancer in her brother; Prostate cancer in her brother. There is no history of Cancer.  ROS:   Please see the history of present illness.     All other systems reviewed and  are negative.   Labs/Other Tests and Data Reviewed:    Recent Labs: 06/05/2018: ALT 8; BUN 14; Creatinine, Ser 1.05; Potassium 3.5; Sodium 143   Recent Lipid Panel Lab Results  Component Value Date/Time   CHOL 171 06/05/2018 08:26 AM   TRIG 125 06/05/2018 08:26 AM   HDL 71 06/05/2018 08:26 AM   CHOLHDL 2.4 06/05/2018 08:26 AM   CHOLHDL 2.6 02/14/2018 02:23 AM   LDLCALC 75 06/05/2018 08:26 AM   LDLDIRECT 136.3 02/26/2012 05:15 PM    Wt Readings from Last 3 Encounters:  02/21/19 154 lb (69.9 kg)  02/14/19 155 lb (70.3 kg)  12/16/18 152 lb (68.9 kg)     Objective:    Vital Signs:  Ht 5\' 3"  (1.6 m)   Wt 154 lb (69.9 kg)   BMI 27.28 kg/m      ASSESSMENT & PLAN:    1.  CAD:  Pt is doing well . No angina   2,  Hyperlipidemia:   Last lipids look good .  LDL is 75.   3.  Kidney transplant- follow up with Dr. Jimmy Footman  4.  Emphasema:  Follows with pulmonary   5.    COVID-19 Education: The signs and symptoms of COVID-19 were discussed with the patient and how to seek care for testing (follow up with PCP or arrange E-visit).  The importance of social distancing was discussed today.  Patient Risk:   After full review of this patient's clinical status, I feel that they are  at least moderate risk at this time.  Time:   Today, I have spent  13  minutes with the patient with telehealth technology discussing  CAD, staying safe from COVID 19.   Lipids .     Medication Adjustments/Labs and Tests Ordered: Current medicines are reviewed at length with the patient today.  Concerns regarding medicines are outlined above.  Tests Ordered: No orders of the defined types were placed in this encounter.  Medication Changes: No orders of the defined types were placed in this encounter.   Disposition:  Follow up in 1 year(s)  Signed, Mertie Moores, MD  02/21/2019 9:37 AM    [facility]

## 2019-02-21 NOTE — Patient Instructions (Signed)

## 2019-02-24 ENCOUNTER — Other Ambulatory Visit: Payer: Self-pay | Admitting: Endocrinology

## 2019-02-24 DIAGNOSIS — Z1231 Encounter for screening mammogram for malignant neoplasm of breast: Secondary | ICD-10-CM

## 2019-03-04 ENCOUNTER — Ambulatory Visit: Payer: Medicare Other | Admitting: Cardiovascular Disease

## 2019-04-25 ENCOUNTER — Other Ambulatory Visit: Payer: Self-pay

## 2019-04-25 ENCOUNTER — Ambulatory Visit
Admission: RE | Admit: 2019-04-25 | Discharge: 2019-04-25 | Disposition: A | Discharge status: Home / Self Care | Payer: Medicare Other | Source: Ambulatory Visit | Attending: Endocrinology | Admitting: Endocrinology

## 2019-04-25 DIAGNOSIS — Z1231 Encounter for screening mammogram for malignant neoplasm of breast: Secondary | ICD-10-CM

## 2019-05-09 DIAGNOSIS — N189 Chronic kidney disease, unspecified: Secondary | ICD-10-CM | POA: Diagnosis not present

## 2019-05-09 DIAGNOSIS — N2581 Secondary hyperparathyroidism of renal origin: Secondary | ICD-10-CM | POA: Diagnosis not present

## 2019-05-09 DIAGNOSIS — E1122 Type 2 diabetes mellitus with diabetic chronic kidney disease: Secondary | ICD-10-CM | POA: Diagnosis not present

## 2019-05-09 DIAGNOSIS — D631 Anemia in chronic kidney disease: Secondary | ICD-10-CM | POA: Diagnosis not present

## 2019-05-09 DIAGNOSIS — N183 Chronic kidney disease, stage 3 (moderate): Secondary | ICD-10-CM | POA: Diagnosis not present

## 2019-05-09 DIAGNOSIS — K210 Gastro-esophageal reflux disease with esophagitis: Secondary | ICD-10-CM | POA: Diagnosis not present

## 2019-05-09 DIAGNOSIS — Z940 Kidney transplant status: Secondary | ICD-10-CM | POA: Diagnosis not present

## 2019-05-09 DIAGNOSIS — E785 Hyperlipidemia, unspecified: Secondary | ICD-10-CM | POA: Diagnosis not present

## 2019-05-09 DIAGNOSIS — K31811 Angiodysplasia of stomach and duodenum with bleeding: Secondary | ICD-10-CM | POA: Diagnosis not present

## 2019-05-09 DIAGNOSIS — I25810 Atherosclerosis of coronary artery bypass graft(s) without angina pectoris: Secondary | ICD-10-CM | POA: Diagnosis not present

## 2019-05-09 DIAGNOSIS — I129 Hypertensive chronic kidney disease with stage 1 through stage 4 chronic kidney disease, or unspecified chronic kidney disease: Secondary | ICD-10-CM | POA: Diagnosis not present

## 2019-05-09 DIAGNOSIS — M109 Gout, unspecified: Secondary | ICD-10-CM | POA: Diagnosis not present

## 2019-05-16 DIAGNOSIS — J449 Chronic obstructive pulmonary disease, unspecified: Secondary | ICD-10-CM | POA: Diagnosis not present

## 2019-05-16 DIAGNOSIS — J9611 Chronic respiratory failure with hypoxia: Secondary | ICD-10-CM | POA: Diagnosis not present

## 2019-05-16 DIAGNOSIS — J432 Centrilobular emphysema: Secondary | ICD-10-CM | POA: Diagnosis not present

## 2019-05-19 ENCOUNTER — Ambulatory Visit: Payer: Medicare Other | Admitting: Endocrinology

## 2019-05-26 DIAGNOSIS — J9611 Chronic respiratory failure with hypoxia: Secondary | ICD-10-CM | POA: Diagnosis not present

## 2019-05-26 DIAGNOSIS — J449 Chronic obstructive pulmonary disease, unspecified: Secondary | ICD-10-CM | POA: Diagnosis not present

## 2019-05-26 DIAGNOSIS — J432 Centrilobular emphysema: Secondary | ICD-10-CM | POA: Diagnosis not present

## 2019-05-30 ENCOUNTER — Other Ambulatory Visit: Payer: Self-pay

## 2019-06-03 ENCOUNTER — Ambulatory Visit (INDEPENDENT_AMBULATORY_CARE_PROVIDER_SITE_OTHER): Payer: Medicare Other | Admitting: Endocrinology

## 2019-06-03 ENCOUNTER — Encounter: Payer: Self-pay | Admitting: Endocrinology

## 2019-06-03 ENCOUNTER — Other Ambulatory Visit: Payer: Self-pay

## 2019-06-03 VITALS — BP 144/70 | HR 91 | Ht 63.0 in | Wt 161.2 lb

## 2019-06-03 DIAGNOSIS — Z794 Long term (current) use of insulin: Secondary | ICD-10-CM | POA: Diagnosis not present

## 2019-06-03 DIAGNOSIS — N183 Chronic kidney disease, stage 3 unspecified: Secondary | ICD-10-CM

## 2019-06-03 DIAGNOSIS — I2510 Atherosclerotic heart disease of native coronary artery without angina pectoris: Secondary | ICD-10-CM

## 2019-06-03 DIAGNOSIS — E1122 Type 2 diabetes mellitus with diabetic chronic kidney disease: Secondary | ICD-10-CM | POA: Diagnosis not present

## 2019-06-03 LAB — POCT GLYCOSYLATED HEMOGLOBIN (HGB A1C): Hemoglobin A1C: 5.9 % — AB (ref 4.0–5.6)

## 2019-06-03 NOTE — Patient Instructions (Addendum)
Your blood pressure is slightly high today.  Please continue to follow this up with Dr Deterding.  check your blood sugar once a day.  vary the time of day when you check, between before the 3 meals, and at bedtime.  also check if you have symptoms of your blood sugar being too high or too low.  please keep a record of the readings and bring it to your next appointment here (or you can bring the meter itself).  You can write it on any piece of paper.  please call us sooner if your blood sugar goes below 70, or if you have a lot of readings over 200.   No medication is needed for the diabetes now.  However, please call if the prednisone is increased.   Please come back for a follow-up appointment in 3-4 months.

## 2019-06-03 NOTE — Progress Notes (Signed)
Subjective:    Patient ID: [name], female    DOB: 08/16/1941, 78 y.o.   MRN: [id number]  HPI Pt returns for f/u of diabetes mellitus: DM type: 2 Dx'ed: 3734 Complications: CAD, PAD (seen on CT), and renal failure (transplant 2013).   Therapy: no medication now GDM: never DKA: never Severe hypoglycemia: never Pancreatitis: never Pancreatic imaging: normal on 2015 CT Other: she has never been on insulin; she intermittently takes steroids for COPD; she requests generic meds only.   Interval history: pt says cbg's are well-controlled.   pt states she feels well in general, except for chronic sob.  No recent change in low prednisone dosage.   Past Medical History:  Diagnosis Date  . 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 Asthma   . Coronary artery disease    cath 11/2007, grafts patent /  Nuclear, June, 2011, prior inferior MI with mild peri-infarct ischemia, anterior breast attenuation, EF 67%, done for renal transplant assessment / Low level exercise/Lexiscan Myoview (11/2013): EF 67%, no ischemi; normal study  . Diabetes mellitus    type 2  . ESRD on dialysis [facility])    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, 2012  . Family history of breast cancer   . Family history of prostate cancer   . GERD (gastroesophageal reflux disease)   . GI bleed 11/26/2010   January, 2012 , AVM  . Gout   . History of methicillin resistant staphylococcus aureus (MRSA)   . Hyperlipidemia   . Hyperparathyroidism   . Hypertension   . Myocardial infarction ([place])   . Osteoporosis   . Pneumonia 08/2010    Hospitalization, October, 2011  . Primary osteoarthritis, 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  . S/P kidney transplant    September, 2012, [facility]  . Subacromial impingement of 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    Past Surgical History:  Procedure Laterality Date  . ABDOMINAL HYSTERECTOMY  1980'S  . ARTERIOVENOUS GRAFT PLACEMENT Left 03/04/2007    forearm  . BREAST EXCISIONAL BIOPSY Right    benign more than 10 yr ago  . BREAST SURGERY  YRS AGO   RT BREAST CYST REMOVED   . CARDIAC CATHETERIZATION  06/03/2002; 12/04/2007  . COLONOSCOPY WITH PROPOFOL N/A 07/14/2014   Procedure: COLONOSCOPY WITH PROPOFOL;  Surgeon: Lear Ng, MD;  Location: [facility] ENDOSCOPY;  Service: Endoscopy;  Laterality: N/A;  . CORONARY ARTERY BYPASS GRAFT  06/06/2002   x 4  . HOT HEMOSTASIS N/A 07/14/2014   Procedure: HOT HEMOSTASIS (ARGON PLASMA COAGULATION/BICAP);  Surgeon: Lear Ng, MD;  Location: [facility];  Service: Endoscopy;  Laterality: N/A;  . KIDNEY TRANSPLANT Right 08/04/2011  . ORIF PATELLA Left 04/06/2016   Procedure: OPEN REDUCTION INTERNAL (ORIF) LEFT  PATELLA;  Surgeon: Ninetta Lights, MD;  Location: [place];  Service: Orthopedics;  Laterality: Left;  . SHOULDER ARTHROSCOPY WITH ROTATOR CUFF REPAIR AND SUBACROMIAL DECOMPRESSION Left 08/03/2016   Procedure: LEFT SHOULDER ARTHROSCOPY WITH ROTATOR CUFF REPAIR AND SUBACROMIAL DECOMPRESSION with distal claviculectomy and extentsive debridement;  Surgeon: Ninetta Lights, MD;  Location: [place];  Service: Orthopedics;  Laterality: Left;  Block  . THROMBECTOMY AND REVISION OF ARTERIOVENTOUS (AV) GORETEX  GRAFT Left 05/08/2007   forearm  . TOTAL HIP ARTHROPLASTY  12/18/2012   Procedure: TOTAL HIP ARTHROPLASTY;  Surgeon: Ninetta Lights, MD;  Location: [place];  Service: Orthopedics;  Laterality: Left;  .  VESICOVAGINAL FISTULA CLOSURE W/ TAH  1984    Social History   Socioeconomic History  . Marital status: Divorced    Spouse name: Not on file  . Number of children: Not on file  . Years of education: Not on file  . Highest education level: Not on file  Occupational History  . Not on file  Social Needs  . Financial resource strain: Not on file  . Food insecurity    Worry: Not on file    Inability: Not on file  . Transportation needs    Medical:  Not on file    Non-medical: Not on file  Tobacco Use  . Smoking status: Former Smoker    Packs/day: 1.00    Years: 8.00    Pack years: 8.00    Types: Cigarettes    Quit date: 11/21/1983    Years since quitting: 35.5  . Smokeless tobacco: Never Used  Substance and Sexual Activity  . Alcohol use: No  . Drug use: No  . Sexual activity: Not on file  Lifestyle  . Physical activity    Days per week: Not on file    Minutes per session: Not on file  . Stress: Not on file  Relationships  . Social Herbalist on phone: Not on file    Gets together: Not on file    Attends religious service: Not on file    Active member of club or organization: Not on file    Attends meetings of clubs or organizations: Not on file    Relationship status: Not on file  . Intimate partner violence    Fear of current or ex partner: Not on file    Emotionally abused: Not on file    Physically abused: Not on file    Forced sexual activity: Not on file  Other Topics Concern  . Not on file  Social History Narrative  . Not on file    Current Outpatient Medications on File Prior to Visit  Medication Sig Dispense Refill  . amLODipine (NORVASC) 10 MG tablet Take 10 mg by mouth every morning.     Marland Kitchen aspirin EC 81 MG tablet Take 81 mg by mouth daily.    Marland Kitchen azaTHIOprine (IMURAN) 50 MG tablet Take 50 mg by mouth daily.    . Azelastine HCl 0.15 % SOLN Place 1 spray into the nose 2 (two) times daily.    . calcitRIOL (ROCALTROL) 0.25 MCG capsule Take 0.25 mcg by mouth daily.     . ferrous sulfate 325 (65 FE) MG tablet Take 325 mg by mouth daily with breakfast.    . fluticasone (FLONASE) 50 MCG/ACT nasal spray Place 1 spray into both nostrils 2 (two) times daily.    . fluticasone furoate-vilanterol (BREO ELLIPTA) 200-25 MCG/INH AEPB Inhale 1 puff into the lungs daily.    . nitroGLYCERIN (NITROSTAT) 0.4 MG SL tablet Place 1 tablet (0.4 mg total) under the tongue every 5 (five) minutes as needed for chest pain.  25 tablet 1  . omeprazole (PRILOSEC) 20 MG capsule Take 20 mg by mouth daily.     . predniSONE (DELTASONE) 5 MG tablet Take 1 tablet (5 mg total) by mouth daily.    . rosuvastatin (CRESTOR) 10 MG tablet Take 1 tablet (10 mg total) by mouth daily. 90 tablet 3  . sennosides-docusate sodium (SENOKOT-S) 8.6-50 MG tablet Take 1 tablet by mouth daily.    . tacrolimus (PROGRAF) 1 MG capsule Take 1 mg by mouth  2 (two) times daily. Pt takes 5 capsules (5mg ) in the morning at 9 am and 6 capsules(6mg ) at 9 pm    . Tiotropium Bromide Monohydrate (SPIRIVA RESPIMAT) 1.25 MCG/ACT AERS Inhale 1 puff into the lungs daily.    Marland Kitchen triamterene-hydrochlorothiazide (MAXZIDE-25) 37.5-25 MG tablet Take 1 tablet by mouth daily. 30 tablet 0   No current facility-administered medications on file prior to visit.     Allergies  Allergen Reactions  . Lactose Other (See Comments)  . Asa Arthritis Strength-Antacid [Aspirin Buffered] Other (See Comments)    STOMACH BURNS, N/V   . Aspirin Nausea And Vomiting    Per pt. "can tolerate the enteric coated tablets".   . Atorvastatin Other (See Comments)    Patient's skin was skin was sensitive  . Banana Nausea And Vomiting  . Lactose Intolerance (Gi) Nausea And Vomiting  . Lisinopril Cough    Family History  Problem Relation Age of Onset  . Breast cancer Sister 49  . Prostate cancer Brother   . Lung cancer Brother   . Cancer Neg Hx     BP (!) 144/70 (BP Location: Right Arm, Patient Position: Sitting, Cuff Size: Large)   Pulse 91   Ht 5\' 3"  (1.6 m)   Wt 161 lb 3.2 oz (73.1 kg)   SpO2 94%   BMI 28.56 kg/m    Review of Systems She denies hypoglycemia.      Objective:   Physical Exam VITAL SIGNS:  See vs page GENERAL: no distress.  She has 02 on Pulses: dorsalis pedis intact bilat.   MSK: no deformity of the feet CV: trace bilat leg edema Skin:  no ulcer on the feet.  normal color and temp on the feet.  Neuro: sensation is intact to touch on the feet.   Ext: there is bilateral onychomycosis of the toenails.   Lab Results  Component Value Date   CREATININE 1.05 (H) 06/05/2018   BUN 14 06/05/2018   NA 143 06/05/2018   K 3.5 06/05/2018   CL 102 06/05/2018   CO2 25 06/05/2018    Lab Results  Component Value Date   HGBA1C 5.9 (A) 06/03/2019       Assessment & Plan:  HTN: is noted today.  Type 2 DM: stable off rx COPD: she will need close f/u of DM, if steroid rx is increased  Patient Instructions  Your blood pressure is slightly high today.  Please continue to follow this up with Dr Deterding.  check your blood sugar once a day.  vary the time of day when you check, between before the 3 meals, and at bedtime.  also check if you have symptoms of your blood sugar being too high or too low.  please keep a record of the readings and bring it to your next appointment here (or you can bring the meter itself).  You can write it on any piece of paper.  please call us sooner if your blood sugar goes below 70, or if you have a lot of readings over 200.   No medication is needed for the diabetes now.  However, please call if the prednisone is increased.   Please come back for a follow-up appointment in 3-4 months.

## 2019-08-13 DIAGNOSIS — Z940 Kidney transplant status: Secondary | ICD-10-CM | POA: Diagnosis not present

## 2019-08-13 DIAGNOSIS — Z23 Encounter for immunization: Secondary | ICD-10-CM | POA: Diagnosis not present

## 2019-08-13 DIAGNOSIS — N2581 Secondary hyperparathyroidism of renal origin: Secondary | ICD-10-CM | POA: Diagnosis not present

## 2019-08-13 DIAGNOSIS — N183 Chronic kidney disease, stage 3 (moderate): Secondary | ICD-10-CM | POA: Diagnosis not present

## 2019-08-13 DIAGNOSIS — I129 Hypertensive chronic kidney disease with stage 1 through stage 4 chronic kidney disease, or unspecified chronic kidney disease: Secondary | ICD-10-CM | POA: Diagnosis not present

## 2019-08-13 DIAGNOSIS — E1122 Type 2 diabetes mellitus with diabetic chronic kidney disease: Secondary | ICD-10-CM | POA: Diagnosis not present

## 2019-08-13 DIAGNOSIS — E785 Hyperlipidemia, unspecified: Secondary | ICD-10-CM | POA: Diagnosis not present

## 2019-08-13 DIAGNOSIS — K31811 Angiodysplasia of stomach and duodenum with bleeding: Secondary | ICD-10-CM | POA: Diagnosis not present

## 2019-08-13 DIAGNOSIS — D631 Anemia in chronic kidney disease: Secondary | ICD-10-CM | POA: Diagnosis not present

## 2019-08-13 DIAGNOSIS — I25810 Atherosclerosis of coronary artery bypass graft(s) without angina pectoris: Secondary | ICD-10-CM | POA: Diagnosis not present

## 2019-08-13 DIAGNOSIS — M1009 Idiopathic gout, multiple sites: Secondary | ICD-10-CM | POA: Diagnosis not present

## 2019-08-19 DIAGNOSIS — N183 Chronic kidney disease, stage 3 (moderate): Secondary | ICD-10-CM | POA: Diagnosis not present

## 2019-09-04 ENCOUNTER — Other Ambulatory Visit: Payer: Self-pay | Admitting: Gastroenterology

## 2019-09-04 DIAGNOSIS — Z86010 Personal history of colonic polyps: Secondary | ICD-10-CM | POA: Diagnosis not present

## 2019-09-25 ENCOUNTER — Other Ambulatory Visit: Payer: Self-pay | Admitting: Gastroenterology

## 2019-09-26 ENCOUNTER — Other Ambulatory Visit (HOSPITAL_COMMUNITY)
Admission: RE | Admit: 2019-09-26 | Discharge: 2019-09-26 | Disposition: A | Discharge status: Home / Self Care | Payer: Medicare Other | Source: Ambulatory Visit | Attending: Gastroenterology | Admitting: Gastroenterology

## 2019-09-26 DIAGNOSIS — Z20828 Contact with and (suspected) exposure to other viral communicable diseases: Secondary | ICD-10-CM | POA: Insufficient documentation

## 2019-09-27 LAB — NOVEL CORONAVIRUS, NAA (HOSP ORDER, SEND-OUT TO REF LAB; TAT 18-24 HRS): SARS-CoV-2, NAA: NOT DETECTED

## 2019-09-29 ENCOUNTER — Other Ambulatory Visit: Payer: Self-pay

## 2019-09-29 DIAGNOSIS — D849 Immunodeficiency, unspecified: Secondary | ICD-10-CM | POA: Diagnosis not present

## 2019-09-29 DIAGNOSIS — Z940 Kidney transplant status: Secondary | ICD-10-CM | POA: Diagnosis not present

## 2019-09-29 DIAGNOSIS — N181 Chronic kidney disease, stage 1: Secondary | ICD-10-CM | POA: Diagnosis not present

## 2019-09-29 DIAGNOSIS — E1122 Type 2 diabetes mellitus with diabetic chronic kidney disease: Secondary | ICD-10-CM | POA: Diagnosis not present

## 2019-09-30 ENCOUNTER — Ambulatory Visit (HOSPITAL_COMMUNITY): Payer: Medicare Other | Admitting: Anesthesiology

## 2019-09-30 ENCOUNTER — Encounter (HOSPITAL_COMMUNITY)
Admission: RE | Disposition: A | Discharge status: Home / Self Care | Payer: Self-pay | Source: Home / Self Care | Attending: Gastroenterology

## 2019-09-30 ENCOUNTER — Encounter (HOSPITAL_COMMUNITY): Payer: Self-pay | Admitting: *Deleted

## 2019-09-30 ENCOUNTER — Ambulatory Visit (HOSPITAL_COMMUNITY)
Admission: RE | Admit: 2019-09-30 | Discharge: 2019-09-30 | Disposition: A | Discharge status: Home / Self Care | Payer: Medicare Other | Attending: Gastroenterology | Admitting: Gastroenterology

## 2019-09-30 ENCOUNTER — Other Ambulatory Visit: Payer: Self-pay

## 2019-09-30 DIAGNOSIS — Z96642 Presence of left artificial hip joint: Secondary | ICD-10-CM | POA: Diagnosis not present

## 2019-09-30 DIAGNOSIS — D649 Anemia, unspecified: Secondary | ICD-10-CM | POA: Diagnosis not present

## 2019-09-30 DIAGNOSIS — Z7982 Long term (current) use of aspirin: Secondary | ICD-10-CM | POA: Insufficient documentation

## 2019-09-30 DIAGNOSIS — D120 Benign neoplasm of cecum: Secondary | ICD-10-CM | POA: Insufficient documentation

## 2019-09-30 DIAGNOSIS — Z90710 Acquired absence of both cervix and uterus: Secondary | ICD-10-CM | POA: Insufficient documentation

## 2019-09-30 DIAGNOSIS — Z833 Family history of diabetes mellitus: Secondary | ICD-10-CM | POA: Insufficient documentation

## 2019-09-30 DIAGNOSIS — Z860100 Personal history of colon polyps, unspecified: Secondary | ICD-10-CM

## 2019-09-30 DIAGNOSIS — D125 Benign neoplasm of sigmoid colon: Secondary | ICD-10-CM | POA: Insufficient documentation

## 2019-09-30 DIAGNOSIS — Z7984 Long term (current) use of oral hypoglycemic drugs: Secondary | ICD-10-CM | POA: Insufficient documentation

## 2019-09-30 DIAGNOSIS — M1990 Unspecified osteoarthritis, unspecified site: Secondary | ICD-10-CM | POA: Diagnosis not present

## 2019-09-30 DIAGNOSIS — K219 Gastro-esophageal reflux disease without esophagitis: Secondary | ICD-10-CM | POA: Insufficient documentation

## 2019-09-30 DIAGNOSIS — E119 Type 2 diabetes mellitus without complications: Secondary | ICD-10-CM | POA: Insufficient documentation

## 2019-09-30 DIAGNOSIS — Z940 Kidney transplant status: Secondary | ICD-10-CM | POA: Insufficient documentation

## 2019-09-30 DIAGNOSIS — I2510 Atherosclerotic heart disease of native coronary artery without angina pectoris: Secondary | ICD-10-CM | POA: Diagnosis not present

## 2019-09-30 DIAGNOSIS — D123 Benign neoplasm of transverse colon: Secondary | ICD-10-CM | POA: Diagnosis not present

## 2019-09-30 DIAGNOSIS — I10 Essential (primary) hypertension: Secondary | ICD-10-CM | POA: Insufficient documentation

## 2019-09-30 DIAGNOSIS — Z79899 Other long term (current) drug therapy: Secondary | ICD-10-CM | POA: Insufficient documentation

## 2019-09-30 DIAGNOSIS — Z886 Allergy status to analgesic agent status: Secondary | ICD-10-CM | POA: Insufficient documentation

## 2019-09-30 DIAGNOSIS — I252 Old myocardial infarction: Secondary | ICD-10-CM | POA: Insufficient documentation

## 2019-09-30 DIAGNOSIS — J449 Chronic obstructive pulmonary disease, unspecified: Secondary | ICD-10-CM | POA: Insufficient documentation

## 2019-09-30 DIAGNOSIS — Z951 Presence of aortocoronary bypass graft: Secondary | ICD-10-CM | POA: Diagnosis not present

## 2019-09-30 DIAGNOSIS — Z888 Allergy status to other drugs, medicaments and biological substances status: Secondary | ICD-10-CM | POA: Diagnosis not present

## 2019-09-30 DIAGNOSIS — M109 Gout, unspecified: Secondary | ICD-10-CM | POA: Insufficient documentation

## 2019-09-30 DIAGNOSIS — Z87891 Personal history of nicotine dependence: Secondary | ICD-10-CM | POA: Diagnosis not present

## 2019-09-30 DIAGNOSIS — K635 Polyp of colon: Secondary | ICD-10-CM | POA: Diagnosis not present

## 2019-09-30 DIAGNOSIS — D126 Benign neoplasm of colon, unspecified: Secondary | ICD-10-CM | POA: Diagnosis not present

## 2019-09-30 DIAGNOSIS — K6389 Other specified diseases of intestine: Secondary | ICD-10-CM | POA: Diagnosis not present

## 2019-09-30 DIAGNOSIS — Z91018 Allergy to other foods: Secondary | ICD-10-CM | POA: Insufficient documentation

## 2019-09-30 DIAGNOSIS — J45909 Unspecified asthma, uncomplicated: Secondary | ICD-10-CM | POA: Insufficient documentation

## 2019-09-30 DIAGNOSIS — K640 First degree hemorrhoids: Secondary | ICD-10-CM | POA: Insufficient documentation

## 2019-09-30 DIAGNOSIS — Z1211 Encounter for screening for malignant neoplasm of colon: Secondary | ICD-10-CM | POA: Insufficient documentation

## 2019-09-30 DIAGNOSIS — Z91011 Allergy to milk products: Secondary | ICD-10-CM | POA: Insufficient documentation

## 2019-09-30 DIAGNOSIS — K922 Gastrointestinal hemorrhage, unspecified: Secondary | ICD-10-CM | POA: Diagnosis not present

## 2019-09-30 DIAGNOSIS — Z7951 Long term (current) use of inhaled steroids: Secondary | ICD-10-CM | POA: Insufficient documentation

## 2019-09-30 HISTORY — PX: POLYPECTOMY: SHX5525

## 2019-09-30 HISTORY — PX: COLONOSCOPY WITH PROPOFOL: SHX5780

## 2019-09-30 HISTORY — PX: BIOPSY: SHX5522

## 2019-09-30 SURGERY — COLONOSCOPY WITH PROPOFOL
Anesthesia: Monitor Anesthesia Care

## 2019-09-30 MED ORDER — PROPOFOL 500 MG/50ML IV EMUL
INTRAVENOUS | Status: AC
  Filled 2019-09-30: qty 50

## 2019-09-30 MED ORDER — PROPOFOL 10 MG/ML IV BOLUS
INTRAVENOUS | Status: DC | PRN
  Administered 2019-09-30: 10 mg via INTRAVENOUS
  Administered 2019-09-30: 20 mg via INTRAVENOUS

## 2019-09-30 MED ORDER — SODIUM CHLORIDE 0.9 % IV SOLN
INTRAVENOUS | Status: DC
  Administered 2019-09-30: 09:00:00 via INTRAVENOUS

## 2019-09-30 MED ORDER — LACTATED RINGERS IV SOLN: INTRAVENOUS | Status: DC

## 2019-09-30 MED ORDER — PROPOFOL 500 MG/50ML IV EMUL
INTRAVENOUS | Status: DC | PRN
  Administered 2019-09-30: 140 ug/kg/min via INTRAVENOUS

## 2019-09-30 SURGICAL SUPPLY — 22 items

## 2019-09-30 NOTE — Anesthesia Postprocedure Evaluation (Signed)
Anesthesia Post Note  Patient: [name]  Procedure(s) Performed: COLONOSCOPY WITH PROPOFOL (N/A ) POLYPECTOMY BIOPSY     Patient location during evaluation: PACU Anesthesia Type: MAC Level of consciousness: awake and alert Pain management: pain level controlled Vital Signs Assessment: post-procedure vital signs reviewed and stable Respiratory status: spontaneous breathing, nonlabored ventilation, respiratory function stable and patient connected to nasal cannula oxygen Cardiovascular status: stable and blood pressure returned to baseline Postop Assessment: no apparent nausea or vomiting Anesthetic complications: no    Last Vitals:  Vitals:   09/30/19 1100 09/30/19 1105  BP: (!) 144/58 (!) 156/55  Pulse: 76 76  Resp: (!) 22 14  Temp:    SpO2: 100% 100%    Last Pain:  Vitals:   09/30/19 1055  TempSrc:   PainSc: 0-No pain                 Effie Berkshire

## 2019-09-30 NOTE — H&P (Signed)
Date of Initial H&P: 09/04/19  History reviewed, patient examined, no change in status, stable for surgery.

## 2019-09-30 NOTE — Anesthesia Preprocedure Evaluation (Addendum)
Anesthesia Evaluation  Patient identified by MRN, date of birth, ID band Patient awake    Reviewed: Allergy & Precautions, NPO status , Patient's Chart, lab work & pertinent test results  History of Anesthesia Complications Negative for: history of anesthetic complications  Airway Mallampati: I  TM Distance: >3 FB Neck ROM: Full    Dental  (+) Upper Dentures, Lower Dentures   Pulmonary asthma , former smoker,    breath sounds clear to auscultation       Cardiovascular hypertension, Pt. on medications + CAD, + Past MI and + CABG   Rhythm:Regular Rate:Normal   '19 TTE - moderate LVH. EF 60% to 65%. Grade 1 diastolic dysfunction. AV sclerosis without stenosis. Trivial AI and MR. RA was mildly dilated. PFO cannot be excluded.    Neuro/Psych negative neurological ROS  negative psych ROS   GI/Hepatic Neg liver ROS, GERD  Medicated,  Endo/Other  diabetes  Renal/GU Renal disease S/p renal transplant      Musculoskeletal  (+) Arthritis , Osteoarthritis,   Gout    Abdominal Normal abdominal exam  (+)   Peds  Hematology negative hematology ROS (+)   Anesthesia Other Findings   Reproductive/Obstetrics                            Anesthesia Physical Anesthesia Plan  ASA: III  Anesthesia Plan: MAC   Post-op Pain Management:    Induction: Intravenous  PONV Risk Score and Plan: 2 and Propofol infusion and Treatment may vary due to age or medical condition  Airway Management Planned: Nasal Cannula and Natural Airway  Additional Equipment: None  Intra-op Plan:   Post-operative Plan:   Informed Consent: I have reviewed the patients History and Physical, chart, labs and discussed the procedure including the risks, benefits and alternatives for the proposed anesthesia with the patient or authorized representative who has indicated his/her understanding and acceptance.       Plan  Discussed with: CRNA and Anesthesiologist  Anesthesia Plan Comments:        Anesthesia Quick Evaluation

## 2019-09-30 NOTE — Discharge Instructions (Signed)
Avoid Ibuprofen products for the next 2 weeks.

## 2019-09-30 NOTE — Op Note (Signed)
[facility] Patient Name: [name] Procedure Date: 09/30/2019 MRN: [id number] Attending MD: Lear Ng , MD Date of Birth: December 04, 1940 CSN: [id number] Age: 78 Admit Type: Outpatient Procedure:                Colonoscopy Indications:              High risk colon cancer surveillance: Personal                            history of colonic polyps, Last colonoscopy:                            October 2018 Providers:                Lear Ng, MD, Cleda Daub, RN, Janie                            Billups, Technician, Courtney Heys. [place], CRNA Referring MD:             Jacelyn Pi [name], MD Medicines:                Propofol per Anesthesia, Monitored Anesthesia Care Complications:            No immediate complications. Estimated Blood Loss:     Estimated blood loss was minimal. Procedure:                Pre-Anesthesia Assessment:                           - Prior to the procedure, a History and Physical                            was performed, and patient medications and                            allergies were reviewed. The patient's tolerance of                            previous anesthesia was also reviewed. The risks                            and benefits of the procedure and the sedation                            options and risks were discussed with the patient.                            All questions were answered, and informed consent                            was obtained. Prior Anticoagulants: The patient has                            taken no previous anticoagulant or antiplatelet  agents. ASA Grade Assessment: III - A patient with                            severe systemic disease. After reviewing the risks                            and benefits, the patient was deemed in                            satisfactory condition to undergo the procedure.                           After obtaining informed consent, the  colonoscope                            was passed under direct vision. Throughout the                            procedure, the patient's blood pressure, pulse, and                            oxygen saturations were monitored continuously. The                            [id number] Olympus pediatric colonscope                            was introduced through the anus and advanced to the                            the cecum, identified by appendiceal orifice and                            ileocecal valve. The colonoscopy was performed                            without difficulty. The patient tolerated the                            procedure well. The quality of the bowel                            preparation was adequate and good. The terminal                            ileum, the appendiceal orifice and the rectum were                            photographed. Scope In: 10:27:25 AM Scope Out: 10:47:46 AM Scope Withdrawal Time: 0 hours 13 minutes 55 seconds  Total Procedure Duration: 0 hours 20 minutes 21 seconds  Findings:      The perianal and digital rectal examinations were normal.      A 4 mm polyp was found in the transverse colon.  The polyp was sessile.       The polyp was removed with a hot snare. Resection and retrieval were       complete. Estimated blood loss: none.      Two flat and semi-sessile polyps were found in the sigmoid colon. The       polyps were 1 to 3 mm in size. These polyps were removed with a cold       biopsy forceps. Resection and retrieval were complete. Estimated blood       loss was minimal.      A segmental area of nodular mucosa was found in the cecum. Biopsies were       taken with a cold forceps for histology. Estimated blood loss was       minimal.      Internal hemorrhoids were found during retroflexion. The hemorrhoids       were medium-sized and Grade I (internal hemorrhoids that do not       prolapse).      The terminal ileum  appeared normal. Impression:               - One 4 mm polyp in the transverse colon, removed                            with a hot snare. Resected and retrieved.                           - Two 1 to 3 mm polyps in the sigmoid colon,                            removed with a cold biopsy forceps. Resected and                            retrieved.                           - Nodular mucosa in the cecum. Biopsied.                           - Internal hemorrhoids.                           - The examined portion of the ileum was normal. Moderate Sedation:      Not Applicable - Patient had care per Anesthesia. Recommendation:           - Patient has a contact number available for                            emergencies. The signs and symptoms of potential                            delayed complications were discussed with the                            patient. Return to normal activities tomorrow.  Written discharge instructions were provided to the                            patient.                           - High fiber diet.                           - Await pathology results.                           - No ibuprofen, naproxen, or other non-steroidal                            anti-inflammatory drugs for 2 weeks.                           - Repeat colonoscopy for surveillance based on                            pathology results. Procedure Code(s):        --- Professional ---                           [phone], Colonoscopy, flexible; with removal of                            tumor(s), polyp(s), or other lesion(s) by snare                            technique                           45380, 57, Colonoscopy, flexible; with biopsy,                            single or multiple Diagnosis Code(s):        --- Professional ---                           Z86.010, Personal history of colonic polyps                           K63.5, Polyp of colon                            K63.89, Other specified diseases of intestine                           K64.0, First degree hemorrhoids CPT copyright 2019 [employer]. All rights reserved. The codes documented in this report are preliminary and upon coder review may  be revised to meet current compliance requirements. Lear Ng, MD 09/30/2019 10:58:09 AM This report has been signed electronically. Number of Addenda: 0

## 2019-09-30 NOTE — Interval H&P Note (Signed)
History and Physical Interval Note:  09/30/2019 10:06 AM  [name]  has presented today for surgery, with the diagnosis of History of colon polyps.  The various methods of treatment have been discussed with the patient and family. After consideration of risks, benefits and other options for treatment, the patient has consented to  Procedure(s): COLONOSCOPY WITH PROPOFOL (N/A) as a surgical intervention.  The patient's history has been reviewed, patient examined, no change in status, stable for surgery.  I have reviewed the patient's chart and labs.  Questions were answered to the patient's satisfaction.     Lear Ng

## 2019-09-30 NOTE — Transfer of Care (Signed)
Immediate Anesthesia Transfer of Care Note  Patient: [name]  Procedure(s) Performed: COLONOSCOPY WITH PROPOFOL (N/A ) POLYPECTOMY BIOPSY  Patient Location: PACU and Endoscopy Unit  Anesthesia Type:MAC  Level of Consciousness: awake, alert , oriented and patient cooperative  Airway & Oxygen Therapy: Patient Spontanous Breathing and Patient connected to face mask oxygen  Post-op Assessment: Report given to RN, Post -op Vital signs reviewed and stable and Patient moving all extremities  Post vital signs: Reviewed and stable  Last Vitals:  Vitals Value Taken Time  BP 135/52 09/30/19 1055  Temp    Pulse 81 09/30/19 1056  Resp 20 09/30/19 1056  SpO2 100 % 09/30/19 1056  Vitals shown include unvalidated device data.  Last Pain:  Vitals:   09/30/19 0855  TempSrc: Oral  PainSc: 0-No pain         Complications: No apparent anesthesia complications

## 2019-10-02 ENCOUNTER — Other Ambulatory Visit: Payer: Self-pay

## 2019-10-02 ENCOUNTER — Encounter: Payer: Self-pay | Admitting: Endocrinology

## 2019-10-02 ENCOUNTER — Ambulatory Visit (INDEPENDENT_AMBULATORY_CARE_PROVIDER_SITE_OTHER): Payer: Medicare Other | Admitting: Endocrinology

## 2019-10-02 VITALS — BP 162/88 | HR 95 | Ht 63.0 in | Wt 163.6 lb

## 2019-10-02 DIAGNOSIS — I2510 Atherosclerotic heart disease of native coronary artery without angina pectoris: Secondary | ICD-10-CM | POA: Diagnosis not present

## 2019-10-02 DIAGNOSIS — N1831 Chronic kidney disease, stage 3a: Secondary | ICD-10-CM | POA: Diagnosis not present

## 2019-10-02 DIAGNOSIS — E119 Type 2 diabetes mellitus without complications: Secondary | ICD-10-CM | POA: Diagnosis not present

## 2019-10-02 DIAGNOSIS — Z794 Long term (current) use of insulin: Secondary | ICD-10-CM | POA: Diagnosis not present

## 2019-10-02 DIAGNOSIS — E1121 Type 2 diabetes mellitus with diabetic nephropathy: Secondary | ICD-10-CM

## 2019-10-02 LAB — POCT GLYCOSYLATED HEMOGLOBIN (HGB A1C): Hemoglobin A1C: 5.9 % — AB (ref 4.0–5.6)

## 2019-10-02 LAB — SURGICAL PATHOLOGY

## 2019-10-02 NOTE — Progress Notes (Signed)
Subjective:    Patient ID: [name], female    DOB: 1941-04-16, 78 y.o.   MRN: [id number]  HPI Pt returns for f/u of diabetes mellitus: DM type: 2 Dx'ed: 0000000 Complications: CAD, PAD (seen on CT), and renal failure (transplant 2013).   Therapy: no medication now GDM: never DKA: never Severe hypoglycemia: never Pancreatitis: never Pancreatic imaging: normal on 2015 CT Other: she has never been on insulin; she intermittently takes steroids for COPD; she requests generic meds only.   Interval history: pt says cbg's are in the low-100's.  pt states she feels well in general, except for chronic sob.  No recent change in low prednisone dosage.   Past Medical History:  Diagnosis Date  . 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 Asthma   . Coronary artery disease    cath 11/2007, grafts patent /  Nuclear, June, 2011, prior inferior MI with mild peri-infarct ischemia, anterior breast attenuation, EF 67%, done for renal transplant assessment / Low level exercise/Lexiscan Myoview (11/2013): EF 67%, no ischemi; normal study  . Diabetes mellitus    type 2  . ESRD on dialysis [facility])    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, 2012  . Family history of breast cancer   . Family history of prostate cancer   . GERD (gastroesophageal reflux disease)   . GI bleed 11/26/2010   January, 2012 , AVM  . Gout   . History of methicillin resistant staphylococcus aureus (MRSA)   . Hyperlipidemia   . Hyperparathyroidism   . Hypertension   . Myocardial infarction ([place])   . Osteoporosis   . Pneumonia 08/2010    Hospitalization, October, 2011  . Primary osteoarthritis, 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  . S/P kidney transplant    September, 2012, [facility]  . Subacromial impingement of 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    Past Surgical History:  Procedure Laterality Date  . ABDOMINAL HYSTERECTOMY  1980'S  . ARTERIOVENOUS GRAFT PLACEMENT Left 03/04/2007    forearm  . BIOPSY  09/30/2019   Procedure: BIOPSY;  Surgeon: Wilford Corner, MD;  Location: [facility];  Service: Endoscopy;;  . BREAST EXCISIONAL BIOPSY Right    benign more than 10 yr ago  . BREAST SURGERY  YRS AGO   RT BREAST CYST REMOVED   . CARDIAC CATHETERIZATION  06/03/2002; 12/04/2007  . COLONOSCOPY WITH PROPOFOL N/A 07/14/2014   Procedure: COLONOSCOPY WITH PROPOFOL;  Surgeon: Lear Ng, MD;  [facility];  Service: Endoscopy;  Laterality: N/A;  . COLONOSCOPY WITH PROPOFOL N/A 09/30/2019   Procedure: COLONOSCOPY WITH PROPOFOL;  Surgeon: Wilford Corner, MD;  [facility];  Service: Endoscopy;  Laterality: N/A;  . CORONARY ARTERY BYPASS GRAFT  06/06/2002   x 4  . HOT HEMOSTASIS N/A 07/14/2014   Procedure: HOT HEMOSTASIS (ARGON PLASMA COAGULATION/BICAP);  Surgeon: Lear Ng, MD;  Location: [facility];  Service: Endoscopy;  Laterality: N/A;  . KIDNEY TRANSPLANT Right 08/04/2011  . ORIF PATELLA Left 04/06/2016   Procedure: OPEN REDUCTION INTERNAL (ORIF) LEFT  PATELLA;  Surgeon: Ninetta Lights, MD;  Location: [place];  Service: Orthopedics;  Laterality: Left;  . POLYPECTOMY  09/30/2019   Procedure: POLYPECTOMY;  Surgeon: Wilford Corner, MD;  [facility];  Service: Endoscopy;;  . SHOULDER ARTHROSCOPY WITH ROTATOR CUFF REPAIR AND SUBACROMIAL DECOMPRESSION Left 08/03/2016   Procedure: LEFT SHOULDER ARTHROSCOPY WITH ROTATOR CUFF REPAIR AND SUBACROMIAL DECOMPRESSION  with distal claviculectomy and extentsive debridement;  Surgeon: Ninetta Lights, MD;  Location: [place];  Service: Orthopedics;  Laterality: Left;  Block  . THROMBECTOMY AND REVISION OF ARTERIOVENTOUS (AV) GORETEX  GRAFT Left 05/08/2007   forearm  . TOTAL HIP ARTHROPLASTY  12/18/2012   Procedure: TOTAL HIP ARTHROPLASTY;  Surgeon: Ninetta Lights, MD;  Location: [place];  Service: Orthopedics;  Laterality: Left;  Marland Kitchen VESICOVAGINAL FISTULA  CLOSURE W/ TAH  1984    Social History   Socioeconomic History  . Marital status: Divorced    Spouse name: Not on file  . Number of children: Not on file  . Years of education: Not on file  . Highest education level: Not on file  Occupational History  . Not on file  Social Needs  . Financial resource strain: Not on file  . Food insecurity    Worry: Not on file    Inability: Not on file  . Transportation needs    Medical: Not on file    Non-medical: Not on file  Tobacco Use  . Smoking status: Former Smoker    Packs/day: 1.00    Years: 8.00    Pack years: 8.00    Types: Cigarettes    Quit date: 11/21/1983    Years since quitting: 35.8  . Smokeless tobacco: Never Used  Substance and Sexual Activity  . Alcohol use: No  . Drug use: No  . Sexual activity: Not on file  Lifestyle  . Physical activity    Days per week: Not on file    Minutes per session: Not on file  . Stress: Not on file  Relationships  . Social Herbalist on phone: Not on file    Gets together: Not on file    Attends religious service: Not on file    Active member of club or organization: Not on file    Attends meetings of clubs or organizations: Not on file    Relationship status: Not on file  . Intimate partner violence    Fear of current or ex partner: Not on file    Emotionally abused: Not on file    Physically abused: Not on file    Forced sexual activity: Not on file  Other Topics Concern  . Not on file  Social History Narrative  . Not on file    Current Outpatient Medications on File Prior to Visit  Medication Sig Dispense Refill  . albuterol (PROVENTIL) (2.5 MG/3ML) 0.083% nebulizer solution Take 2.5 mg by nebulization every 6 (six) hours as needed for wheezing or shortness of breath.    Marland Kitchen amLODipine (NORVASC) 10 MG tablet Take 10 mg by mouth daily.     Marland Kitchen aspirin EC 81 MG tablet Take 81 mg by mouth daily.    Marland Kitchen azaTHIOprine (IMURAN) 50 MG tablet Take 50 mg by mouth daily.    .  calcitRIOL (ROCALTROL) 0.25 MCG capsule Take 0.25 mcg by mouth daily.     . fluticasone furoate-vilanterol (BREO ELLIPTA) 200-25 MCG/INH AEPB Inhale 1 puff into the lungs daily as needed (respiratory issues.).     Marland Kitchen nitroGLYCERIN (NITROSTAT) 0.4 MG SL tablet Place 1 tablet (0.4 mg total) under the tongue every 5 (five) minutes as needed for chest pain. 25 tablet 1  . omeprazole (PRILOSEC) 20 MG capsule Take 20 mg by mouth daily.     . predniSONE (DELTASONE) 5 MG tablet Take 1 tablet (5 mg total) by mouth daily.    Marland Kitchen  rosuvastatin (CRESTOR) 10 MG tablet Take 1 tablet (10 mg total) by mouth daily. (Patient taking differently: Take 10 mg by mouth every evening. ) 90 tablet 3  . sennosides-docusate sodium (SENOKOT-S) 8.6-50 MG tablet Take 1 tablet by mouth daily as needed (constipation.).     Marland Kitchen tacrolimus (PROGRAF) 1 MG capsule Take 5 mg by mouth 2 (two) times daily. (0900 & 2100)    . Tiotropium Bromide Monohydrate (SPIRIVA RESPIMAT) 1.25 MCG/ACT AERS Inhale 1 puff into the lungs daily.     No current facility-administered medications on file prior to visit.     Allergies  Allergen Reactions  . Lactose Other (See Comments)  . Asa Arthritis Strength-Antacid [Aspirin Buffered] Other (See Comments)    STOMACH BURNS, N/V   . Aspirin Nausea And Vomiting    Per pt. "can tolerate the enteric coated tablets".   . Atorvastatin Other (See Comments)    Patient's skin was skin was sensitive  . Banana Nausea And Vomiting  . Lactose Intolerance (Gi) Nausea And Vomiting  . Lisinopril Cough    Family History  Problem Relation Age of Onset  . Breast cancer Sister 56  . Prostate cancer Brother   . Lung cancer Brother   . Cancer Neg Hx     BP (!) 162/88 (BP Location: Right Arm, Patient Position: Sitting, Cuff Size: Large)   Pulse 95   Ht 5\' 3"  (1.6 m)   Wt 163 lb 9.6 oz (74.2 kg)   SpO2 98%   BMI 28.98 kg/m    Review of Systems She denies hypoglycemia    Objective:   Physical Exam VITAL  SIGNS:  See vs page GENERAL: no distress.  Has 02 on Pulses: dorsalis pedis intact bilat.   MSK: no deformity of the feet.  CV: no leg edema.   Skin:  no ulcer on the feet.  normal color and temp on the feet.   Neuro: sensation is intact to touch on the feet.   Ext: there is bilateral onychomycosis of the toenails.   Lab Results  Component Value Date   HGBA1C 5.9 (A) 10/02/2019       Assessment & Plan:  Type 2 DM, with PAD: stable off rx Abnormality of glucose-hemoglobin interaction, due to renal failure.  Fructosamine is a more reliable indicator of glycemic control.  However, lab is not available now, so we'll check fructosamine next time.   HTN: is noted today.    Patient Instructions  Your blood pressure is slightly high today.  Please continue to follow this up with Dr Deterding.  check your blood sugar once a day.  vary the time of day when you check, between before the 3 meals, and at bedtime.  also check if you have symptoms of your blood sugar being too high or too low.  please keep a record of the readings and bring it to your next appointment here (or you can bring the meter itself).  You can write it on any piece of paper.  please call us sooner if your blood sugar goes below 70, or if you have a lot of readings over 200.   No medication is needed for the diabetes now.  However, please call if the prednisone is increased.   Please come back for a follow-up appointment in 4 months.

## 2019-10-02 NOTE — Patient Instructions (Addendum)
Your blood pressure is slightly high today.  Please continue to follow this up with Dr Deterding.  check your blood sugar once a day.  vary the time of day when you check, between before the 3 meals, and at bedtime.  also check if you have symptoms of your blood sugar being too high or too low.  please keep a record of the readings and bring it to your next appointment here (or you can bring the meter itself).  You can write it on any piece of paper.  please call us sooner if your blood sugar goes below 70, or if you have a lot of readings over 200.   No medication is needed for the diabetes now.  However, please call if the prednisone is increased.   Please come back for a follow-up appointment in 4 months.

## 2019-10-07 DIAGNOSIS — H04123 Dry eye syndrome of bilateral lacrimal glands: Secondary | ICD-10-CM | POA: Diagnosis not present

## 2019-10-07 DIAGNOSIS — E119 Type 2 diabetes mellitus without complications: Secondary | ICD-10-CM | POA: Diagnosis not present

## 2019-10-07 DIAGNOSIS — Z961 Presence of intraocular lens: Secondary | ICD-10-CM | POA: Diagnosis not present

## 2019-10-07 LAB — HM DIABETES EYE EXAM

## 2019-10-08 DIAGNOSIS — Z940 Kidney transplant status: Secondary | ICD-10-CM | POA: Diagnosis not present

## 2019-10-08 DIAGNOSIS — Z5181 Encounter for therapeutic drug level monitoring: Secondary | ICD-10-CM | POA: Diagnosis not present

## 2019-10-08 DIAGNOSIS — N390 Urinary tract infection, site not specified: Secondary | ICD-10-CM | POA: Diagnosis not present

## 2019-12-09 ENCOUNTER — Telehealth (HOSPITAL_COMMUNITY): Payer: Self-pay

## 2019-12-18 ENCOUNTER — Emergency Department (HOSPITAL_COMMUNITY): Payer: Medicare HMO

## 2019-12-18 ENCOUNTER — Encounter (HOSPITAL_COMMUNITY): Payer: Self-pay | Admitting: Emergency Medicine

## 2019-12-18 ENCOUNTER — Inpatient Hospital Stay (HOSPITAL_COMMUNITY): Payer: Medicare HMO

## 2019-12-18 ENCOUNTER — Other Ambulatory Visit: Payer: Self-pay

## 2019-12-18 ENCOUNTER — Inpatient Hospital Stay (HOSPITAL_COMMUNITY)
Admission: EM | Admit: 2019-12-18 | Discharge: 2019-12-27 | DRG: 315 | Disposition: A | Discharge status: Home Health | Payer: Medicare HMO | Attending: Internal Medicine | Admitting: Internal Medicine

## 2019-12-18 DIAGNOSIS — Z803 Family history of malignant neoplasm of breast: Secondary | ICD-10-CM

## 2019-12-18 DIAGNOSIS — Z801 Family history of malignant neoplasm of trachea, bronchus and lung: Secondary | ICD-10-CM | POA: Diagnosis not present

## 2019-12-18 DIAGNOSIS — J4550 Severe persistent asthma, uncomplicated: Secondary | ICD-10-CM | POA: Diagnosis not present

## 2019-12-18 DIAGNOSIS — R509 Fever, unspecified: Secondary | ICD-10-CM | POA: Insufficient documentation

## 2019-12-18 DIAGNOSIS — I252 Old myocardial infarction: Secondary | ICD-10-CM

## 2019-12-18 DIAGNOSIS — E739 Lactose intolerance, unspecified: Secondary | ICD-10-CM | POA: Diagnosis present

## 2019-12-18 DIAGNOSIS — K219 Gastro-esophageal reflux disease without esophagitis: Secondary | ICD-10-CM | POA: Diagnosis present

## 2019-12-18 DIAGNOSIS — J45909 Unspecified asthma, uncomplicated: Secondary | ICD-10-CM | POA: Diagnosis present

## 2019-12-18 DIAGNOSIS — K5520 Angiodysplasia of colon without hemorrhage: Secondary | ICD-10-CM | POA: Diagnosis present

## 2019-12-18 DIAGNOSIS — Z20822 Contact with and (suspected) exposure to covid-19: Secondary | ICD-10-CM | POA: Diagnosis present

## 2019-12-18 DIAGNOSIS — I10 Essential (primary) hypertension: Secondary | ICD-10-CM | POA: Diagnosis present

## 2019-12-18 DIAGNOSIS — M810 Age-related osteoporosis without current pathological fracture: Secondary | ICD-10-CM | POA: Diagnosis present

## 2019-12-18 DIAGNOSIS — J4489 Other specified chronic obstructive pulmonary disease: Secondary | ICD-10-CM | POA: Diagnosis present

## 2019-12-18 DIAGNOSIS — M109 Gout, unspecified: Secondary | ICD-10-CM | POA: Diagnosis present

## 2019-12-18 DIAGNOSIS — Z96642 Presence of left artificial hip joint: Secondary | ICD-10-CM | POA: Diagnosis present

## 2019-12-18 DIAGNOSIS — K5900 Constipation, unspecified: Secondary | ICD-10-CM | POA: Diagnosis present

## 2019-12-18 DIAGNOSIS — M7021 Olecranon bursitis, right elbow: Secondary | ICD-10-CM | POA: Diagnosis not present

## 2019-12-18 DIAGNOSIS — T80212A Local infection due to central venous catheter, initial encounter: Secondary | ICD-10-CM | POA: Diagnosis not present

## 2019-12-18 DIAGNOSIS — L03113 Cellulitis of right upper limb: Secondary | ICD-10-CM | POA: Diagnosis not present

## 2019-12-18 DIAGNOSIS — Z789 Other specified health status: Secondary | ICD-10-CM

## 2019-12-18 DIAGNOSIS — D8481 Immunodeficiency due to conditions classified elsewhere: Secondary | ICD-10-CM | POA: Diagnosis present

## 2019-12-18 DIAGNOSIS — M19012 Primary osteoarthritis, left shoulder: Secondary | ICD-10-CM | POA: Diagnosis present

## 2019-12-18 DIAGNOSIS — M7989 Other specified soft tissue disorders: Secondary | ICD-10-CM | POA: Diagnosis present

## 2019-12-18 DIAGNOSIS — Z8042 Family history of malignant neoplasm of prostate: Secondary | ICD-10-CM

## 2019-12-18 DIAGNOSIS — Z940 Kidney transplant status: Secondary | ICD-10-CM

## 2019-12-18 DIAGNOSIS — I2510 Atherosclerotic heart disease of native coronary artery without angina pectoris: Secondary | ICD-10-CM | POA: Diagnosis present

## 2019-12-18 DIAGNOSIS — Z951 Presence of aortocoronary bypass graft: Secondary | ICD-10-CM | POA: Diagnosis not present

## 2019-12-18 DIAGNOSIS — E213 Hyperparathyroidism, unspecified: Secondary | ICD-10-CM | POA: Diagnosis present

## 2019-12-18 DIAGNOSIS — Z87891 Personal history of nicotine dependence: Secondary | ICD-10-CM

## 2019-12-18 DIAGNOSIS — E785 Hyperlipidemia, unspecified: Secondary | ICD-10-CM | POA: Diagnosis present

## 2019-12-18 DIAGNOSIS — R5081 Fever presenting with conditions classified elsewhere: Secondary | ICD-10-CM

## 2019-12-18 DIAGNOSIS — Z8614 Personal history of Methicillin resistant Staphylococcus aureus infection: Secondary | ICD-10-CM

## 2019-12-18 DIAGNOSIS — D649 Anemia, unspecified: Secondary | ICD-10-CM | POA: Diagnosis present

## 2019-12-18 DIAGNOSIS — I959 Hypotension, unspecified: Secondary | ICD-10-CM | POA: Diagnosis present

## 2019-12-18 DIAGNOSIS — Z7952 Long term (current) use of systemic steroids: Secondary | ICD-10-CM

## 2019-12-18 DIAGNOSIS — E119 Type 2 diabetes mellitus without complications: Secondary | ICD-10-CM | POA: Diagnosis present

## 2019-12-18 DIAGNOSIS — Z888 Allergy status to other drugs, medicaments and biological substances status: Secondary | ICD-10-CM | POA: Diagnosis not present

## 2019-12-18 DIAGNOSIS — L03114 Cellulitis of left upper limb: Secondary | ICD-10-CM | POA: Diagnosis not present

## 2019-12-18 LAB — COMPREHENSIVE METABOLIC PANEL
ALT: 12 U/L (ref 0–44)
AST: 19 U/L (ref 15–41)
Albumin: 3.9 g/dL (ref 3.5–5.0)
Alkaline Phosphatase: 46 U/L (ref 38–126)
Anion gap: 10 (ref 5–15)
BUN: 13 mg/dL (ref 8–23)
CO2: 27 mmol/L (ref 22–32)
Calcium: 9.2 mg/dL (ref 8.9–10.3)
Chloride: 102 mmol/L (ref 98–111)
Creatinine, Ser: 1.18 mg/dL — ABNORMAL HIGH (ref 0.44–1.00)
GFR calc Af Amer: 52 mL/min — ABNORMAL LOW (ref >60)
GFR calc non Af Amer: 45 mL/min — ABNORMAL LOW (ref >60)
Glucose, Bld: 172 mg/dL — ABNORMAL HIGH (ref 70–99)
Potassium: 3.8 mmol/L (ref 3.5–5.1)
Sodium: 139 mmol/L (ref 135–145)
Total Bilirubin: 1.2 mg/dL (ref 0.3–1.2)
Total Protein: 6.6 g/dL (ref 6.5–8.1)

## 2019-12-18 LAB — C-REACTIVE PROTEIN: CRP: 12.7 mg/dL — ABNORMAL HIGH (ref <1.0)

## 2019-12-18 LAB — LACTIC ACID, PLASMA
Lactic Acid, Venous: 1.8 mmol/L (ref 0.5–1.9)
Lactic Acid, Venous: 1.9 mmol/L (ref 0.5–1.9)

## 2019-12-18 LAB — CBC WITH DIFFERENTIAL/PLATELET
Abs Immature Granulocytes: 0.21 10*3/uL — ABNORMAL HIGH (ref 0.00–0.07)
Basophils Absolute: 0 10*3/uL (ref 0.0–0.1)
Basophils Relative: 0 %
Eosinophils Absolute: 0 10*3/uL (ref 0.0–0.5)
Eosinophils Relative: 0 %
HCT: 40.8 % (ref 36.0–46.0)
Hemoglobin: 14 g/dL (ref 12.0–15.0)
Immature Granulocytes: 1 %
Lymphocytes Relative: 6 %
Lymphs Abs: 1.1 10*3/uL (ref 0.7–4.0)
MCH: 26.7 pg (ref 26.0–34.0)
MCHC: 34.3 g/dL (ref 30.0–36.0)
MCV: 77.9 fL — ABNORMAL LOW (ref 80.0–100.0)
Monocytes Absolute: 1.9 10*3/uL — ABNORMAL HIGH (ref 0.1–1.0)
Monocytes Relative: 10 %
Neutro Abs: 16 10*3/uL — ABNORMAL HIGH (ref 1.7–7.7)
Neutrophils Relative %: 83 %
Platelets: 129 10*3/uL — ABNORMAL LOW (ref 150–400)
RBC: 5.24 MIL/uL — ABNORMAL HIGH (ref 3.87–5.11)
RDW: 14.7 % (ref 11.5–15.5)
WBC: 19.2 10*3/uL — ABNORMAL HIGH (ref 4.0–10.5)
nRBC: 0 % (ref 0.0–0.2)

## 2019-12-18 LAB — RESPIRATORY PANEL BY RT PCR (FLU A&B, COVID)
Influenza A by PCR: NEGATIVE
Influenza B by PCR: NEGATIVE
SARS Coronavirus 2 by RT PCR: NEGATIVE

## 2019-12-18 LAB — SEDIMENTATION RATE: Sed Rate: 4 mm/hr (ref 0–22)

## 2019-12-18 LAB — MRSA PCR SCREENING: MRSA by PCR: NEGATIVE

## 2019-12-18 MED ORDER — FENTANYL CITRATE (PF) 100 MCG/2ML IJ SOLN
50.0000 ug | Freq: Once | INTRAMUSCULAR | Status: AC
  Administered 2019-12-18: 13:00:00 50 ug via INTRAVENOUS
  Filled 2019-12-18: qty 2

## 2019-12-18 MED ORDER — AMLODIPINE BESYLATE 10 MG PO TABS: 10.0000 mg | ORAL_TABLET | Freq: Every day | ORAL | Status: DC

## 2019-12-18 MED ORDER — OXYCODONE HCL 5 MG PO TABS
5.0000 mg | ORAL_TABLET | ORAL | Status: DC | PRN
  Administered 2019-12-18 – 2019-12-19 (×3): 5 mg via ORAL
  Filled 2019-12-18 (×3): qty 1

## 2019-12-18 MED ORDER — ALBUTEROL SULFATE (2.5 MG/3ML) 0.083% IN NEBU
2.5000 mg | INHALATION_SOLUTION | Freq: Four times a day (QID) | RESPIRATORY_TRACT | Status: DC | PRN
  Administered 2019-12-22: 2.5 mg via RESPIRATORY_TRACT
  Filled 2019-12-18: qty 3

## 2019-12-18 MED ORDER — VANCOMYCIN HCL 1500 MG/300ML IV SOLN
1500.0000 mg | Freq: Once | INTRAVENOUS | Status: AC
  Administered 2019-12-18: 1500 mg via INTRAVENOUS
  Filled 2019-12-18: qty 300

## 2019-12-18 MED ORDER — SODIUM CHLORIDE 0.9 % IV SOLN
2.0000 g | Freq: Once | INTRAVENOUS | Status: AC
  Administered 2019-12-18: 14:00:00 2 g via INTRAVENOUS
  Filled 2019-12-18: qty 20

## 2019-12-18 MED ORDER — VANCOMYCIN HCL 750 MG/150ML IV SOLN
750.0000 mg | INTRAVENOUS | Status: DC
  Administered 2019-12-19: 750 mg via INTRAVENOUS
  Filled 2019-12-18 (×2): qty 150

## 2019-12-18 MED ORDER — ROSUVASTATIN CALCIUM 5 MG PO TABS
10.0000 mg | ORAL_TABLET | Freq: Every evening | ORAL | Status: DC
  Administered 2019-12-18 – 2019-12-26 (×9): 10 mg via ORAL
  Filled 2019-12-18 (×9): qty 2

## 2019-12-18 MED ORDER — TACROLIMUS 1 MG PO CAPS
5.0000 mg | ORAL_CAPSULE | Freq: Two times a day (BID) | ORAL | Status: DC
  Administered 2019-12-18 – 2019-12-27 (×18): 5 mg via ORAL
  Filled 2019-12-18 (×19): qty 5

## 2019-12-18 MED ORDER — HEPARIN SODIUM (PORCINE) 5000 UNIT/ML IJ SOLN
5000.0000 [IU] | Freq: Two times a day (BID) | INTRAMUSCULAR | Status: DC
  Administered 2019-12-18 – 2019-12-21 (×7): 5000 [IU] via SUBCUTANEOUS
  Filled 2019-12-18 (×7): qty 1

## 2019-12-18 MED ORDER — AZATHIOPRINE 50 MG PO TABS: 50.0000 mg | ORAL_TABLET | Freq: Every day | ORAL | Status: DC

## 2019-12-18 MED ORDER — UMECLIDINIUM BROMIDE 62.5 MCG/INH IN AEPB
1.0000 | INHALATION_SPRAY | Freq: Every day | RESPIRATORY_TRACT | Status: DC
  Administered 2019-12-20 – 2019-12-27 (×8): 1 via RESPIRATORY_TRACT
  Filled 2019-12-18 (×2): qty 7

## 2019-12-18 MED ORDER — CALCITRIOL 0.25 MCG PO CAPS
0.2500 ug | ORAL_CAPSULE | Freq: Every day | ORAL | Status: DC
  Administered 2019-12-19 – 2019-12-27 (×9): 0.25 ug via ORAL
  Filled 2019-12-18 (×9): qty 1

## 2019-12-18 MED ORDER — FENTANYL CITRATE (PF) 100 MCG/2ML IJ SOLN
50.0000 ug | Freq: Once | INTRAMUSCULAR | Status: AC
  Administered 2019-12-18: 14:00:00 50 ug via INTRAVENOUS
  Filled 2019-12-18: qty 2

## 2019-12-18 MED ORDER — FLUTICASONE FUROATE-VILANTEROL 200-25 MCG/INH IN AEPB
1.0000 | INHALATION_SPRAY | Freq: Every day | RESPIRATORY_TRACT | Status: DC
  Administered 2019-12-20 – 2019-12-27 (×8): 1 via RESPIRATORY_TRACT
  Filled 2019-12-18: qty 28

## 2019-12-18 MED ORDER — SODIUM CHLORIDE 0.9 % IV SOLN
1.0000 g | INTRAVENOUS | Status: DC
  Administered 2019-12-19 – 2019-12-26 (×7): 1 g via INTRAVENOUS
  Filled 2019-12-18 (×2): qty 10
  Filled 2019-12-18: qty 1
  Filled 2019-12-18: qty 10
  Filled 2019-12-18 (×4): qty 1
  Filled 2019-12-18: qty 10
  Filled 2019-12-18: qty 1

## 2019-12-18 MED ORDER — NITROGLYCERIN 0.4 MG SL SUBL: 0.4000 mg | SUBLINGUAL_TABLET | SUBLINGUAL | Status: DC | PRN

## 2019-12-18 MED ORDER — LORAZEPAM 2 MG/ML IJ SOLN
0.5000 mg | Freq: Once | INTRAMUSCULAR | Status: AC
  Administered 2019-12-18: 0.5 mg via INTRAVENOUS
  Filled 2019-12-18: qty 1

## 2019-12-18 MED ORDER — ASPIRIN EC 81 MG PO TBEC
81.0000 mg | DELAYED_RELEASE_TABLET | Freq: Every day | ORAL | Status: DC
  Administered 2019-12-19 – 2019-12-23 (×5): 81 mg via ORAL
  Filled 2019-12-18 (×5): qty 1

## 2019-12-18 MED ORDER — FLUTICASONE FUROATE-VILANTEROL 200-25 MCG/INH IN AEPB
1.0000 | INHALATION_SPRAY | Freq: Every day | RESPIRATORY_TRACT | Status: DC | PRN
  Filled 2019-12-18: qty 28

## 2019-12-18 MED ORDER — PANTOPRAZOLE SODIUM 40 MG PO TBEC
40.0000 mg | DELAYED_RELEASE_TABLET | Freq: Every day | ORAL | Status: DC
  Administered 2019-12-19 – 2019-12-26 (×8): 40 mg via ORAL
  Filled 2019-12-18 (×8): qty 1

## 2019-12-18 MED ORDER — SENNOSIDES-DOCUSATE SODIUM 8.6-50 MG PO TABS
1.0000 | ORAL_TABLET | Freq: Every day | ORAL | Status: DC | PRN
  Administered 2019-12-21: 1 via ORAL
  Filled 2019-12-18: qty 1

## 2019-12-18 MED ORDER — VANCOMYCIN HCL IN DEXTROSE 1-5 GM/200ML-% IV SOLN: 1000.0000 mg | Freq: Once | INTRAVENOUS | Status: DC

## 2019-12-18 MED ORDER — TIOTROPIUM BROMIDE MONOHYDRATE 1.25 MCG/ACT IN AERS: 1.0000 | INHALATION_SPRAY | Freq: Every day | RESPIRATORY_TRACT | Status: DC

## 2019-12-18 MED ORDER — ACETAMINOPHEN 325 MG PO TABS
650.0000 mg | ORAL_TABLET | Freq: Four times a day (QID) | ORAL | Status: DC | PRN
  Administered 2019-12-18 – 2019-12-27 (×8): 650 mg via ORAL
  Filled 2019-12-18 (×8): qty 2

## 2019-12-18 MED ORDER — PREDNISONE 5 MG PO TABS
5.0000 mg | ORAL_TABLET | Freq: Every day | ORAL | Status: DC
  Administered 2019-12-19 – 2019-12-27 (×9): 5 mg via ORAL
  Filled 2019-12-18 (×9): qty 1

## 2019-12-18 NOTE — Consult Note (Signed)
Reason for Consult:Right elbow pain Referring Physician: Robyn Haber  [name] is an 79 y.o. female.  HPI: [name] comes in with a 2d hx/o right elbow pain. It started out of the blue yesterday. She denies trauma but did have a colonoscopy the day before and did have an IV in that arm. She denies chills, sweats, or prior similar e/o but did have a fever of 103 last night. She is immunocompromised from a renal transplant. She is RHD.  Past Medical History:  Diagnosis Date  . 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 Asthma   . Coronary artery disease    cath 11/2007, grafts patent /  Nuclear, June, 2011, prior inferior MI with mild peri-infarct ischemia, anterior breast attenuation, EF 67%, done for renal transplant assessment / Low level exercise/Lexiscan Myoview (11/2013): EF 67%, no ischemi; normal study  . Diabetes mellitus    type 2  . ESRD on dialysis [facility])    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, 2012  . Family history of breast cancer   . Family history of prostate cancer   . GERD (gastroesophageal reflux disease)   . GI bleed 11/26/2010   January, 2012 , AVM  . Gout   . History of methicillin resistant staphylococcus aureus (MRSA)   . Hyperlipidemia   . Hyperparathyroidism   . Hypertension   . Myocardial infarction ([place])   . Osteoporosis   . Pneumonia 08/2010    Hospitalization, October, 2011  . Primary osteoarthritis, 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  . S/P kidney transplant    September, 2012, [facility]  . Subacromial impingement of 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    Past Surgical History:  Procedure Laterality Date  . ABDOMINAL HYSTERECTOMY  1980'S  . ARTERIOVENOUS GRAFT PLACEMENT Left 03/04/2007   forearm  . BIOPSY  09/30/2019   Procedure: BIOPSY;  Surgeon: Wilford Corner, MD;  [facility];  Service: Endoscopy;;  . BREAST EXCISIONAL BIOPSY Right    benign more than 10 yr ago  . BREAST SURGERY   YRS AGO   RT BREAST CYST REMOVED   . CARDIAC CATHETERIZATION  06/03/2002; 12/04/2007  . COLONOSCOPY WITH PROPOFOL N/A 07/14/2014   Procedure: COLONOSCOPY WITH PROPOFOL;  Surgeon: Lear Ng, MD;  [facility];  Service: Endoscopy;  Laterality: N/A;  . COLONOSCOPY WITH PROPOFOL N/A 09/30/2019   Procedure: COLONOSCOPY WITH PROPOFOL;  Surgeon: Wilford Corner, MD;  [facility];  Service: Endoscopy;  Laterality: N/A;  . CORONARY ARTERY BYPASS GRAFT  06/06/2002   x 4  . HOT HEMOSTASIS N/A 07/14/2014   Procedure: HOT HEMOSTASIS (ARGON PLASMA COAGULATION/BICAP);  Surgeon: Lear Ng, MD;  Location[facility];  Service: Endoscopy;  Laterality: N/A;  . KIDNEY TRANSPLANT Right 08/04/2011  . ORIF PATELLA Left 04/06/2016   Procedure: OPEN REDUCTION INTERNAL (ORIF) LEFT  PATELLA;  Surgeon: Ninetta Lights, MD;  Location: [place];  Service: Orthopedics;  Laterality: Left;  . POLYPECTOMY  09/30/2019   Procedure: POLYPECTOMY;  Surgeon: Wilford Corner, MD;  [facility];  Service: Endoscopy;;  . SHOULDER ARTHROSCOPY WITH ROTATOR CUFF REPAIR AND SUBACROMIAL DECOMPRESSION Left 08/03/2016   Procedure: LEFT SHOULDER ARTHROSCOPY WITH ROTATOR CUFF REPAIR AND SUBACROMIAL DECOMPRESSION with distal claviculectomy and extentsive debridement;  Surgeon: Ninetta Lights, MD;  Location: [place];  Service: Orthopedics;  Laterality: Left;  Block  . THROMBECTOMY AND REVISION OF ARTERIOVENTOUS (AV) GORETEX  GRAFT Left 05/08/2007   forearm  . TOTAL HIP ARTHROPLASTY  12/18/2012   Procedure: TOTAL HIP ARTHROPLASTY;  Surgeon: Ninetta Lights, MD;  Location: [place];  Service: Orthopedics;  Laterality: Left;  Marland Kitchen VESICOVAGINAL FISTULA CLOSURE W/ TAH  1984    Family History  Problem Relation Age of Onset  . Breast cancer Sister 70  . Prostate cancer Brother   . Lung cancer Brother   . Cancer Neg Hx     Social History:  reports that she quit  smoking about 36 years ago. Her smoking use included cigarettes. She has a 8.00 pack-year smoking history. She has never used smokeless tobacco. She reports that she does not drink alcohol or use drugs.  Allergies:  Allergies  Allergen Reactions  . Lactose Other (See Comments)  . Asa Arthritis Strength-Antacid [Aspirin Buffered] Other (See Comments)    STOMACH BURNS, N/V   . Aspirin Nausea And Vomiting    Per pt. "can tolerate the enteric coated tablets".   . Atorvastatin Other (See Comments)    Patient's skin was skin was sensitive  . Banana Nausea And Vomiting  . Lactose Intolerance (Gi) Nausea And Vomiting  . Lisinopril Cough    Medications: I have reviewed the patient's current medications.  Results for orders placed or performed during the hospital encounter of 12/18/19 (from the past 48 hour(s))  Lactic acid, plasma     Status: None   Collection Time: 12/18/19 12:21 PM  Result Value Ref Range   Lactic Acid, Venous 1.8 0.5 - 1.9 mmol/L    Comment: Performed at [facility], [address], [place], [place] [address]  Comprehensive metabolic panel     Status: Abnormal   Collection Time: 12/18/19 12:21 PM  Result Value Ref Range   Sodium 139 135 - 145 mmol/L   Potassium 3.8 3.5 - 5.1 mmol/L   Chloride 102 98 - 111 mmol/L   CO2 27 22 - 32 mmol/L   Glucose, Bld 172 (H) 70 - 99 mg/dL   BUN 13 8 - 23 mg/dL   Creatinine, Ser 1.18 (H) 0.44 - 1.00 mg/dL   Calcium 9.2 8.9 - 10.3 mg/dL   Total Protein 6.6 6.5 - 8.1 g/dL   Albumin 3.9 3.5 - 5.0 g/dL   AST 19 15 - 41 U/L   ALT 12 0 - 44 U/L   Alkaline Phosphatase 46 38 - 126 U/L   Total Bilirubin 1.2 0.3 - 1.2 mg/dL   GFR calc non Af Amer 45 (L) >60 mL/min   GFR calc Af Amer 52 (L) >60 mL/min   Anion gap 10 5 - 15    Comment: Performed at [facility] [address] [place], [place] [address]  CBC with Differential     Status: Abnormal   Collection Time: 12/18/19 12:21 PM  Result Value Ref Range   WBC 19.2 (H)  4.0 - 10.5 K/uL   RBC 5.24 (H) 3.87 - 5.11 MIL/uL   Hemoglobin 14.0 12.0 - 15.0 g/dL   HCT 40.8 36.0 - 46.0 %   MCV 77.9 (L) 80.0 - 100.0 fL   MCH 26.7 26.0 - 34.0 pg   MCHC 34.3 30.0 - 36.0 g/dL   RDW 14.7 11.5 - 15.5 %   Platelets 129 (L) 150 - 400 K/uL    Comment: REPEATED TO VERIFY   nRBC 0.0 0.0 - 0.2 %   Neutrophils Relative % 83 %   Neutro Abs 16.0 (H) 1.7 - 7.7  K/uL   Lymphocytes Relative 6 %   Lymphs Abs 1.1 0.7 - 4.0 K/uL   Monocytes Relative 10 %   Monocytes Absolute 1.9 (H) 0.1 - 1.0 K/uL   Eosinophils Relative 0 %   Eosinophils Absolute 0.0 0.0 - 0.5 K/uL   Basophils Relative 0 %   Basophils Absolute 0.0 0.0 - 0.1 K/uL   Immature Granulocytes 1 %   Abs Immature Granulocytes 0.21 (H) 0.00 - 0.07 K/uL    Comment: Performed at [facility] [address], [place], [place] [address]  C-reactive protein     Status: Abnormal   Collection Time: 12/18/19 12:21 PM  Result Value Ref Range   CRP 12.7 (H) <1.0 mg/dL    Comment: Performed at [facility] [address], [place], [place] [address]    DG Elbow Complete Right  Result Date: 12/18/2019 CLINICAL DATA:  Right elbow swelling for 2 days. Fever. EXAM: RIGHT ELBOW - COMPLETE 3+ VIEW COMPARISON:  None. FINDINGS: The bones of the right elbow appear normal. No joint effusion. There is subcutaneous edema around the elbow including posterior to the olecranon process. This could represent olecranon bursitis with adjacent cellulitis. IMPRESSION: Findings consistent with olecranon bursitis and adjacent cellulitis. The bones are normal. Electronically Signed   By: Lorriane Shire M.D.   On: 12/18/2019 13:30    Review of Systems  Constitutional: Positive for fever. Negative for chills and diaphoresis.  HENT: Negative for ear discharge, ear pain, hearing loss and tinnitus.   Eyes: Negative for photophobia and pain.  Respiratory: Negative for cough and shortness of breath.   Cardiovascular: Negative for chest pain.   Gastrointestinal: Negative for abdominal pain, nausea and vomiting.  Genitourinary: Negative for dysuria, flank pain, frequency and urgency.  Musculoskeletal: Positive for arthralgias (Right elbow). Negative for back pain, myalgias and neck pain.  Neurological: Negative for dizziness and headaches.  Hematological: Does not bruise/bleed easily.  Psychiatric/Behavioral: The patient is not nervous/anxious.    Blood pressure (!) 145/120, pulse (!) 111, temperature (!) 101.8 F (38.8 C), temperature source Rectal, resp. rate 18, height 5\' 3"  (1.6 m), weight 74.8 kg, SpO2 100 %. Physical Exam  Constitutional: She appears well-developed and well-nourished. No distress.  HENT:  Head: Normocephalic and atraumatic.  Eyes: Conjunctivae are normal. Right eye exhibits no discharge. Left eye exhibits no discharge. No scleral icterus.  Cardiovascular: Normal rate and regular rhythm.  Respiratory: Effort normal. No respiratory distress.  Musculoskeletal:     Cervical back: Normal range of motion.     Comments: Right shoulder, elbow, wrist, digits- no skin wounds, erythema lateral elbow/FA, mod TTP, mod pain with elbow motion but about 90 degrees of motion, no instability, no blocks to motion  Sens  Ax/R/M/U intact  Mot   Ax/ R/ PIN/ M/ AIN/ U intact  Rad 2+  Neurological: She is alert.  Skin: Skin is warm and dry. She is not diaphoretic.  Psychiatric: She has a normal mood and affect. Her behavior is normal.    Assessment/Plan: Right elbow pain -- Given lack of effusion and decent elbow ROM her picture is reassuring that this is not septic arthritis. Would recommend medical admit and IV abx.  Multiple medical problems including s/p renal transplant, asthma, CAD, DM, GERD, hx/o gout, HLD, HTN, and hyperparathyroidism    Lisette Abu, PA-C Orthopedic Surgery [phone] 12/18/2019, 1:33 PM

## 2019-12-18 NOTE — ED Provider Notes (Signed)
[place] EMERGENCY DEPARTMENT Provider Note   CSN: [id number] Arrival date & time: 12/18/19  1147     History No chief complaint on file.   [name] is a 79 y.o. female.  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  2 days ago had colonoscopy done.  Had IV and right upper extremity.  States the next day she began to develop pain in the right arm/elbow area.  Has had swelling.  States that she developed fevers up to 103 at home.  No difficulty breathing.  No cough.  No nausea vomiting or diarrhea.  Gets managed at [facility] for the transplant.    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, hold ASA 2-3 weeks - consider plavix instead of ASA   Asthma    Coronary artery disease    cath 11/2007, grafts patent /  Nuclear, June, 2011, prior inferior MI with mild peri-infarct ischemia, anterior breast attenuation, EF 67%, done for renal transplant assessment / Low level exercise/Lexiscan Myoview (11/2013): EF 67%, no ischemi; normal study   Diabetes mellitus    type 2   ESRD on dialysis [facility])    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, 2012   Family history of breast cancer    Family history of prostate cancer    GERD (gastroesophageal reflux disease)    GI bleed 11/26/2010   January, 2012 , AVM   Gout    History of methicillin resistant staphylococcus aureus (MRSA)    Hyperlipidemia    Hyperparathyroidism    Hypertension    Myocardial infarction [facility])    Osteoporosis    Pneumonia 08/2010    Hospitalization, October, 2011   Primary osteoarthritis, 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   S/P kidney transplant    September, 2012, [facility]   Subacromial impingement of 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    Patient Active Problem List   Diagnosis Date Noted   Hx of colonic polyps 09/30/2019   Family history of breast cancer    Family history of prostate cancer    Family history of lung cancer  08/08/2018   Shoulder pain, right 04/04/2018   Severe persistent asthma with (acute) exacerbation 02/13/2018   GERD (gastroesophageal reflux disease) 02/12/2018   Severe persistent asthma with acute exacerbation 02/12/2018   Complete rotator cuff tear of 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   Subacromial impingement of 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   Primary osteoarthritis, 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   History of methicillin resistant staphylococcus aureus (MRSA)    Benign neoplasm of colon 07/14/2014   Nausea & vomiting 12/22/2013   Dyspnea 12/22/2013   Diabetes ([place]) 02/28/2013   Preop cardiovascular exam    Type II or unspecified type diabetes mellitus without mention of complication, not stated as uncontrolled 06/05/2012   Encounter for long-term (current) use of other medications 02/26/2012   Type II or unspecified type diabetes mellitus with renal manifestations, not stated as uncontrolled(250.40) 02/26/2012   S/P kidney transplant    H/O steroid therapy    Anemia    Coronary artery disease    Hyperlipidemia    Hypertension    Aspirin allergy    Hx of CABG    Ejection fraction    Anemia    GI bleed 11/26/2010   Pneumonia 08/20/2010   FOOT PAIN 01/20/2010   THROMBOCYTOPENIA 01/11/2009   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   GOUT 08/12/2007   Asthma 08/12/2007   MENOPAUSAL SYNDROME 08/12/2007  Osteoporosis 08/12/2007   VERTIGO 08/12/2007    Past Surgical History:  Procedure Laterality Date   ABDOMINAL HYSTERECTOMY  1980'S   ARTERIOVENOUS GRAFT PLACEMENT Left 03/04/2007   forearm   BIOPSY  09/30/2019   Procedure: BIOPSY;  Surgeon: Wilford Corner, MD;  Location: [facility];  Service: Endoscopy;;   BREAST EXCISIONAL BIOPSY Right    benign more than 10 yr ago   BREAST SURGERY  YRS AGO   RT BREAST CYST REMOVED    CARDIAC CATHETERIZATION  06/03/2002; 12/04/2007   COLONOSCOPY WITH PROPOFOL N/A 07/14/2014   Procedure:  COLONOSCOPY WITH PROPOFOL;  Surgeon: Lear Ng, MD;  Location: [facility];  Service: Endoscopy;  Laterality: N/A;   COLONOSCOPY WITH PROPOFOL N/A 09/30/2019   Procedure: COLONOSCOPY WITH PROPOFOL;  Surgeon: Wilford Corner, MD;  Location: [facility];  Service: Endoscopy;  Laterality: N/A;   CORONARY ARTERY BYPASS GRAFT  06/06/2002   x 4   HOT HEMOSTASIS N/A 07/14/2014   Procedure: HOT HEMOSTASIS (ARGON PLASMA COAGULATION/BICAP);  Surgeon: Lear Ng, MD;  Location: [facility];  Service: Endoscopy;  Laterality: N/A;   KIDNEY TRANSPLANT Right 08/04/2011   ORIF PATELLA Left 04/06/2016   Procedure: OPEN REDUCTION INTERNAL (ORIF) LEFT  PATELLA;  Surgeon: Ninetta Lights, MD;  Location: [place];  Service: Orthopedics;  Laterality: Left;   POLYPECTOMY  09/30/2019   Procedure: POLYPECTOMY;  Surgeon: Wilford Corner, MD;  Location: [facility];  Service: Endoscopy;;   SHOULDER ARTHROSCOPY WITH ROTATOR CUFF REPAIR AND SUBACROMIAL DECOMPRESSION Left 08/03/2016   Procedure: LEFT SHOULDER ARTHROSCOPY WITH ROTATOR CUFF REPAIR AND SUBACROMIAL DECOMPRESSION with distal claviculectomy and extentsive debridement;  Surgeon: Ninetta Lights, MD;  Location: [place];  Service: Orthopedics;  Laterality: Left;  Block   THROMBECTOMY AND REVISION OF ARTERIOVENTOUS (AV) GORETEX  GRAFT Left 05/08/2007   forearm   TOTAL HIP ARTHROPLASTY  12/18/2012   Procedure: TOTAL HIP ARTHROPLASTY;  Surgeon: Ninetta Lights, MD;  Location: [place];  Service: Orthopedics;  Laterality: Left;   VESICOVAGINAL FISTULA CLOSURE W/ TAH  1984     OB History   No obstetric history on file.     Family History  Problem Relation Age of Onset   Breast cancer Sister 86   Prostate cancer Brother    Lung cancer Brother    Cancer Neg Hx     Social History   Tobacco Use   Smoking status: Former Smoker    Packs/day: 1.00    Years: 8.00    Pack years: 8.00     Types: Cigarettes    Quit date: 11/21/1983    Years since quitting: 36.0   Smokeless tobacco: Never Used  Substance Use Topics   Alcohol use: No   Drug use: No    Home Medications Prior to Admission medications   Medication Sig Start Date End Date Taking? Authorizing Provider  albuterol (PROVENTIL) (2.5 MG/3ML) 0.083% nebulizer solution Take 2.5 mg by nebulization every 6 (six) hours as needed for wheezing or shortness of breath.    [provider]  amLODipine (NORVASC) 10 MG tablet Take 10 mg by mouth daily.     [provider]  aspirin EC 81 MG tablet Take 81 mg by mouth daily.    [provider]  azaTHIOprine (IMURAN) 50 MG tablet Take 50 mg by mouth daily.    [provider]  calcitRIOL (ROCALTROL) 0.25 MCG capsule Take 0.25 mcg by mouth daily.     [provider]  fluticasone furoate-vilanterol (BREO ELLIPTA) 200-25 MCG/INH AEPB Inhale 1 puff into the lungs daily as needed (respiratory issues.).  10/23/17   [provider]  nitroGLYCERIN (NITROSTAT) 0.4 MG SL tablet Place 1 tablet (0.4 mg total) under the tongue every 5 (five) minutes as needed for chest pain. 08/19/18   Nahser, Wonda Cheng, MD  omeprazole (PRILOSEC) 20 MG capsule Take 20 mg by mouth daily.     [provider]  predniSONE (DELTASONE) 5 MG tablet Take 1 tablet (5 mg total) by mouth daily. 03/04/18   Mariel Aloe, MD  rosuvastatin (CRESTOR) 10 MG tablet Take 1 tablet (10 mg total) by mouth daily. Patient taking differently: Take 10 mg by mouth every evening.  02/21/19 02/16/20  Nahser, Wonda Cheng, MD  sennosides-docusate sodium (SENOKOT-S) 8.6-50 MG tablet Take 1 tablet by mouth daily as needed (constipation.).     [provider]  tacrolimus (PROGRAF) 1 MG capsule Take 5 mg by mouth 2 (two) times daily. (0900 & 2100)    [provider]  Tiotropium Bromide Monohydrate (SPIRIVA RESPIMAT) 1.25 MCG/ACT AERS Inhale 1 puff into the lungs daily. 10/23/17    [provider]    Allergies    Lactose, Asa arthritis strength-antacid [aspirin buffered], Aspirin, Atorvastatin, Banana, Lactose intolerance (gi), and Lisinopril  Review of Systems   Review of Systems  Constitutional: Positive for fatigue and fever. Negative for appetite change.  HENT: Negative for congestion.   Respiratory: Negative for cough and shortness of breath.   Cardiovascular: Negative for chest pain.  Gastrointestinal: Negative for abdominal pain.  Genitourinary: Negative for flank pain.  Musculoskeletal: Negative for gait problem.       Right elbow pain and swelling.  Neurological: Positive for weakness.  Hematological: Negative for adenopathy.  Psychiatric/Behavioral: Negative for confusion.    Physical Exam Updated Vital Signs BP 120/84 (BP Location: Right Leg)    Pulse (!) 104    Temp (!) 101.8 F (38.8 C) (Rectal)    Resp (!) 21    Ht 5\' 3"  (1.6 m)    Wt 74.8 kg    SpO2 99%    BMI 29.23 kg/m   Physical Exam Vitals and nursing note reviewed.  HENT:     Head: Normocephalic.  Eyes:     Extraocular Movements: Extraocular movements intact.  Cardiovascular:     Rate and Rhythm: Tachycardia present.  Pulmonary:     Breath sounds: No wheezing, rhonchi or rales.  Abdominal:     Tenderness: There is no abdominal tenderness.  Musculoskeletal:     Comments: Swelling of right elbow with erythema.  Warmth of the elbow.  For me had rather decreased range of motion but orthopedic PA examined and had better range of motion.  Neurovascular intact in right hand  Skin:    General: Skin is warm.     Capillary Refill: Capillary refill takes less than 2 seconds.  Neurological:     Mental Status: She is alert and oriented to person, place, and time.     ED Results / Procedures / Treatments   Labs (all labs ordered are listed, but only abnormal results are displayed) Labs Reviewed  COMPREHENSIVE METABOLIC PANEL - Abnormal; Notable for the following components:       Result Value   Glucose, Bld 172 (*)    Creatinine, Ser 1.18 (*)    GFR calc non Af Amer 45 (*)    GFR calc Af Amer 52 (*)    All other components within  normal limits  CBC WITH DIFFERENTIAL/PLATELET - Abnormal; Notable for the following components:   WBC 19.2 (*)    RBC 5.24 (*)    MCV 77.9 (*)    Platelets 129 (*)    Neutro Abs 16.0 (*)    Monocytes Absolute 1.9 (*)    Abs Immature Granulocytes 0.21 (*)    All other components within normal limits  C-REACTIVE PROTEIN - Abnormal; Notable for the following components:   CRP 12.7 (*)    All other components within normal limits  RESPIRATORY PANEL BY RT PCR (FLU A&B, COVID)  CULTURE, BLOOD (ROUTINE X 2)  CULTURE, BLOOD (ROUTINE X 2)  LACTIC ACID, PLASMA  SEDIMENTATION RATE  LACTIC ACID, PLASMA    EKG EKG Interpretation  Date/Time:  Thursday December 18 2019 12:12:57 EST Ventricular Rate:  103 PR Interval:    QRS Duration: 95 QT Interval:  351 QTC Calculation: 460 R Axis:   22 Text Interpretation: Sinus tachycardia Probable left atrial enlargement Abnormal R-wave progression, early transition Nonspecific T abnrm, anterolateral leads Confirmed by Davonna Belling [id number]) on 12/18/2019 1:12:37 PM   Radiology DG Chest 1 View  Result Date: 12/18/2019 CLINICAL DATA:  Fever. EXAM: CHEST  1 VIEW COMPARISON:  Chest x-ray dated 02/17/2018 FINDINGS: Heart size and pulmonary vascularity are normal and the lungs are clear. CABG. Aortic atherosclerosis. No acute bone abnormality. IMPRESSION: No acute abnormality. Electronically Signed   By: Lorriane Shire M.D.   On: 12/18/2019 13:32   DG Elbow Complete Right  Result Date: 12/18/2019 CLINICAL DATA:  Right elbow swelling for 2 days. Fever. EXAM: RIGHT ELBOW - COMPLETE 3+ VIEW COMPARISON:  None. FINDINGS: The bones of the right elbow appear normal. No joint effusion. There is subcutaneous edema around the elbow including posterior to the olecranon process. This could represent olecranon  bursitis with adjacent cellulitis. IMPRESSION: Findings consistent with olecranon bursitis and adjacent cellulitis. The bones are normal. Electronically Signed   By: Lorriane Shire M.D.   On: 12/18/2019 13:30    Procedures Procedures (including critical care time)  Medications Ordered in ED Medications  vancomycin (VANCOREADY) IVPB 1500 mg/300 mL (1,500 mg Intravenous New Bag/Given 12/18/19 1431)  fentaNYL (SUBLIMAZE) injection 50 mcg (50 mcg Intravenous Given 12/18/19 1233)  cefTRIAXone (ROCEPHIN) 2 g in sodium chloride 0.9 % 100 mL IVPB (0 g Intravenous Stopped 12/18/19 1431)  fentaNYL (SUBLIMAZE) injection 50 mcg (50 mcg Intravenous Given 12/18/19 1345)    ED Course  I have reviewed the triage vital signs and the nursing notes.  Pertinent labs & imaging results that were available during my care of the patient were reviewed by me and considered in my medical decision making (see chart for details).    MDM Rules/Calculators/A&P                      Patient with right elbow area infection.  Fever one 1.8 here.  On immunosuppression due to kidney transplant.  Seen by orthopedic surgery.  Does not think it is septic arthritis.  Cellulitis versus olecranon bursitis.  Nonoperative at this point.  IV antibiotics.  His post renal transplant is managed at [facility] but patient request to stay here if possible for treatment.  Does not appear to need any specialty specific treatment not managed here.  Will admit to unassigned medicine Final Clinical Impression(s) / ED Diagnoses Final diagnoses:  Cellulitis of right upper extremity  Kidney transplant recipient    Rx / DC Orders ED Discharge Orders  None       Davonna Belling, MD 12/18/19 [phone]

## 2019-12-18 NOTE — H&P (Signed)
History and Physical    [name] [id number] DOB: Mar 05, 1941 DOA: 12/18/2019  PCP: Thayer Headings, MD   Patient coming from: Home  I have personally briefly reviewed patient's old medical records in [place]  Chief Complaint: Right arm swelling and pain fever  HPI: [name] is a 79 y.o. 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    Patient had a colonoscopy done 2 days ago, during the procedure, patient had temporary peripheral IV on right wrist and later removed after the procedure done.  She went home, and the next day she began to develop pain and swelling on the right arm/elbow area.  States that she developed fevers up to 103 at home last night. She called her PCP who recommended her coming to ER. No difficulty breathing.  No cough.  No nausea vomiting or diarrhea.  Gets managed at [facility] for the transplant in 2012.   ED Course: Right elbow area rash, swelling and pain  Review of Systems: As per HPI otherwise 10 point review of systems negative.    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, hold ASA 2-3 weeks - consider plavix instead of ASA   Asthma    Coronary artery disease    cath 11/2007, grafts patent /  Nuclear, June, 2011, prior inferior MI with mild peri-infarct ischemia, anterior breast attenuation, EF 67%, done for renal transplant assessment / Low level exercise/Lexiscan Myoview (11/2013): EF 67%, no ischemi; normal study   Diabetes mellitus    type 2   ESRD on dialysis [facility])    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, 2012   Family history of breast cancer    Family history of prostate cancer    GERD (gastroesophageal reflux disease)    GI bleed 11/26/2010   January, 2012 , AVM   Gout    History of methicillin resistant staphylococcus aureus (MRSA)    Hyperlipidemia     Hyperparathyroidism    Hypertension    Myocardial infarction [facility])    Osteoporosis    Pneumonia 08/2010    Hospitalization, October, 2011   Primary osteoarthritis, 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   S/P kidney transplant    September, 2012, [facility]   Subacromial impingement of 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    Past Surgical History:  Procedure Laterality Date   ABDOMINAL HYSTERECTOMY  1980'S   ARTERIOVENOUS GRAFT PLACEMENT Left 03/04/2007   forearm   BIOPSY  09/30/2019   Procedure: BIOPSY;  Surgeon: Wilford Corner, MD;  Location: [facility];  Service: Endoscopy;;   BREAST EXCISIONAL BIOPSY Right    benign more than 10 yr ago   [place]  06/03/2002; 12/04/2007   COLONOSCOPY WITH PROPOFOL N/A 07/14/2014   Procedure: COLONOSCOPY WITH PROPOFOL;  Surgeon: Lear Ng, MD;  Location: [facility];  Service: Endoscopy;  Laterality: N/A;   COLONOSCOPY WITH PROPOFOL N/A 09/30/2019   Procedure: COLONOSCOPY WITH PROPOFOL;  Surgeon: Wilford Corner, MD;  Location: [facility];  Service: Endoscopy;  Laterality: N/A;   CORONARY ARTERY BYPASS GRAFT  06/06/2002   x 4   HOT HEMOSTASIS N/A 07/14/2014   Procedure: HOT HEMOSTASIS (ARGON PLASMA COAGULATION/BICAP);  Surgeon: Lear Ng, MD;  Location: [facility];  Service: Endoscopy;  Laterality: N/A;   KIDNEY TRANSPLANT Right 08/04/2011  ORIF PATELLA Left 04/06/2016   Procedure: OPEN REDUCTION INTERNAL (ORIF) LEFT  PATELLA;  Surgeon: Ninetta Lights, MD;  Location: [place];  Service: Orthopedics;  Laterality: Left;   POLYPECTOMY  09/30/2019   Procedure: POLYPECTOMY;  Surgeon: Wilford Corner, MD;  Location: [facility];  Service: Endoscopy;;   SHOULDER ARTHROSCOPY WITH ROTATOR CUFF REPAIR AND SUBACROMIAL DECOMPRESSION Left 08/03/2016   Procedure: LEFT SHOULDER ARTHROSCOPY WITH ROTATOR CUFF REPAIR AND SUBACROMIAL DECOMPRESSION with distal  claviculectomy and extentsive debridement;  Surgeon: Ninetta Lights, MD;  Location: [place];  Service: Orthopedics;  Laterality: Left;  Block   THROMBECTOMY AND REVISION OF ARTERIOVENTOUS (AV) GORETEX  GRAFT Left 05/08/2007   forearm   TOTAL HIP ARTHROPLASTY  12/18/2012   Procedure: TOTAL HIP ARTHROPLASTY;  Surgeon: Ninetta Lights, MD;  Location: [place];  Service: Orthopedics;  Laterality: Left;   VESICOVAGINAL FISTULA CLOSURE W/ TAH  1984     reports that she quit smoking about 36 years ago. Her smoking use included cigarettes. She has a 8.00 pack-year smoking history. She has never used smokeless tobacco. She reports that she does not drink alcohol or use drugs.  Allergies  Allergen Reactions   Sodium Hypochlorite Shortness Of Breath   Lactose Nausea And Vomiting   Asa Arthritis Strength-Antacid [Aspirin Buffered] Nausea And Vomiting and Other (See Comments)    STOMACH BURNS, also   Aspirin Nausea And Vomiting    Per pt. "can tolerate the enteric coated tablets".    Atorvastatin Other (See Comments)    Patient's skin was skin was sensitive   Banana Nausea And Vomiting   Lactose Intolerance (Gi) Nausea And Vomiting   Lisinopril Cough    Family History  Problem Relation Age of Onset   Breast cancer Sister 55   Prostate cancer Brother    Lung cancer Brother    Cancer Neg Hx      Prior to Admission medications   Medication Sig Start Date End Date Taking? Authorizing Provider  acetaminophen (TYLENOL) 650 MG CR tablet Take 650 mg by mouth every morning.   Yes [provider]  albuterol (PROVENTIL) (2.5 MG/3ML) 0.083% nebulizer solution Take 2.5 mg by nebulization every 6 (six) hours as needed for wheezing or shortness of breath.   Yes [provider]  albuterol (VENTOLIN HFA) 108 (90 Base) MCG/ACT inhaler Inhale 1-2 puffs into the lungs every 6 (six) hours as needed for wheezing or shortness of breath.   Yes [provider]   amLODipine (NORVASC) 10 MG tablet Take 10 mg by mouth daily.    Yes [provider]  aspirin EC 81 MG tablet Take 81 mg by mouth daily.   Yes [provider]  Azelastine HCl 0.15 % SOLN Place 1 spray into both nostrils daily as needed for allergies.   Yes [provider]  calcitRIOL (ROCALTROL) 0.25 MCG capsule Take 0.25 mcg by mouth daily.    Yes [provider]  fluticasone furoate-vilanterol (BREO ELLIPTA) 200-25 MCG/INH AEPB Inhale 1 puff into the lungs daily as needed (respiratory issues.).  10/23/17  Yes [provider]  nitroGLYCERIN (NITROSTAT) 0.4 MG SL tablet Place 1 tablet (0.4 mg total) under the tongue every 5 (five) minutes as needed for chest pain. 08/19/18  Yes Nahser, Wonda Cheng, MD  omeprazole (PRILOSEC) 20 MG capsule Take 20 mg by mouth daily after breakfast.    Yes [provider]  predniSONE (DELTASONE) 5 MG tablet Take 1 tablet (5 mg total)  by mouth daily. 03/04/18  Yes Mariel Aloe, MD  rosuvastatin (CRESTOR) 10 MG tablet Take 1 tablet (10 mg total) by mouth daily. Patient taking differently: Take 10 mg by mouth every evening.  02/21/19 02/16/20 Yes Nahser, Wonda Cheng, MD  sennosides-docusate sodium (SENOKOT-S) 8.6-50 MG tablet Take 1 tablet by mouth daily as needed (constipation.).    Yes [provider]  tacrolimus (PROGRAF) 1 MG capsule Take 5 mg by mouth 2 (two) times daily. (0900 & 2100)   Yes [provider]  Tiotropium Bromide Monohydrate (SPIRIVA RESPIMAT) 1.25 MCG/ACT AERS Inhale 1 puff into the lungs daily. 10/23/17  Yes [provider]  azaTHIOprine (IMURAN) 50 MG tablet Take 50 mg by mouth daily.    [provider]  Cholecalciferol 50 MCG (2000 UT) TABS Take 2,000 Units by mouth daily. 09/30/19 09/29/20  [provider]    Physical Exam: Vitals:   12/18/19 1217 12/18/19 1235 12/18/19 1347 12/18/19 1640  BP:   120/84   Pulse:   (!) 104   Resp:   (!) 21   Temp: (!)  101.8 F (38.8 C)   99.2 F (37.3 C)  TempSrc: Rectal   Oral  SpO2:   99%   Weight:  74.8 kg    Height:  5\' 3"  (1.6 m)      Constitutional: NAD, calm, comfortable Vitals:   12/18/19 1217 12/18/19 1235 12/18/19 1347 12/18/19 1640  BP:   120/84   Pulse:   (!) 104   Resp:   (!) 21   Temp: (!) 101.8 F (38.8 C)   99.2 F (37.3 C)  TempSrc: Rectal   Oral  SpO2:   99%   Weight:  74.8 kg    Height:  5\' 3"  (1.6 m)     Eyes: PERRL, lids and conjunctivae normal ENMT: Mucous membranes are moist. Posterior pharynx clear of any exudate or lesions.Normal dentition.  Neck: normal, supple, no masses, no thyromegaly Respiratory: clear to auscultation bilaterally, no wheezing, no crackles. Normal respiratory effort. No accessory muscle use.  Cardiovascular: Regular rate and rhythm, no murmurs / rubs / gallops. No extremity edema. 2+ pedal pulses. No carotid bruits.  Abdomen: no tenderness, no masses palpated. No hepatosplenomegaly. Bowel sounds positive.  Musculoskeletal: no clubbing / cyanosis. No joint deformity upper and lower extremities. Good ROM, no contractures. Normal muscle tone.  Skin: Right elbow area swelling and pain with tender, significant decrease of ROM. Neurologic: CN 2-12 grossly intact. Sensation intact, DTR normal. Strength 5/5 in all 4.  Psychiatric: Normal judgment and insight. Alert and oriented x 3. Normal mood.     Labs on Admission: I have personally reviewed following labs and imaging studies  CBC: Recent Labs  Lab 12/18/19 1221  WBC 19.2*  NEUTROABS 16.0*  HGB 14.0  HCT 40.8  MCV 77.9*  PLT [id number]*   Basic Metabolic Panel: Recent Labs  Lab 12/18/19 1221  NA 139  K 3.8  CL 102  CO2 27  GLUCOSE 172*  BUN 13  CREATININE 1.18*  CALCIUM 9.2   GFR: Estimated Creatinine Clearance: 39.3 mL/min (A) (by C-G formula based on SCr of 1.18 mg/dL (H)). Liver Function Tests: Recent Labs  Lab 12/18/19 1221  AST 19  ALT 12  ALKPHOS 46  BILITOT 1.2  PROT  6.6  ALBUMIN 3.9   No results for input(s): LIPASE, AMYLASE in the last 168 hours. No results for input(s): AMMONIA in the last 168 hours. Coagulation Profile: No results for input(s): INR,  PROTIME in the last 168 hours. Cardiac Enzymes: No results for input(s): CKTOTAL, CKMB, CKMBINDEX, TROPONINI in the last 168 hours. BNP (last 3 results) No results for input(s): PROBNP in the last 8760 hours. HbA1C: No results for input(s): HGBA1C in the last 72 hours. CBG: No results for input(s): GLUCAP in the last 168 hours. Lipid Profile: No results for input(s): CHOL, HDL, LDLCALC, TRIG, CHOLHDL, LDLDIRECT in the last 72 hours. Thyroid Function Tests: No results for input(s): TSH, T4TOTAL, FREET4, T3FREE, THYROIDAB in the last 72 hours. Anemia Panel: No results for input(s): VITAMINB12, FOLATE, FERRITIN, TIBC, IRON, RETICCTPCT in the last 72 hours. Urine analysis:    Component Value Date/Time   COLORURINE YELLOW 02/12/2018 2129   APPEARANCEUR CLEAR 02/12/2018 2129   LABSPEC >1.046 (H) 02/12/2018 2129   PHURINE 5.0 02/12/2018 2129   GLUCOSEU NEGATIVE 02/12/2018 2129   GLUCOSEU NEGATIVE 06/21/2012 0819   HGBUR NEGATIVE 02/12/2018 2129   BILIRUBINUR NEGATIVE 02/12/2018 2129   KETONESUR 5 (A) 02/12/2018 2129   PROTEINUR 100 (A) 02/12/2018 2129   UROBILINOGEN 0.2 12/10/2012 1056   NITRITE NEGATIVE 02/12/2018 2129   LEUKOCYTESUR NEGATIVE 02/12/2018 2129    Radiological Exams on Admission: DG Chest 1 View  Result Date: 12/18/2019 CLINICAL DATA:  Fever. EXAM: CHEST  1 VIEW COMPARISON:  Chest x-ray dated 02/17/2018 FINDINGS: Heart size and pulmonary vascularity are normal and the lungs are clear. CABG. Aortic atherosclerosis. No acute bone abnormality. IMPRESSION: No acute abnormality. Electronically Signed   By: Lorriane Shire M.D.   On: 12/18/2019 13:32   DG Elbow Complete Right  Result Date: 12/18/2019 CLINICAL DATA:  Right elbow swelling for 2 days. Fever. EXAM: RIGHT ELBOW - COMPLETE  3+ VIEW COMPARISON:  None. FINDINGS: The bones of the right elbow appear normal. No joint effusion. There is subcutaneous edema around the elbow including posterior to the olecranon process. This could represent olecranon bursitis with adjacent cellulitis. IMPRESSION: Findings consistent with olecranon bursitis and adjacent cellulitis. The bones are normal. Electronically Signed   By: Lorriane Shire M.D.   On: 12/18/2019 13:30    EKG: Independently reviewed.   Assessment/Plan Active Problems:   Cellulitis  Right arm cellulitis with infectious, X-ray showing olecranon bursitis likely septic too, given the significant decrease of ROM, will order right elbow MRI. Ortho on board Continue Vancomycin and ceftriaxone for now. Consider resistant organism coverage if not improving. Check ASO, MRSA screening.  S/P renal transplant, as of now does not have a life-threatening infection, will continue immuno-suppressants for now. BP stable, will continue current steroid dose.  HTN, no drop of BP, continue home meds.  Asthma, no s/s of acute exacerbation, continue home regimen.  CAD, no acute issue.    DVT prophylaxis: Heparin subcu Code Status: Full Code Family Communication: Left sister mesage Disposition Plan: Once symptoms improved, can be discharged with PO ABX. Consults called: Ortho Admission status: [facility]   Lequita Halt MD [facility] Pager [phone]  If 7PM-7AM, please contact night-coverage [email or web address] Password [facility]  12/18/2019, 4:45 PM

## 2019-12-18 NOTE — ED Triage Notes (Signed)
Pt here from home with c/o right elbow pain that radiates up to her shoulder , no trauma noted

## 2019-12-18 NOTE — Progress Notes (Signed)
Pharmacy Antibiotic Note  [name] is a 79 y.o. female admitted on 12/18/2019 with cellulitis.  Pharmacy has been consulted for vancomycin dosing. Pt is febrile with Tmax 101.8 and WBC is elevated at 19.2. SCr is WNL. Lactic acid is <2.   Plan: Vancomycin 1500mg  IV x 1 then 750mg  IV Q24H F/u renal fxn, C&S, clinical status and peak/trough at [facility] F/u continuation of gram neg coverage  Height: 5\' 3"  (160 cm) Weight: 165 lb (74.8 kg) IBW/kg (Calculated) : 52.4  Temp (24hrs), Avg:100.5 F (38.1 C), Min:99.1 F (37.3 C), Max:101.8 F (38.8 C)  Recent Labs  Lab 12/18/19 1221  WBC 19.2*  CREATININE 1.18*  LATICACIDVEN 1.8    CrCl cannot be calculated (Patient's most recent lab result is older than the maximum 21 days allowed.).    Allergies  Allergen Reactions  . Lactose Other (See Comments)  . Asa Arthritis Strength-Antacid [Aspirin Buffered] Other (See Comments)    STOMACH BURNS, N/V   . Aspirin Nausea And Vomiting    Per pt. "can tolerate the enteric coated tablets".   . Atorvastatin Other (See Comments)    Patient's skin was skin was sensitive  . Banana Nausea And Vomiting  . Lactose Intolerance (Gi) Nausea And Vomiting  . Lisinopril Cough    Antimicrobials this admission: Vanc 1/28>> CTX x 1 1/28  Dose adjustments this admission: N/A  Microbiology results: Pending  Thank you for allowing pharmacy to be a part of this patient's care.  Aritza Brunet, Rande Lawman 12/18/2019 12:46 PM

## 2019-12-18 NOTE — ED Notes (Signed)
Pt left hand restricted due to active working fissula. Right arm warm to touch and swollen. Suspected infection. One set of cultures drawn.

## 2019-12-18 NOTE — ED Notes (Signed)
Pt's sister is asking for an update when staff is available.

## 2019-12-19 ENCOUNTER — Encounter (HOSPITAL_COMMUNITY): Payer: Self-pay | Admitting: Internal Medicine

## 2019-12-19 DIAGNOSIS — L0390 Cellulitis, unspecified: Secondary | ICD-10-CM

## 2019-12-19 HISTORY — DX: Cellulitis, unspecified: L03.90

## 2019-12-19 LAB — BASIC METABOLIC PANEL
Anion gap: 10 (ref 5–15)
BUN: 11 mg/dL (ref 8–23)
CO2: 24 mmol/L (ref 22–32)
Calcium: 8.8 mg/dL — ABNORMAL LOW (ref 8.9–10.3)
Chloride: 102 mmol/L (ref 98–111)
Creatinine, Ser: 1.09 mg/dL — ABNORMAL HIGH (ref 0.44–1.00)
GFR calc Af Amer: 57 mL/min — ABNORMAL LOW (ref >60)
GFR calc non Af Amer: 49 mL/min — ABNORMAL LOW (ref >60)
Glucose, Bld: 140 mg/dL — ABNORMAL HIGH (ref 70–99)
Potassium: 3.6 mmol/L (ref 3.5–5.1)
Sodium: 136 mmol/L (ref 135–145)

## 2019-12-19 LAB — CBC
HCT: 39.8 % (ref 36.0–46.0)
Hemoglobin: 14 g/dL (ref 12.0–15.0)
MCH: 27.1 pg (ref 26.0–34.0)
MCHC: 35.2 g/dL (ref 30.0–36.0)
MCV: 77.1 fL — ABNORMAL LOW (ref 80.0–100.0)
Platelets: 116 10*3/uL — ABNORMAL LOW (ref 150–400)
RBC: 5.16 MIL/uL — ABNORMAL HIGH (ref 3.87–5.11)
RDW: 14.7 % (ref 11.5–15.5)
WBC: 18.8 10*3/uL — ABNORMAL HIGH (ref 4.0–10.5)
nRBC: 0 % (ref 0.0–0.2)

## 2019-12-19 LAB — ANTISTREPTOLYSIN O TITER: ASO: <20 IU/mL (ref 0.0–200.0)

## 2019-12-19 MED ORDER — SODIUM CHLORIDE 0.9 % IV BOLUS
500.0000 mL | Freq: Once | INTRAVENOUS | Status: AC
  Administered 2019-12-19: 500 mL via INTRAVENOUS

## 2019-12-19 MED ORDER — OXYCODONE HCL 5 MG PO TABS
5.0000 mg | ORAL_TABLET | Freq: Once | ORAL | Status: AC
  Administered 2019-12-19: 14:00:00 5 mg via ORAL

## 2019-12-19 MED ORDER — OXYCODONE HCL 5 MG PO TABS
5.0000 mg | ORAL_TABLET | ORAL | Status: DC | PRN
  Administered 2019-12-19 – 2019-12-21 (×5): 10 mg via ORAL
  Administered 2019-12-21 (×2): 5 mg via ORAL
  Administered 2019-12-21 – 2019-12-25 (×7): 10 mg via ORAL
  Filled 2019-12-19: qty 1
  Filled 2019-12-19: qty 2
  Filled 2019-12-19 (×2): qty 1
  Filled 2019-12-19: qty 2
  Filled 2019-12-19 (×2): qty 1
  Filled 2019-12-19 (×10): qty 2

## 2019-12-19 MED ORDER — SODIUM CHLORIDE 0.9 % IV SOLN: INTRAVENOUS | Status: DC

## 2019-12-19 NOTE — Progress Notes (Signed)
PROGRESS NOTE    [name]  [id number] DOB: 09-06-1941 DOA: 12/18/2019 PCP: No primary care provider on file.   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  Patient had a colonoscopy 2 days prior to admission, during the procedure patient had a temporary peripheral IV on the right breast and later removed after the procedure was done.  She went home and then the next day she developed pain and swelling in the right arm elbow area.  She also was having fever up to 103. Evaluation in the ED show a white blood cell at 19, CRP 12, lactic acid 1.8, BUN 13, creatinine 1.1.  Influenza panel negative respiratory panel negative.  Blood cultures pending.  Elbow x-ray; Findings consistent with olecranon bursitis and adjacent cellulitis. The bones are normal.   Assessment & Plan:   Active Problems:   Cellulitis   1-Olecranon bursitis, right arm cellulitis; MRI was negative for septic arthritis and or abscess. Continue with vancomycin and cefepime. Follow blood cultures. Orthopedic following. Continue with pain management. Elevation  of the right arm.  2-s/p renal transplant: Continue with low-dose prednisone and Prograf. Patient relate that Imuran was to stop by her transplant doctor. Supposed to get lab work today.  I called transplant center at [facility].  Patient is supposed to get CMV DNA quantitative and BK quantitative.   3-hypertension: Systolic blood pressure dropped.  Continue to hold Norvasc. Asthma: Continue with as needed inhaler. History of coronary artery disease; asymptomatic currently.   Estimated body mass index is 29.23 kg/m as calculated from the following:   Height as of this encounter: 5\' 3"  (1.6 m).   Weight as of this encounter: 74.8 kg.   DVT prophylaxis: Heparin Code Status: Full code Family Communication: Care  discussed with patient Disposition Plan:  Patient is from home. Suspect patient will be able to be discharged home Patient need to remain in the hospital for IV antibiotics, due to persistent leukocytosis, hypotension.  Requiring IV fluid as well.  History of renal transplant on immunosuppressive medication, require broad-spectrum IV antibiotics.  Consultants:   Orthopedic  Procedures:   None  Antimicrobials:    Subjective: She is complaining of severe right arm elbow pain. Supposed to follow-up yesterday with her transplant team for lab work.   Objective: Vitals:   12/18/19 2045 12/19/19 0427 12/19/19 0634 12/19/19 0912  BP: (!) 108/49 90/74 (!) 90/44 (!) 94/49  Pulse: (!) 102 (!) 106 90 92  Resp: 18 20 18 18   Temp: 100 F (37.8 C) 99.1 F (37.3 C) 98.8 F (37.1 C) 99.1 F (37.3 C)  TempSrc: Oral Oral Oral Oral  SpO2: 98% 97% 97% 95%  Weight:      Height:        Intake/Output Summary (Last 24 hours) at 12/19/2019 0932 Last data filed at 12/18/2019 1830 Gross per 24 hour  Intake 520 ml  Output 0 ml  Net 520 ml   Filed Weights   12/18/19 1235  Weight: 74.8 kg    Examination:  General exam: Appears calm and comfortable  Respiratory system: Clear to auscultation. Respiratory effort normal. Cardiovascular system: S1 & S2 heard, RRR. No JVD, murmurs, rubs, gallops or clicks. No pedal edema. Gastrointestinal system: Abdomen is nondistended, soft and nontender. No organomegaly or masses felt. Normal bowel sounds heard. Central nervous system: Alert and oriented. No focal neurological deficits. Extremities: Symmetric 5 x  5 power. Skin: Right arm elbow with redness and edema.    Data Reviewed: I have personally reviewed following labs and imaging studies  CBC: Recent Labs  Lab 12/18/19 1221 12/19/19 0203  WBC 19.2* 18.8*  NEUTROABS 16.0*  --   HGB 14.0 14.0  HCT 40.8 39.8  MCV 77.9* 77.1*  PLT 129* [id number]*   Basic Metabolic Panel: Recent Labs  Lab  12/18/19 1221 12/19/19 0203  NA 139 136  K 3.8 3.6  CL 102 102  CO2 27 24  GLUCOSE 172* 140*  BUN 13 11  CREATININE 1.18* 1.09*  CALCIUM 9.2 8.8*   GFR: Estimated Creatinine Clearance: 42.6 mL/min (A) (by C-G formula based on SCr of 1.09 mg/dL (H)). Liver Function Tests: Recent Labs  Lab 12/18/19 1221  AST 19  ALT 12  ALKPHOS 46  BILITOT 1.2  PROT 6.6  ALBUMIN 3.9   No results for input(s): LIPASE, AMYLASE in the last 168 hours. No results for input(s): AMMONIA in the last 168 hours. Coagulation Profile: No results for input(s): INR, PROTIME in the last 168 hours. Cardiac Enzymes: No results for input(s): CKTOTAL, CKMB, CKMBINDEX, TROPONINI in the last 168 hours. BNP (last 3 results) No results for input(s): PROBNP in the last 8760 hours. HbA1C: No results for input(s): HGBA1C in the last 72 hours. CBG: No results for input(s): GLUCAP in the last 168 hours. Lipid Profile: No results for input(s): CHOL, HDL, LDLCALC, TRIG, CHOLHDL, LDLDIRECT in the last 72 hours. Thyroid Function Tests: No results for input(s): TSH, T4TOTAL, FREET4, T3FREE, THYROIDAB in the last 72 hours. Anemia Panel: No results for input(s): VITAMINB12, FOLATE, FERRITIN, TIBC, IRON, RETICCTPCT in the last 72 hours. Sepsis Labs: Recent Labs  Lab 12/18/19 1221 12/18/19 1902  LATICACIDVEN 1.8 1.9    Recent Results (from the past 240 hour(s))  Culture, blood (routine x 2)     Status: None (Preliminary result)   Collection Time: 12/18/19 12:23 PM   Specimen: BLOOD  Result Value Ref Range Status   Specimen Description BLOOD SITE NOT SPECIFIED  Final   Special Requests   Final    BOTTLES DRAWN AEROBIC AND ANAEROBIC Blood Culture adequate volume   Culture   Final    NO GROWTH < 24 HOURS Performed at [facility], [place] [address] [place], [place] [address]    Report Status PENDING  Incomplete  Respiratory Panel by RT PCR (Flu A&B, Covid) - Nasopharyngeal Swab     Status: None    Collection Time: 12/18/19 12:24 PM   Specimen: Nasopharyngeal Swab  Result Value Ref Range Status   SARS Coronavirus 2 by RT PCR NEGATIVE NEGATIVE Final    Comment: (NOTE) SARS-CoV-2 target nucleic acids are NOT DETECTED. The SARS-CoV-2 RNA is generally detectable in upper respiratoy specimens during the acute phase of infection. The lowest concentration of SARS-CoV-2 viral copies this assay can detect is 131 copies/mL. A negative result does not preclude SARS-Cov-2 infection and should not be used as the sole basis for treatment or other patient management decisions. A negative result may occur with  improper specimen collection/handling, submission of specimen other than nasopharyngeal swab, presence of viral mutation(s) within the areas targeted by this assay, and inadequate number of viral copies (<131 copies/mL). A negative result must be combined with clinical observations, patient history, and epidemiological information. The expected result is Negative. Fact Sheet for Patients:  PinkCheek.be Fact Sheet for Healthcare Providers:  GravelBags.it This test is not yet ap proved or cleared  by the [place] and  has been authorized for detection and/or diagnosis of SARS-CoV-2 by FDA under an Emergency Use Authorization (EUA). This EUA will remain  in effect (meaning this test can be used) for the duration of the COVID-19 declaration under Section 564(b)(1) of the Act, 21 U.S.C. section 360bbb-3(b)(1), unless the authorization is terminated or revoked sooner.    Influenza A by PCR NEGATIVE NEGATIVE Final   Influenza B by PCR NEGATIVE NEGATIVE Final    Comment: (NOTE) The Xpert Xpress SARS-CoV-2/FLU/RSV assay is intended as an aid in  the diagnosis of influenza from Nasopharyngeal swab specimens and  should not be used as a sole basis for treatment. Nasal washings and  aspirates are unacceptable for Xpert Xpress  SARS-CoV-2/FLU/RSV  testing. Fact Sheet for Patients: PinkCheek.be Fact Sheet for Healthcare Providers: GravelBags.it This test is not yet approved or cleared by the [place] FDA and  has been authorized for detection and/or diagnosis of SARS-CoV-2 by  FDA under an Emergency Use Authorization (EUA). This EUA will remain  in effect (meaning this test can be used) for the duration of the  Covid-19 declaration under Section 564(b)(1) of the Act, 21  U.S.C. section 360bbb-3(b)(1), unless the authorization is  terminated or revoked. Performed at [facility], [place] [address], [place], [place] [address]   MRSA PCR Screening     Status: None   Collection Time: 12/18/19  6:00 PM   Specimen: Nasopharyngeal  Result Value Ref Range Status   MRSA by PCR NEGATIVE NEGATIVE Final    Comment:        The GeneXpert MRSA Assay (FDA approved for NASAL specimens only), is one component of a comprehensive MRSA colonization surveillance program. It is not intended to diagnose MRSA infection nor to guide or monitor treatment for MRSA infections. Performed at [facility], [place] [address] [place], [place] [address]   Culture, blood (routine x 2)     Status: None (Preliminary result)   Collection Time: 12/18/19  7:02 PM   Specimen: BLOOD RIGHT HAND  Result Value Ref Range Status   Specimen Description BLOOD RIGHT HAND  Final   Special Requests   Final    BOTTLES DRAWN AEROBIC ONLY Blood Culture results may not be optimal due to an inadequate volume of blood received in culture bottles   Culture   Final    NO GROWTH < 12 HOURS Performed at [facility], [place] [address], [place], [place] [address]    Report Status PENDING  Incomplete         Radiology Studies: DG Chest 1 View  Result Date: 12/18/2019 CLINICAL DATA:  Fever. EXAM: CHEST  1 VIEW COMPARISON:  Chest x-ray dated 02/17/2018 FINDINGS: Heart size and  pulmonary vascularity are normal and the lungs are clear. CABG. Aortic atherosclerosis. No acute bone abnormality. IMPRESSION: No acute abnormality. Electronically Signed   By: Lorriane Shire M.D.   On: 12/18/2019 13:32   DG Elbow Complete Right  Result Date: 12/18/2019 CLINICAL DATA:  Right elbow swelling for 2 days. Fever. EXAM: RIGHT ELBOW - COMPLETE 3+ VIEW COMPARISON:  None. FINDINGS: The bones of the right elbow appear normal. No joint effusion. There is subcutaneous edema around the elbow including posterior to the olecranon process. This could represent olecranon bursitis with adjacent cellulitis. IMPRESSION: Findings consistent with olecranon bursitis and adjacent cellulitis. The bones are normal. Electronically Signed   By: Lorriane Shire M.D.   On: 12/18/2019 13:30  MR ELBOW RIGHT WO CONTRAST  Result Date: 12/19/2019 CLINICAL DATA:  Pain and swelling in the right elbow area. EXAM: MRI OF THE RIGHT ELBOW WITHOUT CONTRAST TECHNIQUE: Multiplanar, multisequence MR imaging of the elbow was performed. No intravenous contrast was administered. COMPARISON:  Radiographs 12/18/2019 FINDINGS: Examination is severely limited by patient motion. No marrow signal abnormality to suggest fracture or osteomyelitis. No obvious joint effusion to suggest septic arthritis. Fairly extensive subcutaneous soft tissue swelling/edema/fluid suggesting cellulitis. No discrete fluid collection to suggest a drainable abscess. Moderate edema in the region of the olecranon bursa but no discrete fluid collection to suggest olecranon bursitis. Grossly the radial and ulnar collateral ligaments are intact and the biceps and triceps tendons are intact. The brachialis tendon is also intact. IMPRESSION: 1. Very limited examination. 2. Diffuse cellulitis without discrete fluid collection to suggest an abscess. 3. No findings suspicious for septic arthritis or osteomyelitis. Electronically Signed   By: Marijo Sanes M.D.   On: 12/19/2019  07:56        Scheduled Meds: . aspirin EC  81 mg Oral Daily  . calcitRIOL  0.25 mcg Oral Daily  . fluticasone furoate-vilanterol  1 puff Inhalation Daily  . heparin  5,000 Units Subcutaneous Q12H  . pantoprazole  40 mg Oral Daily  . predniSONE  5 mg Oral Daily  . rosuvastatin  10 mg Oral QPM  . tacrolimus  5 mg Oral BID  . umeclidinium bromide  1 puff Inhalation Daily   Continuous Infusions: . sodium chloride    . cefTRIAXone (ROCEPHIN)  IV    . sodium chloride    . vancomycin       LOS: 1 day    Time spent: 35 minutes     Elmarie Shiley, MD [facility]   If 7PM-7AM, please contact night-coverage  Password [facility] 12/19/2019, 9:32 AM

## 2019-12-20 LAB — BASIC METABOLIC PANEL
Anion gap: 6 (ref 5–15)
BUN: 11 mg/dL (ref 8–23)
CO2: 25 mmol/L (ref 22–32)
Calcium: 7.8 mg/dL — ABNORMAL LOW (ref 8.9–10.3)
Chloride: 102 mmol/L (ref 98–111)
Creatinine, Ser: 1.2 mg/dL — ABNORMAL HIGH (ref 0.44–1.00)
GFR calc Af Amer: 51 mL/min — ABNORMAL LOW (ref >60)
GFR calc non Af Amer: 44 mL/min — ABNORMAL LOW (ref >60)
Glucose, Bld: 109 mg/dL — ABNORMAL HIGH (ref 70–99)
Potassium: 3.9 mmol/L (ref 3.5–5.1)
Sodium: 133 mmol/L — ABNORMAL LOW (ref 135–145)

## 2019-12-20 LAB — CBC
HCT: 33.7 % — ABNORMAL LOW (ref 36.0–46.0)
Hemoglobin: 11.4 g/dL — ABNORMAL LOW (ref 12.0–15.0)
MCH: 26.7 pg (ref 26.0–34.0)
MCHC: 33.8 g/dL (ref 30.0–36.0)
MCV: 78.9 fL — ABNORMAL LOW (ref 80.0–100.0)
Platelets: 103 10*3/uL — ABNORMAL LOW (ref 150–400)
RBC: 4.27 MIL/uL (ref 3.87–5.11)
RDW: 15 % (ref 11.5–15.5)
WBC: 13.9 10*3/uL — ABNORMAL HIGH (ref 4.0–10.5)
nRBC: 0 % (ref 0.0–0.2)

## 2019-12-20 MED ORDER — SODIUM CHLORIDE 0.9 % IV SOLN
100.0000 mg | Freq: Two times a day (BID) | INTRAVENOUS | Status: DC
  Administered 2019-12-20 – 2019-12-21 (×3): 100 mg via INTRAVENOUS
  Filled 2019-12-20 (×4): qty 100

## 2019-12-20 NOTE — Plan of Care (Signed)
  Problem: Pain Managment: Goal: General experience of comfort will improve Outcome: Progressing   Problem: Safety: Goal: Ability to remain free from injury will improve Outcome: Progressing   Problem: Skin Integrity: Goal: Risk for impaired skin integrity will decrease Outcome: Progressing   

## 2019-12-20 NOTE — Progress Notes (Signed)
PROGRESS NOTE    [name]  [id number] DOB: [birth year] DOA: 12/18/2019 PCP: No primary care provider on file.   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  Patient had a colonoscopy 2 days prior to admission, during the procedure patient had a temporary peripheral IV on the right breast and later removed after the procedure was done.  She went home and then the next day she developed pain and swelling in the right arm elbow area.  She also was having fever up to 103. Evaluation in the ED show a white blood cell at 19, CRP 12, lactic acid 1.8, BUN 13, creatinine 1.1.  Influenza panel negative respiratory panel negative.  Blood cultures pending.  Elbow x-ray; Findings consistent with olecranon bursitis and adjacent cellulitis. The bones are normal.   Assessment & Plan:   Active Problems:   Cellulitis   1-Olecranon bursitis, right arm cellulitis; MRI was negative for septic arthritis and or abscess. Continue with  Cefepime. Change vancomycin to Doxy to avoid AKI Follow blood cultures: no growth to date.  Orthopedic following. Continue with pain management. Elevation  of the right arm. Patient report improvement of pain.  WBC trending down.   2-s/p renal transplant: Continue with low-dose prednisone and Prograf. Patient relate that Imuran was to stop by her transplant doctor. Supposed to get lab work today.  I called transplant center at [facility].  Patient is supposed to get CMV DNA quantitative and BK quantitative. Results pending.  Creatinine stable at 1.2  3-Hypertension: Systolic blood pressure dropped.  Continue to hold Norvasc. 4-Asthma: Continue with as needed inhaler. 5-History of coronary artery disease; asymptomatic currently.   Estimated body mass index is 29.23 kg/m as calculated from the following:   Height as of this  encounter: 5\' 3"  (1.6 m).   Weight as of this encounter: 74.8 kg.   DVT prophylaxis: Heparin Code Status: Full code Family Communication: Care discussed with patient Disposition Plan:  Patient is from home. Suspect patient will be able to be discharged home Patient need to remain in the hospital for IV antibiotics, due to persistent Right arm swelling, edema.   Consultants:   Orthopedic  Procedures:   None  Antimicrobials:    Subjective: She is complaining of severe right arm elbow pain. Supposed to follow-up yesterday with her transplant team for lab work.   Objective: Vitals:   12/19/19 1758 12/19/19 2039 12/20/19 0500 12/20/19 0836  BP: (!) 117/48 95/74 104/71   Pulse: 88 86 82 90  Resp: 18 19 18 16   Temp: 99 F (37.2 C) 98.8 F (37.1 C) 98 F (36.7 C)   TempSrc: Axillary Oral Oral   SpO2: 97% 96% 98% 95%  Weight:      Height:        Intake/Output Summary (Last 24 hours) at 12/20/2019 0856 Last data filed at 12/20/2019 0548 Gross per 24 hour  Intake 2379.85 ml  Output --  Net 2379.85 ml   Filed Weights   12/18/19 1235  Weight: 74.8 kg    Examination:  General exam: NAD Respiratory system: CTA Cardiovascular system:  S1, S 2 RRR Gastrointestinal system: BS present, soft, nt Central nervous system: non focal.  Extremities: symmetric power Skin: Right arm with edema and redness    Data Reviewed: I have personally reviewed following labs and imaging studies  CBC: Recent Labs  Lab 12/18/19 1221 12/19/19 0203  12/20/19 0308  WBC 19.2* 18.8* 13.9*  NEUTROABS 16.0*  --   --   HGB 14.0 14.0 11.4*  HCT 40.8 39.8 33.7*  MCV 77.9* 77.1* 78.9*  PLT 129* 116* [id number]*   Basic Metabolic Panel: Recent Labs  Lab 12/18/19 1221 12/19/19 0203 12/20/19 0308  NA 139 136 133*  K 3.8 3.6 3.9  CL 102 102 102  CO2 27 24 25   GLUCOSE 172* 140* 109*  BUN 13 11 11   CREATININE 1.18* 1.09* 1.20*  CALCIUM 9.2 8.8* 7.8*   GFR: Estimated Creatinine  Clearance: 38.7 mL/min (A) (by C-G formula based on SCr of 1.2 mg/dL (H)). Liver Function Tests: Recent Labs  Lab 12/18/19 1221  AST 19  ALT 12  ALKPHOS 46  BILITOT 1.2  PROT 6.6  ALBUMIN 3.9   No results for input(s): LIPASE, AMYLASE in the last 168 hours. No results for input(s): AMMONIA in the last 168 hours. Coagulation Profile: No results for input(s): INR, PROTIME in the last 168 hours. Cardiac Enzymes: No results for input(s): CKTOTAL, CKMB, CKMBINDEX, TROPONINI in the last 168 hours. BNP (last 3 results) No results for input(s): PROBNP in the last 8760 hours. HbA1C: No results for input(s): HGBA1C in the last 72 hours. CBG: No results for input(s): GLUCAP in the last 168 hours. Lipid Profile: No results for input(s): CHOL, HDL, LDLCALC, TRIG, CHOLHDL, LDLDIRECT in the last 72 hours. Thyroid Function Tests: No results for input(s): TSH, T4TOTAL, FREET4, T3FREE, THYROIDAB in the last 72 hours. Anemia Panel: No results for input(s): VITAMINB12, FOLATE, FERRITIN, TIBC, IRON, RETICCTPCT in the last 72 hours. Sepsis Labs: Recent Labs  Lab 12/18/19 1221 12/18/19 1902  LATICACIDVEN 1.8 1.9    Recent Results (from the past 240 hour(s))  Culture, blood (routine x 2)     Status: None (Preliminary result)   Collection Time: 12/18/19 12:23 PM   Specimen: BLOOD  Result Value Ref Range Status   Specimen Description BLOOD SITE NOT SPECIFIED  Final   Special Requests   Final    BOTTLES DRAWN AEROBIC AND ANAEROBIC Blood Culture adequate volume   Culture   Final    NO GROWTH < 24 HOURS Performed at [facility], [place] [address] [place], [place] [address]    Report Status PENDING  Incomplete  Respiratory Panel by RT PCR (Flu A&B, Covid) - Nasopharyngeal Swab     Status: None   Collection Time: 12/18/19 12:24 PM   Specimen: Nasopharyngeal Swab  Result Value Ref Range Status   SARS Coronavirus 2 by RT PCR NEGATIVE NEGATIVE Final    Comment: (NOTE) SARS-CoV-2 target  nucleic acids are NOT DETECTED. The SARS-CoV-2 RNA is generally detectable in upper respiratoy specimens during the acute phase of infection. The lowest concentration of SARS-CoV-2 viral copies this assay can detect is 131 copies/mL. A negative result does not preclude SARS-Cov-2 infection and should not be used as the sole basis for treatment or other patient management decisions. A negative result may occur with  improper specimen collection/handling, submission of specimen other than nasopharyngeal swab, presence of viral mutation(s) within the areas targeted by this assay, and inadequate number of viral copies (<131 copies/mL). A negative result must be combined with clinical observations, patient history, and epidemiological information. The expected result is Negative. Fact Sheet for Patients:  PinkCheek.be Fact Sheet for Healthcare Providers:  GravelBags.it This test is not yet ap proved or cleared by the [place] FDA and  has been authorized for detection and/or diagnosis  of SARS-CoV-2 by FDA under an Emergency Use Authorization (EUA). This EUA will remain  in effect (meaning this test can be used) for the duration of the COVID-19 declaration under Section 564(b)(1) of the Act, 21 U.S.C. section 360bbb-3(b)(1), unless the authorization is terminated or revoked sooner.    Influenza A by PCR NEGATIVE NEGATIVE Final   Influenza B by PCR NEGATIVE NEGATIVE Final    Comment: (NOTE) The Xpert Xpress SARS-CoV-2/FLU/RSV assay is intended as an aid in  the diagnosis of influenza from Nasopharyngeal swab specimens and  should not be used as a sole basis for treatment. Nasal washings and  aspirates are unacceptable for Xpert Xpress SARS-CoV-2/FLU/RSV  testing. Fact Sheet for Patients: PinkCheek.be Fact Sheet for Healthcare Providers: GravelBags.it This test is not  yet approved or cleared by the [place] FDA and  has been authorized for detection and/or diagnosis of SARS-CoV-2 by  FDA under an Emergency Use Authorization (EUA). This EUA will remain  in effect (meaning this test can be used) for the duration of the  Covid-19 declaration under Section 564(b)(1) of the Act, 21  U.S.C. section 360bbb-3(b)(1), unless the authorization is  terminated or revoked. Performed at [facility], [place] [address], [place], [place] [address]   MRSA PCR Screening     Status: None   Collection Time: 12/18/19  6:00 PM   Specimen: Nasopharyngeal  Result Value Ref Range Status   MRSA by PCR NEGATIVE NEGATIVE Final    Comment:        The GeneXpert MRSA Assay (FDA approved for NASAL specimens only), is one component of a comprehensive MRSA colonization surveillance program. It is not intended to diagnose MRSA infection nor to guide or monitor treatment for MRSA infections. Performed at [facility], [place] [address] [place], [place] [address]   Culture, blood (routine x 2)     Status: None (Preliminary result)   Collection Time: 12/18/19  7:02 PM   Specimen: BLOOD RIGHT HAND  Result Value Ref Range Status   Specimen Description BLOOD RIGHT HAND  Final   Special Requests   Final    BOTTLES DRAWN AEROBIC ONLY Blood Culture results may not be optimal due to an inadequate volume of blood received in culture bottles   Culture   Final    NO GROWTH < 12 HOURS Performed at [facility], [place] [address] [place], [place] [address]    Report Status PENDING  Incomplete         Radiology Studies: DG Chest 1 View  Result Date: 12/18/2019 CLINICAL DATA:  Fever. EXAM: CHEST  1 VIEW COMPARISON:  Chest x-ray dated 02/17/2018 FINDINGS: Heart size and pulmonary vascularity are normal and the lungs are clear. CABG. Aortic atherosclerosis. No acute bone abnormality. IMPRESSION: No acute abnormality. Electronically Signed   By: Lorriane Shire M.D.   On:  12/18/2019 13:32   DG Elbow Complete Right  Result Date: 12/18/2019 CLINICAL DATA:  Right elbow swelling for 2 days. Fever. EXAM: RIGHT ELBOW - COMPLETE 3+ VIEW COMPARISON:  None. FINDINGS: The bones of the right elbow appear normal. No joint effusion. There is subcutaneous edema around the elbow including posterior to the olecranon process. This could represent olecranon bursitis with adjacent cellulitis. IMPRESSION: Findings consistent with olecranon bursitis and adjacent cellulitis. The bones are normal. Electronically Signed   By: Lorriane Shire M.D.   On: 12/18/2019 13:30   MR ELBOW RIGHT WO CONTRAST  Result Date: 12/19/2019 CLINICAL DATA:  Pain and swelling in the right elbow area. EXAM: MRI OF THE RIGHT ELBOW WITHOUT CONTRAST TECHNIQUE: Multiplanar, multisequence MR imaging of the elbow was performed. No intravenous contrast was administered. COMPARISON:  Radiographs 12/18/2019 FINDINGS: Examination is severely limited by patient motion. No marrow signal abnormality to suggest fracture or osteomyelitis. No obvious joint effusion to suggest septic arthritis. Fairly extensive subcutaneous soft tissue swelling/edema/fluid suggesting cellulitis. No discrete fluid collection to suggest a drainable abscess. Moderate edema in the region of the olecranon bursa but no discrete fluid collection to suggest olecranon bursitis. Grossly the radial and ulnar collateral ligaments are intact and the biceps and triceps tendons are intact. The brachialis tendon is also intact. IMPRESSION: 1. Very limited examination. 2. Diffuse cellulitis without discrete fluid collection to suggest an abscess. 3. No findings suspicious for septic arthritis or osteomyelitis. Electronically Signed   By: Marijo Sanes M.D.   On: 12/19/2019 07:56        Scheduled Meds: . aspirin EC  81 mg Oral Daily  . calcitRIOL  0.25 mcg Oral Daily  . fluticasone furoate-vilanterol  1 puff Inhalation Daily  . heparin  5,000 Units Subcutaneous  Q12H  . pantoprazole  40 mg Oral Daily  . predniSONE  5 mg Oral Daily  . rosuvastatin  10 mg Oral QPM  . tacrolimus  5 mg Oral BID  . umeclidinium bromide  1 puff Inhalation Daily   Continuous Infusions: . sodium chloride 100 mL/hr at 12/20/19 0548  . cefTRIAXone (ROCEPHIN)  IV 1 g (12/19/19 1308)  . doxycycline (VIBRAMYCIN) IV       LOS: 2 days    Time spent: 35 minutes     Elmarie Shiley, MD [facility]   If 7PM-7AM, please contact night-coverage  Password [facility] 12/20/2019, 8:56 AM

## 2019-12-21 LAB — BASIC METABOLIC PANEL
Anion gap: 9 (ref 5–15)
BUN: 11 mg/dL (ref 8–23)
CO2: 23 mmol/L (ref 22–32)
Calcium: 8.2 mg/dL — ABNORMAL LOW (ref 8.9–10.3)
Chloride: 105 mmol/L (ref 98–111)
Creatinine, Ser: 1.09 mg/dL — ABNORMAL HIGH (ref 0.44–1.00)
GFR calc Af Amer: 57 mL/min — ABNORMAL LOW (ref >60)
GFR calc non Af Amer: 49 mL/min — ABNORMAL LOW (ref >60)
Glucose, Bld: 103 mg/dL — ABNORMAL HIGH (ref 70–99)
Potassium: 3.8 mmol/L (ref 3.5–5.1)
Sodium: 137 mmol/L (ref 135–145)

## 2019-12-21 LAB — CBC
HCT: 31.7 % — ABNORMAL LOW (ref 36.0–46.0)
Hemoglobin: 10.9 g/dL — ABNORMAL LOW (ref 12.0–15.0)
MCH: 26.7 pg (ref 26.0–34.0)
MCHC: 34.4 g/dL (ref 30.0–36.0)
MCV: 77.5 fL — ABNORMAL LOW (ref 80.0–100.0)
Platelets: 124 10*3/uL — ABNORMAL LOW (ref 150–400)
RBC: 4.09 MIL/uL (ref 3.87–5.11)
RDW: 14.7 % (ref 11.5–15.5)
WBC: 9.9 10*3/uL (ref 4.0–10.5)
nRBC: 0 % (ref 0.0–0.2)

## 2019-12-21 MED ORDER — CEPHALEXIN 500 MG PO CAPS
500.0000 mg | ORAL_CAPSULE | Freq: Three times a day (TID) | ORAL | Status: DC
  Administered 2019-12-21: 500 mg via ORAL
  Filled 2019-12-21: qty 1

## 2019-12-21 MED ORDER — SODIUM CHLORIDE 0.9 % IV BOLUS
500.0000 mL | Freq: Once | INTRAVENOUS | Status: AC
  Administered 2019-12-21: 500 mL via INTRAVENOUS

## 2019-12-21 MED ORDER — DOXYCYCLINE HYCLATE 100 MG PO TABS
100.0000 mg | ORAL_TABLET | Freq: Two times a day (BID) | ORAL | Status: DC
  Administered 2019-12-21 – 2019-12-22 (×2): 100 mg via ORAL
  Filled 2019-12-21 (×2): qty 1

## 2019-12-21 MED ORDER — CEFTRIAXONE SODIUM 1 G IJ SOLR
1.0000 g | Freq: Once | INTRAMUSCULAR | Status: AC
  Administered 2019-12-21: 1 g via INTRAMUSCULAR
  Filled 2019-12-21: qty 10

## 2019-12-21 NOTE — Progress Notes (Signed)
VAST consulted to restart PIV. Left arm restricted d/t fistula.Right arm red,swollen,tight and painful.No suitable veins.Primary RN aware.Will notify MD.

## 2019-12-21 NOTE — Progress Notes (Signed)
Pt noted with low BP 87/56. Notified on call X. Blount,NP via text page and received new order to give bolus NS 538ml.

## 2019-12-21 NOTE — Progress Notes (Signed)
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  Pt is to have a central line placed Monday Dec 22, 2019.

## 2019-12-21 NOTE — Progress Notes (Signed)
Subjective:   Pt with R elbow/forearm cellulitis on IV abx per med team, originally consulted to r/o septic arthritis elbow or septic olecranon bursitis.   Patient reports pain as moderate.  She does note it has continued to improve over the last couple days.  She is able to move the arm and make a fist which she had difficulty with originally.  White count trending down. MRI from 1/28 showing no discrete fluid collection or deep bone infection just diffuse and fairly extensive subcutaneous soft tissue swelling/edema suggestive of cellulitis.   Objective: Vital signs in last 24 hours: Temp:  [98.5 F (36.9 C)-99.5 F (37.5 C)] 98.5 F (36.9 C) (01/31 0440) Pulse Rate:  [76-86] 81 (01/31 0541) Resp:  [16-20] 20 (01/31 0440) BP: (87-136)/(44-93) 105/50 (01/31 0541) SpO2:  [95 %-97 %] 97 % (01/31 0440)  Intake/Output from previous day: 01/30 0701 - 01/31 0700 In: 2834 [P.O.:480; I.V.:1607.4; IV Piggyback:746.6] Out: -  Intake/Output this shift: Total I/O In: 240 [P.O.:240] Out: -   Recent Labs    12/19/19 0203 12/20/19 0308 12/21/19 0427  HGB 14.0 11.4* 10.9*   Recent Labs    12/20/19 0308 12/21/19 0427  WBC 13.9* 9.9  RBC 4.27 4.09  HCT 33.7* 31.7*  PLT 103* 124*   Recent Labs    12/20/19 0308 12/21/19 0427  NA 133* 137  K 3.9 3.8  CL 102 105  CO2 25 23  BUN 11 11  CREATININE 1.20* 1.09*  GLUCOSE 109* 103*  CALCIUM 7.8* 8.2*   No results for input(s): LABPT, INR in the last 72 hours.  Neurovascular intact Sensation intact distally Intact pulses distally Compartment soft ROM elbow/wrist/hand intact, moderate warmth and erythema elbow and upper 1/3 or forearm, diffulely TTP, no fluctuance over olecranon bursa.   Assessment/Plan:    Right elbow/forearm cellulitis  I feel clinical picture fits exactly with MRI findings.  Do not appreciate any fluid to attempt to aspirate more diffuse soft tissue swelling and edema.  Continue on abx per med team, no further  recommendations from ortho at this time.     Vonna Kotyk Kelliann Pendergraph 12/21/2019, 2:15 PM

## 2019-12-21 NOTE — Progress Notes (Signed)
Consult for PIV obtained. 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 Per patient, has not been used for 8+ years. She's since had a renal transplant. With left arm restriction and a painful, swollen, and erythremic right arm from an PIV that infiltrated from another facility, [place], RN was informed of assessment from two [facility] members and recommend IV access be addressed by the primary physician.

## 2019-12-21 NOTE — Progress Notes (Signed)
Attending paged due to patient's IV infiltrating and IV team being unable to restart an IV. Patient's right arm is swollen and tight. Patient's left arm is restricted. Order placed for patient to receive a central line in Interventional Radiology. IR unable to place an IV today. Per MD request, another IV team consult was placed to assess patient's lower extremities for potential access until IR can place the central line. IV team consult placed.

## 2019-12-21 NOTE — Progress Notes (Addendum)
PROGRESS NOTE    [name]  [id number] DOB: 05-20-1941 DOA: 12/18/2019 PCP: No primary care provider on file.   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  Patient had a colonoscopy 2 days prior to admission, during the procedure patient had a temporary peripheral IV on the right breast and later removed after the procedure was done.  She went home and then the next day she developed pain and swelling in the right arm elbow area.  She also was having fever up to 103. Evaluation in the ED show a white blood cell at 19, CRP 12, lactic acid 1.8, BUN 13, creatinine 1.1.  Influenza panel negative respiratory panel negative.  Blood cultures pending.  Elbow x-ray; Findings consistent with olecranon bursitis and adjacent cellulitis. The bones are normal.   Assessment & Plan:   Active Problems:   Cellulitis   1-Olecranon bursitis, right arm cellulitis; MRI was negative for septic arthritis and or abscess. Continue with  Ceftriaxone. Change vancomycin to Doxy to avoid AKI Follow blood cultures: no growth to date.  Orthopedic following. Continue with pain management. Elevation  of the right arm. WBC normalized.  Hypotensive earlier this morning.  Right arm appears more swollen, still with redness. Will ask Ortho to follow up on patient.   2-s/p renal transplant: Continue with low-dose prednisone and Prograf. Patient relate that Imuran was to stop by her transplant doctor. Supposed to get lab work today.  I called transplant center at [facility].  Patient is supposed to get CMV DNA quantitative and BK quantitative. Results pending.  Creatinine stable at 1.0  3-Hypertension: Systolic blood pressure dropped.  Continue to hold Norvasc. Hypotensive earlier this morning. Received IV Bolus.   4-Asthma: Continue with as needed inhaler.  5-History of  coronary artery disease; asymptomatic currently.  Addendum; CCM is not able to do central line on the floor.  SBP stable so far .  Will order oral antibiotics for overnight.  Dr Bland Span IR is not able to place central line tonight.  IV team will try again, feet access?    Estimated body mass index is 29.23 kg/m as calculated from the following:   Height as of this encounter: 5\' 3"  (1.6 m).   Weight as of this encounter: 74.8 kg.   DVT prophylaxis: Heparin Code Status: Full code Family Communication: Care discussed with patient Disposition Plan:  Patient is from home. Suspect patient will be able to be discharged home Patient need to remain in the hospital for IV antibiotics, due to persistent Right arm swelling, edema. Patient was also hypotensive this am with SBP in the 80,.   Consultants:   Orthopedic  Procedures:   None  Antimicrobials:    Subjective: Still complaining of right arm pain, more swelling and redness.    Objective: Vitals:   12/20/19 1436 12/20/19 1947 12/21/19 0440 12/21/19 0541  BP: (!) 109/44 (!) 136/93 (!) 87/56 (!) 105/50  Pulse: 83 86 76 81  Resp: 16 20 20    Temp: 99.3 F (37.4 C) 99.5 F (37.5 C) 98.5 F (36.9 C)   TempSrc: Oral Oral Oral   SpO2: 96% 95% 97%   Weight:      Height:        Intake/Output Summary (Last 24 hours) at 12/21/2019 0818 Last data filed at 12/21/2019 0549 Gross per 24 hour  Intake 2834.02 ml  Output --  Net 2834.02  ml   Filed Weights   12/18/19 1235  Weight: 74.8 kg    Examination:  General exam: NAD Respiratory system: CTA Cardiovascular system:  S 1, S 2 RRR Gastrointestinal system: BS present, soft, nt Central nervous system: Non focal Extremities: Symmetric power Skin: Right arm with edema and redness    Data Reviewed: I have personally reviewed following labs and imaging studies  CBC: Recent Labs  Lab 12/18/19 1221 12/19/19 0203 12/20/19 0308 12/21/19 0427  WBC 19.2* 18.8* 13.9* 9.9   NEUTROABS 16.0*  --   --   --   HGB 14.0 14.0 11.4* 10.9*  HCT 40.8 39.8 33.7* 31.7*  MCV 77.9* 77.1* 78.9* 77.5*  PLT 129* 116* 103* [id number]*   Basic Metabolic Panel: Recent Labs  Lab 12/18/19 1221 12/19/19 0203 12/20/19 0308 12/21/19 0427  NA 139 136 133* 137  K 3.8 3.6 3.9 3.8  CL 102 102 102 105  CO2 27 24 25 23   GLUCOSE 172* 140* 109* 103*  BUN 13 11 11 11   CREATININE 1.18* 1.09* 1.20* 1.09*  CALCIUM 9.2 8.8* 7.8* 8.2*   GFR: Estimated Creatinine Clearance: 42.6 mL/min (A) (by C-G formula based on SCr of 1.09 mg/dL (H)). Liver Function Tests: Recent Labs  Lab 12/18/19 1221  AST 19  ALT 12  ALKPHOS 46  BILITOT 1.2  PROT 6.6  ALBUMIN 3.9   No results for input(s): LIPASE, AMYLASE in the last 168 hours. No results for input(s): AMMONIA in the last 168 hours. Coagulation Profile: No results for input(s): INR, PROTIME in the last 168 hours. Cardiac Enzymes: No results for input(s): CKTOTAL, CKMB, CKMBINDEX, TROPONINI in the last 168 hours. BNP (last 3 results) No results for input(s): PROBNP in the last 8760 hours. HbA1C: No results for input(s): HGBA1C in the last 72 hours. CBG: No results for input(s): GLUCAP in the last 168 hours. Lipid Profile: No results for input(s): CHOL, HDL, LDLCALC, TRIG, CHOLHDL, LDLDIRECT in the last 72 hours. Thyroid Function Tests: No results for input(s): TSH, T4TOTAL, FREET4, T3FREE, THYROIDAB in the last 72 hours. Anemia Panel: No results for input(s): VITAMINB12, FOLATE, FERRITIN, TIBC, IRON, RETICCTPCT in the last 72 hours. Sepsis Labs: Recent Labs  Lab 12/18/19 1221 12/18/19 1902  LATICACIDVEN 1.8 1.9    Recent Results (from the past 240 hour(s))  Culture, blood (routine x 2)     Status: None (Preliminary result)   Collection Time: 12/18/19 12:23 PM   Specimen: BLOOD  Result Value Ref Range Status   Specimen Description BLOOD SITE NOT SPECIFIED  Final   Special Requests   Final    BOTTLES DRAWN AEROBIC AND  ANAEROBIC Blood Culture adequate volume   Culture   Final    NO GROWTH 2 DAYS Performed at [facility], [place] [address], [place], [place] [address]    Report Status PENDING  Incomplete  Respiratory Panel by RT PCR (Flu A&B, Covid) - Nasopharyngeal Swab     Status: None   Collection Time: 12/18/19 12:24 PM   Specimen: Nasopharyngeal Swab  Result Value Ref Range Status   SARS Coronavirus 2 by RT PCR NEGATIVE NEGATIVE Final    Comment: (NOTE) SARS-CoV-2 target nucleic acids are NOT DETECTED. The SARS-CoV-2 RNA is generally detectable in upper respiratoy specimens during the acute phase of infection. The lowest concentration of SARS-CoV-2 viral copies this assay can detect is 131 copies/mL. A negative result does not preclude SARS-Cov-2 infection and should not be used as the sole basis for treatment  or other patient management decisions. A negative result may occur with  improper specimen collection/handling, submission of specimen other than nasopharyngeal swab, presence of viral mutation(s) within the areas targeted by this assay, and inadequate number of viral copies (<131 copies/mL). A negative result must be combined with clinical observations, patient history, and epidemiological information. The expected result is Negative. Fact Sheet for Patients:  PinkCheek.be Fact Sheet for Healthcare Providers:  GravelBags.it This test is not yet ap proved or cleared by the [place] FDA and  has been authorized for detection and/or diagnosis of SARS-CoV-2 by FDA under an Emergency Use Authorization (EUA). This EUA will remain  in effect (meaning this test can be used) for the duration of the COVID-19 declaration under Section 564(b)(1) of the Act, 21 U.S.C. section 360bbb-3(b)(1), unless the authorization is terminated or revoked sooner.    Influenza A by PCR NEGATIVE NEGATIVE Final   Influenza B by PCR NEGATIVE NEGATIVE  Final    Comment: (NOTE) The Xpert Xpress SARS-CoV-2/FLU/RSV assay is intended as an aid in  the diagnosis of influenza from Nasopharyngeal swab specimens and  should not be used as a sole basis for treatment. Nasal washings and  aspirates are unacceptable for Xpert Xpress SARS-CoV-2/FLU/RSV  testing. Fact Sheet for Patients: PinkCheek.be Fact Sheet for Healthcare Providers: GravelBags.it This test is not yet approved or cleared by the [place] FDA and  has been authorized for detection and/or diagnosis of SARS-CoV-2 by  FDA under an Emergency Use Authorization (EUA). This EUA will remain  in effect (meaning this test can be used) for the duration of the  Covid-19 declaration under Section 564(b)(1) of the Act, 21  U.S.C. section 360bbb-3(b)(1), unless the authorization is  terminated or revoked. Performed at [facility], [place] [address] [place], [place] [address]   MRSA PCR Screening     Status: None   Collection Time: 12/18/19  6:00 PM   Specimen: Nasopharyngeal  Result Value Ref Range Status   MRSA by PCR NEGATIVE NEGATIVE Final    Comment:        The GeneXpert MRSA Assay (FDA approved for NASAL specimens only), is one component of a comprehensive MRSA colonization surveillance program. It is not intended to diagnose MRSA infection nor to guide or monitor treatment for MRSA infections. Performed at [facility], [place] [address], [place], [place] [address]   Culture, blood (routine x 2)     Status: None (Preliminary result)   Collection Time: 12/18/19  7:02 PM   Specimen: BLOOD RIGHT HAND  Result Value Ref Range Status   Specimen Description BLOOD RIGHT HAND  Final   Special Requests   Final    BOTTLES DRAWN AEROBIC ONLY Blood Culture results may not be optimal due to an inadequate volume of blood received in culture bottles   Culture   Final    NO GROWTH 2 DAYS Performed at [facility] [place], [place] [address] [place], [place] [address]    Report Status PENDING  Incomplete         Radiology Studies: No results found.      Scheduled Meds: . aspirin EC  81 mg Oral Daily  . calcitRIOL  0.25 mcg Oral Daily  . fluticasone furoate-vilanterol  1 puff Inhalation Daily  . heparin  5,000 Units Subcutaneous Q12H  . pantoprazole  40 mg Oral Daily  . predniSONE  5 mg Oral Daily  . rosuvastatin  10 mg Oral QPM  . tacrolimus  5 mg Oral BID  . umeclidinium bromide  1 puff Inhalation Daily   Continuous Infusions: . sodium chloride 75 mL/hr at 12/21/19 0436  . cefTRIAXone (ROCEPHIN)  IV 1 g (12/20/19 1348)  . doxycycline (VIBRAMYCIN) IV 100 mg (12/20/19 2014)     LOS: 3 days    Time spent: 35 minutes     Khara Renaud A Tyreak Reagle, MD [facility]   If 7PM-7AM, please contact night-coverage  Password [facility] 12/21/2019, 8:18 AM

## 2019-12-22 ENCOUNTER — Inpatient Hospital Stay (HOSPITAL_COMMUNITY): Payer: Medicare HMO

## 2019-12-22 HISTORY — PX: IR FLUORO GUIDE CV LINE RIGHT: IMG2283

## 2019-12-22 HISTORY — PX: IR US GUIDE VASC ACCESS RIGHT: IMG2390

## 2019-12-22 MED ORDER — SODIUM CHLORIDE 0.9 % IV SOLN: 100.0000 mg | Freq: Two times a day (BID) | INTRAVENOUS | Status: DC

## 2019-12-22 MED ORDER — LIDOCAINE HCL 1 % IJ SOLN
INTRAMUSCULAR | Status: AC | PRN
  Administered 2019-12-22: 10 mL

## 2019-12-22 MED ORDER — CHLORHEXIDINE GLUCONATE CLOTH 2 % EX PADS
6.0000 | MEDICATED_PAD | Freq: Every day | CUTANEOUS | Status: DC
  Administered 2019-12-22 – 2019-12-27 (×6): 6 via TOPICAL

## 2019-12-22 MED ORDER — DOXYCYCLINE HYCLATE 100 MG PO TABS
100.0000 mg | ORAL_TABLET | Freq: Two times a day (BID) | ORAL | Status: DC
  Administered 2019-12-22 – 2019-12-27 (×10): 100 mg via ORAL
  Filled 2019-12-22 (×11): qty 1

## 2019-12-22 MED ORDER — HEPARIN SODIUM (PORCINE) 5000 UNIT/ML IJ SOLN
5000.0000 [IU] | Freq: Three times a day (TID) | INTRAMUSCULAR | Status: DC
  Administered 2019-12-22 – 2019-12-23 (×3): 5000 [IU] via SUBCUTANEOUS
  Filled 2019-12-22 (×3): qty 1

## 2019-12-22 MED ORDER — LIDOCAINE HCL 1 % IJ SOLN
INTRAMUSCULAR | Status: AC
  Filled 2019-12-22: qty 20

## 2019-12-22 NOTE — Procedures (Signed)
Interventional Radiology Procedure Note  Procedure: Placement of a 15 cm dual lumen PowerLine via the right IJ.  Tips at the cavoatrial jxn and ready for use.   Complications: None  Estimated Blood Loss: None  Recommendations: - Routine line care  Signed,  Criselda Peaches, MD

## 2019-12-22 NOTE — Progress Notes (Signed)
PROGRESS NOTE    [name]  [id number] DOB: September 27, 1941 DOA: 12/18/2019 PCP: No primary care provider on file.   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  Patient had a colonoscopy 2 days prior to admission, during the procedure patient had a temporary peripheral IV on the right breast and later removed after the procedure was done.  She went home and then the next day she developed pain and swelling in the right arm elbow area.  She also was having fever up to 103. Evaluation in the ED show a white blood cell at 19, CRP 12, lactic acid 1.8, BUN 13, creatinine 1.1.  Influenza panel negative respiratory panel negative.  Blood cultures pending.  Elbow x-ray; Findings consistent with olecranon bursitis and adjacent cellulitis. The bones are normal.   Assessment & Plan:   Active Problems:   Cellulitis   1-Olecranon bursitis, right arm cellulitis; MRI was negative for septic arthritis and or abscess. Continue with  Ceftriaxone. Change vancomycin to Doxy to avoid AKI Follow blood cultures: no growth to date.  Orthopedic following. Continue with pain management. Elevation  of the right arm. WBC normalized.  No IV access , IR will place central line.  Needs to continue with IV antibiotics   2-s/p renal transplant: Continue with low-dose prednisone and Prograf. Patient relate that Imuran was to stop by her transplant doctor. Supposed to get lab work today.  I called transplant center at [facility].  Patient is supposed to get CMV DNA quantitative and BK quantitative. Results pending.  Creatinine stable at 1.0  3-Hypertension: Systolic blood pressure dropped.  Continue to hold Norvasc. Hypotension; resolved.   4-Asthma: Continue with as needed inhaler.  5-History of coronary artery disease; asymptomatic currently.   Estimated body mass  index is 29.23 kg/m as calculated from the following:   Height as of this encounter: 5\' 3"  (1.6 m).   Weight as of this encounter: 74.8 kg.   DVT prophylaxis: Heparin Code Status: Full code Family Communication: Care discussed with patient Disposition Plan:  Patient is from home. Suspect patient will be able to be discharged home Patient need to remain in the hospital for IV antibiotics, due to persistent Right arm swelling, redness.   Consultants:   Orthopedic  Procedures:   None  Antimicrobials:    Subjective: Pain has improved, still with significant redness, less edema  Objective: Vitals:   12/21/19 1501 12/21/19 2106 12/22/19 0636 12/22/19 1419  BP: (!) 101/57 (!) 157/62 110/64 (!) 148/59  Pulse: 80 (!) 107 82 79  Resp: 20 16 20 17   Temp: 99 F (37.2 C) 98.5 F (36.9 C) 99 F (37.2 C) 98.9 F (37.2 C)  TempSrc: Oral Oral Oral Oral  SpO2: 98% 96% 100% 98%  Weight:      Height:        Intake/Output Summary (Last 24 hours) at 12/22/2019 1536 Last data filed at 12/22/2019 1230 Gross per 24 hour  Intake 720 ml  Output 1 ml  Net 719 ml   Filed Weights   12/18/19 1235  Weight: 74.8 kg    Examination:  General exam: NAD Respiratory system: CTA Cardiovascular system:  S 1, S 2 RRR Gastrointestinal system: BS present, soft, nt Central nervous system: non focal.  Extremities: Symmetric power Skin: Right arm with less edema , still with significant redenss    Data Reviewed: I have personally reviewed  following labs and imaging studies  CBC: Recent Labs  Lab 12/18/19 1221 12/19/19 0203 12/20/19 0308 12/21/19 0427  WBC 19.2* 18.8* 13.9* 9.9  NEUTROABS 16.0*  --   --   --   HGB 14.0 14.0 11.4* 10.9*  HCT 40.8 39.8 33.7* 31.7*  MCV 77.9* 77.1* 78.9* 77.5*  PLT 129* 116* 103* [id number]*   Basic Metabolic Panel: Recent Labs  Lab 12/18/19 1221 12/19/19 0203 12/20/19 0308 12/21/19 0427  NA 139 136 133* 137  K 3.8 3.6 3.9 3.8  CL 102 102 102 105    CO2 27 24 25 23   GLUCOSE 172* 140* 109* 103*  BUN 13 11 11 11   CREATININE 1.18* 1.09* 1.20* 1.09*  CALCIUM 9.2 8.8* 7.8* 8.2*   GFR: Estimated Creatinine Clearance: 42.6 mL/min (A) (by C-G formula based on SCr of 1.09 mg/dL (H)). Liver Function Tests: Recent Labs  Lab 12/18/19 1221  AST 19  ALT 12  ALKPHOS 46  BILITOT 1.2  PROT 6.6  ALBUMIN 3.9   No results for input(s): LIPASE, AMYLASE in the last 168 hours. No results for input(s): AMMONIA in the last 168 hours. Coagulation Profile: No results for input(s): INR, PROTIME in the last 168 hours. Cardiac Enzymes: No results for input(s): CKTOTAL, CKMB, CKMBINDEX, TROPONINI in the last 168 hours. BNP (last 3 results) No results for input(s): PROBNP in the last 8760 hours. HbA1C: No results for input(s): HGBA1C in the last 72 hours. CBG: No results for input(s): GLUCAP in the last 168 hours. Lipid Profile: No results for input(s): CHOL, HDL, LDLCALC, TRIG, CHOLHDL, LDLDIRECT in the last 72 hours. Thyroid Function Tests: No results for input(s): TSH, T4TOTAL, FREET4, T3FREE, THYROIDAB in the last 72 hours. Anemia Panel: No results for input(s): VITAMINB12, FOLATE, FERRITIN, TIBC, IRON, RETICCTPCT in the last 72 hours. Sepsis Labs: Recent Labs  Lab 12/18/19 1221 12/18/19 1902  LATICACIDVEN 1.8 1.9    Recent Results (from the past 240 hour(s))  Culture, blood (routine x 2)     Status: None (Preliminary result)   Collection Time: 12/18/19 12:23 PM   Specimen: BLOOD  Result Value Ref Range Status   Specimen Description BLOOD SITE NOT SPECIFIED  Final   Special Requests   Final    BOTTLES DRAWN AEROBIC AND ANAEROBIC Blood Culture adequate volume   Culture   Final    NO GROWTH 4 DAYS Performed at [facility], [place] [address] [place], [place] [address]    Report Status PENDING  Incomplete  Respiratory Panel by RT PCR (Flu A&B, Covid) - Nasopharyngeal Swab     Status: None   Collection Time: 12/18/19 12:24 PM    Specimen: Nasopharyngeal Swab  Result Value Ref Range Status   SARS Coronavirus 2 by RT PCR NEGATIVE NEGATIVE Final    Comment: (NOTE) SARS-CoV-2 target nucleic acids are NOT DETECTED. The SARS-CoV-2 RNA is generally detectable in upper respiratoy specimens during the acute phase of infection. The lowest concentration of SARS-CoV-2 viral copies this assay can detect is 131 copies/mL. A negative result does not preclude SARS-Cov-2 infection and should not be used as the sole basis for treatment or other patient management decisions. A negative result may occur with  improper specimen collection/handling, submission of specimen other than nasopharyngeal swab, presence of viral mutation(s) within the areas targeted by this assay, and inadequate number of viral copies (<131 copies/mL). A negative result must be combined with clinical observations, patient history, and epidemiological information. The expected result is Negative. Fact  Sheet for Patients:  PinkCheek.be Fact Sheet for Healthcare Providers:  GravelBags.it This test is not yet ap proved or cleared by the [place] FDA and  has been authorized for detection and/or diagnosis of SARS-CoV-2 by FDA under an Emergency Use Authorization (EUA). This EUA will remain  in effect (meaning this test can be used) for the duration of the COVID-19 declaration under Section 564(b)(1) of the Act, 21 U.S.C. section 360bbb-3(b)(1), unless the authorization is terminated or revoked sooner.    Influenza A by PCR NEGATIVE NEGATIVE Final   Influenza B by PCR NEGATIVE NEGATIVE Final    Comment: (NOTE) The Xpert Xpress SARS-CoV-2/FLU/RSV assay is intended as an aid in  the diagnosis of influenza from Nasopharyngeal swab specimens and  should not be used as a sole basis for treatment. Nasal washings and  aspirates are unacceptable for Xpert Xpress SARS-CoV-2/FLU/RSV  testing. Fact Sheet  for Patients: PinkCheek.be Fact Sheet for Healthcare Providers: GravelBags.it This test is not yet approved or cleared by the [place] FDA and  has been authorized for detection and/or diagnosis of SARS-CoV-2 by  FDA under an Emergency Use Authorization (EUA). This EUA will remain  in effect (meaning this test can be used) for the duration of the  Covid-19 declaration under Section 564(b)(1) of the Act, 21  U.S.C. section 360bbb-3(b)(1), unless the authorization is  terminated or revoked. Performed at [facility], [place] [address], [place], [place] [address]   MRSA PCR Screening     Status: None   Collection Time: 12/18/19  6:00 PM   Specimen: Nasopharyngeal  Result Value Ref Range Status   MRSA by PCR NEGATIVE NEGATIVE Final    Comment:        The GeneXpert MRSA Assay (FDA approved for NASAL specimens only), is one component of a comprehensive MRSA colonization surveillance program. It is not intended to diagnose MRSA infection nor to guide or monitor treatment for MRSA infections. Performed at [facility], [place] [address] [place], [place] [address]   Culture, blood (routine x 2)     Status: None (Preliminary result)   Collection Time: 12/18/19  7:02 PM   Specimen: BLOOD RIGHT HAND  Result Value Ref Range Status   Specimen Description BLOOD RIGHT HAND  Final   Special Requests   Final    BOTTLES DRAWN AEROBIC ONLY Blood Culture results may not be optimal due to an inadequate volume of blood received in culture bottles   Culture   Final    NO GROWTH 4 DAYS Performed at [facility], [place] [address] [place], [place] [address]    Report Status PENDING  Incomplete         Radiology Studies: No results found.      Scheduled Meds: . aspirin EC  81 mg Oral Daily  . calcitRIOL  0.25 mcg Oral Daily  . doxycycline  100 mg Oral Q12H  . fluticasone furoate-vilanterol  1 puff Inhalation  Daily  . heparin  5,000 Units Subcutaneous Q8H  . pantoprazole  40 mg Oral Daily  . predniSONE  5 mg Oral Daily  . rosuvastatin  10 mg Oral QPM  . tacrolimus  5 mg Oral BID  . umeclidinium bromide  1 puff Inhalation Daily   Continuous Infusions: . cefTRIAXone (ROCEPHIN)  IV Stopped (12/21/19 1841)     LOS: 4 days    Time spent: 35 minutes     Makhya Arave Desiree Lucy, MD [facility]   If  7PM-7AM, please contact night-coverage  Password [facility] 12/22/2019, 3:36 PM

## 2019-12-23 LAB — CBC
HCT: 33 % — ABNORMAL LOW (ref 36.0–46.0)
Hemoglobin: 11.4 g/dL — ABNORMAL LOW (ref 12.0–15.0)
MCH: 26.8 pg (ref 26.0–34.0)
MCHC: 34.5 g/dL (ref 30.0–36.0)
MCV: 77.6 fL — ABNORMAL LOW (ref 80.0–100.0)
Platelets: 147 10*3/uL — ABNORMAL LOW (ref 150–400)
RBC: 4.25 MIL/uL (ref 3.87–5.11)
RDW: 14.7 % (ref 11.5–15.5)
WBC: 9.4 10*3/uL (ref 4.0–10.5)
nRBC: 0 % (ref 0.0–0.2)

## 2019-12-23 LAB — BK QUANT PCR (PLASMA/SERUM)
BK Quantitaion PCR: NEGATIVE copies/mL
Log10 BK Qn PCR: UNDETERMINED log10copy/mL

## 2019-12-23 LAB — BASIC METABOLIC PANEL
Anion gap: 10 (ref 5–15)
BUN: 8 mg/dL (ref 8–23)
CO2: 27 mmol/L (ref 22–32)
Calcium: 9.1 mg/dL (ref 8.9–10.3)
Chloride: 102 mmol/L (ref 98–111)
Creatinine, Ser: 1.11 mg/dL — ABNORMAL HIGH (ref 0.44–1.00)
GFR calc Af Amer: 56 mL/min — ABNORMAL LOW (ref >60)
GFR calc non Af Amer: 48 mL/min — ABNORMAL LOW (ref >60)
Glucose, Bld: 124 mg/dL — ABNORMAL HIGH (ref 70–99)
Potassium: 3.5 mmol/L (ref 3.5–5.1)
Sodium: 139 mmol/L (ref 135–145)

## 2019-12-23 LAB — CULTURE, BLOOD (ROUTINE X 2)
Culture: NO GROWTH
Culture: NO GROWTH
Special Requests: ADEQUATE

## 2019-12-23 MED ORDER — POLYETHYLENE GLYCOL 3350 17 G PO PACK
17.0000 g | PACK | Freq: Every day | ORAL | Status: DC
  Administered 2019-12-24 – 2019-12-27 (×4): 17 g via ORAL
  Filled 2019-12-23 (×4): qty 1

## 2019-12-23 NOTE — Progress Notes (Addendum)
PROGRESS NOTE    [name]  [id number] DOB: 03-28-1941 DOA: 12/18/2019 PCP: No primary care provider on file.   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  Patient had a colonoscopy 2 days prior to admission, during the procedure patient had a temporary peripheral IV on the right breast and later removed after the procedure was done.  She went home and then the next day she developed pain and swelling in the right arm elbow area.  She also was having fever up to 103. Evaluation in the ED show a white blood cell at 19, CRP 12, lactic acid 1.8, BUN 13, creatinine 1.1.  Influenza panel negative respiratory panel negative.  Blood cultures pending.  Elbow x-ray; Findings consistent with olecranon bursitis and adjacent cellulitis. The bones are normal.  Patient admitted with right arm cellulitis, patient on immunosuppressive medication due to her history of renal transplant.  She has a slowly improving on IV antibiotics.  She will likely benefit from few more days of IV antibiotics, her right arm is still very red.  Assessment & Plan:   Active Problems:   Cellulitis   1-Olecranon bursitis, right arm cellulitis; related to previous IV access place for procedure.  MRI was negative for septic arthritis and or abscess. Continue with  Ceftriaxone. Change vancomycin to Doxy to avoid AKI Follow blood cultures: no growth to date.  Orthopedic following. Continue with pain management. Elevation  of the right arm. WBC normalized.  Trauma in place by IR on 12/22/2019. We will continue with IV antibiotics for 2-3 more days, due to immunosuppressive status, persistent redness, pain  and edema of the right arm.  2-s/p renal transplant: -Continue with low-dose prednisone and Prograf. -Patient relate that Imuran was to stop by her transplant  doctor. -Supposed to get lab work 1-31.  I called transplant center at [facility]  Patient is supposed to get CMV DNA quantitative and BK quantitative. Results pending.  -Creatinine remains stable  3-Hypertension: Systolic blood pressure dropped.  Continue to hold Norvasc. Hypotension; resolved.   4-Asthma: Continue with as needed inhaler.  5-History of coronary artery disease; asymptomatic currently.   Estimated body mass index is 29.23 kg/m as calculated from the following:   Height as of this encounter: 5\' 3"  (1.6 m).   Weight as of this encounter: 74.8 kg.   DVT prophylaxis: Heparin Code Status: Full code Family Communication: Care discussed with patient Disposition Plan:  Patient is from home. Suspect patient will be able to be discharged home Patient need to remain in the hospital for IV antibiotics, due to persistent Right arm swelling, redness, and immunosuppressive status.  Consultants:   Orthopedic  Procedures:   None  Antimicrobials:    Subjective: Patient reported mild improvement of right arm pain, swelling has decreased, redness persist  Objective: Vitals:   12/22/19 2032 12/23/19 0434 12/23/19 0827 12/23/19 1000  BP: (!) 91/32 114/63  (!) 128/113  Pulse: 74 82  81  Resp: 14 16    Temp: 99.2 F (37.3 C) 98.3 F (36.8 C)  98.6 F (37 C)  TempSrc: Oral Oral  Oral  SpO2: 98% 97% 98% 100%  Weight:      Height:        Intake/Output Summary (Last 24 hours) at 12/23/2019 1046 Last data filed at 12/22/2019 1230 Gross per 24 hour  Intake 240 ml  Output --  Net 240  ml   Filed Weights   12/18/19 1235  Weight: 74.8 kg    Examination:  General exam: NAD Respiratory system: CTA Cardiovascular system:  S 1, S 2 RRR Gastrointestinal system: BS present, soft, nt Central nervous system: Non focal.   Extremities: Symmetric power.  Skin: Right arm with persistent redness and pain, less edema    Data Reviewed: I have personally reviewed following labs  and imaging studies  CBC: Recent Labs  Lab 12/18/19 1221 12/19/19 0203 12/20/19 0308 12/21/19 0427 12/23/19 0455  WBC 19.2* 18.8* 13.9* 9.9 9.4  NEUTROABS 16.0*  --   --   --   --   HGB 14.0 14.0 11.4* 10.9* 11.4*  HCT 40.8 39.8 33.7* 31.7* 33.0*  MCV 77.9* 77.1* 78.9* 77.5* 77.6*  PLT 129* 116* 103* 124* [id number]*   Basic Metabolic Panel: Recent Labs  Lab 12/18/19 1221 12/19/19 0203 12/20/19 0308 12/21/19 0427 12/23/19 0455  NA 139 136 133* 137 139  K 3.8 3.6 3.9 3.8 3.5  CL 102 102 102 105 102  CO2 27 24 25 23 27   GLUCOSE 172* 140* 109* 103* 124*  BUN 13 11 11 11 8   CREATININE 1.18* 1.09* 1.20* 1.09* 1.11*  CALCIUM 9.2 8.8* 7.8* 8.2* 9.1   GFR: Estimated Creatinine Clearance: 41.8 mL/min (A) (by C-G formula based on SCr of 1.11 mg/dL (H)). Liver Function Tests: Recent Labs  Lab 12/18/19 1221  AST 19  ALT 12  ALKPHOS 46  BILITOT 1.2  PROT 6.6  ALBUMIN 3.9   No results for input(s): LIPASE, AMYLASE in the last 168 hours. No results for input(s): AMMONIA in the last 168 hours. Coagulation Profile: No results for input(s): INR, PROTIME in the last 168 hours. Cardiac Enzymes: No results for input(s): CKTOTAL, CKMB, CKMBINDEX, TROPONINI in the last 168 hours. BNP (last 3 results) No results for input(s): PROBNP in the last 8760 hours. HbA1C: No results for input(s): HGBA1C in the last 72 hours. CBG: No results for input(s): GLUCAP in the last 168 hours. Lipid Profile: No results for input(s): CHOL, HDL, LDLCALC, TRIG, CHOLHDL, LDLDIRECT in the last 72 hours. Thyroid Function Tests: No results for input(s): TSH, T4TOTAL, FREET4, T3FREE, THYROIDAB in the last 72 hours. Anemia Panel: No results for input(s): VITAMINB12, FOLATE, FERRITIN, TIBC, IRON, RETICCTPCT in the last 72 hours. Sepsis Labs: Recent Labs  Lab 12/18/19 1221 12/18/19 1902  LATICACIDVEN 1.8 1.9    Recent Results (from the past 240 hour(s))  Culture, blood (routine x 2)     Status: None    Collection Time: 12/18/19 12:23 PM   Specimen: BLOOD  Result Value Ref Range Status   Specimen Description BLOOD SITE NOT SPECIFIED  Final   Special Requests   Final    BOTTLES DRAWN AEROBIC AND ANAEROBIC Blood Culture adequate volume Performed at [facility], [place] [address] [place], [place] [address]    Culture NO GROWTH 5 DAYS  Final   Report Status 12/23/2019 FINAL  Final  Respiratory Panel by RT PCR (Flu A&B, Covid) - Nasopharyngeal Swab     Status: None   Collection Time: 12/18/19 12:24 PM   Specimen: Nasopharyngeal Swab  Result Value Ref Range Status   SARS Coronavirus 2 by RT PCR NEGATIVE NEGATIVE Final    Comment: (NOTE) SARS-CoV-2 target nucleic acids are NOT DETECTED. The SARS-CoV-2 RNA is generally detectable in upper respiratoy specimens during the acute phase of infection. The lowest concentration of SARS-CoV-2 viral copies this assay can detect is  131 copies/mL. A negative result does not preclude SARS-Cov-2 infection and should not be used as the sole basis for treatment or other patient management decisions. A negative result may occur with  improper specimen collection/handling, submission of specimen other than nasopharyngeal swab, presence of viral mutation(s) within the areas targeted by this assay, and inadequate number of viral copies (<131 copies/mL). A negative result must be combined with clinical observations, patient history, and epidemiological information. The expected result is Negative. Fact Sheet for Patients:  PinkCheek.be Fact Sheet for Healthcare Providers:  GravelBags.it This test is not yet ap proved or cleared by the [place] FDA and  has been authorized for detection and/or diagnosis of SARS-CoV-2 by FDA under an Emergency Use Authorization (EUA). This EUA will remain  in effect (meaning this test can be used) for the duration of the COVID-19 declaration under Section  564(b)(1) of the Act, 21 U.S.C. section 360bbb-3(b)(1), unless the authorization is terminated or revoked sooner.    Influenza A by PCR NEGATIVE NEGATIVE Final   Influenza B by PCR NEGATIVE NEGATIVE Final    Comment: (NOTE) The Xpert Xpress SARS-CoV-2/FLU/RSV assay is intended as an aid in  the diagnosis of influenza from Nasopharyngeal swab specimens and  should not be used as a sole basis for treatment. Nasal washings and  aspirates are unacceptable for Xpert Xpress SARS-CoV-2/FLU/RSV  testing. Fact Sheet for Patients: PinkCheek.be Fact Sheet for Healthcare Providers: GravelBags.it This test is not yet approved or cleared by the [place] FDA and  has been authorized for detection and/or diagnosis of SARS-CoV-2 by  FDA under an Emergency Use Authorization (EUA). This EUA will remain  in effect (meaning this test can be used) for the duration of the  Covid-19 declaration under Section 564(b)(1) of the Act, 21  U.S.C. section 360bbb-3(b)(1), unless the authorization is  terminated or revoked. Performed at [facility], [place] [address], [place], [place] [address]   MRSA PCR Screening     Status: None   Collection Time: 12/18/19  6:00 PM   Specimen: Nasopharyngeal  Result Value Ref Range Status   MRSA by PCR NEGATIVE NEGATIVE Final    Comment:        The GeneXpert MRSA Assay (FDA approved for NASAL specimens only), is one component of a comprehensive MRSA colonization surveillance program. It is not intended to diagnose MRSA infection nor to guide or monitor treatment for MRSA infections. Performed at [facility], [place] [address] [place], [place] [address]   Culture, blood (routine x 2)     Status: None   Collection Time: 12/18/19  7:02 PM   Specimen: BLOOD RIGHT HAND  Result Value Ref Range Status   Specimen Description BLOOD RIGHT HAND  Final   Special Requests   Final    BOTTLES DRAWN AEROBIC  ONLY Blood Culture results may not be optimal due to an inadequate volume of blood received in culture bottles Performed at [facility], [place] [address] [place],  [address]    Culture NO GROWTH 5 DAYS  Final   Report Status 12/23/2019 FINAL  Final         Radiology Studies: IR Fluoro Guide CV Line Right  Result Date: 12/22/2019 INDICATION: 79 year old female with poor venous access in need of IV access. EXAM: IR ULTRASOUND GUIDANCE VASC ACCESS RIGHT; IR RIGHT FLUORO GUIDE CV LINE MEDICATIONS: None ANESTHESIA/SEDATION: None FLUOROSCOPY TIME:  Fluoroscopy Time: 0 minutes 6 seconds (1 mGy). COMPLICATIONS: None immediate. PROCEDURE:  Informed written consent was obtained from the patient after a thorough discussion of the procedural risks, benefits and alternatives. All questions were addressed. Maximal Sterile Barrier Technique was utilized including caps, mask, sterile gowns, sterile gloves, sterile drape, hand hygiene and skin antiseptic. A timeout was performed prior to the initiation of the procedure. The right internal jugular vein was interrogated with ultrasound and found to be widely patent. An image was obtained and stored for the medical record. Local anesthesia was attained by infiltration with 1% lidocaine. A small dermatotomy was made. Under real-time sonographic guidance, the vessel was punctured with a 21 gauge micropuncture needle. Using standard technique, the initial micro needle was exchanged over a 0.018 micro wire for a peel-away sheath. The 0.018 wire was then used to measure the intravascular length to the superior cavoatrial junction. A dual lumen power injectable central venous catheter was then cut to 15 cm and advanced through the peel-away sheath. The peel-away sheath was discarded. A fluoroscopic spot image was obtained documenting the catheter tips at the cavoatrial junction. The catheter was flushed and locked with valved caps. The catheter was secured to the  skin with 0 Prolene suture. Sterile bandages were applied. IMPRESSION: Successful placement of a right IJ approach dual lumen power injectable central line. The line is 15 cm in length and the catheter tips are at the superior cavoatrial junction. The catheter is ready for immediate use. Electronically Signed   By: Jacqulynn Cadet M.D.   On: 12/22/2019 16:39   IR US Guide Vasc Access Right  Result Date: 12/22/2019 INDICATION: 79 year old female with poor venous access in need of IV access. EXAM: IR ULTRASOUND GUIDANCE VASC ACCESS RIGHT; IR RIGHT FLUORO GUIDE CV LINE MEDICATIONS: None ANESTHESIA/SEDATION: None FLUOROSCOPY TIME:  Fluoroscopy Time: 0 minutes 6 seconds (1 mGy). COMPLICATIONS: None immediate. PROCEDURE: Informed written consent was obtained from the patient after a thorough discussion of the procedural risks, benefits and alternatives. All questions were addressed. Maximal Sterile Barrier Technique was utilized including caps, mask, sterile gowns, sterile gloves, sterile drape, hand hygiene and skin antiseptic. A timeout was performed prior to the initiation of the procedure. The right internal jugular vein was interrogated with ultrasound and found to be widely patent. An image was obtained and stored for the medical record. Local anesthesia was attained by infiltration with 1% lidocaine. A small dermatotomy was made. Under real-time sonographic guidance, the vessel was punctured with a 21 gauge micropuncture needle. Using standard technique, the initial micro needle was exchanged over a 0.018 micro wire for a peel-away sheath. The 0.018 wire was then used to measure the intravascular length to the superior cavoatrial junction. A dual lumen power injectable central venous catheter was then cut to 15 cm and advanced through the peel-away sheath. The peel-away sheath was discarded. A fluoroscopic spot image was obtained documenting the catheter tips at the cavoatrial junction. The catheter was  flushed and locked with valved caps. The catheter was secured to the skin with 0 Prolene suture. Sterile bandages were applied. IMPRESSION: Successful placement of a right IJ approach dual lumen power injectable central line. The line is 15 cm in length and the catheter tips are at the superior cavoatrial junction. The catheter is ready for immediate use. Electronically Signed   By: Jacqulynn Cadet M.D.   On: 12/22/2019 16:39        Scheduled Meds: . calcitRIOL  0.25 mcg Oral Daily  . Chlorhexidine Gluconate Cloth  6 each Topical Daily  . doxycycline  100 mg Oral Q12H  . fluticasone furoate-vilanterol  1 puff Inhalation Daily  . pantoprazole  40 mg Oral Daily  . polyethylene glycol  17 g Oral Daily  . predniSONE  5 mg Oral Daily  . rosuvastatin  10 mg Oral QPM  . tacrolimus  5 mg Oral BID  . umeclidinium bromide  1 puff Inhalation Daily   Continuous Infusions: . cefTRIAXone (ROCEPHIN)  IV 1 g (12/22/19 1634)     LOS: 5 days    Time spent: 35 minutes     Abbegail Matuska A Tallia Moehring, MD [facility]   If 7PM-7AM, please contact night-coverage  Password [facility] 12/23/2019, 10:46 AM

## 2019-12-23 NOTE — Evaluation (Signed)
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  Patient had a colonoscopy 2 days prior to admission, during the procedure patient had a temporary peripheral IV on the right  and later removed after the procedure was done.  She went home and then the next day she developed pain and swelling in the right arm elbow area.  She also was having fever up to 103.   Clinical Impression   Patient pleasant and cooperative with therapy. Patient demonstrates decreased AROM and strength in R UE due to edema, pain with hypersensitivity to light touch. Educate patient on edema reduction to elevate extremity, perform A/AROM exercises and use of ice for comfort. Patient able to perform AAROM currently with assist from L UE to mobilize R arm. Min guard for functional transfers for safety due to mild unsteadiness. Recommend continued acute OT services to maximize patient independence with self care and functional transfers.    Follow Up Recommendations  Home health OT;Supervision - Intermittent    Equipment Recommendations  None recommended by OT       Precautions / Restrictions Precautions Precautions: Fall Precaution Comments: Keep RUE elevated Restrictions Weight Bearing Restrictions: No      Mobility Bed Mobility               General bed mobility comments: up to chair upon arrival  Transfers Overall transfer level: Needs assistance Equipment used: Rolling walker (2 wheeled) Transfers: Sit to/from Stand Sit to Stand: Min guard         General transfer comment: min guard for safety as patient is mildly unsteady    Balance Overall balance assessment: Needs assistance Sitting-balance support: Feet supported;No  upper extremity supported Sitting balance-Leahy Scale: Good     Standing balance support: Bilateral upper extremity supported;During functional activity Standing balance-Leahy Scale: Poor Standing balance comment: reliant on B UE support                           ADL either performed or assessed with clinical judgement   ADL Overall ADL's : Needs assistance/impaired Eating/Feeding: Independent   Grooming: Wash/dry hands;Sitting;Set up   Upper Body Bathing: Minimal assistance;Sitting   Lower Body Bathing: Min guard;Sit to/from stand;Sitting/lateral leans   Upper Body Dressing : Minimal assistance;Sitting   Lower Body Dressing: Min guard;Sitting/lateral leans;Sit to/from stand Lower Body Dressing Details (indicate cue type and reason): supervision in sitting to doff/don socks Toilet Transfer: Min guard;Ambulation;BSC;RW;Cueing for safety   Toileting- Clothing Manipulation and Hygiene: Min guard;Sitting/lateral lean;Sit to/from stand       Functional mobility during ADLs: Min guard;Rolling walker;Cueing for safety;Cueing for sequencing General ADL Comments: patient having increased difficulty with UB ADLs due to swelling and limited R UE ROM                  Pertinent Vitals/Pain Pain Assessment: 0-10 Pain Score: 7  Faces Pain Scale: Hurts a little bit Pain Location: RUE Pain Descriptors / Indicators: Grimacing Pain Intervention(s): Monitored during session;Ice applied;Repositioned     Hand Dominance Right   Extremity/Trunk Assessment Upper Extremity Assessment Upper Extremity Assessment: RUE deficits/detail RUE Deficits / Details: AAROM from L UE to flex shoulder ~100 degrees, to flex/extend elbow within functional range RUE: Unable to fully assess  due to pain RUE Sensation: (hypersensitivity to light touch) RUE Coordination: decreased gross motor;decreased fine motor   Lower Extremity Assessment Lower Extremity Assessment: Defer to PT evaluation        Communication Communication Communication: No difficulties   Cognition Arousal/Alertness: Awake/alert Behavior During Therapy: WFL for tasks assessed/performed Overall Cognitive Status: Within Functional Limits for tasks assessed                                     General Comments  edema and hypersensitivity R elbow, arm. educate patient in edema management strategies including A/AROM exercises, elevation and ice. supplied patient with ice pack at end of session.            Home Living Family/patient expects to be discharged to:: Private residence Living Arrangements: Alone Available Help at Discharge: Family;Neighbor;Available PRN/intermittently Type of Home: House Home Access: Stairs to enter;Ramped entrance Entrance Stairs-Number of Steps: 3 Entrance Stairs-Rails: Right Home Layout: One level     Bathroom Shower/Tub: Teacher, early years/pre: Handicapped height Bathroom Accessibility: Yes How Accessible: Accessible via walker Home Equipment: [place] - single point;Walker - 2 wheels   Additional Comments: Reports son and grandchildren assist with cooking, cleaning, grocery shopping. patient also has neighbor for 40 yrs that comes over in day time to be with her.      Prior Functioning/Environment Level of Independence: Independent        Comments: Drives; Son and grandsons help with longer driving (to [employer] annually)        OT Problem List: Decreased strength;Decreased range of motion;Decreased activity tolerance;Impaired balance (sitting and/or standing);Decreased coordination;Decreased safety awareness;Pain;Increased edema      OT Treatment/Interventions: Self-care/ADL training;Therapeutic exercise;DME and/or AE instruction;Therapeutic activities;Patient/family education;Balance training    OT Goals(Current goals can be found in the care plan section) Acute Rehab OT Goals Patient Stated Goal: decrease swelling and pain in arm OT  Goal Formulation: With patient Time For Goal Achievement: 01/06/20 Potential to Achieve Goals: Good ADL Goals Pt Will Perform Grooming: with modified independence;sitting;standing Pt Will Perform Upper Body Dressing: with modified independence;sitting;standing Pt Will Perform Lower Body Dressing: with modified independence;sitting/lateral leans;sit to/from stand Pt Will Transfer to Toilet: with modified independence;ambulating(comfort height toilet, walker) Pt Will Perform Toileting - Clothing Manipulation and hygiene: with modified independence;sit to/from stand;sitting/lateral leans Pt/caregiver will Perform Home Exercise Program: Increased ROM;Increased strength;Right Upper extremity;Independently;With written HEP provided  OT Frequency: Min 2X/week    AM-PAC OT "6 Clicks" Daily Activity     Outcome Measure Help from another person eating meals?: None Help from another person taking care of personal grooming?: A Little Help from another person toileting, which includes using toliet, bedpan, or urinal?: A Little Help from another person bathing (including washing, rinsing, drying)?: A Little Help from another person to put on and taking off regular upper body clothing?: A Little Help from another person to put on and taking off regular lower body clothing?: A Little 6 Click Score: 19   End of Session Equipment Utilized During Treatment: Rolling walker Nurse Communication: Mobility status  Activity Tolerance: Patient tolerated treatment well Patient left: in chair;with call bell/phone within reach  OT Visit Diagnosis: Unsteadiness on feet (R26.81);Muscle weakness (generalized) (M62.81);Pain Pain - Right/Left: Right Pain - part of body: Arm                Time: [id number] OT Time Calculation (min): 23 min Charges:  OT General Charges $OT Visit: 1 Visit OT Evaluation $OT Eval Moderate Complexity: 1 Mod OT Treatments $Self Care/Home Management : 8-22 mins  Shon Millet OT OT office:  [phone]  Rosemary Holms 12/23/2019, 11:59 AM

## 2019-12-23 NOTE — Evaluation (Signed)
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  Patient had a colonoscopy 2 days prior to admission, during the procedure patient had a temporary peripheral IV on the right  and later removed after the procedure was done.  She went home and then the next day she developed pain and swelling in the right arm elbow area.  She also was having fever up to 103.  Clinical Impression   Pt admitted with above diagnosis. Comes from home where she was managing independently; Occasionally using a cane for ambulation; Reports some difficulty managing heavy portable O2 tank; Present to PT still with pain RUE, but much improved from admission, Cardiopulmonary impairments (baseline supplemental O2 2 L);  Pt currently with functional limitations due to the deficits listed below (see PT Problem List). Pt will benefit from skilled PT to increase their independence and safety with mobility to allow discharge to the venue listed below.       Follow Up Recommendations HHPT as well as HHOT; Supervision - Intermittent;Other (comment)(Once recovered from this bout of cellulitis, recommend Cardiac Rehab Phase 2)    Equipment Recommendations  (Rollator RW)    Recommendations for Other Services       Precautions / Restrictions Precautions Precautions: Fall Precaution Comments: Keep RUE elevated Restrictions Weight Bearing Restrictions: No      Mobility  Bed Mobility               General bed mobility comments: up to chair upon arrival  Transfers Overall transfer level: Needs assistance Equipment used: Rolling walker (2 wheeled) Transfers: Sit to/from Stand Sit to Stand: Min guard         General transfer comment: min guard  for safety as patient is mildly unsteady  Ambulation/Gait Ambulation/Gait assistance: Min guard(without physical contact) Gait Distance (Feet): 250 Feet Assistive device: Rolling walker (2 wheeled) Gait Pattern/deviations: Step-through pattern;Decreased step length - right;Decreased step length - left;Decreased stride length Gait velocity: approaching WNL   General Gait Details: Overall very nice activity tolerance with hallway ambulation; walked on 2 L supplemental O2, which is her baseline  Science writer    Modified Rankin (Stroke Patients Only)       Balance Overall balance assessment: Needs assistance Sitting-balance support: Feet supported;No upper extremity supported Sitting balance-Leahy Scale: Good     Standing balance support: Bilateral upper extremity supported;During functional activity Standing balance-Leahy Scale: Poor(appraoching Fair) Standing balance comment: reliant on B UE support                             Pertinent Vitals/Pain Pain Assessment: 0-10 Pain Score: 7  Faces Pain Scale: Hurts a little bit Pain Location: RUE Pain Descriptors / Indicators: Grimacing Pain Intervention(s): Monitored during session;Ice applied;Repositioned    Home Living Family/patient expects to be discharged to:: Private residence Living Arrangements: Alone Available Help at Discharge: Family;Neighbor;Available PRN/intermittently Type of Home: House Home Access: Stairs to enter;Ramped entrance Entrance Stairs-Rails: Right Entrance Stairs-Number of Steps: 3 Home Layout: One level Home Equipment: Cane - single point;Walker - 2 wheels Additional Comments: Reports son and grandchildren assist with cooking, cleaning, grocery shopping. patient also has neighbor for 40 yrs that comes  over in day time to be with her.    Prior Function Level of Independence: Independent         Comments: Drives; Son and grandsons help with longer  driving (to [place] annually)     Hand Dominance   Dominant Hand: Right    Extremity/Trunk Assessment   Upper Extremity Assessment Upper Extremity Assessment: RUE deficits/detail RUE Deficits / Details: AAROM from L UE to flex shoulder ~100 degrees, to flex/extend elbow within functional range RUE: Unable to fully assess due to pain RUE Sensation: (hypersensitivity to light touch) RUE Coordination: decreased gross motor;decreased fine motor    Lower Extremity Assessment Lower Extremity Assessment: Overall WFL for tasks assessed(for simple mobility)       Communication   Communication: No difficulties  Cognition Arousal/Alertness: Awake/alert Behavior During Therapy: WFL for tasks assessed/performed Overall Cognitive Status: Within Functional Limits for tasks assessed                                        General Comments General comments (skin integrity, edema, etc.): edema and hypersensitivity R elbow, arm. educate patient in edema management strategies including A/AROM exercises, elevation and ice. supplied patient with ice pack at end of session.    Exercises     Assessment/Plan    PT Assessment Patient needs continued PT services  PT Problem List Decreased balance;Decreased knowledge of use of DME;Decreased knowledge of precautions;Cardiopulmonary status limiting activity       PT Treatment Interventions DME instruction;Gait training;Functional mobility training;Therapeutic activities;Therapeutic exercise;Balance training;Patient/family education    PT Goals (Current goals can be found in the Care Plan section)  Acute Rehab PT Goals Patient Stated Goal: decrease swelling and pain in arm PT Goal Formulation: With patient Time For Goal Achievement: 01/06/20 Potential to Achieve Goals: Good    Frequency Min 3X/week   Barriers to discharge        Co-evaluation               AM-PAC PT "6 Clicks" Mobility  Outcome Measure Help needed  turning from your back to your side while in a flat bed without using bedrails?: A Little Help needed moving from lying on your back to sitting on the side of a flat bed without using bedrails?: A Little Help needed moving to and from a bed to a chair (including a wheelchair)?: A Little Help needed standing up from a chair using your arms (e.g., wheelchair or bedside chair)?: None Help needed to walk in hospital room?: None Help needed climbing 3-5 steps with a railing? : A Little 6 Click Score: 20    End of Session Equipment Utilized During Treatment: Gait belt Activity Tolerance: Patient tolerated treatment well Patient left: in chair;with call bell/phone within reach Nurse Communication: Mobility status PT Visit Diagnosis: Unsteadiness on feet (R26.81)    Time: [id number] PT Time Calculation (min) (ACUTE ONLY): 36 min   Charges:   PT Evaluation $PT Eval Low Complexity: 1 Low PT Treatments $Gait Training: 8-22 mins        Roney Marion, PT  Acute Rehabilitation Services Pager [phone] Office [phone]   Colletta [place] 12/23/2019, 1:18 PM

## 2019-12-24 DIAGNOSIS — L03114 Cellulitis of left upper limb: Secondary | ICD-10-CM

## 2019-12-24 LAB — CMV DNA, QUANTITATIVE, PCR
CMV DNA Quant: POSITIVE IU/mL
Log10 CMV Qn DNA Pl: UNDETERMINED log10 IU/mL

## 2019-12-24 MED ORDER — SODIUM CHLORIDE 0.9% FLUSH
10.0000 mL | INTRAVENOUS | Status: DC | PRN
  Administered 2019-12-25: 10 mL

## 2019-12-24 MED ORDER — SODIUM CHLORIDE 0.9% FLUSH
10.0000 mL | Freq: Two times a day (BID) | INTRAVENOUS | Status: DC
  Administered 2019-12-24 – 2019-12-27 (×5): 10 mL

## 2019-12-24 NOTE — Progress Notes (Signed)
PROGRESS NOTE    [name]  [id number] DOB: 1941/09/18 DOA: 12/18/2019 PCP: No primary care provider on file.   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  Patient had a colonoscopy 2 days prior to admission, during the procedure patient had a temporary peripheral IV on the right breast and later removed after the procedure was done.  She went home and then the next day she developed pain and swelling in the right arm elbow area.  She also was having fever up to 103. Evaluation in the ED show a white blood cell at 19, CRP 12, lactic acid 1.8, BUN 13, creatinine 1.1.  Influenza panel negative respiratory panel negative.  Blood cultures pending.  Elbow x-ray; Findings consistent with olecranon bursitis and adjacent cellulitis. The bones are normal.  Patient admitted with right arm cellulitis, patient on immunosuppressive medication due to her history of renal transplant.  She has a slowly improving on IV antibiotics.  She will likely benefit from few more days of IV antibiotics, her right arm is still very red.  Assessment & Plan:   Active Problems:   Cellulitis  1-Olecranon bursitis, right arm cellulitis; MRI was negative for septic arthritis and or abscess. Continue with  Ceftriaxone. Changed vancomycin to Doxy to avoid AKI. Follow blood cultures: no growth to date.  Orthopedic following. Continue with pain management. Elevation  of the right arm. WBC normalized.  Line place by IR on 12/22/2019. We will continue with IV antibiotics for 2-3 more days, due to immunosuppressive status, persistent redness, pain  and edema of the right arm which is slightly better today per patient.  2-s/p renal transplant: -Continue with low-dose prednisone and Prograf. -Patient relate that Imuran was to stop by her transplant doctor. -Supposed to get lab  work 1-31.  We called transplant center at [facility].  Patient is supposed to get CMV DNA quantitative and BK quantitative. Results pending.  -Creatinine remains stable  3-Hypertension: Systolic blood pressure dropped.  Continue to hold Norvasc. Hypotension; resolved.   4-Asthma: Continue with as needed inhaler.  5-History of coronary artery disease; asymptomatic currently.  Estimated body mass index is 29.23 kg/m as calculated from the following:   Height as of this encounter: 5\' 3"  (1.6 m).   Weight as of this encounter: 74.8 kg.  DVT prophylaxis: Heparin Code Status: Full code Family Communication: Care discussed with patient Disposition Plan:  Patient is from home. Suspect patient will be able to be discharged home Patient need to remain in the hospital for IV antibiotics, due to persistent Right arm swelling, redness, and immunosuppressive status.  Consultants:   Orthopedic  Procedures:   None  Subjective: Patient reports no more fevers, says redness and swelling in arm is about the same as yesterday slightly better.  Objective: Vitals:   12/23/19 1645 12/23/19 2143 12/24/19 0451 12/24/19 0817  BP: (!) 102/44 96/63 (!) 108/36   Pulse: 79 84 81 70  Resp: 14 16 16 16   Temp: 98.8 F (37.1 C) 98.4 F (36.9 C) 99.6 F (37.6 C)   TempSrc: Oral Oral Oral   SpO2: 100% 98% 100% 98%  Weight:      Height:        Intake/Output Summary (Last 24 hours) at 12/24/2019 1034 Last data filed at 12/23/2019 1330 Gross per 24 hour  Intake 300 ml  Output --  Net 300 ml   Danley Danker  Weights   12/18/19 1235  Weight: 74.8 kg    Examination:  General exam: NAD Respiratory system: CTA Cardiovascular system:  S 1, S 2 RRR Gastrointestinal system: BS present, soft, nt Central nervous system: Non focal.   Extremities: Symmetric power.  Skin: Right arm with persistent redness and pain, less edema per patient  Data Reviewed: I have personally reviewed following labs and imaging  studies  CBC: Recent Labs  Lab 12/18/19 1221 12/19/19 0203 12/20/19 0308 12/21/19 0427 12/23/19 0455  WBC 19.2* 18.8* 13.9* 9.9 9.4  NEUTROABS 16.0*  --   --   --   --   HGB 14.0 14.0 11.4* 10.9* 11.4*  HCT 40.8 39.8 33.7* 31.7* 33.0*  MCV 77.9* 77.1* 78.9* 77.5* 77.6*  PLT 129* 116* 103* 124* [id number]*   Basic Metabolic Panel: Recent Labs  Lab 12/18/19 1221 12/19/19 0203 12/20/19 0308 12/21/19 0427 12/23/19 0455  NA 139 136 133* 137 139  K 3.8 3.6 3.9 3.8 3.5  CL 102 102 102 105 102  CO2 27 24 25 23 27   GLUCOSE 172* 140* 109* 103* 124*  BUN 13 11 11 11 8   CREATININE 1.18* 1.09* 1.20* 1.09* 1.11*  CALCIUM 9.2 8.8* 7.8* 8.2* 9.1   GFR: Estimated Creatinine Clearance: 41.8 mL/min (A) (by C-G formula based on SCr of 1.11 mg/dL (H)). Liver Function Tests: Recent Labs  Lab 12/18/19 1221  AST 19  ALT 12  ALKPHOS 46  BILITOT 1.2  PROT 6.6  ALBUMIN 3.9   No results for input(s): LIPASE, AMYLASE in the last 168 hours. No results for input(s): AMMONIA in the last 168 hours. Coagulation Profile: No results for input(s): INR, PROTIME in the last 168 hours. Cardiac Enzymes: No results for input(s): CKTOTAL, CKMB, CKMBINDEX, TROPONINI in the last 168 hours. BNP (last 3 results) No results for input(s): PROBNP in the last 8760 hours. HbA1C: No results for input(s): HGBA1C in the last 72 hours. CBG: No results for input(s): GLUCAP in the last 168 hours. Lipid Profile: No results for input(s): CHOL, HDL, LDLCALC, TRIG, CHOLHDL, LDLDIRECT in the last 72 hours. Thyroid Function Tests: No results for input(s): TSH, T4TOTAL, FREET4, T3FREE, THYROIDAB in the last 72 hours. Anemia Panel: No results for input(s): VITAMINB12, FOLATE, FERRITIN, TIBC, IRON, RETICCTPCT in the last 72 hours. Sepsis Labs: Recent Labs  Lab 12/18/19 1221 12/18/19 1902  LATICACIDVEN 1.8 1.9    Recent Results (from the past 240 hour(s))  Culture, blood (routine x 2)     Status: None   Collection  Time: 12/18/19 12:23 PM   Specimen: BLOOD  Result Value Ref Range Status   Specimen Description BLOOD SITE NOT SPECIFIED  Final   Special Requests   Final    BOTTLES DRAWN AEROBIC AND ANAEROBIC Blood Culture adequate volume Performed at [facility], [place] [address] [place], [place] [address]    Culture NO GROWTH 5 DAYS  Final   Report Status 12/23/2019 FINAL  Final  Respiratory Panel by RT PCR (Flu A&B, Covid) - Nasopharyngeal Swab     Status: None   Collection Time: 12/18/19 12:24 PM   Specimen: Nasopharyngeal Swab  Result Value Ref Range Status   SARS Coronavirus 2 by RT PCR NEGATIVE NEGATIVE Final    Comment: (NOTE) SARS-CoV-2 target nucleic acids are NOT DETECTED. The SARS-CoV-2 RNA is generally detectable in upper respiratoy specimens during the acute phase of infection. The lowest concentration of SARS-CoV-2 viral copies this assay can detect is 131 copies/mL. A negative  result does not preclude SARS-Cov-2 infection and should not be used as the sole basis for treatment or other patient management decisions. A negative result may occur with  improper specimen collection/handling, submission of specimen other than nasopharyngeal swab, presence of viral mutation(s) within the areas targeted by this assay, and inadequate number of viral copies (<131 copies/mL). A negative result must be combined with clinical observations, patient history, and epidemiological information. The expected result is Negative. Fact Sheet for Patients:  PinkCheek.be Fact Sheet for Healthcare Providers:  GravelBags.it This test is not yet ap proved or cleared by the [place] FDA and  has been authorized for detection and/or diagnosis of SARS-CoV-2 by FDA under an Emergency Use Authorization (EUA). This EUA will remain  in effect (meaning this test can be used) for the duration of the COVID-19 declaration under Section 564(b)(1) of  the Act, 21 U.S.C. section 360bbb-3(b)(1), unless the authorization is terminated or revoked sooner.    Influenza A by PCR NEGATIVE NEGATIVE Final   Influenza B by PCR NEGATIVE NEGATIVE Final    Comment: (NOTE) The Xpert Xpress SARS-CoV-2/FLU/RSV assay is intended as an aid in  the diagnosis of influenza from Nasopharyngeal swab specimens and  should not be used as a sole basis for treatment. Nasal washings and  aspirates are unacceptable for Xpert Xpress SARS-CoV-2/FLU/RSV  testing. Fact Sheet for Patients: PinkCheek.be Fact Sheet for Healthcare Providers: GravelBags.it This test is not yet approved or cleared by the [place] FDA and  has been authorized for detection and/or diagnosis of SARS-CoV-2 by  FDA under an Emergency Use Authorization (EUA). This EUA will remain  in effect (meaning this test can be used) for the duration of the  Covid-19 declaration under Section 564(b)(1) of the Act, 21  U.S.C. section 360bbb-3(b)(1), unless the authorization is  terminated or revoked. Performed at [facility], [place] [address] [place], [place] [address]   MRSA PCR Screening     Status: None   Collection Time: 12/18/19  6:00 PM   Specimen: Nasopharyngeal  Result Value Ref Range Status   MRSA by PCR NEGATIVE NEGATIVE Final    Comment:        The GeneXpert MRSA Assay (FDA approved for NASAL specimens only), is one component of a comprehensive MRSA colonization surveillance program. It is not intended to diagnose MRSA infection nor to guide or monitor treatment for MRSA infections. Performed at [facility], [place] [address], [place], [place] [address]   Culture, blood (routine x 2)     Status: None   Collection Time: 12/18/19  7:02 PM   Specimen: BLOOD RIGHT HAND  Result Value Ref Range Status   Specimen Description BLOOD RIGHT HAND  Final   Special Requests   Final    BOTTLES DRAWN AEROBIC ONLY Blood  Culture results may not be optimal due to an inadequate volume of blood received in culture bottles Performed at [facility], [place] [address], [place], [place] [address]    Culture NO GROWTH 5 DAYS  Final   Report Status 12/23/2019 FINAL  Final         Radiology Studies: IR Fluoro Guide CV Line Right  Result Date: 12/22/2019 INDICATION: 79 year old female with poor venous access in need of IV access. EXAM: IR ULTRASOUND GUIDANCE VASC ACCESS RIGHT; IR RIGHT FLUORO GUIDE CV LINE MEDICATIONS: None ANESTHESIA/SEDATION: None FLUOROSCOPY TIME:  Fluoroscopy Time: 0 minutes 6 seconds (1 mGy). COMPLICATIONS: None immediate. PROCEDURE: Informed written consent was  obtained from the patient after a thorough discussion of the procedural risks, benefits and alternatives. All questions were addressed. Maximal Sterile Barrier Technique was utilized including caps, mask, sterile gowns, sterile gloves, sterile drape, hand hygiene and skin antiseptic. A timeout was performed prior to the initiation of the procedure. The right internal jugular vein was interrogated with ultrasound and found to be widely patent. An image was obtained and stored for the medical record. Local anesthesia was attained by infiltration with 1% lidocaine. A small dermatotomy was made. Under real-time sonographic guidance, the vessel was punctured with a 21 gauge micropuncture needle. Using standard technique, the initial micro needle was exchanged over a 0.018 micro wire for a peel-away sheath. The 0.018 wire was then used to measure the intravascular length to the superior cavoatrial junction. A dual lumen power injectable central venous catheter was then cut to 15 cm and advanced through the peel-away sheath. The peel-away sheath was discarded. A fluoroscopic spot image was obtained documenting the catheter tips at the cavoatrial junction. The catheter was flushed and locked with valved caps. The catheter was secured to the skin with 0  Prolene suture. Sterile bandages were applied. IMPRESSION: Successful placement of a right IJ approach dual lumen power injectable central line. The line is 15 cm in length and the catheter tips are at the superior cavoatrial junction. The catheter is ready for immediate use. Electronically Signed   By: Jacqulynn Cadet M.D.   On: 12/22/2019 16:39   IR US Guide Vasc Access Right  Result Date: 12/22/2019 INDICATION: 79 year old female with poor venous access in need of IV access. EXAM: IR ULTRASOUND GUIDANCE VASC ACCESS RIGHT; IR RIGHT FLUORO GUIDE CV LINE MEDICATIONS: None ANESTHESIA/SEDATION: None FLUOROSCOPY TIME:  Fluoroscopy Time: 0 minutes 6 seconds (1 mGy). COMPLICATIONS: None immediate. PROCEDURE: Informed written consent was obtained from the patient after a thorough discussion of the procedural risks, benefits and alternatives. All questions were addressed. Maximal Sterile Barrier Technique was utilized including caps, mask, sterile gowns, sterile gloves, sterile drape, hand hygiene and skin antiseptic. A timeout was performed prior to the initiation of the procedure. The right internal jugular vein was interrogated with ultrasound and found to be widely patent. An image was obtained and stored for the medical record. Local anesthesia was attained by infiltration with 1% lidocaine. A small dermatotomy was made. Under real-time sonographic guidance, the vessel was punctured with a 21 gauge micropuncture needle. Using standard technique, the initial micro needle was exchanged over a 0.018 micro wire for a peel-away sheath. The 0.018 wire was then used to measure the intravascular length to the superior cavoatrial junction. A dual lumen power injectable central venous catheter was then cut to 15 cm and advanced through the peel-away sheath. The peel-away sheath was discarded. A fluoroscopic spot image was obtained documenting the catheter tips at the cavoatrial junction. The catheter was flushed and locked  with valved caps. The catheter was secured to the skin with 0 Prolene suture. Sterile bandages were applied. IMPRESSION: Successful placement of a right IJ approach dual lumen power injectable central line. The line is 15 cm in length and the catheter tips are at the superior cavoatrial junction. The catheter is ready for immediate use. Electronically Signed   By: Jacqulynn Cadet M.D.   On: 12/22/2019 16:39   Scheduled Meds: . calcitRIOL  0.25 mcg Oral Daily  . Chlorhexidine Gluconate Cloth  6 each Topical Daily  . doxycycline  100 mg Oral Q12H  . fluticasone furoate-vilanterol  1 puff Inhalation Daily  . pantoprazole  40 mg Oral Daily  . polyethylene glycol  17 g Oral Daily  . predniSONE  5 mg Oral Daily  . rosuvastatin  10 mg Oral QPM  . tacrolimus  5 mg Oral BID  . umeclidinium bromide  1 puff Inhalation Daily   Continuous Infusions: . cefTRIAXone (ROCEPHIN)  IV 1 g (12/23/19 1342)     LOS: 6 days   Time spent: 35 minutes   Simrin Vegh Marry Guan, MD [facility]  If 7PM-7AM, please contact night-coverage  Password [place] 12/24/2019, 10:34 AM

## 2019-12-25 NOTE — Progress Notes (Signed)
PROGRESS NOTE    [name]  [id number] DOB: November 21, 1940 DOA: 12/18/2019 PCP: No primary care provider on file.   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  Patient had a colonoscopy 2 days prior to admission, during the procedure patient had a temporary peripheral IV on the right breast and later removed after the procedure was done.  She went home and then the next day she developed pain and swelling in the right arm elbow area.  She also was having fever up to 103. Evaluation in the ED show a white blood cell at 19, CRP 12, lactic acid 1.8, BUN 13, creatinine 1.1.  Influenza panel negative respiratory panel negative.  Blood cultures pending.  Elbow x-ray; Findings consistent with olecranon bursitis and adjacent cellulitis. The bones are normal.  Patient admitted with right arm cellulitis, patient on immunosuppressive medication due to her history of renal transplant.  She has a slowly improving on IV antibiotics.  She will likely benefit from 1-2 more days of IV antibiotics, her right arm is still very red.  Assessment & Plan:  Active Problems:   Cellulitis  1-Olecranon bursitis, right arm cellulitis; MRI was negative for septic arthritis and or abscess. Continue with  Ceftriaxone. Changed vancomycin to Doxy to avoid AKI. Follow blood cultures: no growth to date.  Orthopedic following. Continue with pain management. Elevation  of the right arm. WBC normalized.  Line place by IR on 12/22/2019. We will continue with IV antibiotics for 2-3 more days, due to immunosuppressive status, persistent redness, pain  and edema of the right arm which is slightly better today per patient.  2-s/p renal transplant: -Continue with low-dose prednisone and Prograf. -Patient relate that Imuran was to stop by her transplant doctor. -Supposed to get lab  work 1-31.  We called transplant center at [facility].  Patient is supposed to get CMV DNA quantitative and BK quantitative.  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  -Creatinine remains stable, will need to FU with transplant center at [facility]  3-Hypertension: Systolic blood pressure dropped.  Continue to hold Norvasc. Hypotension; resolved.   4-Asthma: Continue with as needed inhaler.  5-History of coronary artery disease; asymptomatic currently.  Estimated body mass index is 29.23 kg/m as calculated from the following:   Height as of this encounter: 5\' 3"  (1.6 m).   Weight as of this encounter: 74.8 kg.  DVT prophylaxis: Heparin Code Status: Full code Family Communication: Care discussed with patient Disposition Plan:  Patient is from home. Will be able to be discharged home Patient need to remain in the hospital for IV antibiotics, due to persistent Right arm swelling, redness, and immunosuppressive status.  Consultants:   Orthopedic  Procedures:   None  Subjective: Patient reports no more fevers, says redness and swelling in arm is better today.  Objective: Vitals:   12/24/19 1404 12/24/19 2022 12/25/19 0510 12/25/19 0746  BP: 128/90 (!) 102/58 (!) 118/93   Pulse: 89 76 78   Resp: 20 18 16    Temp: 98.3 F (36.8 C) 98.5 F (36.9 C) 98.6 F (37 C)   TempSrc: Oral Oral Oral   SpO2: 100% 98% 100% 100%  Weight:      Height:        Intake/Output Summary (Last 24 hours) at 12/25/2019 1029 Last data filed at 12/25/2019 0804 Gross per 24 hour  Intake 350 ml  Output --  Net 350 ml   Filed Weights   12/18/19 1235  Weight: 74.8 kg    Examination:  General exam: NAD Respiratory system: CTA Cardiovascular system:  S 1, S 2 RRR Gastrointestinal system: BS present, soft, nt Central nervous system: Non focal.   Extremities: Symmetric power.  Skin: Right arm with persistent redness and pain, though the redness is receding today  Data Reviewed: I have personally reviewed  following labs and imaging studies  CBC: Recent Labs  Lab 12/18/19 1221 12/19/19 0203 12/20/19 0308 12/21/19 0427 12/23/19 0455  WBC 19.2* 18.8* 13.9* 9.9 9.4  NEUTROABS 16.0*  --   --   --   --   HGB 14.0 14.0 11.4* 10.9* 11.4*  HCT 40.8 39.8 33.7* 31.7* 33.0*  MCV 77.9* 77.1* 78.9* 77.5* 77.6*  PLT 129* 116* 103* 124* [id number]*   Basic Metabolic Panel: Recent Labs  Lab 12/18/19 1221 12/19/19 0203 12/20/19 0308 12/21/19 0427 12/23/19 0455  NA 139 136 133* 137 139  K 3.8 3.6 3.9 3.8 3.5  CL 102 102 102 105 102  CO2 27 24 25 23 27   GLUCOSE 172* 140* 109* 103* 124*  BUN 13 11 11 11 8   CREATININE 1.18* 1.09* 1.20* 1.09* 1.11*  CALCIUM 9.2 8.8* 7.8* 8.2* 9.1   GFR: Estimated Creatinine Clearance: 41.8 mL/min (A) (by C-G formula based on SCr of 1.11 mg/dL (H)). Liver Function Tests: Recent Labs  Lab 12/18/19 1221  AST 19  ALT 12  ALKPHOS 46  BILITOT 1.2  PROT 6.6  ALBUMIN 3.9   No results for input(s): LIPASE, AMYLASE in the last 168 hours. No results for input(s): AMMONIA in the last 168 hours. Coagulation Profile: No results for input(s): INR, PROTIME in the last 168 hours. Cardiac Enzymes: No results for input(s): CKTOTAL, CKMB, CKMBINDEX, TROPONINI in the last 168 hours. BNP (last 3 results) No results for input(s): PROBNP in the last 8760 hours. HbA1C: No results for input(s): HGBA1C in the last 72 hours. CBG: No results for input(s): GLUCAP in the last 168 hours. Lipid Profile: No results for input(s): CHOL, HDL, LDLCALC, TRIG, CHOLHDL, LDLDIRECT in the last 72 hours. Thyroid Function Tests: No results for input(s): TSH, T4TOTAL, FREET4, T3FREE, THYROIDAB in the last 72 hours. Anemia Panel: No results for input(s): VITAMINB12, FOLATE, FERRITIN, TIBC, IRON, RETICCTPCT in the last 72 hours. Sepsis Labs: Recent Labs  Lab 12/18/19 1221 12/18/19 1902  LATICACIDVEN 1.8 1.9    Recent Results (from the past 240 hour(s))  Culture, blood (routine x 2)      Status: None   Collection Time: 12/18/19 12:23 PM   Specimen: BLOOD  Result Value Ref Range Status   Specimen Description BLOOD SITE NOT SPECIFIED  Final   Special Requests   Final    BOTTLES DRAWN AEROBIC AND ANAEROBIC Blood Culture adequate volume Performed at [facility], [place] [address], [place], [place] [address]    Culture NO GROWTH 5 DAYS  Final   Report Status 12/23/2019 FINAL  Final  Respiratory Panel by RT PCR (Flu A&B, Covid) - Nasopharyngeal Swab     Status: None   Collection Time: 12/18/19 12:24 PM   Specimen: Nasopharyngeal Swab  Result Value Ref Range Status   SARS Coronavirus 2 by RT PCR NEGATIVE NEGATIVE Final    Comment: (NOTE) SARS-CoV-2 target nucleic acids are NOT DETECTED. The SARS-CoV-2 RNA is generally detectable in upper respiratoy specimens during the acute phase of infection. The lowest concentration of SARS-CoV-2  viral copies this assay can detect is 131 copies/mL. A negative result does not preclude SARS-Cov-2 infection and should not be used as the sole basis for treatment or other patient management decisions. A negative result may occur with  improper specimen collection/handling, submission of specimen other than nasopharyngeal swab, presence of viral mutation(s) within the areas targeted by this assay, and inadequate number of viral copies (<131 copies/mL). A negative result must be combined with clinical observations, patient history, and epidemiological information. The expected result is Negative. Fact Sheet for Patients:  PinkCheek.be Fact Sheet for Healthcare Providers:  GravelBags.it This test is not yet ap proved or cleared by the [place] FDA and  has been authorized for detection and/or diagnosis of SARS-CoV-2 by FDA under an Emergency Use Authorization (EUA). This EUA will remain  in effect (meaning this test can be used) for the duration of the COVID-19 declaration  under Section 564(b)(1) of the Act, 21 U.S.C. section 360bbb-3(b)(1), unless the authorization is terminated or revoked sooner.    Influenza A by PCR NEGATIVE NEGATIVE Final   Influenza B by PCR NEGATIVE NEGATIVE Final    Comment: (NOTE) The Xpert Xpress SARS-CoV-2/FLU/RSV assay is intended as an aid in  the diagnosis of influenza from Nasopharyngeal swab specimens and  should not be used as a sole basis for treatment. Nasal washings and  aspirates are unacceptable for Xpert Xpress SARS-CoV-2/FLU/RSV  testing. Fact Sheet for Patients: PinkCheek.be Fact Sheet for Healthcare Providers: GravelBags.it This test is not yet approved or cleared by the [place] FDA and  has been authorized for detection and/or diagnosis of SARS-CoV-2 by  FDA under an Emergency Use Authorization (EUA). This EUA will remain  in effect (meaning this test can be used) for the duration of the  Covid-19 declaration under Section 564(b)(1) of the Act, 21  U.S.C. section 360bbb-3(b)(1), unless the authorization is  terminated or revoked. Performed at [facility], [place] [address], [place], [place] [address]   MRSA PCR Screening     Status: None   Collection Time: 12/18/19  6:00 PM   Specimen: Nasopharyngeal  Result Value Ref Range Status   MRSA by PCR NEGATIVE NEGATIVE Final    Comment:        The GeneXpert MRSA Assay (FDA approved for NASAL specimens only), is one component of a comprehensive MRSA colonization surveillance program. It is not intended to diagnose MRSA infection nor to guide or monitor treatment for MRSA infections. Performed at [facility], [place] [address] [place], [place] [address]   Culture, blood (routine x 2)     Status: None   Collection Time: 12/18/19  7:02 PM   Specimen: BLOOD RIGHT HAND  Result Value Ref Range Status   Specimen Description BLOOD RIGHT HAND  Final   Special Requests   Final    BOTTLES  DRAWN AEROBIC ONLY Blood Culture results may not be optimal due to an inadequate volume of blood received in culture bottles Performed at [facility], [place] [address] [place], [place] [address]    Culture NO GROWTH 5 DAYS  Final   Report Status 12/23/2019 FINAL  Final    Radiology Studies: No results found. Scheduled Meds: . calcitRIOL  0.25 mcg Oral Daily  . Chlorhexidine Gluconate Cloth  6 each Topical Daily  . doxycycline  100 mg Oral Q12H  . fluticasone furoate-vilanterol  1 puff Inhalation Daily  . pantoprazole  40 mg Oral Daily  . polyethylene glycol  17 g Oral Daily  . predniSONE  5 mg Oral Daily  . rosuvastatin  10 mg Oral QPM  . sodium chloride flush  10-40 mL Intracatheter Q12H  . tacrolimus  5 mg Oral BID  . umeclidinium bromide  1 puff Inhalation Daily   Continuous Infusions: . cefTRIAXone (ROCEPHIN)  IV 1 g (12/24/19 1356)    LOS: 7 days   Time spent: 25 minutes   Nedda Gains Marry Guan, MD [facility]  If 7PM-7AM, please contact night-coverage  Password [place] 12/25/2019, 10:29 AM

## 2019-12-25 NOTE — Plan of Care (Signed)

## 2019-12-25 NOTE — Progress Notes (Signed)
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  Patient had a colonoscopy 2 days prior to admission, during the procedure patient had a temporary peripheral IV on the right  and later removed after the procedure was done.  She went home and then the next day she developed pain and swelling in the right arm elbow area.  She also was having fever up to 103.   OT comments  Pt making good progress with functional goals. Session focused on UB ADLs, functional mobility using RW to [facility], toilet transfers, toileting and grooming/hygiene while standing. Pt tolerated A/AAROM of R UE and positioned in elevation with pillow at end of session. OT will continue to follow acutely  Follow Up Recommendations  Home health OT;Supervision - Intermittent    Equipment Recommendations  None recommended by OT    Recommendations for Other Services      Precautions / Restrictions Precautions Precautions: Fall Precaution Comments: Keep RUE elevated for edema mgt Restrictions Weight Bearing Restrictions: No       Mobility Bed Mobility               General bed mobility comments: up to chair upon arrival  Transfers Overall transfer level: Needs assistance Equipment used: Rolling walker (2 wheeled) Transfers: Sit to/from Stand Sit to Stand: Min guard              Balance Overall balance assessment: Needs assistance Sitting-balance support: Feet supported;No upper extremity supported Sitting balance-Leahy Scale: Good     Standing balance support: Bilateral upper extremity supported;During functional activity Standing balance-Leahy Scale: Poor                             ADL either performed or assessed  with clinical judgement   ADL Overall ADL's : Needs assistance/impaired Eating/Feeding: Independent   Grooming: Wash/dry hands;Wash/dry face;Min guard;Standing   Upper Body Bathing: Minimal assistance;Sitting Upper Body Bathing Details (indicate cue type and reason): simulated     Upper Body Dressing : Minimal assistance;Sitting       Toilet Transfer: Min guard;Ambulation;BSC;RW;Cueing for safety   Toileting- Clothing Manipulation and Hygiene: Min guard;Sitting/lateral lean;Sit to/from stand       Functional mobility during ADLs: Min guard;Rolling walker;Cueing for safety;Cueing for sequencing General ADL Comments: patient having increased difficulty with UB ADLs due to swelling and limited R UE ROM     Vision Patient Visual Report: No change from baseline     Perception     Praxis      Cognition Arousal/Alertness: Awake/alert Behavior During Therapy: WFL for tasks assessed/performed Overall Cognitive Status: Within Functional Limits for tasks assessed                                          Exercises Other Exercises Other Exercises: Pt tolerated A/AAROM of R UE in multiple planes. pt with R UE elevated with pillow   Shoulder Instructions       General Comments      Pertinent Vitals/ Pain       Pain Assessment: 0-10 Pain Score: 4  Pain Location: RUE Pain Descriptors / Indicators: Grimacing;Guarding Pain  Intervention(s): Monitored during session;Repositioned  Home Living                                          Prior Functioning/Environment              Frequency  Min 2X/week        Progress Toward Goals  OT Goals(current goals can now be found in the care plan section)  Progress towards OT goals: Progressing toward goals  Acute Rehab OT Goals Patient Stated Goal: decrease swelling and pain in arm  Plan      Co-evaluation                 AM-PAC OT "6 Clicks" Daily Activity     Outcome  Measure   Help from another person eating meals?: None Help from another person taking care of personal grooming?: A Little Help from another person toileting, which includes using toliet, bedpan, or urinal?: A Little Help from another person bathing (including washing, rinsing, drying)?: A Little Help from another person to put on and taking off regular upper body clothing?: A Little Help from another person to put on and taking off regular lower body clothing?: A Little 6 Click Score: 19    End of Session Equipment Utilized During Treatment: Rolling walker;Other (comment);Oxygen(BSC)  OT Visit Diagnosis: Unsteadiness on feet (R26.81);Muscle weakness (generalized) (M62.81);Pain Pain - Right/Left: Right Pain - part of body: Arm   Activity Tolerance Patient tolerated treatment well   Patient Left in chair;with call bell/phone within reach   Nurse Communication          Time: [id number] OT Time Calculation (min): 25 min  Charges: OT General Charges $OT Visit: 1 Visit OT Treatments $Self Care/Home Management : 8-22 mins $Therapeutic Activity: 8-22 mins     Britt Bottom 12/25/2019, 2:20 PM

## 2019-12-25 NOTE — Progress Notes (Signed)
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  Patient had a colonoscopy 2 days prior to admission, during the procedure patient had a temporary peripheral IV on the right  and later removed after the procedure was done.  She went home and then the next day she developed pain and swelling in the right arm elbow area.  She also was having fever up to 103.    PT Comments    Pt making steady progress with mobility. Pt on 2L of O2 throughout with SPO2 maintaining >92%. Pt does demonstrate increased dyspnea when fatigued and requires sitting rest break for several minutes to control RR. Pt would continue to benefit from skilled physical therapy services at this time while admitted and after d/c to address the below listed limitations in order to improve overall safety and independence with functional mobility.    Follow Up Recommendations  Supervision - Intermittent;Other (comment)(Outpatient Cardiac Rehab)     Equipment Recommendations  Other (comment)(rollator)    Recommendations for Other Services       Precautions / Restrictions Precautions Precautions: Fall Precaution Comments: Keep RUE elevated for edema mgt Restrictions Weight Bearing Restrictions: No    Mobility  Bed Mobility               General bed mobility comments: up to chair upon arrival  Transfers Overall transfer level: Needs assistance Equipment used: Rolling walker (2 wheeled);None Transfers: Sit to/from Stand Sit to Stand: Supervision         General transfer comment: for safety  Ambulation/Gait Ambulation/Gait assistance: Supervision Gait Distance (Feet): 250 Feet Assistive device: Rolling walker (2 wheeled) Gait  Pattern/deviations: Step-through pattern;Decreased step length - right;Decreased step length - left;Decreased stride length Gait velocity: mildly decreased, more so when fatigued   General Gait Details: pt overall steady with RW, no LOB or need for physical assistance; pt ambulating on 2L of supplemental O2   Stairs             Wheelchair Mobility    Modified Rankin (Stroke Patients Only)       Balance Overall balance assessment: Needs assistance Sitting-balance support: Feet supported;No upper extremity supported Sitting balance-Leahy Scale: Good     Standing balance support: Bilateral upper extremity supported;During functional activity Standing balance-Leahy Scale: Poor                              Cognition Arousal/Alertness: Awake/alert Behavior During Therapy: WFL for tasks assessed/performed Overall Cognitive Status: Within Functional Limits for tasks assessed                                        Exercises Other Exercises Other Exercises: Pt tolerated A/AAROM of R UE in multiple planes. pt with R UE elevated with pillow    General Comments        Pertinent Vitals/Pain Pain Assessment: 0-10 Pain Score: 7  Pain Location: RUE Pain Descriptors / Indicators: Grimacing;Guarding Pain Intervention(s): Monitored during session;Repositioned    Home Living  Prior Function            PT Goals (current goals can now be found in the care plan section) Acute Rehab PT Goals Patient Stated Goal: decrease swelling and pain in arm PT Goal Formulation: With patient Time For Goal Achievement: 01/06/20 Potential to Achieve Goals: Good Progress towards PT goals: Progressing toward goals    Frequency    Min 3X/week      PT Plan Current plan remains appropriate    Co-evaluation              AM-PAC PT "6 Clicks" Mobility   Outcome Measure  Help needed turning from your back to your side  while in a flat bed without using bedrails?: None Help needed moving from lying on your back to sitting on the side of a flat bed without using bedrails?: None Help needed moving to and from a bed to a chair (including a wheelchair)?: None Help needed standing up from a chair using your arms (e.g., wheelchair or bedside chair)?: None Help needed to walk in hospital room?: A Little Help needed climbing 3-5 steps with a railing? : A Lot 6 Click Score: 21    End of Session Equipment Utilized During Treatment: Oxygen Activity Tolerance: Patient tolerated treatment well Patient left: in bed;with call bell/phone within reach Nurse Communication: Mobility status PT Visit Diagnosis: Unsteadiness on feet (R26.81)     Time: [id number] PT Time Calculation (min) (ACUTE ONLY): 19 min  Charges:  $Gait Training: 8-22 mins                     Anastasio Champion, DPT  Acute Rehabilitation Services Pager [phone] Office [place] 12/25/2019, 3:34 PM

## 2019-12-26 DIAGNOSIS — M7021 Olecranon bursitis, right elbow: Secondary | ICD-10-CM

## 2019-12-26 DIAGNOSIS — J4550 Severe persistent asthma, uncomplicated: Secondary | ICD-10-CM

## 2019-12-26 DIAGNOSIS — M109 Gout, unspecified: Secondary | ICD-10-CM

## 2019-12-26 DIAGNOSIS — Z951 Presence of aortocoronary bypass graft: Secondary | ICD-10-CM

## 2019-12-26 DIAGNOSIS — Z940 Kidney transplant status: Secondary | ICD-10-CM

## 2019-12-26 DIAGNOSIS — I2510 Atherosclerotic heart disease of native coronary artery without angina pectoris: Secondary | ICD-10-CM

## 2019-12-26 MED ORDER — ORAL CARE MOUTH RINSE
15.0000 mL | Freq: Two times a day (BID) | OROMUCOSAL | Status: DC
  Administered 2019-12-27: 15 mL via OROMUCOSAL

## 2019-12-26 MED ORDER — NAPROXEN 250 MG PO TABS
500.0000 mg | ORAL_TABLET | Freq: Two times a day (BID) | ORAL | Status: DC
  Administered 2019-12-26 – 2019-12-27 (×2): 500 mg via ORAL
  Filled 2019-12-26 (×2): qty 2

## 2019-12-26 MED ORDER — COLCHICINE 0.6 MG PO TABS
0.6000 mg | ORAL_TABLET | Freq: Two times a day (BID) | ORAL | Status: DC
  Administered 2019-12-26 – 2019-12-27 (×3): 0.6 mg via ORAL
  Filled 2019-12-26 (×3): qty 1

## 2019-12-26 MED ORDER — PANTOPRAZOLE SODIUM 40 MG PO TBEC
40.0000 mg | DELAYED_RELEASE_TABLET | Freq: Two times a day (BID) | ORAL | Status: DC
  Administered 2019-12-26 – 2019-12-27 (×2): 40 mg via ORAL
  Filled 2019-12-26 (×2): qty 1

## 2019-12-26 NOTE — Progress Notes (Signed)
PROGRESS NOTE    [name]  [id number] DOB: 12/06/1940 DOA: 12/18/2019 PCP: No primary care provider on file.   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  Patient had a colonoscopy 2 days prior to admission, during the procedure patient had a temporary peripheral IV on the right breast and later removed after the procedure was done.  She went home and then the next day she developed pain and swelling in the right arm elbow area.  She also was having fever up to 103. Evaluation in the ED show a white blood cell at 19, CRP 12, lactic acid 1.8, BUN 13, creatinine 1.1.  Influenza panel negative respiratory panel negative.  Blood cultures pending.  Elbow x-ray; Findings consistent with olecranon bursitis and adjacent cellulitis. The bones are normal.  Patient admitted with right arm cellulitis, patient on immunosuppressive medication due to her history of renal transplant.  She has a slowly improving on IV antibiotics.  She will likely benefit from 1-2 more days of IV antibiotics, her right arm is still very red.  Assessment & Plan:  Principal Problem:   Right arm cellulitis Active Problems:   GOUT   Persistent asthma without complication   Coronary artery disease   Hx of CABG   S/P kidney transplant   Olecranon bursitis of right elbow  1-Olecranon bursitis, right arm cellulitis; MRI was negative for septic arthritis and or abscess. Continue Rocephin and oxycycline Follow blood cultures: no growth to date.  Orthopedic following. Continue with pain management. Add colchicine and naproxen with meals and with PPI BID WBC normalized.  Line place by IR on 12/22/2019. We will continue with IV antibiotics for 2  more days, due to immunosuppressive status, persistent redness, pain  and edema of the right arm which is slightly better today per  patient.  2-s/p renal transplant: -Continue with low-dose prednisone and Prograf. -Patient relate that Imuran was to stop by her transplant doctor. -Supposed to get lab work 1-31.  We called transplant center at [facility].  Patient is supposed to get CMV DNA quantitative and BK quantitative.  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  -Creatinine remains stable, will need to FU with transplant center at [facility]  3-Hypertension: Systolic blood pressure dropped.  Continue to hold Norvasc. Hypotension; resolved.   4-Asthma: Continue with as needed inhaler.  5-History of coronary artery disease; asymptomatic currently.  Estimated body mass index is 29.23 kg/m as calculated from the following:   Height as of this encounter: 5\' 3"  (1.6 m).   Weight as of this encounter: 74.8 kg.  DVT prophylaxis: Heparin Code Status: Full code Family Communication: Care discussed with patient Disposition Plan:  Patient is from home. Will be able to be discharged home Patient need to remain in the hospital for IV antibiotics, due to persistent Right arm swelling, redness, and immunosuppressive status.  Consultants:   Orthopedic  Procedures:   None  Subjective: Patient reports no  fevers, says pain and swelling are still in right elbow, slightly better but ROM still not quite well.  Objective: Vitals:   12/25/19 0746 12/25/19 1647 12/25/19 2100 12/26/19 0531  BP:  (!) 105/50 (!) 108/59 93/75  Pulse:  85 82 81  Resp:  17 16 17   Temp:  99.1 F (37.3 C) 98.8 F (37.1 C) 98.3 F (36.8 C)  TempSrc:  Oral Oral Oral  SpO2: 100%  100% 100% 99%  Weight:      Height:        Intake/Output Summary (Last 24 hours) at 12/26/2019 1510 Last data filed at 12/26/2019 1032 Gross per 24 hour  Intake 820 ml  Output --  Net 820 ml   Filed Weights   12/18/19 1235  Weight: 74.8 kg    Examination:  General exam: NAD Respiratory system: CTA Cardiovascular system:  S 1, S 2 RRR Gastrointestinal system: BS  present, soft, nt Central nervous system: Non focal.   Extremities: Symmetric power.  Skin: Right arm with persistent redness and tender and swelling, though the redness is receding today  Data Reviewed: I have personally reviewed following labs and imaging studies  CBC: Recent Labs  Lab 12/20/19 0308 12/21/19 0427 12/23/19 0455  WBC 13.9* 9.9 9.4  HGB 11.4* 10.9* 11.4*  HCT 33.7* 31.7* 33.0*  MCV 78.9* 77.5* 77.6*  PLT 103* 124* [id number]*   Basic Metabolic Panel: Recent Labs  Lab 12/20/19 0308 12/21/19 0427 12/23/19 0455  NA 133* 137 139  K 3.9 3.8 3.5  CL 102 105 102  CO2 25 23 27   GLUCOSE 109* 103* 124*  BUN 11 11 8   CREATININE 1.20* 1.09* 1.11*  CALCIUM 7.8* 8.2* 9.1   GFR: Estimated Creatinine Clearance: 41.8 mL/min (A) (by C-G formula based on SCr of 1.11 mg/dL (H)). Liver Function Tests: No results for input(s): AST, ALT, ALKPHOS, BILITOT, PROT, ALBUMIN in the last 168 hours. No results for input(s): LIPASE, AMYLASE in the last 168 hours. No results for input(s): AMMONIA in the last 168 hours. Coagulation Profile: No results for input(s): INR, PROTIME in the last 168 hours. Cardiac Enzymes: No results for input(s): CKTOTAL, CKMB, CKMBINDEX, TROPONINI in the last 168 hours. BNP (last 3 results) No results for input(s): PROBNP in the last 8760 hours. HbA1C: No results for input(s): HGBA1C in the last 72 hours. CBG: No results for input(s): GLUCAP in the last 168 hours. Lipid Profile: No results for input(s): CHOL, HDL, LDLCALC, TRIG, CHOLHDL, LDLDIRECT in the last 72 hours. Thyroid Function Tests: No results for input(s): TSH, T4TOTAL, FREET4, T3FREE, THYROIDAB in the last 72 hours. Anemia Panel: No results for input(s): VITAMINB12, FOLATE, FERRITIN, TIBC, IRON, RETICCTPCT in the last 72 hours. Sepsis Labs: No results for input(s): PROCALCITON, LATICACIDVEN in the last 168 hours.  Recent Results (from the past 240 hour(s))  Culture, blood (routine x 2)      Status: None   Collection Time: 12/18/19 12:23 PM   Specimen: BLOOD  Result Value Ref Range Status   Specimen Description BLOOD SITE NOT SPECIFIED  Final   Special Requests   Final    BOTTLES DRAWN AEROBIC AND ANAEROBIC Blood Culture adequate volume Performed at [facility], [address] [place],  [address]    Culture NO GROWTH 5 DAYS  Final   Report Status 12/23/2019 FINAL  Final  Respiratory Panel by RT PCR (Flu A&B, Covid) - Nasopharyngeal Swab     Status: None   Collection Time: 12/18/19 12:24 PM   Specimen: Nasopharyngeal Swab  Result Value Ref Range Status   SARS Coronavirus 2 by RT PCR NEGATIVE NEGATIVE Final    Comment: (NOTE) SARS-CoV-2 target nucleic acids are NOT DETECTED. The SARS-CoV-2 RNA is generally detectable in upper respiratoy specimens during the acute phase of infection. The lowest concentration of SARS-CoV-2 viral copies this assay can detect is 131 copies/mL. A negative result does not preclude SARS-Cov-2 infection and should not  be used as the sole basis for treatment or other patient management decisions. A negative result may occur with  improper specimen collection/handling, submission of specimen other than nasopharyngeal swab, presence of viral mutation(s) within the areas targeted by this assay, and inadequate number of viral copies (<131 copies/mL). A negative result must be combined with clinical observations, patient history, and epidemiological information. The expected result is Negative. Fact Sheet for Patients:  PinkCheek.be Fact Sheet for Healthcare Providers:  GravelBags.it This test is not yet ap proved or cleared by the [place] FDA and  has been authorized for detection and/or diagnosis of SARS-CoV-2 by FDA under an Emergency Use Authorization (EUA). This EUA will remain  in effect (meaning this test can be used) for the duration of the COVID-19 declaration  under Section 564(b)(1) of the Act, 21 U.S.C. section 360bbb-3(b)(1), unless the authorization is terminated or revoked sooner.    Influenza A by PCR NEGATIVE NEGATIVE Final   Influenza B by PCR NEGATIVE NEGATIVE Final    Comment: (NOTE) The Xpert Xpress SARS-CoV-2/FLU/RSV assay is intended as an aid in  the diagnosis of influenza from Nasopharyngeal swab specimens and  should not be used as a sole basis for treatment. Nasal washings and  aspirates are unacceptable for Xpert Xpress SARS-CoV-2/FLU/RSV  testing. Fact Sheet for Patients: PinkCheek.be Fact Sheet for Healthcare Providers: GravelBags.it This test is not yet approved or cleared by the [place] FDA and  has been authorized for detection and/or diagnosis of SARS-CoV-2 by  FDA under an Emergency Use Authorization (EUA). This EUA will remain  in effect (meaning this test can be used) for the duration of the  Covid-19 declaration under Section 564(b)(1) of the Act, 21  U.S.C. section 360bbb-3(b)(1), unless the authorization is  terminated or revoked. Performed at [facility], [place] [address] [place], [place] [address]   MRSA PCR Screening     Status: None   Collection Time: 12/18/19  6:00 PM   Specimen: Nasopharyngeal  Result Value Ref Range Status   MRSA by PCR NEGATIVE NEGATIVE Final    Comment:        The GeneXpert MRSA Assay (FDA approved for NASAL specimens only), is one component of a comprehensive MRSA colonization surveillance program. It is not intended to diagnose MRSA infection nor to guide or monitor treatment for MRSA infections. Performed at [facility], [place] [address], [place], [place] [address]   Culture, blood (routine x 2)     Status: None   Collection Time: 12/18/19  7:02 PM   Specimen: BLOOD RIGHT HAND  Result Value Ref Range Status   Specimen Description BLOOD RIGHT HAND  Final   Special Requests   Final    BOTTLES  DRAWN AEROBIC ONLY Blood Culture results may not be optimal due to an inadequate volume of blood received in culture bottles Performed at [facility], [place] [address] [place], [place] [address]    Culture NO GROWTH 5 DAYS  Final   Report Status 12/23/2019 FINAL  Final    Radiology Studies: No results found. Scheduled Meds: . calcitRIOL  0.25 mcg Oral Daily  . Chlorhexidine Gluconate Cloth  6 each Topical Daily  . colchicine  0.6 mg Oral BID  . doxycycline  100 mg Oral Q12H  . fluticasone furoate-vilanterol  1 puff Inhalation Daily  . naproxen  500 mg Oral BID WC  . pantoprazole  40 mg Oral BID AC  . polyethylene glycol  17 g  Oral Daily  . predniSONE  5 mg Oral Daily  . rosuvastatin  10 mg Oral QPM  . sodium chloride flush  10-40 mL Intracatheter Q12H  . tacrolimus  5 mg Oral BID  . umeclidinium bromide  1 puff Inhalation Daily   Continuous Infusions: . cefTRIAXone (ROCEPHIN)  IV 1 g (12/26/19 1410)    LOS: 8 days   Time spent: 25 minutes   Paticia Stack, MD [facility]  If 7PM-7AM, please contact night-coverage  Password [email or web address] 12/26/2019, 3:10 PM

## 2019-12-26 NOTE — Progress Notes (Signed)
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  Patient had a colonoscopy 2 days prior to admission, during the procedure patient had a temporary peripheral IV on the right  and later removed after the procedure was done.  She went home and then the next day she developed pain and swelling in the right arm elbow area.  She also was having fever up to 103.    PT Comments    Pt continuing to make steady progress with mobility. She was on 2L of supplemental O2 throughout with SPO2 maintaining at 93-95%. Pt would continue to benefit from skilled physical therapy services at this time while admitted and after d/c to address the below listed limitations in order to improve overall safety and independence with functional mobility.    Follow Up Recommendations  Supervision - Intermittent;Other (comment)(OP Cardiac Rehab)     Equipment Recommendations  Other (comment)(rollator)    Recommendations for Other Services       Precautions / Restrictions Precautions Precautions: Fall Precaution Comments: Keep RUE elevated for edema mgt Restrictions Weight Bearing Restrictions: No    Mobility  Bed Mobility               General bed mobility comments: up to chair upon arrival  Transfers Overall transfer level: Needs assistance Equipment used: Rolling walker (2 wheeled) Transfers: Sit to/from Stand Sit to Stand: Supervision         General transfer comment: for safety  Ambulation/Gait Ambulation/Gait assistance: Supervision Gait Distance (Feet): 250 Feet Assistive device: Rolling walker (2 wheeled) Gait Pattern/deviations: Step-through pattern;Decreased step length - right;Decreased step length -  left;Decreased stride length Gait velocity: mildly decreased, more so when fatigued   General Gait Details: pt overall steady with RW, no LOB or need for physical assistance; pt ambulating on 2L of supplemental O2   Stairs             Wheelchair Mobility    Modified Rankin (Stroke Patients Only)       Balance Overall balance assessment: Needs assistance Sitting-balance support: Feet supported;No upper extremity supported Sitting balance-Leahy Scale: Good     Standing balance support: Bilateral upper extremity supported;During functional activity Standing balance-Leahy Scale: Poor                              Cognition Arousal/Alertness: Awake/alert Behavior During Therapy: WFL for tasks assessed/performed Overall Cognitive Status: Within Functional Limits for tasks assessed                                        Exercises      General Comments        Pertinent Vitals/Pain Pain Assessment: Faces Faces Pain Scale: Hurts even more Pain Location: RUE Pain Descriptors / Indicators: Grimacing;Guarding Pain Intervention(s): Monitored during session;Repositioned    Home Living                      Prior Function            PT Goals (current goals can now be found in the care plan section) Acute Rehab PT  Goals PT Goal Formulation: With patient Time For Goal Achievement: 01/06/20 Potential to Achieve Goals: Good Progress towards PT goals: Progressing toward goals    Frequency    Min 3X/week      PT Plan Current plan remains appropriate    Co-evaluation              AM-PAC PT "6 Clicks" Mobility   Outcome Measure  Help needed turning from your back to your side while in a flat bed without using bedrails?: None Help needed moving from lying on your back to sitting on the side of a flat bed without using bedrails?: None Help needed moving to and from a bed to a chair (including a wheelchair)?: None Help  needed standing up from a chair using your arms (e.g., wheelchair or bedside chair)?: None Help needed to walk in hospital room?: A Little Help needed climbing 3-5 steps with a railing? : A Lot 6 Click Score: 21    End of Session Equipment Utilized During Treatment: Oxygen Activity Tolerance: Patient tolerated treatment well Patient left: with call bell/phone within reach;in chair Nurse Communication: Mobility status PT Visit Diagnosis: Unsteadiness on feet (R26.81)     Time: 1420-1436 PT Time Calculation (min) (ACUTE ONLY): 16 min  Charges:  $Gait Training: 8-22 mins                     Anastasio Champion, DPT  Acute Rehabilitation Services Pager [phone] Office [place] 12/26/2019, 4:25 PM

## 2019-12-27 MED ORDER — OXYCODONE HCL 5 MG PO TABS: 5.0000 mg | ORAL_TABLET | Freq: Four times a day (QID) | ORAL | 0 refills | Status: DC | PRN

## 2019-12-27 MED ORDER — COLCHICINE 0.6 MG PO TABS: 0.6000 mg | ORAL_TABLET | Freq: Two times a day (BID) | ORAL | 0 refills | Status: DC

## 2019-12-27 MED ORDER — NAPROXEN 500 MG PO TABS: 500.0000 mg | ORAL_TABLET | Freq: Two times a day (BID) | ORAL | 0 refills | Status: AC

## 2019-12-27 NOTE — Discharge Summary (Addendum)
Physician Discharge Summary  [name] [id number] DOB: [birth year] DOA: 12/18/2019  PCP: No primary care provider on file.  Admit date: 12/18/2019 Discharge date: 12/27/2019  Admitted From: home Disposition:  home  Recommendations for Outpatient Follow-up:  1. Follow up with PCP in 1-2 weeks 2.   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  Patient had a colonoscopy 2 days prior to admission, during the procedure patient had a temporary peripheral IV on the right breast and later removed after the procedure was done.  She went home and then the next day she developed pain and swelling in the right arm elbow area.  She also was having fever up to 103. Evaluation in the ED show a white blood cell at 19, CRP 12, lactic acid 1.8, BUN 13, creatinine 1.1.  Influenza panel negative respiratory panel negative.  Blood cultures pending.  Elbow x-ray; Findings consistent with olecranon bursitis and adjacent cellulitis. The bones are normal.  Patient admitted with right arm cellulitis, patient on immunosuppressive medication due to her history of renal transplant.  She has a slowly improving on IV antibiotics and upon discharge, she has completed full antibiotics. Given her history of gout and current acute inflammation, added on Colchicine and Naproxen (for 7 days only) with meals and with PPI. She felt the pain is much improved with these two medications and she tolerated well without GI upset or bleeding. ROM has improved. She will go home with HHPT/OT per recommendation.   Her other chronic medical problems stable with asthma, CAD, and h/o kidney transplant. Cr at baseline. Continue home regimen of  immunosuppressants.   From medical standpoint, pt is stable for discharge.   Discharge Diagnoses:  Principal Problem:   Right arm cellulitis Active Problems:   GOUT   Persistent asthma without complication   Coronary artery disease   Hx of CABG   S/P kidney transplant   Olecranon bursitis of right elbow    Discharge Instructions  Discharge Instructions    Diet - low sodium heart healthy   Complete by: As directed    Discharge instructions   Complete by: As directed    Take Naproxen with food twice a day for 7 days Take Colchicine Take 1-2 tablets of oxycodone as needed for moderate to severe pain, if present Plenty of oral hydration Follow up with PCP Avoid hitting or rubbing the right elbow   Increase activity slowly   Complete by: As directed    Increase activity slowly   Complete by: As directed      Allergies as of 12/27/2019      Reactions   Sodium Hypochlorite Shortness Of Breath   Lactose Nausea And Vomiting   Asa Arthritis Strength-antacid [aspirin Buffered] Nausea And Vomiting, Other (See Comments)   STOMACH BURNS, also   Aspirin Nausea And Vomiting   Per pt. "can tolerate the enteric coated tablets".    Atorvastatin Other (See Comments)   Patient's skin was skin was sensitive   Banana Nausea And Vomiting   Lactose Intolerance (gi) Nausea And Vomiting   Lisinopril Cough      Medication List    STOP taking these medications   azaTHIOprine 50 MG tablet Commonly known as: IMURAN     TAKE these medications  acetaminophen 650 MG CR tablet Commonly known as: TYLENOL Take 650 mg by mouth every morning.   albuterol (2.5 MG/3ML) 0.083% nebulizer solution Commonly known as: PROVENTIL Take 2.5 mg by nebulization every 6 (six) hours as needed for wheezing or shortness of breath.   Ventolin HFA 108 (90 Base) MCG/ACT inhaler Generic drug: albuterol Inhale 1-2 puffs into the lungs every 6 (six) hours as needed for wheezing or shortness of breath.    amLODipine 10 MG tablet Commonly known as: NORVASC Take 10 mg by mouth daily.   aspirin EC 81 MG tablet Take 81 mg by mouth daily.   Azelastine HCl 0.15 % Soln Place 1 spray into both nostrils daily as needed for allergies.   Breo Ellipta 200-25 MCG/INH Aepb Generic drug: fluticasone furoate-vilanterol Inhale 1 puff into the lungs daily as needed (respiratory issues.).   calcitRIOL 0.25 MCG capsule Commonly known as: ROCALTROL Take 0.25 mcg by mouth daily.   Cholecalciferol 50 MCG (2000 UT) Tabs Take 2,000 Units by mouth daily.   colchicine 0.6 MG tablet Take 1 tablet (0.6 mg total) by mouth 2 (two) times daily for 15 days.   naproxen 500 MG tablet Commonly known as: NAPROSYN Take 1 tablet (500 mg total) by mouth 2 (two) times daily with a meal for 7 days.   nitroGLYCERIN 0.4 MG SL tablet Commonly known as: NITROSTAT Place 1 tablet (0.4 mg total) under the tongue every 5 (five) minutes as needed for chest pain.   omeprazole 20 MG capsule Commonly known as: PRILOSEC Take 20 mg by mouth daily after breakfast.   oxyCODONE 5 MG immediate release tablet Commonly known as: Oxy IR/ROXICODONE Take 1-2 tablets (5-10 mg total) by mouth every 6 (six) hours as needed for moderate pain or severe pain (5 mg for moderate pain and 10 mg for severe pain).   predniSONE 5 MG tablet Commonly known as: DELTASONE Take 1 tablet (5 mg total) by mouth daily.   rosuvastatin 10 MG tablet Commonly known as: CRESTOR Take 1 tablet (10 mg total) by mouth daily. What changed: when to take this   sennosides-docusate sodium 8.6-50 MG tablet Commonly known as: SENOKOT-S Take 1 tablet by mouth daily as needed (constipation.).   Spiriva Respimat 1.25 MCG/ACT Aers Generic drug: Tiotropium Bromide Monohydrate Inhale 1 puff into the lungs daily.   tacrolimus 1 MG capsule Commonly known as: PROGRAF Take 5 mg by mouth See admin instructions. Take 5 mg by mouth at 9 AM and 5 mg at 9 PM       Follow-up Information    Nahser, Wonda Cheng, MD Follow up in 2 week(s).   Specialty: Cardiology Contact information: [address] [place] [address] [phone]          Allergies  Allergen Reactions  . Sodium Hypochlorite Shortness Of Breath  . Lactose Nausea And Vomiting  . Asa Arthritis Strength-Antacid [Aspirin Buffered] Nausea And Vomiting and Other (See Comments)    STOMACH BURNS, also  . Aspirin Nausea And Vomiting    Per pt. "can tolerate the enteric coated tablets".   . Atorvastatin Other (See Comments)    Patient's skin was skin was sensitive  . Banana Nausea And Vomiting  . Lactose Intolerance (Gi) Nausea And Vomiting  . Lisinopril Cough    Consultations:  none   Procedures/Studies: DG Chest 1 View  Result Date: 12/18/2019 CLINICAL DATA:  Fever. EXAM: CHEST  1 VIEW COMPARISON:  Chest x-ray dated 02/17/2018 FINDINGS: Heart size and pulmonary  vascularity are normal and the lungs are clear. CABG. Aortic atherosclerosis. No acute bone abnormality. IMPRESSION: No acute abnormality. Electronically Signed   By: Lorriane Shire M.D.   On: 12/18/2019 13:32   DG Elbow Complete Right  Result Date: 12/18/2019 CLINICAL DATA:  Right elbow swelling for 2 days. Fever. EXAM: RIGHT ELBOW - COMPLETE 3+ VIEW COMPARISON:  None. FINDINGS: The bones of the right elbow appear normal. No joint effusion. There is subcutaneous edema around the elbow including posterior to the olecranon process. This could represent olecranon bursitis with adjacent cellulitis. IMPRESSION: Findings consistent with olecranon bursitis and adjacent cellulitis. The bones are normal. Electronically Signed   By: Lorriane Shire M.D.   On: 12/18/2019 13:30   MR ELBOW RIGHT WO CONTRAST  Result Date: 12/19/2019 CLINICAL DATA:  Pain and swelling in the right elbow area. EXAM: MRI OF THE RIGHT ELBOW WITHOUT CONTRAST TECHNIQUE: Multiplanar, multisequence MR imaging of the elbow was performed. No  intravenous contrast was administered. COMPARISON:  Radiographs 12/18/2019 FINDINGS: Examination is severely limited by patient motion. No marrow signal abnormality to suggest fracture or osteomyelitis. No obvious joint effusion to suggest septic arthritis. Fairly extensive subcutaneous soft tissue swelling/edema/fluid suggesting cellulitis. No discrete fluid collection to suggest a drainable abscess. Moderate edema in the region of the olecranon bursa but no discrete fluid collection to suggest olecranon bursitis. Grossly the radial and ulnar collateral ligaments are intact and the biceps and triceps tendons are intact. The brachialis tendon is also intact. IMPRESSION: 1. Very limited examination. 2. Diffuse cellulitis without discrete fluid collection to suggest an abscess. 3. No findings suspicious for septic arthritis or osteomyelitis. Electronically Signed   By: Marijo Sanes M.D.   On: 12/19/2019 07:56   IR Fluoro Guide CV Line Right  Result Date: 12/22/2019 INDICATION: 79 year old female with poor venous access in need of IV access. EXAM: IR ULTRASOUND GUIDANCE VASC ACCESS RIGHT; IR RIGHT FLUORO GUIDE CV LINE MEDICATIONS: None ANESTHESIA/SEDATION: None FLUOROSCOPY TIME:  Fluoroscopy Time: 0 minutes 6 seconds (1 mGy). COMPLICATIONS: None immediate. PROCEDURE: Informed written consent was obtained from the patient after a thorough discussion of the procedural risks, benefits and alternatives. All questions were addressed. Maximal Sterile Barrier Technique was utilized including caps, mask, sterile gowns, sterile gloves, sterile drape, hand hygiene and skin antiseptic. A timeout was performed prior to the initiation of the procedure. The right internal jugular vein was interrogated with ultrasound and found to be widely patent. An image was obtained and stored for the medical record. Local anesthesia was attained by infiltration with 1% lidocaine. A small dermatotomy was made. Under real-time sonographic  guidance, the vessel was punctured with a 21 gauge micropuncture needle. Using standard technique, the initial micro needle was exchanged over a 0.018 micro wire for a peel-away sheath. The 0.018 wire was then used to measure the intravascular length to the superior cavoatrial junction. A dual lumen power injectable central venous catheter was then cut to 15 cm and advanced through the peel-away sheath. The peel-away sheath was discarded. A fluoroscopic spot image was obtained documenting the catheter tips at the cavoatrial junction. The catheter was flushed and locked with valved caps. The catheter was secured to the skin with 0 Prolene suture. Sterile bandages were applied. IMPRESSION: Successful placement of a right IJ approach dual lumen power injectable central line. The line is 15 cm in length and the catheter tips are at the superior cavoatrial junction. The catheter is ready for immediate use. Electronically Signed   By: Myrle Sheng  Laurence Ferrari M.D.   On: 12/22/2019 16:39   IR US Guide Vasc Access Right  Result Date: 12/22/2019 INDICATION: 79 year old female with poor venous access in need of IV access. EXAM: IR ULTRASOUND GUIDANCE VASC ACCESS RIGHT; IR RIGHT FLUORO GUIDE CV LINE MEDICATIONS: None ANESTHESIA/SEDATION: None FLUOROSCOPY TIME:  Fluoroscopy Time: 0 minutes 6 seconds (1 mGy). COMPLICATIONS: None immediate. PROCEDURE: Informed written consent was obtained from the patient after a thorough discussion of the procedural risks, benefits and alternatives. All questions were addressed. Maximal Sterile Barrier Technique was utilized including caps, mask, sterile gowns, sterile gloves, sterile drape, hand hygiene and skin antiseptic. A timeout was performed prior to the initiation of the procedure. The right internal jugular vein was interrogated with ultrasound and found to be widely patent. An image was obtained and stored for the medical record. Local anesthesia was attained by infiltration with 1%  lidocaine. A small dermatotomy was made. Under real-time sonographic guidance, the vessel was punctured with a 21 gauge micropuncture needle. Using standard technique, the initial micro needle was exchanged over a 0.018 micro wire for a peel-away sheath. The 0.018 wire was then used to measure the intravascular length to the superior cavoatrial junction. A dual lumen power injectable central venous catheter was then cut to 15 cm and advanced through the peel-away sheath. The peel-away sheath was discarded. A fluoroscopic spot image was obtained documenting the catheter tips at the cavoatrial junction. The catheter was flushed and locked with valved caps. The catheter was secured to the skin with 0 Prolene suture. Sterile bandages were applied. IMPRESSION: Successful placement of a right IJ approach dual lumen power injectable central line. The line is 15 cm in length and the catheter tips are at the superior cavoatrial junction. The catheter is ready for immediate use. Electronically Signed   By: Jacqulynn Cadet M.D.   On: 12/22/2019 16:39      Subjective:   Discharge Exam: Vitals:   12/27/19 0447 12/27/19 0751  BP: 101/67   Pulse: 86   Resp: 16   Temp: 98.4 F (36.9 C)   SpO2: 99% 98%   Vitals:   12/26/19 1623 12/26/19 2114 12/27/19 0447 12/27/19 0751  BP: (!) 94/54 96/75 101/67   Pulse: 80 90 86   Resp: 17 18 16    Temp: (!) 97.4 F (36.3 C) 98.6 F (37 C) 98.4 F (36.9 C)   TempSrc: Oral Oral Oral   SpO2: 100% 99% 99% 98%  Weight:      Height:        General exam: NAD Respiratory system: CTA Cardiovascular system:  S 1, S 2 RRR Gastrointestinal system: BS present, soft, nt Central nervous system: Non focal.   Extremities: ROM of right elbow is much improved Skin: Right arm with improving redness (hard to tell due to her underlying dark skin color).     The results of significant diagnostics from this hospitalization (including imaging, microbiology, ancillary and  laboratory) are listed below for reference.     Microbiology: Recent Results (from the past 240 hour(s))  Culture, blood (routine x 2)     Status: None   Collection Time: 12/18/19 12:23 PM   Specimen: BLOOD  Result Value Ref Range Status   Specimen Description BLOOD SITE NOT SPECIFIED  Final   Special Requests   Final    BOTTLES DRAWN AEROBIC AND ANAEROBIC Blood Culture adequate volume Performed at [facility], [address] [place], [place] [address]    Culture NO GROWTH 5  DAYS  Final   Report Status 12/23/2019 FINAL  Final  Respiratory Panel by RT PCR (Flu A&B, Covid) - Nasopharyngeal Swab     Status: None   Collection Time: 12/18/19 12:24 PM   Specimen: Nasopharyngeal Swab  Result Value Ref Range Status   SARS Coronavirus 2 by RT PCR NEGATIVE NEGATIVE Final    Comment: (NOTE) SARS-CoV-2 target nucleic acids are NOT DETECTED. The SARS-CoV-2 RNA is generally detectable in upper respiratoy specimens during the acute phase of infection. The lowest concentration of SARS-CoV-2 viral copies this assay can detect is 131 copies/mL. A negative result does not preclude SARS-Cov-2 infection and should not be used as the sole basis for treatment or other patient management decisions. A negative result may occur with  improper specimen collection/handling, submission of specimen other than nasopharyngeal swab, presence of viral mutation(s) within the areas targeted by this assay, and inadequate number of viral copies (<131 copies/mL). A negative result must be combined with clinical observations, patient history, and epidemiological information. The expected result is Negative. Fact Sheet for Patients:  PinkCheek.be Fact Sheet for Healthcare Providers:  GravelBags.it This test is not yet ap proved or cleared by the [place] FDA and  has been authorized for detection and/or diagnosis of SARS-CoV-2 by FDA under an  Emergency Use Authorization (EUA). This EUA will remain  in effect (meaning this test can be used) for the duration of the COVID-19 declaration under Section 564(b)(1) of the Act, 21 U.S.C. section 360bbb-3(b)(1), unless the authorization is terminated or revoked sooner.    Influenza A by PCR NEGATIVE NEGATIVE Final   Influenza B by PCR NEGATIVE NEGATIVE Final    Comment: (NOTE) The Xpert Xpress SARS-CoV-2/FLU/RSV assay is intended as an aid in  the diagnosis of influenza from Nasopharyngeal swab specimens and  should not be used as a sole basis for treatment. Nasal washings and  aspirates are unacceptable for Xpert Xpress SARS-CoV-2/FLU/RSV  testing. Fact Sheet for Patients: PinkCheek.be Fact Sheet for Healthcare Providers: GravelBags.it This test is not yet approved or cleared by the [place] FDA and  has been authorized for detection and/or diagnosis of SARS-CoV-2 by  FDA under an Emergency Use Authorization (EUA). This EUA will remain  in effect (meaning this test can be used) for the duration of the  Covid-19 declaration under Section 564(b)(1) of the Act, 21  U.S.C. section 360bbb-3(b)(1), unless the authorization is  terminated or revoked. Performed at [facility], [place] [address], [place], [place] [address]   MRSA PCR Screening     Status: None   Collection Time: 12/18/19  6:00 PM   Specimen: Nasopharyngeal  Result Value Ref Range Status   MRSA by PCR NEGATIVE NEGATIVE Final    Comment:        The GeneXpert MRSA Assay (FDA approved for NASAL specimens only), is one component of a comprehensive MRSA colonization surveillance program. It is not intended to diagnose MRSA infection nor to guide or monitor treatment for MRSA infections. Performed at [facility], [place] [address] [place], [place] [address]   Culture, blood (routine x 2)     Status: None   Collection Time: 12/18/19  7:02 PM    Specimen: BLOOD RIGHT HAND  Result Value Ref Range Status   Specimen Description BLOOD RIGHT HAND  Final   Special Requests   Final    BOTTLES DRAWN AEROBIC ONLY Blood Culture results may not be optimal due to an inadequate volume of blood received  in culture bottles Performed at [facility], [place] [address], [place], [place] [address]    Culture NO GROWTH 5 DAYS  Final   Report Status 12/23/2019 FINAL  Final     Labs: BNP (last 3 results) No results for input(s): BNP in the last 8760 hours. Basic Metabolic Panel: Recent Labs  Lab 12/21/19 0427 12/23/19 0455  NA 137 139  K 3.8 3.5  CL 105 102  CO2 23 27  GLUCOSE 103* 124*  BUN 11 8  CREATININE 1.09* 1.11*  CALCIUM 8.2* 9.1   Liver Function Tests: No results for input(s): AST, ALT, ALKPHOS, BILITOT, PROT, ALBUMIN in the last 168 hours. No results for input(s): LIPASE, AMYLASE in the last 168 hours. No results for input(s): AMMONIA in the last 168 hours. CBC: Recent Labs  Lab 12/21/19 0427 12/23/19 0455  WBC 9.9 9.4  HGB 10.9* 11.4*  HCT 31.7* 33.0*  MCV 77.5* 77.6*  PLT 124* 147*   Cardiac Enzymes: No results for input(s): CKTOTAL, CKMB, CKMBINDEX, TROPONINI in the last 168 hours. BNP: Invalid input(s): POCBNP CBG: No results for input(s): GLUCAP in the last 168 hours. D-Dimer No results for input(s): DDIMER in the last 72 hours. Hgb A1c No results for input(s): HGBA1C in the last 72 hours. Lipid Profile No results for input(s): CHOL, HDL, LDLCALC, TRIG, CHOLHDL, LDLDIRECT in the last 72 hours. Thyroid function studies No results for input(s): TSH, T4TOTAL, T3FREE, THYROIDAB in the last 72 hours.  Invalid input(s): FREET3 Anemia work up No results for input(s): VITAMINB12, FOLATE, FERRITIN, TIBC, IRON, RETICCTPCT in the last 72 hours. Urinalysis    Component Value Date/Time   COLORURINE YELLOW 02/12/2018 2129   APPEARANCEUR CLEAR 02/12/2018 2129   LABSPEC >1.046 (H) 02/12/2018 2129   PHURINE 5.0  02/12/2018 2129   GLUCOSEU NEGATIVE 02/12/2018 2129   GLUCOSEU NEGATIVE 06/21/2012 0819   HGBUR NEGATIVE 02/12/2018 2129   BILIRUBINUR NEGATIVE 02/12/2018 2129   KETONESUR 5 (A) 02/12/2018 2129   PROTEINUR 100 (A) 02/12/2018 2129   UROBILINOGEN 0.2 12/10/2012 1056   NITRITE NEGATIVE 02/12/2018 2129   LEUKOCYTESUR NEGATIVE 02/12/2018 2129   Sepsis Labs Invalid input(s): PROCALCITONIN,  WBC,  LACTICIDVEN Microbiology Recent Results (from the past 240 hour(s))  Culture, blood (routine x 2)     Status: None   Collection Time: 12/18/19 12:23 PM   Specimen: BLOOD  Result Value Ref Range Status   Specimen Description BLOOD SITE NOT SPECIFIED  Final   Special Requests   Final    BOTTLES DRAWN AEROBIC AND ANAEROBIC Blood Culture adequate volume Performed at [facility], [place] [address], [place], [place] [address]    Culture NO GROWTH 5 DAYS  Final   Report Status 12/23/2019 FINAL  Final  Respiratory Panel by RT PCR (Flu A&B, Covid) - Nasopharyngeal Swab     Status: None   Collection Time: 12/18/19 12:24 PM   Specimen: Nasopharyngeal Swab  Result Value Ref Range Status   SARS Coronavirus 2 by RT PCR NEGATIVE NEGATIVE Final    Comment: (NOTE) SARS-CoV-2 target nucleic acids are NOT DETECTED. The SARS-CoV-2 RNA is generally detectable in upper respiratoy specimens during the acute phase of infection. The lowest concentration of SARS-CoV-2 viral copies this assay can detect is 131 copies/mL. A negative result does not preclude SARS-Cov-2 infection and should not be used as the sole basis for treatment or other patient management decisions. A negative result may occur with  improper specimen collection/handling, submission of specimen other  than nasopharyngeal swab, presence of viral mutation(s) within the areas targeted by this assay, and inadequate number of viral copies (<131 copies/mL). A negative result must be combined with clinical observations, patient history, and  epidemiological information. The expected result is Negative. Fact Sheet for Patients:  PinkCheek.be Fact Sheet for Healthcare Providers:  GravelBags.it This test is not yet ap proved or cleared by the [place] FDA and  has been authorized for detection and/or diagnosis of SARS-CoV-2 by FDA under an Emergency Use Authorization (EUA). This EUA will remain  in effect (meaning this test can be used) for the duration of the COVID-19 declaration under Section 564(b)(1) of the Act, 21 U.S.C. section 360bbb-3(b)(1), unless the authorization is terminated or revoked sooner.    Influenza A by PCR NEGATIVE NEGATIVE Final   Influenza B by PCR NEGATIVE NEGATIVE Final    Comment: (NOTE) The Xpert Xpress SARS-CoV-2/FLU/RSV assay is intended as an aid in  the diagnosis of influenza from Nasopharyngeal swab specimens and  should not be used as a sole basis for treatment. Nasal washings and  aspirates are unacceptable for Xpert Xpress SARS-CoV-2/FLU/RSV  testing. Fact Sheet for Patients: PinkCheek.be Fact Sheet for Healthcare Providers: GravelBags.it This test is not yet approved or cleared by the [place] FDA and  has been authorized for detection and/or diagnosis of SARS-CoV-2 by  FDA under an Emergency Use Authorization (EUA). This EUA will remain  in effect (meaning this test can be used) for the duration of the  Covid-19 declaration under Section 564(b)(1) of the Act, 21  U.S.C. section 360bbb-3(b)(1), unless the authorization is  terminated or revoked. Performed at [facility], [place] [address], [place], [place] [address]   MRSA PCR Screening     Status: None   Collection Time: 12/18/19  6:00 PM   Specimen: Nasopharyngeal  Result Value Ref Range Status   MRSA by PCR NEGATIVE NEGATIVE Final    Comment:        The GeneXpert MRSA Assay (FDA approved for NASAL  specimens only), is one component of a comprehensive MRSA colonization surveillance program. It is not intended to diagnose MRSA infection nor to guide or monitor treatment for MRSA infections. Performed at [facility], [place] [address] [place], [place] [address]   Culture, blood (routine x 2)     Status: None   Collection Time: 12/18/19  7:02 PM   Specimen: BLOOD RIGHT HAND  Result Value Ref Range Status   Specimen Description BLOOD RIGHT HAND  Final   Special Requests   Final    BOTTLES DRAWN AEROBIC ONLY Blood Culture results may not be optimal due to an inadequate volume of blood received in culture bottles Performed at [facility], [place] [address], [place], [place] [address]    Culture NO GROWTH 5 DAYS  Final   Report Status 12/23/2019 FINAL  Final     Time coordinating discharge: 37 min  SIGNED:   Paticia Stack, MD  [facility] 12/27/2019, 12:33 PM Pager   If 7PM-7AM, please contact night-coverage [email or web address] Password [email or web address]

## 2019-12-27 NOTE — TOC Transition Note (Signed)
Transition of Care [facility]) - CM/SW Discharge Note   Patient Details  Name: [name] MRN: [id number] Date of Birth: [birth year]  Transition of Care [facility]) CM/SW Contact:  Claudie Leach, RN 12/27/2019, 4:11 PM   Clinical Narrative:    Pt to d/c home with [facility].  Reviewed Medicare rated [place] list with patient.  She has no preference of [place] agency.  Patient agreeable to [facility].  Referral accepted by [place] with Alvis Lemmings.    Patient requests rollator for d/c.  Rollator will be delivered to room prior to dc.    Final next level of care: [place] Barriers to Discharge: No Barriers Identified   Patient Goals and CMS Choice Patient states their goals for this hospitalization and ongoing recovery are:: to get home CMS Medicare.gov Compare Post Acute Care list provided to:: Patient Choice offered to / list presented to : Patient   Discharge Plan and Services                DME Arranged: [place] rolling with seat DME Agency: [employer] Date DME Agency Contacted: 12/27/19 Time DME Agency Contacted: 73 Representative spoke with at DME Agency: [place]: PT, OT [place] Agency: [place] Date [place]: 12/27/19 Time [place]: 1611 Representative spoke with at [place]: Tommi Rumps

## 2019-12-27 NOTE — Progress Notes (Signed)
Patient discharged to home with instructions and prescription. 

## 2019-12-27 NOTE — Plan of Care (Signed)

## 2020-01-02 NOTE — Progress Notes (Signed)
Cardiology Office Note    Date:  01/05/2020   ID:  [name], DOB 1941/01/03, MRN [id number]  PCP:  Chesley Noon, MD  Cardiologist:  Dr. Acie Fredrickson  Chief Complaint: Hospital follow up   History of Present Illness:   [name] is a 79 y.o. female with history of CAD s/p CABG, hyperlipidemia, diabetes mellitus, kidney transplant on immunosuppression, hypertension, asthma on 2L oxygen 24/7 presents for hospital follow-up.  Last seen by Dr. Acie Fredrickson April 2020.  Admitted January 28- December 27, 2019  for right arm cellulitis after placement of temporary peripheral IV on right breast for few days prior.  MRI negative for abscess.  Clinically improved on IV antibiotic.  Discharge on home health PT OT.  Here today for follow-up.  Patient reports dyspnea on exertion for past 3 to 4 months.  This is new.  She denies associated palpitation or chest tightness.  She uses oxygen 24/7 secondary to asthma.  Denies orthopnea, PND, syncope, lower extremity edema or melena.  Prior echocardiogram with diastolic dysfunction.   Past Medical History:  Diagnosis Date  . 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 Asthma   . Cellulitis 12/19/2019   right upper arm  . Coronary artery disease    cath 11/2007, grafts patent /  Nuclear, June, 2011, prior inferior MI with mild peri-infarct ischemia, anterior breast attenuation, EF 67%, done for renal transplant assessment / Low level exercise/Lexiscan Myoview (11/2013): EF 67%, no ischemi; normal study  . Diabetes mellitus    type 2  . ESRD on dialysis [facility])    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, 2012  . Family history of breast cancer   . Family history of prostate cancer   . GERD (gastroesophageal reflux disease)   . GI bleed 11/26/2010   January, 2012 , AVM  . Gout   . History of methicillin resistant staphylococcus aureus (MRSA)   . Hyperlipidemia   .  Hyperparathyroidism   . Hypertension   . Myocardial infarction ([place])   . Osteoporosis   . Pneumonia 08/2010    Hospitalization, October, 2011  . Primary osteoarthritis, 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  . S/P kidney transplant    September, 2012, [facility]  . Subacromial impingement of 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    Past Surgical History:  Procedure Laterality Date  . ABDOMINAL HYSTERECTOMY  1980'S  . ARTERIOVENOUS GRAFT PLACEMENT Left 03/04/2007   forearm  . BIOPSY  09/30/2019   Procedure: BIOPSY;  Surgeon: Wilford Corner, MD;  Location: [facility];  Service: Endoscopy;;  . BREAST EXCISIONAL BIOPSY Right    benign more than 10 yr ago  . BREAST SURGERY  YRS AGO   RT BREAST CYST REMOVED   . CARDIAC CATHETERIZATION  06/03/2002; 12/04/2007  . COLONOSCOPY WITH PROPOFOL N/A 07/14/2014   Procedure: COLONOSCOPY WITH PROPOFOL;  Surgeon: Lear Ng, MD;  Location: [facility];  Service: Endoscopy;  Laterality: N/A;  . COLONOSCOPY WITH PROPOFOL N/A 09/30/2019   Procedure: COLONOSCOPY WITH PROPOFOL;  Surgeon: Wilford Corner, MD;  Location: [facility];  Service: Endoscopy;  Laterality: N/A;  . CORONARY ARTERY BYPASS GRAFT  06/06/2002   x 4  . HOT HEMOSTASIS N/A 07/14/2014   Procedure: HOT HEMOSTASIS (ARGON PLASMA COAGULATION/BICAP);  Surgeon: Lear Ng, MD;  Location: [facility];  Service: Endoscopy;  Laterality: N/A;  . IR FLUORO GUIDE CV LINE RIGHT  12/22/2019  . IR  US GUIDE VASC ACCESS RIGHT  12/22/2019  . KIDNEY TRANSPLANT Right 08/04/2011  . ORIF PATELLA Left 04/06/2016   Procedure: OPEN REDUCTION INTERNAL (ORIF) LEFT  PATELLA;  Surgeon: Ninetta Lights, MD;  Location: [place];  Service: Orthopedics;  Laterality: Left;  . POLYPECTOMY  09/30/2019   Procedure: POLYPECTOMY;  Surgeon: Wilford Corner, MD;  Location: [facility];  Service: Endoscopy;;  . SHOULDER ARTHROSCOPY WITH ROTATOR CUFF REPAIR AND SUBACROMIAL DECOMPRESSION Left 08/03/2016   Procedure: LEFT  SHOULDER ARTHROSCOPY WITH ROTATOR CUFF REPAIR AND SUBACROMIAL DECOMPRESSION with distal claviculectomy and extentsive debridement;  Surgeon: Ninetta Lights, MD;  Location: [place];  Service: Orthopedics;  Laterality: Left;  Block  . THROMBECTOMY AND REVISION OF ARTERIOVENTOUS (AV) GORETEX  GRAFT Left 05/08/2007   forearm  . TOTAL HIP ARTHROPLASTY  12/18/2012   Procedure: TOTAL HIP ARTHROPLASTY;  Surgeon: Ninetta Lights, MD;  Location: [place];  Service: Orthopedics;  Laterality: Left;  Marland Kitchen VESICOVAGINAL FISTULA CLOSURE W/ TAH  1984    Current Medications:  Prior to Admission medications   Medication Sig Start Date End Date Taking? Authorizing Provider  acetaminophen (TYLENOL) 650 MG CR tablet Take 650 mg by mouth every morning.   Yes [provider]  albuterol (PROVENTIL) (2.5 MG/3ML) 0.083% nebulizer solution Take 2.5 mg by nebulization every 6 (six) hours as needed for wheezing or shortness of breath.   Yes [provider]  albuterol (VENTOLIN HFA) 108 (90 Base) MCG/ACT inhaler Inhale 1-2 puffs into the lungs every 6 (six) hours as needed for wheezing or shortness of breath.   Yes [provider]  amLODipine (NORVASC) 10 MG tablet Take 10 mg by mouth daily.    Yes [provider]  aspirin EC 81 MG tablet Take 81 mg by mouth daily.   Yes [provider]  Azelastine HCl 0.15 % SOLN Place 1 spray into both nostrils daily as needed for allergies.   Yes [provider]  calcitRIOL (ROCALTROL) 0.25 MCG capsule Take 0.25 mcg by mouth daily.    Yes [provider]  colchicine 0.6 MG tablet Take 1 tablet (0.6 mg total) by mouth 2 (two) times daily for 15 days. 12/27/19 01/11/20 Yes Paticia Stack, MD  fluticasone furoate-vilanterol (BREO ELLIPTA) 200-25 MCG/INH AEPB Inhale 1 puff into the lungs daily as needed (respiratory issues.).  10/23/17  Yes [provider]  nitroGLYCERIN (NITROSTAT) 0.4 MG SL tablet Place 1 tablet  (0.4 mg total) under the tongue every 5 (five) minutes as needed for chest pain. 08/19/18  Yes Nahser, Wonda Cheng, MD  omeprazole (PRILOSEC) 20 MG capsule Take 20 mg by mouth daily after breakfast.    Yes [provider]  predniSONE (DELTASONE) 5 MG tablet Take 1 tablet (5 mg total) by mouth daily. 03/04/18  Yes Mariel Aloe, MD  rosuvastatin (CRESTOR) 10 MG tablet Take 1 tablet (10 mg total) by mouth daily. Patient taking differently: Take 10 mg by mouth every evening.  02/21/19 02/16/20 Yes Nahser, Wonda Cheng, MD  sennosides-docusate sodium (SENOKOT-S) 8.6-50 MG tablet Take 1 tablet by mouth daily as needed (constipation.).    Yes [provider]  tacrolimus (PROGRAF) 1 MG capsule Take 5 mg by mouth See admin instructions. Take 5 mg by mouth at 9 AM and 5 mg at 9 PM   Yes [provider]  Tiotropium Bromide Monohydrate (SPIRIVA RESPIMAT) 1.25 MCG/ACT AERS Inhale 1 puff into the lungs daily. 10/23/17  Yes [provider]  oxyCODONE (OXY IR/ROXICODONE) 5 MG immediate release tablet Take 1-2 tablets (5-10 mg total) by mouth every 6 (six) hours as needed for moderate pain or severe pain (5 mg for moderate pain and 10 mg for severe pain). Patient not taking: Reported on 01/05/2020 12/27/19   Paticia Stack, MD    Allergies:   Sodium hypochlorite, Lactose, Asa arthritis strength-antacid [aspirin buffered], Aspirin, Atorvastatin, Banana, Lactose intolerance (gi), and Lisinopril   Social History   Socioeconomic History  . Marital status: Divorced    Spouse name: Not on file  . Number of children: Not on file  . Years of education: Not on file  . Highest education level: Not on file  Occupational History  . Not on file  Tobacco Use  . Smoking status: Former Smoker    Packs/day: 1.00    Years: 8.00    Pack years: 8.00    Types: Cigarettes    Quit date: 11/21/1983    Years since quitting: 36.1  . Smokeless tobacco: Never Used  Substance and Sexual Activity  . Alcohol  use: No  . Drug use: No  . Sexual activity: Not on file  Other Topics Concern  . Not on file  Social History Narrative  . Not on file   Social Determinants of Health   Financial Resource Strain:   . Difficulty of Paying Living Expenses: Not on file  Food Insecurity:   . Worried About Charity fundraiser in the Last Year: Not on file  . Ran Out of Food in the Last Year: Not on file  Transportation Needs:   . Lack of Transportation (Medical): Not on file  . Lack of Transportation (Non-Medical): Not on file  Physical Activity:   . Days of Exercise per Week: Not on file  . Minutes of Exercise per Session: Not on file  Stress:   . Feeling of Stress : Not on file  Social Connections:   . Frequency of Communication with Friends and Family: Not on file  . Frequency of Social Gatherings with Friends and Family: Not on file  . Attends Religious Services: Not on file  . Active Member of Clubs or Organizations: Not on file  . Attends Archivist Meetings: Not on file  . Marital Status: Not on file     Family History:  The patient's family history includes Breast cancer (age of onset: 87) in her sister; Lung cancer in her brother; Prostate cancer in her brother.   ROS:   Please see the history of present illness.    ROS All other systems reviewed and are negative.   PHYSICAL EXAM:   VS:  BP 138/78   Pulse (!) 104   Ht 5\' 3"  (1.6 m)   Wt 168 lb (76.2 kg)   BMI 29.76 kg/m    GEN: Well nourished, well developed, in no acute distress  HEENT: normal  Neck: no JVD, carotid bruits, or masses Cardiac: RRR; no murmurs, rubs, or gallops,no edema  Respiratory:  clear to auscultation bilaterally, normal work of breathing GI: soft, nontender, nondistended, + BS MS: no deformity or atrophy  Skin: warm and dry, no rash Neuro:  Alert and Oriented x 3, Strength and sensation are intact Psych: euthymic mood, full affect  Wt Readings from Last 3 Encounters:  01/05/20 168 lb (76.2  kg)  12/18/19 165 lb (74.8 kg)  10/02/19 163 lb 9.6 oz (74.2 kg)      Studies/Labs Reviewed:   EKG:  EKG is  not  ordered today.    Recent Labs: 12/18/2019: ALT 12 12/23/2019: BUN 8; Creatinine, Ser 1.11; Hemoglobin 11.4; Platelets 147; Potassium 3.5; Sodium 139   Lipid Panel    Component Value Date/Time   CHOL 171 06/05/2018 0826   TRIG 125 06/05/2018 0826   HDL 71 06/05/2018 0826   CHOLHDL 2.4 06/05/2018 0826   CHOLHDL 2.6 02/14/2018 0223   VLDL 20 02/14/2018 0223   LDLCALC 75 06/05/2018 0826   LDLDIRECT 136.3 02/26/2012 1715    Additional studies/ records that were reviewed today include:   Echocardiogram: 01/2018 Study Conclusions   - Left ventricle: The cavity size was normal. Wall thickness was  increased in a pattern of moderate LVH. Systolic function was  normal. The estimated ejection fraction was in the range of 60%  to 65%. Wall motion was normal; there were no regional wall  motion abnormalities. Doppler parameters are consistent with  abnormal left ventricular relaxation (grade 1 diastolic  dysfunction). The E/e&' ratio is between 8-15, suggesting  indeterminate LV filling pressure.  - Aortic valve: Sclerosis without stenosis. There was trivial  regurgitation.  - Mitral valve: Mildly thickened leaflets . There was trivial  regurgitation.  - Left atrium: The atrium was normal in size.  - Right atrium: The atrium was mildly dilated.  - Atrial septum: A patent foramen ovale cannot be excluded.  - Inferior vena cava: The vessel was normal in size. The  respirophasic diameter changes were in the normal range (>= 50%),  consistent with normal central venous pressure.   Impressions:   - Compared to a prior study in 2018, the LVEF is unchanged.   Stress test 03/2017  Nuclear stress EF: 76%.  Inferior/inferolateral thinning with improvement in the inferolateral region (mid/distal) consistent with soft tissue attenuation and small region  of ischemia.  This is a low risk study.   ASSESSMENT & PLAN:   1.  Dyspnea on exertion/chronic diastolic dysfunction -Likely due to deconditioning +/-diastolic dysfunction.  No exertional chest tightness to suggest angina. She does not have orthopnea or PND.  She uses 2 L oxygen 24/7 for asthma.  She does have history of mild diastolic dysfunction on last echocardiogram in 2019.  Check BNP.  Add beta-blocker as below.  If no improvement consider echocardiogram at follow-up.  2.  CAD s/p CABG -Dyspnea on exertion did not sound like angina.  The symptoms is different when she had CABG. -Continue aspirin and statin. -Add beta-blocker for sinus tachycardia.  3.  Sinus tachycardia -Patient denies any palpitation.  She was tachycardic in 100s when admitted.  Her heart rate was 109 when seen 2019. -Add low-dose beta-blocker. -If no improvement, consider Holter monitor at follow-up  4.  Hypertension -Blood pressure stable on amlodipine -Add beta-blocker as above  5.  History of kidney transplant -On immunosuppressive therapy -Managed by Dr. Jimmy Footman  6.  Hyperlipidemia -managed by PCP - Continue statin - LDL goal less than 70  Medication Adjustments/Labs and Tests Ordered: Current medicines are reviewed at length with the patient today.  Concerns regarding medicines are outlined above.  Medication changes, Labs and Tests ordered today are listed in the Patient Instructions below. Patient Instructions  Medication Instructions:  Your physician has recommended you make the following change in your medication:  START Metoprolol tartrate (Lopressor) 25 mg twice daily  *If you need a refill on your cardiac medications before your next appointment, please call your pharmacy*  Lab Work: TODAY - pro-BNP If you have labs (blood work)  drawn today and your tests are completely normal, you will receive your results only by: Marland Kitchen MyChart Message (if you have MyChart) OR . A paper copy in the  mail If you have any lab test that is abnormal or we need to change your treatment, we will call you to review the results.   Testing/Procedures: None Ordered   Follow-Up: At [facility], you and your health needs are our priority.  As part of our continuing mission to provide you with exceptional heart care, we have created designated Provider Care Teams.  These Care Teams include your primary Cardiologist (physician) and Advanced Practice Providers (APPs -  Physician Assistants and Nurse Practitioners) who all work together to provide you with the care you need, when you need it.  Your next appointment:   2 month(s)  On April 15 at 9:20 am  The format for your next appointment:   In Person  Provider:   Mertie Moores, MD      Signed, Leanor Kail, [place]  01/05/2020 12:31 PM    [address], [place], [place]  [address] Phone: [phone]; Fax: [phone]

## 2020-01-05 ENCOUNTER — Encounter: Payer: Self-pay | Admitting: Physician Assistant

## 2020-01-05 ENCOUNTER — Ambulatory Visit: Payer: Medicare HMO | Admitting: Physician Assistant

## 2020-01-05 ENCOUNTER — Other Ambulatory Visit: Payer: Self-pay

## 2020-01-05 ENCOUNTER — Encounter (INDEPENDENT_AMBULATORY_CARE_PROVIDER_SITE_OTHER): Payer: Self-pay

## 2020-01-05 VITALS — BP 138/78 | HR 104 | Ht 63.0 in | Wt 168.0 lb

## 2020-01-05 DIAGNOSIS — I2510 Atherosclerotic heart disease of native coronary artery without angina pectoris: Secondary | ICD-10-CM | POA: Diagnosis not present

## 2020-01-05 DIAGNOSIS — Z940 Kidney transplant status: Secondary | ICD-10-CM

## 2020-01-05 DIAGNOSIS — I5032 Chronic diastolic (congestive) heart failure: Secondary | ICD-10-CM | POA: Diagnosis not present

## 2020-01-05 DIAGNOSIS — R0609 Other forms of dyspnea: Secondary | ICD-10-CM

## 2020-01-05 DIAGNOSIS — I10 Essential (primary) hypertension: Secondary | ICD-10-CM | POA: Diagnosis not present

## 2020-01-05 DIAGNOSIS — R0600 Dyspnea, unspecified: Secondary | ICD-10-CM

## 2020-01-05 DIAGNOSIS — E785 Hyperlipidemia, unspecified: Secondary | ICD-10-CM

## 2020-01-05 MED ORDER — METOPROLOL TARTRATE 25 MG PO TABS: 25.0000 mg | ORAL_TABLET | Freq: Two times a day (BID) | ORAL | 11 refills | Status: DC

## 2020-01-05 NOTE — Patient Instructions (Addendum)
Medication Instructions:  Your physician has recommended you make the following change in your medication:  START Metoprolol tartrate (Lopressor) 25 mg twice daily  *If you need a refill on your cardiac medications before your next appointment, please call your pharmacy*  Lab Work: TODAY - pro-BNP If you have labs (blood work) drawn today and your tests are completely normal, you will receive your results only by: Marland Kitchen MyChart Message (if you have MyChart) OR . A paper copy in the mail If you have any lab test that is abnormal or we need to change your treatment, we will call you to review the results.   Testing/Procedures: None Ordered   Follow-Up: At [facility], you and your health needs are our priority.  As part of our continuing mission to provide you with exceptional heart care, we have created designated Provider Care Teams.  These Care Teams include your primary Cardiologist (physician) and Advanced Practice Providers (APPs -  Physician Assistants and Nurse Practitioners) who all work together to provide you with the care you need, when you need it.  Your next appointment:   2 month(s)  On April 15 at 9:20 am  The format for your next appointment:   In Person  Provider:   Mertie Moores, MD

## 2020-01-06 LAB — PRO B NATRIURETIC PEPTIDE: NT-Pro BNP: 292 pg/mL (ref 0–738)

## 2020-01-19 ENCOUNTER — Other Ambulatory Visit: Payer: Self-pay

## 2020-01-19 ENCOUNTER — Ambulatory Visit: Payer: Medicare HMO | Attending: Internal Medicine

## 2020-01-19 DIAGNOSIS — Z23 Encounter for immunization: Secondary | ICD-10-CM

## 2020-01-19 NOTE — Progress Notes (Signed)
   Covid-19 Vaccination Clinic  Name:  [name]    MRN: [id number] DOB: [birth year]  01/19/2020  [name] was observed post Covid-19 immunization for 15 minutes without incidence. She was provided with Vaccine Information Sheet and instruction to access the V-Safe system.   [name] was instructed to call 911 with any severe reactions post vaccine: Marland Kitchen Difficulty breathing  . Swelling of your face and throat  . A fast heartbeat  . A bad rash all over your body  . Dizziness and weakness    Immunizations Administered    Name Date Dose VIS Date Route   Pfizer COVID-19 Vaccine 01/19/2020  9:43 AM 0.3 mL 10/31/2019 Intramuscular   Manufacturer: [place]   Lot: [id number]   [place]: [id number]    .cov

## 2020-01-29 ENCOUNTER — Other Ambulatory Visit: Payer: Self-pay

## 2020-02-02 ENCOUNTER — Encounter: Payer: Self-pay | Admitting: Endocrinology

## 2020-02-02 ENCOUNTER — Other Ambulatory Visit: Payer: Self-pay

## 2020-02-02 ENCOUNTER — Ambulatory Visit (INDEPENDENT_AMBULATORY_CARE_PROVIDER_SITE_OTHER): Payer: Medicare HMO | Admitting: Endocrinology

## 2020-02-02 VITALS — BP 150/90 | HR 80 | Ht 63.0 in | Wt 159.6 lb

## 2020-02-02 DIAGNOSIS — N1831 Chronic kidney disease, stage 3a: Secondary | ICD-10-CM

## 2020-02-02 DIAGNOSIS — E1122 Type 2 diabetes mellitus with diabetic chronic kidney disease: Secondary | ICD-10-CM

## 2020-02-02 DIAGNOSIS — E1121 Type 2 diabetes mellitus with diabetic nephropathy: Secondary | ICD-10-CM

## 2020-02-02 DIAGNOSIS — Z794 Long term (current) use of insulin: Secondary | ICD-10-CM

## 2020-02-02 LAB — POCT GLYCOSYLATED HEMOGLOBIN (HGB A1C): Hemoglobin A1C: 6.1 % — AB (ref 4.0–5.6)

## 2020-02-02 NOTE — Patient Instructions (Addendum)
Your blood pressure is slightly high today.  Please continue to follow this up with Dr Deterding.  check your blood sugar once a week.  vary the time of day when you check, between before the 3 meals, and at bedtime.  also check if you have symptoms of your blood sugar being too high or too low.  please keep a record of the readings and bring it to your next appointment here (or you can bring the meter itself).  You can write it on any piece of paper.  please call us sooner if your blood sugar goes below 70, or if you have a lot of readings over 200.   No medication is needed for the diabetes now.  However, please call if the prednisone is increased.   Keep the burn on your foot covered with antibiotic ointment and large bandaids, until it heals.  Please call next week if it is not much better by then.   Please come back for a follow-up appointment in 4 months.

## 2020-02-02 NOTE — Progress Notes (Signed)
Subjective:    Patient ID: [name], female    DOB: 06-May-1941, 79 y.o.   MRN: [id number]  HPI Pt returns for f/u of diabetes mellitus: DM type: 2 Dx'ed: 0000000 Complications: CAD, PAD (seen on CT), and renal failure (transplant 2013).   Therapy: no medication now GDM: never DKA: never Severe hypoglycemia: never Pancreatitis: never Pancreatic imaging: normal on 2015 CT Other: she has never been on insulin; she intermittently takes steroids for COPD; she requests generic meds only.   Interval history: pt says cbg's are in the low-100's.  pt states she feels well in general, except for chronic sob.  No recent change in low prednisone dosage. She spilled hot water on the left foot, 4 days ago.   Past Medical History:  Diagnosis Date  . 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 Asthma   . Cellulitis 12/19/2019   right upper arm  . Coronary artery disease    cath 11/2007, grafts patent /  Nuclear, June, 2011, prior inferior MI with mild peri-infarct ischemia, anterior breast attenuation, EF 67%, done for renal transplant assessment / Low level exercise/Lexiscan Myoview (11/2013): EF 67%, no ischemi; normal study  . Diabetes mellitus    type 2  . ESRD on dialysis [facility])    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, 2012  . Family history of breast cancer   . Family history of prostate cancer   . GERD (gastroesophageal reflux disease)   . GI bleed 11/26/2010   January, 2012 , AVM  . Gout   . History of methicillin resistant staphylococcus aureus (MRSA)   . Hyperlipidemia   . Hyperparathyroidism   . Hypertension   . Myocardial infarction ([place])   . Osteoporosis   . Pneumonia 08/2010    Hospitalization, October, 2011  . Primary osteoarthritis, 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  . S/P kidney transplant    September, 2012, [facility]  . Subacromial impingement of 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    Past Surgical History:  Procedure  Laterality Date  . ABDOMINAL HYSTERECTOMY  1980'S  . ARTERIOVENOUS GRAFT PLACEMENT Left 03/04/2007   forearm  . BIOPSY  09/30/2019   Procedure: BIOPSY;  Surgeon: Wilford Corner, MD;  [facility];  Service: Endoscopy;;  . BREAST EXCISIONAL BIOPSY Right    benign more than 10 yr ago  . BREAST SURGERY  YRS AGO   RT BREAST CYST REMOVED   . CARDIAC CATHETERIZATION  06/03/2002; 12/04/2007  . COLONOSCOPY WITH PROPOFOL N/A 07/14/2014   Procedure: COLONOSCOPY WITH PROPOFOL;  Surgeon: Lear Ng, MD;  [facility];  Service: Endoscopy;  Laterality: N/A;  . COLONOSCOPY WITH PROPOFOL N/A 09/30/2019   Procedure: COLONOSCOPY WITH PROPOFOL;  Surgeon: Wilford Corner, MD;  [facility];  Service: Endoscopy;  Laterality: N/A;  . CORONARY ARTERY BYPASS GRAFT  06/06/2002   x 4  . HOT HEMOSTASIS N/A 07/14/2014   Procedure: HOT HEMOSTASIS (ARGON PLASMA COAGULATION/BICAP);  Surgeon: Lear Ng, MD;  Location[facility];  Service: Endoscopy;  Laterality: N/A;  . IR FLUORO GUIDE CV LINE RIGHT  12/22/2019  . IR US GUIDE VASC ACCESS RIGHT  12/22/2019  . KIDNEY TRANSPLANT Right 08/04/2011  . ORIF PATELLA Left 04/06/2016   Procedure: OPEN REDUCTION INTERNAL (ORIF) LEFT  PATELLA;  Surgeon: Ninetta Lights, MD;  Location: [place];  Service: Orthopedics;  Laterality: Left;  . POLYPECTOMY  09/30/2019  Procedure: POLYPECTOMY;  Surgeon: Wilford Corner, MD;  [facility];  Service: Endoscopy;;  . SHOULDER ARTHROSCOPY WITH ROTATOR CUFF REPAIR AND SUBACROMIAL DECOMPRESSION Left 08/03/2016   Procedure: LEFT SHOULDER ARTHROSCOPY WITH ROTATOR CUFF REPAIR AND SUBACROMIAL DECOMPRESSION with distal claviculectomy and extentsive debridement;  Surgeon: Ninetta Lights, MD;  Location: [place];  Service: Orthopedics;  Laterality: Left;  Block  . THROMBECTOMY AND REVISION OF ARTERIOVENTOUS (AV) GORETEX  GRAFT Left 05/08/2007   forearm  .  TOTAL HIP ARTHROPLASTY  12/18/2012   Procedure: TOTAL HIP ARTHROPLASTY;  Surgeon: Ninetta Lights, MD;  Location: [place];  Service: Orthopedics;  Laterality: Left;  Marland Kitchen VESICOVAGINAL FISTULA CLOSURE W/ TAH  1984    Social History   Socioeconomic History  . Marital status: Divorced    Spouse name: Not on file  . Number of children: Not on file  . Years of education: Not on file  . Highest education level: Not on file  Occupational History  . Not on file  Tobacco Use  . Smoking status: Former Smoker    Packs/day: 1.00    Years: 8.00    Pack years: 8.00    Types: Cigarettes    Quit date: 11/21/1983    Years since quitting: 36.2  . Smokeless tobacco: Never Used  Substance and Sexual Activity  . Alcohol use: No  . Drug use: No  . Sexual activity: Not on file  Other Topics Concern  . Not on file  Social History Narrative  . Not on file   Social Determinants of Health   Financial Resource Strain:   . Difficulty of Paying Living Expenses:   Food Insecurity:   . Worried About Charity fundraiser in the Last Year:   . Arboriculturist in the Last Year:   Transportation Needs:   . Film/video editor (Medical):   Marland Kitchen Lack of Transportation (Non-Medical):   Physical Activity:   . Days of Exercise per Week:   . Minutes of Exercise per Session:   Stress:   . Feeling of Stress :   Social Connections:   . Frequency of Communication with Friends and Family:   . Frequency of Social Gatherings with Friends and Family:   . Attends Religious Services:   . Active Member of Clubs or Organizations:   . Attends Archivist Meetings:   Marland Kitchen Marital Status:   Intimate Partner Violence:   . Fear of Current or Ex-Partner:   . Emotionally Abused:   Marland Kitchen Physically Abused:   . Sexually Abused:     Current Outpatient Medications on File Prior to Visit  Medication Sig Dispense Refill  . acetaminophen (TYLENOL) 650 MG CR tablet Take 650 mg by mouth every morning.    Marland Kitchen albuterol  (PROVENTIL) (2.5 MG/3ML) 0.083% nebulizer solution Take 2.5 mg by nebulization every 6 (six) hours as needed for wheezing or shortness of breath.    Marland Kitchen albuterol (VENTOLIN HFA) 108 (90 Base) MCG/ACT inhaler Inhale 1-2 puffs into the lungs every 6 (six) hours as needed for wheezing or shortness of breath.    Marland Kitchen amLODipine (NORVASC) 10 MG tablet Take 10 mg by mouth daily.     Marland Kitchen aspirin EC 81 MG tablet Take 81 mg by mouth daily.    . Azelastine HCl 0.15 % SOLN Place 1 spray into both nostrils daily as needed for allergies.    . calcitRIOL (ROCALTROL) 0.25 MCG capsule Take 0.25 mcg by mouth daily.     Marland Kitchen  fluticasone furoate-vilanterol (BREO ELLIPTA) 200-25 MCG/INH AEPB Inhale 1 puff into the lungs daily as needed (respiratory issues.).     Marland Kitchen metoprolol tartrate (LOPRESSOR) 25 MG tablet Take 1 tablet (25 mg total) by mouth 2 (two) times daily. 60 tablet 11  . nitroGLYCERIN (NITROSTAT) 0.4 MG SL tablet Place 1 tablet (0.4 mg total) under the tongue every 5 (five) minutes as needed for chest pain. 25 tablet 1  . omeprazole (PRILOSEC) 20 MG capsule Take 20 mg by mouth daily after breakfast.     . oxyCODONE (OXY IR/ROXICODONE) 5 MG immediate release tablet Take 1-2 tablets (5-10 mg total) by mouth every 6 (six) hours as needed for moderate pain or severe pain (5 mg for moderate pain and 10 mg for severe pain). 30 tablet 0  . predniSONE (DELTASONE) 5 MG tablet Take 1 tablet (5 mg total) by mouth daily.    . rosuvastatin (CRESTOR) 10 MG tablet Take 1 tablet (10 mg total) by mouth daily. (Patient taking differently: Take 10 mg by mouth every evening. ) 90 tablet 3  . sennosides-docusate sodium (SENOKOT-S) 8.6-50 MG tablet Take 1 tablet by mouth daily as needed (constipation.).     Marland Kitchen tacrolimus (PROGRAF) 1 MG capsule Take 5 mg by mouth See admin instructions. Take 5 mg by mouth at 9 AM and 5 mg at 9 PM    . Tiotropium Bromide Monohydrate (SPIRIVA RESPIMAT) 1.25 MCG/ACT AERS Inhale 1 puff into the lungs daily.    .  colchicine 0.6 MG tablet Take 1 tablet (0.6 mg total) by mouth 2 (two) times daily for 15 days. 30 tablet 0   No current facility-administered medications on file prior to visit.    Allergies  Allergen Reactions  . Sodium Hypochlorite Shortness Of Breath  . Lactose Nausea And Vomiting  . Asa Arthritis Strength-Antacid [Aspirin Buffered] Nausea And Vomiting and Other (See Comments)    STOMACH BURNS, also  . Aspirin Nausea And Vomiting    Per pt. "can tolerate the enteric coated tablets".   . Atorvastatin Other (See Comments)    Patient's skin was skin was sensitive  . Banana Nausea And Vomiting  . Lactose Intolerance (Gi) Nausea And Vomiting  . Lisinopril Cough    Family History  Problem Relation Age of Onset  . Breast cancer Sister 55  . Prostate cancer Brother   . Lung cancer Brother   . Cancer Neg Hx     BP (!) 150/90   Pulse 80   Ht 5\' 3"  (1.6 m)   Wt 159 lb 9.6 oz (72.4 kg)   SpO2 100%   BMI 28.27 kg/m    Review of Systems No weight change.     Objective:   Physical Exam VITAL SIGNS:  See vs page GENERAL: no distress.  Has 02 on Pulses: dorsalis pedis intact bilat.   MSK: no deformity of the feet CV: no leg edema Skin:  no ulcer on the feet.  normal color and temp on the feet.  Burn at the dorsal aspect of the left foot, with few mm ulcer.  No drainage.   Neuro: sensation is intact to touch on the feet.   Ext: there is bilateral onychomycosis of the toenails.    Lab Results  Component Value Date   HGBA1C 6.1 (A) 02/02/2020    Lab Results  Component Value Date   CREATININE 1.11 (H) 12/23/2019   BUN 8 12/23/2019   NA 139 12/23/2019   K 3.5 12/23/2019   CL 102  12/23/2019   CO2 27 12/23/2019       Assessment & Plan:  HTN: is noted today Burn injury, new Type 2 DM, with renal transplant: stable off rx  Patient Instructions  Your blood pressure is slightly high today.  Please continue to follow this up with Dr Deterding.  check your blood sugar  once a week.  vary the time of day when you check, between before the 3 meals, and at bedtime.  also check if you have symptoms of your blood sugar being too high or too low.  please keep a record of the readings and bring it to your next appointment here (or you can bring the meter itself).  You can write it on any piece of paper.  please call us sooner if your blood sugar goes below 70, or if you have a lot of readings over 200.   No medication is needed for the diabetes now.  However, please call if the prednisone is increased.   Keep the burn on your foot covered with antibiotic ointment and large bandaids, until it heals.  Please call next week if it is not much better by then.   Please come back for a follow-up appointment in 4 months.

## 2020-02-11 ENCOUNTER — Ambulatory Visit: Payer: Medicare HMO | Attending: Internal Medicine

## 2020-02-11 DIAGNOSIS — Z23 Encounter for immunization: Secondary | ICD-10-CM

## 2020-02-11 NOTE — Progress Notes (Signed)
   Covid-19 Vaccination Clinic  Name:  [name]    MRN: [id number] DOB: 1941/10/20  02/11/2020  [name] was observed post Covid-19 immunization for 15 minutes without incident. She was provided with Vaccine Information Sheet and instruction to access the V-Safe system.   [name] was instructed to call 911 with any severe reactions post vaccine: Marland Kitchen Difficulty breathing  . Swelling of face and throat  . A fast heartbeat  . A bad rash all over body  . Dizziness and weakness   Immunizations Administered    Name Date Dose VIS Date Route   Pfizer COVID-19 Vaccine 02/11/2020 10:29 AM 0.3 mL 10/31/2019 Intramuscular   Manufacturer: [place]   Lot: [id number]   [place]: [id number]

## 2020-03-04 ENCOUNTER — Encounter: Payer: Self-pay | Admitting: Cardiovascular Disease

## 2020-03-04 ENCOUNTER — Ambulatory Visit: Payer: Medicare HMO | Admitting: Cardiovascular Disease

## 2020-03-04 ENCOUNTER — Other Ambulatory Visit: Payer: Self-pay

## 2020-03-04 VITALS — BP 124/62 | HR 74 | Ht 62.5 in | Wt 160.8 lb

## 2020-03-04 DIAGNOSIS — I2510 Atherosclerotic heart disease of native coronary artery without angina pectoris: Secondary | ICD-10-CM

## 2020-03-04 DIAGNOSIS — I10 Essential (primary) hypertension: Secondary | ICD-10-CM

## 2020-03-04 NOTE — Progress Notes (Signed)
Cardiology Office Note   Date:  03/04/2020   ID:  [name], DOB [birth year], MRN [id number]  PCP:  Chesley Noon, MD  Cardiologist:  Mertie Moores, MD   No chief complaint on file.  Problem list 1. CAD - CABG , 2001 2. ESRD - s/p renal transplant - Sept. 2012  3. GI bleed:  4. DM    [name] is a 79 y.o. female who presents  Today to follow-up shortness of breath. I saw her last April, 2015. Before that she had had some difficulties with shortness of breath. We thought that some of her symptoms were related to increased levels of Prograf.  Fortunately she is doing well. She is not having any chest pain or significant shortness of breath.  March 07, 2016:  Doing well.  Previous patient of Dr. Ron Parker. No issues.   Some DOE if she walks too far.  No Cp , has not had to take any NTG .  March 14, 2017:  Has had some dyspnea with exertion,  Seems to have worsened over the past month or so Has to stop and rest twice when walking to the mailbox   Has had some allergy issues Stays fatigued.   March 04, 2018:  [name] is seen back today for follow-up of her coronary artery disease. Been started on home oxygen.  She has history of chronic asthma end-stage renal disease, status post renal transplant Was in the hospital in March - was not D/C'd on home O2.  Has developed worsening dyspnea . O2 sats were in the 80s  No CP  O2 sat was 81% when she arrived in the office today  Came up to 90% with O2  2 liters / min   March 04, 2020:  [name] is seen back today for follow-up of her coronary artery disease.  She has a history of coronary artery bypass grafting in 2001.  She has end-stage renal disease and is s/p renal transplant about 9 years.   Avoids salt ,  Fried foods  She had lipids drawn at Dr. Fayrene Fearing office in February.  Her total cholesterol is 140.  The triglyceride level is 142.  The HDL is 54.  The LDL is 62.  Complete metabolic profile drawn at  that time reveals a creatinine of 1.57.  The sodium is 141.  The potassium is 4.6.  Liver enzymes look normal.   Past Medical History:  Diagnosis Date  . 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 Asthma   . Cellulitis 12/19/2019   right upper arm  . Coronary artery disease    cath 11/2007, grafts patent /  Nuclear, June, 2011, prior inferior MI with mild peri-infarct ischemia, anterior breast attenuation, EF 67%, done for renal transplant assessment / Low level exercise/Lexiscan Myoview (11/2013): EF 67%, no ischemi; normal study  . Diabetes mellitus    type 2  . ESRD on dialysis [facility])    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, 2012  . Family history of breast cancer   . Family history of prostate cancer   . GERD (gastroesophageal reflux disease)   . GI bleed 11/26/2010   January, 2012 , AVM  . Gout   . History of methicillin resistant staphylococcus aureus (MRSA)   . Hyperlipidemia   . Hyperparathyroidism   . Hypertension   . Myocardial infarction ([place])   .  Osteoporosis   . Pneumonia 08/2010    Hospitalization, October, 2011  . Primary osteoarthritis, 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  . S/P kidney transplant    September, 2012, [facility]  . Subacromial impingement of 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    Past Surgical History:  Procedure Laterality Date  . ABDOMINAL HYSTERECTOMY  1980'S  . ARTERIOVENOUS GRAFT PLACEMENT Left 03/04/2007   forearm  . BIOPSY  09/30/2019   Procedure: BIOPSY;  Surgeon: Wilford Corner, MD;  [facility];  Service: Endoscopy;;  . BREAST EXCISIONAL BIOPSY Right    benign more than 10 yr ago  . BREAST SURGERY  YRS AGO   RT BREAST CYST REMOVED   . CARDIAC CATHETERIZATION  06/03/2002; 12/04/2007  . COLONOSCOPY WITH PROPOFOL N/A 07/14/2014   Procedure: COLONOSCOPY WITH PROPOFOL;  Surgeon: Lear Ng, MD;  [facility];  Service: Endoscopy;  Laterality: N/A;  . COLONOSCOPY WITH  PROPOFOL N/A 09/30/2019   Procedure: COLONOSCOPY WITH PROPOFOL;  Surgeon: Wilford Corner, MD;  [facility];  Service: Endoscopy;  Laterality: N/A;  . CORONARY ARTERY BYPASS GRAFT  06/06/2002   x 4  . HOT HEMOSTASIS N/A 07/14/2014   Procedure: HOT HEMOSTASIS (ARGON PLASMA COAGULATION/BICAP);  Surgeon: Lear Ng, MD;  [facility];  Service: Endoscopy;  Laterality: N/A;  . IR FLUORO GUIDE CV LINE RIGHT  12/22/2019  . IR US GUIDE VASC ACCESS RIGHT  12/22/2019  . KIDNEY TRANSPLANT Right 08/04/2011  . ORIF PATELLA Left 04/06/2016   Procedure: OPEN REDUCTION INTERNAL (ORIF) LEFT  PATELLA;  Surgeon: Ninetta Lights, MD;  Location: [place];  Service: Orthopedics;  Laterality: Left;  . POLYPECTOMY  09/30/2019   Procedure: POLYPECTOMY;  Surgeon: Wilford Corner, MD;  [facility];  Service: Endoscopy;;  . SHOULDER ARTHROSCOPY WITH ROTATOR CUFF REPAIR AND SUBACROMIAL DECOMPRESSION Left 08/03/2016   Procedure: LEFT SHOULDER ARTHROSCOPY WITH ROTATOR CUFF REPAIR AND SUBACROMIAL DECOMPRESSION with distal claviculectomy and extentsive debridement;  Surgeon: Ninetta Lights, MD;  Location: [place];  Service: Orthopedics;  Laterality: Left;  Block  . THROMBECTOMY AND REVISION OF ARTERIOVENTOUS (AV) GORETEX  GRAFT Left 05/08/2007   forearm  . TOTAL HIP ARTHROPLASTY  12/18/2012   Procedure: TOTAL HIP ARTHROPLASTY;  Surgeon: Ninetta Lights, MD;  Location: [place];  Service: Orthopedics;  Laterality: Left;  Marland Kitchen VESICOVAGINAL FISTULA CLOSURE W/ TAH  1984    Patient Active Problem List   Diagnosis Date Noted  . Coronary artery disease     Priority: High  . Olecranon bursitis of right elbow 12/26/2019  . Right arm cellulitis 12/18/2019  . Fever   . Hx of colonic polyps 09/30/2019  . Family history of breast cancer   . Family history of prostate cancer   . Family history of lung cancer 08/08/2018  . Shoulder pain, right 04/04/2018  .  Severe persistent asthma with (acute) exacerbation 02/13/2018  . GERD (gastroesophageal reflux disease) 02/12/2018  . Severe persistent asthma with acute exacerbation 02/12/2018  . Complete rotator cuff tear of 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  . Subacromial impingement of 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  . Primary osteoarthritis, 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  . History of methicillin resistant staphylococcus aureus (MRSA)   . Benign neoplasm of colon 07/14/2014  . Nausea & vomiting 12/22/2013  . Dyspnea 12/22/2013  . Diabetes ([address]) 02/28/2013  . Preop cardiovascular exam   . Type II or unspecified type diabetes mellitus without mention of complication, not stated as uncontrolled 06/05/2012  . Encounter for long-term (current) use  of other medications 02/26/2012  . Type II or unspecified type diabetes mellitus with renal manifestations, not stated as uncontrolled(250.40) 02/26/2012  . S/P kidney transplant   . H/O steroid therapy   . Anemia   . Hyperlipidemia   . Hypertension   . Aspirin allergy   . Hx of CABG   . Ejection fraction   . Anemia   . GI bleed 11/26/2010  . Pneumonia 08/20/2010  . FOOT PAIN 01/20/2010  . THROMBOCYTOPENIA 01/11/2009  . 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 GOUT 08/12/2007  . Persistent asthma without complication 0000000  . MENOPAUSAL SYNDROME 08/12/2007  . Osteoporosis 08/12/2007  . VERTIGO 08/12/2007      Current Outpatient Medications  Medication Sig Dispense Refill  . acetaminophen (TYLENOL) 650 MG CR tablet Take 650 mg by mouth every morning.    Marland Kitchen albuterol (PROVENTIL) (2.5 MG/3ML) 0.083% nebulizer solution Take 2.5 mg by nebulization every 6 (six) hours as needed for wheezing or shortness of breath.    Marland Kitchen albuterol (VENTOLIN HFA) 108 (90 Base) MCG/ACT inhaler Inhale 1-2 puffs into the lungs every 6 (six) hours as needed for wheezing or shortness of breath.    Marland Kitchen amLODipine (NORVASC) 10 MG tablet Take 10 mg by mouth daily.     Marland Kitchen aspirin EC  81 MG tablet Take 81 mg by mouth daily.    . Azelastine HCl 0.15 % SOLN Place 1 spray into both nostrils daily as needed for allergies.    . budesonide (PULMICORT) 0.25 MG/2ML nebulizer solution Inhale 0.25 mg into the lungs as needed.    . calcitRIOL (ROCALTROL) 0.25 MCG capsule Take 0.25 mcg by mouth daily.     . colchicine 0.6 MG tablet Take 1 tablet (0.6 mg total) by mouth 2 (two) times daily for 15 days. 30 tablet 0  . fluticasone furoate-vilanterol (BREO ELLIPTA) 200-25 MCG/INH AEPB Inhale 1 puff into the lungs daily as needed (respiratory issues.).     Marland Kitchen metoprolol tartrate (LOPRESSOR) 25 MG tablet Take 1 tablet (25 mg total) by mouth 2 (two) times daily. 60 tablet 11  . nitroGLYCERIN (NITROSTAT) 0.4 MG SL tablet Place 1 tablet (0.4 mg total) under the tongue every 5 (five) minutes as needed for chest pain. 25 tablet 1  . omeprazole (PRILOSEC) 20 MG capsule Take 20 mg by mouth daily after breakfast.     . oxyCODONE (OXY IR/ROXICODONE) 5 MG immediate release tablet Take 1-2 tablets (5-10 mg total) by mouth every 6 (six) hours as needed for moderate pain or severe pain (5 mg for moderate pain and 10 mg for severe pain). 30 tablet 0  . predniSONE (DELTASONE) 5 MG tablet Take 5 mg by mouth 2 (two) times daily.    . rosuvastatin (CRESTOR) 10 MG tablet Take 1 tablet (10 mg total) by mouth daily. 90 tablet 3  . sennosides-docusate sodium (SENOKOT-S) 8.6-50 MG tablet Take 1 tablet by mouth daily as needed (constipation.).     Marland Kitchen tacrolimus (PROGRAF) 1 MG capsule Take 5 mg by mouth See admin instructions. Take 5 mg by mouth at 9 AM and 5 mg at 9 PM    . Tiotropium Bromide Monohydrate (SPIRIVA RESPIMAT) 1.25 MCG/ACT AERS Inhale 1 puff into the lungs daily.     No current facility-administered medications for this visit.    Allergies:   Sodium hypochlorite, Lactose, Asa arthritis strength-antacid [aspirin buffered], Aspirin, Atorvastatin, Banana, Lactose intolerance (gi), and Lisinopril    Social  History:  The patient  reports that she  quit smoking about 36 years ago. Her smoking use included cigarettes. She has a 8.00 pack-year smoking history. She has never used smokeless tobacco. She reports that she does not drink alcohol or use drugs.   Family History:  The patient's family history includes Breast cancer (age of onset: 54) in her sister; Lung cancer in her brother; Prostate cancer in her brother.    ROS:  Please see the history of present illness.      Patient denies fever, chills, headache, sweats, rash, change in vision, change in hearing, chest pain, cough, nausea or vomiting, urinary symptoms. All other systems are reviewed and are negative.    Physical Exam: Blood pressure 124/62, pulse 74, height 5' 2.5" (1.588 m), weight 160 lb 12 oz (72.9 kg), SpO2 99 %.  GEN:  Well nourished, well developed in no acute distress HEENT: Normal NECK: No JVD; No carotid bruits LYMPHATICS: No lymphadenopathy CARDIAC: RRR , no murmurs, rubs, gallops RESPIRATORY:  Clear to auscultation without rales, wheezing or rhonchi  ABDOMEN: Soft, non-tender, non-distended MUSCULOSKELETAL:  No edema; No deformity  SKIN: Warm and dry NEUROLOGIC:  Alert and oriented x 3   EKG:     Recent Labs: 12/18/2019: ALT 12 12/23/2019: BUN 8; Creatinine, Ser 1.11; Hemoglobin 11.4; Platelets 147; Potassium 3.5; Sodium 139 01/05/2020: NT-Pro BNP 292    Lipid Panel    Component Value Date/Time   CHOL 171 06/05/2018 0826   TRIG 125 06/05/2018 0826   HDL 71 06/05/2018 0826   CHOLHDL 2.4 06/05/2018 0826   CHOLHDL 2.6 02/14/2018 0223   VLDL 20 02/14/2018 0223   LDLCALC 75 06/05/2018 0826   LDLDIRECT 136.3 02/26/2012 1715      Wt Readings from Last 3 Encounters:  03/04/20 160 lb 12 oz (72.9 kg)  02/02/20 159 lb 9.6 oz (72.4 kg)  01/05/20 168 lb (76.2 kg)      Current medicines are reviewed   Patient understands her medications well.     ASSESSMENT AND PLAN:  1. CAD - CABG , 2001 -    Doing well,   Not having any chest pain   2. Shortness breath with exertion/fatigue:   Has asthma.      3. ESRD - s/p renal transplant.   She seems to be doing well.      4.  Hyperlipidemia:  Labs managed by Dr. Melford Aase.   Recent labs from Feb. 2021 look great.   5. DM  - managed by Dr. Rowe Pavy, MD  03/04/2020 10:18 AM    [address],  [address] [place], [place]  [address] Pager [phone] Phone: [phone]; Fax: [phone]

## 2020-03-04 NOTE — Patient Instructions (Signed)
Medication Instructions:  Your physician recommends that you continue on your current medications as directed. Please refer to the Current Medication list given to you today.  *If you need a refill on your cardiac medications before your next appointment, please call your pharmacy*   Lab Work: None Ordered If you have labs (blood work) drawn today and your tests are completely normal, you will receive your results only by: Marland Kitchen MyChart Message (if you have MyChart) OR . A paper copy in the mail If you have any lab test that is abnormal or we need to change your treatment, we will call you to review the results.   Testing/Procedures: None Ordered   Follow-Up: At [facility], you and your health needs are our priority.  As part of our continuing mission to provide you with exceptional heart care, we have created designated Provider Care Teams.  These Care Teams include your primary Cardiologist (physician) and Advanced Practice Providers (APPs -  Physician Assistants and Nurse Practitioners) who all work together to provide you with the care you need, when you need it.  We recommend signing up for the patient portal called "MyChart".  Sign up information is provided on this After Visit Summary.  MyChart is used to connect with patients for Virtual Visits (Telemedicine).  Patients are able to view lab/test results, encounter notes, upcoming appointments, etc.  Non-urgent messages can be sent to your provider as well.   To learn more about what you can do with MyChart, go to [email or web address].    Your next appointment:   1 year(s)  The format for your next appointment:   In Person  Provider:   Richardson Dopp, PA-C or Robbie Lis, PA-C

## 2020-03-16 ENCOUNTER — Other Ambulatory Visit: Payer: Self-pay | Admitting: Family Medicine

## 2020-03-16 DIAGNOSIS — Z1231 Encounter for screening mammogram for malignant neoplasm of breast: Secondary | ICD-10-CM

## 2020-03-17 ENCOUNTER — Other Ambulatory Visit: Payer: Self-pay | Admitting: Cardiovascular Disease

## 2020-04-26 ENCOUNTER — Other Ambulatory Visit: Payer: Self-pay

## 2020-04-26 ENCOUNTER — Ambulatory Visit
Admission: RE | Admit: 2020-04-26 | Discharge: 2020-04-26 | Disposition: A | Discharge status: Home / Self Care | Payer: Medicare HMO | Source: Ambulatory Visit | Attending: Family Medicine | Admitting: Family Medicine

## 2020-04-26 DIAGNOSIS — Z1231 Encounter for screening mammogram for malignant neoplasm of breast: Secondary | ICD-10-CM

## 2020-06-03 ENCOUNTER — Encounter: Payer: Self-pay | Admitting: Endocrinology

## 2020-06-03 ENCOUNTER — Other Ambulatory Visit: Payer: Self-pay

## 2020-06-03 ENCOUNTER — Ambulatory Visit (INDEPENDENT_AMBULATORY_CARE_PROVIDER_SITE_OTHER): Payer: Medicare HMO | Admitting: Endocrinology

## 2020-06-03 VITALS — BP 140/80 | HR 84 | Ht 63.0 in | Wt 162.0 lb

## 2020-06-03 DIAGNOSIS — Z794 Long term (current) use of insulin: Secondary | ICD-10-CM

## 2020-06-03 DIAGNOSIS — I10 Essential (primary) hypertension: Secondary | ICD-10-CM

## 2020-06-03 DIAGNOSIS — E1151 Type 2 diabetes mellitus with diabetic peripheral angiopathy without gangrene: Secondary | ICD-10-CM | POA: Diagnosis not present

## 2020-06-03 LAB — POCT GLYCOSYLATED HEMOGLOBIN (HGB A1C): Hemoglobin A1C: 6.1 % — AB (ref 4.0–5.6)

## 2020-06-03 NOTE — Progress Notes (Signed)
Subjective:    Patient ID: [name], female    DOB: [birth year], 79 y.o.   MRN: [id number]  HPI Pt returns for f/u of diabetes mellitus: DM type: 2 Dx'ed: 2751 Complications: CAD, PAD (seen on CT), and renal failure (transplant 2013).   Therapy: no medication now GDM: never DKA: never Severe hypoglycemia: never Pancreatitis: never Pancreatic imaging: normal on 2015 CT Other: she has never been on insulin; she intermittently takes steroids for COPD; she requests generic meds only.   Interval history: pt says cbg's are well-controlled.  pt states she feels well in general, except for chronic sob.  No recent change in low prednisone dosage.  Past Medical History:  Diagnosis Date  . 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 Asthma   . Cellulitis 12/19/2019   right upper arm  . Coronary artery disease    cath 11/2007, grafts patent /  Nuclear, June, 2011, prior inferior MI with mild peri-infarct ischemia, anterior breast attenuation, EF 67%, done for renal transplant assessment / Low level exercise/Lexiscan Myoview (11/2013): EF 67%, no ischemi; normal study  . Diabetes mellitus    type 2  . ESRD on dialysis [facility])    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, 2012  . Family history of breast cancer   . Family history of prostate cancer   . GERD (gastroesophageal reflux disease)   . GI bleed 11/26/2010   January, 2012 , AVM  . Gout   . History of methicillin resistant staphylococcus aureus (MRSA)   . Hyperlipidemia   . Hyperparathyroidism   . Hypertension   . Myocardial infarction ([place])   . Osteoporosis   . Pneumonia 08/2010    Hospitalization, October, 2011  . Primary osteoarthritis, 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  . S/P kidney transplant    September, 2012, [facility]  . Subacromial impingement of 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    Past Surgical History:  Procedure Laterality Date  . ABDOMINAL HYSTERECTOMY  1980'S  .  ARTERIOVENOUS GRAFT PLACEMENT Left 03/04/2007   forearm  . BIOPSY  09/30/2019   Procedure: BIOPSY;  Surgeon: Wilford Corner, MD;  Location: [facility];  Service: Endoscopy;;  . BREAST EXCISIONAL BIOPSY Right    benign more than 10 yr ago  . BREAST SURGERY  YRS AGO   RT BREAST CYST REMOVED   . CARDIAC CATHETERIZATION  06/03/2002; 12/04/2007  . COLONOSCOPY WITH PROPOFOL N/A 07/14/2014   Procedure: COLONOSCOPY WITH PROPOFOL;  Surgeon: Lear Ng, MD;  Location: [facility];  Service: Endoscopy;  Laterality: N/A;  . COLONOSCOPY WITH PROPOFOL N/A 09/30/2019   Procedure: COLONOSCOPY WITH PROPOFOL;  Surgeon: Wilford Corner, MD;  Location: [facility];  Service: Endoscopy;  Laterality: N/A;  . CORONARY ARTERY BYPASS GRAFT  06/06/2002   x 4  . HOT HEMOSTASIS N/A 07/14/2014   Procedure: HOT HEMOSTASIS (ARGON PLASMA COAGULATION/BICAP);  Surgeon: Lear Ng, MD;  Location: [facility];  Service: Endoscopy;  Laterality: N/A;  . IR FLUORO GUIDE CV LINE RIGHT  12/22/2019  . IR US GUIDE VASC ACCESS RIGHT  12/22/2019  . KIDNEY TRANSPLANT Right 08/04/2011  . ORIF PATELLA Left 04/06/2016   Procedure: OPEN REDUCTION INTERNAL (ORIF) LEFT  PATELLA;  Surgeon: Ninetta Lights, MD;  Location: [facility];  Service: Orthopedics;  Laterality: Left;  . POLYPECTOMY  09/30/2019   Procedure: POLYPECTOMY;  Surgeon: Wilford Corner, MD;  [facility];  Service: Endoscopy;;  .  SHOULDER ARTHROSCOPY WITH ROTATOR CUFF REPAIR AND SUBACROMIAL DECOMPRESSION Left 08/03/2016   Procedure: LEFT SHOULDER ARTHROSCOPY WITH ROTATOR CUFF REPAIR AND SUBACROMIAL DECOMPRESSION with distal claviculectomy and extentsive debridement;  Surgeon: Ninetta Lights, MD;  Location: [facility];  Service: Orthopedics;  Laterality: Left;  Block  . THROMBECTOMY AND REVISION OF ARTERIOVENTOUS (AV) GORETEX  GRAFT Left 05/08/2007   forearm  . TOTAL HIP ARTHROPLASTY  12/18/2012   Procedure: TOTAL  HIP ARTHROPLASTY;  Surgeon: Ninetta Lights, MD;  Location: [facility];  Service: Orthopedics;  Laterality: Left;  Marland Kitchen VESICOVAGINAL FISTULA CLOSURE W/ TAH  1984    Social History   Socioeconomic History  . Marital status: Divorced    Spouse name: Not on file  . Number of children: Not on file  . Years of education: Not on file  . Highest education level: Not on file  Occupational History  . Not on file  Tobacco Use  . Smoking status: Former Smoker    Packs/day: 1.00    Years: 8.00    Pack years: 8.00    Types: Cigarettes    Quit date: 11/21/1983    Years since quitting: 36.5  . Smokeless tobacco: Never Used  Vaping Use  . Vaping Use: Never used  Substance and Sexual Activity  . Alcohol use: No  . Drug use: No  . Sexual activity: Not on file  Other Topics Concern  . Not on file  Social History Narrative  . Not on file   Social Determinants of Health   Financial Resource Strain:   . Difficulty of Paying Living Expenses:   Food Insecurity:   . Worried About Charity fundraiser in the Last Year:   . Arboriculturist in the Last Year:   Transportation Needs:   . Film/video editor (Medical):   Marland Kitchen Lack of Transportation (Non-Medical):   Physical Activity:   . Days of Exercise per Week:   . Minutes of Exercise per Session:   Stress:   . Feeling of Stress :   Social Connections:   . Frequency of Communication with Friends and Family:   . Frequency of Social Gatherings with Friends and Family:   . Attends Religious Services:   . Active Member of Clubs or Organizations:   . Attends Archivist Meetings:   Marland Kitchen Marital Status:   Intimate Partner Violence:   . Fear of Current or Ex-Partner:   . Emotionally Abused:   Marland Kitchen Physically Abused:   . Sexually Abused:     Current Outpatient Medications on File Prior to Visit  Medication Sig Dispense Refill  . acetaminophen (TYLENOL) 650 MG CR tablet Take 650 mg by mouth every morning.    Marland Kitchen albuterol (PROVENTIL) (2.5 MG/3ML)  0.083% nebulizer solution Take 2.5 mg by nebulization every 6 (six) hours as needed for wheezing or shortness of breath.    Marland Kitchen albuterol (VENTOLIN HFA) 108 (90 Base) MCG/ACT inhaler Inhale 1-2 puffs into the lungs every 6 (six) hours as needed for wheezing or shortness of breath.    Marland Kitchen amLODipine (NORVASC) 10 MG tablet Take 10 mg by mouth daily.     Marland Kitchen aspirin EC 81 MG tablet Take 81 mg by mouth daily.    . Azelastine HCl 0.15 % SOLN Place 1 spray into both nostrils daily as needed for allergies.    . budesonide (PULMICORT) 0.25 MG/2ML nebulizer solution Inhale 0.25 mg into the lungs as needed.    . calcitRIOL (ROCALTROL) 0.25  MCG capsule Take 0.25 mcg by mouth daily.     . fluticasone furoate-vilanterol (BREO ELLIPTA) 200-25 MCG/INH AEPB Inhale 1 puff into the lungs daily as needed (respiratory issues.).     Marland Kitchen metoprolol tartrate (LOPRESSOR) 25 MG tablet Take 1 tablet (25 mg total) by mouth 2 (two) times daily. 60 tablet 11  . nitroGLYCERIN (NITROSTAT) 0.4 MG SL tablet Place 1 tablet (0.4 mg total) under the tongue every 5 (five) minutes as needed for chest pain. 25 tablet 1  . omeprazole (PRILOSEC) 20 MG capsule Take 20 mg by mouth daily after breakfast.     . oxyCODONE (OXY IR/ROXICODONE) 5 MG immediate release tablet Take 1-2 tablets (5-10 mg total) by mouth every 6 (six) hours as needed for moderate pain or severe pain (5 mg for moderate pain and 10 mg for severe pain). 30 tablet 0  . predniSONE (DELTASONE) 5 MG tablet Take 5 mg by mouth 2 (two) times daily.    . rosuvastatin (CRESTOR) 10 MG tablet TAKE 1 TABLET BY MOUTH EVERY DAY 90 tablet 3  . sennosides-docusate sodium (SENOKOT-S) 8.6-50 MG tablet Take 1 tablet by mouth daily as needed (constipation.).     Marland Kitchen tacrolimus (PROGRAF) 1 MG capsule Take 5 mg by mouth See admin instructions. Take 5 mg by mouth at 9 AM and 5 mg at 9 PM    . Tiotropium Bromide Monohydrate (SPIRIVA RESPIMAT) 1.25 MCG/ACT AERS Inhale 1 puff into the lungs daily.    .  colchicine 0.6 MG tablet Take 1 tablet (0.6 mg total) by mouth 2 (two) times daily for 15 days. 30 tablet 0   No current facility-administered medications on file prior to visit.    Allergies  Allergen Reactions  . Sodium Hypochlorite Shortness Of Breath  . Lactose Nausea And Vomiting  . Asa Arthritis Strength-Antacid [Aspirin Buffered] Nausea And Vomiting and Other (See Comments)    STOMACH BURNS, also  . Aspirin Nausea And Vomiting    Per pt. "can tolerate the enteric coated tablets".   . Atorvastatin Other (See Comments)    Patient's skin was skin was sensitive  . Banana Nausea And Vomiting  . Lactose Intolerance (Gi) Nausea And Vomiting  . Lisinopril Cough    Family History  Problem Relation Age of Onset  . Breast cancer Sister 74  . Prostate cancer Brother   . Lung cancer Brother   . Cancer Neg Hx     BP 140/80   Pulse 84   Ht 5\' 3"  (1.6 m)   Wt 162 lb (73.5 kg)   SpO2 99%   BMI 28.70 kg/m    Review of Systems     Objective:   Physical Exam VITAL SIGNS:  See vs page GENERAL: no distress.  Has 02 on.   Pulses: dorsalis pedis intact bilat.   MSK: no deformity of the feet CV: no leg edema Skin:  no ulcer on the feet.  normal color and temp on the feet. Neuro: sensation is intact to touch on the feet Ext: there is bilateral onychomycosis of the toenails  Lab Results  Component Value Date   HGBA1C 6.1 (A) 06/03/2020   Lab Results  Component Value Date   CREATININE 1.11 (H) 12/23/2019   BUN 8 12/23/2019   NA 139 12/23/2019   K 3.5 12/23/2019   CL 102 12/23/2019   CO2 27 12/23/2019        Assessment & Plan:  HTN: is noted today Type 2 DM, with PAD: stable off  medication.  Patient Instructions  Your blood pressure is slightly high today.  Please continue to follow this up with Dr Deterding.  check your blood sugar once a week.  vary the time of day when you check, between before the 3 meals, and at bedtime.  also check if you have symptoms of your  blood sugar being too high or too low.  please keep a record of the readings and bring it to your next appointment here (or you can bring the meter itself).  You can write it on any piece of paper.  please call us sooner if your blood sugar goes below 70, or if you have a lot of readings over 200.   No medication is needed for the diabetes now.  However, please call if the prednisone is increased.   Please come back for a follow-up appointment in 4-5 months.

## 2020-06-03 NOTE — Patient Instructions (Addendum)
Your blood pressure is slightly high today.  Please continue to follow this up with Dr Deterding.  check your blood sugar once a week.  vary the time of day when you check, between before the 3 meals, and at bedtime.  also check if you have symptoms of your blood sugar being too high or too low.  please keep a record of the readings and bring it to your next appointment here (or you can bring the meter itself).  You can write it on any piece of paper.  please call us sooner if your blood sugar goes below 70, or if you have a lot of readings over 200.   No medication is needed for the diabetes now.  However, please call if the prednisone is increased.   Please come back for a follow-up appointment in 4-5 months.

## 2020-11-04 ENCOUNTER — Ambulatory Visit (INDEPENDENT_AMBULATORY_CARE_PROVIDER_SITE_OTHER): Payer: Medicare HMO | Admitting: Endocrinology

## 2020-11-04 ENCOUNTER — Encounter: Payer: Self-pay | Admitting: Endocrinology

## 2020-11-04 ENCOUNTER — Other Ambulatory Visit: Payer: Self-pay

## 2020-11-04 VITALS — BP 120/88 | HR 88 | Ht 62.0 in | Wt 162.0 lb

## 2020-11-04 DIAGNOSIS — E1122 Type 2 diabetes mellitus with diabetic chronic kidney disease: Secondary | ICD-10-CM

## 2020-11-04 DIAGNOSIS — N1831 Chronic kidney disease, stage 3a: Secondary | ICD-10-CM | POA: Diagnosis not present

## 2020-11-04 DIAGNOSIS — Z794 Long term (current) use of insulin: Secondary | ICD-10-CM | POA: Diagnosis not present

## 2020-11-04 LAB — POCT GLYCOSYLATED HEMOGLOBIN (HGB A1C): Hemoglobin A1C: 6.1 % — AB (ref 4.0–5.6)

## 2020-11-04 NOTE — Patient Instructions (Addendum)
check your blood sugar once a week.  vary the time of day when you check, between before the 3 meals, and at bedtime.  also check if you have symptoms of your blood sugar being too high or too low.  please keep a record of the readings and bring it to your next appointment here (or you can bring the meter itself).  You can write it on any piece of paper.  please call us sooner if your blood sugar goes below 70, or if you have a lot of readings over 200.   No medication is needed for the diabetes now.  However, please call if the prednisone is increased.   Please come back for a follow-up appointment in 6 months.

## 2020-11-04 NOTE — Progress Notes (Signed)
Subjective:    Patient ID: [name], female    DOB: [birth year], 79 y.o.   MRN: [id number]  HPI Pt returns for f/u of diabetes mellitus: DM type: 2 Dx'ed: 2751 Complications: CAD, PAD (seen on CT), and renal failure (transplant 2013).   Therapy: no medication now GDM: never DKA: never Severe hypoglycemia: never Pancreatitis: never Pancreatic imaging: normal on 2015 CT Other: she has never been on insulin; she intermittently takes steroids for COPD; she requests generic meds only.   Interval history: pt says cbg's are well-controlled.  pt states she feels well in general, except for chronic sob.  No recent change in low prednisone dosage.  Past Medical History:  Diagnosis Date  . 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 Asthma   . Cellulitis 12/19/2019   right upper arm  . Coronary artery disease    cath 11/2007, grafts patent /  Nuclear, June, 2011, prior inferior MI with mild peri-infarct ischemia, anterior breast attenuation, EF 67%, done for renal transplant assessment / Low level exercise/Lexiscan Myoview (11/2013): EF 67%, no ischemi; normal study  . Diabetes mellitus    type 2  . ESRD on dialysis [facility])    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, 2012  . Family history of breast cancer   . Family history of prostate cancer   . GERD (gastroesophageal reflux disease)   . GI bleed 11/26/2010   January, 2012 , AVM  . Gout   . History of methicillin resistant staphylococcus aureus (MRSA)   . Hyperlipidemia   . Hyperparathyroidism   . Hypertension   . Myocardial infarction ([place])   . Osteoporosis   . Pneumonia 08/2010    Hospitalization, October, 2011  . Primary osteoarthritis, 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  . S/P kidney transplant    September, 2012, [facility]  . Subacromial impingement of 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    Past Surgical History:  Procedure Laterality Date  . ABDOMINAL HYSTERECTOMY  1980'S  .  ARTERIOVENOUS GRAFT PLACEMENT Left 03/04/2007   forearm  . BIOPSY  09/30/2019   Procedure: BIOPSY;  Surgeon: Wilford Corner, MD;  Location: [facility];  Service: Endoscopy;;  . BREAST EXCISIONAL BIOPSY Right    benign more than 10 yr ago  . BREAST SURGERY  YRS AGO   RT BREAST CYST REMOVED   . CARDIAC CATHETERIZATION  06/03/2002; 12/04/2007  . COLONOSCOPY WITH PROPOFOL N/A 07/14/2014   Procedure: COLONOSCOPY WITH PROPOFOL;  Surgeon: Lear Ng, MD;  Location: [facility];  Service: Endoscopy;  Laterality: N/A;  . COLONOSCOPY WITH PROPOFOL N/A 09/30/2019   Procedure: COLONOSCOPY WITH PROPOFOL;  Surgeon: Wilford Corner, MD;  [facility];  Service: Endoscopy;  Laterality: N/A;  . CORONARY ARTERY BYPASS GRAFT  06/06/2002   x 4  . HOT HEMOSTASIS N/A 07/14/2014   Procedure: HOT HEMOSTASIS (ARGON PLASMA COAGULATION/BICAP);  Surgeon: Lear Ng, MD;  Location: [facility];  Service: Endoscopy;  Laterality: N/A;  . IR FLUORO GUIDE CV LINE RIGHT  12/22/2019  . IR US GUIDE VASC ACCESS RIGHT  12/22/2019  . KIDNEY TRANSPLANT Right 08/04/2011  . ORIF PATELLA Left 04/06/2016   Procedure: OPEN REDUCTION INTERNAL (ORIF) LEFT  PATELLA;  Surgeon: Ninetta Lights, MD;  Location: [facility];  Service: Orthopedics;  Laterality: Left;  . POLYPECTOMY  09/30/2019   Procedure: POLYPECTOMY;  Surgeon: Wilford Corner, MD;  [facility];  Service: Endoscopy;;  .  SHOULDER ARTHROSCOPY WITH ROTATOR CUFF REPAIR AND SUBACROMIAL DECOMPRESSION Left 08/03/2016   Procedure: LEFT SHOULDER ARTHROSCOPY WITH ROTATOR CUFF REPAIR AND SUBACROMIAL DECOMPRESSION with distal claviculectomy and extentsive debridement;  Surgeon: Ninetta Lights, MD;  Location: [facility];  Service: Orthopedics;  Laterality: Left;  Block  . THROMBECTOMY AND REVISION OF ARTERIOVENTOUS (AV) GORETEX  GRAFT Left 05/08/2007   forearm  . TOTAL HIP ARTHROPLASTY  12/18/2012   Procedure: TOTAL  HIP ARTHROPLASTY;  Surgeon: Ninetta Lights, MD;  Location: [facility];  Service: Orthopedics;  Laterality: Left;  Marland Kitchen VESICOVAGINAL FISTULA CLOSURE W/ TAH  1984    Social History   Socioeconomic History  . Marital status: Divorced    Spouse name: Not on file  . Number of children: Not on file  . Years of education: Not on file  . Highest education level: Not on file  Occupational History  . Not on file  Tobacco Use  . Smoking status: Former Smoker    Packs/day: 1.00    Years: 8.00    Pack years: 8.00    Types: Cigarettes    Quit date: 11/21/1983    Years since quitting: 36.9  . Smokeless tobacco: Never Used  Vaping Use  . Vaping Use: Never used  Substance and Sexual Activity  . Alcohol use: No  . Drug use: No  . Sexual activity: Not on file  Other Topics Concern  . Not on file  Social History Narrative  . Not on file   Social Determinants of Health   Financial Resource Strain: Not on file  Food Insecurity: Not on file  Transportation Needs: Not on file  Physical Activity: Not on file  Stress: Not on file  Social Connections: Not on file  Intimate Partner Violence: Not on file    Current Outpatient Medications on File Prior to Visit  Medication Sig Dispense Refill  . acetaminophen (TYLENOL) 650 MG CR tablet Take 650 mg by mouth every morning.    Marland Kitchen albuterol (PROVENTIL) (2.5 MG/3ML) 0.083% nebulizer solution Take 2.5 mg by nebulization every 6 (six) hours as needed for wheezing or shortness of breath.    Marland Kitchen albuterol (VENTOLIN HFA) 108 (90 Base) MCG/ACT inhaler Inhale 1-2 puffs into the lungs every 6 (six) hours as needed for wheezing or shortness of breath.    Marland Kitchen amLODipine (NORVASC) 10 MG tablet Take 10 mg by mouth daily.     Marland Kitchen aspirin EC 81 MG tablet Take 81 mg by mouth daily.    . Azelastine HCl 0.15 % SOLN Place 1 spray into both nostrils daily as needed for allergies.    . budesonide (PULMICORT) 0.25 MG/2ML nebulizer solution Inhale 0.25 mg into the lungs as needed.     . calcitRIOL (ROCALTROL) 0.25 MCG capsule Take 0.25 mcg by mouth daily.     . fluticasone furoate-vilanterol (BREO ELLIPTA) 200-25 MCG/INH AEPB Inhale 1 puff into the lungs daily as needed (respiratory issues.).     Marland Kitchen metoprolol tartrate (LOPRESSOR) 25 MG tablet Take 1 tablet (25 mg total) by mouth 2 (two) times daily. 60 tablet 11  . nitroGLYCERIN (NITROSTAT) 0.4 MG SL tablet Place 1 tablet (0.4 mg total) under the tongue every 5 (five) minutes as needed for chest pain. 25 tablet 1  . omeprazole (PRILOSEC) 20 MG capsule Take 20 mg by mouth daily after breakfast.    . predniSONE (DELTASONE) 5 MG tablet Take 5 mg by mouth 2 (two) times daily.    . rosuvastatin (CRESTOR) 10 MG  tablet TAKE 1 TABLET BY MOUTH EVERY DAY 90 tablet 3  . sennosides-docusate sodium (SENOKOT-S) 8.6-50 MG tablet Take 1 tablet by mouth daily as needed (constipation.).     Marland Kitchen tacrolimus (PROGRAF) 1 MG capsule Take 5 mg by mouth See admin instructions. Take 5 mg by mouth at 9 AM and 5 mg at 9 PM    . Tiotropium Bromide Monohydrate 1.25 MCG/ACT AERS Inhale 1 puff into the lungs daily.     No current facility-administered medications on file prior to visit.    Allergies  Allergen Reactions  . Sodium Hypochlorite Shortness Of Breath  . Lactose Nausea And Vomiting  . Asa Arthritis Strength-Antacid [Aspirin Buffered] Nausea And Vomiting and Other (See Comments)    STOMACH BURNS, also  . Aspirin Nausea And Vomiting    Per pt. "can tolerate the enteric coated tablets".   . Atorvastatin Other (See Comments)    Patient's skin was skin was sensitive  . Banana Nausea And Vomiting  . Lactose Intolerance (Gi) Nausea And Vomiting  . Lisinopril Cough    Family History  Problem Relation Age of Onset  . Breast cancer Sister 75  . Prostate cancer Brother   . Lung cancer Brother   . Cancer Neg Hx     BP 120/88   Pulse 88   Ht 5\' 2"  (1.575 m)   Wt 162 lb (73.5 kg)   SpO2 95%   BMI 29.63 kg/m    Review of Systems No  weight change.      Objective:   Physical Exam VITAL SIGNS:  See vs page GENERAL: no distress.  Has 02 on.   Pulses: dorsalis pedis intact bilat.   MSK: no deformity of the feet CV: no leg edema Skin:  no ulcer on the feet.  normal color and temp on the feet. Neuro: sensation is intact to touch on the feet.   Ext: there is bilateral onychomycosis of the toenails.    A1c=6.1%     Assessment & Plan:  Type 2 DM: stable off rx COPD: we discussed side effect of steroids on glucose.  Patient Instructions  check your blood sugar once a week.  vary the time of day when you check, between before the 3 meals, and at bedtime.  also check if you have symptoms of your blood sugar being too high or too low.  please keep a record of the readings and bring it to your next appointment here (or you can bring the meter itself).  You can write it on any piece of paper.  please call us sooner if your blood sugar goes below 70, or if you have a lot of readings over 200.   No medication is needed for the diabetes now.  However, please call if the prednisone is increased.   Please come back for a follow-up appointment in 6 months.

## 2021-01-06 ENCOUNTER — Other Ambulatory Visit: Payer: Self-pay | Admitting: Physician Assistant

## 2021-02-27 NOTE — Progress Notes (Addendum)
Cardiology Office Note:    Date:  02/28/2021   ID:  [name], DOB Aug 14, 1941, MRN [id number]  PCP:  Chesley Noon, MD   [place]  Cardiologist:  Mertie Moores, MD  Electrophysiologist:  None       Referring MD: Chesley Noon, MD   Chief Complaint:  Follow-up (CAD) and Chest Pain    Patient Profile:     [name] is a 80 y.o. female with:   Coronary artery disease   S/p CABG in 2001  Echocardiogram 3/19: EF 60-65  Myoview 5/18: Low risk   Diabetes mellitus   Hypertension   Hyperlipidemia   ESRD s/p Renal Transplant in 2012  Hx of GI bleed (AVMs)  Gout   Aortic atherosclerosis   Asthma   Prior CV studies: Echocardiogram 02/15/18 Mod LVH, EF 60-65, no RWMA, Gr 1 DD, AV sclerosis w/o AS, trivial MR,  mild RAE  Echocardiogram 03/22/17 EF 60-65, mild pulmonary hypertension (PASP 41)  GATED SPECT MYO PERF W/LEXISCAN STRESS 1D 03/22/2017 Narrative  Nuclear stress EF: 76%.  Inferior/inferolateral thinning with improvement in the inferolateral region (mid/distal) consistent with soft tissue attenuation and small region of ischemia.  This is a low risk study.      History of Present Illness:    Ms. [name] was last seen by Dr. Acie Fredrickson in 4/21.  She returns for f/u.  She is here alone.  She has chronic shortness of breath related to her COPD/asthma.  However, over the last several weeks, she feels her breathing has gotten worse.  She is short of breath with just minimal activity.  She also notes chest pressure/tightness with activity.  She feels some ache in her jaw at times.  She has not had orthopnea, significant leg edema or syncope.  She has not tried nitroglycerin for her symptoms.         Past Medical History:  Diagnosis Date  . 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 Asthma   . Cellulitis 12/19/2019   right upper arm  .  Coronary artery disease    cath 11/2007, grafts patent /  Nuclear, June, 2011, prior inferior MI with mild peri-infarct ischemia, anterior breast attenuation, EF 67%, done for renal transplant assessment / Low level exercise/Lexiscan Myoview (11/2013): EF 67%, no ischemi; normal study  . Diabetes mellitus    type 2  . ESRD on dialysis [facility])    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, 2012  . Family history of breast cancer   . Family history of prostate cancer   . GERD (gastroesophageal reflux disease)   . GI bleed 11/26/2010   January, 2012 , AVM  . Gout   . History of methicillin resistant staphylococcus aureus (MRSA)   . Hyperlipidemia   . Hyperparathyroidism   . Hypertension   . Myocardial infarction ([place])   . Osteoporosis   . Pneumonia 08/2010    Hospitalization, October, 2011  . Primary osteoarthritis, 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  . S/P kidney transplant    September, 2012, [facility]  . Subacromial impingement of 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    Current Medications: Current Meds  Medication Sig  . acetaminophen (TYLENOL) 650 MG CR tablet Take 650 mg by mouth every morning.  Marland Kitchen albuterol (PROVENTIL) (2.5 MG/3ML) 0.083% nebulizer solution Take 2.5 mg by nebulization every 6 (six) hours as needed for wheezing or shortness of breath.  Marland Kitchen  albuterol (VENTOLIN HFA) 108 (90 Base) MCG/ACT inhaler Inhale 1-2 puffs into the lungs every 6 (six) hours as needed for wheezing or shortness of breath.  Marland Kitchen amLODipine (NORVASC) 10 MG tablet Take 10 mg by mouth daily.   Marland Kitchen aspirin EC 81 MG tablet Take 81 mg by mouth daily.  . Azelastine HCl 0.15 % SOLN Place 1 spray into both nostrils daily as needed for allergies.  . budesonide (PULMICORT) 0.25 MG/2ML nebulizer solution Inhale 0.25 mg into the lungs as needed.  . calcitRIOL (ROCALTROL) 0.25 MCG capsule Take 0.25 mcg by mouth daily.   . fluticasone furoate-vilanterol (BREO ELLIPTA) 200-25 MCG/INH AEPB Inhale 1 puff into the lungs daily as needed (respiratory issues.).   Marland Kitchen  isosorbide mononitrate (IMDUR) 30 MG 24 hr tablet Take 1 tablet (30 mg total) by mouth daily.  . metoprolol tartrate (LOPRESSOR) 25 MG tablet TAKE 1 TABLET BY MOUTH TWICE A DAY  . nitroGLYCERIN (NITROSTAT) 0.4 MG SL tablet Place 1 tablet (0.4 mg total) under the tongue every 5 (five) minutes as needed for chest pain.  Marland Kitchen omeprazole (PRILOSEC) 20 MG capsule Take 20 mg by mouth daily after breakfast.  . predniSONE (DELTASONE) 5 MG tablet Take 5 mg by mouth 2 (two) times daily.  . rosuvastatin (CRESTOR) 10 MG tablet TAKE 1 TABLET BY MOUTH EVERY DAY  . sennosides-docusate sodium (SENOKOT-S) 8.6-50 MG tablet Take 1 tablet by mouth daily as needed (constipation.).   Marland Kitchen tacrolimus (PROGRAF) 1 MG capsule Take 5 mg by mouth See admin instructions. Take 5 mg by mouth at 9 AM and 5 mg at 9 PM  . Tiotropium Bromide Monohydrate 1.25 MCG/ACT AERS Inhale 1 puff into the lungs daily.     Allergies:   Sodium hypochlorite, Lactose, Asa arthritis strength-antacid [aspirin buffered], Aspirin, Atorvastatin, Banana, Lactose intolerance (gi), and Lisinopril   Social History   Tobacco Use  . Smoking status: Former Smoker    Packs/day: 1.00    Years: 8.00    Pack years: 8.00    Types: Cigarettes    Quit date: 11/21/1983    Years since quitting: 37.2  . Smokeless tobacco: Never Used  Vaping Use  . Vaping Use: Never used  Substance Use Topics  . Alcohol use: No  . Drug use: No     Family Hx: The patient's family history includes Breast cancer (age of onset: 36) in her sister; Lung cancer in her brother; Prostate cancer in her brother. There is no history of Cancer.  ROS   EKGs/Labs/Other Test Reviewed:    EKG:  EKG is   ordered today.  The ekg ordered today demonstrates normal sinus rhythm, heart rate 85, normal axis, no ST-T wave changes, QTC 452  Recent Labs: No results found for requested labs within last 8760 hours.   Recent Lipid Panel Lab Results  Component Value Date/Time   CHOL 171 06/05/2018  08:26 AM   TRIG 125 06/05/2018 08:26 AM   HDL 71 06/05/2018 08:26 AM   CHOLHDL 2.4 06/05/2018 08:26 AM   CHOLHDL 2.6 02/14/2018 02:23 AM   LDLCALC 75 06/05/2018 08:26 AM   LDLDIRECT 136.3 02/26/2012 05:15 PM      Risk Assessment/Calculations:      Physical Exam:    VS:  BP (!) 150/60   Pulse 85   Ht '5\' 2"'  (1.575 m)   Wt 163 lb 9.6 oz (74.2 kg)   SpO2 98%   BMI 29.92 kg/m     Wt Readings from Last 3 Encounters:  02/28/21 163 lb 9.6 oz (74.2 kg)  11/04/20 162 lb (73.5 kg)  06/03/20 162 lb (73.5 kg)     Constitutional:      Appearance: Healthy appearance. Not in distress.  Neck:     Vascular: No JVR. JVD normal.  Pulmonary:     Breath sounds: No wheezing. No rales.  Cardiovascular:     Normal rate. Regular rhythm. Normal S1. Normal S2.     Murmurs: There is no murmur.  Edema:    Peripheral edema absent.  Abdominal:     Palpations: Abdomen is soft. There is no hepatomegaly.  Skin:    General: Skin is warm and dry.  Neurological:     Mental Status: Alert and oriented to person, place and time.     Cranial Nerves: Cranial nerves are intact.         ASSESSMENT & PLAN:    1. Coronary artery disease involving native coronary artery of native heart without angina pectoris 2. Precordial pain Status post CABG in 2001.  Myoview in 2018 was low risk.  She has chronic shortness of breath related to her COPD/asthma.  However, recently it seems to have gotten worse.  She also notes chest discomfort with activities.  We discussed further testing to include cardiac catheterization versus stress test.  Given her prior history of renal transplant, I think it would be best to try to avoid cardiac catheterization if possible. She would also like to avoid cardiac catheterization.  Therefore, after further discussion, we decided to pursue medical therapy along with stress testing.  -Arrange Lexiscan Myoview  -Start isosorbide 30 mg daily  -CMET, CBC, BNP  -Continue current dose of  amlodipine, aspirin, metoprolol tartrate, rosuvastatin  -Follow-up 2-3 weeks  3. Essential hypertension Blood pressure above target.  Continue current dose of amlodipine, metoprolol tartrate.  Add isosorbide as noted.  4. Hyperlipidemia LDL goal <70 LDL in 5/21 was 111.  We discussed trying to maintain a goal of <70.  Obtain fasting CMET, lipids today.  5. S/P kidney transplant Most recent creatinine was 1.2  6. Chronic obstructive pulmonary disease, unspecified COPD type ([place]) She is on chronic O2.  She is not having active wheezing today.  Her symptoms may all be explained by COPD.  However, given her exertional chest symptoms we will proceed with stress testing as outlined above.  If her stress test is low risk, she may need to follow-up with pulmonology for further management.    Shared Decision Making/Informed Consent The risks [chest pain, shortness of breath, cardiac arrhythmias, dizziness, blood pressure fluctuations, myocardial infarction, stroke/transient ischemic attack, nausea, vomiting, allergic reaction, radiation exposure, metallic taste sensation and life-threatening complications (estimated to be 1 in 10,000)], benefits (risk stratification, diagnosing coronary artery disease, treatment guidance) and alternatives of a nuclear stress test were discussed in detail with Ms. Leichter and she agrees to proceed.     Dispo:  Return in about 2 weeks (around 03/14/2021) for Close Follow Up, w/ Dr. Acie Fredrickson, or Richardson Dopp, PA-C, in person.   Medication Adjustments/Labs and Tests Ordered: Current medicines are reviewed at length with the patient today.  Concerns regarding medicines are outlined above.  Tests Ordered: Orders Placed This Encounter  Procedures  . Comp Met (CMET)  . Lipid Profile  . Pro b natriuretic peptide  . CBC  . Myocardial Perfusion Imaging  . EKG 12-Lead   Medication Changes: Meds ordered this encounter  Medications  . isosorbide mononitrate (IMDUR) 30 MG  24 hr tablet  Sig: Take 1 tablet (30 mg total) by mouth daily.    Dispense:  30 tablet    Refill:  [address], Richardson Dopp, [place]  02/28/2021 4:27 PM    [facility] [place], [place],   [address] Phone: [phone]; Fax: [phone]

## 2021-02-28 ENCOUNTER — Encounter (INDEPENDENT_AMBULATORY_CARE_PROVIDER_SITE_OTHER): Payer: Self-pay

## 2021-02-28 ENCOUNTER — Other Ambulatory Visit: Payer: Self-pay

## 2021-02-28 ENCOUNTER — Encounter: Payer: Self-pay | Admitting: Physician Assistant

## 2021-02-28 ENCOUNTER — Ambulatory Visit: Payer: Medicare HMO | Admitting: Physician Assistant

## 2021-02-28 VITALS — BP 150/60 | HR 85 | Ht 62.0 in | Wt 163.6 lb

## 2021-02-28 DIAGNOSIS — R072 Precordial pain: Secondary | ICD-10-CM | POA: Diagnosis not present

## 2021-02-28 DIAGNOSIS — Z940 Kidney transplant status: Secondary | ICD-10-CM

## 2021-02-28 DIAGNOSIS — E785 Hyperlipidemia, unspecified: Secondary | ICD-10-CM

## 2021-02-28 DIAGNOSIS — I2510 Atherosclerotic heart disease of native coronary artery without angina pectoris: Secondary | ICD-10-CM

## 2021-02-28 DIAGNOSIS — I10 Essential (primary) hypertension: Secondary | ICD-10-CM | POA: Diagnosis not present

## 2021-02-28 DIAGNOSIS — J449 Chronic obstructive pulmonary disease, unspecified: Secondary | ICD-10-CM

## 2021-02-28 MED ORDER — ISOSORBIDE MONONITRATE ER 30 MG PO TB24: 30.0000 mg | ORAL_TABLET | Freq: Every day | ORAL | 11 refills | Status: DC

## 2021-02-28 NOTE — Patient Instructions (Signed)
Medication Instructions:  Your physician has recommended you make the following change in your medication:   1. Start Imdur one tablet by mouth ( 30 mg) daily, sent in # 30 to requested pharmacy.   *If you need a refill on your cardiac medications before your next appointment, please call your pharmacy*   Lab Work: Your physician recommends that you have lab work today; CMET/CBC/PRO BNP/TSH  If you have labs (blood work) drawn today and your tests are completely normal, you will receive your results only by: Marland Kitchen MyChart Message (if you have MyChart) OR . A paper copy in the mail If you have any lab test that is abnormal or we need to change your treatment, we will call you to review the results.   Testing/Procedures: You are scheduled for a Myocardial Perfusion Imaging Study on Wednesday, April 20 at 7:30 am.   Please arrive 15 minutes prior to your appointment time for registration and insurance purposes.   The test will take approximately 3 to 4 hours to complete; you may bring reading material. If someone comes with you to your appointment, they will need to remain in the main lobby due to limited space in the testing area.   How to prepare for your Myocardial Perfusion test:   Do not eat or drink 3 hours prior to your test, except you may have water.    Do not consume products containing caffeine (regular or decaffeinated) 12 hours prior to your test (ex: coffee, chocolate, soda, tea)   Do bring a list of your current medications with you. If not listed below, you may take your medications as normal.    Bring any held medication to your appointment, as you may be required to take it once the test is complete.   Do wear comfortable clothes (no dresses or overalls) and walking shoes. Tennis shoes are preferred. No heels or open toed shoes.  Do not wear cologne, perfume, aftershave or lotions (deodorant is allowed).   If these instructions are not followed, you test will have to  be rescheduled.   Please report to [address] for your test. If you have questions or concerns about your appointment, please call the Nuclear Lab at [phone].  If you cannot keep your appointment, please provide 24 hour notification to the Nuclear lab to avoid a possible $50 charge to your account.        Follow-Up: At [facility], you and your health needs are our priority.  As part of our continuing mission to provide you with exceptional heart care, we have created designated Provider Care Teams.  These Care Teams include your primary Cardiologist (physician) and Advanced Practice Providers (APPs -  Physician Assistants and Nurse Practitioners) who all work together to provide you with the care you need, when you need it.  We recommend signing up for the patient portal called "MyChart".  Sign up information is provided on this After Visit Summary.  MyChart is used to connect with patients for Virtual Visits (Telemedicine).  Patients are able to view lab/test results, encounter notes, upcoming appointments, etc.  Non-urgent messages can be sent to your provider as well.   To learn more about what you can do with MyChart, go to [email or web address].    Your next appointment:   3 week(s) on Wednesday, May 11 @ 8:45 am with Richardson Dopp, PA.  The format for your next appointment:   In Person  Provider:   Richardson Dopp, PA-C  Other Instructions -None

## 2021-03-01 LAB — LIPID PANEL
Chol/HDL Ratio: 2.7 ratio (ref 0.0–4.4)
Cholesterol, Total: 210 mg/dL — ABNORMAL HIGH (ref 100–199)
HDL: 78 mg/dL (ref >39)
LDL Chol Calc (NIH): 109 mg/dL — ABNORMAL HIGH (ref 0–99)
Triglycerides: 133 mg/dL (ref 0–149)
VLDL Cholesterol Cal: 23 mg/dL (ref 5–40)

## 2021-03-01 LAB — COMPREHENSIVE METABOLIC PANEL
ALT: 6 IU/L (ref 0–32)
AST: 13 IU/L (ref 0–40)
Albumin/Globulin Ratio: 2.1 (ref 1.2–2.2)
Albumin: 4.2 g/dL (ref 3.7–4.7)
Alkaline Phosphatase: 54 IU/L (ref 44–121)
BUN/Creatinine Ratio: 16 (ref 12–28)
BUN: 21 mg/dL (ref 8–27)
Bilirubin Total: 0.5 mg/dL (ref 0.0–1.2)
CO2: 26 mmol/L (ref 20–29)
Calcium: 9.2 mg/dL (ref 8.7–10.3)
Chloride: 102 mmol/L (ref 96–106)
Creatinine, Ser: 1.29 mg/dL — ABNORMAL HIGH (ref 0.57–1.00)
Globulin, Total: 2 g/dL (ref 1.5–4.5)
Glucose: 97 mg/dL (ref 65–99)
Potassium: 3.4 mmol/L — ABNORMAL LOW (ref 3.5–5.2)
Sodium: 143 mmol/L (ref 134–144)
Total Protein: 6.2 g/dL (ref 6.0–8.5)
eGFR: 42 mL/min/{1.73_m2} — ABNORMAL LOW (ref >59)

## 2021-03-01 LAB — PRO B NATRIURETIC PEPTIDE: NT-Pro BNP: 651 pg/mL (ref 0–738)

## 2021-03-01 LAB — CBC
Hematocrit: 39 % (ref 34.0–46.6)
Hemoglobin: 12.8 g/dL (ref 11.1–15.9)
MCH: 25.2 pg — ABNORMAL LOW (ref 26.6–33.0)
MCHC: 32.8 g/dL (ref 31.5–35.7)
MCV: 77 fL — ABNORMAL LOW (ref 79–97)
Platelets: 148 10*3/uL — ABNORMAL LOW (ref 150–450)
RBC: 5.07 x10E6/uL (ref 3.77–5.28)
RDW: 16.6 % — ABNORMAL HIGH (ref 11.7–15.4)
WBC: 9.4 10*3/uL (ref 3.4–10.8)

## 2021-03-02 ENCOUNTER — Other Ambulatory Visit: Payer: Self-pay | Admitting: *Deleted

## 2021-03-02 DIAGNOSIS — I2510 Atherosclerotic heart disease of native coronary artery without angina pectoris: Secondary | ICD-10-CM

## 2021-03-02 DIAGNOSIS — I10 Essential (primary) hypertension: Secondary | ICD-10-CM

## 2021-03-02 DIAGNOSIS — E785 Hyperlipidemia, unspecified: Secondary | ICD-10-CM

## 2021-03-02 DIAGNOSIS — R072 Precordial pain: Secondary | ICD-10-CM

## 2021-03-02 MED ORDER — ROSUVASTATIN CALCIUM 20 MG PO TABS: 20.0000 mg | ORAL_TABLET | Freq: Every day | ORAL | 11 refills | Status: DC

## 2021-03-02 MED ORDER — POTASSIUM CHLORIDE CRYS ER 20 MEQ PO TBCR: 20.0000 meq | EXTENDED_RELEASE_TABLET | Freq: Every day | ORAL | 11 refills | Status: DC

## 2021-03-03 ENCOUNTER — Telehealth (HOSPITAL_COMMUNITY): Payer: Self-pay | Admitting: *Deleted

## 2021-03-03 NOTE — Telephone Encounter (Signed)
Patient given detailed instructions per Myocardial Perfusion Study Information Sheet for the test on 03/09/21. Patient notified to arrive 15 minutes early and that it is imperative to arrive on time for appointment to keep from having the test rescheduled.  If you need to cancel or reschedule your appointment, please call the office within 24 hours of your appointment. . Patient verbalized understanding. [name]

## 2021-03-09 ENCOUNTER — Other Ambulatory Visit: Payer: Medicare HMO | Admitting: *Deleted

## 2021-03-09 ENCOUNTER — Ambulatory Visit (HOSPITAL_COMMUNITY): Payer: Medicare HMO | Attending: Cardiovascular Disease

## 2021-03-09 ENCOUNTER — Other Ambulatory Visit: Payer: Self-pay

## 2021-03-09 VITALS — Ht 62.0 in | Wt 163.0 lb

## 2021-03-09 DIAGNOSIS — I10 Essential (primary) hypertension: Secondary | ICD-10-CM

## 2021-03-09 DIAGNOSIS — R072 Precordial pain: Secondary | ICD-10-CM

## 2021-03-09 DIAGNOSIS — R110 Nausea: Secondary | ICD-10-CM | POA: Diagnosis present

## 2021-03-09 DIAGNOSIS — E785 Hyperlipidemia, unspecified: Secondary | ICD-10-CM

## 2021-03-09 DIAGNOSIS — I2510 Atherosclerotic heart disease of native coronary artery without angina pectoris: Secondary | ICD-10-CM

## 2021-03-09 LAB — BASIC METABOLIC PANEL
BUN/Creatinine Ratio: 19 (ref 12–28)
BUN: 26 mg/dL (ref 8–27)
CO2: 26 mmol/L (ref 20–29)
Calcium: 9.9 mg/dL (ref 8.7–10.3)
Chloride: 100 mmol/L (ref 96–106)
Creatinine, Ser: 1.35 mg/dL — ABNORMAL HIGH (ref 0.57–1.00)
Glucose: 98 mg/dL (ref 65–99)
Potassium: 3.6 mmol/L (ref 3.5–5.2)
Sodium: 143 mmol/L (ref 134–144)
eGFR: 40 mL/min/{1.73_m2} — ABNORMAL LOW (ref >59)

## 2021-03-09 LAB — MYOCARDIAL PERFUSION IMAGING
LV dias vol: 68 mL (ref 46–106)
LV sys vol: 18 mL
Peak HR: 101 {beats}/min
Rest HR: 81 {beats}/min
SDS: 2
SRS: 2
SSS: 4
TID: 0.8

## 2021-03-09 MED ORDER — AMINOPHYLLINE 25 MG/ML IV SOLN
75.0000 mg | Freq: Once | INTRAVENOUS | Status: AC
  Administered 2021-03-09: 150 mg via INTRAVENOUS

## 2021-03-09 MED ORDER — TECHNETIUM TC 99M TETROFOSMIN IV KIT
10.1000 | PACK | Freq: Once | INTRAVENOUS | Status: AC | PRN
  Administered 2021-03-09: 10.1 via INTRAVENOUS
  Filled 2021-03-09: qty 11

## 2021-03-09 MED ORDER — REGADENOSON 0.4 MG/5ML IV SOLN
0.4000 mg | Freq: Once | INTRAVENOUS | Status: AC
  Administered 2021-03-09: 0.4 mg via INTRAVENOUS

## 2021-03-09 MED ORDER — TECHNETIUM TC 99M TETROFOSMIN IV KIT
32.4000 | PACK | Freq: Once | INTRAVENOUS | Status: AC | PRN
Start: 2021-03-09 — End: 2021-03-09
  Administered 2021-03-09: 32.4 via INTRAVENOUS
  Filled 2021-03-09: qty 33

## 2021-03-17 ENCOUNTER — Other Ambulatory Visit: Payer: Self-pay | Admitting: Family Medicine

## 2021-03-17 DIAGNOSIS — Z1231 Encounter for screening mammogram for malignant neoplasm of breast: Secondary | ICD-10-CM

## 2021-03-23 ENCOUNTER — Other Ambulatory Visit: Payer: Self-pay | Admitting: Cardiovascular Disease

## 2021-03-29 NOTE — Progress Notes (Addendum)
Cardiology Office Note:    Date:  03/30/2021   ID:  [name], DOB 1941/11/12, MRN [id number]  PCP:  Chesley Noon, MD   [facility] Providers Cardiologist:  Mertie Moores, MD     Referring MD: Chesley Noon, MD   Chief Complaint:  Follow-up (CAD)    Patient Profile:    [name] is a 80 y.o. female with:   Coronary artery disease  ? S/p CABG in 2001 ? Echocardiogram 3/19: EF 60-65 ? Myoview 5/18: Low risk   Diabetes mellitus   Hypertension   Hyperlipidemia   ESRD s/p Renal Transplant in 2012  Hx of GI bleed (AVMs)  Gout   Aortic atherosclerosis   Asthma/COPD with chronic hypoxemic respiratory failure, home O2  Followed at [facility]  Prior CV studies: Myocardial Perfusion Imaging 03/09/2021 Narrative  The left ventricular ejection fraction is hyperdynamic (>65%).  Nuclear stress EF: 74%.  There was no ST segment deviation noted during stress.  Findings consistent with prior myocardial infarction.  This is a low risk study. Small inferior apical wall infarct no ischemia EF 74% 2018 images not available but commented On "attenuation" in this area  Echocardiogram 02/15/18 Mod LVH, EF 60-65, no RWMA, Gr 1 DD, AV sclerosis w/o AS, trivial MR,  mild RAE  Echocardiogram 03/22/17 EF 60-65, mild pulmonary hypertension (PASP 41)  GATED SPECT MYO PERF W/LEXISCAN STRESS 1D 03/22/2017 Narrative  Nuclear stress EF: 76%.  Inferior/inferolateral thinning with improvement in the inferolateral region (mid/distal) consistent with soft tissue attenuation and small region of ischemia.  This is a low risk study.     History of Present Illness: [name] was last seen in 4/22.  She had symptoms of dyspnea on exertion and chest pain.  I placed her on isosorbide and obtained a Myoview.  This was low risk without significant ischemia. She returns for f/u.  She is here alone.  She remains on chronic O2.  She is followed by [facility]  pulmonology.  She notes resolution of chest discomfort with starting isosorbide.  However, she had significant headaches.  Breathing is stable.  She gets short of breath with just mild activities.  She sleeps on an incline chronically.  She has not had lower extremity edema.  She has not had syncope        Past Medical History:  Diagnosis Date  . 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 Asthma   . Cellulitis 12/19/2019   right upper arm  . Coronary artery disease    cath 11/2007, grafts patent /  Nuclear, June, 2011, prior inferior MI with mild peri-infarct ischemia, anterior breast attenuation, EF 67%, done for renal transplant assessment / Low level exercise/Lexiscan Myoview (11/2013): EF 67%, no ischemi; normal study  . Diabetes mellitus    type 2  . ESRD on dialysis [facility])    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, 2012  . Family history of breast cancer   . Family history of prostate cancer   . GERD (gastroesophageal reflux disease)   . GI bleed 11/26/2010   January, 2012 , AVM  . Gout   . History of methicillin resistant staphylococcus aureus (MRSA)   . Hyperlipidemia   . Hyperparathyroidism   . Hypertension   . Myocardial infarction ([name])   . Osteoporosis   . Pneumonia 08/2010    Hospitalization, October, 2011  . Primary osteoarthritis,  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  . S/P kidney transplant    September, 2012, [facility]  . Subacromial impingement of 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    Current Medications: Current Meds  Medication Sig  . acetaminophen (TYLENOL) 650 MG CR tablet Take 650 mg by mouth every morning.  Marland Kitchen albuterol (PROVENTIL) (2.5 MG/3ML) 0.083% nebulizer solution Take 2.5 mg by nebulization every 6 (six) hours as needed for wheezing or shortness of breath.  Marland Kitchen albuterol (VENTOLIN HFA) 108 (90 Base) MCG/ACT inhaler Inhale 1-2 puffs into the lungs every 6 (six) hours as needed for wheezing or shortness of breath.  Marland Kitchen  amLODipine (NORVASC) 10 MG tablet Take 10 mg by mouth daily.   Marland Kitchen aspirin EC 81 MG tablet Take 81 mg by mouth daily.  . Azelastine HCl 0.15 % SOLN Place 1 spray into both nostrils daily as needed for allergies.  . budesonide (PULMICORT) 0.25 MG/2ML nebulizer solution Inhale 0.25 mg into the lungs as needed.  . calcitRIOL (ROCALTROL) 0.25 MCG capsule Take 0.25 mcg by mouth daily.   . fluticasone furoate-vilanterol (BREO ELLIPTA) 200-25 MCG/INH AEPB Inhale 1 puff into the lungs daily as needed (respiratory issues.).   Marland Kitchen isosorbide mononitrate (IMDUR) 30 MG 24 hr tablet Take 0.5 tablets (15 mg total) by mouth daily.  . metoprolol tartrate (LOPRESSOR) 50 MG tablet Take 1 tablet (50 mg total) by mouth 2 (two) times daily.  . nitroGLYCERIN (NITROSTAT) 0.4 MG SL tablet Place 1 tablet (0.4 mg total) under the tongue every 5 (five) minutes as needed for chest pain.  Marland Kitchen omeprazole (PRILOSEC) 20 MG capsule Take 20 mg by mouth daily after breakfast.  . potassium chloride SA (KLOR-CON M20) 20 MEQ tablet Take 1 tablet (20 mEq total) by mouth daily.  . predniSONE (DELTASONE) 5 MG tablet Take 5 mg by mouth 2 (two) times daily.  . rosuvastatin (CRESTOR) 20 MG tablet Take 1 tablet (20 mg total) by mouth daily.  . sennosides-docusate sodium (SENOKOT-S) 8.6-50 MG tablet Take 1 tablet by mouth daily as needed (constipation.).   Marland Kitchen tacrolimus (PROGRAF) 1 MG capsule Take 5 mg by mouth See admin instructions. Take 5 mg by mouth at 9 AM and 5 mg at 9 PM  . Tiotropium Bromide Monohydrate 1.25 MCG/ACT AERS Inhale 1 puff into the lungs daily.  . [DISCONTINUED] isosorbide mononitrate (IMDUR) 30 MG 24 hr tablet Take 1 tablet (30 mg total) by mouth daily.  . [DISCONTINUED] metoprolol tartrate (LOPRESSOR) 25 MG tablet TAKE 1 TABLET BY MOUTH TWICE A DAY     Allergies:   Sodium hypochlorite, Lactose, Asa arthritis strength-antacid [aspirin buffered], Aspirin, Atorvastatin, Banana, Lactose intolerance (gi), and Lisinopril   Social  History   Tobacco Use  . Smoking status: Former Smoker    Packs/day: 1.00    Years: 8.00    Pack years: 8.00    Types: Cigarettes    Quit date: 11/21/1983    Years since quitting: 37.3  . Smokeless tobacco: Never Used  Vaping Use  . Vaping Use: Never used  Substance Use Topics  . Alcohol use: No  . Drug use: No     Family Hx: The patient's family history includes Breast cancer (age of onset: 10) in her sister; Lung cancer in her brother; Prostate cancer in her brother. There is no history of Cancer.  ROS   EKGs/Labs/Other Test Reviewed:    EKG:  EKG is notn ordered today.  The ekg ordered today demonstrates /a  Recent Labs: 02/28/2021: ALT 6; Hemoglobin 12.8; NT-Pro BNP 651;  Platelets 148 03/09/2021: BUN 26; Creatinine, Ser 1.35; Potassium 3.6; Sodium 143   Recent Lipid Panel Lab Results  Component Value Date/Time   CHOL 210 (H) 02/28/2021 10:14 AM   TRIG 133 02/28/2021 10:14 AM   HDL 78 02/28/2021 10:14 AM   CHOLHDL 2.7 02/28/2021 10:14 AM   CHOLHDL 2.6 02/14/2018 02:23 AM   LDLCALC 109 (H) 02/28/2021 10:14 AM   LDLDIRECT 136.3 02/26/2012 05:15 PM      Risk Assessment/Calculations:      Physical Exam:    VS:  BP (!) 148/88   Pulse 84   Ht 5\' 2"  (1.575 m)   Wt 164 lb 3.2 oz (74.5 kg)   SpO2 91% Comment: 2L oxygen  BMI 30.03 kg/m     Wt Readings from Last 3 Encounters:  03/30/21 164 lb 3.2 oz (74.5 kg)  03/09/21 163 lb (73.9 kg)  02/28/21 163 lb 9.6 oz (74.2 kg)     Constitutional:      Appearance: Healthy appearance. Not in distress.  Neck:     Vascular: JVD normal.  Pulmonary:     Breath sounds: No wheezing. No rales.  Cardiovascular:     Normal rate. Regular rhythm. Normal S1. Normal S2.  Edema:    Peripheral edema absent.  Abdominal:     Palpations: Abdomen is soft.  Skin:    General: Skin is warm and dry.  Neurological:     Mental Status: Alert and oriented to person, place and time.     Cranial Nerves: Cranial nerves are intact.          ASSESSMENT & PLAN:    1. Coronary artery disease involving native coronary artery of native heart with angina pectoris ([place]) S/p CABG in 2001.  Recent Myoview low risk.  She has had improved symptoms with isosorbide.  We discussed the rationale for medical therapy for stable symptoms.  Unfortunately, she is having side effects with headaches.  I have asked her to decrease isosorbide to 15 mg daily.  If headaches continue, she can stop this medication.  I have also asked her to increase her metoprolol to tartrate to 50 mg twice daily.  Continue current dose of amlodipine, aspirin, rosuvastatin.  Follow-up in 4 months.  She knows to contact us if she has recurrent exertional chest discomfort.  If she has recurrent symptoms despite adequate antianginal therapy, she would likely need cardiac catheterization.  2. Essential hypertension Blood pressure somewhat elevated.  She had to walk a long distance after finding a parking space.  She has not taken medications yet today.  I will adjust her metoprolol for antianginal control.  Adjust isosorbide as noted above.  Continue current dose of amlodipine.  3. Hyperlipidemia LDL goal <70 Rosuvastatin was recently adjusted due to above optimal LDL.  She will have follow-up lipids and LFTs in 2 to 3 months.  4. Chronic obstructive pulmonary disease, unspecified COPD type ([place]) She has chronic hypoxic respiratory failure and is on chronic O2.  She is followed by [facility] pulmonology.     Dispo:  Return in about 4 months (around 07/31/2021) for Routine Follow Up, w/ Dr. Acie Fredrickson.   Medication Adjustments/Labs and Tests Ordered: Current medicines are reviewed at length with the patient today.  Concerns regarding medicines are outlined above.  Tests Ordered: No orders of the defined types were placed in this encounter.  Medication Changes: Meds ordered this encounter  Medications  . isosorbide mononitrate (IMDUR) 30 MG 24 hr tablet    Sig: Take  0.5 tablets  (15 mg total) by mouth daily.    Dispense:  45 tablet    Refill:  3  . metoprolol tartrate (LOPRESSOR) 50 MG tablet    Sig: Take 1 tablet (50 mg total) by mouth 2 (two) times daily.    Dispense:  180 tablet    Refill:  3    Signed, Richardson Dopp, PA-C  03/30/2021 10:02 AM    [facility] [place], [place], [place]  [address] Phone: [phone]; Fax: [phone]

## 2021-03-30 ENCOUNTER — Ambulatory Visit: Payer: Medicare HMO | Admitting: Physician Assistant

## 2021-03-30 ENCOUNTER — Other Ambulatory Visit: Payer: Self-pay

## 2021-03-30 ENCOUNTER — Encounter (INDEPENDENT_AMBULATORY_CARE_PROVIDER_SITE_OTHER): Payer: Self-pay

## 2021-03-30 ENCOUNTER — Encounter: Payer: Self-pay | Admitting: Physician Assistant

## 2021-03-30 VITALS — BP 148/88 | HR 84 | Ht 62.0 in | Wt 164.2 lb

## 2021-03-30 DIAGNOSIS — I25119 Atherosclerotic heart disease of native coronary artery with unspecified angina pectoris: Secondary | ICD-10-CM

## 2021-03-30 DIAGNOSIS — I10 Essential (primary) hypertension: Secondary | ICD-10-CM

## 2021-03-30 DIAGNOSIS — E785 Hyperlipidemia, unspecified: Secondary | ICD-10-CM

## 2021-03-30 DIAGNOSIS — J449 Chronic obstructive pulmonary disease, unspecified: Secondary | ICD-10-CM

## 2021-03-30 MED ORDER — METOPROLOL TARTRATE 50 MG PO TABS: 50.0000 mg | ORAL_TABLET | Freq: Two times a day (BID) | ORAL | 3 refills | Status: DC

## 2021-03-30 MED ORDER — ISOSORBIDE MONONITRATE ER 30 MG PO TB24: 15.0000 mg | ORAL_TABLET | Freq: Every day | ORAL | 3 refills | Status: DC

## 2021-03-30 NOTE — Patient Instructions (Signed)
Medication Instructions:  Your physician has recommended you make the following change in your medication:  1. DECREASE IMDUR TO 15 MG DAILY. IF YOUR HEADACHES CONTINUE, PLEASE STOP AN CALL OUR OFFICE TO LET us KNOW.  2. INCREASE METOPROLOL TO 50 MG TWICE DAILY.  *If you need a refill on your cardiac medications before your next appointment, please call your pharmacy*   Lab Work: LAB APPOINTMENT ON July 13TH [place] If you have labs (blood work) drawn today and your tests are completely normal, you will receive your results only by: Marland Kitchen MyChart Message (if you have MyChart) OR . A paper copy in the mail If you have any lab test that is abnormal or we need to change your treatment, we will call you to review the results.   Testing/Procedures: none   Follow-Up: At [facility], you and your health needs are our priority.  As part of our continuing mission to provide you with exceptional heart care, we have created designated Provider Care Teams.  These Care Teams include your primary Cardiologist (physician) and Advanced Practice Providers (APPs -  Physician Assistants and Nurse Practitioners) who all work together to provide you with the care you need, when you need it.  We recommend signing up for the patient portal called "MyChart".  Sign up information is provided on this After Visit Summary.  MyChart is used to connect with patients for Virtual Visits (Telemedicine).  Patients are able to view lab/test results, encounter notes, upcoming appointments, etc.  Non-urgent messages can be sent to your provider as well.   To learn more about what you can do with MyChart, go to [email or web address].    Your next appointment:   August 01, 2021 AT 9:00 AM  The format for your next appointment:   In Person  Provider:   Mertie Moores, MD

## 2021-04-07 ENCOUNTER — Other Ambulatory Visit: Payer: Self-pay | Admitting: Cardiovascular Disease

## 2021-05-09 ENCOUNTER — Ambulatory Visit: Payer: Medicare HMO

## 2021-05-12 ENCOUNTER — Ambulatory Visit (INDEPENDENT_AMBULATORY_CARE_PROVIDER_SITE_OTHER): Payer: Medicare HMO | Admitting: Endocrinology

## 2021-05-12 ENCOUNTER — Other Ambulatory Visit: Payer: Self-pay

## 2021-05-12 VITALS — BP 150/78 | HR 88 | Ht 62.0 in | Wt 160.0 lb

## 2021-05-12 DIAGNOSIS — E1122 Type 2 diabetes mellitus with diabetic chronic kidney disease: Secondary | ICD-10-CM

## 2021-05-12 DIAGNOSIS — N1831 Chronic kidney disease, stage 3a: Secondary | ICD-10-CM | POA: Diagnosis not present

## 2021-05-12 DIAGNOSIS — Z794 Long term (current) use of insulin: Secondary | ICD-10-CM | POA: Diagnosis not present

## 2021-05-12 LAB — POCT GLYCOSYLATED HEMOGLOBIN (HGB A1C): Hemoglobin A1C: 6.5 % — AB (ref 4.0–5.6)

## 2021-05-12 NOTE — Progress Notes (Signed)
Subjective:    Patient ID: [name], female    DOB: [birth year], 80 y.o.   MRN: [id number]  HPI Pt returns for f/u of diabetes mellitus: DM type: 2 Dx'ed: 6712 Complications: CAD, PAD (seen on CT), and renal failure (transplant 2013).   Therapy: no medication now GDM: never DKA: never Severe hypoglycemia: never Pancreatitis: never Pancreatic imaging: normal on 2015 CT Other: she has never been on insulin; she intermittently takes steroids for COPD; she requests generic meds only.    Interval history: pt says cbg's are well-controlled.  pt states she feels well in general, except for chronic sob.  No recent change in low prednisone dosage.   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, hold ASA 2-3 weeks - consider plavix instead of ASA   Asthma    Cellulitis 12/19/2019   right upper arm   Coronary artery disease    cath 11/2007, grafts patent /  Nuclear, June, 2011, prior inferior MI with mild peri-infarct ischemia, anterior breast attenuation, EF 67%, done for renal transplant assessment / Low level exercise/Lexiscan Myoview (11/2013): EF 67%, no ischemi; normal study   Diabetes mellitus    type 2   ESRD on dialysis [facility])    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, 2012   Family history of breast cancer    Family history of prostate cancer    GERD (gastroesophageal reflux disease)    GI bleed 11/26/2010   January, 2012 , AVM   Gout    History of methicillin resistant staphylococcus aureus (MRSA)    Hyperlipidemia    Hyperparathyroidism    Hypertension    Myocardial infarction [facility])    Osteoporosis    Pneumonia 08/2010    Hospitalization, October, 2011   Primary osteoarthritis, 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   S/P kidney transplant    September, 2012, [facility]   Subacromial impingement of 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    Past Surgical History:  Procedure Laterality Date   ABDOMINAL HYSTERECTOMY  1980'S   ARTERIOVENOUS GRAFT  PLACEMENT Left 03/04/2007   forearm   BIOPSY  09/30/2019   Procedure: BIOPSY;  Surgeon: Wilford Corner, MD;  Location: [facility];  Service: Endoscopy;;   BREAST EXCISIONAL BIOPSY Right    benign more than 10 yr ago   [place]  06/03/2002; 12/04/2007   COLONOSCOPY WITH PROPOFOL N/A 07/14/2014   Procedure: COLONOSCOPY WITH PROPOFOL;  Surgeon: Lear Ng, MD;  Location: [facility];  Service: Endoscopy;  Laterality: N/A;   COLONOSCOPY WITH PROPOFOL N/A 09/30/2019   Procedure: COLONOSCOPY WITH PROPOFOL;  Surgeon: Wilford Corner, MD;  Location: [facility];  Service: Endoscopy;  Laterality: N/A;   CORONARY ARTERY BYPASS GRAFT  06/06/2002   x 4   HOT HEMOSTASIS N/A 07/14/2014   Procedure: HOT HEMOSTASIS (ARGON PLASMA COAGULATION/BICAP);  Surgeon: Lear Ng, MD;  Location: [facility];  Service: Endoscopy;  Laterality: N/A;   IR FLUORO GUIDE CV LINE RIGHT  12/22/2019   IR US GUIDE VASC ACCESS RIGHT  12/22/2019   KIDNEY TRANSPLANT Right 08/04/2011   ORIF PATELLA Left 04/06/2016   Procedure: OPEN REDUCTION INTERNAL (ORIF) LEFT  PATELLA;  Surgeon: Ninetta Lights, MD;  Location: [place];  Service: Orthopedics;  Laterality: Left;   POLYPECTOMY  09/30/2019   Procedure: POLYPECTOMY;  Surgeon: Wilford Corner, MD;  Location: [facility];  Service: Endoscopy;;   SHOULDER ARTHROSCOPY WITH ROTATOR CUFF REPAIR AND SUBACROMIAL DECOMPRESSION Left 08/03/2016   Procedure: LEFT SHOULDER ARTHROSCOPY WITH ROTATOR CUFF REPAIR AND SUBACROMIAL DECOMPRESSION with distal claviculectomy and extentsive debridement;  Surgeon: Ninetta Lights, MD;  Location: [place];  Service: Orthopedics;  Laterality: Left;  Block   THROMBECTOMY AND REVISION OF ARTERIOVENTOUS (AV) GORETEX  GRAFT Left 05/08/2007   forearm   TOTAL HIP ARTHROPLASTY  12/18/2012   Procedure: TOTAL HIP ARTHROPLASTY;  Surgeon: Ninetta Lights, MD;  Location: [place];  Service: Orthopedics;  Laterality: Left;   VESICOVAGINAL FISTULA CLOSURE W/ TAH  1984    Social History   Socioeconomic History   Marital status: Divorced    Spouse name: Not on file   Number of children: Not on file   Years of education: Not on file   Highest education level: Not on file  Occupational History   Not on file  Tobacco Use   Smoking status: Former    Packs/day: 1.00    Years: 8.00    Pack years: 8.00    Types: Cigarettes    Quit date: 11/21/1983    Years since quitting: 37.5   Smokeless tobacco: Never  Vaping Use   Vaping Use: Never used  Substance and Sexual Activity   Alcohol use: No   Drug use: No   Sexual activity: Not on file  Other Topics Concern   Not on file  Social History Narrative   Not on file   Social Determinants of Health   Financial Resource Strain: Not on file  Food Insecurity: Not on file  Transportation Needs: Not on file  Physical Activity: Not on file  Stress: Not on file  Social Connections: Not on file  Intimate Partner Violence: Not on file    Current Outpatient Medications on File Prior to Visit  Medication Sig Dispense Refill   acetaminophen (TYLENOL) 650 MG CR tablet Take 650 mg by mouth every morning.     albuterol (PROVENTIL) (2.5 MG/3ML) 0.083% nebulizer solution Take 2.5 mg by nebulization every 6 (six) hours as needed for wheezing or shortness of breath.     albuterol (VENTOLIN HFA) 108 (90 Base) MCG/ACT inhaler Inhale 1-2 puffs into the lungs every 6 (six) hours as needed for wheezing or shortness of breath.     amLODipine (NORVASC) 10 MG tablet Take 10 mg by mouth daily.      aspirin EC 81 MG tablet Take 81 mg by mouth daily.     Azelastine HCl 0.15 % SOLN Place 1 spray into both nostrils daily as needed for allergies.     budesonide (PULMICORT) 0.25 MG/2ML nebulizer solution Inhale 0.25 mg into the lungs as needed.     calcitRIOL (ROCALTROL) 0.25 MCG capsule Take 0.25 mcg by mouth  daily.      fluticasone furoate-vilanterol (BREO ELLIPTA) 200-25 MCG/INH AEPB Inhale 1 puff into the lungs daily as needed (respiratory issues.).      isosorbide mononitrate (IMDUR) 30 MG 24 hr tablet Take 0.5 tablets (15 mg total) by mouth daily. 45 tablet 3   metoprolol tartrate (LOPRESSOR) 50 MG tablet Take 1 tablet (50 mg total) by mouth 2 (two) times daily. 180 tablet 3   nitroGLYCERIN (NITROSTAT) 0.4 MG SL tablet Place 1 tablet (0.4 mg total) under the tongue every 5 (five) minutes as needed for chest pain. 25 tablet 1   omeprazole (PRILOSEC) 20 MG capsule Take 20 mg by mouth daily after breakfast.  potassium chloride SA (KLOR-CON M20) 20 MEQ tablet Take 1 tablet (20 mEq total) by mouth daily. 30 tablet 11   predniSONE (DELTASONE) 5 MG tablet Take 5 mg by mouth 2 (two) times daily.     rosuvastatin (CRESTOR) 20 MG tablet Take 1 tablet (20 mg total) by mouth daily. 30 tablet 11   sennosides-docusate sodium (SENOKOT-S) 8.6-50 MG tablet Take 1 tablet by mouth daily as needed (constipation.).      tacrolimus (PROGRAF) 1 MG capsule Take 5 mg by mouth See admin instructions. Take 5 mg by mouth at 9 AM and 5 mg at 9 PM     Tiotropium Bromide Monohydrate 1.25 MCG/ACT AERS Inhale 1 puff into the lungs daily.     No current facility-administered medications on file prior to visit.    Allergies  Allergen Reactions   Sodium Hypochlorite Shortness Of Breath   Lactose Nausea And Vomiting   Asa Arthritis Strength-Antacid [Aspirin Buffered] Nausea And Vomiting and Other (See Comments)    STOMACH BURNS, also   Aspirin Nausea And Vomiting    Per pt. "can tolerate the enteric coated tablets".    Atorvastatin Other (See Comments)    Patient's skin was skin was sensitive   Banana Nausea And Vomiting   Lactose Intolerance (Gi) Nausea And Vomiting   Lisinopril Cough    Family History  Problem Relation Age of Onset   Breast cancer Sister 79   Prostate cancer Brother    Lung cancer Brother     Cancer Neg Hx     BP (!) 150/78 (BP Location: Right Arm, Patient Position: Sitting, Cuff Size: Normal)   Pulse 88   Ht 5\' 2"  (1.575 m)   Wt 160 lb (72.6 kg)   SpO2 98%   BMI 29.26 kg/m    Review of Systems She denies hypoglycemia    Objective:   Physical Exam VITAL SIGNS:  See vs page GENERAL: no distress.  Has 02 on Pulses: dorsalis pedis intact bilat.   MSK: no deformity of the feet CV: no leg edema Skin:  no ulcer on the feet.  normal color and temp on the feet. Neuro: sensation is intact to touch on the feet Ext: there is bilateral onychomycosis of the toenails    Lab Results  Component Value Date   HGBA1C 6.5 (A) 05/12/2021       Assessment & Plan:  Type 2 DM: stable off rx.  Patient Instructions  Your blood pressure is high today.  Please see your primary care provider soon, to have it rechecked check your blood sugar once a week.  vary the time of day when you check, between before the 3 meals, and at bedtime.  also check if you have symptoms of your blood sugar being too high or too low.  please keep a record of the readings and bring it to your next appointment here (or you can bring the meter itself).  You can write it on any piece of paper.  please call us sooner if your blood sugar goes below 70, or if you have a lot of readings over 200.   No medication is needed for the diabetes now.  However, please call if the prednisone is increased.   Please come back for a follow-up appointment in 6 months.

## 2021-05-12 NOTE — Patient Instructions (Addendum)
Your blood pressure is high today.  Please see your primary care provider soon, to have it rechecked check your blood sugar once a week.  vary the time of day when you check, between before the 3 meals, and at bedtime.  also check if you have symptoms of your blood sugar being too high or too low.  please keep a record of the readings and bring it to your next appointment here (or you can bring the meter itself).  You can write it on any piece of paper.  please call us sooner if your blood sugar goes below 70, or if you have a lot of readings over 200.   No medication is needed for the diabetes now.  However, please call if the prednisone is increased.   Please come back for a follow-up appointment in 6 months.

## 2021-05-13 ENCOUNTER — Ambulatory Visit
Admission: RE | Admit: 2021-05-13 | Discharge: 2021-05-13 | Disposition: A | Discharge status: Home / Self Care | Payer: Medicare HMO | Source: Ambulatory Visit | Attending: Family Medicine | Admitting: Family Medicine

## 2021-05-13 DIAGNOSIS — Z1231 Encounter for screening mammogram for malignant neoplasm of breast: Secondary | ICD-10-CM

## 2021-06-01 ENCOUNTER — Other Ambulatory Visit: Payer: Self-pay

## 2021-06-01 ENCOUNTER — Other Ambulatory Visit: Payer: Medicare Other | Admitting: *Deleted

## 2021-06-01 DIAGNOSIS — I10 Essential (primary) hypertension: Secondary | ICD-10-CM

## 2021-06-01 DIAGNOSIS — E785 Hyperlipidemia, unspecified: Secondary | ICD-10-CM

## 2021-06-01 DIAGNOSIS — I2510 Atherosclerotic heart disease of native coronary artery without angina pectoris: Secondary | ICD-10-CM

## 2021-06-01 DIAGNOSIS — R072 Precordial pain: Secondary | ICD-10-CM

## 2021-06-01 LAB — LIPID PANEL
Chol/HDL Ratio: 2.5 ratio (ref 0.0–4.4)
Cholesterol, Total: 179 mg/dL (ref 100–199)
HDL: 72 mg/dL (ref >39)
LDL Chol Calc (NIH): 88 mg/dL (ref 0–99)
Triglycerides: 108 mg/dL (ref 0–149)
VLDL Cholesterol Cal: 19 mg/dL (ref 5–40)

## 2021-06-01 LAB — HEPATIC FUNCTION PANEL
ALT: 10 IU/L (ref 0–32)
AST: 15 IU/L (ref 0–40)
Albumin: 4.5 g/dL (ref 3.7–4.7)
Alkaline Phosphatase: 54 IU/L (ref 44–121)
Bilirubin Total: 0.5 mg/dL (ref 0.0–1.2)
Bilirubin, Direct: 0.13 mg/dL (ref 0.00–0.40)
Total Protein: 6.7 g/dL (ref 6.0–8.5)

## 2021-06-02 ENCOUNTER — Telehealth: Payer: Self-pay | Admitting: *Deleted

## 2021-06-02 DIAGNOSIS — I25119 Atherosclerotic heart disease of native coronary artery with unspecified angina pectoris: Secondary | ICD-10-CM

## 2021-06-02 DIAGNOSIS — E785 Hyperlipidemia, unspecified: Secondary | ICD-10-CM

## 2021-06-02 MED ORDER — ROSUVASTATIN CALCIUM 40 MG PO TABS: 40.0000 mg | ORAL_TABLET | Freq: Every day | ORAL | 3 refills | Status: DC

## 2021-06-02 NOTE — Telephone Encounter (Signed)
-----   Message from Liliane Shi, PA-C sent at 06/02/2021  8:37 AM EDT ----- LDL cholesterol is improved but still above goal.  Goal LDL is <70.  LFTs normal. PLAN:  -Increase rosuvastatin to 40 mg daily -Arrange fasting lipids, LFTs in 3 months. Richardson Dopp, PA-C    06/02/2021 8:35 AM

## 2021-06-07 ENCOUNTER — Telehealth (HOSPITAL_COMMUNITY): Payer: Self-pay

## 2021-06-07 NOTE — Telephone Encounter (Signed)
Pt called and stated that [facility] gave her a piece of paper needing her to do pulmonary rehab. I advised pt that we would need a referral faxed over and I also advised pt of the 1-3 months pt understood and stated that she will contact [facility] and let them know to send over a pulmonary rehab referral.

## 2021-07-31 ENCOUNTER — Encounter: Payer: Self-pay | Admitting: Cardiovascular Disease

## 2021-07-31 NOTE — Progress Notes (Signed)
Cardiology Office Note   Date:  08/01/2021   ID:  [name], DOB December 06, 1940, MRN [id number]  PCP:  Chesley Noon, MD  Cardiologist:  Mertie Moores, MD   Chief Complaint  Patient presents with   Coronary Artery Disease   Hypertension   Problem list 1. CAD - CABG , 2001 2. ESRD - s/p renal transplant - Sept. 2012  3. GI bleed:  4. DM    DARIS [name] is a 80 y.o. female who presents  Today to follow-up shortness of breath. I saw her last April, 2015. Before that she had had some difficulties with shortness of breath. We thought that some of her symptoms were related to increased levels of Prograf.  Fortunately she is doing well. She is not having any chest pain or significant shortness of breath.  March 07, 2016:  Doing well.  Previous patient of Dr. Ron Parker. No issues.   Some DOE if she walks too far.  No Cp , has not had to take any NTG .  March 14, 2017:  Has had some dyspnea with exertion,  Seems to have worsened over the past month or so Has to stop and rest twice when walking to the mailbox   Has had some allergy issues Stays fatigued.   March 04, 2018:  Ms. Crumbley is seen back today for follow-up of her coronary artery disease. Been started on home oxygen.  She has history of chronic asthma end-stage renal disease, status post renal transplant Was in the hospital in March - was not D/C'd on home O2.  Has developed worsening dyspnea . O2 sats were in the 80s  No CP  O2 sat was 81% when she arrived in the office today  Came up to 90% with O2  2 liters / min   March 04, 2020:  Mrs. Sekhon is seen back today for follow-up of her coronary artery disease.  She has a history of coronary artery bypass grafting in 2001.  She has end-stage renal disease and is s/p renal transplant about 9 years.   Avoids salt ,  Fried foods  She had lipids drawn at Dr. Fayrene Fearing office in February.  Her total cholesterol is 140.  The triglyceride level is 142.  The HDL is  54.  The LDL is 62.  Complete metabolic profile drawn at that time reveals a creatinine of 1.57.  The sodium is 141.  The potassium is 4.6.  Liver enzymes look normal.   Sept. 12, 2022  [name] is seen today for follow up of CAD, CABG, ESRD, HLD BP is a bit elevated - has not taken her meds Coughs with swallowing fairly frequently  Concerning for aspiration .  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, hold ASA 2-3 weeks - consider plavix instead of ASA   Asthma    Cellulitis 12/19/2019   right upper arm   Coronary artery disease    cath 11/2007, grafts patent /  Nuclear, June, 2011, prior inferior MI with mild peri-infarct ischemia, anterior breast attenuation, EF 67%, done for renal transplant assessment / Low level exercise/Lexiscan Myoview (11/2013): EF 67%, no ischemi; normal study   Diabetes mellitus    type 2   ESRD on dialysis [facility])    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, 2012   Family history of breast cancer    Family history  of prostate cancer    GERD (gastroesophageal reflux disease)    GI bleed 11/26/2010   January, 2012 , AVM   Gout    History of methicillin resistant staphylococcus aureus (MRSA)    Hyperlipidemia    Hyperparathyroidism    Hypertension    Myocardial infarction [facility])    Osteoporosis    Pneumonia 08/2010    Hospitalization, October, 2011   Primary osteoarthritis, 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   S/P kidney transplant    September, 2012, [facility]   Subacromial impingement of 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    Past Surgical History:  Procedure Laterality Date   ABDOMINAL HYSTERECTOMY  1980'S   ARTERIOVENOUS GRAFT PLACEMENT Left 03/04/2007   forearm   BIOPSY  09/30/2019   Procedure: BIOPSY;  Surgeon: Wilford Corner, MD;  [facility];  Service: Endoscopy;;   BREAST EXCISIONAL BIOPSY Right    benign more than 10 yr ago   [place]  06/03/2002; 12/04/2007   COLONOSCOPY WITH PROPOFOL N/A 07/14/2014   Procedure: COLONOSCOPY WITH PROPOFOL;  Surgeon: Lear Ng, MD;  [facility];  Service: Endoscopy;  Laterality: N/A;   COLONOSCOPY WITH PROPOFOL N/A 09/30/2019   Procedure: COLONOSCOPY WITH PROPOFOL;  Surgeon: Wilford Corner, MD;  [facility];  Service: Endoscopy;  Laterality: N/A;   CORONARY ARTERY BYPASS GRAFT  06/06/2002   x 4   HOT HEMOSTASIS N/A 07/14/2014   Procedure: HOT HEMOSTASIS (ARGON PLASMA COAGULATION/BICAP);  Surgeon: Lear Ng, MD;  Location: [facility];  Service: Endoscopy;  Laterality: N/A;   IR FLUORO GUIDE CV LINE RIGHT  12/22/2019   IR US GUIDE VASC ACCESS RIGHT  12/22/2019   KIDNEY TRANSPLANT Right 08/04/2011   ORIF PATELLA Left 04/06/2016   Procedure: OPEN REDUCTION INTERNAL (ORIF) LEFT  PATELLA;  Surgeon: Ninetta Lights, MD;  Location: [place];  Service: Orthopedics;  Laterality: Left;   POLYPECTOMY  09/30/2019   Procedure: POLYPECTOMY;  Surgeon: Wilford Corner, MD;  [facility];  Service: Endoscopy;;   SHOULDER ARTHROSCOPY WITH ROTATOR CUFF REPAIR AND SUBACROMIAL DECOMPRESSION Left 08/03/2016   Procedure: LEFT SHOULDER ARTHROSCOPY WITH ROTATOR CUFF REPAIR AND SUBACROMIAL DECOMPRESSION with distal claviculectomy and extentsive debridement;  Surgeon: Ninetta Lights, MD;  Location: [place];  Service: Orthopedics;  Laterality: Left;  Block   THROMBECTOMY AND REVISION OF ARTERIOVENTOUS (AV) GORETEX  GRAFT Left 05/08/2007   forearm   TOTAL HIP ARTHROPLASTY  12/18/2012   Procedure: TOTAL HIP ARTHROPLASTY;  Surgeon: Ninetta Lights, MD;  Location: [place];  Service: Orthopedics;  Laterality: Left;   VESICOVAGINAL FISTULA CLOSURE W/ TAH  1984    Patient Active Problem List   Diagnosis Date Noted   Coronary artery disease     Priority: High   Olecranon bursitis of right elbow 12/26/2019    Right arm cellulitis 12/18/2019   Fever    Hx of colonic polyps 09/30/2019   Family history of breast cancer    Family history of prostate cancer    Family history of lung cancer 08/08/2018   Shoulder pain, right 04/04/2018   Severe persistent asthma with (acute) exacerbation 02/13/2018   GERD (gastroesophageal reflux disease) 02/12/2018   Severe persistent asthma with acute exacerbation 02/12/2018   Complete rotator cuff tear of 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   Subacromial impingement of 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   Primary osteoarthritis, 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   History of methicillin resistant staphylococcus  aureus (MRSA)    Benign neoplasm of colon 07/14/2014   Nausea & vomiting 12/22/2013   Dyspnea 12/22/2013   Diabetes ([place]) 02/28/2013   Preop cardiovascular exam    Type II or unspecified type diabetes mellitus without mention of complication, not stated as uncontrolled 06/05/2012   Encounter for long-term (current) use of other medications 02/26/2012   Type II or unspecified type diabetes mellitus with renal manifestations, not stated as uncontrolled(250.40) 02/26/2012   S/P kidney transplant    H/O steroid therapy    Anemia    Hyperlipidemia    Hypertension    Aspirin allergy    Hx of CABG    Ejection fraction    Anemia    GI bleed 11/26/2010   Pneumonia 08/20/2010   FOOT PAIN 01/20/2010   THROMBOCYTOPENIA 01/11/2009   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     albuterol (PROVENTIL) (2.5 MG/3ML) 0.083% nebulizer solution Take 2.5 mg by nebulization every 6 (six) hours as needed for wheezing or shortness of breath.     albuterol (VENTOLIN HFA) 108 (90 Base)  MCG/ACT inhaler Inhale 1-2 puffs into the lungs every 6 (six) hours as needed for wheezing or shortness of breath.     amLODipine (NORVASC) 10 MG tablet Take 10 mg by mouth daily.      aspirin EC 81 MG tablet Take 81 mg by mouth daily.     Azelastine HCl 0.15 % SOLN Place 1 spray into both nostrils daily as needed for allergies.     budesonide (PULMICORT) 0.25 MG/2ML nebulizer solution Inhale 0.25 mg into the lungs as needed.     calcitRIOL (ROCALTROL) 0.25 MCG capsule Take 0.25 mcg by mouth daily.      fluticasone (FLONASE) 50 MCG/ACT nasal spray Place into both nostrils.     fluticasone furoate-vilanterol (BREO ELLIPTA) 200-25 MCG/INH AEPB Inhale 1 puff into the lungs daily as needed (respiratory issues.).      isosorbide mononitrate (IMDUR) 30 MG 24 hr tablet Take 0.5 tablets (15 mg total) by mouth daily. 45 tablet 3   metoprolol tartrate (LOPRESSOR) 50 MG tablet Take 1 tablet (50 mg total) by mouth 2 (two) times daily. 180 tablet 3   nitroGLYCERIN (NITROSTAT) 0.4 MG SL tablet Place 1 tablet (0.4 mg total) under the tongue every 5 (five) minutes as needed for chest pain. 25 tablet 1   omeprazole (PRILOSEC) 20 MG capsule Take 20 mg by mouth daily after breakfast.     predniSONE (DELTASONE) 5 MG tablet Take 5 mg by mouth 2 (two) times daily.     rosuvastatin (CRESTOR) 40 MG tablet Take 1 tablet (40 mg total) by mouth daily. 90 tablet 3   sennosides-docusate sodium (SENOKOT-S) 8.6-50 MG tablet Take 1 tablet by mouth daily as needed (constipation.).      tacrolimus (PROGRAF) 1 MG capsule Take 5 mg by mouth See admin instructions. Take 5 mg by mouth at 9 AM and 5 mg at 9 PM     Tiotropium Bromide Monohydrate 1.25 MCG/ACT AERS Inhale 1 puff into the lungs daily.     potassium chloride SA (KLOR-CON M20) 20 MEQ tablet Take 1 tablet (20 mEq total) by mouth daily. 30 tablet 11  No current facility-administered medications for this visit.    Allergies:   Sodium hypochlorite, Lactose, Asa arthritis  strength-antacid [aspirin buffered], Aspirin, Atorvastatin, Banana, Lactose intolerance (gi), and Lisinopril    Social History:  The patient  reports that she quit smoking about 37 years ago. Her smoking use included cigarettes. She has a 8.00 pack-year smoking history. She has never used smokeless tobacco. She reports that she does not drink alcohol and does not use drugs.   Family History:  The patient's family history includes Breast cancer (age of onset: 63) in her sister; Lung cancer in her brother; Prostate cancer in her brother.    ROS:  Please see the history of present illness.      Patient denies fever, chills, headache, sweats, rash, change in vision, change in hearing, chest pain, cough, nausea or vomiting, urinary symptoms. All other systems are reviewed and are negative.   Physical Exam: Blood pressure (!) 148/80, pulse 86, height '5\' 2"'$  (1.575 m), weight 153 lb (69.4 kg), SpO2 95 %.  GEN:  Well nourished, well developed in no acute distress HEENT: Normal NECK: No JVD; No carotid bruits LYMPHATICS: No lymphadenopathy CARDIAC: RRR , no murmurs, rubs, gallops RESPIRATORY:  Clear to auscultation without rales on left side.   Has blunted breathsound R base  ABDOMEN: Soft, non-tender, non-distended MUSCULOSKELETAL:  No edema; No deformity ,  left forearm dialysis fistula is functioning  SKIN: Warm and dry NEUROLOGIC:  Alert and oriented x 3  EKG:     Recent Labs: 02/28/2021: Hemoglobin 12.8; NT-Pro BNP 651; Platelets 148 03/09/2021: BUN 26; Creatinine, Ser 1.35; Potassium 3.6; Sodium 143 06/01/2021: ALT 10    Lipid Panel    Component Value Date/Time   CHOL 179 06/01/2021 0758   TRIG 108 06/01/2021 0758   HDL 72 06/01/2021 0758   CHOLHDL 2.5 06/01/2021 0758   CHOLHDL 2.6 02/14/2018 0223   VLDL 20 02/14/2018 0223   LDLCALC 88 06/01/2021 0758   LDLDIRECT 136.3 02/26/2012 1715      Wt Readings from Last 3 Encounters:  08/01/21 153 lb (69.4 kg)  05/12/21 160 lb (72.6  kg)  03/30/21 164 lb 3.2 oz (74.5 kg)      Current medicines are reviewed   Patient understands her medications well.     ASSESSMENT AND PLAN:  1. CAD - CABG , 2001 -  No angina   2. Shortness breath with exertion/fatigue:  She coughs when she swallows.   I'm concerned that she might be aspirating . She will ask Dr. Melford Aase to evaluate this further      3. ESRD - s/p renal transplant.    Has a functioning fistula in her left arm   4.  Hyperlipidemia:  Labs look great   5. DM  -  6.  HTN:  cont meds   Will see her in 1 year   Mertie Moores, MD  08/01/2021 9:21 AM    [address],  [place], [place]  [address] Pager [phone] Phone: [phone]; Fax: [phone]

## 2021-08-01 ENCOUNTER — Encounter: Payer: Self-pay | Admitting: Cardiovascular Disease

## 2021-08-01 ENCOUNTER — Ambulatory Visit: Payer: Medicare Other | Admitting: Cardiovascular Disease

## 2021-08-01 ENCOUNTER — Other Ambulatory Visit: Payer: Self-pay

## 2021-08-01 VITALS — BP 148/80 | HR 86 | Ht 62.0 in | Wt 153.0 lb

## 2021-08-01 DIAGNOSIS — Z951 Presence of aortocoronary bypass graft: Secondary | ICD-10-CM | POA: Diagnosis not present

## 2021-08-01 DIAGNOSIS — E781 Pure hyperglyceridemia: Secondary | ICD-10-CM | POA: Diagnosis not present

## 2021-08-01 DIAGNOSIS — I2510 Atherosclerotic heart disease of native coronary artery without angina pectoris: Secondary | ICD-10-CM

## 2021-08-01 NOTE — Patient Instructions (Signed)

## 2021-09-02 ENCOUNTER — Other Ambulatory Visit: Payer: Self-pay

## 2021-09-02 ENCOUNTER — Other Ambulatory Visit: Payer: Medicare Other | Admitting: *Deleted

## 2021-09-02 DIAGNOSIS — I25119 Atherosclerotic heart disease of native coronary artery with unspecified angina pectoris: Secondary | ICD-10-CM

## 2021-09-02 DIAGNOSIS — E785 Hyperlipidemia, unspecified: Secondary | ICD-10-CM

## 2021-09-02 LAB — LIPID PANEL
Chol/HDL Ratio: 4.4 ratio (ref 0.0–4.4)
Cholesterol, Total: 305 mg/dL — ABNORMAL HIGH (ref 100–199)
HDL: 69 mg/dL (ref >39)
LDL Chol Calc (NIH): 213 mg/dL — ABNORMAL HIGH (ref 0–99)
Triglycerides: 131 mg/dL (ref 0–149)
VLDL Cholesterol Cal: 23 mg/dL (ref 5–40)

## 2021-09-02 LAB — HEPATIC FUNCTION PANEL
ALT: 8 IU/L (ref 0–32)
AST: 14 IU/L (ref 0–40)
Albumin: 4.3 g/dL (ref 3.7–4.7)
Alkaline Phosphatase: 50 IU/L (ref 44–121)
Bilirubin Total: 0.4 mg/dL (ref 0.0–1.2)
Bilirubin, Direct: 0.1 mg/dL (ref 0.00–0.40)
Total Protein: 6.4 g/dL (ref 6.0–8.5)

## 2021-09-04 NOTE — Progress Notes (Signed)
Office Note     CC: Aneurysm left forearm AV graft Requesting Provider:  Chesley Noon, MD  HPI: [name] is a pleasant 80 y.o. 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  [name] was fortunate enough to receive a kidney transplant 10 years ago, which continues to function.  [name] has not required HD access since her transplant, but chose to keep the access years ago in case the kidney failed.  She is noted no changes at her AV graft site, and still appreciates a thrill.  The aneurysmal area has been present since the fistulous creation, and has not changed in size or color.  She denies symptoms consistent with steal syndrome.   The pt is  on a statin for cholesterol management.  The pt is  on a daily aspirin.   Other AC:  - The pt is  on medication for hypertension.   The pt is not diabetic.  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, hold ASA 2-3 weeks - consider plavix instead of ASA   Asthma    Cellulitis 12/19/2019   right upper arm   Coronary artery disease    cath 11/2007, grafts patent /  Nuclear, June, 2011, prior inferior MI with mild peri-infarct ischemia, anterior breast attenuation, EF 67%, done for renal transplant assessment / Low level exercise/Lexiscan Myoview (11/2013): EF 67%, no ischemi; normal study   Diabetes mellitus    type 2   ESRD on dialysis [facility])    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, 2012   Family history of breast cancer    Family history of prostate cancer    GERD (gastroesophageal reflux disease)    GI bleed 11/26/2010   January, 2012 , AVM   Gout    History of methicillin resistant staphylococcus aureus (MRSA)    Hyperlipidemia    Hyperparathyroidism    Hypertension    Myocardial infarction [facility])    Osteoporosis    Pneumonia 08/2010    Hospitalization,  October, 2011   Primary osteoarthritis, 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   S/P kidney transplant    September, 2012, [facility]   Subacromial impingement of 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    Past Surgical History:  Procedure Laterality Date   ABDOMINAL HYSTERECTOMY  1980'S   ARTERIOVENOUS GRAFT PLACEMENT Left 03/04/2007   forearm   BIOPSY  09/30/2019   Procedure: BIOPSY;  Surgeon: Wilford Corner, MD;  [facility];  Service: Endoscopy;;   BREAST EXCISIONAL BIOPSY Right    benign more than 10 yr ago   [place]  06/03/2002; 12/04/2007   COLONOSCOPY WITH PROPOFOL N/A 07/14/2014   Procedure: COLONOSCOPY WITH PROPOFOL;  Surgeon: Lear Ng, MD;  [facility];  Service: Endoscopy;  Laterality: N/A;   COLONOSCOPY WITH PROPOFOL N/A 09/30/2019   Procedure: COLONOSCOPY WITH PROPOFOL;  Surgeon: Wilford Corner, MD;  [facility];  Service: Endoscopy;  Laterality: N/A;   CORONARY ARTERY BYPASS GRAFT  06/06/2002   x 4   HOT HEMOSTASIS N/A 07/14/2014   Procedure: HOT HEMOSTASIS (ARGON PLASMA COAGULATION/BICAP);  Surgeon: Lear Ng, MD;  [facility];  Service: Endoscopy;  Laterality: N/A;   IR FLUORO GUIDE CV LINE RIGHT  12/22/2019   IR  US GUIDE VASC ACCESS RIGHT  12/22/2019   KIDNEY TRANSPLANT Right 08/04/2011   ORIF PATELLA Left 04/06/2016   Procedure: OPEN REDUCTION INTERNAL (ORIF) LEFT  PATELLA;  Surgeon: Ninetta Lights, MD;  Location: [place];  Service: Orthopedics;  Laterality: Left;   POLYPECTOMY  09/30/2019   Procedure: POLYPECTOMY;  Surgeon: Wilford Corner, MD;  [facility];  Service: Endoscopy;;   SHOULDER ARTHROSCOPY WITH ROTATOR CUFF REPAIR AND SUBACROMIAL DECOMPRESSION Left 08/03/2016   Procedure: LEFT SHOULDER ARTHROSCOPY WITH ROTATOR CUFF REPAIR AND SUBACROMIAL DECOMPRESSION with distal claviculectomy and extentsive debridement;  Surgeon: Ninetta Lights, MD;  Location: [place];  Service: Orthopedics;  Laterality: Left;  Block   THROMBECTOMY AND REVISION OF ARTERIOVENTOUS (AV) GORETEX  GRAFT Left 05/08/2007   forearm   TOTAL HIP ARTHROPLASTY  12/18/2012   Procedure: TOTAL HIP ARTHROPLASTY;  Surgeon: Ninetta Lights, MD;  Location: [place];  Service: Orthopedics;  Laterality: Left;   VESICOVAGINAL FISTULA CLOSURE W/ TAH  1984    Social History   Socioeconomic History   Marital status: Divorced    Spouse name: Not on file   Number of children: Not on file   Years of education: Not on file   Highest education level: Not on file  Occupational History   Not on file  Tobacco Use   Smoking status: Former    Packs/day: 1.00    Years: 8.00    Pack years: 8.00    Types: Cigarettes    Quit date: 11/21/1983    Years since quitting: 37.8   Smokeless tobacco: Never  Vaping Use   Vaping Use: Never used  Substance and Sexual Activity   Alcohol use: No   Drug use: No   Sexual activity: Not on file  Other Topics Concern   Not on file  Social History Narrative   Not on file   Social Determinants of Health   Financial Resource Strain: Not on file  Food Insecurity: Not on file  Transportation Needs: Not on file  Physical Activity: Not on file  Stress: Not on file  Social Connections: Not on file  Intimate Partner Violence: Not on file    Family History  Problem Relation Age of Onset   Breast cancer Sister 71   Prostate cancer Brother    Lung cancer Brother    Cancer Neg Hx     Current Outpatient Medications  Medication Sig Dispense Refill   acetaminophen (TYLENOL) 650 MG CR tablet Take 650 mg by mouth every morning.     albuterol (PROVENTIL) (2.5 MG/3ML) 0.083% nebulizer solution Take 2.5 mg by nebulization every 6 (six) hours as needed for wheezing or shortness of breath.     albuterol (VENTOLIN HFA) 108 (90 Base) MCG/ACT inhaler Inhale 1-2 puffs into the lungs every 6 (six) hours as needed for wheezing or  shortness of breath.     amLODipine (NORVASC) 10 MG tablet Take 10 mg by mouth daily.      aspirin EC 81 MG tablet Take 81 mg by mouth daily.     Azelastine HCl 0.15 % SOLN Place 1 spray into both nostrils daily as needed for allergies.     budesonide (PULMICORT) 0.25 MG/2ML nebulizer solution Inhale 0.25 mg into the lungs as needed.     calcitRIOL (ROCALTROL) 0.25 MCG capsule Take 0.25 mcg by mouth daily.      fluticasone (FLONASE) 50 MCG/ACT nasal spray Place into both nostrils.     fluticasone furoate-vilanterol (  BREO ELLIPTA) 200-25 MCG/INH AEPB Inhale 1 puff into the lungs daily as needed (respiratory issues.).      isosorbide mononitrate (IMDUR) 30 MG 24 hr tablet Take 0.5 tablets (15 mg total) by mouth daily. 45 tablet 3   metoprolol tartrate (LOPRESSOR) 50 MG tablet Take 1 tablet (50 mg total) by mouth 2 (two) times daily. 180 tablet 3   nitroGLYCERIN (NITROSTAT) 0.4 MG SL tablet Place 1 tablet (0.4 mg total) under the tongue every 5 (five) minutes as needed for chest pain. 25 tablet 1   omeprazole (PRILOSEC) 20 MG capsule Take 20 mg by mouth daily after breakfast.     potassium chloride SA (KLOR-CON M20) 20 MEQ tablet Take 1 tablet (20 mEq total) by mouth daily. 30 tablet 11   predniSONE (DELTASONE) 5 MG tablet Take 5 mg by mouth 2 (two) times daily.     rosuvastatin (CRESTOR) 40 MG tablet Take 1 tablet (40 mg total) by mouth daily. 90 tablet 3   sennosides-docusate sodium (SENOKOT-S) 8.6-50 MG tablet Take 1 tablet by mouth daily as needed (constipation.).      tacrolimus (PROGRAF) 1 MG capsule Take 5 mg by mouth See admin instructions. Take 5 mg by mouth at 9 AM and 5 mg at 9 PM     Tiotropium Bromide Monohydrate 1.25 MCG/ACT AERS Inhale 1 puff into the lungs daily.     No current facility-administered medications for this visit.    Allergies  Allergen Reactions   Sodium Hypochlorite Shortness Of Breath   Lactose Nausea And Vomiting   Asa Arthritis Strength-Antacid [Aspirin  Buffered] Nausea And Vomiting and Other (See Comments)    STOMACH BURNS, also   Aspirin Nausea And Vomiting    Per pt. "can tolerate the enteric coated tablets".    Atorvastatin Other (See Comments)    Patient's skin was skin was sensitive   Banana Nausea And Vomiting   Lactose Intolerance (Gi) Nausea And Vomiting   Lisinopril Cough     REVIEW OF SYSTEMS:   [X]  denotes positive finding, [ ]  denotes negative finding Cardiac  Comments:  Chest pain or chest pressure:    Shortness of breath upon exertion:    Short of breath when lying flat:    Irregular heart rhythm:        Vascular    Pain in calf, thigh, or hip brought on by ambulation:    Pain in feet at night that wakes you up from your sleep:     Blood clot in your veins:    Leg swelling:         Pulmonary    Oxygen at home:    Productive cough:     Wheezing:         Neurologic    Sudden weakness in arms or legs:     Sudden numbness in arms or legs:     Sudden onset of difficulty speaking or slurred speech:    Temporary loss of vision in one eye:     Problems with dizziness:         Gastrointestinal    Blood in stool:     Vomited blood:         Genitourinary    Burning when urinating:     Blood in urine:        Psychiatric    Major depression:         Hematologic    Bleeding problems:    Problems with blood clotting too easily:  Skin    Rashes or ulcers:        Constitutional    Fever or chills:      PHYSICAL EXAMINATION:  There were no vitals filed for this visit.  General:  WDWN in NAD; vital signs documented above Gait: Not observed HENT: WNL, normocephalic Pulmonary: normal non-labored breathing , without Rales, rhonchi,  wheezing Cardiac: regular HR Abdomen: soft, NT, no masses Skin: without rashes Vascular Exam/Pulses:  Right Left  Radial 2+ (normal) 2+ (normal)  Ulnar 2+ (normal) 2+ (normal)                  Excellent thrill in the fistula. Small aneurysm at an anastomosis  measuring roughly 1cmx1cm overlying skin healthy   Extremities: without ischemic changes, without Gangrene , without cellulitis; without open wounds;  Musculoskeletal: no muscle wasting or atrophy  Neurologic: A&O X 3;  No focal weakness or paresthesias are detected Psychiatric:  The pt has Normal affect.   Non-Invasive Vascular Imaging:   none    ASSESSMENT/PLAN::[name] is a 80 y.o. 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  Per Jarrett Soho, the aneurysm is unchanged over the last several years.  The overlying skin is healthy. She has no history of steal like symptoms in the left arm.  The aneurysm can be surveilled with observation. The fistula appears to be functioning well. [name] was asked to call our office if any concerns developed, including rapid growth, or dermal degradation above the aneurysm.    Broadus John, MD [facility] [phone]

## 2021-09-05 ENCOUNTER — Ambulatory Visit (INDEPENDENT_AMBULATORY_CARE_PROVIDER_SITE_OTHER): Payer: Medicare Other | Admitting: Vascular Surgery

## 2021-09-05 ENCOUNTER — Encounter: Payer: Self-pay | Admitting: Vascular Surgery

## 2021-09-05 ENCOUNTER — Other Ambulatory Visit: Payer: Self-pay

## 2021-09-05 VITALS — BP 128/73 | HR 81 | Temp 97.9°F | Resp 20 | Ht 62.0 in | Wt 153.0 lb

## 2021-09-05 DIAGNOSIS — T82590A Other mechanical complication of surgically created arteriovenous fistula, initial encounter: Secondary | ICD-10-CM | POA: Diagnosis not present

## 2021-09-06 ENCOUNTER — Other Ambulatory Visit: Payer: Self-pay | Admitting: *Deleted

## 2021-09-06 DIAGNOSIS — E785 Hyperlipidemia, unspecified: Secondary | ICD-10-CM

## 2021-09-07 ENCOUNTER — Other Ambulatory Visit: Payer: Self-pay | Admitting: *Deleted

## 2021-09-07 DIAGNOSIS — E785 Hyperlipidemia, unspecified: Secondary | ICD-10-CM

## 2021-09-07 MED ORDER — EZETIMIBE 10 MG PO TABS: 10.0000 mg | ORAL_TABLET | Freq: Every day | ORAL | 3 refills | Status: DC

## 2021-10-17 ENCOUNTER — Other Ambulatory Visit: Payer: Self-pay

## 2021-10-17 ENCOUNTER — Other Ambulatory Visit: Payer: Medicare Other | Admitting: *Deleted

## 2021-10-17 DIAGNOSIS — E785 Hyperlipidemia, unspecified: Secondary | ICD-10-CM

## 2021-10-17 LAB — LIPID PANEL
Chol/HDL Ratio: 2.1 ratio (ref 0.0–4.4)
Cholesterol, Total: 138 mg/dL (ref 100–199)
HDL: 65 mg/dL (ref >39)
LDL Chol Calc (NIH): 57 mg/dL (ref 0–99)
Triglycerides: 82 mg/dL (ref 0–149)
VLDL Cholesterol Cal: 16 mg/dL (ref 5–40)

## 2021-10-17 LAB — ALT: ALT: 7 IU/L (ref 0–32)

## 2021-10-20 ENCOUNTER — Encounter: Payer: Self-pay | Admitting: Internal Medicine

## 2021-10-20 ENCOUNTER — Ambulatory Visit (INDEPENDENT_AMBULATORY_CARE_PROVIDER_SITE_OTHER): Payer: Medicare Other | Admitting: Internal Medicine

## 2021-10-20 ENCOUNTER — Other Ambulatory Visit: Payer: Self-pay

## 2021-10-20 VITALS — BP 146/72 | HR 75 | Ht 62.0 in | Wt 148.6 lb

## 2021-10-20 DIAGNOSIS — I2510 Atherosclerotic heart disease of native coronary artery without angina pectoris: Secondary | ICD-10-CM | POA: Diagnosis not present

## 2021-10-20 DIAGNOSIS — Z951 Presence of aortocoronary bypass graft: Secondary | ICD-10-CM

## 2021-10-20 DIAGNOSIS — E785 Hyperlipidemia, unspecified: Secondary | ICD-10-CM | POA: Diagnosis not present

## 2021-10-20 DIAGNOSIS — Z940 Kidney transplant status: Secondary | ICD-10-CM

## 2021-10-20 DIAGNOSIS — J449 Chronic obstructive pulmonary disease, unspecified: Secondary | ICD-10-CM

## 2021-10-20 NOTE — Patient Instructions (Signed)
Medication Instructions:  NO CHANGES  *If you need a refill on your cardiac medications before your next appointment, please call your pharmacy*   Follow Up: At [facility], you and your health needs are our priority.  As part of our continuing mission to provide you with exceptional heart care, we have created designated Provider Care Teams.  These Care Teams include your primary Cardiologist (physician) and Advanced Practice Providers (APPs -  Physician Assistants and Nurse Practitioners) who all work together to provide you with the care you need, when you need it.  We recommend signing up for the patient portal called "MyChart".  Sign up information is provided on this After Visit Summary.  MyChart is used to connect with patients for Virtual Visits (Telemedicine).  Patients are able to view lab/test results, encounter notes, upcoming appointments, etc.  Non-urgent messages can be sent to your provider as well.   To learn more about what you can do with MyChart, go to [email or web address].    Your next appointment:    AS NEEDED with Dr. Hilty  

## 2021-10-20 NOTE — Progress Notes (Signed)
LIPID CLINIC CONSULT NOTE  Chief Complaint:  Dyslipidemia  Primary Care Physician: Chesley Noon, MD  Primary Cardiologist:  Mertie Moores, MD  HPI:  [name] is a 80 y.o. female who is being seen today for the evaluation of dyslipidemia at the request of Chesley Noon, MD. this is a pleasant 80 year old female with a history of COPD, end-stage renal disease status post renal transplant about 10 years ago, coronary artery disease status post CABG and prior inferior MI, who was referred for evaluation of dyslipidemia.  Looking back over the past year she has had generally good cholesterol control however about 7 months ago her cholesterol spiked up to 210 with an LDL of 109, and then about a month ago her total cholesterol went up to 305, triglycerides 131, HDL 69 and LDL 213.  She just had repeat labs about 3 days ago and now total cholesterol is 138, triglycerides 82, HDL 65 and LDL 57, indicating significant improvement and good control on combination rosuvastatin and ezetimibe.  We spent some time trying to figure out why her cholesterol had spike like that.  I suspect it is related to prednisone.  She has had some decrease in her tacrolimus however it seems like she has had a couple of respiratory infections requiring steroid boluses and it may have been around the time that she received some steroids that her cholesterol numbers increased significantly.  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, hold ASA 2-3 weeks - consider plavix instead of ASA   Asthma    Cellulitis 12/19/2019   right upper arm   Coronary artery disease    cath 11/2007, grafts patent /  Nuclear, June, 2011, prior inferior MI with mild peri-infarct ischemia, anterior breast attenuation, EF 67%, done for renal transplant assessment / Low level exercise/Lexiscan Myoview (11/2013): EF 67%, no ischemi; normal study   Diabetes  mellitus    type 2   ESRD on dialysis [facility])    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, 2012   Family history of breast cancer    Family history of prostate cancer    GERD (gastroesophageal reflux disease)    GI bleed 11/26/2010   January, 2012 , AVM   Gout    History of methicillin resistant staphylococcus aureus (MRSA)    Hyperlipidemia    Hyperparathyroidism    Hypertension    Myocardial infarction [facility])    Osteoporosis    Pneumonia 08/2010    Hospitalization, October, 2011   Primary osteoarthritis, 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   S/P kidney transplant    September, 2012, [facility]   Subacromial impingement of 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    Past Surgical History:  Procedure Laterality Date   ABDOMINAL HYSTERECTOMY  1980'S   ARTERIOVENOUS GRAFT PLACEMENT Left 03/04/2007   forearm   BIOPSY  09/30/2019   Procedure: BIOPSY;  Surgeon: Wilford Corner, MD;  [facility];  Service: Endoscopy;;   BREAST EXCISIONAL BIOPSY Right    benign more than 10 yr ago   [place]  06/03/2002; 12/04/2007   COLONOSCOPY WITH PROPOFOL N/A 07/14/2014   Procedure: COLONOSCOPY WITH PROPOFOL;  Surgeon: Lear Ng, MD;  [facility];  Service: Endoscopy;  Laterality: N/A;   COLONOSCOPY WITH PROPOFOL N/A 09/30/2019   Procedure: COLONOSCOPY WITH PROPOFOL;  Surgeon: Wilford Corner, MD;  [facility];  Service: Endoscopy;  Laterality: N/A;   CORONARY ARTERY BYPASS GRAFT  06/06/2002   x 4   HOT HEMOSTASIS N/A 07/14/2014   Procedure: HOT HEMOSTASIS (ARGON PLASMA COAGULATION/BICAP);  Surgeon: Lear Ng, MD;  Location: [facility];  Service: Endoscopy;  Laterality: N/A;   IR FLUORO GUIDE CV LINE RIGHT  12/22/2019   IR US GUIDE VASC ACCESS RIGHT  12/22/2019   KIDNEY TRANSPLANT Right 08/04/2011   ORIF PATELLA Left 04/06/2016   Procedure: OPEN REDUCTION INTERNAL (ORIF) LEFT  PATELLA;  Surgeon: Ninetta Lights, MD;  Location:  [place];  Service: Orthopedics;  Laterality: Left;   POLYPECTOMY  09/30/2019   Procedure: POLYPECTOMY;  Surgeon: Wilford Corner, MD;  [facility];  Service: Endoscopy;;   SHOULDER ARTHROSCOPY WITH ROTATOR CUFF REPAIR AND SUBACROMIAL DECOMPRESSION Left 08/03/2016   Procedure: LEFT SHOULDER ARTHROSCOPY WITH ROTATOR CUFF REPAIR AND SUBACROMIAL DECOMPRESSION with distal claviculectomy and extentsive debridement;  Surgeon: Ninetta Lights, MD;  Location: [place];  Service: Orthopedics;  Laterality: Left;  Block   THROMBECTOMY AND REVISION OF ARTERIOVENTOUS (AV) GORETEX  GRAFT Left 05/08/2007   forearm   TOTAL HIP ARTHROPLASTY  12/18/2012   Procedure: TOTAL HIP ARTHROPLASTY;  Surgeon: Ninetta Lights, MD;  Location: [place];  Service: Orthopedics;  Laterality: Left;   VESICOVAGINAL FISTULA CLOSURE W/ TAH  1984    FAMHx:  Family History  Problem Relation Age of Onset   Breast cancer Sister 31   Prostate cancer Brother    Lung cancer Brother    Cancer Neg Hx     SOCHx:   reports that she quit smoking about 37 years ago. Her smoking use included cigarettes. She has a 8.00 pack-year smoking history. She has never used smokeless tobacco. She reports that she does not drink alcohol and does not use drugs.  ALLERGIES:  Allergies  Allergen Reactions   Sodium Hypochlorite Shortness Of Breath   Lactose Nausea And Vomiting   Asa Arthritis Strength-Antacid [Aspirin Buffered] Nausea And Vomiting and Other (See Comments)    STOMACH BURNS, also   Aspirin Nausea And Vomiting    Per pt. "can tolerate the enteric coated tablets".    Atorvastatin Other (See Comments)    Patient's skin was skin was sensitive   Banana Nausea And Vomiting   Lactose Intolerance (Gi) Nausea And Vomiting   Lisinopril Cough    ROS: Pertinent items noted in HPI and remainder of comprehensive ROS otherwise negative.  HOME MEDS: Current Outpatient Medications on File Prior to  Visit  Medication Sig Dispense Refill   acetaminophen (TYLENOL) 650 MG CR tablet Take 650 mg by mouth every morning.     albuterol (PROVENTIL) (2.5 MG/3ML) 0.083% nebulizer solution Take 2.5 mg by nebulization every 6 (six) hours as needed for wheezing or shortness of breath.     albuterol (VENTOLIN HFA) 108 (90 Base) MCG/ACT inhaler Inhale 1-2 puffs into the lungs every 6 (six) hours as needed for wheezing or shortness of breath.     amLODipine (NORVASC) 10 MG tablet Take 10 mg by mouth daily.      aspirin EC 81 MG tablet Take 81 mg by mouth daily.     Azelastine HCl 0.15 % SOLN Place 1 spray into both nostrils daily as needed for allergies.     budesonide (PULMICORT) 0.25 MG/2ML nebulizer solution Inhale 0.25 mg into the lungs as needed.     calcitRIOL (ROCALTROL) 0.25 MCG capsule Take 0.25 mcg  by mouth daily.      ezetimibe (ZETIA) 10 MG tablet Take 1 tablet (10 mg total) by mouth daily. 90 tablet 3   fluticasone (FLONASE) 50 MCG/ACT nasal spray Place into both nostrils.     fluticasone furoate-vilanterol (BREO ELLIPTA) 200-25 MCG/INH AEPB Inhale 1 puff into the lungs daily as needed (respiratory issues.).      isosorbide mononitrate (IMDUR) 30 MG 24 hr tablet Take 0.5 tablets (15 mg total) by mouth daily. 45 tablet 3   metoprolol tartrate (LOPRESSOR) 50 MG tablet Take 1 tablet (50 mg total) by mouth 2 (two) times daily. 180 tablet 3   nitroGLYCERIN (NITROSTAT) 0.4 MG SL tablet Place 1 tablet (0.4 mg total) under the tongue every 5 (five) minutes as needed for chest pain. 25 tablet 1   omeprazole (PRILOSEC) 20 MG capsule Take 20 mg by mouth daily after breakfast.     predniSONE (DELTASONE) 5 MG tablet Take 5 mg by mouth 2 (two) times daily.     sennosides-docusate sodium (SENOKOT-S) 8.6-50 MG tablet Take 1 tablet by mouth daily as needed (constipation.).      tacrolimus (PROGRAF) 1 MG capsule Take 5 mg by mouth See admin instructions. Take 5 mg by mouth at 9 AM and 5 mg at 9 PM     Tiotropium  Bromide Monohydrate 1.25 MCG/ACT AERS Inhale 1 puff into the lungs daily.     potassium chloride SA (KLOR-CON M20) 20 MEQ tablet Take 1 tablet (20 mEq total) by mouth daily. 30 tablet 11   rosuvastatin (CRESTOR) 40 MG tablet Take 1 tablet (40 mg total) by mouth daily. 90 tablet 3   No current facility-administered medications on file prior to visit.    LABS/IMAGING: No results found for this or any previous visit (from the past 48 hour(s)). No results found.  LIPID PANEL:    Component Value Date/Time   CHOL 138 10/17/2021 0847   TRIG 82 10/17/2021 0847   HDL 65 10/17/2021 0847   CHOLHDL 2.1 10/17/2021 0847   CHOLHDL 2.6 02/14/2018 0223   VLDL 20 02/14/2018 0223   LDLCALC 57 10/17/2021 0847   LDLDIRECT 136.3 02/26/2012 1715    WEIGHTS: Wt Readings from Last 3 Encounters:  10/20/21 148 lb 9.6 oz (67.4 kg)  09/05/21 153 lb (69.4 kg)  08/01/21 153 lb (69.4 kg)    VITALS: BP (!) 146/72   Pulse 75   Ht 5\' 2"  (1.575 m)   Wt 148 lb 9.6 oz (67.4 kg)   SpO2 95%   BMI 27.18 kg/m   EXAM: General appearance: alert, no distress, and on oxygen Neck: no carotid bruit, no JVD, and thyroid not enlarged, symmetric, no tenderness/mass/nodules Lungs: diminished breath sounds bilaterally Heart: regular rate and rhythm Abdomen: soft, non-tender; bowel sounds normal; no masses,  no organomegaly Extremities: extremities normal, atraumatic, no cyanosis or edema Pulses: 2+ and symmetric Skin: Skin color, texture, turgor normal. No rashes or lesions Neurologic: Grossly normal Psych: Pleasant  EKG: Deferred  ASSESSMENT: Mixed dyslipidemia, goal LDL less than 70 Coronary artery disease status post CABG COPD ESRD status post renal transplant on immunosuppression  PLAN: 1.   [name] has had a recent spike in her cholesterol which is significant, I think related to steroids.  Labs just 3 days ago showed marked improvement in her lipids with LDL now down to 57.  She has been compliant  with her medication.  This is not likely due to dietary discretion.  I suspect medications were the cause  of her fluctuating cholesterol which was also noted to spike about 7 months ago.  There is little concern for the spikes as she continues to be well treated likely in the interim.  Follow-up with me as needed.  Pixie Casino, MD, [facility], [place] Director of the Advanced Lipid Disorders &  Cardiovascular Risk Reduction Clinic Diplomate of the American Board of Clinical Lipidology Attending Cardiologist  Direct Dial: [phone]  Fax: [phone]  Website:  [email or web address] 10/20/2021, 8:50 AM

## 2021-10-27 ENCOUNTER — Other Ambulatory Visit: Payer: Self-pay | Admitting: Gastroenterology

## 2021-10-27 DIAGNOSIS — R1310 Dysphagia, unspecified: Secondary | ICD-10-CM

## 2021-10-31 ENCOUNTER — Ambulatory Visit
Admission: RE | Admit: 2021-10-31 | Discharge: 2021-10-31 | Disposition: A | Discharge status: Home / Self Care | Payer: Medicare Other | Source: Ambulatory Visit | Attending: Gastroenterology | Admitting: Gastroenterology

## 2021-10-31 DIAGNOSIS — R1310 Dysphagia, unspecified: Secondary | ICD-10-CM

## 2021-11-03 ENCOUNTER — Other Ambulatory Visit: Payer: Self-pay

## 2021-11-03 ENCOUNTER — Ambulatory Visit (INDEPENDENT_AMBULATORY_CARE_PROVIDER_SITE_OTHER): Payer: Medicare Other | Admitting: Endocrinology

## 2021-11-03 VITALS — BP 160/76 | HR 87 | Ht 62.0 in | Wt 147.6 lb

## 2021-11-03 DIAGNOSIS — N1831 Chronic kidney disease, stage 3a: Secondary | ICD-10-CM

## 2021-11-03 DIAGNOSIS — Z780 Asymptomatic menopausal state: Secondary | ICD-10-CM | POA: Insufficient documentation

## 2021-11-03 DIAGNOSIS — E1122 Type 2 diabetes mellitus with diabetic chronic kidney disease: Secondary | ICD-10-CM

## 2021-11-03 DIAGNOSIS — Z794 Long term (current) use of insulin: Secondary | ICD-10-CM | POA: Diagnosis not present

## 2021-11-03 LAB — POCT GLYCOSYLATED HEMOGLOBIN (HGB A1C): Hemoglobin A1C: 6.5 % — AB (ref 4.0–5.6)

## 2021-11-03 NOTE — Progress Notes (Signed)
Subjective:    Patient ID: [name], female    DOB: 18-Feb-1941, 80 y.o.   MRN: [id number]  HPI Pt returns for f/u of diabetes mellitus: DM type: 2 Dx'ed: 7893 Complications: CAD, PAD (seen on CT), and renal failure (transplant 2013).   Therapy: no medication now GDM: never DKA: never Severe hypoglycemia: never Pancreatitis: never Pancreatic imaging: normal on 2015 CT Other: she has never been on insulin; she intermittently takes steroids for COPD; she requests generic meds only.    Interval history: pt says cbg's are approx 100.  pt states she feels well in general, except for chronic sob.   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, hold ASA 2-3 weeks - consider plavix instead of ASA   Asthma    Cellulitis 12/19/2019   right upper arm   Coronary artery disease    cath 11/2007, grafts patent /  Nuclear, June, 2011, prior inferior MI with mild peri-infarct ischemia, anterior breast attenuation, EF 67%, done for renal transplant assessment / Low level exercise/Lexiscan Myoview (11/2013): EF 67%, no ischemi; normal study   Diabetes mellitus    type 2   ESRD on dialysis [facility])    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, 2012   Family history of breast cancer    Family history of prostate cancer    GERD (gastroesophageal reflux disease)    GI bleed 11/26/2010   January, 2012 , AVM   Gout    History of methicillin resistant staphylococcus aureus (MRSA)    Hyperlipidemia    Hyperparathyroidism    Hypertension    Myocardial infarction [facility])    Osteoporosis    Pneumonia 08/2010    Hospitalization, October, 2011   Primary osteoarthritis, 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   S/P kidney transplant    September, 2012, [facility]   Subacromial impingement of 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    Past Surgical History:  Procedure Laterality Date   ABDOMINAL HYSTERECTOMY  1980'S   ARTERIOVENOUS GRAFT PLACEMENT Left 03/04/2007   forearm   BIOPSY   09/30/2019   Procedure: BIOPSY;  Surgeon: Wilford Corner, MD;  [facility];  Service: Endoscopy;;   BREAST EXCISIONAL BIOPSY Right    benign more than 10 yr ago   [place]  06/03/2002; 12/04/2007   COLONOSCOPY WITH PROPOFOL N/A 07/14/2014   Procedure: COLONOSCOPY WITH PROPOFOL;  Surgeon: Lear Ng, MD;  [facility];  Service: Endoscopy;  Laterality: N/A;   COLONOSCOPY WITH PROPOFOL N/A 09/30/2019   Procedure: COLONOSCOPY WITH PROPOFOL;  Surgeon: Wilford Corner, MD;  [facility];  Service: Endoscopy;  Laterality: N/A;   CORONARY ARTERY BYPASS GRAFT  06/06/2002   x 4   HOT HEMOSTASIS N/A 07/14/2014   Procedure: HOT HEMOSTASIS (ARGON PLASMA COAGULATION/BICAP);  Surgeon: Lear Ng, MD;  Location[facility];  Service: Endoscopy;  Laterality: N/A;   IR FLUORO GUIDE CV LINE RIGHT  12/22/2019   IR US GUIDE VASC ACCESS RIGHT  12/22/2019   KIDNEY TRANSPLANT Right 08/04/2011   ORIF PATELLA Left 04/06/2016   Procedure: OPEN REDUCTION INTERNAL (ORIF) LEFT  PATELLA;  Surgeon: Ninetta Lights, MD;  Location: [place];  Service: Orthopedics;  Laterality: Left;   POLYPECTOMY  09/30/2019   Procedure: POLYPECTOMY;  Surgeon: Wilford Corner, MD;  [facility];  Service: Endoscopy;;   SHOULDER ARTHROSCOPY WITH  ROTATOR CUFF REPAIR AND SUBACROMIAL DECOMPRESSION Left 08/03/2016   Procedure: LEFT SHOULDER ARTHROSCOPY WITH ROTATOR CUFF REPAIR AND SUBACROMIAL DECOMPRESSION with distal claviculectomy and extentsive debridement;  Surgeon: Ninetta Lights, MD;  Location: [place];  Service: Orthopedics;  Laterality: Left;  Block   THROMBECTOMY AND REVISION OF ARTERIOVENTOUS (AV) GORETEX  GRAFT Left 05/08/2007   forearm   TOTAL HIP ARTHROPLASTY  12/18/2012   Procedure: TOTAL HIP ARTHROPLASTY;  Surgeon: Ninetta Lights, MD;  Location: [place];  Service:  Orthopedics;  Laterality: Left;   VESICOVAGINAL FISTULA CLOSURE W/ TAH  1984    Social History   Socioeconomic History   Marital status: Divorced    Spouse name: Not on file   Number of children: Not on file   Years of education: Not on file   Highest education level: Not on file  Occupational History   Not on file  Tobacco Use   Smoking status: Former    Packs/day: 1.00    Years: 8.00    Pack years: 8.00    Types: Cigarettes    Quit date: 11/21/1983    Years since quitting: 37.9   Smokeless tobacco: Never  Vaping Use   Vaping Use: Never used  Substance and Sexual Activity   Alcohol use: No   Drug use: No   Sexual activity: Not on file  Other Topics Concern   Not on file  Social History Narrative   Not on file   Social Determinants of Health   Financial Resource Strain: Not on file  Food Insecurity: Not on file  Transportation Needs: Not on file  Physical Activity: Not on file  Stress: Not on file  Social Connections: Not on file  Intimate Partner Violence: Not on file    Current Outpatient Medications on File Prior to Visit  Medication Sig Dispense Refill   acetaminophen (TYLENOL) 650 MG CR tablet Take 650 mg by mouth every morning.     albuterol (PROVENTIL) (2.5 MG/3ML) 0.083% nebulizer solution Take 2.5 mg by nebulization every 6 (six) hours as needed for wheezing or shortness of breath.     albuterol (VENTOLIN HFA) 108 (90 Base) MCG/ACT inhaler Inhale 1-2 puffs into the lungs every 6 (six) hours as needed for wheezing or shortness of breath.     amLODipine (NORVASC) 10 MG tablet Take 10 mg by mouth daily.      aspirin EC 81 MG tablet Take 81 mg by mouth daily.     Azelastine HCl 0.15 % SOLN Place 1 spray into both nostrils daily as needed for allergies.     budesonide (PULMICORT) 0.25 MG/2ML nebulizer solution Inhale 0.25 mg into the lungs as needed.     calcitRIOL (ROCALTROL) 0.25 MCG capsule Take 0.25 mcg by mouth daily.      ezetimibe (ZETIA) 10 MG tablet  Take 1 tablet (10 mg total) by mouth daily. 90 tablet 3   fluticasone (FLONASE) 50 MCG/ACT nasal spray Place into both nostrils.     fluticasone furoate-vilanterol (BREO ELLIPTA) 200-25 MCG/INH AEPB Inhale 1 puff into the lungs daily as needed (respiratory issues.).      isosorbide mononitrate (IMDUR) 30 MG 24 hr tablet Take 0.5 tablets (15 mg total) by mouth daily. 45 tablet 3   metoprolol tartrate (LOPRESSOR) 50 MG tablet Take 1 tablet (50 mg total) by mouth 2 (two) times daily. 180 tablet 3   nitroGLYCERIN (NITROSTAT) 0.4 MG SL tablet Place 1 tablet (0.4 mg total) under the tongue every 5 (five)  minutes as needed for chest pain. 25 tablet 1   omeprazole (PRILOSEC) 20 MG capsule Take 20 mg by mouth daily after breakfast.     predniSONE (DELTASONE) 5 MG tablet Take 5 mg by mouth 2 (two) times daily.     sennosides-docusate sodium (SENOKOT-S) 8.6-50 MG tablet Take 1 tablet by mouth daily as needed (constipation.).      tacrolimus (PROGRAF) 1 MG capsule Take 5 mg by mouth See admin instructions. Take 5 mg by mouth at 9 AM and 5 mg at 9 PM     Tiotropium Bromide Monohydrate 1.25 MCG/ACT AERS Inhale 1 puff into the lungs daily.     potassium chloride SA (KLOR-CON M20) 20 MEQ tablet Take 1 tablet (20 mEq total) by mouth daily. 30 tablet 11   rosuvastatin (CRESTOR) 40 MG tablet Take 1 tablet (40 mg total) by mouth daily. 90 tablet 3   No current facility-administered medications on file prior to visit.    Allergies  Allergen Reactions   Sodium Hypochlorite Shortness Of Breath   Lactose Nausea And Vomiting   Asa Arthritis Strength-Antacid [Aspirin Buffered] Nausea And Vomiting and Other (See Comments)    STOMACH BURNS, also   Aspirin Nausea And Vomiting    Per pt. "can tolerate the enteric coated tablets".    Atorvastatin Other (See Comments)    Patient's skin was skin was sensitive   Banana Nausea And Vomiting   Lactose Intolerance (Gi) Nausea And Vomiting   Lisinopril Cough    Family  History  Problem Relation Age of Onset   Breast cancer Sister 64   Prostate cancer Brother    Lung cancer Brother    Cancer Neg Hx     BP (!) 160/76    Pulse 87    Ht 5\' 2"  (1.575 m)    Wt 147 lb 9.6 oz (67 kg)    SpO2 (!) 87%    BMI 27.00 kg/m    Review of Systems She has lost 13 lbs since last ov.      Objective:   Physical Exam VITAL SIGNS:  See vs page GENERAL: no distress.  Has 02 on.     Lab Results  Component Value Date   CREATININE 1.35 (H) 03/09/2021   BUN 26 03/09/2021   NA 143 03/09/2021   K 3.6 03/09/2021   CL 100 03/09/2021   CO2 26 03/09/2021       Assessment & Plan:  Type 2 DM: stable off rx. Chronic steroid use: check DEXA   Patient Instructions  Your blood pressure is high today.  Please see your primary care provider soon, to have it rechecked check your blood sugar once a week.  vary the time of day when you check, between before the 3 meals, and at bedtime.  also check if you have symptoms of your blood sugar being too high or too low.  please keep a record of the readings and bring it to your next appointment here (or you can bring the meter itself).  You can write it on any piece of paper.  please call us sooner if your blood sugar goes below 70, or if you have a lot of readings over 200.   No medication is needed for the diabetes now.  However, please call if the prednisone is increased.   Please call [phone] to schedule a Bone Density (DexaScan) a the [place] office at [place].  Please come back for a follow-up appointment in  6 months.

## 2021-11-03 NOTE — Patient Instructions (Addendum)
Your blood pressure is high today.  Please see your primary care provider soon, to have it rechecked check your blood sugar once a week.  vary the time of day when you check, between before the 3 meals, and at bedtime.  also check if you have symptoms of your blood sugar being too high or too low.  please keep a record of the readings and bring it to your next appointment here (or you can bring the meter itself).  You can write it on any piece of paper.  please call us sooner if your blood sugar goes below 70, or if you have a lot of readings over 200.   No medication is needed for the diabetes now.  However, please call if the prednisone is increased.   Please call [phone] to schedule a Bone Density (DexaScan) a the [place] office at [place].  Please come back for a follow-up appointment in 6 months.

## 2021-12-19 ENCOUNTER — Encounter (HOSPITAL_COMMUNITY): Payer: Self-pay | Admitting: Gastroenterology

## 2021-12-20 ENCOUNTER — Other Ambulatory Visit: Payer: Self-pay | Admitting: Gastroenterology

## 2021-12-27 ENCOUNTER — Ambulatory Visit (HOSPITAL_COMMUNITY): Payer: Medicare Other | Admitting: Anesthesiology

## 2021-12-27 ENCOUNTER — Ambulatory Visit (HOSPITAL_COMMUNITY)
Admission: RE | Admit: 2021-12-27 | Discharge: 2021-12-27 | Disposition: A | Discharge status: Home / Self Care | Payer: Medicare Other | Source: Ambulatory Visit | Attending: Gastroenterology | Admitting: Gastroenterology

## 2021-12-27 ENCOUNTER — Encounter (HOSPITAL_COMMUNITY)
Admission: RE | Disposition: A | Discharge status: Home / Self Care | Payer: Self-pay | Source: Ambulatory Visit | Attending: Gastroenterology

## 2021-12-27 ENCOUNTER — Other Ambulatory Visit: Payer: Self-pay

## 2021-12-27 ENCOUNTER — Encounter (HOSPITAL_COMMUNITY): Payer: Self-pay | Admitting: Gastroenterology

## 2021-12-27 DIAGNOSIS — Z86010 Personal history of colonic polyps: Secondary | ICD-10-CM | POA: Insufficient documentation

## 2021-12-27 DIAGNOSIS — K6389 Other specified diseases of intestine: Secondary | ICD-10-CM | POA: Diagnosis not present

## 2021-12-27 DIAGNOSIS — R634 Abnormal weight loss: Secondary | ICD-10-CM | POA: Insufficient documentation

## 2021-12-27 DIAGNOSIS — K5730 Diverticulosis of large intestine without perforation or abscess without bleeding: Secondary | ICD-10-CM | POA: Insufficient documentation

## 2021-12-27 DIAGNOSIS — D123 Benign neoplasm of transverse colon: Secondary | ICD-10-CM | POA: Diagnosis not present

## 2021-12-27 DIAGNOSIS — Z951 Presence of aortocoronary bypass graft: Secondary | ICD-10-CM | POA: Insufficient documentation

## 2021-12-27 DIAGNOSIS — K449 Diaphragmatic hernia without obstruction or gangrene: Secondary | ICD-10-CM | POA: Insufficient documentation

## 2021-12-27 DIAGNOSIS — K640 First degree hemorrhoids: Secondary | ICD-10-CM | POA: Insufficient documentation

## 2021-12-27 DIAGNOSIS — K3189 Other diseases of stomach and duodenum: Secondary | ICD-10-CM | POA: Diagnosis not present

## 2021-12-27 DIAGNOSIS — E119 Type 2 diabetes mellitus without complications: Secondary | ICD-10-CM | POA: Insufficient documentation

## 2021-12-27 DIAGNOSIS — K219 Gastro-esophageal reflux disease without esophagitis: Secondary | ICD-10-CM | POA: Diagnosis not present

## 2021-12-27 DIAGNOSIS — Z940 Kidney transplant status: Secondary | ICD-10-CM | POA: Insufficient documentation

## 2021-12-27 DIAGNOSIS — Z9981 Dependence on supplemental oxygen: Secondary | ICD-10-CM | POA: Diagnosis not present

## 2021-12-27 DIAGNOSIS — K2970 Gastritis, unspecified, without bleeding: Secondary | ICD-10-CM | POA: Diagnosis not present

## 2021-12-27 DIAGNOSIS — I10 Essential (primary) hypertension: Secondary | ICD-10-CM | POA: Diagnosis not present

## 2021-12-27 DIAGNOSIS — I2510 Atherosclerotic heart disease of native coronary artery without angina pectoris: Secondary | ICD-10-CM | POA: Insufficient documentation

## 2021-12-27 DIAGNOSIS — R1310 Dysphagia, unspecified: Secondary | ICD-10-CM | POA: Diagnosis not present

## 2021-12-27 DIAGNOSIS — Z87891 Personal history of nicotine dependence: Secondary | ICD-10-CM | POA: Insufficient documentation

## 2021-12-27 DIAGNOSIS — J449 Chronic obstructive pulmonary disease, unspecified: Secondary | ICD-10-CM | POA: Insufficient documentation

## 2021-12-27 HISTORY — PX: BIOPSY: SHX5522

## 2021-12-27 HISTORY — PX: POLYPECTOMY: SHX5525

## 2021-12-27 HISTORY — PX: COLONOSCOPY WITH PROPOFOL: SHX5780

## 2021-12-27 HISTORY — PX: HEMOSTASIS CLIP PLACEMENT: SHX6857

## 2021-12-27 HISTORY — PX: ESOPHAGOGASTRODUODENOSCOPY: SHX5428

## 2021-12-27 LAB — POCT I-STAT, CHEM 8
BUN: 11 mg/dL (ref 8–23)
Calcium, Ion: 1.2 mmol/L (ref 1.15–1.40)
Chloride: 106 mmol/L (ref 98–111)
Creatinine, Ser: 1.1 mg/dL — ABNORMAL HIGH (ref 0.44–1.00)
Glucose, Bld: 95 mg/dL (ref 70–99)
HCT: 40 % (ref 36.0–46.0)
Hemoglobin: 13.6 g/dL (ref 12.0–15.0)
Potassium: 3.3 mmol/L — ABNORMAL LOW (ref 3.5–5.1)
Sodium: 142 mmol/L (ref 135–145)
TCO2: 24 mmol/L (ref 22–32)

## 2021-12-27 LAB — GLUCOSE, CAPILLARY: Glucose-Capillary: 91 mg/dL (ref 70–99)

## 2021-12-27 SURGERY — COLONOSCOPY WITH PROPOFOL
Anesthesia: Monitor Anesthesia Care

## 2021-12-27 MED ORDER — SODIUM CHLORIDE 0.9 % IV SOLN: INTRAVENOUS | Status: DC

## 2021-12-27 MED ORDER — SODIUM CHLORIDE 0.9 % IV SOLN
INTRAVENOUS | Status: AC | PRN
  Administered 2021-12-27: 1000 mL via INTRAMUSCULAR

## 2021-12-27 MED ORDER — PROPOFOL 10 MG/ML IV BOLUS
INTRAVENOUS | Status: DC | PRN
  Administered 2021-12-27 (×2): 20 mg via INTRAVENOUS

## 2021-12-27 MED ORDER — PROPOFOL 1000 MG/100ML IV EMUL
INTRAVENOUS | Status: AC
  Filled 2021-12-27: qty 100

## 2021-12-27 MED ORDER — PROPOFOL 10 MG/ML IV BOLUS
INTRAVENOUS | Status: AC
  Filled 2021-12-27: qty 20

## 2021-12-27 MED ORDER — LACTATED RINGERS IV SOLN: INTRAVENOUS | Status: DC

## 2021-12-27 MED ORDER — PROPOFOL 500 MG/50ML IV EMUL
INTRAVENOUS | Status: DC | PRN
  Administered 2021-12-27: 125 ug/kg/min via INTRAVENOUS

## 2021-12-27 SURGICAL SUPPLY — 22 items

## 2021-12-27 NOTE — H&P (Signed)
Date of Initial H&P: 12/20/21  History reviewed, patient examined, no change in status, stable for surgery.

## 2021-12-27 NOTE — Op Note (Signed)
[facility] Patient Name: [name] Procedure Date: 12/27/2021 MRN: [id number] Attending MD: Lear Ng , MD Date of Birth: December 06, 1940 CSN: [id number] Age: 81 Admit Type: Outpatient Procedure:                Colonoscopy Indications:              High risk colon cancer surveillance: Personal                            history of colonic polyps, Last colonoscopy:                            January 2021 Providers:                Lear Ng, MD, Dulcy Fanny, Frazier Richards, Technician Referring MD:             Donetta Potts MD, MD Medicines:                Propofol per Anesthesia, Monitored Anesthesia Care Complications:            No immediate complications. Estimated Blood Loss:     Estimated blood loss was minimal. Procedure:                Pre-Anesthesia Assessment:                           - Prior to the procedure, a History and Physical                            was performed, and patient medications and                            allergies were reviewed. The patient's tolerance of                            previous anesthesia was also reviewed. The risks                            and benefits of the procedure and the sedation                            options and risks were discussed with the patient.                            All questions were answered, and informed consent                            was obtained. Prior Anticoagulants: The patient has                            taken no previous anticoagulant or antiplatelet  agents. ASA Grade Assessment: III - A patient with                            severe systemic disease. After reviewing the risks                            and benefits, the patient was deemed in                            satisfactory condition to undergo the procedure.                           After obtaining informed consent, the colonoscope                             was passed under direct vision. Throughout the                            procedure, the patient's blood pressure, pulse, and                            oxygen saturations were monitored continuously. The                            PCF-HQ190L ([id number]) Olympus colonoscope was                            introduced through the anus and advanced to the the                            cecum, identified by appendiceal orifice and                            ileocecal valve. The colonoscopy was somewhat                            difficult due to significant looping and a tortuous                            colon. Successful completion of the procedure was                            aided by straightening and shortening the scope to                            obtain bowel loop reduction. The patient tolerated                            the procedure fairly well. The quality of the bowel                            preparation was adequate and good. The ileocecal  valve, appendiceal orifice, and rectum were                            photographed. Scope In: 12:29:51 PM Scope Out: 12:54:00 PM Scope Withdrawal Time: 0 hours 20 minutes 1 second  Total Procedure Duration: 0 hours 24 minutes 9 seconds  Findings:      The perianal and digital rectal examinations were normal.      Two sessile polyps were found in the hepatic flexure. The polyps were 3       to 5 mm in size. These polyps were removed with a hot snare. Resection       and retrieval were complete. Estimated blood loss: none.      A 6 mm polyp was found in the hepatic flexure. The polyp was sessile.       The polyp was removed with a hot snare. Resection and retrieval were       complete. Estimated blood loss was minimal. To prevent bleeding after       the polypectomy, one hemostatic clip was successfully placed. There was       no bleeding at the end of the procedure.      Multiple small and  large-mouthed diverticula were found in the sigmoid       colon.      Internal hemorrhoids were found during retroflexion. The hemorrhoids       were medium-sized and Grade I (internal hemorrhoids that do not       prolapse).      A localized area of mildly nodular mucosa was found in the cecum.       Biopsies were taken with a cold forceps for histology. Estimated blood       loss was minimal. Impression:               - Two 3 to 5 mm polyps at the hepatic flexure,                            removed with a hot snare. Resected and retrieved.                           - One 6 mm polyp at the hepatic flexure, removed                            with a hot snare. Resected and retrieved. Clip was                            placed.                           - Diverticulosis in the sigmoid colon.                           - Internal hemorrhoids.                           - Nodular mucosa in the cecum. Biopsied. Moderate Sedation:      N/A - MAC procedure Recommendation:           - Patient has a contact number available for  emergencies. The signs and symptoms of potential                            delayed complications were discussed with the                            patient. Return to normal activities tomorrow.                            Written discharge instructions were provided to the                            patient.                           - High fiber diet.                           - Await pathology results.                           - Repeat colonoscopy for surveillance based on                            pathology results.                           - No ibuprofen, naproxen, or other non-steroidal                            anti-inflammatory drugs for 1 week. Procedure Code(s):        --- Professional ---                           [phone], Colonoscopy, flexible; with removal of                            tumor(s), polyp(s), or other lesion(s) by  snare                            technique                           45380, 44, Colonoscopy, flexible; with biopsy,                            single or multiple Diagnosis Code(s):        --- Professional ---                           Z86.010, Personal history of colonic polyps                           K63.5, Polyp of colon                           K64.0, First degree hemorrhoids  K63.89, Other specified diseases of intestine                           K57.30, Diverticulosis of large intestine without                            perforation or abscess without bleeding CPT copyright 2019 [employer]. All rights reserved. The codes documented in this report are preliminary and upon coder review may  be revised to meet current compliance requirements. Lear Ng, MD 12/27/2021 1:14:17 PM This report has been signed electronically. Number of Addenda: 0

## 2021-12-27 NOTE — Interval H&P Note (Signed)
History and Physical Interval Note:  12/27/2021 11:27 AM  [name]  has presented today for surgery, with the diagnosis of Dysphagia and hx of polyps.  The various methods of treatment have been discussed with the patient and family. After consideration of risks, benefits and other options for treatment, the patient has consented to  Procedure(s): COLONOSCOPY WITH PROPOFOL (N/A) ESOPHAGOGASTRODUODENOSCOPY (EGD) (N/A) BALLOON DILATION (N/A) as a surgical intervention.  The patient's history has been reviewed, patient examined, no change in status, stable for surgery.  I have reviewed the patient's chart and labs.  Questions were answered to the patient's satisfaction.     Lear Ng

## 2021-12-27 NOTE — Transfer of Care (Signed)
Immediate Anesthesia Transfer of Care Note  Patient: [name]  Procedure(s) Performed: COLONOSCOPY WITH PROPOFOL ESOPHAGOGASTRODUODENOSCOPY (EGD) BIOPSY POLYPECTOMY HEMOSTASIS CLIP PLACEMENT  Patient Location: PACU  Anesthesia Type:MAC  Level of Consciousness: sedated, patient cooperative and responds to stimulation  Airway & Oxygen Therapy: Patient Spontanous Breathing and Patient connected to face mask oxygen  Post-op Assessment: Report given to RN and Post -op Vital signs reviewed and stable  Post vital signs: Reviewed and stable  Last Vitals:  Vitals Value Taken Time  BP    Temp    Pulse 71 12/27/21 1300  Resp 15 12/27/21 1300  SpO2 100 % 12/27/21 1300  Vitals shown include unvalidated device data.  Last Pain:  Vitals:   12/27/21 1136  TempSrc: Oral  PainSc: 0-No pain         Complications: No notable events documented.

## 2021-12-27 NOTE — Anesthesia Preprocedure Evaluation (Addendum)
Anesthesia Evaluation  Patient identified by MRN, date of birth, ID band Patient awake    Reviewed: Allergy & Precautions, NPO status , Patient's Chart, lab work & pertinent test results  Airway Mallampati: II  TM Distance: >3 FB Neck ROM: Full    Dental  (+) Dental Advisory Given   Pulmonary shortness of breath, asthma , COPD,  oxygen dependent, former smoker,    breath sounds clear to auscultation       Cardiovascular hypertension, Pt. on medications and Pt. on home beta blockers + CAD and + CABG   Rhythm:Regular Rate:Normal     Neuro/Psych negative neurological ROS     GI/Hepatic Neg liver ROS, GERD  ,  Endo/Other  diabetes, Type 2  Renal/GU ESRFRenal diseaseS/p renal transplant. Not currently on dialysis     Musculoskeletal  (+) Arthritis ,   Abdominal   Peds  Hematology negative hematology ROS (+)   Anesthesia Other Findings   Reproductive/Obstetrics                            Anesthesia Physical Anesthesia Plan  ASA: 3  Anesthesia Plan: MAC   Post-op Pain Management: Minimal or no pain anticipated   Induction:   PONV Risk Score and Plan: 2 and Propofol infusion and Treatment may vary due to age or medical condition  Airway Management Planned: Natural Airway, Nasal Cannula and Simple Face Mask  Additional Equipment:   Intra-op Plan:   Post-operative Plan:   Informed Consent: I have reviewed the patients History and Physical, chart, labs and discussed the procedure including the risks, benefits and alternatives for the proposed anesthesia with the patient or authorized representative who has indicated his/her understanding and acceptance.       Plan Discussed with:   Anesthesia Plan Comments:         Anesthesia Quick Evaluation

## 2021-12-27 NOTE — Op Note (Signed)
[facility] Patient Name: [name] Procedure Date: 12/27/2021 MRN: [id number] Attending MD: Lear Ng , MD Date of Birth: [birth year] CSN: [id number] Age: 81 Admit Type: Outpatient Procedure:                Upper GI endoscopy Indications:              Dysphagia, Weight loss Providers:                Lear Ng, MD, Dulcy Fanny, Frazier Richards, Technician Referring MD:             Donetta Potts MD, MD Medicines:                Propofol per Anesthesia, Monitored Anesthesia Care Complications:            No immediate complications. Estimated Blood Loss:     Estimated blood loss was minimal. Procedure:                Pre-Anesthesia Assessment:                           - Prior to the procedure, a History and Physical                            was performed, and patient medications and                            allergies were reviewed. The patient's tolerance of                            previous anesthesia was also reviewed. The risks                            and benefits of the procedure and the sedation                            options and risks were discussed with the patient.                            All questions were answered, and informed consent                            was obtained. Prior Anticoagulants: The patient has                            taken no previous anticoagulant or antiplatelet                            agents. ASA Grade Assessment: III - A patient with                            severe systemic disease. After reviewing the risks  and benefits, the patient was deemed in                            satisfactory condition to undergo the procedure.                           After obtaining informed consent, the endoscope was                            passed under direct vision. Throughout the                            procedure, the patient's blood  pressure, pulse, and                            oxygen saturations were monitored continuously. The                            [id number]-[id number] ([id number]) Olympus endoscope was introduced                            through the mouth, and advanced to the second part                            of duodenum. The upper GI endoscopy was                            accomplished without difficulty. The patient                            tolerated the procedure well. Scope In: Scope Out: Findings:      The examined esophagus was normal.      The Z-line was regular and was found 38 cm from the incisors.      A medium-sized hiatal hernia was present.      Patchy moderate inflammation characterized by congestion (edema),       erosions and erythema was found in the gastric antrum. Biopsies were       taken with a cold forceps for histology. Estimated blood loss was       minimal.      The cardia and gastric fundus were normal on retroflexion.      Patchy nodular mucosa was found in the gastric antrum. Biopsies were       taken with a cold forceps for histology. Estimated blood loss was       minimal.      Patchy moderate mucosal changes characterized by congestion, erythema       and nodularity were found in the duodenal bulb.      The exam of the duodenum was otherwise normal. Impression:               - Normal esophagus.                           - Z-line regular, 38 cm from the incisors.                           -  Medium-sized hiatal hernia.                           - Gastritis. Biopsied.                           - Nodular mucosa in the gastric antrum. Biopsied.                           - Mucosal changes in the duodenum. Moderate Sedation:      Not Applicable - Patient had care per Anesthesia. Recommendation:           - Patient has a contact number available for                            emergencies. The signs and symptoms of potential                            delayed complications were  discussed with the                            patient. Return to normal activities tomorrow.                            Written discharge instructions were provided to the                            patient.                           - Resume previous diet.                           - Await pathology results. Procedure Code(s):        --- Professional ---                           [phone], Esophagogastroduodenoscopy, flexible,                            transoral; with biopsy, single or multiple Diagnosis Code(s):        --- Professional ---                           R13.10, Dysphagia, unspecified                           K29.70, Gastritis, unspecified, without bleeding                           K44.9, Diaphragmatic hernia without obstruction or                            gangrene                           K31.89, Other diseases of stomach and duodenum  R63.4, Abnormal weight loss CPT copyright 2019 [employer]. All rights reserved. The codes documented in this report are preliminary and upon coder review may  be revised to meet current compliance requirements. Lear Ng, MD 12/27/2021 1:20:13 PM This report has been signed electronically. Number of Addenda: 0

## 2021-12-27 NOTE — Discharge Instructions (Signed)

## 2021-12-28 ENCOUNTER — Encounter (HOSPITAL_COMMUNITY): Payer: Self-pay | Admitting: Gastroenterology

## 2021-12-28 LAB — SURGICAL PATHOLOGY

## 2021-12-28 NOTE — Anesthesia Postprocedure Evaluation (Signed)
Anesthesia Post Note  Patient: [name]  Procedure(s) Performed: COLONOSCOPY WITH PROPOFOL ESOPHAGOGASTRODUODENOSCOPY (EGD) BIOPSY POLYPECTOMY HEMOSTASIS CLIP PLACEMENT     Patient location during evaluation: PACU Anesthesia Type: MAC Level of consciousness: awake and alert Pain management: pain level controlled Vital Signs Assessment: post-procedure vital signs reviewed and stable Respiratory status: spontaneous breathing, nonlabored ventilation, respiratory function stable and patient connected to nasal cannula oxygen Cardiovascular status: stable and blood pressure returned to baseline Postop Assessment: no apparent nausea or vomiting Anesthetic complications: no   No notable events documented.  Last Vitals:  Vitals:   12/27/21 1302 12/27/21 1321  BP: (!) 158/53 (!) 142/91  Pulse: 73 71  Resp: (!) 22 13  Temp:    SpO2: 100% 92%    Last Pain:  Vitals:   12/27/21 1321  TempSrc:   PainSc: 0-No pain                 Tiajuana Amass

## 2022-02-22 ENCOUNTER — Encounter: Payer: Self-pay | Admitting: Endocrinology

## 2022-02-22 ENCOUNTER — Ambulatory Visit (INDEPENDENT_AMBULATORY_CARE_PROVIDER_SITE_OTHER): Payer: Medicare Other | Admitting: Endocrinology

## 2022-02-22 VITALS — BP 146/70 | HR 87 | Ht 62.0 in | Wt 142.4 lb

## 2022-02-22 DIAGNOSIS — E1122 Type 2 diabetes mellitus with diabetic chronic kidney disease: Secondary | ICD-10-CM

## 2022-02-22 DIAGNOSIS — N1831 Chronic kidney disease, stage 3a: Secondary | ICD-10-CM

## 2022-02-22 DIAGNOSIS — Z794 Long term (current) use of insulin: Secondary | ICD-10-CM

## 2022-02-22 LAB — POCT GLYCOSYLATED HEMOGLOBIN (HGB A1C): Hemoglobin A1C: 6.1 % — AB (ref 4.0–5.6)

## 2022-02-22 NOTE — Patient Instructions (Addendum)
Your blood pressure is high today.  Please see your primary care provider soon, to have it rechecked ?check your blood sugar once a week.  vary the time of day when you check, between before the 3 meals, and at bedtime.  also check if you have symptoms of your blood sugar being too high or too low.  please keep a record of the readings and bring it to your next appointment here (or you can bring the meter itself).  You can write it on any piece of paper.  please call us sooner if your blood sugar goes below 70, or if you have a lot of readings over 200.   ?No medication is needed for the diabetes now.    ?Please come back for a follow-up appointment in 6 months.   ?

## 2022-02-22 NOTE — Progress Notes (Signed)
? ?Subjective:  ? ? Patient ID: [name], female    DOB: Apr 23, 1941, 81 y.o.   MRN: [id number] ? ?HPI ?Pt returns for f/u of diabetes mellitus: ?DM type: 2 ?Dx'ed: 2013 ?Complications: CAD, PAD (seen on CT), and renal failure (transplant 2013).   ?Therapy: no medication now.   ?GDM: never ?DKA: never ?Severe hypoglycemia: never ?Pancreatitis: never ?Pancreatic imaging: normal on 2015 CT.   ?Other: she has never been on insulin; she intermittently takes steroids for COPD; she requests generic meds only.   ?Interval history: pt says cbg's are in the low- 100's.  pt states she feels well in general, except for chronic sob.  Prednisone is now 4x5 mg, BID.   ?Past Medical History:  ?Diagnosis Date  ? Anemia   ? 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 Asthma   ? Cellulitis 12/19/2019  ? right upper arm  ? Coronary artery disease   ? cath 11/2007, grafts patent /  Nuclear, June, 2011, prior inferior MI with mild peri-infarct ischemia, anterior breast attenuation, EF 67%, done for renal transplant assessment / Low level exercise/Lexiscan Myoview (11/2013): EF 67%, no ischemi; normal study  ? Diabetes mellitus   ? type 2  ? ESRD on dialysis [facility])   ? 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, 2012  ? Family history of breast cancer   ? Family history of prostate cancer   ? GERD (gastroesophageal reflux disease)   ? GI bleed 11/26/2010  ? January, 2012 , AVM  ? Gout   ? History of methicillin resistant staphylococcus aureus (MRSA)   ? Hyperlipidemia   ? Hyperparathyroidism   ? Hypertension   ? Myocardial infarction [facility])   ? Osteoporosis   ? Pneumonia 08/2010   ? Hospitalization, October, 2011  ? Primary osteoarthritis, 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  ? S/P kidney transplant   ? September, 2012, [facility]  ? Subacromial impingement of 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  ? ? ?Past Surgical History:  ?Procedure Laterality Date  ? ABDOMINAL HYSTERECTOMY  1980'S  ? ARTERIOVENOUS GRAFT PLACEMENT  Left 03/04/2007  ? forearm  ? BIOPSY  09/30/2019  ? Procedure: BIOPSY;  Surgeon: Wilford Corner, MD;  [facility];  Service: Endoscopy;;  ? BIOPSY  12/27/2021  ? Procedure: BIOPSY;  Surgeon: Wilford Corner, MD;  [facility];  Service: Endoscopy;;  ? BREAST EXCISIONAL BIOPSY Right   ? benign more than 10 yr ago  ? BREAST SURGERY  YRS AGO  ? RT BREAST CYST REMOVED   ? CARDIAC CATHETERIZATION  06/03/2002; 12/04/2007  ? COLONOSCOPY WITH PROPOFOL N/A 07/14/2014  ? Procedure: COLONOSCOPY WITH PROPOFOL;  Surgeon: Lear Ng, MD;  [facility];  Service: Endoscopy;  Laterality: N/A;  ? COLONOSCOPY WITH PROPOFOL N/A 09/30/2019  ? Procedure: COLONOSCOPY WITH PROPOFOL;  Surgeon: Wilford Corner, MD;  [facility];  Service: Endoscopy;  Laterality: N/A;  ? COLONOSCOPY WITH PROPOFOL N/A 12/27/2021  ? Procedure: COLONOSCOPY WITH PROPOFOL;  Surgeon: Wilford Corner, MD;  [facility];  Service: Endoscopy;  Laterality: N/A;  ? CORONARY ARTERY BYPASS GRAFT  06/06/2002  ? x 4  ? ESOPHAGOGASTRODUODENOSCOPY N/A 12/27/2021  ? Procedure: ESOPHAGOGASTRODUODENOSCOPY (EGD);  Surgeon: Wilford Corner, MD;  [facility];  Service: Endoscopy;  Laterality: N/A;  ? HEMOSTASIS CLIP PLACEMENT  12/27/2021  ? Procedure: HEMOSTASIS CLIP PLACEMENT;  Surgeon: Wilford Corner, MD;  [facility];  Service: Endoscopy;;  ? HOT HEMOSTASIS N/A  07/14/2014  ? Procedure: HOT HEMOSTASIS (ARGON PLASMA COAGULATION/BICAP);  Surgeon: Lear Ng, MD;  Location: [facility];  Service: Endoscopy;  Laterality: N/A;  ? IR FLUORO GUIDE CV LINE RIGHT  12/22/2019  ? IR US GUIDE VASC ACCESS RIGHT  12/22/2019  ? KIDNEY TRANSPLANT Right 08/04/2011  ? ORIF PATELLA Left 04/06/2016  ? Procedure: OPEN REDUCTION INTERNAL (ORIF) LEFT  PATELLA;  Surgeon: Ninetta Lights, MD;  Location: [place];  Service: Orthopedics;  Laterality: Left;  ? POLYPECTOMY  09/30/2019  ? Procedure:  POLYPECTOMY;  Surgeon: Wilford Corner, MD;  [facility];  Service: Endoscopy;;  ? POLYPECTOMY  12/27/2021  ? Procedure: POLYPECTOMY;  Surgeon: Wilford Corner, MD;  [facility];  Service: Endoscopy;;  ? SHOULDER ARTHROSCOPY WITH ROTATOR CUFF REPAIR AND SUBACROMIAL DECOMPRESSION Left 08/03/2016  ? Procedure: LEFT SHOULDER ARTHROSCOPY WITH ROTATOR CUFF REPAIR AND SUBACROMIAL DECOMPRESSION with distal claviculectomy and extentsive debridement;  Surgeon: Ninetta Lights, MD;  Location: [place];  Service: Orthopedics;  Laterality: Left;  Block  ? THROMBECTOMY AND REVISION OF ARTERIOVENTOUS (AV) GORETEX  GRAFT Left 05/08/2007  ? forearm  ? TOTAL HIP ARTHROPLASTY  12/18/2012  ? Procedure: TOTAL HIP ARTHROPLASTY;  Surgeon: Ninetta Lights, MD;  Location: [place];  Service: Orthopedics;  Laterality: Left;  ? VESICOVAGINAL FISTULA CLOSURE W/ TAH  1984  ? ? ?Social History  ? ?Socioeconomic History  ? Marital status: Divorced  ?  Spouse name: Not on file  ? Number of children: Not on file  ? Years of education: Not on file  ? Highest education level: Not on file  ?Occupational History  ? Not on file  ?Tobacco Use  ? Smoking status: Former  ?  Packs/day: 1.00  ?  Years: 8.00  ?  Pack years: 8.00  ?  Types: Cigarettes  ?  Quit date: 11/21/1983  ?  Years since quitting: 38.2  ? Smokeless tobacco: Never  ?Vaping Use  ? Vaping Use: Never used  ?Substance and Sexual Activity  ? Alcohol use: No  ? Drug use: No  ? Sexual activity: Not on file  ?Other Topics Concern  ? Not on file  ?Social History Narrative  ? Not on file  ? ?Social Determinants of Health  ? ?Financial Resource Strain: Not on file  ?Food Insecurity: Not on file  ?Transportation Needs: Not on file  ?Physical Activity: Not on file  ?Stress: Not on file  ?Social Connections: Not on file  ?Intimate Partner Violence: Not on file  ? ? ?Current Outpatient Medications on File Prior to Visit  ?Medication Sig Dispense Refill  ? acetaminophen  (TYLENOL) 650 MG CR tablet Take 650 mg by mouth every morning.    ? albuterol (PROVENTIL) (2.5 MG/3ML) 0.083% nebulizer solution Take 2.5 mg by nebulization every 6 (six) hours as needed for wheezing or shortness of breath.    ? albuterol (VENTOLIN HFA) 108 (90 Base) MCG/ACT inhaler Inhale 1-2 puffs into the lungs every 6 (six) hours as needed for wheezing or shortness of breath.    ? amLODipine (NORVASC) 10 MG tablet Take 10 mg by mouth daily.     ? aspirin EC 81 MG tablet Take 81 mg by mouth daily.    ? Azelastine HCl 0.15 % SOLN Place 1 spray into both nostrils daily as needed for allergies.    ? budesonide (PULMICORT) 0.25 MG/2ML nebulizer solution Inhale 0.25 mg into the lungs as needed.    ? calcitRIOL (ROCALTROL) 0.25  MCG capsule Take 0.25 mcg by mouth daily.     ? fluticasone (FLONASE) 50 MCG/ACT nasal spray Place into both nostrils.    ? fluticasone furoate-vilanterol (BREO ELLIPTA) 200-25 MCG/INH AEPB Inhale 1 puff into the lungs daily as needed (respiratory issues.).     ? isosorbide mononitrate (IMDUR) 30 MG 24 hr tablet Take 0.5 tablets (15 mg total) by mouth daily. 45 tablet 3  ? metoprolol tartrate (LOPRESSOR) 50 MG tablet Take 1 tablet (50 mg total) by mouth 2 (two) times daily. 180 tablet 3  ? nitroGLYCERIN (NITROSTAT) 0.4 MG SL tablet Place 1 tablet (0.4 mg total) under the tongue every 5 (five) minutes as needed for chest pain. 25 tablet 1  ? omeprazole (PRILOSEC) 20 MG capsule Take 20 mg by mouth daily after breakfast.    ? predniSONE (DELTASONE) 5 MG tablet Take 5 mg by mouth 2 (two) times daily.    ? sennosides-docusate sodium (SENOKOT-S) 8.6-50 MG tablet Take 1 tablet by mouth daily as needed (constipation.).     ? tacrolimus (PROGRAF) 1 MG capsule Take 5 mg by mouth See admin instructions. Take 5 mg by mouth at 9 AM and 5 mg at 9 PM    ? Tiotropium Bromide Monohydrate 1.25 MCG/ACT AERS Inhale 1 puff into the lungs daily.    ? ezetimibe (ZETIA) 10 MG tablet Take 1 tablet (10 mg total) by mouth  daily. 90 tablet 3  ? potassium chloride SA (KLOR-CON M20) 20 MEQ tablet Take 1 tablet (20 mEq total) by mouth daily. 30 tablet 11  ? rosuvastatin (CRESTOR) 40 MG tablet Take 1 tablet (40 mg total) by

## 2022-03-04 ENCOUNTER — Other Ambulatory Visit: Payer: Self-pay | Admitting: Physician Assistant

## 2022-03-24 ENCOUNTER — Encounter: Payer: Self-pay | Admitting: Radiology

## 2022-03-24 ENCOUNTER — Ambulatory Visit (INDEPENDENT_AMBULATORY_CARE_PROVIDER_SITE_OTHER)
Admission: RE | Admit: 2022-03-24 | Discharge: 2022-03-24 | Disposition: A | Discharge status: Home / Self Care | Payer: Medicare Other | Source: Ambulatory Visit | Attending: Endocrinology | Admitting: Endocrinology

## 2022-03-24 DIAGNOSIS — Z780 Asymptomatic menopausal state: Secondary | ICD-10-CM

## 2022-04-03 ENCOUNTER — Other Ambulatory Visit: Payer: Self-pay | Admitting: Family Medicine

## 2022-04-03 DIAGNOSIS — Z1231 Encounter for screening mammogram for malignant neoplasm of breast: Secondary | ICD-10-CM

## 2022-05-04 ENCOUNTER — Other Ambulatory Visit: Payer: Self-pay | Admitting: Physician Assistant

## 2022-05-15 ENCOUNTER — Ambulatory Visit
Admission: RE | Admit: 2022-05-15 | Discharge: 2022-05-15 | Disposition: A | Discharge status: Home / Self Care | Payer: Medicare Other | Source: Ambulatory Visit | Attending: Family Medicine | Admitting: Family Medicine

## 2022-05-15 DIAGNOSIS — Z1231 Encounter for screening mammogram for malignant neoplasm of breast: Secondary | ICD-10-CM

## 2022-05-18 ENCOUNTER — Ambulatory Visit: Payer: Medicare Other | Admitting: Endocrinology

## 2022-06-03 ENCOUNTER — Other Ambulatory Visit: Payer: Self-pay | Admitting: Physician Assistant

## 2022-07-10 ENCOUNTER — Ambulatory Visit: Payer: Medicare Other | Admitting: Internal Medicine

## 2022-07-10 ENCOUNTER — Encounter: Payer: Self-pay | Admitting: Internal Medicine

## 2022-07-10 VITALS — BP 122/70 | HR 70 | Ht 62.0 in | Wt 140.4 lb

## 2022-07-10 DIAGNOSIS — Z940 Kidney transplant status: Secondary | ICD-10-CM | POA: Insufficient documentation

## 2022-07-10 DIAGNOSIS — N1831 Chronic kidney disease, stage 3a: Secondary | ICD-10-CM | POA: Diagnosis not present

## 2022-07-10 DIAGNOSIS — Z794 Long term (current) use of insulin: Secondary | ICD-10-CM | POA: Diagnosis not present

## 2022-07-10 DIAGNOSIS — E1122 Type 2 diabetes mellitus with diabetic chronic kidney disease: Secondary | ICD-10-CM

## 2022-07-10 LAB — BASIC METABOLIC PANEL
BUN: 24 mg/dL — ABNORMAL HIGH (ref 6–23)
CO2: 25 mEq/L (ref 19–32)
Calcium: 9.4 mg/dL (ref 8.4–10.5)
Chloride: 105 mEq/L (ref 96–112)
Creatinine, Ser: 1.61 mg/dL — ABNORMAL HIGH (ref 0.40–1.20)
GFR: 30.27 mL/min — ABNORMAL LOW (ref >60.00)
Glucose, Bld: 126 mg/dL — ABNORMAL HIGH (ref 70–99)
Potassium: 3.5 mEq/L (ref 3.5–5.1)
Sodium: 141 mEq/L (ref 135–145)

## 2022-07-10 LAB — POCT GLUCOSE (DEVICE FOR HOME USE): Glucose Fasting, POC: 149 mg/dL — AB (ref 70–99)

## 2022-07-10 LAB — TSH: TSH: 2.07 u[IU]/mL (ref 0.35–5.50)

## 2022-07-10 LAB — POCT GLYCOSYLATED HEMOGLOBIN (HGB A1C): Hemoglobin A1C: 6.4 % — AB (ref 4.0–5.6)

## 2022-07-10 LAB — MICROALBUMIN / CREATININE URINE RATIO
Creatinine,U: 187.2 mg/dL
Microalb Creat Ratio: 102 mg/g — ABNORMAL HIGH (ref 0.0–30.0)
Microalb, Ur: 190.9 mg/dL — ABNORMAL HIGH (ref 0.0–1.9)

## 2022-07-10 LAB — T4, FREE: Free T4: 0.81 ng/dL (ref 0.60–1.60)

## 2022-07-10 MED ORDER — ONETOUCH VERIO VI STRP: 1.0000 | ORAL_STRIP | Freq: Every day | 3 refills | Status: DC

## 2022-07-10 NOTE — Progress Notes (Signed)
Name: [name]  Age/ Sex: 81 y.o., female   MRN/ DOB: [id number], June 04, 1941     PCP: [name] Noon, MD   Reason for Endocrinology Evaluation: Type 2 Diabetes Mellitus  Initial Endocrine Consultative Visit: 02/28/2013    PATIENT IDENTIFIER: [name] is a 81 y.o. female with a past medical history of T2DM, CAD, Hx of ESRD ( S/P transplant 2013) . The patient has followed with Endocrinology clinic since 02/28/2013 for consultative assistance with management of her diabetes.  DIABETIC HISTORY:  [name] was diagnosed with DM 2013.  She was on oral glycemic agents such as glipizide (discontinued in 2019) Her hemoglobin A1c has ranged from 5.5% in 2014, peaking at 6.5% in 2022.  Saw [name] last 02/2022    SUBJECTIVE:   During the last visit (02/22/2022): Saw [name]   Today (07/10/2022): [name] is here for a follow up on diabetes management .  She checks her blood sugars occasionally. 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, hold ASA 2-3 weeks - consider plavix instead of ASA   Asthma    Cellulitis 12/19/2019   right upper arm   Coronary artery disease    cath 11/2007, grafts patent /  Nuclear, June, 2011, prior inferior MI with mild peri-infarct ischemia, anterior breast attenuation, EF 67%, done for renal transplant assessment / Low level exercise/Lexiscan Myoview (11/2013): EF 67%, no ischemi;  normal study   Diabetes mellitus    type 2   ESRD on dialysis [facility])    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, 2012   Family history of breast cancer    Family history of prostate cancer    GERD (gastroesophageal reflux disease)    GI bleed 11/26/2010   January, 2012 , AVM   Gout    History of methicillin resistant staphylococcus aureus (MRSA)    Hyperlipidemia    Hyperparathyroidism    Hypertension    Myocardial infarction [facility])    Osteoporosis    Pneumonia 08/2010    Hospitalization, October, 2011   Primary osteoarthritis, 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   S/P kidney transplant    September, 2012, [facility]   Subacromial impingement of 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   Past Surgical History:  Past Surgical History:  Procedure Laterality Date   ABDOMINAL HYSTERECTOMY  1980'S   ARTERIOVENOUS GRAFT PLACEMENT Left 03/04/2007   forearm   BIOPSY  09/30/2019   Procedure: BIOPSY;  Surgeon: Wilford Corner, MD;  [facility];  Service: Endoscopy;;   BIOPSY  12/27/2021   Procedure: BIOPSY;  Surgeon: Wilford Corner, MD;  [facility];  Service: Endoscopy;;   BREAST EXCISIONAL BIOPSY Right    benign more than 10 yr ago   [place]  06/03/2002; 12/04/2007   COLONOSCOPY WITH PROPOFOL N/A 07/14/2014   Procedure: COLONOSCOPY WITH PROPOFOL;  Surgeon: Lear Ng, MD;  [facility];  Service: Endoscopy;  Laterality: N/A;   COLONOSCOPY WITH PROPOFOL N/A 09/30/2019   Procedure: COLONOSCOPY WITH PROPOFOL;  Surgeon: Wilford Corner, MD;  [facility];  Service: Endoscopy;  Laterality: N/A;   COLONOSCOPY WITH PROPOFOL N/A 12/27/2021   Procedure: COLONOSCOPY WITH PROPOFOL;  Surgeon: Wilford Corner, MD;  [facility];  Service: Endoscopy;  Laterality: N/A;   CORONARY ARTERY BYPASS GRAFT  06/06/2002   x 4   ESOPHAGOGASTRODUODENOSCOPY N/A 12/27/2021   Procedure: ESOPHAGOGASTRODUODENOSCOPY (EGD);  Surgeon:  Wilford Corner, MD;  Location: [facility];  Service: Endoscopy;  Laterality: N/A;   HEMOSTASIS CLIP PLACEMENT  12/27/2021   Procedure: HEMOSTASIS CLIP PLACEMENT;  Surgeon: Wilford Corner, MD;  [facility];  Service: Endoscopy;;   HOT HEMOSTASIS N/A 07/14/2014   Procedure: HOT HEMOSTASIS (ARGON PLASMA COAGULATION/BICAP);  Surgeon: Lear Ng, MD;  Location: [facility];  Service: Endoscopy;  Laterality: N/A;   IR FLUORO GUIDE CV LINE RIGHT  12/22/2019   IR US GUIDE VASC ACCESS RIGHT  12/22/2019   KIDNEY TRANSPLANT Right 08/04/2011   ORIF PATELLA Left 04/06/2016   Procedure: OPEN REDUCTION INTERNAL (ORIF) LEFT  PATELLA;  Surgeon: Ninetta Lights, MD;  Location: [place];  Service: Orthopedics;  Laterality: Left;   POLYPECTOMY  09/30/2019   Procedure: POLYPECTOMY;  Surgeon: Wilford Corner, MD;  [facility];  Service: Endoscopy;;   POLYPECTOMY  12/27/2021   Procedure: POLYPECTOMY;  Surgeon: Wilford Corner, MD;  [facility];  Service: Endoscopy;;   SHOULDER ARTHROSCOPY WITH ROTATOR CUFF REPAIR AND SUBACROMIAL DECOMPRESSION Left 08/03/2016   Procedure: LEFT SHOULDER ARTHROSCOPY WITH ROTATOR CUFF REPAIR AND SUBACROMIAL DECOMPRESSION with distal claviculectomy and extentsive debridement;  Surgeon: Ninetta Lights, MD;  Location: [place];  Service: Orthopedics;  Laterality: Left;  Block   THROMBECTOMY AND REVISION OF ARTERIOVENTOUS (AV) GORETEX  GRAFT Left 05/08/2007   forearm   TOTAL HIP ARTHROPLASTY  12/18/2012   Procedure: TOTAL HIP ARTHROPLASTY;  Surgeon: Ninetta Lights, MD;  Location: [place];  Service: Orthopedics;  Laterality: Left;   VESICOVAGINAL FISTULA CLOSURE W/ TAH  1984   Social History:  reports that she quit smoking about 38 years ago. Her smoking use included cigarettes. She has a 8.00 pack-year smoking history. She has never used smokeless tobacco. She reports that she does not drink alcohol and does not use  drugs. Family History:  Family History  Problem Relation Age of Onset   Breast cancer Sister 5   Prostate cancer Brother    Lung cancer Brother    Cancer Neg Hx      HOME MEDICATIONS: Allergies as of 07/10/2022       Reactions   Sodium Hypochlorite Shortness Of Breath   Lactose Nausea And Vomiting   Asa Arthritis Strength-antacid [aspirin Buffered] Nausea And Vomiting, Other (See Comments)   STOMACH BURNS, also   Aspirin Nausea And Vomiting   Per pt. "can tolerate the enteric coated tablets".    Atorvastatin Other (See Comments)   Patient's skin was skin was sensitive   Banana Nausea And Vomiting   Lactose Intolerance (gi) Nausea And Vomiting   Lisinopril Cough        Medication List        Accurate as of July 10, 2022  3:57 PM. If you have any questions, ask your nurse or doctor.          acetaminophen 650 MG CR tablet Commonly known as: TYLENOL Take 650 mg  by mouth every morning.   albuterol (2.5 MG/3ML) 0.083% nebulizer solution Commonly known as: PROVENTIL Take 2.5 mg by nebulization every 6 (six) hours as needed for wheezing or shortness of breath.   albuterol 108 (90 Base) MCG/ACT inhaler Commonly known as: VENTOLIN HFA Inhale 1-2 puffs into the lungs every 6 (six) hours as needed for wheezing or shortness of breath.   amLODipine 10 MG tablet Commonly known as: NORVASC Take 10 mg by mouth daily.   aspirin EC 81 MG tablet Take 81 mg by mouth daily.   Azelastine HCl 0.15 % Soln Place 1 spray into both nostrils daily as needed for allergies.   budesonide 0.25 MG/2ML nebulizer solution Commonly known as: PULMICORT Inhale 0.25 mg into the lungs as needed.   calcitRIOL 0.25 MCG capsule Commonly known as: ROCALTROL Take 0.25 mcg by mouth daily.   ezetimibe 10 MG tablet Commonly known as: ZETIA Take 1 tablet (10 mg total) by mouth daily.   fluticasone 50 MCG/ACT nasal spray Commonly known as: FLONASE Place into both nostrils.   fluticasone  furoate-vilanterol 200-25 MCG/INH Aepb Commonly known as: BREO ELLIPTA Inhale 1 puff into the lungs daily as needed (respiratory issues.).   isosorbide mononitrate 30 MG 24 hr tablet Commonly known as: IMDUR TAKE 1 TABLET BY MOUTH EVERY DAY   Klor-Con M20 20 MEQ tablet Generic drug: potassium chloride SA TAKE 1 TABLET BY MOUTH EVERY DAY   metoprolol tartrate 50 MG tablet Commonly known as: LOPRESSOR TAKE 1 TABLET BY MOUTH TWICE A DAY   nitroGLYCERIN 0.4 MG SL tablet Commonly known as: NITROSTAT Place 1 tablet (0.4 mg total) under the tongue every 5 (five) minutes as needed for chest pain.   omeprazole 20 MG capsule Commonly known as: PRILOSEC Take 20 mg by mouth daily after breakfast.   OneTouch Verio test strip Generic drug: glucose blood 1 each by Other route daily in the afternoon. Use as instructed Started by: Dorita Sciara, MD   predniSONE 5 MG tablet Commonly known as: DELTASONE Take 5 mg by mouth 2 (two) times daily.   rosuvastatin 40 MG tablet Commonly known as: CRESTOR TAKE 1 TABLET BY MOUTH EVERY DAY   sennosides-docusate sodium 8.6-50 MG tablet Commonly known as: SENOKOT-S Take 1 tablet by mouth daily as needed (constipation.).   tacrolimus 1 MG capsule Commonly known as: PROGRAF Take 5 mg by mouth See admin instructions. Take 5 mg by mouth at 9 AM and 5 mg at 9 PM   Tiotropium Bromide Monohydrate 1.25 MCG/ACT Aers Inhale 1 puff into the lungs daily.         OBJECTIVE:   Vital Signs: BP 122/70 (BP Location: Left Arm, Patient Position: Sitting, Cuff Size: Small)   Pulse 70   Ht '5\' 2"'$  (1.575 m)   Wt 140 lb 6.4 oz (63.7 kg)   LMP 03/04/2022   SpO2 94%   BMI 25.68 kg/m   Wt Readings from Last 3 Encounters:  07/10/22 140 lb 6.4 oz (63.7 kg)  02/22/22 142 lb 6.4 oz (64.6 kg)  12/27/21 140 lb (63.5 kg)     Exam: General: Pt appears well and is in NAD  Neck: General: Supple without adenopathy. Thyroid: Thyroid size normal.  No goiter  or nodules appreciated.  Lungs: Clear with good BS bilat with no rales, rhonchi, or wheezes  Heart: RRR with normal S1 and S2 and no gallops; no murmurs; no rub  Extremities: No pretibial edema.   Neuro: MS is good with appropriate affect,  pt is alert and Ox3    DM foot exam: 07/10/2022  The skin of the feet is intact without sores or ulcerations. The pedal pulses are 1+ on right and 2\1+ on left. The sensation is intact to a screening 5.07, 10 gram monofilament bilaterally          DATA REVIEWED:  Lab Results  Component Value Date   HGBA1C 6.4 (A) 07/10/2022   HGBA1C 6.1 (A) 02/22/2022   HGBA1C 6.5 (A) 11/03/2021    Latest Reference Range & Units 07/10/22 10:35  Sodium 135 - 145 mEq/L 141  Potassium 3.5 - 5.1 mEq/L 3.5  Chloride 96 - 112 mEq/L 105  CO2 19 - 32 mEq/L 25  Glucose 70 - 99 mg/dL 126 (H)  BUN 6 - 23 mg/dL 24 (H)  Creatinine 0.40 - 1.20 mg/dL 1.61 (H)  Calcium 8.4 - 10.5 mg/dL 9.4  GFR >60.00 mL/min 30.27 (L)     Latest Reference Range & Units 07/10/22 10:35  MICROALB/CREAT RATIO 0.0 - 30.0 mg/g 102.0 (H)        Latest Reference Range & Units Most Recent  TSH 0.35 - 5.50 uIU/mL 2.07 07/10/22 10:35      ASSESSMENT / PLAN / RECOMMENDATIONS:   1) Type 2 Diabetes Mellitus, Optimally controlled, With CKD III (S/P renal transplant) and macrovascular  complications - Most recent A1c of 6.4 %. Goal A1c < 7.0 %.     -Her A1c is acceptable but I suspect that it is skewed due to CKD -She is to be on glipizide which was discontinued in 2019 -I have encouraged the patient to check glucose at home 2-3 times a week to confirm optimization of glucose control -She was giving a new glucose meter One Touch Verio with extra strips -She was advised to avoid sugar sweetened beverages -BMP shows a GFR of 30 and microalbuminuria  MEDICATIONS: N/a  EDUCATION / INSTRUCTIONS: BG monitoring instructions: Patient is instructed to check her blood sugars 2-3 times a  week.   2) Diabetic complications:  Eye: Does not have known diabetic retinopathy.  Neuro/ Feet: Does not have known diabetic peripheral neuropathy .  Renal: Patient does  have known baseline CKD. She   is not on an ACEI/ARB at present.  She is s/p P renal transplant, follows with nephrology   3)CKD III:  -GFR has trended down over the past year, patient with microalbuminuria -We will defer further management to nephrology  F/U in 6 months     Signed electronically by: Mack Guise, MD  [facility]  [facility] [address] [place], [place] [address] Phone: [phone] FAX: [phone]   CC: [name] Noon, MD [place] [address] Phone: [phone]  Fax: [phone]  Return to Endocrinology clinic as below: Future Appointments  Date Time Provider [place]  01/01/2023  9:50 AM Stepahnie Campo, Melanie Crazier, MD [facility] None

## 2022-08-02 ENCOUNTER — Encounter (HOSPITAL_COMMUNITY): Payer: Self-pay

## 2022-08-04 ENCOUNTER — Other Ambulatory Visit: Payer: Self-pay | Admitting: Cardiovascular Disease

## 2022-08-10 ENCOUNTER — Telehealth (HOSPITAL_COMMUNITY): Payer: Self-pay | Admitting: *Deleted

## 2022-08-10 NOTE — Telephone Encounter (Signed)
Discussed with Programmer, systems, arrival, and plan for Pulm Rehab orientation. She plans to use valet. Yves Dill BS, ACSM-CEP 08/10/2022 9:48 AM

## 2022-08-11 ENCOUNTER — Encounter (HOSPITAL_COMMUNITY): Payer: Self-pay

## 2022-08-11 ENCOUNTER — Encounter (HOSPITAL_COMMUNITY)
Admission: RE | Admit: 2022-08-11 | Discharge: 2022-08-11 | Disposition: A | Discharge status: Home / Self Care | Payer: Medicare Other | Source: Ambulatory Visit | Attending: Pulmonary Disease | Admitting: Pulmonary Disease

## 2022-08-11 VITALS — BP 140/60 | HR 85 | Ht 62.0 in | Wt 140.2 lb

## 2022-08-11 DIAGNOSIS — E119 Type 2 diabetes mellitus without complications: Secondary | ICD-10-CM | POA: Diagnosis not present

## 2022-08-11 DIAGNOSIS — J4550 Severe persistent asthma, uncomplicated: Secondary | ICD-10-CM | POA: Insufficient documentation

## 2022-08-11 LAB — GLUCOSE, CAPILLARY: Glucose-Capillary: 109 mg/dL — ABNORMAL HIGH (ref 70–99)

## 2022-08-11 NOTE — Progress Notes (Signed)
Rehab Orientation Physical Assessment Note: Physical Assessment reveals patient is alert and orient x3. Heart rate regular, bilateral posterior breath sounds assessed, no wheezes, rales, or rhonchi, diminished to left upper and lower base. Patient denies any current productive cough. Has a good appetite. No reports of nausea, vomiting or diarrhea. Grip strength equal and strong. No swelling of the lower extremities. Warm and dry to the touch.

## 2022-08-11 NOTE — Progress Notes (Signed)
[name] 81 y.o. female Pulmonary Rehab Orientation Note This patient who was referred to Pulmonary Rehab by Dr. Blythe Stanford [name] coverage) with the diagnosis of persistent severe asthma arrived today in Cardiac and Pulmonary Rehab. She  arrived in wheelchair with weak gait. She  does carry portable oxygen. Pt does not know the oxygen provider.  Per patient, [name] uses oxygen continuously. Color good, skin warm and dry. Patient is oriented to time and place. Patient's medical history, psychosocial health, and medications reviewed. Psychosocial assessment reveals patient lives with alone. [name] is currently retired. Patient hobbies include  growing flowers . Patient reports her stress level is moderate. Areas of stress/anxiety include health. Patient does exhibit signs of depression. Signs of depression include helplessness and fatigue. PHQ2/9 score 2/7. [name] shows fair  coping skills with positive outlook on life. Offered emotional support and reassurance. Will continue to monitor and evaluate progress toward psychosocial goal(s) of decreasing depression and increasing quality of life. Physical assessment performed by [name] Dredge RN. Please see their orientation physical assessment note. [name] reports she does take medications as prescribed. Patient states she follows a regular  diet. The patient reports no specific efforts to gain or lose weight.. Patient's weight will be monitored closely. Demonstration and practice of PLB using pulse oximeter. [name] able to return demonstration satisfactorily. Safety and hand hygiene in the exercise area reviewed with patient. [name] voices understanding of the information reviewed. Department expectations discussed with patient and achievable goals were set. The patient shows enthusiasm about attending the program and we look forward to working with [name]. [name] completed a 6 min walk test today and is scheduled to begin exercise on 08/17/22 at 1015.    1330-1430 [name] Meigs, MS, ACSM-CEP

## 2022-08-11 NOTE — Progress Notes (Signed)
Pulmonary Individual Treatment Plan  Patient Details  Name: [name] MRN: [id number] Date of Birth: 09/16/1941 Referring Provider:   [name] Pulmonary Rehab Walk Test from 08/11/2022 in [place]  Referring Provider [name]       Initial Encounter Date:  Flowsheet Row Pulmonary Rehab Walk Test from 08/11/2022 in [place]  Date 08/11/22       Visit Diagnosis: Severe persistent asthma without complication  Patient's Home Medications on Admission:   Current Outpatient Medications:    acetaminophen (TYLENOL) 650 MG CR tablet, Take 650 mg by mouth every morning., Disp: , Rfl:    albuterol (PROVENTIL) (2.5 MG/3ML) 0.083% nebulizer solution, Take 2.5 mg by nebulization every 6 (six) hours as needed for wheezing or shortness of breath., Disp: , Rfl:    albuterol (VENTOLIN HFA) 108 (90 Base) MCG/ACT inhaler, Inhale 1-2 puffs into the lungs every 6 (six) hours as needed for wheezing or shortness of breath., Disp: , Rfl:    amLODipine (NORVASC) 10 MG tablet, Take 10 mg by mouth daily. , Disp: , Rfl:    aspirin EC 81 MG tablet, Take 81 mg by mouth daily., Disp: , Rfl:    Azelastine HCl 0.15 % SOLN, Place 1 spray into both nostrils daily as needed for allergies., Disp: , Rfl:    budesonide (PULMICORT) 0.25 MG/2ML nebulizer solution, Inhale 0.25 mg into the lungs as needed., Disp: , Rfl:    calcitRIOL (ROCALTROL) 0.25 MCG capsule, Take 0.25 mcg by mouth daily. , Disp: , Rfl:    ezetimibe (ZETIA) 10 MG tablet, Take 1 tablet (10 mg total) by mouth daily., Disp: 90 tablet, Rfl: 3   fluticasone (FLONASE) 50 MCG/ACT nasal spray, Place into both nostrils., Disp: , Rfl:    fluticasone furoate-vilanterol (BREO ELLIPTA) 200-25 MCG/INH AEPB, Inhale 1 puff into the lungs daily as needed (respiratory issues.). , Disp: , Rfl:    glucose blood (ONETOUCH VERIO) test strip, 1 each by Other route daily in the afternoon. Use as  instructed, Disp: 100 each, Rfl: 3   isosorbide mononitrate (IMDUR) 30 MG 24 hr tablet, TAKE 1 TABLET BY MOUTH EVERY DAY, Disp: 90 tablet, Rfl: 2   KLOR-CON M20 20 MEQ tablet, TAKE 1 TABLET BY MOUTH EVERY DAY, Disp: 90 tablet, Rfl: 2   metoprolol tartrate (LOPRESSOR) 50 MG tablet, TAKE 1 TABLET BY MOUTH TWICE A DAY, Disp: 180 tablet, Rfl: 0   nitroGLYCERIN (NITROSTAT) 0.4 MG SL tablet, Place 1 tablet (0.4 mg total) under the tongue every 5 (five) minutes as needed for chest pain., Disp: 25 tablet, Rfl: 1   omeprazole (PRILOSEC) 20 MG capsule, Take 20 mg by mouth daily after breakfast., Disp: , Rfl:    predniSONE (DELTASONE) 5 MG tablet, Take 5 mg by mouth 2 (two) times daily., Disp: , Rfl:    rosuvastatin (CRESTOR) 40 MG tablet, TAKE 1 TABLET BY MOUTH EVERY DAY, Disp: 90 tablet, Rfl: 0   sennosides-docusate sodium (SENOKOT-S) 8.6-50 MG tablet, Take 1 tablet by mouth daily as needed (constipation.). , Disp: , Rfl:    tacrolimus (PROGRAF) 1 MG capsule, Take 5 mg by mouth See admin instructions. 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, hold ASA 2-3 weeks - consider plavix instead of ASA   Asthma    Cellulitis 12/19/2019   right upper arm   Coronary artery disease    cath 11/2007, grafts patent /  Nuclear, June, 2011, prior inferior MI with mild peri-infarct ischemia, anterior breast attenuation, EF 67%, done for renal transplant assessment / Low level exercise/Lexiscan Myoview (11/2013): EF 67%, no ischemi; normal study   Diabetes mellitus    type 2   ESRD on dialysis [facility])    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, 2012   Family history of breast cancer    Family history of prostate cancer    GERD (gastroesophageal reflux disease)    GI bleed 11/26/2010    January, 2012 , AVM   Gout    History of methicillin resistant staphylococcus aureus (MRSA)    Hyperlipidemia    Hyperparathyroidism    Hypertension    Myocardial infarction [facility])    Osteoporosis    Pneumonia 08/2010    Hospitalization, October, 2011   Primary osteoarthritis, 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   S/P kidney transplant    September, 2012, [facility]   Subacromial impingement of 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    Tobacco Use: Social History   Tobacco Use  Smoking Status Former   Packs/day: 1.00   Years: 8.00   Total pack years: 8.00   Types: Cigarettes   Quit date: 11/21/1983   Years since quitting: 38.7  Smokeless Tobacco Never    Labs: Review Flowsheet  More data exists      Latest Ref Rng & Units 10/17/2021 11/03/2021 12/27/2021 02/22/2022 07/10/2022  Labs for ITP Cardiac and Pulmonary Rehab  Cholestrol 100 - 199 mg/dL 138  - - - -  LDL (calc) 0 - 99 mg/dL 57  - - - -  HDL-C >39 mg/dL 65  - - - -  Trlycerides 0 - 149 mg/dL 82  - - - -  Hemoglobin A1c 4.0 - 5.6 % - 6.5  - 6.1  6.4   TCO2 22 - 32 mmol/L - - 24  - -    Capillary Blood Glucose: Lab Results  Component Value Date   GLUCAP 109 (H) 08/11/2022   GLUCAP 91 12/27/2021   GLUCAP 153 (H) 02/21/2018   GLUCAP 170 (H) 02/21/2018   GLUCAP 123 (H) 02/21/2018     Pulmonary Assessment Scores:  Pulmonary Assessment Scores     Row Name 08/11/22 1437 08/11/22 1613       ADL UCSD   SOB Score total -- 90      CAT Score   CAT Score -- 25      mMRC Score   mMRC Score 4 4            UCSD: Self-administered rating of dyspnea associated with activities of daily living (ADLs) 6-point scale (0 = "not at all" to 5 = "maximal or unable to do because of breathlessness")  Scoring Scores range from 0 to 120.  Minimally important difference is 5 units  CAT: CAT can identify the health impairment of COPD patients and is better correlated with disease progression.  CAT has a scoring range of zero to 40. The CAT score is  classified into four groups of low (less than 10), medium (10 - 20), high (21-30) and very high (31-40) based on the impact level of disease on health status. A CAT score over 10 suggests significant symptoms.  A worsening CAT score could be explained by  an exacerbation, poor medication adherence, poor inhaler technique, or progression of COPD or comorbid conditions.  CAT MCID is 2 points  mMRC: mMRC (Modified Medical Research Council) Dyspnea Scale is used to assess the degree of baseline functional disability in patients of respiratory disease due to dyspnea. No minimal important difference is established. A decrease in score of 1 point or greater is considered a positive change.   Pulmonary Function Assessment:   Exercise Target Goals: Exercise Program Goal: Individual exercise prescription set using results from initial 6 min walk test and THRR while considering  patient's activity barriers and safety.   Exercise Prescription Goal: Initial exercise prescription builds to 30-45 minutes a day of aerobic activity, 2-3 days per week.  Home exercise guidelines will be given to patient during program as part of exercise prescription that the participant will acknowledge.  Activity Barriers & Risk Stratification:  Activity Barriers & Cardiac Risk Stratification - 08/11/22 1434       Activity Barriers & Cardiac Risk Stratification   Activity Barriers Left Knee Replacement;Shortness of Breath;Left Hip Replacement;Muscular Weakness;Deconditioning;Balance Concerns    Cardiac Risk Stratification High             6 Minute Walk:  6 Minute Walk     Row Name 08/11/22 1619         6 Minute Walk   Phase Initial     Distance 562 feet     Walk Time 6 minutes     # of Rest Breaks 1     MPH 1.06     METS 2.2     RPE 12     Perceived Dyspnea  2     VO2 Peak 7.69     Symptoms Yes (comment)     Comments SOB and fatigue, standing rest 2:00 min to 2:23     Resting HR 85 bpm     Resting BP  140/60     Resting Oxygen Saturation  92 %     Exercise Oxygen Saturation  during 6 min walk 90 %     Max Ex. HR 88 bpm     Max Ex. BP 144/62     2 Minute Post BP 138/66       Interval HR   1 Minute HR 88     2 Minute HR 87     3 Minute HR 85     4 Minute HR 86     5 Minute HR 85     6 Minute HR 88     2 Minute Post HR 68     Interval Heart Rate? Yes       Interval Oxygen   Interval Oxygen? Yes     Baseline Oxygen Saturation % 92 %     1 Minute Oxygen Saturation % 96 %     1 Minute Liters of Oxygen 2 L     2 Minute Oxygen Saturation % 94 %     2 Minute Liters of Oxygen 0 L     3 Minute Oxygen Saturation % 92 %     3 Minute Liters of Oxygen 2 L     4 Minute Oxygen Saturation % 94 %     4 Minute Liters of Oxygen 2 L     5 Minute Oxygen Saturation % 90 %     5 Minute Liters of Oxygen 2 L     6 Minute Oxygen Saturation % 92 %     6 Minute  Liters of Oxygen 2 L     2 Minute Post Oxygen Saturation % 91 %     2 Minute Post Liters of Oxygen 2 L              Oxygen Initial Assessment:  Oxygen Initial Assessment - 08/11/22 1431       Home Oxygen   Home Oxygen Device Home Concentrator    Sleep Oxygen Prescription Continuous    Liters per minute 2    Home Exercise Oxygen Prescription Continuous    Liters per minute 2    Home Resting Oxygen Prescription Continuous    Liters per minute 2    Compliance with Home Oxygen Use Yes      Initial 6 min Walk   Oxygen Used Continuous    Liters per minute 2      Program Oxygen Prescription   Program Oxygen Prescription Continuous    Liters per minute 2      Intervention   Short Term Goals To learn and exhibit compliance with exercise, home and travel O2 prescription;To learn and understand importance of monitoring SPO2 with pulse oximeter and demonstrate accurate use of the pulse oximeter.;To learn and understand importance of maintaining oxygen saturations>88%;To learn and demonstrate proper pursed lip breathing techniques or  other breathing techniques. ;To learn and demonstrate proper use of respiratory medications    Long  Term Goals Demonstrates proper use of MDI's;Compliance with respiratory medication;Exhibits proper breathing techniques, such as pursed lip breathing or other method taught during program session;Maintenance of O2 saturations>88%;Verbalizes importance of monitoring SPO2 with pulse oximeter and return demonstration;Exhibits compliance with exercise, home  and travel O2 prescription             Oxygen Re-Evaluation:   Oxygen Discharge (Final Oxygen Re-Evaluation):   Initial Exercise Prescription:  Initial Exercise Prescription - 08/11/22 1600       Date of Initial Exercise RX and Referring Provider   Date 08/11/22    Referring Provider [name]    Expected Discharge Date 10/19/22      Oxygen   Oxygen Continuous    Liters 2    Maintain Oxygen Saturation 88% or higher      NuStep   Level 1    SPM 60    Minutes 15      Track   Minutes 15    METs 2      Prescription Details   Frequency (times per week) 2    Duration Progress to 30 minutes of continuous aerobic without signs/symptoms of physical distress      Intensity   THRR 40-80% of Max Heartrate 56-113    Ratings of Perceived Exertion 11-13    Perceived Dyspnea 0-4      Progression   Progression Continue progressive overload as per policy without signs/symptoms or physical distress.      Resistance Training   Training Prescription Yes    Weight red bands    Reps 10-15             Perform Capillary Blood Glucose checks as needed.  Exercise Prescription Changes:   Exercise Comments:   Exercise Goals and Review:   Exercise Goals     Row Name 08/11/22 1435             Exercise Goals   Increase Physical Activity Yes       Intervention Provide advice, education, support and counseling about physical activity/exercise needs.;Develop an individualized exercise prescription for aerobic and resistive  training based on initial evaluation findings, risk stratification, comorbidities and participant's personal goals.       Expected Outcomes Short Term: Attend rehab on a regular basis to increase amount of physical activity.;Long Term: Add in home exercise to make exercise part of routine and to increase amount of physical activity.;Long Term: Exercising regularly at least 3-5 days a week.       Increase Strength and Stamina Yes       Intervention Provide advice, education, support and counseling about physical activity/exercise needs.;Develop an individualized exercise prescription for aerobic and resistive training based on initial evaluation findings, risk stratification, comorbidities and participant's personal goals.       Expected Outcomes Short Term: Increase workloads from initial exercise prescription for resistance, speed, and METs.;Short Term: Perform resistance training exercises routinely during rehab and add in resistance training at home;Long Term: Improve cardiorespiratory fitness, muscular endurance and strength as measured by increased METs and functional capacity (6MWT)       Able to understand and use rate of perceived exertion (RPE) scale Yes       Intervention Provide education and explanation on how to use RPE scale       Expected Outcomes Short Term: Able to use RPE daily in rehab to express subjective intensity level;Long Term:  Able to use RPE to guide intensity level when exercising independently       Able to understand and use Dyspnea scale Yes       Intervention Provide education and explanation on how to use Dyspnea scale       Expected Outcomes Short Term: Able to use Dyspnea scale daily in rehab to express subjective sense of shortness of breath during exertion;Long Term: Able to use Dyspnea scale to guide intensity level when exercising independently       Knowledge and understanding of Target Heart Rate Range (THRR) Yes       Intervention Provide education and  explanation of THRR including how the numbers were predicted and where they are located for reference       Expected Outcomes Short Term: Able to state/look up THRR;Long Term: Able to use THRR to govern intensity when exercising independently;Short Term: Able to use daily as guideline for intensity in rehab       Understanding of Exercise Prescription Yes       Intervention Provide education, explanation, and written materials on patient's individual exercise prescription       Expected Outcomes Short Term: Able to explain program exercise prescription;Long Term: Able to explain home exercise prescription to exercise independently                Exercise Goals Re-Evaluation :   Discharge Exercise Prescription (Final Exercise Prescription Changes):   Nutrition:  Target Goals: Understanding of nutrition guidelines, daily intake of sodium <1572m, cholesterol <2047m calories 30% from fat and 7% or less from saturated fats, daily to have 5 or more servings of fruits and vegetables.  Biometrics:  Pre Biometrics - 08/11/22 1615       Pre Biometrics   Grip Strength 24 kg              Nutrition Therapy Plan and Nutrition Goals:   Nutrition Assessments:  MEDIFICTS Score Key: ?70 Need to make dietary changes  40-70 Heart Healthy Diet ? 40 Therapeutic Level Cholesterol Diet   Picture Your Plate Scores: <4<78nhealthy dietary pattern with much room for improvement. 41-50 Dietary pattern unlikely to meet recommendations for good health and  room for improvement. 51-60 More healthful dietary pattern, with some room for improvement.  >60 Healthy dietary pattern, although there may be some specific behaviors that could be improved.    Nutrition Goals Re-Evaluation:   Nutrition Goals Discharge (Final Nutrition Goals Re-Evaluation):   Psychosocial: Target Goals: Acknowledge presence or absence of significant depression and/or stress, maximize coping skills, provide positive  support system. Participant is able to verbalize types and ability to use techniques and skills needed for reducing stress and depression.  Initial Review & Psychosocial Screening:  Initial Psych Review & Screening - 08/11/22 1428       Initial Review   Current issues with None Identified      Family Dynamics   Good Support System? No    Comments has four children who live in [place] that do not help or concerned about her health.  Yolanda Bonine helps with errands.       Barriers   Psychosocial barriers to participate in program The patient should benefit from training in stress management and relaxation.      Screening Interventions   Interventions Encouraged to exercise    Expected Outcomes Short Term goal: Identification and review with participant of any Quality of Life or Depression concerns found by scoring the questionnaire.;Long Term goal: The participant improves quality of Life and PHQ9 Scores as seen by post scores and/or verbalization of changes             Quality of Life Scores:  Scores of 19 and below usually indicate a poorer quality of life in these areas.  A difference of  2-3 points is a clinically meaningful difference.  A difference of 2-3 points in the total score of the Quality of Life Index has been associated with significant improvement in overall quality of life, self-image, physical symptoms, and general health in studies assessing change in quality of life.  PHQ-9: Review Flowsheet       08/11/2022 09/26/2018 07/01/2018  Depression screen PHQ 2/9  Decreased Interest 0 0 0  Down, Depressed, Hopeless 2 1 0  PHQ - 2 Score 2 1 0  Altered sleeping 0 0 0  Tired, decreased energy '2 1 2  ' Change in appetite 1 0 0  Feeling bad or failure about yourself  0 1 0  Trouble concentrating 1 0 0  Moving slowly or fidgety/restless 1 0 0  Suicidal thoughts 0 - -  PHQ-9 Score '7 3 2  ' Difficult doing work/chores Not difficult at all Not difficult at all Not difficult  at all   Interpretation of Total Score  Total Score Depression Severity:  1-4 = Minimal depression, 5-9 = Mild depression, 10-14 = Moderate depression, 15-19 = Moderately severe depression, 20-27 = Severe depression   Psychosocial Evaluation and Intervention:   Psychosocial Re-Evaluation:   Psychosocial Discharge (Final Psychosocial Re-Evaluation):   Education: Education Goals: Education classes will be provided on a weekly basis, covering required topics. Participant will state understanding/return demonstration of topics presented.  Learning Barriers/Preferences:  Learning Barriers/Preferences - 08/11/22 1429       Learning Barriers/Preferences   Learning Barriers Sight    Learning Preferences Written Material;Verbal Instruction;Group Instruction;Skilled Demonstration             Education Topics: Introduction to Pulmonary Rehab Group instruction provided by PowerPoint, verbal discussion, and written material to support subject matter. Instructor reviews what Pulmonary Rehab is, the purpose of the program, and how patients are referred.     Know Your Numbers Group instruction  that is supported by a PowerPoint presentation. Instructor discusses importance of knowing and understanding resting, exercise, and post-exercise oxygen saturation, heart rate, and blood pressure. Oxygen saturation, heart rate, blood pressure, rating of perceived exertion, and dyspnea are reviewed along with a normal range for these values.    Exercise for the Pulmonary Patient Group instruction that is supported by a PowerPoint presentation. Instructor discusses benefits of exercise, core components of exercise, frequency, duration, and intensity of an exercise routine, importance of utilizing pulse oximetry during exercise, safety while exercising, and options of places to exercise outside of rehab.  Flowsheet Row PULMONARY REHAB OTHER RESPIRATORY from 09/26/2018 in [place]  Date 09/05/18  Educator EP  Instruction Review Code 1- Verbalizes Understanding          MET Level  Group instruction provided by PowerPoint, verbal discussion, and written material to support subject matter. Instructor reviews what METs are and how to increase METs.    Pulmonary Medications Verbally interactive group education provided by instructor with focus on inhaled medications and proper administration. Flowsheet Row PULMONARY REHAB OTHER RESPIRATORY from 09/26/2018 in [place]  Date 07/30/18  Instruction Review Code 1- Verbalizes Understanding       Anatomy and Physiology of the Respiratory System Group instruction provided by PowerPoint, verbal discussion, and written material to support subject matter. Instructor reviews respiratory cycle and anatomical components of the respiratory system and their functions. Instructor also reviews differences in obstructive and restrictive respiratory diseases with examples of each.  Flowsheet Row PULMONARY REHAB OTHER RESPIRATORY from 09/26/2018 in [place]  Date 09/26/18  Educator rn  Instruction Review Code 2- Demonstrated Understanding       Oxygen Safety Group instruction provided by PowerPoint, verbal discussion, and written material to support subject matter. There is an overview of "What is Oxygen" and "Why do we need it".  Instructor also reviews how to create a safe environment for oxygen use, the importance of using oxygen as prescribed, and the risks of noncompliance. There is a brief discussion on traveling with oxygen and resources the patient may utilize. Flowsheet Row PULMONARY REHAB OTHER RESPIRATORY from 09/26/2018 in [place]  Date 08/22/18  Educator molly  Instruction Review Code 1- Verbalizes Understanding       Oxygen Use Group instruction provided by PowerPoint, verbal discussion, and  written material to discuss how supplemental oxygen is prescribed and different types of oxygen supply systems. Resources for more information are provided.    Breathing Techniques Group instruction that is supported by demonstration and informational handouts. Instructor discusses the benefits of pursed lip and diaphragmatic breathing and detailed demonstration on how to perform both.     Risk Factor Reduction Group instruction that is supported by a PowerPoint presentation. Instructor discusses the definition of a risk factor, different risk factors for pulmonary disease, and how the heart and lungs work together.   MD Day A group question and answer session with a medical doctor that allows participants to ask questions that relate to their pulmonary disease state. Flowsheet Row PULMONARY REHAB OTHER RESPIRATORY from 09/26/2018 in [place]  Date 09/12/18  Educator yacoub  Instruction Review Code 1- Verbalizes Understanding       Nutrition for the Pulmonary Patient Group instruction provided by PowerPoint slides, verbal discussion, and written materials to support subject matter. The instructor gives an explanation and review of healthy diet  recommendations, which includes a discussion on weight management, recommendations for fruit and vegetable consumption, as well as protein, fluid, caffeine, fiber, sodium, sugar, and alcohol. Tips for eating when patients are short of breath are discussed. Flowsheet Row PULMONARY REHAB OTHER RESPIRATORY from 09/26/2018 in [place]  Date 07/18/18  Educator Rodman Pickle  Instruction Review Code 2- Demonstrated Understanding        Other Education Group or individual verbal, written, or video instructions that support the educational goals of the pulmonary rehab program.    Knowledge Questionnaire Score:  Knowledge Questionnaire Score - 08/11/22 1615       Knowledge Questionnaire  Score   Pre Score 10/18             Core Components/Risk Factors/Patient Goals at Admission:  Personal Goals and Risk Factors at Admission - 08/11/22 1430       Core Components/Risk Factors/Patient Goals on Admission   Improve shortness of breath with ADL's Yes    Intervention Provide education, individualized exercise plan and daily activity instruction to help decrease symptoms of SOB with activities of daily living.    Expected Outcomes Short Term: Improve cardiorespiratory fitness to achieve a reduction of symptoms when performing ADLs;Long Term: Be able to perform more ADLs without symptoms or delay the onset of symptoms    Hypertension Yes    Intervention Provide education on lifestyle modifcations including regular physical activity/exercise, weight management, moderate sodium restriction and increased consumption of fresh fruit, vegetables, and low fat dairy, alcohol moderation, and smoking cessation.;Monitor prescription use compliance.    Expected Outcomes Short Term: Continued assessment and intervention until BP is < 140/7m HG in hypertensive participants. < 130/884mHG in hypertensive participants with diabetes, heart failure or chronic kidney disease.;Long Term: Maintenance of blood pressure at goal levels.             Core Components/Risk Factors/Patient Goals Review:    Core Components/Risk Factors/Patient Goals at Discharge (Final Review):    ITP Comments:   Comments: Dr. JaRodman Pickles Medical Director for Pulmonary Rehab at [facility]

## 2022-08-16 NOTE — Progress Notes (Signed)
Pulmonary Individual Treatment Plan  Patient Details  Name: [name] MRN: [id number] Date of Birth: [birth year] Referring Provider:   [name] Pulmonary Rehab Walk Test from 08/11/2022 in [place]  Referring Provider [name]       Initial Encounter Date:  Flowsheet Row Pulmonary Rehab Walk Test from 08/11/2022 in [place]  Date 08/11/22       Visit Diagnosis: Severe persistent asthma without complication  Patient's Home Medications on Admission:   Current Outpatient Medications:    acetaminophen (TYLENOL) 650 MG CR tablet, Take 650 mg by mouth every morning., Disp: , Rfl:    albuterol (PROVENTIL) (2.5 MG/3ML) 0.083% nebulizer solution, Take 2.5 mg by nebulization every 6 (six) hours as needed for wheezing or shortness of breath., Disp: , Rfl:    albuterol (VENTOLIN HFA) 108 (90 Base) MCG/ACT inhaler, Inhale 1-2 puffs into the lungs every 6 (six) hours as needed for wheezing or shortness of breath., Disp: , Rfl:    amLODipine (NORVASC) 10 MG tablet, Take 10 mg by mouth daily. , Disp: , Rfl:    aspirin EC 81 MG tablet, Take 81 mg by mouth daily., Disp: , Rfl:    Azelastine HCl 0.15 % SOLN, Place 1 spray into both nostrils daily as needed for allergies., Disp: , Rfl:    budesonide (PULMICORT) 0.25 MG/2ML nebulizer solution, Inhale 0.25 mg into the lungs as needed., Disp: , Rfl:    calcitRIOL (ROCALTROL) 0.25 MCG capsule, Take 0.25 mcg by mouth daily. , Disp: , Rfl:    ezetimibe (ZETIA) 10 MG tablet, Take 1 tablet (10 mg total) by mouth daily., Disp: 90 tablet, Rfl: 3   fluticasone (FLONASE) 50 MCG/ACT nasal spray, Place into both nostrils., Disp: , Rfl:    fluticasone furoate-vilanterol (BREO ELLIPTA) 200-25 MCG/INH AEPB, Inhale 1 puff into the lungs daily as needed (respiratory issues.). , Disp: , Rfl:    glucose blood (ONETOUCH VERIO) test strip, 1 each by Other route daily in the afternoon. Use as  instructed, Disp: 100 each, Rfl: 3   isosorbide mononitrate (IMDUR) 30 MG 24 hr tablet, TAKE 1 TABLET BY MOUTH EVERY DAY, Disp: 90 tablet, Rfl: 2   KLOR-CON M20 20 MEQ tablet, TAKE 1 TABLET BY MOUTH EVERY DAY, Disp: 90 tablet, Rfl: 2   metoprolol tartrate (LOPRESSOR) 50 MG tablet, TAKE 1 TABLET BY MOUTH TWICE A DAY, Disp: 180 tablet, Rfl: 0   nitroGLYCERIN (NITROSTAT) 0.4 MG SL tablet, Place 1 tablet (0.4 mg total) under the tongue every 5 (five) minutes as needed for chest pain., Disp: 25 tablet, Rfl: 1   omeprazole (PRILOSEC) 20 MG capsule, Take 20 mg by mouth daily after breakfast., Disp: , Rfl:    predniSONE (DELTASONE) 5 MG tablet, Take 5 mg by mouth 2 (two) times daily., Disp: , Rfl:    rosuvastatin (CRESTOR) 40 MG tablet, TAKE 1 TABLET BY MOUTH EVERY DAY, Disp: 90 tablet, Rfl: 0   sennosides-docusate sodium (SENOKOT-S) 8.6-50 MG tablet, Take 1 tablet by mouth daily as needed (constipation.). , Disp: , Rfl:    tacrolimus (PROGRAF) 1 MG capsule, Take 5 mg by mouth See admin instructions. 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, hold ASA 2-3 weeks - consider plavix instead of ASA   Asthma    Cellulitis 12/19/2019   right upper arm   Coronary artery disease    cath 11/2007, grafts patent /  Nuclear, June, 2011, prior inferior MI with mild peri-infarct ischemia, anterior breast attenuation, EF 67%, done for renal transplant assessment / Low level exercise/Lexiscan Myoview (11/2013): EF 67%, no ischemi; normal study   Diabetes mellitus    type 2   ESRD on dialysis [facility])    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, 2012   Family history of breast cancer    Family history of prostate cancer    GERD (gastroesophageal reflux disease)    GI bleed 11/26/2010    January, 2012 , AVM   Gout    History of methicillin resistant staphylococcus aureus (MRSA)    Hyperlipidemia    Hyperparathyroidism    Hypertension    Myocardial infarction [facility])    Osteoporosis    Pneumonia 08/2010    Hospitalization, October, 2011   Primary osteoarthritis, 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   S/P kidney transplant    September, 2012, [facility]   Subacromial impingement of 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    Tobacco Use: Social History   Tobacco Use  Smoking Status Former   Packs/day: 1.00   Years: 8.00   Total pack years: 8.00   Types: Cigarettes   Quit date: 11/21/1983   Years since quitting: 38.7  Smokeless Tobacco Never    Labs: Review Flowsheet  More data exists      Latest Ref Rng & Units 10/17/2021 11/03/2021 12/27/2021 02/22/2022 07/10/2022  Labs for ITP Cardiac and Pulmonary Rehab  Cholestrol 100 - 199 mg/dL 138  - - - -  LDL (calc) 0 - 99 mg/dL 57  - - - -  HDL-C >39 mg/dL 65  - - - -  Trlycerides 0 - 149 mg/dL 82  - - - -  Hemoglobin A1c 4.0 - 5.6 % - 6.5  - 6.1  6.4   TCO2 22 - 32 mmol/L - - 24  - -    Capillary Blood Glucose: Lab Results  Component Value Date   GLUCAP 109 (H) 08/11/2022   GLUCAP 91 12/27/2021   GLUCAP 153 (H) 02/21/2018   GLUCAP 170 (H) 02/21/2018   GLUCAP 123 (H) 02/21/2018     Pulmonary Assessment Scores:  Pulmonary Assessment Scores     Row Name 08/11/22 1437 08/11/22 1613       ADL UCSD   SOB Score total -- 90      CAT Score   CAT Score -- 25      mMRC Score   mMRC Score 4 4            UCSD: Self-administered rating of dyspnea associated with activities of daily living (ADLs) 6-point scale (0 = "not at all" to 5 = "maximal or unable to do because of breathlessness")  Scoring Scores range from 0 to 120.  Minimally important difference is 5 units  CAT: CAT can identify the health impairment of COPD patients and is better correlated with disease progression.  CAT has a scoring range of zero to 40. The CAT score is  classified into four groups of low (less than 10), medium (10 - 20), high (21-30) and very high (31-40) based on the impact level of disease on health status. A CAT score over 10 suggests significant symptoms.  A worsening CAT score could be explained by  an exacerbation, poor medication adherence, poor inhaler technique, or progression of COPD or comorbid conditions.  CAT MCID is 2 points  mMRC: mMRC (Modified Medical Research Council) Dyspnea Scale is used to assess the degree of baseline functional disability in patients of respiratory disease due to dyspnea. No minimal important difference is established. A decrease in score of 1 point or greater is considered a positive change.   Pulmonary Function Assessment:   Exercise Target Goals: Exercise Program Goal: Individual exercise prescription set using results from initial 6 min walk test and THRR while considering  patient's activity barriers and safety.   Exercise Prescription Goal: Initial exercise prescription builds to 30-45 minutes a day of aerobic activity, 2-3 days per week.  Home exercise guidelines will be given to patient during program as part of exercise prescription that the participant will acknowledge.  Activity Barriers & Risk Stratification:  Activity Barriers & Cardiac Risk Stratification - 08/11/22 1434       Activity Barriers & Cardiac Risk Stratification   Activity Barriers Left Knee Replacement;Shortness of Breath;Left Hip Replacement;Muscular Weakness;Deconditioning;Balance Concerns    Cardiac Risk Stratification High             6 Minute Walk:  6 Minute Walk     Row Name 08/11/22 1619         6 Minute Walk   Phase Initial     Distance 562 feet     Walk Time 6 minutes     # of Rest Breaks 1     MPH 1.06     METS 2.2     RPE 12     Perceived Dyspnea  2     VO2 Peak 7.69     Symptoms Yes (comment)     Comments SOB and fatigue, standing rest 2:00 min to 2:23     Resting HR 85 bpm     Resting BP  140/60     Resting Oxygen Saturation  92 %     Exercise Oxygen Saturation  during 6 min walk 90 %     Max Ex. HR 88 bpm     Max Ex. BP 144/62     2 Minute Post BP 138/66       Interval HR   1 Minute HR 88     2 Minute HR 87     3 Minute HR 85     4 Minute HR 86     5 Minute HR 85     6 Minute HR 88     2 Minute Post HR 68     Interval Heart Rate? Yes       Interval Oxygen   Interval Oxygen? Yes     Baseline Oxygen Saturation % 92 %     1 Minute Oxygen Saturation % 96 %     1 Minute Liters of Oxygen 2 L     2 Minute Oxygen Saturation % 94 %     2 Minute Liters of Oxygen 0 L     3 Minute Oxygen Saturation % 92 %     3 Minute Liters of Oxygen 2 L     4 Minute Oxygen Saturation % 94 %     4 Minute Liters of Oxygen 2 L     5 Minute Oxygen Saturation % 90 %     5 Minute Liters of Oxygen 2 L     6 Minute Oxygen Saturation % 92 %     6 Minute  Liters of Oxygen 2 L     2 Minute Post Oxygen Saturation % 91 %     2 Minute Post Liters of Oxygen 2 L              Oxygen Initial Assessment:  Oxygen Initial Assessment - 08/11/22 1431       Home Oxygen   Home Oxygen Device Home Concentrator    Sleep Oxygen Prescription Continuous    Liters per minute 2    Home Exercise Oxygen Prescription Continuous    Liters per minute 2    Home Resting Oxygen Prescription Continuous    Liters per minute 2    Compliance with Home Oxygen Use Yes      Initial 6 min Walk   Oxygen Used Continuous    Liters per minute 2      Program Oxygen Prescription   Program Oxygen Prescription Continuous    Liters per minute 2      Intervention   Short Term Goals To learn and exhibit compliance with exercise, home and travel O2 prescription;To learn and understand importance of monitoring SPO2 with pulse oximeter and demonstrate accurate use of the pulse oximeter.;To learn and understand importance of maintaining oxygen saturations>88%;To learn and demonstrate proper pursed lip breathing techniques or  other breathing techniques. ;To learn and demonstrate proper use of respiratory medications    Long  Term Goals Demonstrates proper use of MDI's;Compliance with respiratory medication;Exhibits proper breathing techniques, such as pursed lip breathing or other method taught during program session;Maintenance of O2 saturations>88%;Verbalizes importance of monitoring SPO2 with pulse oximeter and return demonstration;Exhibits compliance with exercise, home  and travel O2 prescription             Oxygen Re-Evaluation:  Oxygen Re-Evaluation     Row Name 08/14/22 0749             Program Oxygen Prescription   Program Oxygen Prescription Continuous       Liters per minute 2         Home Oxygen   Home Oxygen Device Home Concentrator       Sleep Oxygen Prescription Continuous       Liters per minute 2       Home Exercise Oxygen Prescription Continuous       Liters per minute 2       Home Resting Oxygen Prescription Continuous       Liters per minute 2       Compliance with Home Oxygen Use Yes         Goals/Expected Outcomes   Short Term Goals To learn and exhibit compliance with exercise, home and travel O2 prescription;To learn and understand importance of monitoring SPO2 with pulse oximeter and demonstrate accurate use of the pulse oximeter.;To learn and understand importance of maintaining oxygen saturations>88%;To learn and demonstrate proper pursed lip breathing techniques or other breathing techniques. ;To learn and demonstrate proper use of respiratory medications       Long  Term Goals Demonstrates proper use of MDI's;Compliance with respiratory medication;Exhibits proper breathing techniques, such as pursed lip breathing or other method taught during program session;Maintenance of O2 saturations>88%;Verbalizes importance of monitoring SPO2 with pulse oximeter and return demonstration;Exhibits compliance with exercise, home  and travel O2 prescription       Goals/Expected Outcomes  Compliance and understanding of oxygen saturation monitoring and breathing techniques to decrease shortness of breath.  Oxygen Discharge (Final Oxygen Re-Evaluation):  Oxygen Re-Evaluation - 08/14/22 0749       Program Oxygen Prescription   Program Oxygen Prescription Continuous    Liters per minute 2      Home Oxygen   Home Oxygen Device Home Concentrator    Sleep Oxygen Prescription Continuous    Liters per minute 2    Home Exercise Oxygen Prescription Continuous    Liters per minute 2    Home Resting Oxygen Prescription Continuous    Liters per minute 2    Compliance with Home Oxygen Use Yes      Goals/Expected Outcomes   Short Term Goals To learn and exhibit compliance with exercise, home and travel O2 prescription;To learn and understand importance of monitoring SPO2 with pulse oximeter and demonstrate accurate use of the pulse oximeter.;To learn and understand importance of maintaining oxygen saturations>88%;To learn and demonstrate proper pursed lip breathing techniques or other breathing techniques. ;To learn and demonstrate proper use of respiratory medications    Long  Term Goals Demonstrates proper use of MDI's;Compliance with respiratory medication;Exhibits proper breathing techniques, such as pursed lip breathing or other method taught during program session;Maintenance of O2 saturations>88%;Verbalizes importance of monitoring SPO2 with pulse oximeter and return demonstration;Exhibits compliance with exercise, home  and travel O2 prescription    Goals/Expected Outcomes Compliance and understanding of oxygen saturation monitoring and breathing techniques to decrease shortness of breath.             Initial Exercise Prescription:  Initial Exercise Prescription - 08/11/22 1600       Date of Initial Exercise RX and Referring Provider   Date 08/11/22    Referring Provider [name]    Expected Discharge Date 10/19/22      Oxygen   Oxygen Continuous     Liters 2    Maintain Oxygen Saturation 88% or higher      NuStep   Level 1    SPM 60    Minutes 15      Track   Minutes 15    METs 2      Prescription Details   Frequency (times per week) 2    Duration Progress to 30 minutes of continuous aerobic without signs/symptoms of physical distress      Intensity   THRR 40-80% of Max Heartrate 56-113    Ratings of Perceived Exertion 11-13    Perceived Dyspnea 0-4      Progression   Progression Continue progressive overload as per policy without signs/symptoms or physical distress.      Resistance Training   Training Prescription Yes    Weight red bands    Reps 10-15             Perform Capillary Blood Glucose checks as needed.  Exercise Prescription Changes:   Exercise Comments:   Exercise Goals and Review:   Exercise Goals     Row Name 08/11/22 1435 08/14/22 0748           Exercise Goals   Increase Physical Activity Yes Yes      Intervention Provide advice, education, support and counseling about physical activity/exercise needs.;Develop an individualized exercise prescription for aerobic and resistive training based on initial evaluation findings, risk stratification, comorbidities and participant's personal goals. Provide advice, education, support and counseling about physical activity/exercise needs.;Develop an individualized exercise prescription for aerobic and resistive training based on initial evaluation findings, risk stratification, comorbidities and participant's personal goals.      Expected Outcomes Short Term:  Attend rehab on a regular basis to increase amount of physical activity.;Long Term: Add in home exercise to make exercise part of routine and to increase amount of physical activity.;Long Term: Exercising regularly at least 3-5 days a week. Short Term: Attend rehab on a regular basis to increase amount of physical activity.;Long Term: Add in home exercise to make exercise part of routine and to  increase amount of physical activity.;Long Term: Exercising regularly at least 3-5 days a week.      Increase Strength and Stamina Yes Yes      Intervention Provide advice, education, support and counseling about physical activity/exercise needs.;Develop an individualized exercise prescription for aerobic and resistive training based on initial evaluation findings, risk stratification, comorbidities and participant's personal goals. Provide advice, education, support and counseling about physical activity/exercise needs.;Develop an individualized exercise prescription for aerobic and resistive training based on initial evaluation findings, risk stratification, comorbidities and participant's personal goals.      Expected Outcomes Short Term: Increase workloads from initial exercise prescription for resistance, speed, and METs.;Short Term: Perform resistance training exercises routinely during rehab and add in resistance training at home;Long Term: Improve cardiorespiratory fitness, muscular endurance and strength as measured by increased METs and functional capacity (6MWT) Short Term: Increase workloads from initial exercise prescription for resistance, speed, and METs.;Short Term: Perform resistance training exercises routinely during rehab and add in resistance training at home;Long Term: Improve cardiorespiratory fitness, muscular endurance and strength as measured by increased METs and functional capacity (6MWT)      Able to understand and use rate of perceived exertion (RPE) scale Yes Yes      Intervention Provide education and explanation on how to use RPE scale Provide education and explanation on how to use RPE scale      Expected Outcomes Short Term: Able to use RPE daily in rehab to express subjective intensity level;Long Term:  Able to use RPE to guide intensity level when exercising independently Short Term: Able to use RPE daily in rehab to express subjective intensity level;Long Term:  Able to  use RPE to guide intensity level when exercising independently      Able to understand and use Dyspnea scale Yes Yes      Intervention Provide education and explanation on how to use Dyspnea scale Provide education and explanation on how to use Dyspnea scale      Expected Outcomes Short Term: Able to use Dyspnea scale daily in rehab to express subjective sense of shortness of breath during exertion;Long Term: Able to use Dyspnea scale to guide intensity level when exercising independently Short Term: Able to use Dyspnea scale daily in rehab to express subjective sense of shortness of breath during exertion;Long Term: Able to use Dyspnea scale to guide intensity level when exercising independently      Knowledge and understanding of Target Heart Rate Range (THRR) Yes Yes      Intervention Provide education and explanation of THRR including how the numbers were predicted and where they are located for reference Provide education and explanation of THRR including how the numbers were predicted and where they are located for reference      Expected Outcomes Short Term: Able to state/look up THRR;Long Term: Able to use THRR to govern intensity when exercising independently;Short Term: Able to use daily as guideline for intensity in rehab Short Term: Able to state/look up THRR;Long Term: Able to use THRR to govern intensity when exercising independently;Short Term: Able to use daily as guideline for intensity in  rehab      Understanding of Exercise Prescription Yes Yes      Intervention Provide education, explanation, and written materials on patient's individual exercise prescription Provide education, explanation, and written materials on patient's individual exercise prescription      Expected Outcomes Short Term: Able to explain program exercise prescription;Long Term: Able to explain home exercise prescription to exercise independently Short Term: Able to explain program exercise prescription;Long Term: Able  to explain home exercise prescription to exercise independently               Exercise Goals Re-Evaluation :  Exercise Goals Re-Evaluation     Row Name 08/14/22 0752             Exercise Goal Re-Evaluation   Exercise Goals Review Increase Physical Activity;Increase Strength and Stamina;Able to understand and use rate of perceived exertion (RPE) scale;Able to understand and use Dyspnea scale;Knowledge and understanding of Target Heart Rate Range (THRR);Understanding of Exercise Prescription       Comments Pt is scheduled to begin exercise this week.       Expected Outcomes Through exercise at rehab and home, the patient will decrease shortness of breath with daily activities and feel confident in carrying out an exercise regimen.                Discharge Exercise Prescription (Final Exercise Prescription Changes):   Nutrition:  Target Goals: Understanding of nutrition guidelines, daily intake of sodium <1564m, cholesterol <2035m calories 30% from fat and 7% or less from saturated fats, daily to have 5 or more servings of fruits and vegetables.  Biometrics:  Pre Biometrics - 08/11/22 1615       Pre Biometrics   Grip Strength 24 kg              Nutrition Therapy Plan and Nutrition Goals:   Nutrition Assessments:  MEDIFICTS Score Key: ?70 Need to make dietary changes  40-70 Heart Healthy Diet ? 40 Therapeutic Level Cholesterol Diet   Picture Your Plate Scores: <4<41nhealthy dietary pattern with much room for improvement. 41-50 Dietary pattern unlikely to meet recommendations for good health and room for improvement. 51-60 More healthful dietary pattern, with some room for improvement.  >60 Healthy dietary pattern, although there may be some specific behaviors that could be improved.    Nutrition Goals Re-Evaluation:   Nutrition Goals Discharge (Final Nutrition Goals Re-Evaluation):   Psychosocial: Target Goals: Acknowledge presence or absence of  significant depression and/or stress, maximize coping skills, provide positive support system. Participant is able to verbalize types and ability to use techniques and skills needed for reducing stress and depression.  Initial Review & Psychosocial Screening:  Initial Psych Review & Screening - 08/11/22 1428       Initial Review   Current issues with None Identified      Family Dynamics   Good Support System? No    Comments has four [place] or concerned about her health.  [place] with errands.       Barriers   Psychosocial barriers to participate in program The patient should benefit from training in stress management and relaxation.      Screening Interventions   Interventions Encouraged to exercise    Expected Outcomes Short Term goal: Identification and review with participant of any Quality of Life or Depression concerns found by scoring the questionnaire.;Long Term goal: The participant improves quality of Life and PHQ9 Scores as seen by  post scores and/or verbalization of changes             Quality of Life Scores:  Scores of 19 and below usually indicate a poorer quality of life in these areas.  A difference of  2-3 points is a clinically meaningful difference.  A difference of 2-3 points in the total score of the Quality of Life Index has been associated with significant improvement in overall quality of life, self-image, physical symptoms, and general health in studies assessing change in quality of life.  PHQ-9: Review Flowsheet       08/11/2022 09/26/2018 07/01/2018  Depression screen PHQ 2/9  Decreased Interest 0 0 0  Down, Depressed, Hopeless 2 1 0  PHQ - 2 Score 2 1 0  Altered sleeping 0 0 0  Tired, decreased energy '2 1 2  ' Change in appetite 1 0 0  Feeling bad or failure about yourself  0 1 0  Trouble concentrating 1 0 0  Moving slowly or fidgety/restless 1 0 0  Suicidal thoughts 0 - -  PHQ-9 Score '7 3 2   ' Difficult doing work/chores Not difficult at all Not difficult at all Not difficult at all   Interpretation of Total Score  Total Score Depression Severity:  1-4 = Minimal depression, 5-9 = Mild depression, 10-14 = Moderate depression, 15-19 = Moderately severe depression, 20-27 = Severe depression   Psychosocial Evaluation and Intervention:  Psychosocial Evaluation - 08/16/22 0818       Psychosocial Evaluation & Interventions   Interventions Encouraged to exercise with the program and follow exercise prescription    Comments Pt is scheduled to exercise this week. Luva does not seem to have pyschosocial barriers.    Expected Outcomes For Elvena to attend rehab free of psychosocial concerns.    Continue Psychosocial Services  No Follow up required             Psychosocial Re-Evaluation:  Psychosocial Re-Evaluation     [place] Name 08/16/22 [phone]             Psychosocial Re-Evaluation   Current issues with None Identified       Comments no identifiable barriers       Expected Outcomes For pt to attend rehab free of psychosocial concerns.       Interventions Encouraged to attend Pulmonary Rehabilitation for the exercise       Continue Psychosocial Services  No Follow up required                Psychosocial Discharge (Final Psychosocial Re-Evaluation):  Psychosocial Re-Evaluation - 08/16/22 9458       Psychosocial Re-Evaluation   Current issues with None Identified    Comments no identifiable barriers    Expected Outcomes For pt to attend rehab free of psychosocial concerns.    Interventions Encouraged to attend Pulmonary Rehabilitation for the exercise    Continue Psychosocial Services  No Follow up required             Education: Education Goals: Education classes will be provided on a weekly basis, covering required topics. Participant will state understanding/return demonstration of topics presented.  Learning Barriers/Preferences:  Learning  Barriers/Preferences - 08/11/22 1429       Learning Barriers/Preferences   Learning Barriers Sight    Learning Preferences Written Material;Verbal Instruction;Group Instruction;Skilled Demonstration             Education Topics: Introduction to Pulmonary Rehab Group instruction provided by PowerPoint, verbal discussion, and written material to  support subject matter. Instructor reviews what Pulmonary Rehab is, the purpose of the program, and how patients are referred.     Know Your Numbers Group instruction that is supported by a PowerPoint presentation. Instructor discusses importance of knowing and understanding resting, exercise, and post-exercise oxygen saturation, heart rate, and blood pressure. Oxygen saturation, heart rate, blood pressure, rating of perceived exertion, and dyspnea are reviewed along with a normal range for these values.    Exercise for the Pulmonary Patient Group instruction that is supported by a PowerPoint presentation. Instructor discusses benefits of exercise, core components of exercise, frequency, duration, and intensity of an exercise routine, importance of utilizing pulse oximetry during exercise, safety while exercising, and options of places to exercise outside of rehab.  Flowsheet Row PULMONARY REHAB OTHER RESPIRATORY from 09/26/2018 in [place]  Date 09/05/18  Educator EP  Instruction Review Code 1- Verbalizes Understanding          MET Level  Group instruction provided by PowerPoint, verbal discussion, and written material to support subject matter. Instructor reviews what METs are and how to increase METs.    Pulmonary Medications Verbally interactive group education provided by instructor with focus on inhaled medications and proper administration. Flowsheet Row PULMONARY REHAB OTHER RESPIRATORY from 09/26/2018 in [place]  Date 07/30/18  Instruction Review Code 1-  Verbalizes Understanding       Anatomy and Physiology of the Respiratory System Group instruction provided by PowerPoint, verbal discussion, and written material to support subject matter. Instructor reviews respiratory cycle and anatomical components of the respiratory system and their functions. Instructor also reviews differences in obstructive and restrictive respiratory diseases with examples of each.  Flowsheet Row PULMONARY REHAB OTHER RESPIRATORY from 09/26/2018 in [place]  Date 09/26/18  Educator rn  Instruction Review Code 2- Demonstrated Understanding       Oxygen Safety Group instruction provided by PowerPoint, verbal discussion, and written material to support subject matter. There is an overview of "What is Oxygen" and "Why do we need it".  Instructor also reviews how to create a safe environment for oxygen use, the importance of using oxygen as prescribed, and the risks of noncompliance. There is a brief discussion on traveling with oxygen and resources the patient may utilize. Flowsheet Row PULMONARY REHAB OTHER RESPIRATORY from 09/26/2018 in [place]  Date 08/22/18  Educator molly  Instruction Review Code 1- Verbalizes Understanding       Oxygen Use Group instruction provided by PowerPoint, verbal discussion, and written material to discuss how supplemental oxygen is prescribed and different types of oxygen supply systems. Resources for more information are provided.    Breathing Techniques Group instruction that is supported by demonstration and informational handouts. Instructor discusses the benefits of pursed lip and diaphragmatic breathing and detailed demonstration on how to perform both.     Risk Factor Reduction Group instruction that is supported by a PowerPoint presentation. Instructor discusses the definition of a risk factor, different risk factors for pulmonary disease, and how the heart  and lungs work together.   MD Day A group question and answer session with a medical doctor that allows participants to ask questions that relate to their pulmonary disease state. Flowsheet Row PULMONARY REHAB OTHER RESPIRATORY from 09/26/2018 in [place]  Date 09/12/18  Educator yacoub  Instruction Review Code 1- Verbalizes Understanding       Nutrition for  the Pulmonary Patient Group instruction provided by PowerPoint slides, verbal discussion, and written materials to support subject matter. The instructor gives an explanation and review of healthy diet recommendations, which includes a discussion on weight management, recommendations for fruit and vegetable consumption, as well as protein, fluid, caffeine, fiber, sodium, sugar, and alcohol. Tips for eating when patients are short of breath are discussed. Flowsheet Row PULMONARY REHAB OTHER RESPIRATORY from 09/26/2018 in [place]  Date 07/18/18  Educator Rodman Pickle  Instruction Review Code 2- Demonstrated Understanding        Other Education Group or individual verbal, written, or video instructions that support the educational goals of the pulmonary rehab program.    Knowledge Questionnaire Score:  Knowledge Questionnaire Score - 08/11/22 1615       Knowledge Questionnaire Score   Pre Score 10/18             Core Components/Risk Factors/Patient Goals at Admission:  Personal Goals and Risk Factors at Admission - 08/11/22 1430       Core Components/Risk Factors/Patient Goals on Admission   Improve shortness of breath with ADL's Yes    Intervention Provide education, individualized exercise plan and daily activity instruction to help decrease symptoms of SOB with activities of daily living.    Expected Outcomes Short Term: Improve cardiorespiratory fitness to achieve a reduction of symptoms when performing ADLs;Long Term: Be able to perform more ADLs without  symptoms or delay the onset of symptoms    Hypertension Yes    Intervention Provide education on lifestyle modifcations including regular physical activity/exercise, weight management, moderate sodium restriction and increased consumption of fresh fruit, vegetables, and low fat dairy, alcohol moderation, and smoking cessation.;Monitor prescription use compliance.    Expected Outcomes Short Term: Continued assessment and intervention until BP is < 140/57m HG in hypertensive participants. < 130/850mHG in hypertensive participants with diabetes, heart failure or chronic kidney disease.;Long Term: Maintenance of blood pressure at goal levels.             Core Components/Risk Factors/Patient Goals Review:   Goals and Risk Factor Review     Row Name 08/16/22 0825             Core Components/Risk Factors/Patient Goals Review   Personal Goals Review Hypertension;Develop more efficient breathing techniques such as purse lipped breathing and diaphragmatic breathing and practicing self-pacing with activity.;Improve shortness of breath with ADL's       Review Pt is scheduled to begin exercise this week. Will continue to assess.       Expected Outcomes See admission goals.                Core Components/Risk Factors/Patient Goals at Discharge (Final Review):   Goals and Risk Factor Review - 08/16/22 0825       Core Components/Risk Factors/Patient Goals Review   Personal Goals Review Hypertension;Develop more efficient breathing techniques such as purse lipped breathing and diaphragmatic breathing and practicing self-pacing with activity.;Improve shortness of breath with ADL's    Review Pt is scheduled to begin exercise this week. Will continue to assess.    Expected Outcomes See admission goals.             ITP Comments: Pt is scheduled to begin exercise this week. Recommend continued exercise, life style modification, education, and utilization of breathing techniques to increase  stamina and strength, while also decreasing shortness of breath with exertion.  Dr. JaRodman Pickles Medical Director for Pulmonary Rehab  at [facility].

## 2022-08-17 ENCOUNTER — Encounter (HOSPITAL_COMMUNITY)
Admission: RE | Admit: 2022-08-17 | Discharge: 2022-08-17 | Disposition: A | Discharge status: Home / Self Care | Payer: Medicare Other | Source: Ambulatory Visit | Attending: Pulmonary Disease | Admitting: Pulmonary Disease

## 2022-08-17 DIAGNOSIS — J4550 Severe persistent asthma, uncomplicated: Secondary | ICD-10-CM

## 2022-08-17 LAB — GLUCOSE, CAPILLARY
Glucose-Capillary: 96 mg/dL (ref 70–99)
Glucose-Capillary: 115 mg/dL — ABNORMAL HIGH (ref 70–99)

## 2022-08-17 NOTE — Progress Notes (Signed)
Daily Session Note  Patient Details  Name: [name] MRN: [id number] Date of Birth: 1941/10/13 Referring Provider:   [name] Pulmonary Rehab Walk Test from 08/11/2022 in [place]  Referring Provider [name]       Encounter Date: 08/17/2022  Check In:  Session Check In - 08/17/22 1134       Check-In   Supervising physician immediately available to respond to emergencies [facility] - Physician supervision    Physician(s) Carlis Abbott    [facility]    Staff Present Elmon Else, MS, ACSM-CEP, Exercise Physiologist;Samantha [place], RD, LDN;Lisa Ysidro Evert, RN;Carlette Carlton, RN, BSN;Randi Reeve BS, ACSM-CEP, Exercise Physiologist    Virtual Visit No    Medication changes reported     No    Fall or balance concerns reported    No    Tobacco Cessation No Change    Warm-up and Cool-down Performed as group-led Higher education careers adviser Performed Yes    VAD Patient? No    PAD/SET Patient? No      Pain Assessment   Currently in Pain? No/denies             Capillary Blood Glucose: Results for orders placed or performed during the hospital encounter of 08/17/22 (from the past 24 hour(s))  Glucose, capillary     Status: Abnormal   Collection Time: 08/17/22 11:48 AM  Result Value Ref Range   Glucose-Capillary 115 (H) 70 - 99 mg/dL      Social History   Tobacco Use  Smoking Status Former   Packs/day: 1.00   Years: 8.00   Total pack years: 8.00   Types: Cigarettes   Quit date: 11/21/1983   Years since quitting: 38.7  Smokeless Tobacco Never    Goals Met:  Exercise tolerated well No report of concerns or symptoms today Strength training completed today  Goals Unmet:  Not Applicable  Comments: Service time is from 1017 to 1145.    Dr. Rodman Pickle is Medical Director for Pulmonary Rehab at [facility].

## 2022-08-22 ENCOUNTER — Encounter (HOSPITAL_COMMUNITY)
Admission: RE | Admit: 2022-08-22 | Discharge: 2022-08-22 | Disposition: A | Discharge status: Home / Self Care | Payer: Medicare Other | Source: Ambulatory Visit | Attending: Pulmonary Disease | Admitting: Pulmonary Disease

## 2022-08-22 VITALS — Wt 139.1 lb

## 2022-08-22 DIAGNOSIS — R0602 Shortness of breath: Secondary | ICD-10-CM | POA: Insufficient documentation

## 2022-08-22 DIAGNOSIS — J449 Chronic obstructive pulmonary disease, unspecified: Secondary | ICD-10-CM | POA: Insufficient documentation

## 2022-08-22 DIAGNOSIS — J4550 Severe persistent asthma, uncomplicated: Secondary | ICD-10-CM | POA: Diagnosis present

## 2022-08-22 DIAGNOSIS — Z87891 Personal history of nicotine dependence: Secondary | ICD-10-CM | POA: Diagnosis not present

## 2022-08-22 NOTE — Progress Notes (Signed)
Daily Session Note  Patient Details  Name: [name] MRN: [id number] Date of Birth: [birth year] Referring Provider:   [name] Pulmonary Rehab Walk Test from 08/11/2022 in [place]  Referring Provider [name]       Encounter Date: 08/22/2022  Check In:  Session Check In - 08/22/22 1121       Check-In   Supervising physician immediately available to respond to emergencies [facility] - Physician supervision    Physician(s) Dr. Shearon Stalls    [facility]    Staff Present Elmon Else, MS, ACSM-CEP, Exercise Physiologist;Tylor Gambrill Ysidro Evert, RN;Randi Yevonne Pax, ACSM-CEP, Exercise Physiologist;Carlette Wilber Oliphant, RN, BSN    Virtual Visit No    Medication changes reported     No    Fall or balance concerns reported    No    Tobacco Cessation No Change    Warm-up and Cool-down Performed as group-led instruction    Resistance Training Performed Yes    VAD Patient? No    PAD/SET Patient? No      Pain Assessment   Currently in Pain? No/denies    Multiple Pain Sites No             Capillary Blood Glucose: No results found for this or any previous visit (from the past 24 hour(s)).  POCT Glucose - 08/22/22 1220       POCT Blood Glucose   Pre-Exercise 104 mg/dL    Post-Exercise 130 mg/dL             Exercise Prescription Changes - 08/22/22 1200       Response to Exercise   Blood Pressure (Admit) 114/64    Blood Pressure (Exercise) 168/80    Blood Pressure (Exit) 136/58    Heart Rate (Admit) 83 bpm    Heart Rate (Exercise) 92 bpm    Heart Rate (Exit) 90 bpm    Oxygen Saturation (Admit) 93 %    Oxygen Saturation (Exercise) 88 %    Oxygen Saturation (Exit) 90 %    Rating of Perceived Exertion (Exercise) 13    Perceived Dyspnea (Exercise) 2    Duration Progress to 30 minutes of  aerobic without signs/symptoms of physical distress    Intensity Other (comment)   40-80% of HRR     Progression   Progression Continue to  progress workloads to maintain intensity without signs/symptoms of physical distress.      Resistance Training   Training Prescription Yes    Weight Red bands    Reps 10-15      Oxygen   Oxygen Continuous    Liters 2      NuStep   Level 1    SPM 60    Minutes 15    METs 1.7      Track   Laps 4    Minutes 15             Social History   Tobacco Use  Smoking Status Former   Packs/day: 1.00   Years: 8.00   Total pack years: 8.00   Types: Cigarettes   Quit date: 11/21/1983   Years since quitting: 38.7  Smokeless Tobacco Never    Goals Met: Strength training completed.   Goals Unmet: O2 Sat Walking on the track [name]'s oxygen saturation dropped to 86-88%  on 2 liters of oxygen. She required frequent rest stops due to SOB. Encouraged frequent rest stops and PLB.   Comments: Service time is from 1000 to  1200 Educated pt in use of her new one touch blood sugar meter.   Dr. Rodman Pickle is Medical Director for Pulmonary Rehab at [facility].

## 2022-08-22 NOTE — Progress Notes (Signed)
[name] came in today with a new one touch glucose machine that was given to her by her PCP.She was asking for her help and had no written directions with how to use the meter. Looked up on line and made patient a copy of the directions. Worked with her and let her complete her second test. Also had her to watch a video online on how to properly use the lancet. She was a to perform both correctly and voiced understanding. 1130-1200

## 2022-08-24 ENCOUNTER — Telehealth (HOSPITAL_COMMUNITY): Payer: Self-pay

## 2022-08-24 ENCOUNTER — Encounter (HOSPITAL_COMMUNITY)
Admission: RE | Admit: 2022-08-24 | Discharge: 2022-08-24 | Disposition: A | Discharge status: Home / Self Care | Payer: Medicare Other | Source: Ambulatory Visit | Attending: Pulmonary Disease | Admitting: Pulmonary Disease

## 2022-08-24 DIAGNOSIS — J4550 Severe persistent asthma, uncomplicated: Secondary | ICD-10-CM

## 2022-08-24 NOTE — Telephone Encounter (Signed)
Called [facility] and left message for Dr. Melford Aase. [facility] is interested in referral for home health aide. [name] reports difficulty in bathing, cleaning, cooking, etc.

## 2022-08-24 NOTE — Progress Notes (Signed)
Daily Session Note  Patient Details  Name: [name] MRN: [id number] Date of Birth: 1941/06/08 Referring Provider:   [name] Pulmonary Rehab Walk Test from 08/11/2022 in [place]  Referring Provider [name]       Encounter Date: 08/24/2022  Check In:  Session Check In - 08/24/22 1120       Check-In   Supervising physician immediately available to respond to emergencies [facility] - Physician supervision    Physician(s) Shearon Stalls    [facility]    Staff Present Elmon Else, MS, ACSM-CEP, Exercise Physiologist;Samantha [place], RD, LDN;Arnulfo Batson Ysidro Evert, RN;Randi [facility], ACSM-CEP, Exercise Physiologist;Jetta Gilford Rile BS, ACSM-CEP, Exercise Physiologist    Virtual Visit No    Medication changes reported     No    Fall or balance concerns reported    No    Warm-up and Cool-down Performed as group-led Higher education careers adviser Performed Yes    VAD Patient? No    PAD/SET Patient? No      Pain Assessment   Currently in Pain? No/denies             Capillary Blood Glucose: No results found for this or any previous visit (from the past 24 hour(s)).    Social History   Tobacco Use  Smoking Status Former   Packs/day: 1.00   Years: 8.00   Total pack years: 8.00   Types: Cigarettes   Quit date: 11/21/1983   Years since quitting: 38.7  Smokeless Tobacco Never    Goals Met:  Independence with exercise equipment Exercise tolerated well No report of concerns or symptoms today Strength training completed today  Goals Unmet:  O2 Sat On the track [place] dropped her saturation to 85% O2 increased to 3 liters saturation remained 87% oxygen increased to 4 liters. Saturation improved to 4 liters  Comments: Service time is from 1017 to 1145    Dr. Rodman Pickle is Medical Director for Pulmonary Rehab at [facility]

## 2022-08-29 ENCOUNTER — Encounter (HOSPITAL_COMMUNITY)
Admission: RE | Admit: 2022-08-29 | Discharge: 2022-08-29 | Disposition: A | Discharge status: Home / Self Care | Payer: Medicare Other | Source: Ambulatory Visit | Attending: Pulmonary Disease | Admitting: Pulmonary Disease

## 2022-08-29 DIAGNOSIS — J4550 Severe persistent asthma, uncomplicated: Secondary | ICD-10-CM

## 2022-08-29 NOTE — Progress Notes (Signed)
Daily Session Note  Patient Details  Name: TALANI BRAZEE MRN: [id number] Date of Birth: 1940-12-29 Referring Provider:   [name] Pulmonary Rehab Walk Test from 08/11/2022 in [place]  Referring Provider [name]       Encounter Date: 08/29/2022  Check In:  Session Check In - 08/29/22 1126       Check-In   Supervising physician immediately available to respond to emergencies [facility] - Physician supervision    Physician(s) Erskine Emery    [facility]    Staff Present Elmon Else, MS, ACSM-CEP, Exercise Physiologist;Lisa Ysidro Evert, RN;Randi Olen Cordial BS, ACSM-CEP, Exercise Physiologist;Samantha [place], RD, LDN    Virtual Visit No    Medication changes reported     No    Fall or balance concerns reported    No    Warm-up and Cool-down Performed as group-led instruction    Resistance Training Performed Yes    VAD Patient? No    PAD/SET Patient? No      Pain Assessment   Currently in Pain? No/denies             Capillary Blood Glucose: No results found for this or any previous visit (from the past 24 hour(s)).    Social History   Tobacco Use  Smoking Status Former   Packs/day: 1.00   Years: 8.00   Total pack years: 8.00   Types: Cigarettes   Quit date: 11/21/1983   Years since quitting: 38.7  Smokeless Tobacco Never    Goals Met:  Exercise tolerated well No report of concerns or symptoms today Strength training completed today  Goals Unmet:  Not Applicable  Comments: Service time is from 1013 to 1147. Pt O2 sat 83% 3L. Increased oxygen to 4L. O2 sat 94% on 4L   Dr. Rodman Pickle is Medical Director for Pulmonary Rehab at [facility].

## 2022-08-31 ENCOUNTER — Encounter (HOSPITAL_COMMUNITY)
Admission: RE | Admit: 2022-08-31 | Discharge: 2022-08-31 | Disposition: A | Discharge status: Home / Self Care | Payer: Medicare Other | Source: Ambulatory Visit | Attending: Pulmonary Disease | Admitting: Pulmonary Disease

## 2022-08-31 ENCOUNTER — Other Ambulatory Visit: Payer: Self-pay | Admitting: Physician Assistant

## 2022-08-31 ENCOUNTER — Other Ambulatory Visit: Payer: Self-pay | Admitting: Cardiovascular Disease

## 2022-08-31 DIAGNOSIS — J4550 Severe persistent asthma, uncomplicated: Secondary | ICD-10-CM

## 2022-08-31 NOTE — Progress Notes (Signed)
Daily Session Note  Patient Details  Name: [name] MRN: [id number] Date of Birth: [birth year] Referring Provider:   [name] Pulmonary Rehab Walk Test from 08/11/2022 in [place]  Referring Provider [name]       Encounter Date: 08/31/2022  Check In:  Session Check In - 08/31/22 1121       Check-In   Supervising physician immediately available to respond to emergencies [facility] - Physician supervision    Physician(s) Dr. Erskine Emery    [facility]    Staff Present Elmon Else, MS, ACSM-CEP, Exercise Physiologist;Lisa Ysidro Evert, RN;Randi Olen Cordial BS, ACSM-CEP, Exercise Physiologist    Medication changes reported     No    Fall or balance concerns reported    No    Tobacco Cessation No Change    Warm-up and Cool-down Performed as group-led instruction    Resistance Training Performed Yes    VAD Patient? No    PAD/SET Patient? No      Pain Assessment   Currently in Pain? No/denies    Multiple Pain Sites No             Capillary Blood Glucose: No results found for this or any previous visit (from the past 24 hour(s)).    Social History   Tobacco Use  Smoking Status Former   Packs/day: 1.00   Years: 8.00   Total pack years: 8.00   Types: Cigarettes   Quit date: 11/21/1983   Years since quitting: 38.8  Smokeless Tobacco Never    Goals Met:  Exercise tolerated well No report of concerns or symptoms today Strength training completed today  Goals Unmet:  Not Applicable  Comments: Service time is from 1013 to 1145.    Dr. Rodman Pickle is Medical Director for Pulmonary Rehab at [facility].

## 2022-09-05 ENCOUNTER — Encounter (HOSPITAL_COMMUNITY)
Admission: RE | Admit: 2022-09-05 | Discharge: 2022-09-05 | Disposition: A | Discharge status: Home / Self Care | Payer: Medicare Other | Source: Ambulatory Visit | Attending: Pulmonary Disease | Admitting: Pulmonary Disease

## 2022-09-05 VITALS — Wt 138.4 lb

## 2022-09-05 DIAGNOSIS — J4550 Severe persistent asthma, uncomplicated: Secondary | ICD-10-CM | POA: Diagnosis not present

## 2022-09-05 NOTE — Progress Notes (Signed)
Daily Session Note  Patient Details  Name: [name] MRN: [id number] Date of Birth: 26-Jan-1941 Referring Provider:   [name] Pulmonary Rehab Walk Test from 08/11/2022 in [place]  Referring Provider [name]       Encounter Date: 09/05/2022  Check In:  Session Check In - 09/05/22 1036       Check-In   Supervising physician immediately available to respond to emergencies [facility] - Physician supervision    Physician(s) Chand    Staff Present Elmon Else, MS, ACSM-CEP, Exercise Physiologist;Adalene Gulotta Ysidro Evert, RN;Randi Olen Cordial BS, ACSM-CEP, Exercise Physiologist;Samantha [place], RD, LDN    Virtual Visit No    Medication changes reported     No    Fall or balance concerns reported    No    Tobacco Cessation No Change    Warm-up and Cool-down Performed as group-led instruction    Resistance Training Performed Yes    VAD Patient? No    PAD/SET Patient? No      Pain Assessment   Currently in Pain? No/denies             Capillary Blood Glucose: No results found for this or any previous visit (from the past 24 hour(s)).   Exercise Prescription Changes - 09/05/22 1200       Response to Exercise   Blood Pressure (Admit) 108/60    Blood Pressure (Exercise) 134/78    Blood Pressure (Exit) 102/66    Heart Rate (Admit) 77 bpm    Heart Rate (Exercise) 82 bpm    Heart Rate (Exit) 76 bpm    Oxygen Saturation (Admit) 94 %    Oxygen Saturation (Exercise) 90 %    Oxygen Saturation (Exit) 94 %    Rating of Perceived Exertion (Exercise) 13    Perceived Dyspnea (Exercise) 1    Duration Progress to 30 minutes of  aerobic without signs/symptoms of physical distress    Intensity THRR unchanged      Progression   Progression Continue to progress workloads to maintain intensity without signs/symptoms of physical distress.      Resistance Training   Training Prescription Yes    Weight Red bands    Reps 10-15    Time 10 Minutes      Oxygen    Oxygen Continuous    Liters 3-4      NuStep   Level 2    SPM 60    Minutes 15    METs 1.7      Track   Laps 8    Minutes 15             Social History   Tobacco Use  Smoking Status Former   Packs/day: 1.00   Years: 8.00   Total pack years: 8.00   Types: Cigarettes   Quit date: 11/21/1983   Years since quitting: 38.8  Smokeless Tobacco Never    Goals Met:  Proper associated with RPD/PD & O2 Sat Exercise tolerated well No report of concerns or symptoms today Strength training completed today  Goals Unmet:  Not Applicable  Comments: Service time is from 1016 to 1135 1016   Dr. Rodman Pickle is Medical Director for Pulmonary Rehab at [facility].

## 2022-09-07 ENCOUNTER — Encounter (HOSPITAL_COMMUNITY)
Admission: RE | Admit: 2022-09-07 | Discharge: 2022-09-07 | Disposition: A | Discharge status: Home / Self Care | Payer: Medicare Other | Source: Ambulatory Visit | Attending: Pulmonary Disease | Admitting: Pulmonary Disease

## 2022-09-07 ENCOUNTER — Other Ambulatory Visit: Payer: Self-pay | Admitting: Cardiovascular Disease

## 2022-09-07 DIAGNOSIS — J4550 Severe persistent asthma, uncomplicated: Secondary | ICD-10-CM | POA: Diagnosis not present

## 2022-09-07 NOTE — Progress Notes (Signed)
Daily Session Note  Patient Details  Name: [name] MRN: [id number] Date of Birth: [birth year] Referring Provider:   [name] Pulmonary Rehab Walk Test from 08/11/2022 in [place]  Referring Provider [name]       Encounter Date: 09/07/2022  Check In:  Session Check In - 09/07/22 1129       Check-In   Supervising physician immediately available to respond to emergencies [facility] - Physician supervision    Physician(s) Tacy Learn    [facility]    Staff Present Elmon Else, MS, ACSM-CEP, Exercise Physiologist;Samantha [place], RD, LDN;Lisa Ysidro Evert, RN;Randi [facility] BS, ACSM-CEP, Exercise Physiologist    Virtual Visit No    Medication changes reported     No    Fall or balance concerns reported    No    Tobacco Cessation No Change    Warm-up and Cool-down Performed as group-led Higher education careers adviser Performed Yes    VAD Patient? No    PAD/SET Patient? No      Pain Assessment   Currently in Pain? No/denies             Capillary Blood Glucose: No results found for this or any previous visit (from the past 24 hour(s)).    Social History   Tobacco Use  Smoking Status Former   Packs/day: 1.00   Years: 8.00   Total pack years: 8.00   Types: Cigarettes   Quit date: 11/21/1983   Years since quitting: 38.8  Smokeless Tobacco Never    Goals Met:  Proper associated with RPD/PD & O2 Sat Exercise tolerated well No report of concerns or symptoms today Strength training completed today  Goals Unmet:  Not Applicable  Comments: Service time is from 1007 to 1145.    Dr. Rodman Pickle is Medical Director for Pulmonary Rehab at [facility].

## 2022-09-12 ENCOUNTER — Encounter (HOSPITAL_COMMUNITY)
Admission: RE | Admit: 2022-09-12 | Discharge: 2022-09-12 | Disposition: A | Discharge status: Home / Self Care | Payer: Medicare Other | Source: Ambulatory Visit | Attending: Pulmonary Disease | Admitting: Pulmonary Disease

## 2022-09-12 DIAGNOSIS — J4550 Severe persistent asthma, uncomplicated: Secondary | ICD-10-CM

## 2022-09-12 NOTE — Progress Notes (Signed)
Discharge Progress Report  Patient Details  Name: [name] MRN: [id number] Date of Birth: [birth year] Referring Provider:   [name] Walk Test from 08/11/2022 in [place]  Referring Provider [name]        Number of Visits: 12  Reason for Discharge:  Patient has met program and personal goals.  Smoking History:  Social History   Tobacco Use  Smoking Status Former   Packs/day: 1.00   Years: 8.00   Total pack years: 8.00   Types: Cigarettes   Quit date: 11/21/1983   Years since quitting: 38.8  Smokeless Tobacco Never    Diagnosis:  Severe persistent asthma without complication  ADL [facility]:  Pulmonary Assessment Scores     Row Name 08/11/22 1437 08/11/22 1613       ADL [facility]   SOB Score total -- 90      CAT Score   CAT Score -- 25      mMRC Score   mMRC Score 4 4             Initial Exercise Prescription:  Initial Exercise Prescription - 08/11/22 1600       Date of Initial Exercise RX and Referring Provider   Date 08/11/22    Referring Provider [name]    Expected Discharge Date 10/19/22      Oxygen   Oxygen Continuous    Liters 2    Maintain Oxygen Saturation 88% or higher      NuStep   Level 1    SPM 60    Minutes 15      Track   Minutes 15    METs 2      Prescription Details   Frequency (times per week) 2    Duration Progress to 30 minutes of continuous aerobic without signs/symptoms of physical distress      Intensity   THRR 40-80% of Max Heartrate 56-113    Ratings of Perceived Exertion 11-13    Perceived Dyspnea 0-4      Progression   Progression Continue progressive overload as per policy without signs/symptoms or physical distress.      Resistance Training   Training Prescription Yes    Weight red bands    Reps 10-15             Discharge Exercise Prescription (Final Exercise Prescription Changes):  Exercise Prescription Changes - 09/05/22 1200        Response to Exercise   Blood Pressure (Admit) 108/60    Blood Pressure (Exercise) 134/78    Blood Pressure (Exit) 102/66    Heart Rate (Admit) 77 bpm    Heart Rate (Exercise) 82 bpm    Heart Rate (Exit) 76 bpm    Oxygen Saturation (Admit) 94 %    Oxygen Saturation (Exercise) 90 %    Oxygen Saturation (Exit) 94 %    Rating of Perceived Exertion (Exercise) 13    Perceived Dyspnea (Exercise) 1    Duration Progress to 30 minutes of  aerobic without signs/symptoms of physical distress    Intensity THRR unchanged      Progression   Progression Continue to progress workloads to maintain intensity without signs/symptoms of physical distress.      Resistance Training   Training Prescription Yes    Weight Red bands    Reps 10-15    Time 10 Minutes      Oxygen   Oxygen Continuous    Liters 3-4  NuStep   Level 2    SPM 60    Minutes 15    METs 1.7      Track   Laps 8    Minutes 15             Functional Capacity:  6 Minute Walk     Row Name 08/11/22 1619         6 Minute Walk   Phase Initial     Distance 562 feet     Walk Time 6 minutes     # of Rest Breaks 1     MPH 1.06     METS 2.2     RPE 12     Perceived Dyspnea  2     VO2 Peak 7.69     Symptoms Yes (comment)     Comments SOB and fatigue, standing rest 2:00 min to 2:23     Resting HR 85 bpm     Resting BP 140/60     Resting Oxygen Saturation  92 %     Exercise Oxygen Saturation  during 6 min walk 90 %     Max Ex. HR 88 bpm     Max Ex. BP 144/62     2 Minute Post BP 138/66       Interval HR   1 Minute HR 88     2 Minute HR 87     3 Minute HR 85     4 Minute HR 86     5 Minute HR 85     6 Minute HR 88     2 Minute Post HR 68     Interval Heart Rate? Yes       Interval Oxygen   Interval Oxygen? Yes     Baseline Oxygen Saturation % 92 %     1 Minute Oxygen Saturation % 96 %     1 Minute Liters of Oxygen 2 L     2 Minute Oxygen Saturation % 94 %     2 Minute Liters of Oxygen 0 L     3  Minute Oxygen Saturation % 92 %     3 Minute Liters of Oxygen 2 L     4 Minute Oxygen Saturation % 94 %     4 Minute Liters of Oxygen 2 L     5 Minute Oxygen Saturation % 90 %     5 Minute Liters of Oxygen 2 L     6 Minute Oxygen Saturation % 92 %     6 Minute Liters of Oxygen 2 L     2 Minute Post Oxygen Saturation % 91 %     2 Minute Post Liters of Oxygen 2 L              Psychological, QOL, Others - Outcomes: PHQ 2/9:    08/11/2022    2:16 PM 09/26/2018   11:27 AM 07/01/2018   10:58 AM  Depression screen PHQ 2/9  Decreased Interest 0 0 0  Down, Depressed, Hopeless 2 1 0  PHQ - 2 Score 2 1 0  Altered sleeping 0 0 0  Tired, decreased energy _0 Change in appetite 1 0 0  Feeling bad or failure about yourself  0 1 0  Trouble concentrating 1 0 0  Moving slowly or fidgety/restless 1 0 0  Suicidal thoughts 0    PHQ-9 Score _1 Difficult doing work/chores Not difficult at all  Not difficult at all Not difficult at all    Quality of Life:   Personal Goals: Goals established at orientation with interventions provided to work toward goal.  Personal Goals and Risk Factors at Admission - 08/11/22 1430       Core Components/Risk Factors/Patient Goals on Admission   Improve shortness of breath with ADL's Yes    Intervention Provide education, individualized exercise plan and daily activity instruction to help decrease symptoms of SOB with activities of daily living.    Expected Outcomes Short Term: Improve cardiorespiratory fitness to achieve a reduction of symptoms when performing ADLs;Long Term: Be able to perform more ADLs without symptoms or delay the onset of symptoms    Hypertension Yes    Intervention Provide education on lifestyle modifcations including regular physical activity/exercise, weight management, moderate sodium restriction and increased consumption of fresh fruit, vegetables, and low fat dairy, alcohol moderation, and smoking cessation.;Monitor  prescription use compliance.    Expected Outcomes Short Term: Continued assessment and intervention until BP is < 140/32mm HG in hypertensive participants. < 130/56mm HG in hypertensive participants with diabetes, heart failure or chronic kidney disease.;Long Term: Maintenance of blood pressure at goal levels.              Personal Goals Discharge:  Goals and Risk Factor Review     Row Name 08/16/22 0825 09/07/22 1628           Core Components/Risk Factors/Patient Goals Review   Personal Goals Review Hypertension;Develop more efficient breathing techniques such as purse lipped breathing and diaphragmatic breathing and practicing self-pacing with activity.;Improve shortness of breath with ADL's Hypertension;Develop more efficient breathing techniques such as purse lipped breathing and diaphragmatic breathing and practicing self-pacing with activity.;Improve shortness of breath with ADL's      Review Pt is scheduled to begin exercise this week. Will continue to assess. Greer has completed 7 exercise sessions. She feels that her functional capacity has improved since starting rehab. She is very motivated to exercise. Her METs on the Nustep have remained relatively the same although her track laps have increased. Her blood pressure has decreased since starting. Her last blood pressure was 114/60.      Expected Outcomes See admission goals. See admission goals.               Exercise Goals and Review:  Exercise Goals     Row Name 08/11/22 1435 08/14/22 0748 09/04/22 1145         Exercise Goals   Increase Physical Activity Yes Yes Yes     Intervention Provide advice, education, support and counseling about physical activity/exercise needs.;Develop an individualized exercise prescription for aerobic and resistive training based on initial evaluation findings, risk stratification, comorbidities and participant's personal goals. Provide advice, education, support and counseling about  physical activity/exercise needs.;Develop an individualized exercise prescription for aerobic and resistive training based on initial evaluation findings, risk stratification, comorbidities and participant's personal goals. Provide advice, education, support and counseling about physical activity/exercise needs.;Develop an individualized exercise prescription for aerobic and resistive training based on initial evaluation findings, risk stratification, comorbidities and participant's personal goals.     Expected Outcomes Short Term: Attend rehab on a regular basis to increase amount of physical activity.;Long Term: Add in home exercise to make exercise part of routine and to increase amount of physical activity.;Long Term: Exercising regularly at least 3-5 days a week. Short Term: Attend rehab on a regular basis to increase amount of physical activity.;Long Term: Add in home  exercise to make exercise part of routine and to increase amount of physical activity.;Long Term: Exercising regularly at least 3-5 days a week. Short Term: Attend rehab on a regular basis to increase amount of physical activity.;Long Term: Add in home exercise to make exercise part of routine and to increase amount of physical activity.;Long Term: Exercising regularly at least 3-5 days a week.     Increase Strength and Stamina Yes Yes Yes     Intervention Provide advice, education, support and counseling about physical activity/exercise needs.;Develop an individualized exercise prescription for aerobic and resistive training based on initial evaluation findings, risk stratification, comorbidities and participant's personal goals. Provide advice, education, support and counseling about physical activity/exercise needs.;Develop an individualized exercise prescription for aerobic and resistive training based on initial evaluation findings, risk stratification, comorbidities and participant's personal goals. Provide advice, education, support and  counseling about physical activity/exercise needs.;Develop an individualized exercise prescription for aerobic and resistive training based on initial evaluation findings, risk stratification, comorbidities and participant's personal goals.     Expected Outcomes Short Term: Increase workloads from initial exercise prescription for resistance, speed, and METs.;Short Term: Perform resistance training exercises routinely during rehab and add in resistance training at home;Long Term: Improve cardiorespiratory fitness, muscular endurance and strength as measured by increased METs and functional capacity (6MWT) Short Term: Increase workloads from initial exercise prescription for resistance, speed, and METs.;Short Term: Perform resistance training exercises routinely during rehab and add in resistance training at home;Long Term: Improve cardiorespiratory fitness, muscular endurance and strength as measured by increased METs and functional capacity (6MWT) Short Term: Increase workloads from initial exercise prescription for resistance, speed, and METs.;Short Term: Perform resistance training exercises routinely during rehab and add in resistance training at home;Long Term: Improve cardiorespiratory fitness, muscular endurance and strength as measured by increased METs and functional capacity (6MWT)     Able to understand and use rate of perceived exertion (RPE) scale Yes Yes Yes     Intervention Provide education and explanation on how to use RPE scale Provide education and explanation on how to use RPE scale Provide education and explanation on how to use RPE scale     Expected Outcomes Short Term: Able to use RPE daily in rehab to express subjective intensity level;Long Term:  Able to use RPE to guide intensity level when exercising independently Short Term: Able to use RPE daily in rehab to express subjective intensity level;Long Term:  Able to use RPE to guide intensity level when exercising independently Short  Term: Able to use RPE daily in rehab to express subjective intensity level;Long Term:  Able to use RPE to guide intensity level when exercising independently     Able to understand and use Dyspnea scale Yes Yes Yes     Intervention Provide education and explanation on how to use Dyspnea scale Provide education and explanation on how to use Dyspnea scale Provide education and explanation on how to use Dyspnea scale     Expected Outcomes Short Term: Able to use Dyspnea scale daily in rehab to express subjective sense of shortness of breath during exertion;Long Term: Able to use Dyspnea scale to guide intensity level when exercising independently Short Term: Able to use Dyspnea scale daily in rehab to express subjective sense of shortness of breath during exertion;Long Term: Able to use Dyspnea scale to guide intensity level when exercising independently Short Term: Able to use Dyspnea scale daily in rehab to express subjective sense of shortness of breath during exertion;Long Term: Able to  use Dyspnea scale to guide intensity level when exercising independently     Knowledge and understanding of Target Heart Rate Range (THRR) Yes Yes Yes     Intervention Provide education and explanation of THRR including how the numbers were predicted and where they are located for reference Provide education and explanation of THRR including how the numbers were predicted and where they are located for reference Provide education and explanation of THRR including how the numbers were predicted and where they are located for reference     Expected Outcomes Short Term: Able to state/look up THRR;Long Term: Able to use THRR to govern intensity when exercising independently;Short Term: Able to use daily as guideline for intensity in rehab Short Term: Able to state/look up THRR;Long Term: Able to use THRR to govern intensity when exercising independently;Short Term: Able to use daily as guideline for intensity in rehab Short Term:  Able to state/look up THRR;Long Term: Able to use THRR to govern intensity when exercising independently;Short Term: Able to use daily as guideline for intensity in rehab     Understanding of Exercise Prescription Yes Yes Yes     Intervention Provide education, explanation, and written materials on patient's individual exercise prescription Provide education, explanation, and written materials on patient's individual exercise prescription Provide education, explanation, and written materials on patient's individual exercise prescription     Expected Outcomes Short Term: Able to explain program exercise prescription;Long Term: Able to explain home exercise prescription to exercise independently Short Term: Able to explain program exercise prescription;Long Term: Able to explain home exercise prescription to exercise independently Short Term: Able to explain program exercise prescription;Long Term: Able to explain home exercise prescription to exercise independently              Exercise Goals Re-Evaluation:  Exercise Goals Re-Evaluation     Row Name 08/14/22 0752 09/04/22 1145           Exercise Goal Re-Evaluation   Exercise Goals Review Increase Physical Activity;Increase Strength and Stamina;Able to understand and use rate of perceived exertion (RPE) scale;Able to understand and use Dyspnea scale;Knowledge and understanding of Target Heart Rate Range (THRR);Understanding of Exercise Prescription Increase Physical Activity;Increase Strength and Stamina;Able to understand and use rate of perceived exertion (RPE) scale;Able to understand and use Dyspnea scale;Knowledge and understanding of Target Heart Rate Range (THRR);Understanding of Exercise Prescription      Comments Pt is scheduled to begin exercise this week. Jaana has completed 5 exercise sessions. She exercises on the Nustep and track for 15 min. Elleanor averages 1.7 METs at level 2 on the Nustep and 2.04 METs on the track. She performs  the warmup and cooldown standing/seated holding onto a chair dependent on her shortness of breath. Jozette is very deconditioned making it difficult to progress her. She is very motivated to exercise and increase her functional capacity. Will continue to monitor and progress as able.      Expected Outcomes Through exercise at rehab and home, the patient will decrease shortness of breath with daily activities and feel confident in carrying out an exercise regimen. Through exercise at rehab and home, the patient will decrease shortness of breath with daily activities and feel confident in carrying out an exercise regimen.               Nutrition & Weight - Outcomes:  Pre Biometrics - 08/11/22 1615       Pre Biometrics   Grip Strength 24 kg  Nutrition:  Nutrition Therapy & Goals - 09/07/22 1141       Nutrition Therapy   Diet Heart Healthy/Carbohydrate Consistent diet    Drug/Food Interactions Statins/Certain Fruits      Personal Nutrition Goals   Nutrition Goal Patient to use the plate method as a guide for daily meal planning to include lean protein/plant protein, fruits, vegetables, whole grains, and nonfat/lowfat dairy as part of heart healthy lifestyle    Personal Goal #2 Patient to understand strategies for weight gain/weight maintenance of 0.5-2.0# per week    Personal Goal #3 Patient to limit sodium intake to <1574m daily    Personal Goal #4 Patient to identify food sources and limit daily intake of saturated fat, trans fat, sodium, and refined carbohydrates    Comments Chanel's weight is down 1.1# over the last 4 weeks. [employer] increasing eating frequency to 4 meals per day up from 3 regular meals per day. Discussed increasing eating frequency to five meals per day and/or adding in nutrition supplements such as Ensure Clear twice daily (360kcals, 16 protein). She reports good appetite. Encouraged patient to follow-up with primary care regarding referral or  home health aid/social work to aid with ADLs. Patient reports continued support from her grandson.      Intervention Plan   Intervention Prescribe, educate and counsel regarding individualized specific dietary modifications aiming towards targeted core components such as weight, hypertension, lipid management, diabetes, heart failure and other comorbidities.;Nutrition handout(s) given to patient.    Expected Outcomes Short Term Goal: Understand basic principles of dietary content, such as calories, fat, sodium, cholesterol and nutrients.;Long Term Goal: Adherence to prescribed nutrition plan.             Nutrition Discharge:  Nutrition Assessments - 08/17/22 1053       Rate Your Plate Scores   Pre Score 48             Education Questionnaire Score:  Knowledge Questionnaire Score - 08/11/22 1615       Knowledge Questionnaire Score   Pre Score 10/18             Goals reviewed with patient; copy given to patient.

## 2022-09-12 NOTE — Progress Notes (Signed)
Daily Session Note  Patient Details  Name: [name] MRN: [id number] Date of Birth: 17-Apr-1941 Referring Provider:   April Ward [facility] Re[facility] Walk Test from 08/11/2022 in [place]  Referring Provider [name]       Encounter Date: 09/12/2022  Check In:  Session Check In - 09/12/22 1127       Check-In   Supervising physician immediately available to respond to emergencies [name] - Physician supervision    Physician(s) [name]    Location [facility]    Staff Present [name] Else, MS, ACSM-CEP, Exercise Physiologist;[name], RD, LDN;[name] [name] Evert, RN;[name], ACSM-CEP, Exercise Physiologist;[name] [place], MS, ACSM-CEP, CCRP, Exercise Physiologist    Virtual Visit No    Medication changes reported     No    Fall or balance concerns reported    No    Tobacco Cessation No Change    Warm-up and Cool-down Performed as group-led instruction    Resistance Training Performed Yes    VAD Patient? No    PAD/SET Patient? No      Pain Assessment   Currently in Pain? No/denies             Capillary Blood Glucose: No results found for this or any previous visit (from the past 24 hour(s)).    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

## 2022-09-13 NOTE — Progress Notes (Signed)
Pulmonary Individual Treatment Plan  Patient Details  Name: [name] MRN: [id number] Date of Birth: [birth year] Referring Provider:   [name] Pulmonary Rehab Walk Test from 08/11/2022 in [facility]  Referring Provider [name]       Initial Encounter Date:  Flowsheet Row Pulmonary Rehab Walk Test from 08/11/2022 in [facility]  Date 08/11/22       Visit Diagnosis: Severe persistent asthma without complication  Patient's Home Medications on Admission:   Current Outpatient Medications:    acetaminophen (TYLENOL) 650 MG CR tablet, Take 650 mg by mouth every morning., Disp: , Rfl:    albuterol (PROVENTIL) (2.5 MG/3ML) 0.083% nebulizer solution, Take 2.5 mg by nebulization every 6 (six) hours as needed for wheezing or shortness of breath., Disp: , Rfl:    albuterol (VENTOLIN HFA) 108 (90 Base) MCG/ACT inhaler, Inhale 1-2 puffs into the lungs every 6 (six) hours as needed for wheezing or shortness of breath., Disp: , Rfl:    amLODipine (NORVASC) 10 MG tablet, Take 10 mg by mouth daily. , Disp: , Rfl:    aspirin EC 81 MG tablet, Take 81 mg by mouth daily., Disp: , Rfl:    Azelastine HCl 0.15 % SOLN, Place 1 spray into both nostrils daily as needed for allergies., Disp: , Rfl:    budesonide (PULMICORT) 0.25 MG/2ML nebulizer solution, Inhale 0.25 mg into the lungs as needed., Disp: , Rfl:    calcitRIOL (ROCALTROL) 0.25 MCG capsule, Take 0.25 mcg by mouth daily. , Disp: , Rfl:    ezetimibe (ZETIA) 10 MG tablet, TAKE 1 TABLET BY MOUTH EVERY DAY, Disp: 30 tablet, Rfl: 0   fluticasone (FLONASE) 50 MCG/ACT nasal spray, Place into both nostrils., Disp: , Rfl:    fluticasone furoate-vilanterol (BREO ELLIPTA) 200-25 MCG/INH AEPB, Inhale 1 puff into the lungs daily as needed (respiratory issues.). , Disp: , Rfl:    glucose blood (ONETOUCH VERIO) test strip, 1 each by Other route daily in the afternoon. Use as instructed, Disp:  100 each, Rfl: 3   isosorbide mononitrate (IMDUR) 30 MG 24 hr tablet, TAKE 1 TABLET BY MOUTH EVERY DAY, Disp: 90 tablet, Rfl: 2   KLOR-CON M20 20 MEQ tablet, TAKE 1 TABLET BY MOUTH EVERY DAY, Disp: 90 tablet, Rfl: 2   metoprolol tartrate (LOPRESSOR) 50 MG tablet, TAKE 1 TABLET BY MOUTH TWICE A DAY, Disp: 180 tablet, Rfl: 0   nitroGLYCERIN (NITROSTAT) 0.4 MG SL tablet, Place 1 tablet (0.4 mg total) under the tongue every 5 (five) minutes as needed for chest pain., Disp: 25 tablet, Rfl: 1   omeprazole (PRILOSEC) 20 MG capsule, Take 20 mg by mouth daily after breakfast., Disp: , Rfl:    predniSONE (DELTASONE) 5 MG tablet, Take 5 mg by mouth 2 (two) times daily., Disp: , Rfl:    rosuvastatin (CRESTOR) 40 MG tablet, Take 1 tablet (40 mg total) by mouth daily. Pt needs to make appt with provider for further refills - 2nd attempt, Disp: 15 tablet, Rfl: 0   sennosides-docusate sodium (SENOKOT-S) 8.6-50 MG tablet, Take 1 tablet by mouth daily as needed (constipation.). , Disp: , Rfl:    tacrolimus (PROGRAF) 1 MG capsule, Take 5 mg by mouth See admin instructions. 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, hold ASA 2-3 weeks - consider plavix instead of ASA   Asthma    Cellulitis 12/19/2019   right upper arm   Coronary artery disease    cath 11/2007, grafts patent /  Nuclear, June, 2011, prior inferior MI with mild peri-infarct ischemia, anterior breast attenuation, EF 67%, done for renal transplant assessment / Low level exercise/Lexiscan Myoview (11/2013): EF 67%, no ischemi; normal study   Diabetes mellitus    type 2   ESRD on dialysis [facility])    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, 2012   Family history of breast cancer    Family history of prostate cancer     GERD (gastroesophageal reflux disease)    GI bleed 11/26/2010   January, 2012 , AVM   Gout    History of methicillin resistant staphylococcus aureus (MRSA)    Hyperlipidemia    Hyperparathyroidism    Hypertension    Myocardial infarction [facility])    Osteoporosis    Pneumonia 08/2010    Hospitalization, October, 2011   Primary osteoarthritis, 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   S/P kidney transplant    September, 2012, [facility]   Subacromial impingement of 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    Tobacco Use: Social History   Tobacco Use  Smoking Status Former   Packs/day: 1.00   Years: 8.00   Total pack years: 8.00   Types: Cigarettes   Quit date: 11/21/1983   Years since quitting: 38.8  Smokeless Tobacco Never    Labs: Review Flowsheet  More data exists      Latest Ref Rng & Units 10/17/2021 11/03/2021 12/27/2021 02/22/2022 07/10/2022  Labs for ITP Cardiac and Pulmonary Rehab  Cholestrol 100 - 199 mg/dL 138  - - - -  LDL (calc) 0 - 99 mg/dL 57  - - - -  HDL-C >39 mg/dL 65  - - - -  Trlycerides 0 - 149 mg/dL 82  - - - -  Hemoglobin A1c 4.0 - 5.6 % - 6.5  - 6.1  6.4   TCO2 22 - 32 mmol/L - - 24  - -    Capillary Blood Glucose: Lab Results  Component Value Date   GLUCAP 115 (H) 08/17/2022   GLUCAP 96 08/17/2022   GLUCAP 109 (H) 08/11/2022   GLUCAP 91 12/27/2021   GLUCAP 153 (H) 02/21/2018    POCT Glucose     Row Name 08/22/22 1220             POCT Blood Glucose   Pre-Exercise 104 mg/dL       Post-Exercise 130 mg/dL                Pulmonary Assessment Scores:  Pulmonary Assessment Scores     Row Name 08/11/22 1437 08/11/22 1613       ADL UCSD   SOB Score total -- 90      CAT Score   CAT Score -- 25      mMRC Score   mMRC Score 4 4            UCSD: Self-administered rating of dyspnea associated with activities of daily living (ADLs) 6-point scale (0 = "not at all" to 5 = "maximal or unable to do because of breathlessness")  Scoring Scores range from 0 to 120.   Minimally important difference is 5 units  CAT: CAT can identify the health impairment of COPD patients and  is better correlated with disease progression.  CAT has a scoring range of zero to 40. The CAT score is classified into four groups of low (less than 10), medium (10 - 20), high (21-30) and very high (31-40) based on the impact level of disease on health status. A CAT score over 10 suggests significant symptoms.  A worsening CAT score could be explained by an exacerbation, poor medication adherence, poor inhaler technique, or progression of COPD or comorbid conditions.  CAT MCID is 2 points  mMRC: mMRC (Modified Medical Research Council) Dyspnea Scale is used to assess the degree of baseline functional disability in patients of respiratory disease due to dyspnea. No minimal important difference is established. A decrease in score of 1 point or greater is considered a positive change.   Pulmonary Function Assessment:   Exercise Target Goals: Exercise Program Goal: Individual exercise prescription set using results from initial 6 min walk test and THRR while considering  patient's activity barriers and safety.   Exercise Prescription Goal: Initial exercise prescription builds to 30-45 minutes a day of aerobic activity, 2-3 days per week.  Home exercise guidelines will be given to patient during program as part of exercise prescription that the participant will acknowledge.  Activity Barriers & Risk Stratification:  Activity Barriers & Cardiac Risk Stratification - 08/11/22 1434       Activity Barriers & Cardiac Risk Stratification   Activity Barriers Left Knee Replacement;Shortness of Breath;Left Hip Replacement;Muscular Weakness;Deconditioning;Balance Concerns    Cardiac Risk Stratification High             6 Minute Walk:  6 Minute Walk     Row Name 08/11/22 1619         6 Minute Walk   Phase Initial     Distance 562 feet     Walk Time 6 minutes     # of Rest Breaks  1     MPH 1.06     METS 2.2     RPE 12     Perceived Dyspnea  2     VO2 Peak 7.69     Symptoms Yes (comment)     Comments SOB and fatigue, standing rest 2:00 min to 2:23     Resting HR 85 bpm     Resting BP 140/60     Resting Oxygen Saturation  92 %     Exercise Oxygen Saturation  during 6 min walk 90 %     Max Ex. HR 88 bpm     Max Ex. BP 144/62     2 Minute Post BP 138/66       Interval HR   1 Minute HR 88     2 Minute HR 87     3 Minute HR 85     4 Minute HR 86     5 Minute HR 85     6 Minute HR 88     2 Minute Post HR 68     Interval Heart Rate? Yes       Interval Oxygen   Interval Oxygen? Yes     Baseline Oxygen Saturation % 92 %     1 Minute Oxygen Saturation % 96 %     1 Minute Liters of Oxygen 2 L     2 Minute Oxygen Saturation % 94 %     2 Minute Liters of Oxygen 0 L     3 Minute Oxygen Saturation % 92 %     3 Minute  Liters of Oxygen 2 L     4 Minute Oxygen Saturation % 94 %     4 Minute Liters of Oxygen 2 L     5 Minute Oxygen Saturation % 90 %     5 Minute Liters of Oxygen 2 L     6 Minute Oxygen Saturation % 92 %     6 Minute Liters of Oxygen 2 L     2 Minute Post Oxygen Saturation % 91 %     2 Minute Post Liters of Oxygen 2 L              Oxygen Initial Assessment:  Oxygen Initial Assessment - 08/11/22 1431       Home Oxygen   Home Oxygen Device Home Concentrator    Sleep Oxygen Prescription Continuous    Liters per minute 2    Home Exercise Oxygen Prescription Continuous    Liters per minute 2    Home Resting Oxygen Prescription Continuous    Liters per minute 2    Compliance with Home Oxygen Use Yes      Initial 6 min Walk   Oxygen Used Continuous    Liters per minute 2      Program Oxygen Prescription   Program Oxygen Prescription Continuous    Liters per minute 2      Intervention   Short Term Goals To learn and exhibit compliance with exercise, home and travel O2 prescription;To learn and understand importance of monitoring  SPO2 with pulse oximeter and demonstrate accurate use of the pulse oximeter.;To learn and understand importance of maintaining oxygen saturations>88%;To learn and demonstrate proper pursed lip breathing techniques or other breathing techniques. ;To learn and demonstrate proper use of respiratory medications    Long  Term Goals Demonstrates proper use of MDI's;Compliance with respiratory medication;Exhibits proper breathing techniques, such as pursed lip breathing or other method taught during program session;Maintenance of O2 saturations>88%;Verbalizes importance of monitoring SPO2 with pulse oximeter and return demonstration;Exhibits compliance with exercise, home  and travel O2 prescription             Oxygen Re-Evaluation:  Oxygen Re-Evaluation     Row Name 08/14/22 0749 09/04/22 1148           Program Oxygen Prescription   Program Oxygen Prescription Continuous Continuous      Liters per minute 2 4      Comments -- O2 increase for track walking. O2 sat is 91% on 4L.        Home Oxygen   Home Oxygen Device Home Concentrator Home Concentrator      Sleep Oxygen Prescription Continuous Continuous      Liters per minute 2 2      Home Exercise Oxygen Prescription Continuous Continuous      Liters per minute 2 2      Home Resting Oxygen Prescription Continuous Continuous      Liters per minute 2 2      Compliance with Home Oxygen Use Yes Yes        Goals/Expected Outcomes   Short Term Goals To learn and exhibit compliance with exercise, home and travel O2 prescription;To learn and understand importance of monitoring SPO2 with pulse oximeter and demonstrate accurate use of the pulse oximeter.;To learn and understand importance of maintaining oxygen saturations>88%;To learn and demonstrate proper pursed lip breathing techniques or other breathing techniques. ;To learn and demonstrate proper use of respiratory medications To learn and exhibit compliance with exercise, home and  travel O2  prescription;To learn and understand importance of monitoring SPO2 with pulse oximeter and demonstrate accurate use of the pulse oximeter.;To learn and understand importance of maintaining oxygen saturations>88%;To learn and demonstrate proper pursed lip breathing techniques or other breathing techniques. ;To learn and demonstrate proper use of respiratory medications      Long  Term Goals Demonstrates proper use of MDI's;Compliance with respiratory medication;Exhibits proper breathing techniques, such as pursed lip breathing or other method taught during program session;Maintenance of O2 saturations>88%;Verbalizes importance of monitoring SPO2 with pulse oximeter and return demonstration;Exhibits compliance with exercise, home  and travel O2 prescription Demonstrates proper use of MDI's;Compliance with respiratory medication;Exhibits proper breathing techniques, such as pursed lip breathing or other method taught during program session;Maintenance of O2 saturations>88%;Verbalizes importance of monitoring SPO2 with pulse oximeter and return demonstration;Exhibits compliance with exercise, home  and travel O2 prescription      Goals/Expected Outcomes Compliance and understanding of oxygen saturation monitoring and breathing techniques to decrease shortness of breath. Compliance and understanding of oxygen saturation monitoring and breathing techniques to decrease shortness of breath.               Oxygen Discharge (Final Oxygen Re-Evaluation):  Oxygen Re-Evaluation - 09/04/22 1148       Program Oxygen Prescription   Program Oxygen Prescription Continuous    Liters per minute 4    Comments O2 increase for track walking. O2 sat is 91% on 4L.      Home Oxygen   Home Oxygen Device Home Concentrator    Sleep Oxygen Prescription Continuous    Liters per minute 2    Home Exercise Oxygen Prescription Continuous    Liters per minute 2    Home Resting Oxygen Prescription Continuous    Liters per  minute 2    Compliance with Home Oxygen Use Yes      Goals/Expected Outcomes   Short Term Goals To learn and exhibit compliance with exercise, home and travel O2 prescription;To learn and understand importance of monitoring SPO2 with pulse oximeter and demonstrate accurate use of the pulse oximeter.;To learn and understand importance of maintaining oxygen saturations>88%;To learn and demonstrate proper pursed lip breathing techniques or other breathing techniques. ;To learn and demonstrate proper use of respiratory medications    Long  Term Goals Demonstrates proper use of MDI's;Compliance with respiratory medication;Exhibits proper breathing techniques, such as pursed lip breathing or other method taught during program session;Maintenance of O2 saturations>88%;Verbalizes importance of monitoring SPO2 with pulse oximeter and return demonstration;Exhibits compliance with exercise, home  and travel O2 prescription    Goals/Expected Outcomes Compliance and understanding of oxygen saturation monitoring and breathing techniques to decrease shortness of breath.             Initial Exercise Prescription:  Initial Exercise Prescription - 08/11/22 1600       Date of Initial Exercise RX and Referring Provider   Date 08/11/22    Referring Provider [name]    Expected Discharge Date 10/19/22      Oxygen   Oxygen Continuous    Liters 2    Maintain Oxygen Saturation 88% or higher      NuStep   Level 1    SPM 60    Minutes 15      Track   Minutes 15    METs 2      Prescription Details   Frequency (times per week) 2    Duration Progress to 30 minutes of continuous aerobic without signs/symptoms of  physical distress      Intensity   THRR 40-80% of Max Heartrate 56-113    Ratings of Perceived Exertion 11-13    Perceived Dyspnea 0-4      Progression   Progression Continue progressive overload as per policy without signs/symptoms or physical distress.      Resistance Training   Training  Prescription Yes    Weight red bands    Reps 10-15             Perform Capillary Blood Glucose checks as needed.  Exercise Prescription Changes:   Exercise Prescription Changes     Row Name 08/22/22 1200 09/05/22 1200           Response to Exercise   Blood Pressure (Admit) 114/64 108/60      Blood Pressure (Exercise) 168/80 134/78      Blood Pressure (Exit) 136/58 102/66      Heart Rate (Admit) 83 bpm 77 bpm      Heart Rate (Exercise) 92 bpm 82 bpm      Heart Rate (Exit) 90 bpm 76 bpm      Oxygen Saturation (Admit) 93 % 94 %      Oxygen Saturation (Exercise) 88 % 90 %      Oxygen Saturation (Exit) 90 % 94 %      Rating of Perceived Exertion (Exercise) 13 13      Perceived Dyspnea (Exercise) 2 1      Duration Progress to 30 minutes of  aerobic without signs/symptoms of physical distress Progress to 30 minutes of  aerobic without signs/symptoms of physical distress      Intensity Other (comment)  40-80% of HRR THRR unchanged        Progression   Progression Continue to progress workloads to maintain intensity without signs/symptoms of physical distress. Continue to progress workloads to maintain intensity without signs/symptoms of physical distress.        Resistance Training   Training Prescription Yes Yes      Weight Red bands Red bands      Reps 10-15 10-15      Time -- 10 Minutes        Oxygen   Oxygen Continuous Continuous      Liters 2 3-4        NuStep   Level 1 2      SPM 60 60      Minutes 15 15      METs 1.7 1.7        Track   Laps 4 8      Minutes 15 15               Exercise Comments:   Exercise Comments     Row Name 08/17/22 1214           Exercise Comments Pt completed first day of exercise. She exercised for 15 min on the [place] and track. [name] averaged 1.5 METs at level 1 on the [place]ust[place] and 1.5 METs on the track. [name] performed the warmup and cooldown standing holding onto a chair with verbal cues. Discussed METs and how to  increase METs.                Exercise Goals and Review:   Exercise Goals     Row Name 08/11/22 1435 08/14/22 0748 09/04/22 1145         Exercise Goals   Increase Physical Activity Yes Yes Yes     Intervention Provide advice,  education, support and counseling about physical activity/exercise needs.;Develop an individualized exercise prescription for aerobic and resistive training based on initial evaluation findings, risk stratification, comorbidities and participant's personal goals. Provide advice, education, support and counseling about physical activity/exercise needs.;Develop an individualized exercise prescription for aerobic and resistive training based on initial evaluation findings, risk stratification, comorbidities and participant's personal goals. Provide advice, education, support and counseling about physical activity/exercise needs.;Develop an individualized exercise prescription for aerobic and resistive training based on initial evaluation findings, risk stratification, comorbidities and participant's personal goals.     Expected Outcomes Short Term: Attend rehab on a regular basis to increase amount of physical activity.;Long Term: Add in home exercise to make exercise part of routine and to increase amount of physical activity.;Long Term: Exercising regularly at least 3-5 days a week. Short Term: Attend rehab on a regular basis to increase amount of physical activity.;Long Term: Add in home exercise to make exercise part of routine and to increase amount of physical activity.;Long Term: Exercising regularly at least 3-5 days a week. Short Term: Attend rehab on a regular basis to increase amount of physical activity.;Long Term: Add in home exercise to make exercise part of routine and to increase amount of physical activity.;Long Term: Exercising regularly at least 3-5 days a week.     Increase Strength and Stamina Yes Yes Yes     Intervention Provide advice, education, support  and counseling about physical activity/exercise needs.;Develop an individualized exercise prescription for aerobic and resistive training based on initial evaluation findings, risk stratification, comorbidities and participant's personal goals. Provide advice, education, support and counseling about physical activity/exercise needs.;Develop an individualized exercise prescription for aerobic and resistive training based on initial evaluation findings, risk stratification, comorbidities and participant's personal goals. Provide advice, education, support and counseling about physical activity/exercise needs.;Develop an individualized exercise prescription for aerobic and resistive training based on initial evaluation findings, risk stratification, comorbidities and participant's personal goals.     Expected Outcomes Short Term: Increase workloads from initial exercise prescription for resistance, speed, and METs.;Short Term: Perform resistance training exercises routinely during rehab and add in resistance training at home;Long Term: Improve cardiorespiratory fitness, muscular endurance and strength as measured by increased METs and functional capacity (6MWT) Short Term: Increase workloads from initial exercise prescription for resistance, speed, and METs.;Short Term: Perform resistance training exercises routinely during rehab and add in resistance training at home;Long Term: Improve cardiorespiratory fitness, muscular endurance and strength as measured by increased METs and functional capacity (6MWT) Short Term: Increase workloads from initial exercise prescription for resistance, speed, and METs.;Short Term: Perform resistance training exercises routinely during rehab and add in resistance training at home;Long Term: Improve cardiorespiratory fitness, muscular endurance and strength as measured by increased METs and functional capacity (6MWT)     Able to understand and use rate of perceived exertion (RPE) scale  Yes Yes Yes     Intervention Provide education and explanation on how to use RPE scale Provide education and explanation on how to use RPE scale Provide education and explanation on how to use RPE scale     Expected Outcomes Short Term: Able to use RPE daily in rehab to express subjective intensity level;Long Term:  Able to use RPE to guide intensity level when exercising independently Short Term: Able to use RPE daily in rehab to express subjective intensity level;Long Term:  Able to use RPE to guide intensity level when exercising independently Short Term: Able to use RPE daily in rehab to express subjective intensity level;Long Term:  Able to use RPE to guide intensity level when exercising independently     Able to understand and use Dyspnea scale Yes Yes Yes     Intervention Provide education and explanation on how to use Dyspnea scale Provide education and explanation on how to use Dyspnea scale Provide education and explanation on how to use Dyspnea scale     Expected Outcomes Short Term: Able to use Dyspnea scale daily in rehab to express subjective sense of shortness of breath during exertion;Long Term: Able to use Dyspnea scale to guide intensity level when exercising independently Short Term: Able to use Dyspnea scale daily in rehab to express subjective sense of shortness of breath during exertion;Long Term: Able to use Dyspnea scale to guide intensity level when exercising independently Short Term: Able to use Dyspnea scale daily in rehab to express subjective sense of shortness of breath during exertion;Long Term: Able to use Dyspnea scale to guide intensity level when exercising independently     Knowledge and understanding of Target Heart Rate Range (THRR) Yes Yes Yes     Intervention Provide education and explanation of THRR including how the numbers were predicted and where they are located for reference Provide education and explanation of THRR including how the numbers were predicted and  where they are located for reference Provide education and explanation of THRR including how the numbers were predicted and where they are located for reference     Expected Outcomes Short Term: Able to state/look up THRR;Long Term: Able to use THRR to govern intensity when exercising independently;Short Term: Able to use daily as guideline for intensity in rehab Short Term: Able to state/look up THRR;Long Term: Able to use THRR to govern intensity when exercising independently;Short Term: Able to use daily as guideline for intensity in rehab Short Term: Able to state/look up THRR;Long Term: Able to use THRR to govern intensity when exercising independently;Short Term: Able to use daily as guideline for intensity in rehab     Understanding of Exercise Prescription Yes Yes Yes     Intervention Provide education, explanation, and written materials on patient's individual exercise prescription Provide education, explanation, and written materials on patient's individual exercise prescription Provide education, explanation, and written materials on patient's individual exercise prescription     Expected Outcomes Short Term: Able to explain program exercise prescription;Long Term: Able to explain home exercise prescription to exercise independently Short Term: Able to explain program exercise prescription;Long Term: Able to explain home exercise prescription to exercise independently Short Term: Able to explain program exercise prescription;Long Term: Able to explain home exercise prescription to exercise independently              Exercise Goals Re-Evaluation :  Exercise Goals Re-Evaluation     Row Name 08/14/22 0752 09/04/22 1145           Exercise Goal Re-Evaluation   Exercise Goals Review Increase Physical Activity;Increase Strength and Stamina;Able to understand and use rate of perceived exertion (RPE) scale;Able to understand and use Dyspnea scale;Knowledge and understanding of Target Heart  Rate Range (THRR);Understanding of Exercise Prescription Increase Physical Activity;Increase Strength and Stamina;Able to understand and use rate of perceived exertion (RPE) scale;Able to understand and use Dyspnea scale;Knowledge and understanding of Target Heart Rate Range (THRR);Understanding of Exercise Prescription      Comments Pt is scheduled to begin exercise this week. [name] has completed 5 exercise sessions. She exercises on the Nustep and track for 15 min. [name] averages 1.7 METs at level 2 on  the Nustep and 2.04 METs on the track. She performs the warmup and cooldown standing/seated holding onto a chair dependent on her shortness of breath. [name] is very deconditioned making it difficult to progress her. She is very motivated to exercise and increase her functional capacity. Will continue to monitor and progress as able.      Expected Outcomes Through exercise at rehab and home, the patient will decrease shortness of breath with daily activities and feel confident in carrying out an exercise regimen. Through exercise at rehab and home, the patient will decrease shortness of breath with daily activities and feel confident in carrying out an exercise regimen.               Discharge Exercise Prescription (Final Exercise Prescription Changes):  Exercise Prescription Changes - 09/05/22 1200       Response to Exercise   Blood Pressure (Admit) 108/60    Blood Pressure (Exercise) 134/78    Blood Pressure (Exit) 102/66    Heart Rate (Admit) 77 bpm    Heart Rate (Exercise) 82 bpm    Heart Rate (Exit) 76 bpm    Oxygen Saturation (Admit) 94 %    Oxygen Saturation (Exercise) 90 %    Oxygen Saturation (Exit) 94 %    Rating of Perceived Exertion (Exercise) 13    Perceived Dyspnea (Exercise) 1    Duration Progress to 30 minutes of  aerobic without signs/symptoms of physical distress    Intensity THRR unchanged      Progression   Progression Continue to progress workloads to maintain  intensity without signs/symptoms of physical distress.      Resistance Training   Training Prescription Yes    Weight Red bands    Reps 10-15    Time 10 Minutes      Oxygen   Oxygen Continuous    Liters 3-4      NuStep   Level 2    SPM 60    Minutes 15    METs 1.7      Track   Laps 8    Minutes 15             Nutrition:  Target Goals: Understanding of nutrition guidelines, daily intake of sodium '1500mg'$ , cholesterol '200mg'$ , calories 30% from fat and 7% or less from saturated fats, daily to have 5 or more servings of fruits and vegetables.  Biometrics:  Pre Biometrics - 08/11/22 1615       Pre Biometrics   Grip Strength 24 kg              Nutrition Therapy Plan and Nutrition Goals:  Nutrition Therapy & Goals - 09/07/22 1141       Nutrition Therapy   Diet Heart Healthy/Carbohydrate Consistent diet    Drug/Food Interactions Statins/Certain Fruits      Personal Nutrition Goals   Nutrition Goal Patient to use the plate method as a guide for daily meal planning to include lean protein/plant protein, fruits, vegetables, whole grains, and nonfat/lowfat dairy as part of heart healthy lifestyle    Personal Goal #2 Patient to understand strategies for weight gain/weight maintenance of 0.5-2.0# per week    Personal Goal #3 Patient to limit sodium intake to '1500mg'$  daily    Personal Goal #4 Patient to identify food sources and limit daily intake of saturated fat, trans fat, sodium, and refined carbohydrates    Comments [name]'s weight is down 1.1# over the last 4 weeks. [name] reports increasing eating frequency to 4  meals per day up from 3 regular meals per day. Discussed increasing eating frequency to five meals per day and/or adding in nutrition supplements such as Ensure Clear twice daily (360kcals, 16 protein). She reports good appetite. Encouraged patient to follow-up with primary care regarding referral or home health aid/social work to aid with ADLs. Patient  reports continued support from her grandson.      Intervention Plan   Intervention Prescribe, educate and counsel regarding individualized specific dietary modifications aiming towards targeted core components such as weight, hypertension, lipid management, diabetes, heart failure and other comorbidities.;Nutrition handout(s) given to patient.    Expected Outcomes Short Term Goal: Understand basic principles of dietary content, such as calories, fat, sodium, cholesterol and nutrients.;Long Term Goal: Adherence to prescribed nutrition plan.             Nutrition Assessments:  Nutrition Assessments - 08/17/22 1053       Rate Your Plate Scores   Pre Score 48            MEDIFICTS Score Key: ?70 Need to make dietary changes  40-70 Heart Healthy Diet ? 40 Therapeutic Level Cholesterol Diet  Flowsheet Row PULMONARY REHAB OTHER RESPIRATORY from 08/17/2022 in [facility]  Picture Your Plate Total Score on Admission 48      Picture Your Plate Scores: <56 Unhealthy dietary pattern with much room for improvement. 41-50 Dietary pattern unlikely to meet recommendations for good health and room for improvement. 51-60 More healthful dietary pattern, with some room for improvement.  >60 Healthy dietary pattern, although there may be some specific behaviors that could be improved.    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 She also has hx of CAD s/p CABG, COPD. Most recent labs show A1c 6.4, BUN 23, CR 1.61, GRF 30 Cr 1.4, BUN 21, GFR 38; [facility] transplant team continues to monitor renal labs.      Expected Outcome [name] reports typical weight of 160-165#; she reports unwanted weight loss over the last >4 years. Per documentation, she was  153# on 08/01/2021 (down 9.8# in 12 months). She does lives alone and does her own grocery shopping and cooking. She reports eating a wide variety of foods and does eat three meals per day.  She is mindful of intake of refined carbohydrates and carbohydrate servings to support adequate blood sugar control. She does report variable appetite. She has good support from her adult grandson who visit her home daily and calls multiple times per day. Discussed increasing eating frequency/ implementing 1-2 snacks per day to support nutrition needs and weight maintenance. [name]'s weight is down 1.1# over the last 4 weeks. [name] reports increasing eating frequency to 4 meals per day up from 3 regular meals per day. Discussed increasing eating frequency to five meals per day and/or adding in nutrition supplements such as Ensure Clear twice daily (360kcals, 16 protein). She reports good appetite. Encouraged patient to follow-up with primary care regarding referral or home health aid/social work to aid with ADLs. Patient reports continued support from her grandson.               Nutrition Goals Discharge (Final Nutrition Goals Re-Evaluation):  Nutrition Goals Re-Evaluation - 09/07/22 1141       Goals   Current Weight 140 lb 3.4 oz (63.6 kg)    Comment Cr 1.4, BUN 21, GFR 38; [facility] transplant team continues to monitor renal labs.    Expected Outcome Aerielle's weight is down 1.1# over the last 4 weeks. Barrie reports increasing eating frequency to 4 meals per day up from 3 regular meals per day. Discussed increasing eating frequency to five meals per day and/or adding in nutrition supplements such as Ensure Clear twice daily (360kcals, 16 protein). She reports good appetite. Encouraged patient to follow-up with primary care regarding referral or home health aid/social work to aid with ADLs. Patient reports continued support from her grandson.             Psychosocial: Target Goals: Acknowledge presence or  absence of significant depression and/or stress, maximize coping skills, provide positive support system. Participant is able to verbalize types and ability to use techniques and skills needed for reducing stress and depression.  Initial Review & Psychosocial Screening:  Initial Psych Review & Screening - 08/11/22 1428       Initial Review   Current issues with None Identified      Family Dynamics   Good Support System? No    Comments has four children who live in [place] that do not help or concerned about her health.  Yolanda Bonine helps with errands.       Barriers   Psychosocial barriers to participate in program The patient should benefit from training in stress management and relaxation.      Screening Interventions   Interventions Encouraged to exercise    Expected Outcomes Short Term goal: Identification and review with participant of any Quality of Life or Depression concerns found by scoring the questionnaire.;Long Term goal: The participant improves quality of Life and PHQ9 Scores as seen by post scores and/or verbalization of changes             Quality of Life Scores:  Scores of 19 and below usually indicate a poorer quality of life in these areas.  A difference of  2-3 points is a clinically meaningful difference.  A difference of 2-3 points in the total score of the Quality of Life Index has been associated with significant improvement in overall quality of life, self-image, physical symptoms, and general health in studies assessing change in quality of life.  PHQ-9: Review Flowsheet       08/11/2022 09/26/2018 07/01/2018  Depression screen PHQ 2/9  Decreased Interest 0 0 0  Down, Depressed, Hopeless 2 1 0  PHQ - 2 Score 2 1 0  Altered sleeping 0 0 0  Tired, decreased energy $RemoveBeforeDE'2 1 2  'zfsCeZMidqjTKXr$ Change in appetite 1 0 0  Feeling bad or failure about yourself  0 1 0  Trouble concentrating 1 0 0  Moving slowly or fidgety/restless 1 0 0  Suicidal thoughts 0 - -  PHQ-9 Score $RemoveBef'7 3  2  'PwRYfxeKCX$ Difficult doing work/chores Not difficult at all Not difficult at all Not difficult at all   Interpretation of Total Score  Total Score Depression Severity:  1-4 = Minimal depression, 5-9 = Mild depression, 10-14 = Moderate depression, 15-19 = Moderately severe depression, 20-27 = Severe depression   Psychosocial Evaluation and Intervention:  Psychosocial Evaluation - 08/16/22 0818       Psychosocial Evaluation & Interventions   Interventions Encouraged to exercise with the program and follow exercise prescription    Comments Pt  is scheduled to exercise this week. Macrina does not seem to have pyschosocial barriers.    Expected Outcomes For [name] to attend rehab free of psychosocial concerns.    Continue Psychosocial Services  No Follow up required             Psychosocial Re-Evaluation:  Psychosocial Re-Evaluation     [name] Name 08/16/22 0824 09/07/22 1626           Psychosocial Re-Evaluation   Current issues with None Identified None Identified      Comments no identifiable barriers [name] feels like she is doing well in rehab. She is currently working towards getting a home health aid to help her with daily activities. Sharay is excited to recieve some help with her chores as they fatigue her easily.      Expected Outcomes For pt to attend rehab free of psychosocial concerns. For pt to attend rehab free of psychosocial concerns.      Interventions Encouraged to attend Pulmonary Rehabilitation for the exercise Encouraged to attend Pulmonary Rehabilitation for the exercise      Continue Psychosocial Services  No Follow up required No Follow up required               Psychosocial Discharge (Final Psychosocial Re-Evaluation):  Psychosocial Re-Evaluation - 09/07/22 1626       Psychosocial Re-Evaluation   Current issues with None Identified    Comments [name] feels like she is doing well in rehab. She is currently working towards getting a home health aid to help her  with daily activities. [name] is excited to recieve some help with her chores as they fatigue her easily.    Expected Outcomes For pt to attend rehab free of psychosocial concerns.    Interventions Encouraged to attend Pulmonary Rehabilitation for the exercise    Continue Psychosocial Services  No Follow up required             Education: Education Goals: Education classes will be provided on a weekly basis, covering required topics. Participant will state understanding/return demonstration of topics presented.  Learning Barriers/Preferences:  Learning Barriers/Preferences - 08/11/22 1429       Learning Barriers/Preferences   Learning Barriers Sight    Learning Preferences Written Material;Verbal Instruction;Group Instruction;Skilled Demonstration             Education Topics: Introduction to Pulmonary Rehab Group instruction provided by PowerPoint, verbal discussion, and written material to support subject matter. Instructor reviews what Pulmonary Rehab is, the purpose of the program, and how patients are referred.     Know Your Numbers Group instruction that is supported by a PowerPoint presentation. Instructor discusses importance of knowing and understanding resting, exercise, and post-exercise oxygen saturation, heart rate, and blood pressure. Oxygen saturation, heart rate, blood pressure, rating of perceived exertion, and dyspnea are reviewed along with a normal range for these values.  Flowsheet Row PULMONARY REHAB OTHER RESPIRATORY from 09/07/2022 in [place]  Date 09/07/22  Educator EP  Instruction Review Code 1- Verbalizes Understanding       Exercise for the Pulmonary Patient Group instruction that is supported by a PowerPoint presentation. Instructor discusses benefits of exercise, core components of exercise, frequency, duration, and intensity of an exercise routine, importance of utilizing pulse oximetry during exercise,  safety while exercising, and options of places to exercise outside of rehab.  Flowsheet Row PULMONARY REHAB OTHER RESPIRATORY from 09/26/2018 in [place]  Date 09/05/18  Educator  EP  Instruction Review Code 1- Verbalizes Understanding          MET Level  Group instruction provided by PowerPoint, verbal discussion, and written material to support subject matter. Instructor reviews what METs are and how to increase METs.    Pulmonary Medications Verbally interactive group education provided by instructor with focus on inhaled medications and proper administration. Flowsheet Row PULMONARY REHAB OTHER RESPIRATORY from 09/26/2018 in [place]  Date 07/30/18  Instruction Review Code 1- Verbalizes Understanding       Anatomy and Physiology of the Respiratory System Group instruction provided by PowerPoint, verbal discussion, and written material to support subject matter. Instructor reviews respiratory cycle and anatomical components of the respiratory system and their functions. Instructor also reviews differences in obstructive and restrictive respiratory diseases with examples of each.  Flowsheet Row PULMONARY REHAB OTHER RESPIRATORY from 09/07/2022 in [place]  Date 08/31/22  Educator EP  Instruction Review Code 1- Verbalizes Understanding       Oxygen Safety Group instruction provided by PowerPoint, verbal discussion, and written material to support subject matter. There is an overview of "What is Oxygen" and "Why do we need it".  Instructor also reviews how to create a safe environment for oxygen use, the importance of using oxygen as prescribed, and the risks of noncompliance. There is a brief discussion on traveling with oxygen and resources the patient may utilize. Flowsheet Row PULMONARY REHAB OTHER RESPIRATORY from 09/26/2018 in [place]  Date  08/22/18  Educator molly  Instruction Review Code 1- Verbalizes Understanding       Oxygen Use Group instruction provided by PowerPoint, verbal discussion, and written material to discuss how supplemental oxygen is prescribed and different types of oxygen supply systems. Resources for more information are provided.    Breathing Techniques Group instruction that is supported by demonstration and informational handouts. Instructor discusses the benefits of pursed lip and diaphragmatic breathing and detailed demonstration on how to perform both.     Risk Factor Reduction Group instruction that is supported by a PowerPoint presentation. Instructor discusses the definition of a risk factor, different risk factors for pulmonary disease, and how the heart and lungs work together. Flowsheet Row PULMONARY REHAB OTHER RESPIRATORY from 09/07/2022 in [place]  Date 08/17/22  Educator EP  Instruction Review Code 1- Verbalizes Understanding       MD Day A group question and answer session with a medical doctor that allows participants to ask questions that relate to their pulmonary disease state. Flowsheet Row PULMONARY REHAB OTHER RESPIRATORY from 09/26/2018 in [place]  Date 09/12/18  Educator yacoub  Instruction Review Code 1- Verbalizes Understanding       Nutrition for the Pulmonary Patient Group instruction provided by PowerPoint slides, verbal discussion, and written materials to support subject matter. The instructor gives an explanation and review of healthy diet recommendations, which includes a discussion on weight management, recommendations for fruit and vegetable consumption, as well as protein, fluid, caffeine, fiber, sodium, sugar, and alcohol. Tips for eating when patients are short of breath are discussed. Flowsheet Row PULMONARY REHAB OTHER RESPIRATORY from 09/26/2018 in [place]  Date 07/18/18  Educator Rodman Pickle  Instruction Review Code 2- Demonstrated Understanding        Other Education Group or individual verbal, written, or video instructions that support the educational goals of the pulmonary  rehab program.    Knowledge Questionnaire Score:  Knowledge Questionnaire Score - 08/11/22 1615       Knowledge Questionnaire Score   Pre Score 10/18             Core Components/Risk Factors/Patient Goals at Admission:  Personal Goals and Risk Factors at Admission - 08/11/22 1430       Core Components/Risk Factors/Patient Goals on Admission   Improve shortness of breath with ADL's Yes    Intervention Provide education, individualized exercise plan and daily activity instruction to help decrease symptoms of SOB with activities of daily living.    Expected Outcomes Short Term: Improve cardiorespiratory fitness to achieve a reduction of symptoms when performing ADLs;Long Term: Be able to perform more ADLs without symptoms or delay the onset of symptoms    Hypertension Yes    Intervention Provide education on lifestyle modifcations including regular physical activity/exercise, weight management, moderate sodium restriction and increased consumption of fresh fruit, vegetables, and low fat dairy, alcohol moderation, and smoking cessation.;Monitor prescription use compliance.    Expected Outcomes Short Term: Continued assessment and intervention until BP is < 140/40mm HG in hypertensive participants. < 130/41mm HG in hypertensive participants with diabetes, heart failure or chronic kidney disease.;Long Term: Maintenance of blood pressure at goal levels.             Core Components/Risk Factors/Patient Goals Review:   Goals and Risk Factor Review     Row Name 08/16/22 0825 09/07/22 1628           Core Components/Risk Factors/Patient Goals Review   Personal Goals Review Hypertension;Develop more efficient breathing techniques such as purse lipped  breathing and diaphragmatic breathing and practicing self-pacing with activity.;Improve shortness of breath with ADL's Hypertension;Develop more efficient breathing techniques such as purse lipped breathing and diaphragmatic breathing and practicing self-pacing with activity.;Improve shortness of breath with ADL's      Review Pt is scheduled to begin exercise this week. Will continue to assess. Keelia has completed 7 exercise sessions. She feels that her functional capacity has improved since starting rehab. She is very motivated to exercise. Her METs on the Nustep have remained relatively the same although her track laps have increased. Her blood pressure has decreased since starting. Her last blood pressure was 114/60.      Expected Outcomes See admission goals. See admission goals.               Core Components/Risk Factors/Patient Goals at Discharge (Final Review):   Goals and Risk Factor Review - 09/07/22 1628       Core Components/Risk Factors/Patient Goals Review   Personal Goals Review Hypertension;Develop more efficient breathing techniques such as purse lipped breathing and diaphragmatic breathing and practicing self-pacing with activity.;Improve shortness of breath with ADL's    Review [name] has completed 7 exercise sessions. She feels that her functional capacity has improved since starting rehab. She is very motivated to exercise. Her METs on the Nustep have remained relatively the same although her track laps have increased. Her blood pressure has decreased since starting. Her last blood pressure was 114/60.    Expected Outcomes See admission goals.             ITP Comments: ITP REVIEW Pt is making expected progress toward pulmonary rehab goals after completing 8 sessions. Recommend continued exercise, life style modification, education, and utilization of breathing techniques to increase stamina and strength and decrease shortness of breath with exertion.   Comments: Dr.  Rodman Pickle  is Market researcher for Pulmonary Rehab at [facility].

## 2022-09-14 ENCOUNTER — Encounter (HOSPITAL_COMMUNITY)
Admission: RE | Admit: 2022-09-14 | Discharge: 2022-09-14 | Disposition: A | Discharge status: Home / Self Care | Payer: Medicare Other | Source: Ambulatory Visit | Attending: Pulmonary Disease | Admitting: Pulmonary Disease

## 2022-09-14 ENCOUNTER — Other Ambulatory Visit: Payer: Self-pay | Admitting: Cardiovascular Disease

## 2022-09-14 VITALS — Wt 138.9 lb

## 2022-09-14 DIAGNOSIS — J4550 Severe persistent asthma, uncomplicated: Secondary | ICD-10-CM | POA: Diagnosis not present

## 2022-09-14 NOTE — Progress Notes (Signed)
Daily Session Note  Patient Details  Name: CODI FOLKERTS MRN: [id number] Date of Birth: 01-08-1941 Referring Provider:   [name] Pulmonary Rehab Walk Test from 08/11/2022 in [place]  Referring Provider [name]       Encounter Date: 09/14/2022  Check In:  Session Check In - 09/14/22 1119       Check-In   Supervising physician immediately available to respond to emergencies [facility] - Physician supervision    Physician(s) Ander Slade    Location [facility]    Staff Present Elmon Else, MS, ACSM-CEP, Exercise Physiologist;[name], MS, ACSM-CEP, CCRP, Exercise Physiologist;Samantha [place], RD, LDN;Lisa Ysidro Evert, RN;Carlette Tilden, RN, BSN;  BS, ACSM-CEP, Exercise Physiologist;Jetta Walker BS, ACSM-CEP, Exercise Physiologist    Virtual Visit No    Medication changes reported     No    Fall or balance concerns reported    No    Tobacco Cessation No Change    Warm-up and Cool-down Performed as group-led Higher education careers adviser Performed Yes    VAD Patient? No    PAD/SET Patient? No      Pain Assessment   Currently in Pain? No/denies             Capillary Blood Glucose: No results found for this or any previous visit (from the past 24 hour(s)).   Exercise Prescription Changes - 09/14/22 1200       Response to Exercise   Blood Pressure (Admit) 142/60    Blood Pressure (Exercise) 142/70    Blood Pressure (Exit) 122/64    Heart Rate (Admit) 63 bpm    Heart Rate (Exercise) 70 bpm    Heart Rate (Exit) 79 bpm    Oxygen Saturation (Admit) 85 %    Oxygen Saturation (Exercise) 85 %    Oxygen Saturation (Exit) 92 %    Rating of Perceived Exertion (Exercise) 11    Perceived Dyspnea (Exercise) 3    Duration Continue with 30 min of aerobic exercise without signs/symptoms of physical distress.    Intensity THRR unchanged      Progression   Progression Continue to progress workloads to maintain  intensity without signs/symptoms of physical distress.      Resistance Training   Training Prescription Yes    Weight Red bands    Reps 10-15    Time 10 Minutes      Interval Training   Interval Training No      Oxygen   Oxygen Continuous    Liters 3-4      NuStep   Level 3    SPM 60    Minutes 15    METs 1.6      Track   Laps 8    Minutes 15    METs 1.93             Social History   Tobacco Use  Smoking Status Former   Packs/day: 1.00   Years: 8.00   Total pack years: 8.00   Types: Cigarettes   Quit date: 11/21/1983   Years since quitting: 38.8  Smokeless Tobacco Never    Goals Met:  Independence with exercise equipment Using PLB without cueing & demonstrates good technique Exercise tolerated well No report of concerns or symptoms today Strength training completed today  Goals Unmet:  Not Applicable  Comments: Service time is from 1001 to 1135.    Dr. Rodman Pickle is Medical Director for Pulmonary Rehab at [facility].

## 2022-09-19 ENCOUNTER — Encounter: Payer: Self-pay | Admitting: Cardiovascular Disease

## 2022-09-19 ENCOUNTER — Encounter (HOSPITAL_COMMUNITY): Payer: Medicare Other

## 2022-09-21 ENCOUNTER — Encounter (HOSPITAL_COMMUNITY)
Admission: RE | Admit: 2022-09-21 | Discharge: 2022-09-21 | Disposition: A | Discharge status: Home / Self Care | Payer: Medicare Other | Source: Ambulatory Visit | Attending: Pulmonary Disease | Admitting: Pulmonary Disease

## 2022-09-21 DIAGNOSIS — I25119 Atherosclerotic heart disease of native coronary artery with unspecified angina pectoris: Secondary | ICD-10-CM | POA: Diagnosis present

## 2022-09-21 DIAGNOSIS — Z951 Presence of aortocoronary bypass graft: Secondary | ICD-10-CM | POA: Diagnosis present

## 2022-09-21 DIAGNOSIS — J4550 Severe persistent asthma, uncomplicated: Secondary | ICD-10-CM | POA: Insufficient documentation

## 2022-09-21 NOTE — Progress Notes (Signed)
Daily Session Note  Patient Details  Name: [name] MRN: [id number] Date of Birth: 16-Oct-1941 Referring Provider:   [name] Pulmonary Rehab Walk Test from 08/11/2022 in [place]  Referring Provider [name]       Encounter Date: 09/21/2022  Check In:  Session Check In - 09/21/22 1207       Check-In   Supervising physician immediately available to respond to emergencies [facility] - Physician supervision    Physician(s) Dr. Erin Fulling    Location [facility]    Staff Present Maurice Small, RN, BSN;Lisa Ysidro Evert, Cathleen Fears, MS, ACSM-CEP, Exercise Physiologist;Bailey Pearline Cables, MS, Exercise Physiologist;[name], MS, ACSM-CEP, CCRP, Exercise Physiologist    Virtual Visit No    Medication changes reported     No    Fall or balance concerns reported    No    Tobacco Cessation No Change    Warm-up and Cool-down Performed as group-led instruction    Resistance Training Performed Yes    VAD Patient? No    PAD/SET Patient? No      Pain Assessment   Currently in Pain? No/denies    Multiple Pain Sites No             Capillary Blood Glucose: No results found for this or any previous visit (from the past 24 hour(s)).    Social History   Tobacco Use  Smoking Status Former   Packs/day: 1.00   Years: 8.00   Total pack years: 8.00   Types: Cigarettes   Quit date: 11/21/1983   Years since quitting: 38.8  Smokeless Tobacco Never    Goals Met:  Proper associated with RPD/PD & O2 Sat Exercise tolerated well No report of concerns or symptoms today Strength training completed today  Goals Unmet:  Not Applicable  Comments: Service time is from 1010 to 1144.    Dr. Rodman Pickle is Medical Director for Pulmonary Rehab at [facility].

## 2022-09-25 ENCOUNTER — Other Ambulatory Visit: Payer: Self-pay | Admitting: Cardiovascular Disease

## 2022-09-26 ENCOUNTER — Encounter (HOSPITAL_COMMUNITY)
Admission: RE | Admit: 2022-09-26 | Discharge: 2022-09-26 | Disposition: A | Discharge status: Home / Self Care | Payer: Medicare Other | Source: Ambulatory Visit | Attending: Pulmonary Disease | Admitting: Pulmonary Disease

## 2022-09-26 VITALS — Wt 138.0 lb

## 2022-09-26 DIAGNOSIS — J4550 Severe persistent asthma, uncomplicated: Secondary | ICD-10-CM

## 2022-09-26 NOTE — Progress Notes (Signed)
Daily Session Note  Patient Details  Name: [name] MRN: [id number] Date of Birth: 1941-10-12 Referring Provider:   [name] Pulmonary Rehab Walk Test from 08/11/2022 in [place]  Referring Provider [name]       Encounter Date: 09/26/2022  Check In:  Session Check In - 09/26/22 1149       Check-In   Supervising physician immediately available to respond to emergencies [facility] - Physician supervision    Physician(s) Dr. Erskine Emery    Location [facility]    Staff Present [name], Cathleen Fears, MS, ACSM-CEP, Exercise Physiologist;[name], MS, ACSM-CEP, CCRP, Exercise Physiologist    Virtual Visit No    Medication changes reported     No    Fall or balance concerns reported    No    Tobacco Cessation No Change    Warm-up and Cool-down Performed as group-led instruction    Resistance Training Performed Yes    VAD Patient? No    PAD/SET Patient? No      Pain Assessment   Currently in Pain? No/denies    Multiple Pain Sites No             Capillary Blood Glucose: No results found for this or any previous visit (from the past 24 hour(s)).   Exercise Prescription Changes - 09/26/22 1200       Response to Exercise   Blood Pressure (Admit) 114/58    Blood Pressure (Exercise) 140/70    Blood Pressure (Exit) 106/56    Heart Rate (Admit) 72 bpm    Heart Rate (Exercise) 85 bpm    Heart Rate (Exit) 67 bpm    Oxygen Saturation (Admit) 94 %    Oxygen Saturation (Exercise) 89 %    Oxygen Saturation (Exit) 97 %    Rating of Perceived Exertion (Exercise) 11    Perceived Dyspnea (Exercise) 3    Duration Continue with 30 min of aerobic exercise without signs/symptoms of physical distress.    Intensity THRR unchanged      Progression   Progression Continue to progress workloads to maintain intensity without signs/symptoms of physical distress.      Resistance Training   Training Prescription Yes     Weight Red Bands    Reps 10-15    Time 10 Minutes      Oxygen   Oxygen Continuous    Liters 3-4      NuStep   Level 4    SPM 60    Minutes 15    METs 1.7      Track   Laps 12    Minutes 15             Social History   Tobacco Use  Smoking Status Former   Packs/day: 1.00   Years: 8.00   Total pack years: 8.00   Types: Cigarettes   Quit date: 11/21/1983   Years since quitting: 38.8  Smokeless Tobacco Never    Goals Met:  Proper associated with RPD/PD & O2 Sat Exercise tolerated well No report of concerns or symptoms today Strength training completed today  Goals Unmet:  Not Applicable  Comments: Service time is from 1011 to [place]    Dr. Rodman Pickle is Medical Director for Pulmonary Rehab at [facility].

## 2022-09-28 ENCOUNTER — Encounter (HOSPITAL_COMMUNITY)
Admission: RE | Admit: 2022-09-28 | Discharge: 2022-09-28 | Disposition: A | Discharge status: Home / Self Care | Payer: Medicare Other | Source: Ambulatory Visit | Attending: Pulmonary Disease | Admitting: Pulmonary Disease

## 2022-09-28 DIAGNOSIS — J4550 Severe persistent asthma, uncomplicated: Secondary | ICD-10-CM

## 2022-09-28 NOTE — Progress Notes (Signed)
Daily Session Note  Patient Details  Name: [name] MRN: [id number] Date of Birth: 1941/09/15 Referring Provider:   [name] Pulmonary Rehab Walk Test from 08/11/2022 in [facility]  Referring Provider [name]       Encounter Date: 09/28/2022  Check In:  Session Check In - 09/28/22 1205       Check-In   Supervising physician immediately available to respond to emergencies [facility] - Physician supervision    Physician(s) Dr. Erskine Emery    Location [facility]    Staff Present [name], Cathleen Fears, MS, ACSM-CEP, Exercise Physiologist    Virtual Visit No    Medication changes reported     No    Fall or balance concerns reported    No    Tobacco Cessation No Change    Warm-up and Cool-down Performed as group-led instruction    Resistance Training Performed Yes    VAD Patient? No    PAD/SET Patient? No      Pain Assessment   Currently in Pain? No/denies    Multiple Pain Sites No             Capillary Blood Glucose: No results found for this or any previous visit (from the past 24 hour(s)).    Social History   Tobacco Use  Smoking Status Former   Packs/day: 1.00   Years: 8.00   Total pack years: 8.00   Types: Cigarettes   Quit date: 11/21/1983   Years since quitting: 38.8  Smokeless Tobacco Never    Goals Met:  Proper associated with RPD/PD & O2 Sat Exercise tolerated well No report of concerns or symptoms today Strength training completed today  Goals Unmet:  Not Applicable  Comments: Service time is from 1018 to 1138   Dr. Rodman Pickle is Medical Director for Pulmonary Rehab at [facility].

## 2022-10-02 ENCOUNTER — Encounter: Payer: Self-pay | Admitting: Cardiovascular Disease

## 2022-10-02 NOTE — Progress Notes (Unsigned)
Cardiology Office Note   Date:  10/03/2022   ID:  [name], DOB 23-Nov-1940, MRN [id number]  PCP:  Chesley Noon, MD  Cardiologist:  Mertie Moores, MD   Chief Complaint  Patient presents with   Coronary Artery Disease        Hypertension        Problem list 1. CAD - CABG , 2001 2. ESRD - s/p renal transplant - Sept. 2012  3. GI bleed:  4. DM    [name] is a 81 y.o. female who presents  Today to follow-up shortness of breath. I saw her last April, 2015. Before that she had had some difficulties with shortness of breath. We thought that some of her symptoms were related to increased levels of Prograf.  Fortunately she is doing well. She is not having any chest pain or significant shortness of breath.  March 07, 2016:  Doing well.  Previous patient of Dr. Ron Parker. No issues.   Some DOE if she walks too far.  No Cp , has not had to take any NTG .  March 14, 2017:  Has had some dyspnea with exertion,  Seems to have worsened over the past month or so Has to stop and rest twice when walking to the mailbox   Has had some allergy issues Stays fatigued.   March 04, 2018:  [name] is seen back today for follow-up of her coronary artery disease. Been started on home oxygen.  She has history of chronic asthma end-stage renal disease, status post renal transplant Was in the hospital in March - was not D/C'd on home O2.  Has developed worsening dyspnea . O2 sats were in the 80s  No CP  O2 sat was 81% when she arrived in the office today  Came up to 90% with O2  2 liters / min   March 04, 2020:  [name] is seen back today for follow-up of her coronary artery disease.  She has a history of coronary artery bypass grafting in 2001.  She has end-stage renal disease and is s/p renal transplant about 9 years.   Avoids salt ,  Fried foods  She had lipids drawn at Dr. Fayrene Fearing office in February.  Her total cholesterol is 140.  The triglyceride level is 142.   The HDL is 54.  The LDL is 62.  Complete metabolic profile drawn at that time reveals a creatinine of 1.57.  The sodium is 141.  The potassium is 4.6.  Liver enzymes look normal.   Sept. 12, 2022  [name] is seen today for follow up of CAD, CABG, ESRD, HLD BP is a bit elevated - has not taken her meds Coughs with swallowing fairly frequently  Concerning for aspiration .  Avoids salt and salty foods    Nov. 14, 2023 [name] is seen for follow up of her CAD, CABG, ESRD, HLD  Has severe astham Going to pulmonary rehab   No CP ,  Has indigestion like disfomfort ,  very similar to her presenting angina   Will get a lexiscan myoview  Heart burn is very frequent ,  is not worsened by exercise .  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, hold ASA 2-3 weeks - consider plavix instead of ASA   Asthma  Cellulitis 12/19/2019   right upper arm   Coronary artery disease    cath 11/2007, grafts patent /  Nuclear, June, 2011, prior inferior MI with mild peri-infarct ischemia, anterior breast attenuation, EF 67%, done for renal transplant assessment / Low level exercise/Lexiscan Myoview (11/2013): EF 67%, no ischemi; normal study   Diabetes mellitus    type 2   ESRD on dialysis [facility])    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, 2012   Family history of breast cancer    Family history of prostate cancer    GERD (gastroesophageal reflux disease)    GI bleed 11/26/2010   January, 2012 , AVM   Gout    History of methicillin resistant staphylococcus aureus (MRSA)    Hyperlipidemia    Hyperparathyroidism    Hypertension    Myocardial infarction [facility])    Osteoporosis    Pneumonia 08/2010    Hospitalization, October, 2011   Primary osteoarthritis, 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   S/P kidney transplant    September, 2012, [facility]   Subacromial impingement of 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     Past Surgical History:  Procedure Laterality Date   ABDOMINAL HYSTERECTOMY  1980'S   ARTERIOVENOUS GRAFT PLACEMENT Left 03/04/2007   forearm   BIOPSY  09/30/2019   Procedure: BIOPSY;  Surgeon: Wilford Corner, MD;  Location: [facility];  Service: Endoscopy;;   BIOPSY  12/27/2021   Procedure: BIOPSY;  Surgeon: Wilford Corner, MD;  Location: [facility];  Service: Endoscopy;;   BREAST EXCISIONAL BIOPSY Right    benign more than 10 yr ago   [place]  06/03/2002; 12/04/2007   COLONOSCOPY WITH PROPOFOL N/A 07/14/2014   Procedure: COLONOSCOPY WITH PROPOFOL;  Surgeon: Lear Ng, MD;  [facility]: [facility];  Service: Endoscopy;  Laterality: N/A;   COLONOSCOPY WITH PROPOFOL N/A 09/30/2019   Procedure: COLONOSCOPY WITH PROPOFOL;  Surgeon: Wilford Corner, MD;  [facility];  Service: Endoscopy;  Laterality: N/A;   COLONOSCOPY WITH PROPOFOL N/A 12/27/2021   Procedure: COLONOSCOPY WITH PROPOFOL;  Surgeon: Wilford Corner, MD;  [facility];  Service: Endoscopy;  Laterality: N/A;   CORONARY ARTERY BYPASS GRAFT  06/06/2002   x 4   ESOPHAGOGASTRODUODENOSCOPY N/A 12/27/2021   Procedure: ESOPHAGOGASTRODUODENOSCOPY (EGD);  Surgeon: Wilford Corner, MD;  Location: [facility];  Service: Endoscopy;  Laterality: N/A;   HEMOSTASIS CLIP PLACEMENT  12/27/2021   Procedure: HEMOSTASIS CLIP PLACEMENT;  Surgeon: Wilford Corner, MD;  [facility];  Service: Endoscopy;;   HOT HEMOSTASIS N/A 07/14/2014   Procedure: HOT HEMOSTASIS (ARGON PLASMA COAGULATION/BICAP);  Surgeon: Lear Ng, MD;  Location: [facility];  Service: Endoscopy;  Laterality: N/A;   IR FLUORO GUIDE CV LINE RIGHT  12/22/2019   IR US GUIDE VASC ACCESS RIGHT  12/22/2019   KIDNEY TRANSPLANT Right 08/04/2011   ORIF PATELLA Left 04/06/2016   Procedure: OPEN REDUCTION INTERNAL (ORIF) LEFT  PATELLA;  Surgeon: Ninetta Lights, MD;   Location: [place];  Service: Orthopedics;  Laterality: Left;   POLYPECTOMY  09/30/2019   Procedure: POLYPECTOMY;  Surgeon: Wilford Corner, MD;  [facility];  Service: Endoscopy;;   POLYPECTOMY  12/27/2021   Procedure: POLYPECTOMY;  Surgeon: Wilford Corner, MD;  [facility];  Service: Endoscopy;;   SHOULDER ARTHROSCOPY WITH ROTATOR CUFF REPAIR AND SUBACROMIAL DECOMPRESSION Left 08/03/2016   Procedure: LEFT SHOULDER ARTHROSCOPY WITH ROTATOR CUFF REPAIR AND SUBACROMIAL DECOMPRESSION with distal claviculectomy and extentsive debridement;  Surgeon:  Ninetta Lights, MD;  Location: [place];  Service: Orthopedics;  Laterality: Left;  Block   THROMBECTOMY AND REVISION OF ARTERIOVENTOUS (AV) GORETEX  GRAFT Left 05/08/2007   forearm   TOTAL HIP ARTHROPLASTY  12/18/2012   Procedure: TOTAL HIP ARTHROPLASTY;  Surgeon: Ninetta Lights, MD;  Location: [place];  Service: Orthopedics;  Laterality: Left;   VESICOVAGINAL FISTULA CLOSURE W/ TAH  1984    Patient Active Problem List   Diagnosis Date Noted   Coronary artery disease     Priority: High   History of renal transplantation 07/10/2022   Asymptomatic menopausal state 11/03/2021   Olecranon bursitis of right elbow 12/26/2019   Right arm cellulitis 12/18/2019   Fever    Hx of colonic polyps 09/30/2019   Family history of breast cancer    Family history of prostate cancer    Family history of lung cancer 08/08/2018   Shoulder pain, right 04/04/2018   Severe persistent asthma with (acute) exacerbation 02/13/2018   GERD (gastroesophageal reflux disease) 02/12/2018   Severe persistent asthma with acute exacerbation 02/12/2018   Complete rotator cuff tear of 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   Subacromial impingement of 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   Primary osteoarthritis, 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   History of methicillin resistant staphylococcus aureus (MRSA)    Benign neoplasm of colon 07/14/2014    Nausea & vomiting 12/22/2013   Dyspnea 12/22/2013   Diabetes ([place]) 02/28/2013   Preop cardiovascular exam    Type II or unspecified type diabetes mellitus without mention of complication, not stated as uncontrolled 06/05/2012   Encounter for long-term (current) use of other medications 02/26/2012   Type II or unspecified type diabetes mellitus with renal manifestations, not stated as uncontrolled(250.40) 02/26/2012   S/P kidney transplant    H/O steroid therapy    Anemia    Hyperlipidemia    Hypertension    Aspirin allergy    Hx of CABG    Ejection fraction    Anemia    GI bleed 11/26/2010   Pneumonia 08/20/2010   FOOT PAIN 01/20/2010   THROMBOCYTOPENIA 01/11/2009   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     albuterol (PROVENTIL) (2.5 MG/3ML) 0.083% nebulizer solution Take 2.5 mg by nebulization every 6 (six) hours as needed for wheezing or shortness of breath.     albuterol (VENTOLIN HFA) 108 (90 Base) MCG/ACT inhaler Inhale 1-2 puffs into the lungs every 6 (six) hours as needed for wheezing or shortness of breath.     amLODipine (NORVASC) 10 MG tablet Take 10 mg by mouth daily.      aspirin EC 81 MG tablet Take 81 mg by mouth daily.     Azelastine HCl 0.15 % SOLN Place 1 spray into both nostrils daily as needed for allergies.     budesonide (PULMICORT) 0.25 MG/2ML nebulizer solution Inhale 0.25 mg into the lungs as needed.     calcitRIOL (ROCALTROL) 0.25 MCG capsule Take 0.25 mcg by mouth daily.      ezetimibe (ZETIA) 10 MG tablet TAKE 1 TABLET BY MOUTH EVERY DAY 15 tablet 0   fluticasone (FLONASE) 50 MCG/ACT nasal spray Place into both nostrils.     fluticasone  furoate-vilanterol (  BREO ELLIPTA) 200-25 MCG/INH AEPB Inhale 1 puff into the lungs daily as needed (respiratory issues.).      glucose blood (ONETOUCH VERIO) test strip 1 each by Other route daily in the afternoon. Use as instructed 100 each 3   isosorbide mononitrate (IMDUR) 30 MG 24 hr tablet TAKE 1 TABLET BY MOUTH EVERY DAY 90 tablet 2   KLOR-CON M20 20 MEQ tablet TAKE 1 TABLET BY MOUTH EVERY DAY 90 tablet 2   metoprolol tartrate (LOPRESSOR) 50 MG tablet TAKE 1 TABLET BY MOUTH TWICE A DAY 180 tablet 0   omeprazole (PRILOSEC) 20 MG capsule Take 20 mg by mouth daily after breakfast.     predniSONE (DELTASONE) 5 MG tablet Take 5 mg by mouth 2 (two) times daily.     rosuvastatin (CRESTOR) 40 MG tablet Take 1 tablet (40 mg total) by mouth daily. Pt needs to make appt with provider for further refills - 2nd attempt 15 tablet 0   sennosides-docusate sodium (SENOKOT-S) 8.6-50 MG tablet Take 1 tablet by mouth daily as needed (constipation.).      tacrolimus (PROGRAF) 1 MG capsule Take 5 mg by mouth See admin instructions. Take 5 mg by mouth at 9 AM and 5 mg at 9 PM     Tiotropium Bromide Monohydrate 1.25 MCG/ACT AERS Inhale 1 puff into the lungs daily.     nitroGLYCERIN (NITROSTAT) 0.4 MG SL tablet Place 1 tablet (0.4 mg total) under the tongue every 5 (five) minutes as needed for chest pain. 25 tablet 1   No current facility-administered medications for this visit.    Allergies:   Sodium hypochlorite, Lactose, Asa arthritis strength-antacid [aspirin buffered], Aspirin, Atorvastatin, Banana, Lactose intolerance (gi), and Lisinopril    Social History:  The patient  reports that she quit smoking about 38 years ago. Her smoking use included cigarettes. She has a 8.00 pack-year smoking history. She has never used smokeless tobacco. She reports that she does not drink alcohol and does not use drugs.   Family History:  The patient's family history includes Breast cancer (age of onset: 21) in her sister;  Lung cancer in her brother; Prostate cancer in her brother.    ROS:  Please see the history of present illness.      Patient denies fever, chills, headache, sweats, rash, change in vision, change in hearing, chest pain, cough, nausea or vomiting, urinary symptoms. All other systems are reviewed and are negative.  Physical Exam: Blood pressure 136/68, pulse 77, height '5\' 2"'$  (1.575 m), weight 138 lb 12.8 oz (63 kg), last menstrual period 03/04/2022, SpO2 91 %.       GEN:  Well nourished, well developed in no acute distress HEENT: Normal NECK: No JVD; No carotid bruits LYMPHATICS: No lymphadenopathy CARDIAC: RRR , no murmurs, rubs, gallops RESPIRATORY:  Clear to auscultation without rales, wheezing or rhonchi  ABDOMEN: Soft, non-tender, non-distended MUSCULOSKELETAL:  No edema; No deformity  SKIN: Warm and dry NEUROLOGIC:  Alert and oriented x 3   EKG: October 03, 2022: Normal sinus rhythm.  Normal EKG.   Recent Labs: 10/17/2021: ALT 7 12/27/2021: Hemoglobin 13.6 07/10/2022: BUN 24; Creatinine, Ser 1.61; Potassium 3.5; Sodium 141; TSH 2.07    Lipid Panel    Component Value Date/Time   CHOL 138 10/17/2021 0847   TRIG 82 10/17/2021 0847   HDL 65 10/17/2021 0847   CHOLHDL 2.1 10/17/2021 0847   CHOLHDL 2.6 02/14/2018 0223   VLDL 20 02/14/2018 0223   LDLCALC 57 10/17/2021 0847  LDLDIRECT 136.3 02/26/2012 1715      Wt Readings from Last 3 Encounters:  10/03/22 138 lb 12.8 oz (63 kg)  09/26/22 138 lb 0.1 oz (62.6 kg)  09/14/22 138 lb 14.2 oz (63 kg)      Current medicines are reviewed   Patient understands her medications well.     ASSESSMENT AND PLAN:  1. CAD - CABG , 2001 -  He had complaints of indigestion-like chest discomfort.  This is the same symptom as she was having prior to her bypass grafting.  These episodes occur frequently through the day.  They do not necessarily worsen with exercise but it should be noted that he had she has severe shortness of breath  with exercise.  Her bypass grafts are 79 years old.  We will schedule her for a Lexiscan Myoview study.  She may need a cath if she has significant ischemia     2. Shortness breath with exertion/fatigue:       3. ESRD - s/p renal transplant.       4.  Hyperlipidemia:   5. DM  -  6.  HTN:  cont meds     Mertie Moores, MD  10/03/2022 5:41 PM    [address],  [address] [place], [place]  [address] Pager [phone] Phone: [phone]; Fax: [phone]

## 2022-10-03 ENCOUNTER — Encounter (HOSPITAL_COMMUNITY)
Admission: RE | Admit: 2022-10-03 | Discharge: 2022-10-03 | Disposition: A | Discharge status: Home / Self Care | Payer: Medicare Other | Source: Ambulatory Visit | Attending: Pulmonary Disease | Admitting: Pulmonary Disease

## 2022-10-03 ENCOUNTER — Encounter: Payer: Medicare Other | Admitting: Cardiovascular Disease

## 2022-10-03 ENCOUNTER — Encounter: Payer: Self-pay | Admitting: Cardiovascular Disease

## 2022-10-03 VITALS — BP 136/68 | HR 77 | Ht 62.0 in | Wt 138.8 lb

## 2022-10-03 DIAGNOSIS — J4550 Severe persistent asthma, uncomplicated: Secondary | ICD-10-CM | POA: Diagnosis not present

## 2022-10-03 DIAGNOSIS — Z951 Presence of aortocoronary bypass graft: Secondary | ICD-10-CM | POA: Diagnosis not present

## 2022-10-03 DIAGNOSIS — I25119 Atherosclerotic heart disease of native coronary artery with unspecified angina pectoris: Secondary | ICD-10-CM

## 2022-10-03 MED ORDER — NITROGLYCERIN 0.4 MG SL SUBL: 0.4000 mg | SUBLINGUAL_TABLET | SUBLINGUAL | 1 refills | Status: DC | PRN

## 2022-10-03 NOTE — Progress Notes (Signed)
Daily Session Note  Patient Details  Name: [name] MRN: [id number] Date of Birth: December 05, 1940 Referring Provider:   [name] Pulmonary Rehab Walk Test from 08/11/2022 in [facility], [facility], & [place]  Referring Provider [name]       Encounter Date: 10/03/2022  Check In:  Session Check In - 10/03/22 1130       Check-In   Supervising physician immediately available to respond to emergencies [facility] - Physician supervision    Physician(s) Shearon Stalls    Location [facility]    Staff Present [name], Cathleen Fears, MS, ACSM-CEP, Exercise Physiologist;Randi Yevonne Pax, ACSM-CEP, Exercise Physiologist    Virtual Visit No    Medication changes reported     No    Fall or balance concerns reported    No    Tobacco Cessation No Change    Warm-up and Cool-down Performed as group-led instruction    Resistance Training Performed Yes    VAD Patient? No    PAD/SET Patient? No      Pain Assessment   Currently in Pain? No/denies             Capillary Blood Glucose: No results found for this or any previous visit (from the past 24 hour(s)).    Social History   Tobacco Use  Smoking Status Former   Packs/day: 1.00   Years: 8.00   Total pack years: 8.00   Types: Cigarettes   Quit date: 11/21/1983   Years since quitting: 38.8  Smokeless Tobacco Never    Goals Met:  Proper associated with RPD/PD & O2 Sat Exercise tolerated well No report of concerns or symptoms today Strength training completed today  Goals Unmet:  Not Applicable  Comments: Service time is from 1009 to 1135.    Dr. Rodman Pickle is Medical Director for Pulmonary Rehab at [facility].

## 2022-10-03 NOTE — Patient Instructions (Signed)
Medication Instructions:  REFILLED Nitroglycerin *If you need a refill on your cardiac medications before your next appointment, please call your pharmacy*  Lab Work: NONE If you have labs (blood work) drawn today and your tests are completely normal, you will receive your results only by: [place] (if you have MyChart) OR A paper copy in the mail If you have any lab test that is abnormal or we need to change your treatment, we will call you to review the results.  Testing/Procedures: Leane Call Stress test Your physician has requested that you have a lexiscan myoview. For further information please visit [email or web address]. Please follow instruction sheet, as given.  Follow-Up: At [facility], you and your health needs are our priority.  As part of our continuing mission to provide you with exceptional heart care, we have created designated Provider Care Teams.  These Care Teams include your primary Cardiologist (physician) and Advanced Practice Providers (APPs -  Physician Assistants and Nurse Practitioners) who all work together to provide you with the care you need, when you need it.  We recommend signing up for the patient portal called "MyChart".  Sign up information is provided on this After Visit Summary.  MyChart is used to connect with patients for Virtual Visits (Telemedicine).  Patients are able to view lab/test results, encounter notes, upcoming appointments, etc.  Non-urgent messages can be sent to your provider as well.   To learn more about what you can do with MyChart, go to [email or web address].    Your next appointment:   6 month(s)  The format for your next appointment:   In Person  Provider:   Mertie Moores, MD      Other Instructions See instructions for stress test  Important Information About Sugar

## 2022-10-04 ENCOUNTER — Telehealth (HOSPITAL_COMMUNITY): Payer: Self-pay | Admitting: *Deleted

## 2022-10-04 NOTE — Telephone Encounter (Signed)
Left a detailed message on voice mail regarding her scheduled appointment on Friday 11/17 and to arrive at 7:45. According to her DPR on file.

## 2022-10-05 ENCOUNTER — Encounter (HOSPITAL_COMMUNITY)
Admission: RE | Admit: 2022-10-05 | Discharge: 2022-10-05 | Disposition: A | Discharge status: Home / Self Care | Payer: Medicare Other | Source: Ambulatory Visit | Attending: Pulmonary Disease | Admitting: Pulmonary Disease

## 2022-10-05 DIAGNOSIS — J4550 Severe persistent asthma, uncomplicated: Secondary | ICD-10-CM | POA: Diagnosis not present

## 2022-10-05 NOTE — Progress Notes (Signed)
Daily Session Note  Patient Details  Name: [name] MRN: [id number] Date of Birth: September 19, 1941 Referring Provider:   [name] Pulmonary Rehab Walk Test from 08/11/2022 in [facility] for Heart, Vascular, & [place]  Referring Provider [name]       Encounter Date: 10/05/2022  Check In:  Session Check In - 10/05/22 1130       Check-In   Supervising physician immediately available to respond to emergencies [facility] - Physician supervision    Physician(s) Dr. Shearon Stalls    [facility]    Staff Present Josephina Shih BS, ACSM-CEP, Exercise Physiologist;Jetta Gilford Rile BS, ACSM-CEP, Exercise Physiologist;Kaylee Rosana Hoes, MS, ACSM-CEP, Exercise Physiologist;Lisa Ysidro Evert, RN    Virtual Visit No    Medication changes reported     No    Fall or balance concerns reported    No    Tobacco Cessation No Change    Warm-up and Cool-down Performed as group-led instruction    Resistance Training Performed Yes    VAD Patient? No    PAD/SET Patient? No      Pain Assessment   Currently in Pain? No/denies    Multiple Pain Sites No             Capillary Blood Glucose: No results found for this or any previous visit (from the past 24 hour(s)).    Social History   Tobacco Use  Smoking Status Former   Packs/day: 1.00   Years: 8.00   Total pack years: 8.00   Types: Cigarettes   Quit date: 11/21/1983   Years since quitting: 38.8  Smokeless Tobacco Never    Goals Met:  Independence with exercise equipment Exercise tolerated well No report of concerns or symptoms today Strength training completed today  Goals Unmet:  Not Applicable  Comments: Service time is from 1006 to 1143.    Dr. Rodman Pickle is Medical Director for Pulmonary Rehab at [facility].

## 2022-10-05 NOTE — Progress Notes (Signed)
Home Exercise Prescription I have reviewed a Home Exercise Prescription with [name]. [name] is currently exercising at home. [name] is walking 2-3 non-rehab days/wk for 20-30 min/day. I encouraged her to exercise 3 non-rehab days/wk for 30 min/day. She agreed with my recommendations. [name] is very motivated to exercise and improve her functional capacity. She feels that she had benefited from rehab. The patient stated that their goals were to maintain her current conditioning level. We reviewed exercise guidelines, target heart rate during exercise, RPE Scale, weather conditions, endpoints for exercise, warmup and cool down. The patient is encouraged to come to me with any questions. I will continue to follow up with the patient to assist them with progression and safety.    Sheppard Plumber, MS, ACSM-CEP 10/05/2022 3:45 PM

## 2022-10-06 ENCOUNTER — Ambulatory Visit (HOSPITAL_COMMUNITY): Payer: Medicare Other | Attending: Cardiovascular Disease

## 2022-10-06 DIAGNOSIS — I25119 Atherosclerotic heart disease of native coronary artery with unspecified angina pectoris: Secondary | ICD-10-CM | POA: Insufficient documentation

## 2022-10-06 DIAGNOSIS — Z951 Presence of aortocoronary bypass graft: Secondary | ICD-10-CM | POA: Diagnosis present

## 2022-10-06 LAB — MYOCARDIAL PERFUSION IMAGING
LV dias vol: 92 mL (ref 46–106)
LV sys vol: 35 mL
Nuc Stress EF: 62 %
Peak HR: 86 {beats}/min
Rest HR: 65 {beats}/min
Rest Nuclear Isotope Dose: 10.8 mCi
SDS: 2
SRS: 2
SSS: 4
ST Depression (mm): 0 mm
Stress Nuclear Isotope Dose: 30.9 mCi
TID: 0.92

## 2022-10-06 MED ORDER — TECHNETIUM TC 99M TETROFOSMIN IV KIT
10.8000 | PACK | Freq: Once | INTRAVENOUS | Status: AC | PRN
  Administered 2022-10-06: 10.8 via INTRAVENOUS

## 2022-10-06 MED ORDER — TECHNETIUM TC 99M TETROFOSMIN IV KIT
30.9000 | PACK | Freq: Once | INTRAVENOUS | Status: AC | PRN
  Administered 2022-10-06: 30.9 via INTRAVENOUS

## 2022-10-06 MED ORDER — REGADENOSON 0.4 MG/5ML IV SOLN
0.4000 mg | Freq: Once | INTRAVENOUS | Status: AC
  Administered 2022-10-06: 0.4 mg via INTRAVENOUS

## 2022-10-06 MED ORDER — AMINOPHYLLINE 25 MG/ML IV SOLN
75.0000 mg | Freq: Once | INTRAVENOUS | Status: AC
  Administered 2022-10-06: 75 mg via INTRAVENOUS

## 2022-10-08 ENCOUNTER — Other Ambulatory Visit: Payer: Self-pay | Admitting: Cardiovascular Disease

## 2022-10-10 ENCOUNTER — Encounter (HOSPITAL_COMMUNITY)
Admission: RE | Admit: 2022-10-10 | Discharge: 2022-10-10 | Disposition: A | Discharge status: Home / Self Care | Payer: Medicare Other | Source: Ambulatory Visit | Attending: Pulmonary Disease | Admitting: Pulmonary Disease

## 2022-10-10 VITALS — Wt 138.0 lb

## 2022-10-10 DIAGNOSIS — J4550 Severe persistent asthma, uncomplicated: Secondary | ICD-10-CM

## 2022-10-10 NOTE — Progress Notes (Signed)
Daily Session Note  Patient Details  Name: [name] MRN: [id number] Date of Birth: May 27, 1941 Referring Provider:   [name] Pulmonary Rehab Walk Test from 08/11/2022 in [facility] for Heart, Vascular, & [place]  Referring Provider [name]       Encounter Date: 10/10/2022  Check In:  Session Check In - 10/10/22 1628       Check-In   Supervising physician immediately available to respond to emergencies [facility] - Physician supervision    Physician(s) Ernest Mallick    Location [facility]    Staff Present Josephina Shih BS, ACSM-CEP, Exercise Physiologist;Kaylee Rosana Hoes, MS, ACSM-CEP, Exercise Physiologist;Lisa Assunta Curtis, MS, Exercise Physiologist;Other;Samantha [place], RD, LDN    Virtual Visit No    Medication changes reported     No    Fall or balance concerns reported    No    Tobacco Cessation No Change    Warm-up and Cool-down Performed as group-led instruction    Resistance Training Performed Yes    VAD Patient? No    PAD/SET Patient? No      Pain Assessment   Currently in Pain? No/denies             Capillary Blood Glucose: No results found for this or any previous visit (from the past 24 hour(s)).   Exercise Prescription Changes - 10/10/22 1600       Response to Exercise   Blood Pressure (Admit) 144/68    Blood Pressure (Exercise) 160/64    Blood Pressure (Exit) 130/82    Heart Rate (Admit) 73 bpm    Heart Rate (Exercise) 84 bpm    Heart Rate (Exit) 82 bpm    Oxygen Saturation (Admit) 98 %    Oxygen Saturation (Exercise) 90 %    Oxygen Saturation (Exit) 94 %    Rating of Perceived Exertion (Exercise) 15    Perceived Dyspnea (Exercise) 2    Duration Continue with 30 min of aerobic exercise without signs/symptoms of physical distress.    Intensity THRR unchanged      Progression   Progression Continue to progress workloads to maintain intensity without signs/symptoms of physical distress.       Resistance Training   Training Prescription Yes    Weight Red Bands    Reps 10-15    Time 10 Minutes      Oxygen   Oxygen Continuous    Liters 4      Recumbant Bike   Level 2    Minutes 10    METs --   Didn't get METS, d/t pt c/o leg pain     NuStep   Level 4    SPM 60    Minutes 20    METs 20.2      Oxygen   Maintain Oxygen Saturation 88% or higher             Social History   Tobacco Use  Smoking Status Former   Packs/day: 1.00   Years: 8.00   Total pack years: 8.00   Types: Cigarettes   Quit date: 11/21/1983   Years since quitting: 38.9  Smokeless Tobacco Never    Goals Met:  Proper associated with RPD/PD & O2 Sat Exercise tolerated well No report of concerns or symptoms today  Goals Unmet:  Not Applicable  Comments: Service time is from [place] to [place]    Dr. Rodman Pickle is Medical Director for Pulmonary Rehab at [facility].

## 2022-10-11 NOTE — Progress Notes (Signed)
Pulmonary Individual Treatment Plan  Patient Details  Name: [name] MRN: [id number] Date of Birth: 1941/07/05 Referring Provider:   [name] Pulmonary Rehab Walk Test from 08/11/2022 in [facility] [place]  Referring Provider [name]       Initial Encounter Date:  Flowsheet Row Pulmonary Rehab Walk Test from 08/11/2022 in [facility]  Date 08/11/22       Visit Diagnosis: Severe persistent asthma without complication  Patient's Home Medications on Admission:   Current Outpatient Medications:    acetaminophen (TYLENOL) 650 MG CR tablet, Take 650 mg by mouth every morning., Disp: , Rfl:    albuterol (PROVENTIL) (2.5 MG/3ML) 0.083% nebulizer solution, Take 2.5 mg by nebulization every 6 (six) hours as needed for wheezing or shortness of breath., Disp: , Rfl:    albuterol (VENTOLIN HFA) 108 (90 Base) MCG/ACT inhaler, Inhale 1-2 puffs into the lungs every 6 (six) hours as needed for wheezing or shortness of breath., Disp: , Rfl:    amLODipine (NORVASC) 10 MG tablet, Take 10 mg by mouth daily. , Disp: , Rfl:    aspirin EC 81 MG tablet, Take 81 mg by mouth daily., Disp: , Rfl:    Azelastine HCl 0.15 % SOLN, Place 1 spray into both nostrils daily as needed for allergies., Disp: , Rfl:    budesonide (PULMICORT) 0.25 MG/2ML nebulizer solution, Inhale 0.25 mg into the lungs as needed., Disp: , Rfl:    calcitRIOL (ROCALTROL) 0.25 MCG capsule, Take 0.25 mcg by mouth daily. , Disp: , Rfl:    ezetimibe (ZETIA) 10 MG tablet, TAKE 1 TABLET BY MOUTH EVERY DAY, Disp: 90 tablet, Rfl: 1   fluticasone (FLONASE) 50 MCG/ACT nasal spray, Place into both nostrils., Disp: , Rfl:    fluticasone furoate-vilanterol (BREO ELLIPTA) 200-25 MCG/INH AEPB, Inhale 1 puff into the lungs daily as needed (respiratory issues.). , Disp: , Rfl:    glucose blood (ONETOUCH VERIO) test strip, 1 each by Other  route daily in the afternoon. Use as instructed, Disp: 100 each, Rfl: 3   isosorbide mononitrate (IMDUR) 30 MG 24 hr tablet, TAKE 1 TABLET BY MOUTH EVERY DAY, Disp: 90 tablet, Rfl: 2   KLOR-CON M20 20 MEQ tablet, TAKE 1 TABLET BY MOUTH EVERY DAY, Disp: 90 tablet, Rfl: 2   metoprolol tartrate (LOPRESSOR) 50 MG tablet, TAKE 1 TABLET BY MOUTH TWICE A DAY, Disp: 180 tablet, Rfl: 0   nitroGLYCERIN (NITROSTAT) 0.4 MG SL tablet, Place 1 tablet (0.4 mg total) under the tongue every 5 (five) minutes as needed for chest pain., Disp: 25 tablet, Rfl: 1   omeprazole (PRILOSEC) 20 MG capsule, Take 20 mg by mouth daily after breakfast., Disp: , Rfl:    predniSONE (DELTASONE) 5 MG tablet, Take 5 mg by mouth 2 (two) times daily., Disp: , Rfl:    rosuvastatin (CRESTOR) 40 MG tablet, Take 1 tablet (40 mg total) by mouth daily. Pt needs to make appt with provider for further refills - 2nd attempt, Disp: 15 tablet, Rfl: 0   sennosides-docusate sodium (SENOKOT-S) 8.6-50 MG tablet, Take 1 tablet by mouth daily as needed (constipation.). , Disp: , Rfl:    tacrolimus (PROGRAF) 1 MG capsule, Take 5 mg by mouth See admin instructions. 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, hold ASA 2-3 weeks - consider plavix instead of ASA   Asthma    Cellulitis 12/19/2019   right upper arm   Coronary artery disease    cath 11/2007, grafts patent /  Nuclear, June, 2011, prior inferior MI with mild peri-infarct ischemia, anterior breast attenuation, EF 67%, done for renal transplant assessment / Low level exercise/Lexiscan Myoview (11/2013): EF 67%, no ischemi; normal study   Diabetes mellitus    type 2   ESRD on dialysis [facility])    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, 2012   Family history of  breast cancer    Family history of prostate cancer    GERD (gastroesophageal reflux disease)    GI bleed 11/26/2010   January, 2012 , AVM   Gout    History of methicillin resistant staphylococcus aureus (MRSA)    Hyperlipidemia    Hyperparathyroidism    Hypertension    Myocardial infarction [facility])    Osteoporosis    Pneumonia 08/2010    Hospitalization, October, 2011   Primary osteoarthritis, 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   S/P kidney transplant    September, 2012, [facility]   Subacromial impingement of 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    Tobacco Use: Social History   Tobacco Use  Smoking Status Former   Packs/day: 1.00   Years: 8.00   Total pack years: 8.00   Types: Cigarettes   Quit date: 11/21/1983   Years since quitting: 38.9  Smokeless Tobacco Never    Labs: Review Flowsheet  More data exists      Latest Ref Rng & Units 10/17/2021 11/03/2021 12/27/2021 02/22/2022 07/10/2022  Labs for ITP Cardiac and Pulmonary Rehab  Cholestrol 100 - 199 mg/dL 138  - - - -  LDL (calc) 0 - 99 mg/dL 57  - - - -  HDL-C >39 mg/dL 65  - - - -  Trlycerides 0 - 149 mg/dL 82  - - - -  Hemoglobin A1c 4.0 - 5.6 % - 6.5  - 6.1  6.4   TCO2 22 - 32 mmol/L - - 24  - -    Capillary Blood Glucose: Lab Results  Component Value Date   GLUCAP 115 (H) 08/17/2022   GLUCAP 96 08/17/2022   GLUCAP 109 (H) 08/11/2022   GLUCAP 91 12/27/2021   GLUCAP 153 (H) 02/21/2018    POCT Glucose     Row Name 08/22/22 1220             POCT Blood Glucose   Pre-Exercise 104 mg/dL       Post-Exercise 130 mg/dL                Pulmonary Assessment Scores:  Pulmonary Assessment Scores     Row Name 08/11/22 1437 08/11/22 1613       ADL UCSD   SOB Score total -- 90      CAT Score   CAT Score -- 25      mMRC Score   mMRC Score 4 4            UCSD: Self-administered rating of dyspnea associated with activities of daily living (ADLs) 6-point scale (0 = "not at all" to 5 = "maximal or unable to do because of  breathlessness")  Scoring Scores range from 0 to 120.  Minimally important difference is 5 units  CAT:  CAT can identify the health impairment of COPD patients and is better correlated with disease progression.  CAT has a scoring range of zero to 40. The CAT score is classified into four groups of low (less than 10), medium (10 - 20), high (21-30) and very high (31-40) based on the impact level of disease on health status. A CAT score over 10 suggests significant symptoms.  A worsening CAT score could be explained by an exacerbation, poor medication adherence, poor inhaler technique, or progression of COPD or comorbid conditions.  CAT MCID is 2 points  mMRC: mMRC (Modified Medical Research Council) Dyspnea Scale is used to assess the degree of baseline functional disability in patients of respiratory disease due to dyspnea. No minimal important difference is established. A decrease in score of 1 point or greater is considered a positive change.   Pulmonary Function Assessment:   Exercise Target Goals: Exercise Program Goal: Individual exercise prescription set using results from initial 6 min walk test and THRR while considering  patient's activity barriers and safety.   Exercise Prescription Goal: Initial exercise prescription builds to 30-45 minutes a day of aerobic activity, 2-3 days per week.  Home exercise guidelines will be given to patient during program as part of exercise prescription that the participant will acknowledge.  Activity Barriers & Risk Stratification:  Activity Barriers & Cardiac Risk Stratification - 08/11/22 1434       Activity Barriers & Cardiac Risk Stratification   Activity Barriers Left Knee Replacement;Shortness of Breath;Left Hip Replacement;Muscular Weakness;Deconditioning;Balance Concerns    Cardiac Risk Stratification High             6 Minute Walk:  6 Minute Walk     Row Name 08/11/22 1619         6 Minute Walk   Phase Initial     Distance  562 feet     Walk Time 6 minutes     # of Rest Breaks 1     MPH 1.06     METS 2.2     RPE 12     Perceived Dyspnea  2     VO2 Peak 7.69     Symptoms Yes (comment)     Comments SOB and fatigue, standing rest 2:00 min to 2:23     Resting HR 85 bpm     Resting BP 140/60     Resting Oxygen Saturation  92 %     Exercise Oxygen Saturation  during 6 min walk 90 %     Max Ex. HR 88 bpm     Max Ex. BP 144/62     2 Minute Post BP 138/66       Interval HR   1 Minute HR 88     2 Minute HR 87     3 Minute HR 85     4 Minute HR 86     5 Minute HR 85     6 Minute HR 88     2 Minute Post HR 68     Interval Heart Rate? Yes       Interval Oxygen   Interval Oxygen? Yes     Baseline Oxygen Saturation % 92 %     1 Minute Oxygen Saturation % 96 %     1 Minute Liters of Oxygen 2 L     2 Minute Oxygen Saturation % 94 %     2 Minute Liters of Oxygen 0 L     3 Minute Oxygen  Saturation % 92 %     3 Minute Liters of Oxygen 2 L     4 Minute Oxygen Saturation % 94 %     4 Minute Liters of Oxygen 2 L     5 Minute Oxygen Saturation % 90 %     5 Minute Liters of Oxygen 2 L     6 Minute Oxygen Saturation % 92 %     6 Minute Liters of Oxygen 2 L     2 Minute Post Oxygen Saturation % 91 %     2 Minute Post Liters of Oxygen 2 L              Oxygen Initial Assessment:  Oxygen Initial Assessment - 08/11/22 1431       Home Oxygen   Home Oxygen Device Home Concentrator    Sleep Oxygen Prescription Continuous    Liters per minute 2    Home Exercise Oxygen Prescription Continuous    Liters per minute 2    Home Resting Oxygen Prescription Continuous    Liters per minute 2    Compliance with Home Oxygen Use Yes      Initial 6 min Walk   Oxygen Used Continuous    Liters per minute 2      Program Oxygen Prescription   Program Oxygen Prescription Continuous    Liters per minute 2      Intervention   Short Term Goals To learn and exhibit compliance with exercise, home and travel O2  prescription;To learn and understand importance of monitoring SPO2 with pulse oximeter and demonstrate accurate use of the pulse oximeter.;To learn and understand importance of maintaining oxygen saturations>88%;To learn and demonstrate proper pursed lip breathing techniques or other breathing techniques. ;To learn and demonstrate proper use of respiratory medications    Long  Term Goals Demonstrates proper use of MDI's;Compliance with respiratory medication;Exhibits proper breathing techniques, such as pursed lip breathing or other method taught during program session;Maintenance of O2 saturations>88%;Verbalizes importance of monitoring SPO2 with pulse oximeter and return demonstration;Exhibits compliance with exercise, home  and travel O2 prescription             Oxygen Re-Evaluation:  Oxygen Re-Evaluation     Row Name 08/14/22 0749 09/04/22 1148 10/04/22 0734         Program Oxygen Prescription   Program Oxygen Prescription Continuous Continuous Continuous     Liters per minute _0 Comments -- O2 increase for track walking. O2 sat is 91% on 4L. --       Home Oxygen   Home Oxygen Device Home Concentrator Home Concentrator Home Concentrator     Sleep Oxygen Prescription Continuous Continuous Continuous     Liters per minute _1 Home Exercise Oxygen Prescription Continuous Continuous Continuous     Liters per minute _2 Home Resting Oxygen Prescription Continuous Continuous Continuous     Liters per minute _3 Compliance with Home Oxygen Use Yes Yes Yes       Goals/Expected Outcomes   Short Term Goals To learn and exhibit compliance with exercise, home and travel O2 prescription;To learn and understand importance of monitoring SPO2 with pulse oximeter and demonstrate accurate use of the pulse oximeter.;To learn and understand importance of maintaining oxygen saturations>88%;To learn and demonstrate proper pursed lip breathing techniques or other breathing  techniques. ;To learn and demonstrate proper use  of respiratory medications To learn and exhibit compliance with exercise, home and travel O2 prescription;To learn and understand importance of monitoring SPO2 with pulse oximeter and demonstrate accurate use of the pulse oximeter.;To learn and understand importance of maintaining oxygen saturations>88%;To learn and demonstrate proper pursed lip breathing techniques or other breathing techniques. ;To learn and demonstrate proper use of respiratory medications To learn and exhibit compliance with exercise, home and travel O2 prescription;To learn and understand importance of monitoring SPO2 with pulse oximeter and demonstrate accurate use of the pulse oximeter.;To learn and understand importance of maintaining oxygen saturations>88%;To learn and demonstrate proper pursed lip breathing techniques or other breathing techniques. ;To learn and demonstrate proper use of respiratory medications     Long  Term Goals Demonstrates proper use of MDI's;Compliance with respiratory medication;Exhibits proper breathing techniques, such as pursed lip breathing or other method taught during program session;Maintenance of O2 saturations>88%;Verbalizes importance of monitoring SPO2 with pulse oximeter and return demonstration;Exhibits compliance with exercise, home  and travel O2 prescription Demonstrates proper use of MDI's;Compliance with respiratory medication;Exhibits proper breathing techniques, such as pursed lip breathing or other method taught during program session;Maintenance of O2 saturations>88%;Verbalizes importance of monitoring SPO2 with pulse oximeter and return demonstration;Exhibits compliance with exercise, home  and travel O2 prescription Demonstrates proper use of MDI's;Compliance with respiratory medication;Exhibits proper breathing techniques, such as pursed lip breathing or other method taught during program session;Maintenance of O2 saturations>88%;Verbalizes  importance of monitoring SPO2 with pulse oximeter and return demonstration;Exhibits compliance with exercise, home  and travel O2 prescription     Goals/Expected Outcomes Compliance and understanding of oxygen saturation monitoring and breathing techniques to decrease shortness of breath. Compliance and understanding of oxygen saturation monitoring and breathing techniques to decrease shortness of breath. Compliance and understanding of oxygen saturation monitoring and breathing techniques to decrease shortness of breath.              Oxygen Discharge (Final Oxygen Re-Evaluation):  Oxygen Re-Evaluation - 10/04/22 0734       Program Oxygen Prescription   Program Oxygen Prescription Continuous    Liters per minute 4      Home Oxygen   Home Oxygen Device Home Concentrator    Sleep Oxygen Prescription Continuous    Liters per minute 2    Home Exercise Oxygen Prescription Continuous    Liters per minute 2    Home Resting Oxygen Prescription Continuous    Liters per minute 2    Compliance with Home Oxygen Use Yes      Goals/Expected Outcomes   Short Term Goals To learn and exhibit compliance with exercise, home and travel O2 prescription;To learn and understand importance of monitoring SPO2 with pulse oximeter and demonstrate accurate use of the pulse oximeter.;To learn and understand importance of maintaining oxygen saturations>88%;To learn and demonstrate proper pursed lip breathing techniques or other breathing techniques. ;To learn and demonstrate proper use of respiratory medications    Long  Term Goals Demonstrates proper use of MDI's;Compliance with respiratory medication;Exhibits proper breathing techniques, such as pursed lip breathing or other method taught during program session;Maintenance of O2 saturations>88%;Verbalizes importance of monitoring SPO2 with pulse oximeter and return demonstration;Exhibits compliance with exercise, home  and travel O2 prescription    Goals/Expected  Outcomes Compliance and understanding of oxygen saturation monitoring and breathing techniques to decrease shortness of breath.             Initial Exercise Prescription:  Initial Exercise Prescription - 08/11/22 1600  Date of Initial Exercise RX and Referring Provider   Date 08/11/22    Referring Provider [name]    Expected Discharge Date 10/19/22      Oxygen   Oxygen Continuous    Liters 2    Maintain Oxygen Saturation 88% or higher      NuStep   Level 1    SPM 60    Minutes 15      Track   Minutes 15    METs 2      Prescription Details   Frequency (times per week) 2    Duration Progress to 30 minutes of continuous aerobic without signs/symptoms of physical distress      Intensity   THRR 40-80% of Max Heartrate 56-113    Ratings of Perceived Exertion 11-13    Perceived Dyspnea 0-4      Progression   Progression Continue progressive overload as per policy without signs/symptoms or physical distress.      Resistance Training   Training Prescription Yes    Weight red bands    Reps 10-15             Perform Capillary Blood Glucose checks as needed.  Exercise Prescription Changes:   Exercise Prescription Changes     Row Name 08/22/22 1200 09/05/22 1200 09/14/22 1200 09/26/22 1200 10/10/22 1600     Response to Exercise   Blood Pressure (Admit) 114/64 108/60 142/60 114/58 144/68   Blood Pressure (Exercise) 168/80 134/78 142/70 140/70 160/64   Blood Pressure (Exit) 136/58 102/66 122/64 106/56 130/82   Heart Rate (Admit) 83 bpm 77 bpm 63 bpm 72 bpm 73 bpm   Heart Rate (Exercise) 92 bpm 82 bpm 70 bpm 85 bpm 84 bpm   Heart Rate (Exit) 90 bpm 76 bpm 79 bpm 67 bpm 82 bpm   Oxygen Saturation (Admit) 93 % 94 % 85 % 94 % 98 %   Oxygen Saturation (Exercise) 88 % 90 % 85 % 89 % 90 %   Oxygen Saturation (Exit) 90 % 94 % 92 % 97 % 94 %   Rating of Perceived Exertion (Exercise) _0 Perceived Dyspnea (Exercise) _1 Duration Progress to  30 minutes of  aerobic without signs/symptoms of physical distress Progress to 30 minutes of  aerobic without signs/symptoms of physical distress Continue with 30 min of aerobic exercise without signs/symptoms of physical distress. Continue with 30 min of aerobic exercise without signs/symptoms of physical distress. Continue with 30 min of aerobic exercise without signs/symptoms of physical distress.   Intensity Other (comment)  40-80% of HRR THRR unchanged THRR unchanged THRR unchanged THRR unchanged     Progression   Progression Continue to progress workloads to maintain intensity without signs/symptoms of physical distress. Continue to progress workloads to maintain intensity without signs/symptoms of physical distress. Continue to progress workloads to maintain intensity without signs/symptoms of physical distress. Continue to progress workloads to maintain intensity without signs/symptoms of physical distress. Continue to progress workloads to maintain intensity without signs/symptoms of physical distress.     Resistance Training   Training Prescription _2    Weight _3    Reps 10-15 10-15 10-15 10-15 10-15   Time -- 10 Minutes 10 Minutes 10 Minutes 10 Minutes     Interval Training   Interval Training -- -- No -- --     Oxygen   Oxygen Continuous Continuous  Continuous Continuous Continuous   Liters 2 3-4 3-4 3-4 4     Recumbant Bike   Level -- -- -- -- 2   Minutes -- -- -- -- 10   METs -- -- -- -- --  Didn't get METS, d/t pt c/o leg pain     NuStep   Level _0 SPM 60 60 60 60 60   Minutes _1 METs 1.7 1.7 1.6 1.7 20.2     Track   Laps _2 --   Minutes _3 --   METs -- -- 1.93 -- --     Oxygen   Maintain Oxygen Saturation -- -- -- -- 88% or higher            Exercise Comments:   Exercise Comments     Row Name 08/17/22 1214 10/05/22 1537         Exercise Comments Pt  completed first day of exercise. She exercised for 15 min on the Nustep and track. Keasia averaged 1.5 METs at level 1 on the Nustep and 1.5 METs on the track. Dalphine performed the warmup and cooldown standing holding onto a chair with verbal cues. Discussed METs and how to increase METs. Completed home exercise plan. Darthula is currently exercising at home. Addisson is walking 2-3 non-rehab days/wk for 20-30 min/day. I encouraged her to exercise 3 non-rehab days/wk for 30 min/day. She agreed with my recommendations. Maraya is very motivated to exercise and improve her functional capacity. She feels that she had benefited from rehab.               Exercise Goals and Review:   Exercise Goals     Row Name 08/11/22 1435 08/14/22 0748 09/04/22 1145 10/04/22 0728       Exercise Goals   Increase Physical Activity Yes Yes Yes Yes    Intervention Provide advice, education, support and counseling about physical activity/exercise needs.;Develop an individualized exercise prescription for aerobic and resistive training based on initial evaluation findings, risk stratification, comorbidities and participant's personal goals. Provide advice, education, support and counseling about physical activity/exercise needs.;Develop an individualized exercise prescription for aerobic and resistive training based on initial evaluation findings, risk stratification, comorbidities and participant's personal goals. Provide advice, education, support and counseling about physical activity/exercise needs.;Develop an individualized exercise prescription for aerobic and resistive training based on initial evaluation findings, risk stratification, comorbidities and participant's personal goals. Provide advice, education, support and counseling about physical activity/exercise needs.;Develop an individualized exercise prescription for aerobic and resistive training based on initial evaluation findings, risk stratification,  comorbidities and participant's personal goals.    Expected Outcomes Short Term: Attend rehab on a regular basis to increase amount of physical activity.;Long Term: Add in home exercise to make exercise part of routine and to increase amount of physical activity.;Long Term: Exercising regularly at least 3-5 days a week. Short Term: Attend rehab on a regular basis to increase amount of physical activity.;Long Term: Add in home exercise to make exercise part of routine and to increase amount of physical activity.;Long Term: Exercising regularly at least 3-5 days a week. Short Term: Attend rehab on a regular basis to increase amount of physical activity.;Long Term: Add in home exercise to make exercise part of routine and to increase amount of physical activity.;Long Term: Exercising regularly at least 3-5 days a week. Short Term: Attend rehab on a regular basis to increase amount of  physical activity.;Long Term: Add in home exercise to make exercise part of routine and to increase amount of physical activity.;Long Term: Exercising regularly at least 3-5 days a week.    Increase Strength and Stamina Yes Yes Yes Yes    Intervention Provide advice, education, support and counseling about physical activity/exercise needs.;Develop an individualized exercise prescription for aerobic and resistive training based on initial evaluation findings, risk stratification, comorbidities and participant's personal goals. Provide advice, education, support and counseling about physical activity/exercise needs.;Develop an individualized exercise prescription for aerobic and resistive training based on initial evaluation findings, risk stratification, comorbidities and participant's personal goals. Provide advice, education, support and counseling about physical activity/exercise needs.;Develop an individualized exercise prescription for aerobic and resistive training based on initial evaluation findings, risk stratification,  comorbidities and participant's personal goals. Provide advice, education, support and counseling about physical activity/exercise needs.;Develop an individualized exercise prescription for aerobic and resistive training based on initial evaluation findings, risk stratification, comorbidities and participant's personal goals.    Expected Outcomes Short Term: Increase workloads from initial exercise prescription for resistance, speed, and METs.;Short Term: Perform resistance training exercises routinely during rehab and add in resistance training at home;Long Term: Improve cardiorespiratory fitness, muscular endurance and strength as measured by increased METs and functional capacity (6MWT) Short Term: Increase workloads from initial exercise prescription for resistance, speed, and METs.;Short Term: Perform resistance training exercises routinely during rehab and add in resistance training at home;Long Term: Improve cardiorespiratory fitness, muscular endurance and strength as measured by increased METs and functional capacity (6MWT) Short Term: Increase workloads from initial exercise prescription for resistance, speed, and METs.;Short Term: Perform resistance training exercises routinely during rehab and add in resistance training at home;Long Term: Improve cardiorespiratory fitness, muscular endurance and strength as measured by increased METs and functional capacity (6MWT) Short Term: Increase workloads from initial exercise prescription for resistance, speed, and METs.;Short Term: Perform resistance training exercises routinely during rehab and add in resistance training at home;Long Term: Improve cardiorespiratory fitness, muscular endurance and strength as measured by increased METs and functional capacity (6MWT)    Able to understand and use rate of perceived exertion (RPE) scale Yes Yes Yes Yes    Intervention Provide education and explanation on how to use RPE scale Provide education and explanation on  how to use RPE scale Provide education and explanation on how to use RPE scale Provide education and explanation on how to use RPE scale    Expected Outcomes Short Term: Able to use RPE daily in rehab to express subjective intensity level;Long Term:  Able to use RPE to guide intensity level when exercising independently Short Term: Able to use RPE daily in rehab to express subjective intensity level;Long Term:  Able to use RPE to guide intensity level when exercising independently Short Term: Able to use RPE daily in rehab to express subjective intensity level;Long Term:  Able to use RPE to guide intensity level when exercising independently Short Term: Able to use RPE daily in rehab to express subjective intensity level;Long Term:  Able to use RPE to guide intensity level when exercising independently    Able to understand and use Dyspnea scale Yes Yes Yes Yes    Intervention Provide education and explanation on how to use Dyspnea scale Provide education and explanation on how to use Dyspnea scale Provide education and explanation on how to use Dyspnea scale Provide education and explanation on how to use Dyspnea scale    Expected Outcomes Short Term: Able to use Dyspnea scale  daily in rehab to express subjective sense of shortness of breath during exertion;Long Term: Able to use Dyspnea scale to guide intensity level when exercising independently Short Term: Able to use Dyspnea scale daily in rehab to express subjective sense of shortness of breath during exertion;Long Term: Able to use Dyspnea scale to guide intensity level when exercising independently Short Term: Able to use Dyspnea scale daily in rehab to express subjective sense of shortness of breath during exertion;Long Term: Able to use Dyspnea scale to guide intensity level when exercising independently Short Term: Able to use Dyspnea scale daily in rehab to express subjective sense of shortness of breath during exertion;Long Term: Able to use Dyspnea  scale to guide intensity level when exercising independently    Knowledge and understanding of Target Heart Rate Range (THRR) Yes Yes Yes Yes    Intervention Provide education and explanation of THRR including how the numbers were predicted and where they are located for reference Provide education and explanation of THRR including how the numbers were predicted and where they are located for reference Provide education and explanation of THRR including how the numbers were predicted and where they are located for reference Provide education and explanation of THRR including how the numbers were predicted and where they are located for reference    Expected Outcomes Short Term: Able to state/look up THRR;Long Term: Able to use THRR to govern intensity when exercising independently;Short Term: Able to use daily as guideline for intensity in rehab Short Term: Able to state/look up THRR;Long Term: Able to use THRR to govern intensity when exercising independently;Short Term: Able to use daily as guideline for intensity in rehab Short Term: Able to state/look up THRR;Long Term: Able to use THRR to govern intensity when exercising independently;Short Term: Able to use daily as guideline for intensity in rehab Short Term: Able to state/look up THRR;Long Term: Able to use THRR to govern intensity when exercising independently;Short Term: Able to use daily as guideline for intensity in rehab    Understanding of Exercise Prescription Yes Yes Yes Yes    Intervention Provide education, explanation, and written materials on patient's individual exercise prescription Provide education, explanation, and written materials on patient's individual exercise prescription Provide education, explanation, and written materials on patient's individual exercise prescription Provide education, explanation, and written materials on patient's individual exercise prescription    Expected Outcomes Short Term: Able to explain program  exercise prescription;Long Term: Able to explain home exercise prescription to exercise independently Short Term: Able to explain program exercise prescription;Long Term: Able to explain home exercise prescription to exercise independently Short Term: Able to explain program exercise prescription;Long Term: Able to explain home exercise prescription to exercise independently Short Term: Able to explain program exercise prescription;Long Term: Able to explain home exercise prescription to exercise independently             Exercise Goals Re-Evaluation :  Exercise Goals Re-Evaluation     Row Name 08/14/22 0752 09/04/22 1145 10/04/22 0728         Exercise Goal Re-Evaluation   Exercise Goals Review Increase Physical Activity;Increase Strength and Stamina;Able to understand and use rate of perceived exertion (RPE) scale;Able to understand and use Dyspnea scale;Knowledge and understanding of Target Heart Rate Range (THRR);Understanding of Exercise Prescription Increase Physical Activity;Increase Strength and Stamina;Able to understand and use rate of perceived exertion (RPE) scale;Able to understand and use Dyspnea scale;Knowledge and understanding of Target Heart Rate Range (THRR);Understanding of Exercise Prescription Increase Physical Activity;Increase Strength and Stamina;Able  to understand and use rate of perceived exertion (RPE) scale;Able to understand and use Dyspnea scale;Knowledge and understanding of Target Heart Rate Range (THRR);Understanding of Exercise Prescription     Comments Pt is scheduled to begin exercise this week. Chessie has completed 5 exercise sessions. She exercises on the Nustep and track for 15 min. Bevelyn averages 1.7 METs at level 2 on the Nustep and 2.04 METs on the track. She performs the warmup and cooldown standing/seated holding onto a chair dependent on her shortness of breath. Essence is very deconditioned making it difficult to progress her. She is very motivated to  exercise and increase her functional capacity. Will continue to monitor and progress as able. Aviah has completed 13 exercise sessions. She exercises on the Nustep and track for 15 min. Michelina averages 1.6 METs at level 4 on the Nustep and 2.1 METs on the track. She performs the warmup and cooldown standing/seated holding onto a chair dependent on her shortness of breath. Alaynna has increased her workload for the Nustep despite her METs remaining the same. She is very motivated to exercise. We recently discusse home exercise as Karianne is walking at home. She is trying to keep up her conditioning level. Will continue to monitor and progress as able.     Expected Outcomes Through exercise at rehab and home, the patient will decrease shortness of breath with daily activities and feel confident in carrying out an exercise regimen. Through exercise at rehab and home, the patient will decrease shortness of breath with daily activities and feel confident in carrying out an exercise regimen. Through exercise at rehab and home, the patient will decrease shortness of breath with daily activities and feel confident in carrying out an exercise regimen.              Discharge Exercise Prescription (Final Exercise Prescription Changes):  Exercise Prescription Changes - 10/10/22 1600       Response to Exercise   Blood Pressure (Admit) 144/68    Blood Pressure (Exercise) 160/64    Blood Pressure (Exit) 130/82    Heart Rate (Admit) 73 bpm    Heart Rate (Exercise) 84 bpm    Heart Rate (Exit) 82 bpm    Oxygen Saturation (Admit) 98 %    Oxygen Saturation (Exercise) 90 %    Oxygen Saturation (Exit) 94 %    Rating of Perceived Exertion (Exercise) 15    Perceived Dyspnea (Exercise) 2    Duration Continue with 30 min of aerobic exercise without signs/symptoms of physical distress.    Intensity THRR unchanged      Progression   Progression Continue to progress workloads to maintain intensity without  signs/symptoms of physical distress.      Resistance Training   Training Prescription Yes    Weight Red Bands    Reps 10-15    Time 10 Minutes      Oxygen   Oxygen Continuous    Liters 4      Recumbant Bike   Level 2    Minutes 10    METs --   Didn't get METS, d/t pt c/o leg pain     NuStep   Level 4    SPM 60    Minutes 20    METs 20.2      Oxygen   Maintain Oxygen Saturation 88% or higher             Nutrition:  Target Goals: Understanding of nutrition guidelines, daily intake of sodium <1532m, cholesterol <  23m, calories 30% from fat and 7% or less from saturated fats, daily to have 5 or more servings of fruits and vegetables.  Biometrics:  Pre Biometrics - 08/11/22 1615       Pre Biometrics   Grip Strength 24 kg              Nutrition Therapy Plan and Nutrition Goals:  Nutrition Therapy & Goals - 10/03/22 1043       Nutrition Therapy   Diet Heart Healthy/Carbohydrate Consistent diet    Drug/Food Interactions Statins/Certain Fruits      Personal Nutrition Goals   Nutrition Goal Patient to use the plate method as a guide for daily meal planning to include lean protein/plant protein, fruits, vegetables, whole grains, and nonfat/lowfat dairy as part of heart healthy lifestyle    Personal Goal #2 Patient to understand strategies for weight gain/weight maintenance of 0.5-2.0# per week    Personal Goal #3 Patient to limit sodium intake to <15027mdaily    Personal Goal #4 Patient to identify food sources and limit daily intake of saturated fat, trans fat, sodium, and refined carbohydrates    Comments Lindley's weight is down 2.4# since starting with our program. HaEvangelynneports adequate appetite/ no changes to appetite and she continues to eat three meals daily. She continues to eat a wide variety of foods including lean protein, fruits, vegetables, and whole grains. Her primary care doctor has placed referrals for a home health aide, social work consult,  and OT [place]. Her grandson continues to offer assistance in cleaning, meal preparation, etc.      Intervention Plan   Intervention Prescribe, educate and counsel regarding individualized specific dietary modifications aiming towards targeted core components such as weight, hypertension, lipid management, diabetes, heart failure and other comorbidities.;Nutrition handout(s) given to patient.    Expected Outcomes Short Term Goal: Understand basic principles of dietary content, such as calories, fat, sodium, cholesterol and nutrients.;Long Term Goal: Adherence to prescribed nutrition plan.             Nutrition Assessments:  Nutrition Assessments - 08/17/22 1053       Rate Your Plate Scores   Pre Score 48            MEDIFICTS Score Key: ?70 Need to make dietary changes  40-70 Heart Healthy Diet ? 40 Therapeutic Level Cholesterol Diet  Flowsheet Row [facility] RE[facility]  Picture Your Plate Total Score on Admission 48      Picture Your Plate Scores: <4<16nhealthy dietary pattern with much room for improvement. 41-50 Dietary pattern unlikely to meet recommendations for good health and room for improvement. 51-60 More healthful dietary pattern, with some room for improvement.  >60 Healthy dietary pattern, although there may be some specific behaviors that could be improved.    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 She also has hx of CAD s/p CABG, COPD. Most recent labs show A1c 6.4, BUN 23, CR 1.61, GRF 30 Cr 1.4, BUN 21, GFR 38; [facility] transplant team continues to  monitor renal labs. No new labs at this time.     Expected Outcome Ryka reports typical weight of 160-165#; she reports unwanted weight loss over the last >4 years. Per documentation, she was 153# on 08/01/2021 (down 9.8# in 12 months). She does lives alone and does her own grocery shopping and cooking. She reports eating a wide variety of foods and does eat three meals per day.  She is mindful of intake of refined carbohydrates and carbohydrate servings to support adequate blood sugar control. She does report variable appetite. She has good support from her adult grandson who visit her home daily and calls multiple times per day. Discussed increasing eating frequency/ implementing 1-2 snacks per day to support nutrition needs and weight maintenance. Mehreen's weight is down 1.1# over the last 4 weeks. Venetia reports increasing eating frequency to 4 meals per day up from 3 regular meals per day. Discussed increasing eating frequency to five meals per day and/or adding in nutrition supplements such as Ensure Clear twice daily (360kcals, 16 protein). She reports good appetite. Encouraged patient to follow-up with primary care regarding referral or home health aid/social work to aid with ADLs. Patient reports continued support from her grandson. Eufelia's weight is down 2.4# since starting with our program. Keeghan reports adequate appetite/ no changes to appetite and she continues to eat three meals daily. She continues to eat a wide variety of foods including lean protein, fruits, vegetables, and whole grains. She has not added any nutrition supplements as previously recommended. Her primary care doctor has placed referrals for a home health aide, social work consult, and OT consult for ADLS to [place] in living independently. Her grandson continues to offer assistance in cleaning, meal preparation, etc.              Nutrition Goals Discharge (Final Nutrition Goals Re-Evaluation):  Nutrition Goals  Re-Evaluation - 10/03/22 1043       Goals   Current Weight 137 lb 12.6 oz (62.5 kg)    Comment No new labs at this time.    Expected Outcome Mailey's weight is down 2.4# since starting with our program. Stephane reports adequate appetite/ no changes to appetite and she continues to eat three meals daily. She continues to eat a wide variety of foods including lean protein, fruits, vegetables, and whole grains. She has not added any nutrition supplements as previously recommended. Her primary care doctor has placed referrals for a home health aide, social work consult, and OT consult for ADLS to [place] in living independently. Her grandson continues to offer assistance in cleaning, meal preparation, etc.             Psychosocial: Target Goals: Acknowledge presence or absence of significant depression and/or stress, maximize coping skills, provide positive support system. Participant is able to verbalize types and ability to use techniques and skills needed for reducing stress and depression.  Initial Review & Psychosocial Screening:  Initial Psych Review & Screening - 08/11/22 1428       Initial Review   Current issues with None Identified      Family Dynamics   Good Support System? No    Comments has four children who live in [place] that do not help or concerned about her health.  Yolanda Bonine helps with errands.       Barriers   Psychosocial barriers to participate in program The patient should benefit from training in stress management and relaxation.      Screening Interventions  Interventions Encouraged to exercise    Expected Outcomes Short Term goal: Identification and review with participant of any Quality of Life or Depression concerns found by scoring the questionnaire.;Long Term goal: The participant improves quality of Life and PHQ9 Scores as seen by post scores and/or verbalization of changes             Quality of Life Scores:  Scores of 19 and below  usually indicate a poorer quality of life in these areas.  A difference of  2-3 points is a clinically meaningful difference.  A difference of 2-3 points in the total score of the Quality of Life Index has been associated with significant improvement in overall quality of life, self-image, physical symptoms, and general health in studies assessing change in quality of life.  PHQ-9: Review Flowsheet       08/11/2022 09/26/2018 07/01/2018  Depression screen PHQ 2/9  Decreased Interest 0 0 0  Down, Depressed, Hopeless 2 1 0  PHQ - 2 Score 2 1 0  Altered sleeping 0 0 0  Tired, decreased energy _0 Change in appetite 1 0 0  Feeling bad or failure about yourself  0 1 0  Trouble concentrating 1 0 0  Moving slowly or fidgety/restless 1 0 0  Suicidal thoughts 0 - -  PHQ-9 Score _1 Difficult doing work/chores Not difficult at all Not difficult at all Not difficult at all   Interpretation of Total Score  Total Score Depression Severity:  1-4 = Minimal depression, 5-9 = Mild depression, 10-14 = Moderate depression, 15-19 = Moderately severe depression, 20-27 = Severe depression   Psychosocial Evaluation and Intervention:  Psychosocial Evaluation - 08/16/22 0818       Psychosocial Evaluation & Interventions   Interventions Encouraged to exercise with the program and follow exercise prescription    Comments Pt is scheduled to exercise this week. Kaysha does not seem to have pyschosocial barriers.    Expected Outcomes For Khanh to attend rehab free of psychosocial concerns.    Continue Psychosocial Services  No Follow up required             Psychosocial Re-Evaluation:  Psychosocial Re-Evaluation     [place] Name 08/16/22 0824 09/07/22 1626 10/04/22 0735         Psychosocial Re-Evaluation   Current issues with None Identified None Identified None Identified     Comments no identifiable barriers Marriah feels like she is doing well in rehab. She is currently working towards getting  a home health aid to help her with daily activities. Ravyn is excited to recieve some help with her chores as they fatigue her easily. Abeer feels like she is doing well in rehab. She still working on getting a home health aid. Referrals placed for home health aid, social work, and OT. Yolanda Bonine is still assisting with some ADLs.     Expected Outcomes For pt to attend rehab free of psychosocial concerns. For pt to attend rehab free of psychosocial concerns. For pt to attend rehab free of psychosocial concerns.     Interventions Encouraged to attend Pulmonary Rehabilitation for the exercise Encouraged to attend Pulmonary Rehabilitation for the exercise Encouraged to attend Pulmonary Rehabilitation for the exercise     Continue Psychosocial Services  No Follow up required No Follow up required No Follow up required              Psychosocial Discharge (Final Psychosocial Re-Evaluation):  Psychosocial Re-Evaluation - 10/04/22 7588  Psychosocial Re-Evaluation   Current issues with None Identified    Comments Nayali feels like she is doing well in rehab. She still working on getting a home health aid. Referrals placed for home health aid, social work, and OT. Yolanda Bonine is still assisting with some ADLs.    Expected Outcomes For pt to attend rehab free of psychosocial concerns.    Interventions Encouraged to attend Pulmonary Rehabilitation for the exercise    Continue Psychosocial Services  No Follow up required             Education: Education Goals: Education classes will be provided on a weekly basis, covering required topics. Participant will state understanding/return demonstration of topics presented.  Learning Barriers/Preferences:  Learning Barriers/Preferences - 08/11/22 1429       Learning Barriers/Preferences   Learning Barriers Sight    Learning Preferences Written Material;Verbal Instruction;Group Instruction;Skilled Demonstration             Education  Topics: Introduction to Pulmonary Rehab Group instruction provided by PowerPoint, verbal discussion, and written material to support subject matter. Instructor reviews what Pulmonary Rehab is, the purpose of the program, and how patients are referred.     Know Your Numbers Group instruction that is supported by a PowerPoint presentation. Instructor discusses importance of knowing and understanding resting, exercise, and post-exercise oxygen saturation, heart rate, and blood pressure. Oxygen saturation, heart rate, blood pressure, rating of perceived exertion, and dyspnea are reviewed along with a normal range for these values.  Flowsheet Row PULMONARY REHAB OTHER RESPIRATORY from 10/05/2022 in [facility]  Date 09/07/22  Educator EP  Instruction Review Code 1- Verbalizes Understanding       Exercise for the Pulmonary Patient Group instruction that is supported by a PowerPoint presentation. Instructor discusses benefits of exercise, core components of exercise, frequency, duration, and intensity of an exercise routine, importance of utilizing pulse oximetry during exercise, safety while exercising, and options of places to exercise outside of rehab.  Flowsheet Row PULMONARY REHAB OTHER RESPIRATORY from 09/26/2018 in [facility]  Date 09/05/18  Educator EP  Instruction Review Code 1- Verbalizes Understanding          MET Level  Group instruction provided by PowerPoint, verbal discussion, and written material to support subject matter. Instructor reviews what METs are and how to increase METs.    Pulmonary Medications Verbally interactive group education provided by instructor with focus on inhaled medications and proper administration. Flowsheet Row PULMONARY REHAB OTHER RESPIRATORY from 09/26/2018 in [facility], [facility]   Date 07/30/18  Instruction Review Code 1- Verbalizes Understanding       Anatomy and Physiology of the Respiratory System Group instruction provided by PowerPoint, verbal discussion, and written material to support subject matter. Instructor reviews respiratory cycle and anatomical components of the respiratory system and their functions. Instructor also reviews differences in obstructive and restrictive respiratory diseases with examples of each.  Flowsheet Row PULMONARY REHAB OTHER RESPIRATORY from 10/05/2022 in [facility]  Date 08/31/22  Educator EP  Instruction Review Code 1- Verbalizes Understanding       Oxygen Safety Group instruction provided by PowerPoint, verbal discussion, and written material to support subject matter. There is an overview of "What is Oxygen" and "Why do we need it".  Instructor also reviews how to create  a safe environment for oxygen use, the importance of using oxygen as prescribed, and the risks of noncompliance. There is a brief discussion on traveling with oxygen and resources the patient may utilize. Flowsheet Row PULMONARY REHAB OTHER RESPIRATORY from 10/05/2022 in [facility] for [facility], [facility]  Date 09/28/22  Educator Donnetta Simpers  [Handout]  Instruction Review Code 1- Verbalizes Understanding       Oxygen Use Group instruction provided by PowerPoint, verbal discussion, and written material to discuss how supplemental oxygen is prescribed and different types of oxygen supply systems. Resources for more information are provided.  Flowsheet Row PULMONARY REHAB OTHER RESPIRATORY from 10/05/2022 in [facility]  Date 10/05/22  Educator Donnetta Simpers  [Handout]  Instruction Review Code 1- Verbalizes Understanding       Breathing Techniques Group instruction that is supported by demonstration and  informational handouts. Instructor discusses the benefits of pursed lip and diaphragmatic breathing and detailed demonstration on how to perform both.     Risk Factor Reduction Group instruction that is supported by a PowerPoint presentation. Instructor discusses the definition of a risk factor, different risk factors for pulmonary disease, and how the heart and lungs work together. Flowsheet Row PULMONARY REHAB OTHER RESPIRATORY from 10/05/2022 in [facility] for Heart, Vascular, & [place]  Date 08/17/22  Educator EP  Instruction Review Code 1- Verbalizes Understanding       MD Day A group question and answer session with a medical doctor that allows participants to ask questions that relate to their pulmonary disease state. Flowsheet Row PULMONARY REHAB OTHER RESPIRATORY from 09/26/2018 in [facility] for Heart, Vascular, & [place]  Date 09/12/18  Educator yacoub  Instruction Review Code 1- Verbalizes Understanding       Nutrition for the Pulmonary Patient Group instruction provided by PowerPoint slides, verbal discussion, and written materials to support subject matter. The instructor gives an explanation and review of healthy diet recommendations, which includes a discussion on weight management, recommendations for fruit and vegetable consumption, as well as protein, fluid, caffeine, fiber, sodium, sugar, and alcohol. Tips for eating when patients are short of breath are discussed. Flowsheet Row PULMONARY REHAB OTHER RESPIRATORY from 09/26/2018 in [facility] for Heart, Vascular, & Lung Health  Date 07/18/18  Educator Rodman Pickle  Instruction Review Code 2- Demonstrated Understanding        Other Education Group or individual verbal, written, or video instructions that support the educational goals of the pulmonary rehab program.    Knowledge Questionnaire Score:  Knowledge Questionnaire Score -  08/11/22 1615       Knowledge Questionnaire Score   Pre Score 10/18             Core Components/Risk Factors/Patient Goals at Admission:  Personal Goals and Risk Factors at Admission - 08/11/22 1430       Core Components/Risk Factors/Patient Goals on Admission   Improve shortness of breath with ADL's Yes    Intervention Provide education, individualized exercise plan and daily activity instruction to help decrease symptoms of SOB with activities of daily living.    Expected Outcomes Short Term: Improve cardiorespiratory fitness to achieve a reduction of symptoms when performing ADLs;Long Term: Be able to perform more ADLs without symptoms or delay the onset of symptoms    Hypertension Yes    Intervention Provide education on lifestyle modifcations including regular physical activity/exercise,  weight management, moderate sodium restriction and increased consumption of fresh fruit, vegetables, and low fat dairy, alcohol moderation, and smoking cessation.;Monitor prescription use compliance.    Expected Outcomes Short Term: Continued assessment and intervention until BP is < 140/50m HG in hypertensive participants. < 130/820mHG in hypertensive participants with diabetes, heart failure or chronic kidney disease.;Long Term: Maintenance of blood pressure at goal levels.             Core Components/Risk Factors/Patient Goals Review:   Goals and Risk Factor Review     Row Name 08/16/22 0825 09/07/22 1628 10/04/22 0737         Core Components/Risk Factors/Patient Goals Review   Personal Goals Review Hypertension;Develop more efficient breathing techniques such as purse lipped breathing and diaphragmatic breathing and practicing self-pacing with activity.;Improve shortness of breath with ADL's Hypertension;Develop more efficient breathing techniques such as purse lipped breathing and diaphragmatic breathing and practicing self-pacing with activity.;Improve shortness of breath with ADL's  Hypertension;Develop more efficient breathing techniques such as purse lipped breathing and diaphragmatic breathing and practicing self-pacing with activity.;Improve shortness of breath with ADL's     Review Pt is scheduled to begin exercise this week. Will continue to assess. HaGracieannas completed 7 exercise sessions. She feels that her functional capacity has improved since starting rehab. She is very motivated to exercise. Her METs on the [place] have remained relatively the same although her track laps have increased. Her blood pressure has decreased since starting. Her last blood pressure was 114/60. HaKharias completed 13 exercise sessions. HaEilenes still improving her functional capacity. She hoped to maintain this conditioning level as she is walking at home. Her oxygen saturation has maintained around 89-95% on 4L. She hopes to get home health aid soon for some ADLs. Blood pressure has still been trending in a postive directions. Will continue to assess.     Expected Outcomes See admission goals. See admission goals. See admission goals.              Core Components/Risk Factors/Patient Goals at Discharge (Final Review):   Goals and Risk Factor Review - 10/04/22 0737       Core Components/Risk Factors/Patient Goals Review   Personal Goals Review Hypertension;Develop more efficient breathing techniques such as purse lipped breathing and diaphragmatic breathing and practicing self-pacing with activity.;Improve shortness of breath with ADL's    Review HaAnyaeas completed 13 exercise sessions. HaPatrizias still improving her functional capacity. She hoped to maintain this conditioning level as she is walking at home. Her oxygen saturation has maintained around 89-95% on 4L. She hopes to get home health aid soon for some ADLs. Blood pressure has still been trending in a postive directions. Will continue to assess.    Expected Outcomes See admission goals.             ITP Comments: Pt is  making expected progress toward Pulmonary Rehab goals after completing 15 sessions. Recommend continued exercise, life style modification, education, and utilization of breathing techniques to increase stamina and strength, while also decreasing shortness of breath with exertion.  Dr. JaRodman Pickles Medical Director for Pulmonary [facility] at [facility]

## 2022-10-17 ENCOUNTER — Encounter (HOSPITAL_COMMUNITY)
Admission: RE | Admit: 2022-10-17 | Discharge: 2022-10-17 | Disposition: A | Discharge status: Home / Self Care | Payer: Medicare Other | Source: Ambulatory Visit | Attending: Pulmonary Disease | Admitting: Pulmonary Disease

## 2022-10-17 DIAGNOSIS — J4550 Severe persistent asthma, uncomplicated: Secondary | ICD-10-CM

## 2022-10-17 NOTE — Progress Notes (Signed)
Daily Session Note  Patient Details  Name: TANITA PALINKAS MRN: [id number] Date of Birth: [birth year] Referring Provider:   [name] Pulmonary Rehab Walk Test from 08/11/2022 in [facility] for Heart, Vascular, & [place]  Referring Provider [name]       Encounter Date: 10/17/2022  Check In:  Session Check In - 10/17/22 1112       Check-In   Supervising physician immediately available to respond to emergencies [facility] - Physician supervision    Physician(s) Mannam    Location [facility]    Staff Present Elmon Else, MS, ACSM-CEP, Exercise Physiologist;Shanay Woolman Ysidro Evert, RN;Samantha [place], RD, LDN;Randi [facility], ACSM-CEP, Exercise Physiologist;Other    Virtual Visit No    Medication changes reported     No    Fall or balance concerns reported    No    Tobacco Cessation No Change    Warm-up and Cool-down Performed as group-led instruction    Resistance Training Performed Yes    VAD Patient? No    PAD/SET Patient? No      Pain Assessment   Currently in Pain? No/denies             Capillary Blood Glucose: No results found for this or any previous visit (from the past 24 hour(s)).    Social History   Tobacco Use  Smoking Status Former   Packs/day: 1.00   Years: 8.00   Total pack years: 8.00   Types: Cigarettes   Quit date: 11/21/1983   Years since quitting: 38.9  Smokeless Tobacco Never    Goals Met:  Proper associated with RPD/PD & O2 Sat Exercise tolerated well No report of concerns or symptoms today Strength training completed today  Goals Unmet:  Not Applicable  Comments: Service time is from 1009 to 1138 Discussed with Jesselle her need for continuous oxygen and not using her Inogen which is pulsed that she purchased. She does have continuous flow tanks that she obtained from [facility] that she has chosen due to that they are easier to carry. Discussed the issues with not getting enough oxygen to her vital  organs.She voices understanding.We will message her pulmonologist with her increased needs for oxygen from her previous prescription.   Dr. Rodman Pickle is Medical Director for Pulmonary Rehab at [facility].

## 2022-10-17 NOTE — Progress Notes (Deleted)
Daily Session Note  Patient Details  Name: [name] MRN: [id number] Date of Birth: 21-Jan-1941 Referring Provider:   [name] Pulmonary Rehab Walk Test from 08/11/2022 in [facility] for Heart, Vascular, & [place]  Referring Provider [name]       Encounter Date: 10/17/2022  Check In:  Session Check In - 10/17/22 1112       Check-In   Supervising physician immediately available to respond to emergencies [facility] - Physician supervision    Physician(s) Mannam    [facility]    Staff Present Elmon Else, MS, ACSM-CEP, Exercise Physiologist;Lisa Ysidro Evert, RN;Samantha [place], RD, LDN;Randi [facility], ACSM-CEP, Exercise Physiologist;Other    Virtual Visit No    Medication changes reported     No    Fall or balance concerns reported    No    Tobacco Cessation No Change    Warm-up and Cool-down Performed as group-led instruction    Resistance Training Performed Yes    VAD Patient? No    PAD/SET Patient? No      Pain Assessment   Currently in Pain? No/denies             Capillary Blood Glucose: No results found for this or any previous visit (from the past 24 hour(s)).    Social History   Tobacco Use  Smoking Status Former   Packs/day: 1.00   Years: 8.00   Total pack years: 8.00   Types: Cigarettes   Quit date: 11/21/1983   Years since quitting: 38.9  Smokeless Tobacco Never    Goals Met:  Exercise tolerated well Strength training completed today  Goals Unmet:  Not Applicable  Comments: Service time is from 1009 to [place]    Dr. Rodman Pickle is Medical Director for Pulmonary Rehab at [facility].

## 2022-10-18 ENCOUNTER — Telehealth (HOSPITAL_COMMUNITY): Payer: Self-pay

## 2022-10-18 NOTE — Telephone Encounter (Signed)
RN called pt's MD at [place], Dr. Romie Levee at [facility] to inform him of patient's increased oxygen needs and a new prescription for patient from 2L to 4L. RN left message at [phone].

## 2022-10-19 ENCOUNTER — Telehealth (HOSPITAL_COMMUNITY): Payer: Self-pay

## 2022-10-19 ENCOUNTER — Encounter (HOSPITAL_COMMUNITY)
Admission: RE | Admit: 2022-10-19 | Discharge: 2022-10-19 | Disposition: A | Discharge status: Home / Self Care | Payer: Medicare Other | Source: Ambulatory Visit | Attending: Pulmonary Disease | Admitting: Pulmonary Disease

## 2022-10-19 DIAGNOSIS — J4550 Severe persistent asthma, uncomplicated: Secondary | ICD-10-CM

## 2022-10-19 NOTE — Telephone Encounter (Signed)
Call and 2nd message for Dr. Blythe Stanford, pulm. MD at [facility], regarding increased O2 needs at pulmonary rehab. Pt now requiring 4L and needing new DME order and would like an oxymizer. Awaiting to hear back from MD.

## 2022-10-19 NOTE — Progress Notes (Signed)
Daily Session Note  Patient Details  Name: [name] MRN: [id number] Date of Birth: 11-Apr-1941 Referring Provider:   April Ward Pulmonary Rehab Walk Test from 08/11/2022 in [facility] for Heart, Vascular, & [place]  Referring Provider [name]       Encounter Date: 10/19/2022  Check In:  Session Check In - 10/19/22 1159       Check-In   Supervising physician immediately available to respond to emergencies [facility] - Physician supervision    Physician(s) [name]    [facility]    Staff Present [name], [name] Fears, MS, ACSM-CEP, Exercise Physiologist;[name], ACSM-CEP, Exercise Physiologist;Other    Virtual Visit No    Medication changes reported     No    Fall or balance concerns reported    No    Tobacco Cessation No Change    Warm-up and Cool-down Performed as group-led instruction    Resistance Training Performed Yes    VAD Patient? No    PAD/SET Patient? No      Pain Assessment   Currently in Pain? No/denies    Multiple Pain Sites No             Capillary Blood Glucose: No results found for this or any previous visit (from the past 24 hour(s)).    Social History   Tobacco Use  Smoking Status Former   Packs/day: 1.00   Years: 8.00   Total pack years: 8.00   Types: Cigarettes   Quit date: 11/21/1983   Years since quitting: 38.9  Smokeless Tobacco Never    Goals Met:  Independence with exercise equipment Exercise tolerated well No report of concerns or symptoms today Strength training completed today  Goals Unmet:    Comments: Service time is from 1005 to 1150.    [name] is Medical Director for Pulmonary Rehab at [facility].

## 2022-10-20 ENCOUNTER — Telehealth (HOSPITAL_COMMUNITY): Payer: Self-pay

## 2022-10-20 NOTE — Telephone Encounter (Signed)
Received call from [facility] pulmonology. Oxygen prx update and oxymizer request have been received. Dr. Blythe Stanford notified about both.

## 2022-10-24 ENCOUNTER — Encounter (HOSPITAL_COMMUNITY)
Admission: RE | Admit: 2022-10-24 | Discharge: 2022-10-24 | Disposition: A | Discharge status: Home / Self Care | Payer: Medicare Other | Source: Ambulatory Visit | Attending: Pulmonary Disease | Admitting: Pulmonary Disease

## 2022-10-24 VITALS — Wt 136.5 lb

## 2022-10-24 DIAGNOSIS — J4550 Severe persistent asthma, uncomplicated: Secondary | ICD-10-CM | POA: Insufficient documentation

## 2022-10-24 DIAGNOSIS — Z5189 Encounter for other specified aftercare: Secondary | ICD-10-CM | POA: Diagnosis not present

## 2022-10-24 NOTE — Progress Notes (Signed)
Daily Session Note  Patient Details  Name: [name] MRN: [id number] Date of Birth: [birth year] Referring Provider:   [name] Pulmonary Rehab Walk Test from 08/11/2022 in [facility] for Heart, Vascular, & [place]  Referring Provider [name]       Encounter Date: 10/24/2022  Check In:  Session Check In - 10/24/22 1113       Check-In   Supervising physician immediately available to respond to emergencies [facility] - Physician supervision    Physician(s) Agarwala    [facility]    Staff Present Elmon Else, MS, ACSM-CEP, Exercise Physiologist;Other;Samantha [place], RD, LDN;Randi Olen Cordial BS, ACSM-CEP, Exercise Physiologist    Virtual Visit No    Medication changes reported     No    Fall or balance concerns reported    No    Tobacco Cessation No Change    Warm-up and Cool-down Performed as group-led instruction    Resistance Training Performed Yes    VAD Patient? No    PAD/SET Patient? No      Pain Assessment   Currently in Pain? No/denies             Capillary Blood Glucose: No results found for this or any previous visit (from the past 24 hour(s)).   Exercise Prescription Changes - 10/24/22 1200       Response to Exercise   Blood Pressure (Admit) 140/66    Blood Pressure (Exercise) 150/64    Blood Pressure (Exit) 116/76    Heart Rate (Admit) 75 bpm    Heart Rate (Exercise) 74 bpm    Heart Rate (Exit) 73 bpm    Oxygen Saturation (Admit) 91 %    Oxygen Saturation (Exercise) 92 %    Oxygen Saturation (Exit) 96 %    Rating of Perceived Exertion (Exercise) 13    Perceived Dyspnea (Exercise) 3    Duration Continue with 30 min of aerobic exercise without signs/symptoms of physical distress.    Intensity THRR unchanged      Progression   Progression Continue to progress workloads to maintain intensity without signs/symptoms of physical distress.      Resistance Training   Training Prescription Yes     Weight Red Bands    Reps 10-15    Time 10 Minutes      Oxygen   Oxygen Continuous    Liters 4      NuStep   Level 5    SPM 60    Minutes 15    METs 1.7      Track   Laps 8    Minutes 15    METs 2.23      Oxygen   Maintain Oxygen Saturation 88% or higher             Social History   Tobacco Use  Smoking Status Former   Packs/day: 1.00   Years: 8.00   Total pack years: 8.00   Types: Cigarettes   Quit date: 11/21/1983   Years since quitting: 38.9  Smokeless Tobacco Never    Goals Met:  Proper associated with RPD/PD & O2 Sat Exercise tolerated well No report of concerns or symptoms today Strength training completed today  Goals Unmet:  Not Applicable  Comments: Service time is from 1010 to 1126.    Dr. Rodman Pickle is Medical Director for Pulmonary Rehab at [facility].

## 2022-10-26 ENCOUNTER — Encounter (HOSPITAL_COMMUNITY)
Admission: RE | Admit: 2022-10-26 | Discharge: 2022-10-26 | Disposition: A | Discharge status: Home / Self Care | Payer: Medicare Other | Source: Ambulatory Visit | Attending: Pulmonary Disease | Admitting: Pulmonary Disease

## 2022-10-26 DIAGNOSIS — J4550 Severe persistent asthma, uncomplicated: Secondary | ICD-10-CM | POA: Diagnosis not present

## 2022-10-26 NOTE — Progress Notes (Signed)
Daily Session Note  Patient Details  Name: IEISHA GAO MRN: [id number] Date of Birth: 1941-01-28 Referring Provider:   [name] Pulmonary Rehab Walk Test from 08/11/2022 in [facility] for Heart, Vascular, & [place]  Referring Provider [name]       Encounter Date: 10/26/2022  Check In:  Session Check In - 10/26/22 1140       Check-In   Supervising physician immediately available to respond to emergencies [facility] - Physician supervision    Physician(s) Agarwala    [facility]    Staff Present Elmon Else, MS, ACSM-CEP, Exercise Physiologist;Other;Samantha [place], RD, LDN;Immaculate Crutcher Olen Cordial BS, ACSM-CEP, Exercise Physiologist    Virtual Visit No    Medication changes reported     No    Fall or balance concerns reported    No    Tobacco Cessation No Change    Warm-up and Cool-down Performed as group-led instruction    Resistance Training Performed Yes    VAD Patient? No    PAD/SET Patient? No      Pain Assessment   Currently in Pain? No/denies    Multiple Pain Sites No             Capillary Blood Glucose: No results found for this or any previous visit (from the past 24 hour(s)).    Social History   Tobacco Use  Smoking Status Former   Packs/day: 1.00   Years: 8.00   Total pack years: 8.00   Types: Cigarettes   Quit date: 11/21/1983   Years since quitting: 38.9  Smokeless Tobacco Never    Goals Met:  Independence with exercise equipment Exercise tolerated well No report of concerns or symptoms today Strength training completed today  Goals Unmet:  Not Applicable  Comments: Service time is from 1005 to 1140.    Dr. Rodman Pickle is Medical Director for Pulmonary Rehab at [facility].

## 2022-10-29 ENCOUNTER — Other Ambulatory Visit: Payer: Self-pay | Admitting: Cardiovascular Disease

## 2022-10-31 ENCOUNTER — Encounter (HOSPITAL_COMMUNITY)
Admission: RE | Admit: 2022-10-31 | Discharge: 2022-10-31 | Disposition: A | Discharge status: Home / Self Care | Payer: Medicare Other | Source: Ambulatory Visit | Attending: Pulmonary Disease | Admitting: Pulmonary Disease

## 2022-10-31 DIAGNOSIS — J4550 Severe persistent asthma, uncomplicated: Secondary | ICD-10-CM

## 2022-10-31 NOTE — Progress Notes (Signed)
Daily Session Note  Patient Details  Name: [name] MRN: [id number] Date of Birth: 1941/07/04 Referring Provider:   [name] Pulmonary Rehab Walk Test from 08/11/2022 in [facility] for Heart, Vascular, & [place]  Referring Provider [name]       Encounter Date: 10/31/2022  Check In:  Session Check In - 10/31/22 1135       Check-In   Supervising physician immediately available to respond to emergencies [facility] - Physician supervision    Physician(s) Dr. Tacy Learn    Location [facility]    Staff Present Elmon Else, MS, ACSM-CEP, Exercise Physiologist;Lisa Ysidro Evert, RN;Randi Olen Cordial BS, ACSM-CEP, Exercise Physiologist;Other    Virtual Visit No    Medication changes reported     No    Fall or balance concerns reported    No    Tobacco Cessation No Change    Warm-up and Cool-down Performed as group-led instruction    Resistance Training Performed Yes    VAD Patient? No    PAD/SET Patient? No      Pain Assessment   Currently in Pain? No/denies    Multiple Pain Sites No             Capillary Blood Glucose: No results found for this or any previous visit (from the past 24 hour(s)).    Social History   Tobacco Use  Smoking Status Former   Packs/day: 1.00   Years: 8.00   Total pack years: 8.00   Types: Cigarettes   Quit date: 11/21/1983   Years since quitting: 38.9  Smokeless Tobacco Never    Goals Met:  Independence with exercise equipment Exercise tolerated well No report of concerns or symptoms today Strength training completed today  Goals Unmet:  Not Applicable  Comments: Service time is from 1014 to 1145    Dr. Rodman Pickle is Medical Director for Pulmonary Rehab at [facility].

## 2022-11-02 ENCOUNTER — Encounter (HOSPITAL_COMMUNITY)
Admission: RE | Admit: 2022-11-02 | Discharge: 2022-11-02 | Disposition: A | Discharge status: Home / Self Care | Payer: Medicare Other | Source: Ambulatory Visit | Attending: Pulmonary Disease | Admitting: Pulmonary Disease

## 2022-11-02 DIAGNOSIS — J4550 Severe persistent asthma, uncomplicated: Secondary | ICD-10-CM

## 2022-11-02 NOTE — Progress Notes (Signed)
Daily Session Note  Patient Details  Name: [name] MRN: [id number] Date of Birth: [birth year] Referring Provider:   [name] Pulmonary Rehab Walk Test from 08/11/2022 in [facility] for Heart, Vascular, & Lung Health  Referring Provider [name]       Encounter Date: 11/02/2022  Check In:  Session Check In - 11/02/22 1133       Check-In   Supervising physician immediately available to respond to emergencies [facility] - Physician supervision    Physician(s) Dr. Tacy Learn    Location [facility]    Staff Present [name], RN;Randi Yevonne Pax, ACSM-CEP, Exercise Physiologist;Kaylee Rosana Hoes, MS, ACSM-CEP, Exercise Physiologist;Other    Virtual Visit No    Medication changes reported     No    Fall or balance concerns reported    No    Tobacco Cessation No Change    Warm-up and Cool-down Performed as group-led instruction    Resistance Training Performed Yes    VAD Patient? No    PAD/SET Patient? No      Pain Assessment   Currently in Pain? No/denies             Capillary Blood Glucose: No results found for this or any previous visit (from the past 24 hour(s)).    Social History   Tobacco Use  Smoking Status Former   Packs/day: 1.00   Years: 8.00   Total pack years: 8.00   Types: Cigarettes   Quit date: 11/21/1983   Years since quitting: 38.9  Smokeless Tobacco Never    Goals Met:  Proper associated with RPD/PD & O2 Sat No report of concerns or symptoms today Strength training completed today  Goals Unmet:  Not Applicable  Comments: Service time is from 1011 to [place]    Dr. Rodman Pickle is Medical Director for Pulmonary Rehab at [facility]

## 2022-11-07 ENCOUNTER — Encounter (HOSPITAL_COMMUNITY): Payer: Self-pay

## 2022-11-07 ENCOUNTER — Encounter (HOSPITAL_COMMUNITY)
Admission: RE | Admit: 2022-11-07 | Discharge: 2022-11-07 | Disposition: A | Discharge status: Home / Self Care | Payer: Medicare Other | Source: Ambulatory Visit | Attending: Pulmonary Disease | Admitting: Pulmonary Disease

## 2022-11-07 VITALS — Wt 136.5 lb

## 2022-11-07 DIAGNOSIS — J4550 Severe persistent asthma, uncomplicated: Secondary | ICD-10-CM

## 2022-11-07 NOTE — Progress Notes (Signed)
Daily Session Note  Patient Details  Name: AMIRIA ORRISON MRN: [id number] Date of Birth: 05/18/1941 Referring Provider:   [name] Pulmonary Rehab Walk Test from 08/11/2022 in [facility] for Heart, Vascular, & Lung Health  Referring Provider [name]       Encounter Date: 11/07/2022  Check In:  Session Check In - 11/07/22 1133       Check-In   Supervising physician immediately available to respond to emergencies [facility] - Physician supervision    Physician(s) Dr. Ander Slade    Location [facility]    Staff Present Elmon Else, MS, ACSM-CEP, Exercise Physiologist;Sherrise Liberto Yevonne Pax, ACSM-CEP, Exercise Physiologist;Other    Virtual Visit No    Medication changes reported     No    Fall or balance concerns reported    No    Tobacco Cessation No Change    Warm-up and Cool-down Performed as group-led instruction    Resistance Training Performed Yes    VAD Patient? No    PAD/SET Patient? No      Pain Assessment   Currently in Pain? No/denies             Capillary Blood Glucose: No results found for this or any previous visit (from the past 24 hour(s)).   Exercise Prescription Changes - 11/07/22 1200       Response to Exercise   Blood Pressure (Admit) 114/60    Blood Pressure (Exercise) 120/66    Blood Pressure (Exit) 114/76    Heart Rate (Admit) 81 bpm    Heart Rate (Exercise) 78 bpm    Heart Rate (Exit) 73 bpm    Oxygen Saturation (Admit) 93 %    Oxygen Saturation (Exercise) 89 %    Oxygen Saturation (Exit) 96 %    Rating of Perceived Exertion (Exercise) 13    Perceived Dyspnea (Exercise) 2    Duration Continue with 30 min of aerobic exercise without signs/symptoms of physical distress.    Intensity THRR unchanged      Progression   Progression Continue to progress workloads to maintain intensity without signs/symptoms of physical distress.      Resistance Training   Training Prescription Yes    Weight Red Bands     Reps 10-15    Time 10 Minutes      Oxygen   Oxygen Continuous    Liters 4      NuStep   Level 5    SPM 60    Minutes 15    METs 1.7      Track   Laps 9    Minutes 15    METs 2.38      Oxygen   Maintain Oxygen Saturation 88% or higher             Social History   Tobacco Use  Smoking Status Former   Packs/day: 1.00   Years: 8.00   Total pack years: 8.00   Types: Cigarettes   Quit date: 11/21/1983   Years since quitting: 38.9  Smokeless Tobacco Never    Goals Met:  Exercise tolerated well No report of concerns or symptoms today Strength training completed today  Goals Unmet:  Not Applicable  Comments: Service time is from 1011 to 1142.    Dr. Rodman Pickle is Medical Director for Pulmonary Rehab at [facility].

## 2022-11-08 NOTE — Progress Notes (Signed)
Pulmonary Individual Treatment Plan  Patient Details  Name: [name] MRN: [id number] Date of Birth: 07-24-1941 Referring Provider:   [name] Pulmonary Rehab Walk Test from 08/11/2022 in [facility]  Referring Provider [name]       Initial Encounter Date:  Flowsheet Row Pulmonary Rehab Walk Test from 08/11/2022 in [facility]  Date 08/11/22       Visit Diagnosis: Severe persistent asthma without complication  Patient's Home Medications on Admission:   Current Outpatient Medications:    acetaminophen (TYLENOL) 650 MG CR tablet, Take 650 mg by mouth every morning., Disp: , Rfl:    albuterol (PROVENTIL) (2.5 MG/3ML) 0.083% nebulizer solution, Take 2.5 mg by nebulization every 6 (six) hours as needed for wheezing or shortness of breath., Disp: , Rfl:    albuterol (VENTOLIN HFA) 108 (90 Base) MCG/ACT inhaler, Inhale 1-2 puffs into the lungs every 6 (six) hours as needed for wheezing or shortness of breath., Disp: , Rfl:    amLODipine (NORVASC) 10 MG tablet, Take 10 mg by mouth daily. , Disp: , Rfl:    aspirin EC 81 MG tablet, Take 81 mg by mouth daily., Disp: , Rfl:    Azelastine HCl 0.15 % SOLN, Place 1 spray into both nostrils daily as needed for allergies., Disp: , Rfl:    budesonide (PULMICORT) 0.25 MG/2ML nebulizer solution, Inhale 0.25 mg into the lungs as needed., Disp: , Rfl:    calcitRIOL (ROCALTROL) 0.25 MCG capsule, Take 0.25 mcg by mouth daily. , Disp: , Rfl:    ezetimibe (ZETIA) 10 MG tablet, TAKE 1 TABLET BY MOUTH EVERY DAY, Disp: 90 tablet, Rfl: 1   fluticasone (FLONASE) 50 MCG/ACT nasal spray, Place into both nostrils., Disp: , Rfl:    fluticasone furoate-vilanterol (BREO ELLIPTA) 200-25 MCG/INH AEPB, Inhale 1 puff into the lungs daily as needed (respiratory issues.). , Disp: , Rfl:    glucose blood (ONETOUCH VERIO) test strip, 1 each by Other  route daily in the afternoon. Use as instructed, Disp: 100 each, Rfl: 3   isosorbide mononitrate (IMDUR) 30 MG 24 hr tablet, TAKE 1 TABLET BY MOUTH EVERY DAY, Disp: 90 tablet, Rfl: 2   KLOR-CON M20 20 MEQ tablet, TAKE 1 TABLET BY MOUTH EVERY DAY, Disp: 90 tablet, Rfl: 2   metoprolol tartrate (LOPRESSOR) 50 MG tablet, TAKE 1 TABLET BY MOUTH TWICE A DAY, Disp: 180 tablet, Rfl: 3   nitroGLYCERIN (NITROSTAT) 0.4 MG SL tablet, Place 1 tablet (0.4 mg total) under the tongue every 5 (five) minutes as needed for chest pain., Disp: 25 tablet, Rfl: 1   omeprazole (PRILOSEC) 20 MG capsule, Take 20 mg by mouth daily after breakfast., Disp: , Rfl:    predniSONE (DELTASONE) 5 MG tablet, Take 5 mg by mouth 2 (two) times daily., Disp: , Rfl:    rosuvastatin (CRESTOR) 40 MG tablet, Take 1 tablet (40 mg total) by mouth daily. Pt needs to make appt with provider for further refills - 2nd attempt, Disp: 15 tablet, Rfl: 0   sennosides-docusate sodium (SENOKOT-S) 8.6-50 MG tablet, Take 1 tablet by mouth daily as needed (constipation.). , Disp: , Rfl:    tacrolimus (PROGRAF) 1 MG capsule, Take 5 mg by mouth See admin instructions. 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, hold ASA 2-3 weeks - consider plavix instead of ASA   Asthma    Cellulitis 12/19/2019   right upper arm   Coronary artery disease    cath 11/2007, grafts patent /  Nuclear, June, 2011, prior inferior MI with mild peri-infarct ischemia, anterior breast attenuation, EF 67%, done for renal transplant assessment / Low level exercise/Lexiscan Myoview (11/2013): EF 67%, no ischemi; normal study   Diabetes mellitus    type 2   ESRD on dialysis [facility])    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, 2012   Family history of  breast cancer    Family history of prostate cancer    GERD (gastroesophageal reflux disease)    GI bleed 11/26/2010   January, 2012 , AVM   Gout    History of methicillin resistant staphylococcus aureus (MRSA)    Hyperlipidemia    Hyperparathyroidism    Hypertension    Myocardial infarction [facility])    Osteoporosis    Pneumonia 08/2010    Hospitalization, October, 2011   Primary osteoarthritis, 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   S/P kidney transplant    September, 2012, [facility]   Subacromial impingement of 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    Tobacco Use: Social History   Tobacco Use  Smoking Status Former   Packs/day: 1.00   Years: 8.00   Total pack years: 8.00   Types: Cigarettes   Quit date: 11/21/1983   Years since quitting: 38.9  Smokeless Tobacco Never    Labs: Review Flowsheet  More data exists      Latest Ref Rng & Units 10/17/2021 11/03/2021 12/27/2021 02/22/2022 07/10/2022  Labs for ITP Cardiac and Pulmonary Rehab  Cholestrol 100 - 199 mg/dL 138  - - - -  LDL (calc) 0 - 99 mg/dL 57  - - - -  HDL-C >39 mg/dL 65  - - - -  Trlycerides 0 - 149 mg/dL 82  - - - -  Hemoglobin A1c 4.0 - 5.6 % - 6.5  - 6.1  6.4   TCO2 22 - 32 mmol/L - - 24  - -    Capillary Blood Glucose: Lab Results  Component Value Date   GLUCAP 115 (H) 08/17/2022   GLUCAP 96 08/17/2022   GLUCAP 109 (H) 08/11/2022   GLUCAP 91 12/27/2021   GLUCAP 153 (H) 02/21/2018    POCT Glucose     Row Name 08/22/22 1220             POCT Blood Glucose   Pre-Exercise 104 mg/dL       Post-Exercise 130 mg/dL                Pulmonary Assessment Scores:  Pulmonary Assessment Scores     Row Name 08/11/22 1437 08/11/22 1613 11/08/22 0856     ADL UCSD   ADL Phase -- -- Exit   SOB Score total -- 90 100     CAT Score   CAT Score -- 25 30     mMRC Score   mMRC Score 4 4 --           UCSD: Self-administered rating of dyspnea associated with activities of daily living (ADLs) 6-point scale (0 = "not at all" to 5  = "maximal or unable to do because of breathlessness")  Scoring Scores range from 0 to 120.  Minimally  important difference is 5 units  CAT: CAT can identify the health impairment of COPD patients and is better correlated with disease progression.  CAT has a scoring range of zero to 40. The CAT score is classified into four groups of low (less than 10), medium (10 - 20), high (21-30) and very high (31-40) based on the impact level of disease on health status. A CAT score over 10 suggests significant symptoms.  A worsening CAT score could be explained by an exacerbation, poor medication adherence, poor inhaler technique, or progression of COPD or comorbid conditions.  CAT MCID is 2 points  mMRC: mMRC (Modified Medical Research Council) Dyspnea Scale is used to assess the degree of baseline functional disability in patients of respiratory disease due to dyspnea. No minimal important difference is established. A decrease in score of 1 point or greater is considered a positive change.   Pulmonary Function Assessment:   Exercise Target Goals: Exercise Program Goal: Individual exercise prescription set using results from initial 6 min walk test and THRR while considering  patient's activity barriers and safety.   Exercise Prescription Goal: Initial exercise prescription builds to 30-45 minutes a day of aerobic activity, 2-3 days per week.  Home exercise guidelines will be given to patient during program as part of exercise prescription that the participant will acknowledge.  Activity Barriers & Risk Stratification:  Activity Barriers & Cardiac Risk Stratification - 08/11/22 1434       Activity Barriers & Cardiac Risk Stratification   Activity Barriers Left Knee Replacement;Shortness of Breath;Left Hip Replacement;Muscular Weakness;Deconditioning;Balance Concerns    Cardiac Risk Stratification High             6 Minute Walk:  6 Minute Walk     Row Name 08/11/22 1619         6  Minute Walk   Phase Initial     Distance 562 feet     Walk Time 6 minutes     # of Rest Breaks 1     MPH 1.06     METS 2.2     RPE 12     Perceived Dyspnea  2     VO2 Peak 7.69     Symptoms Yes (comment)     Comments SOB and fatigue, standing rest 2:00 min to 2:23     Resting HR 85 bpm     Resting BP 140/60     Resting Oxygen Saturation  92 %     Exercise Oxygen Saturation  during 6 min walk 90 %     Max Ex. HR 88 bpm     Max Ex. BP 144/62     2 Minute Post BP 138/66       Interval HR   1 Minute HR 88     2 Minute HR 87     3 Minute HR 85     4 Minute HR 86     5 Minute HR 85     6 Minute HR 88     2 Minute Post HR 68     Interval Heart Rate? Yes       Interval Oxygen   Interval Oxygen? Yes     Baseline Oxygen Saturation % 92 %     1 Minute Oxygen Saturation % 96 %     1 Minute Liters of Oxygen 2 L     2 Minute Oxygen Saturation % 94 %     2 Minute Liters of Oxygen 0 L  3 Minute Oxygen Saturation % 92 %     3 Minute Liters of Oxygen 2 L     4 Minute Oxygen Saturation % 94 %     4 Minute Liters of Oxygen 2 L     5 Minute Oxygen Saturation % 90 %     5 Minute Liters of Oxygen 2 L     6 Minute Oxygen Saturation % 92 %     6 Minute Liters of Oxygen 2 L     2 Minute Post Oxygen Saturation % 91 %     2 Minute Post Liters of Oxygen 2 L              Oxygen Initial Assessment:  Oxygen Initial Assessment - 08/11/22 1431       Home Oxygen   Home Oxygen Device Home Concentrator    Sleep Oxygen Prescription Continuous    Liters per minute 2    Home Exercise Oxygen Prescription Continuous    Liters per minute 2    Home Resting Oxygen Prescription Continuous    Liters per minute 2    Compliance with Home Oxygen Use Yes      Initial 6 min Walk   Oxygen Used Continuous    Liters per minute 2      Program Oxygen Prescription   Program Oxygen Prescription Continuous    Liters per minute 2      Intervention   Short Term Goals To learn and exhibit compliance  with exercise, home and travel O2 prescription;To learn and understand importance of monitoring SPO2 with pulse oximeter and demonstrate accurate use of the pulse oximeter.;To learn and understand importance of maintaining oxygen saturations>88%;To learn and demonstrate proper pursed lip breathing techniques or other breathing techniques. ;To learn and demonstrate proper use of respiratory medications    Long  Term Goals Demonstrates proper use of MDI's;Compliance with respiratory medication;Exhibits proper breathing techniques, such as pursed lip breathing or other method taught during program session;Maintenance of O2 saturations>88%;Verbalizes importance of monitoring SPO2 with pulse oximeter and return demonstration;Exhibits compliance with exercise, home  and travel O2 prescription             Oxygen Re-Evaluation:  Oxygen Re-Evaluation     Row Name 08/14/22 0749 09/04/22 1148 10/04/22 0734 11/01/22 0812       Program Oxygen Prescription   Program Oxygen Prescription Continuous Continuous Continuous Continuous    Liters per minute _0 Comments -- O2 increase for track walking. O2 sat is 91% on 4L. -- --      [place]  Currently working on updating O2 prx    Sleep Oxygen Prescription Continuous Continuous Continuous Continuous    Liters per minute _1 Home Exercise Oxygen Prescription Continuous Continuous Continuous Continuous    Liters per minute _2 Home Resting Oxygen Prescription Continuous Continuous Continuous Continuous    Liters per minute _3 Compliance with Home Oxygen Use Yes Yes Yes Yes      Goals/Expected Outcomes   Short Term Goals To learn and exhibit compliance with exercise, home and travel O2 prescription;To learn and understand importance of monitoring SPO2 with pulse oximeter and demonstrate accurate use of the pulse oximeter.;To learn and  understand importance of maintaining oxygen saturations>88%;To learn and demonstrate proper pursed lip breathing  techniques or other breathing techniques. ;To learn and demonstrate proper use of respiratory medications To learn and exhibit compliance with exercise, home and travel O2 prescription;To learn and understand importance of monitoring SPO2 with pulse oximeter and demonstrate accurate use of the pulse oximeter.;To learn and understand importance of maintaining oxygen saturations>88%;To learn and demonstrate proper pursed lip breathing techniques or other breathing techniques. ;To learn and demonstrate proper use of respiratory medications To learn and exhibit compliance with exercise, home and travel O2 prescription;To learn and understand importance of monitoring SPO2 with pulse oximeter and demonstrate accurate use of the pulse oximeter.;To learn and understand importance of maintaining oxygen saturations>88%;To learn and demonstrate proper pursed lip breathing techniques or other breathing techniques. ;To learn and demonstrate proper use of respiratory medications To learn and exhibit compliance with exercise, home and travel O2 prescription;To learn and understand importance of monitoring SPO2 with pulse oximeter and demonstrate accurate use of the pulse oximeter.;To learn and understand importance of maintaining oxygen saturations>88%;To learn and demonstrate proper pursed lip breathing techniques or other breathing techniques. ;To learn and demonstrate proper use of respiratory medications    Long  Term Goals Demonstrates proper use of MDI's;Compliance with respiratory medication;Exhibits proper breathing techniques, such as pursed lip breathing or other method taught during program session;Maintenance of O2 saturations>88%;Verbalizes importance of monitoring SPO2 with pulse oximeter and return demonstration;Exhibits compliance with exercise, home  and travel O2 prescription Demonstrates proper use  of MDI's;Compliance with respiratory medication;Exhibits proper breathing techniques, such as pursed lip breathing or other method taught during program session;Maintenance of O2 saturations>88%;Verbalizes importance of monitoring SPO2 with pulse oximeter and return demonstration;Exhibits compliance with exercise, home  and travel O2 prescription Demonstrates proper use of MDI's;Compliance with respiratory medication;Exhibits proper breathing techniques, such as pursed lip breathing or other method taught during program session;Maintenance of O2 saturations>88%;Verbalizes importance of monitoring SPO2 with pulse oximeter and return demonstration;Exhibits compliance with exercise, home  and travel O2 prescription Demonstrates proper use of MDI's;Compliance with respiratory medication;Exhibits proper breathing techniques, such as pursed lip breathing or other method taught during program session;Maintenance of O2 saturations>88%;Verbalizes importance of monitoring SPO2 with pulse oximeter and return demonstration;Exhibits compliance with exercise, home  and travel O2 prescription    Goals/Expected Outcomes Compliance and understanding of oxygen saturation monitoring and breathing techniques to decrease shortness of breath. Compliance and understanding of oxygen saturation monitoring and breathing techniques to decrease shortness of breath. Compliance and understanding of oxygen saturation monitoring and breathing techniques to decrease shortness of breath. Compliance and understanding of oxygen saturation monitoring and breathing techniques to decrease shortness of breath.             Oxygen Discharge (Final Oxygen Re-Evaluation):  Oxygen Re-Evaluation - 11/01/22 0812       Program Oxygen Prescription   Program Oxygen Prescription Continuous    Liters per minute 4      Home Oxygen   Home Oxygen Device Home Concentrator   Currently working on updating O2 prx   Sleep Oxygen Prescription Continuous     Liters per minute 2    Home Exercise Oxygen Prescription Continuous    Liters per minute 2    Home Resting Oxygen Prescription Continuous    Liters per minute 2    Compliance with Home Oxygen Use Yes      Goals/Expected Outcomes   Short Term Goals To learn and exhibit compliance with exercise, home and travel O2 prescription;To learn and understand importance of monitoring SPO2 with pulse oximeter and demonstrate  accurate use of the pulse oximeter.;To learn and understand importance of maintaining oxygen saturations>88%;To learn and demonstrate proper pursed lip breathing techniques or other breathing techniques. ;To learn and demonstrate proper use of respiratory medications    Long  Term Goals Demonstrates proper use of MDI's;Compliance with respiratory medication;Exhibits proper breathing techniques, such as pursed lip breathing or other method taught during program session;Maintenance of O2 saturations>88%;Verbalizes importance of monitoring SPO2 with pulse oximeter and return demonstration;Exhibits compliance with exercise, home  and travel O2 prescription    Goals/Expected Outcomes Compliance and understanding of oxygen saturation monitoring and breathing techniques to decrease shortness of breath.             Initial Exercise Prescription:  Initial Exercise Prescription - 08/11/22 1600       Date of Initial Exercise RX and Referring Provider   Date 08/11/22    Referring Provider [name]    Expected Discharge Date 10/19/22      Oxygen   Oxygen Continuous    Liters 2    Maintain Oxygen Saturation 88% or higher      NuStep   Level 1    SPM 60    Minutes 15      Track   Minutes 15    METs 2      Prescription Details   Frequency (times per week) 2    Duration Progress to 30 minutes of continuous aerobic without signs/symptoms of physical distress      Intensity   THRR 40-80% of Max Heartrate 56-113    Ratings of Perceived Exertion 11-13    Perceived Dyspnea 0-4       Progression   Progression Continue progressive overload as per policy without signs/symptoms or physical distress.      Resistance Training   Training Prescription Yes    Weight red bands    Reps 10-15             Perform Capillary Blood Glucose checks as needed.  Exercise Prescription Changes:   Exercise Prescription Changes     Row Name 08/22/22 1200 09/05/22 1200 09/14/22 1200 09/26/22 1200 10/10/22 1600     Response to Exercise   Blood Pressure (Admit) 114/64 108/60 142/60 114/58 144/68   Blood Pressure (Exercise) 168/80 134/78 142/70 140/70 160/64   Blood Pressure (Exit) 136/58 102/66 122/64 106/56 130/82   Heart Rate (Admit) 83 bpm 77 bpm 63 bpm 72 bpm 73 bpm   Heart Rate (Exercise) 92 bpm 82 bpm 70 bpm 85 bpm 84 bpm   Heart Rate (Exit) 90 bpm 76 bpm 79 bpm 67 bpm 82 bpm   Oxygen Saturation (Admit) 93 % 94 % 85 % 94 % 98 %   Oxygen Saturation (Exercise) 88 % 90 % 85 % 89 % 90 %   Oxygen Saturation (Exit) 90 % 94 % 92 % 97 % 94 %   Rating of Perceived Exertion (Exercise) _0 Perceived Dyspnea (Exercise) _1 Duration Progress to 30 minutes of  aerobic without signs/symptoms of physical distress Progress to 30 minutes of  aerobic without signs/symptoms of physical distress Continue with 30 min of aerobic exercise without signs/symptoms of physical distress. Continue with 30 min of aerobic exercise without signs/symptoms of physical distress. Continue with 30 min of aerobic exercise without signs/symptoms of physical distress.   Intensity Other (comment)  40-80% of HRR THRR unchanged THRR unchanged THRR unchanged THRR unchanged     Progression  Progression Continue to progress workloads to maintain intensity without signs/symptoms of physical distress. Continue to progress workloads to maintain intensity without signs/symptoms of physical distress. Continue to progress workloads to maintain intensity without signs/symptoms of physical distress.  Continue to progress workloads to maintain intensity without signs/symptoms of physical distress. Continue to progress workloads to maintain intensity without signs/symptoms of physical distress.     Resistance Training   Training Prescription _0    Weight _1    Reps 10-15 10-15 10-15 10-15 10-15   Time -- 10 Minutes 10 Minutes 10 Minutes 10 Minutes     Interval Training   Interval Training -- -- No -- --     Oxygen   Oxygen _2    Liters 2 3-4 3-4 3-4 4     Recumbant Bike   Level -- -- -- -- 2   Minutes -- -- -- -- 10   METs -- -- -- -- --  Didn't get METS, d/t pt c/o leg pain     NuStep   Level _3 SPM 60 60 60 60 60   Minutes _4 METs 1.7 1.7 1.6 1.7 20.2     Track   Laps _5 --   Minutes _6 --   METs -- -- 1.93 -- --     Oxygen   Maintain Oxygen Saturation -- -- -- -- 88% or higher    Row Name 10/24/22 1200 11/07/22 1200           Response to Exercise   Blood Pressure (Admit) 140/66 114/60      Blood Pressure (Exercise) 150/64 120/66      Blood Pressure (Exit) 116/76 114/76      Heart Rate (Admit) 75 bpm 81 bpm      Heart Rate (Exercise) 74 bpm 78 bpm      Heart Rate (Exit) 73 bpm 73 bpm      Oxygen Saturation (Admit) 91 % 93 %      Oxygen Saturation (Exercise) 92 % 89 %      Oxygen Saturation (Exit) 96 % 96 %      Rating of Perceived Exertion (Exercise) 13 13      Perceived Dyspnea (Exercise) 3 2      Duration Continue with 30 min of aerobic exercise without signs/symptoms of physical distress. Continue with 30 min of aerobic exercise without signs/symptoms of physical distress.      Intensity THRR unchanged THRR unchanged        Progression   Progression Continue to progress workloads to maintain intensity without signs/symptoms of physical distress. Continue to progress workloads to maintain intensity without  signs/symptoms of physical distress.        Resistance Training   Training Prescription Yes Yes      Weight Red Bands Red Bands      Reps 10-15 10-15      Time 10 Minutes 10 Minutes        Oxygen   Oxygen Continuous Continuous      Liters 4 4        NuStep   Level 5 5      SPM 60 60      Minutes 15 15      METs 1.7 1.7        Track   Laps 8  9      Minutes 15 15      METs 2.23 2.38        Oxygen   Maintain Oxygen Saturation 88% or higher 88% or higher               Exercise Comments:   Exercise Comments     Row Name 08/17/22 1214 10/05/22 1537         Exercise Comments Pt completed first day of exercise. She exercised for 15 min on the Nust[place] and track. [name] averaged 1.5 METs at level 1 on the Nustep and 1.5 METs on the track. [name] performed the warmup and cooldown standing holding onto a chair with verbal cues. Discussed METs and how to increase METs. Completed home exercise plan. [name] is currently exercising at home. [name] is walking 2-3 non-rehab days/wk for 20-30 min/day. I encouraged her to exercise 3 non-rehab days/wk for 30 min/day. She agreed with my recommendations. [name] is very motivated to exercise and improve her functional capacity. She feels that she had benefited from rehab.               Exercise Goals and Review:   Exercise Goals     Row Name 08/11/22 1435 08/14/22 0748 09/04/22 1145 10/04/22 0728 11/01/22 0807     Exercise Goals   Increase Physical Activity _0    Intervention Provide advice, education, support and counseling about physical activity/exercise needs.;Develop an individualized exercise prescription for aerobic and resistive training based on initial evaluation findings, risk stratification, comorbidities and participant's personal goals. Provide advice, education, support and counseling about physical activity/exercise needs.;Develop an individualized exercise prescription for aerobic and resistive training  based on initial evaluation findings, risk stratification, comorbidities and participant's personal goals. Provide advice, education, support and counseling about physical activity/exercise needs.;Develop an individualized exercise prescription for aerobic and resistive training based on initial evaluation findings, risk stratification, comorbidities and participant's personal goals. Provide advice, education, support and counseling about physical activity/exercise needs.;Develop an individualized exercise prescription for aerobic and resistive training based on initial evaluation findings, risk stratification, comorbidities and participant's personal goals. Provide advice, education, support and counseling about physical activity/exercise needs.;Develop an individualized exercise prescription for aerobic and resistive training based on initial evaluation findings, risk stratification, comorbidities and participant's personal goals.   Expected Outcomes Short Term: Attend rehab on a regular basis to increase amount of physical activity.;Long Term: Add in home exercise to make exercise part of routine and to increase amount of physical activity.;Long Term: Exercising regularly at least 3-5 days a week. Short Term: Attend rehab on a regular basis to increase amount of physical activity.;Long Term: Add in home exercise to make exercise part of routine and to increase amount of physical activity.;Long Term: Exercising regularly at least 3-5 days a week. Short Term: Attend rehab on a regular basis to increase amount of physical activity.;Long Term: Add in home exercise to make exercise part of routine and to increase amount of physical activity.;Long Term: Exercising regularly at least 3-5 days a week. Short Term: Attend rehab on a regular basis to increase amount of physical activity.;Long Term: Add in home exercise to make exercise part of routine and to increase amount of physical activity.;Long Term: Exercising  regularly at least 3-5 days a week. Short Term: Attend rehab on a regular basis to increase amount of physical activity.;Long Term: Add in home exercise to make exercise part of routine and to increase amount of physical activity.;Long Term: Exercising regularly  at least 3-5 days a week.   Increase Strength and Stamina _0    Intervention Provide advice, education, support and counseling about physical activity/exercise needs.;Develop an individualized exercise prescription for aerobic and resistive training based on initial evaluation findings, risk stratification, comorbidities and participant's personal goals. Provide advice, education, support and counseling about physical activity/exercise needs.;Develop an individualized exercise prescription for aerobic and resistive training based on initial evaluation findings, risk stratification, comorbidities and participant's personal goals. Provide advice, education, support and counseling about physical activity/exercise needs.;Develop an individualized exercise prescription for aerobic and resistive training based on initial evaluation findings, risk stratification, comorbidities and participant's personal goals. Provide advice, education, support and counseling about physical activity/exercise needs.;Develop an individualized exercise prescription for aerobic and resistive training based on initial evaluation findings, risk stratification, comorbidities and participant's personal goals. Provide advice, education, support and counseling about physical activity/exercise needs.;Develop an individualized exercise prescription for aerobic and resistive training based on initial evaluation findings, risk stratification, comorbidities and participant's personal goals.   Expected Outcomes Short Term: Increase workloads from initial exercise prescription for resistance, speed, and METs.;Short Term: Perform resistance training exercises routinely during  rehab and add in resistance training at home;Long Term: Improve cardiorespiratory fitness, muscular endurance and strength as measured by increased METs and functional capacity (6MWT) Short Term: Increase workloads from initial exercise prescription for resistance, speed, and METs.;Short Term: Perform resistance training exercises routinely during rehab and add in resistance training at home;Long Term: Improve cardiorespiratory fitness, muscular endurance and strength as measured by increased METs and functional capacity (6MWT) Short Term: Increase workloads from initial exercise prescription for resistance, speed, and METs.;Short Term: Perform resistance training exercises routinely during rehab and add in resistance training at home;Long Term: Improve cardiorespiratory fitness, muscular endurance and strength as measured by increased METs and functional capacity (6MWT) Short Term: Increase workloads from initial exercise prescription for resistance, speed, and METs.;Short Term: Perform resistance training exercises routinely during rehab and add in resistance training at home;Long Term: Improve cardiorespiratory fitness, muscular endurance and strength as measured by increased METs and functional capacity (6MWT) Short Term: Increase workloads from initial exercise prescription for resistance, speed, and METs.;Short Term: Perform resistance training exercises routinely during rehab and add in resistance training at home;Long Term: Improve cardiorespiratory fitness, muscular endurance and strength as measured by increased METs and functional capacity (6MWT)   Able to understand and use rate of perceived exertion (RPE) scale _1    Intervention Provide education and explanation on how to use RPE scale Provide education and explanation on how to use RPE scale Provide education and explanation on how to use RPE scale Provide education and explanation on how to use RPE scale Provide education and  explanation on how to use RPE scale   Expected Outcomes Short Term: Able to use RPE daily in rehab to express subjective intensity level;Long Term:  Able to use RPE to guide intensity level when exercising independently Short Term: Able to use RPE daily in rehab to express subjective intensity level;Long Term:  Able to use RPE to guide intensity level when exercising independently Short Term: Able to use RPE daily in rehab to express subjective intensity level;Long Term:  Able to use RPE to guide intensity level when exercising independently Short Term: Able to use RPE daily in rehab to express subjective intensity level;Long Term:  Able to use RPE to guide intensity level when exercising independently Short Term: Able to use RPE daily in rehab to express  subjective intensity level;Long Term:  Able to use RPE to guide intensity level when exercising independently   Able to understand and use Dyspnea scale _0    Intervention Provide education and explanation on how to use Dyspnea scale Provide education and explanation on how to use Dyspnea scale Provide education and explanation on how to use Dyspnea scale Provide education and explanation on how to use Dyspnea scale Provide education and explanation on how to use Dyspnea scale   Expected Outcomes Short Term: Able to use Dyspnea scale daily in rehab to express subjective sense of shortness of breath during exertion;Long Term: Able to use Dyspnea scale to guide intensity level when exercising independently Short Term: Able to use Dyspnea scale daily in rehab to express subjective sense of shortness of breath during exertion;Long Term: Able to use Dyspnea scale to guide intensity level when exercising independently Short Term: Able to use Dyspnea scale daily in rehab to express subjective sense of shortness of breath during exertion;Long Term: Able to use Dyspnea scale to guide intensity level when exercising independently Short Term: Able to use  Dyspnea scale daily in rehab to express subjective sense of shortness of breath during exertion;Long Term: Able to use Dyspnea scale to guide intensity level when exercising independently Short Term: Able to use Dyspnea scale daily in rehab to express subjective sense of shortness of breath during exertion;Long Term: Able to use Dyspnea scale to guide intensity level when exercising independently   Knowledge and understanding of Target Heart Rate Range (THRR) _1    Intervention Provide education and explanation of THRR including how the numbers were predicted and where they are located for reference Provide education and explanation of THRR including how the numbers were predicted and where they are located for reference Provide education and explanation of THRR including how the numbers were predicted and where they are located for reference Provide education and explanation of THRR including how the numbers were predicted and where they are located for reference Provide education and explanation of THRR including how the numbers were predicted and where they are located for reference   Expected Outcomes Short Term: Able to state/look up THRR;Long Term: Able to use THRR to govern intensity when exercising independently;Short Term: Able to use daily as guideline for intensity in rehab Short Term: Able to state/look up THRR;Long Term: Able to use THRR to govern intensity when exercising independently;Short Term: Able to use daily as guideline for intensity in rehab Short Term: Able to state/look up THRR;Long Term: Able to use THRR to govern intensity when exercising independently;Short Term: Able to use daily as guideline for intensity in rehab Short Term: Able to state/look up THRR;Long Term: Able to use THRR to govern intensity when exercising independently;Short Term: Able to use daily as guideline for intensity in rehab Short Term: Able to state/look up THRR;Long Term: Able to use THRR to  govern intensity when exercising independently;Short Term: Able to use daily as guideline for intensity in rehab   Understanding of Exercise Prescription _2    Intervention Provide education, explanation, and written materials on patient's individual exercise prescription Provide education, explanation, and written materials on patient's individual exercise prescription Provide education, explanation, and written materials on patient's individual exercise prescription Provide education, explanation, and written materials on patient's individual exercise prescription Provide education, explanation, and written materials on patient's individual exercise prescription   Expected Outcomes Short Term: Able to explain program exercise prescription;Long Term: Able  to explain home exercise prescription to exercise independently Short Term: Able to explain program exercise prescription;Long Term: Able to explain home exercise prescription to exercise independently Short Term: Able to explain program exercise prescription;Long Term: Able to explain home exercise prescription to exercise independently Short Term: Able to explain program exercise prescription;Long Term: Able to explain home exercise prescription to exercise independently Short Term: Able to explain program exercise prescription;Long Term: Able to explain home exercise prescription to exercise independently            Exercise Goals Re-Evaluation :  Exercise Goals Re-Evaluation     Row Name 08/14/22 0752 09/04/22 1145 10/04/22 0728 11/01/22 0807       Exercise Goal Re-Evaluation   Exercise Goals Review Increase Physical Activity;Increase Strength and Stamina;Able to understand and use rate of perceived exertion (RPE) scale;Able to understand and use Dyspnea scale;Knowledge and understanding of Target Heart Rate Range (THRR);Understanding of Exercise Prescription Increase Physical Activity;Increase Strength and Stamina;Able to  understand and use rate of perceived exertion (RPE) scale;Able to understand and use Dyspnea scale;Knowledge and understanding of Target Heart Rate Range (THRR);Understanding of Exercise Prescription Increase Physical Activity;Increase Strength and Stamina;Able to understand and use rate of perceived exertion (RPE) scale;Able to understand and use Dyspnea scale;Knowledge and understanding of Target Heart Rate Range (THRR);Understanding of Exercise Prescription Increase Physical Activity;Increase Strength and Stamina;Able to understand and use rate of perceived exertion (RPE) scale;Able to understand and use Dyspnea scale;Knowledge and understanding of Target Heart Rate Range (THRR);Understanding of Exercise Prescription    Comments Pt is scheduled to begin exercise this week. Reine has completed 5 exercise sessions. She exercises on the Nustep and track for 15 min. Venissa averages 1.7 METs at level 2 on the Nustep and 2.04 METs on the track. She performs the warmup and cooldown standing/seated holding onto a chair dependent on her shortness of breath. [name] is very deconditioned making it difficult to progress her. She is very motivated to exercise and increase her functional capacity. Will continue to monitor and progress as able. [name] has completed 13 exercise sessions. She exercises on the Nustep and track for 15 min. [name] averages 1.6 METs at level 4 on the Nustep and 2.1 METs on the track. She performs the warmup and cooldown standing/seated holding onto a chair dependent on her shortness of breath. [name] has increased her workload for the Nustep despite her METs remaining the same. She is very motivated to exercise. We recently discusse home exercise as Iyauna is walking at home. She is trying to keep up her conditioning level. Will continue to monitor and progress as able. [name] has completed 20 exercise sessions. She exercises on the Nustep and track for 15 min. [name] averages 1.7 METs at level 5  on the Nustep and 2.54 METs on the track. She performs the warmup and cooldown mostly standing wtih a chair in reach if she needs it. [name] continues to increase her workload as METs have slightly increased. She tolerates progressions well as she is very motivated to exercise. [name] is still deconditioned but has a positive attitude in rehab. She exercises outside of rehab. [name] tries to get some walking in 2-3 non-rehab days/wk. Will continue to monitor and progress as able.    Expected Outcomes Through exercise at rehab and home, the patient will decrease shortness of breath with daily activities and feel confident in carrying out an exercise regimen. Through exercise at rehab and home, the patient will decrease shortness of breath with daily activities and  feel confident in carrying out an exercise regimen. Through exercise at rehab and home, the patient will decrease shortness of breath with daily activities and feel confident in carrying out an exercise regimen. Through exercise at rehab and home, the patient will decrease shortness of breath with daily activities and feel confident in carrying out an exercise regimen.             Discharge Exercise Prescription (Final Exercise Prescription Changes):  Exercise Prescription Changes - 11/07/22 1200       Response to Exercise   Blood Pressure (Admit) 114/60    Blood Pressure (Exercise) 120/66    Blood Pressure (Exit) 114/76    Heart Rate (Admit) 81 bpm    Heart Rate (Exercise) 78 bpm    Heart Rate (Exit) 73 bpm    Oxygen Saturation (Admit) 93 %    Oxygen Saturation (Exercise) 89 %    Oxygen Saturation (Exit) 96 %    Rating of Perceived Exertion (Exercise) 13    Perceived Dyspnea (Exercise) 2    Duration Continue with 30 min of aerobic exercise without signs/symptoms of physical distress.    Intensity THRR unchanged      Progression   Progression Continue to progress workloads to maintain intensity without signs/symptoms of physical  distress.      Resistance Training   Training Prescription Yes    Weight Red Bands    Reps 10-15    Time 10 Minutes      Oxygen   Oxygen Continuous    Liters 4      NuStep   Level 5    SPM 60    Minutes 15    METs 1.7      Track   Laps 9    Minutes 15    METs 2.38      Oxygen   Maintain Oxygen Saturation 88% or higher             Nutrition:  Target Goals: Understanding of nutrition guidelines, daily intake of sodium <1548m, cholesterol <2071m calories 30% from fat and 7% or less from saturated fats, daily to have 5 or more servings of fruits and vegetables.  Biometrics:  Pre Biometrics - 08/11/22 1615       Pre Biometrics   Grip Strength 24 kg              Nutrition Therapy Plan and Nutrition Goals:  Nutrition Therapy & Goals - 10/31/22 1205       Nutrition Therapy   Diet Heart Healthy/Carbohydrate Consistent diet    Drug/Food Interactions Statins/Certain Fruits      Personal Nutrition Goals   Nutrition Goal Patient to use the plate method as a guide for daily meal planning to include lean protein/plant protein, fruits, vegetables, whole grains, and nonfat/lowfat dairy as part of heart healthy lifestyle    Personal Goal #2 Patient to understand strategies for weight gain/weight maintenance of 0.5-2.0# per week    Personal Goal #3 Patient to limit sodium intake to <15001maily    Personal Goal #4 Patient to identify food sources and limit daily intake of saturated fat, trans fat, sodium, and refined carbohydrates    Comments [name]'s weight is down 4.4# since starting with our program. [place] adequate appetite/ no changes to appetite and she continues to eat three meals daily. She continues to eat a wide variety of foods including lean protein, fruits, vegetables, and whole grains. Her primary care doctor has placed referrals for a home  health aide, social work consult, and OT consult for ADLS to support [name] in living independently; she is  still awaiting follow-up since referrals have been placed. Her grandson continues to offer assistance in cleaning, meal preparation, etc. Our nursing staff also put in a referral for bill pay assistance as [name] continues to report financial strain. She reports no food insecurity at this time. We have previously discussed increasing eating frequency and/or nutrition supplements as a strategy for weight gain/weight maintenance. Furthermore, Agapita will benefit from additional support of Education officer, museum, home health, etc.      Intervention Plan   Intervention Prescribe, educate and counsel regarding individualized specific dietary modifications aiming towards targeted core components such as weight, hypertension, lipid management, diabetes, heart failure and other comorbidities.;Nutrition handout(s) given to patient.    Expected Outcomes Short Term Goal: Understand basic principles of dietary content, such as calories, fat, sodium, cholesterol and nutrients.;Long Term Goal: Adherence to prescribed nutrition plan.             Nutrition Assessments:  Nutrition Assessments - 11/07/22 1226       Rate Your Plate Scores   Post Score 58            MEDIFICTS Score Key: ?70 Need to make dietary changes  40-70 Heart Healthy Diet ? 40 Therapeutic Level Cholesterol Diet  Flowsheet Row PULMONARY REHAB OTHER RESPIRATORY from 11/07/2022 in [facility]  Picture Your Plate Total Score on Discharge 58      Picture Your Plate Scores: <52 Unhealthy dietary pattern with much room for improvement. 41-50 Dietary pattern unlikely to meet recommendations for good health and room for improvement. 51-60 More healthful dietary pattern, with some room for improvement.  >60 Healthy dietary pattern, although there may be some specific behaviors that could be improved.    Nutrition Goals Re-Evaluation:  Nutrition Goals Re-Evaluation     [place] Name  08/17/22 1101 09/07/22 1141 10/03/22 1043 10/31/22 1205       Goals   Current Weight 143 lb 8.3 oz (65.1 kg) 140 lb 3.4 oz (63.6 kg) 137 lb 12.6 oz (62.5 kg) 135 lb 12.9 oz (61.6 kg)    Comment Patient with history of CKD with kidney transplant 08/04/2011. She also has hx of CAD s/p CABG, COPD. Most recent labs show A1c 6.4, BUN 23, CR 1.61, GRF 30 Cr 1.4, BUN 21, GFR 38; [facility] transplant team continues to monitor renal labs. No new labs at this time. No new labs at this time    Expected Outcome [name] reports typical weight of 160-165#; she reports unwanted weight loss over the last >4 years. Per documentation, she was 153# on 08/01/2021 (down 9.8# in 12 months). She does lives alone and does her own grocery shopping and cooking. She reports eating a wide variety of foods and does eat three meals per day.  She is mindful of intake of refined carbohydrates and carbohydrate servings to support adequate blood sugar control. She does report variable appetite. She has good support from her adult grandson who visit her home daily and calls multiple times per day. Discussed increasing eating frequency/ implementing 1-2 snacks per day to support nutrition needs and weight maintenance. [name]'s weight is down 1.1# over the last 4 weeks. [name] reports increasing eating frequency to 4 meals per day up from 3 regular meals per day. Discussed increasing eating frequency to five meals per day and/or adding in nutrition supplements such as Ensure  Clear twice daily (360kcals, 16 protein). She reports good appetite. Encouraged patient to follow-up with primary care regarding referral or home health aid/social work to aid with ADLs. Patient reports continued support from her grandson. Anays's weight is down 2.4# since starting with our program. [name] reports adequate appetite/ no changes to appetite and she continues to eat three meals daily. She continues to eat a wide variety of foods including lean protein, fruits,  vegetables, and whole grains. She has not added any nutrition supplements as previously recommended. Her primary care doctor has placed referrals for a home health aide, social work consult, and OT consult for ADLS to [place] in living independently. Her grandson continues to offer assistance in cleaning, meal preparation, etc. Avionna's weight is down 4.4# since starting with our program. Deborra reports adequate appetite/ no changes to appetite and she continues to eat three meals daily. She continues to eat a wide variety of foods including lean protein, fruits, vegetables, and whole grains. Her primary care doctor has placed referrals for a home health aide, social work consult, and OT consult for ADLS to support Catalyna in living independently; she is still awaiting follow-up since referrals have been placed. Her grandson continues to offer assistance in cleaning, meal preparation, etc. Our nursing staff also put in a referral for bill pay assistance as Lasha continues to report financial strain. She reports no food insecurity at this time.             Nutrition Goals Discharge (Final Nutrition Goals Re-Evaluation):  Nutrition Goals Re-Evaluation - 10/31/22 1205       Goals   Current Weight 135 lb 12.9 oz (61.6 kg)    Comment No new labs at this time    Expected Outcome Malala's weight is down 4.4# since starting with our program. Anadelia reports adequate appetite/ no changes to appetite and she continues to eat three meals daily. She continues to eat a wide variety of foods including lean protein, fruits, vegetables, and whole grains. Her primary care doctor has placed referrals for a home health aide, social work consult, and OT consult for ADLS to support Kerith in living independently; she is still awaiting follow-up since referrals have been placed. Her grandson continues to offer assistance in cleaning, meal preparation, etc. Our nursing staff also put in a referral for bill pay  assistance as Caitlyn continues to report financial strain. She reports no food insecurity at this time.             Psychosocial: Target Goals: Acknowledge presence or absence of significant depression and/or stress, maximize coping skills, provide positive support system. Participant is able to verbalize types and ability to use techniques and skills needed for reducing stress and depression.  Initial Review & Psychosocial Screening:  Initial Psych Review & Screening - 08/11/22 1428       Initial Review   Current issues with None Identified      Family Dynamics   Good Support System? No    Comments has four children who live in [place] that do not help or concerned about her health.  [name] helps with errands.       Barriers   Psychosocial barriers to participate in program The patient should benefit from training in stress management and relaxation.      Screening Interventions   Interventions Encouraged to exercise    Expected Outcomes Short Term goal: Identification and review with participant of any Quality of Life or Depression concerns found by scoring  the questionnaire.;Long Term goal: The participant improves quality of Life and PHQ9 Scores as seen by post scores and/or verbalization of changes             Quality of Life Scores:  Scores of 19 and below usually indicate a poorer quality of life in these areas.  A difference of  2-3 points is a clinically meaningful difference.  A difference of 2-3 points in the total score of the Quality of Life Index has been associated with significant improvement in overall quality of life, self-image, physical symptoms, and general health in studies assessing change in quality of life.  PHQ-9: Review Flowsheet       08/11/2022 09/26/2018 07/01/2018  Depression screen PHQ 2/9  Decreased Interest 0 0 0  Down, Depressed, Hopeless 2 1 0  PHQ - 2 Score 2 1 0  Altered sleeping 0 0 0  Tired, decreased energy _0 Change in  appetite 1 0 0  Feeling bad or failure about yourself  0 1 0  Trouble concentrating 1 0 0  Moving slowly or fidgety/restless 1 0 0  Suicidal thoughts 0 - -  PHQ-9 Score _1 Difficult doing work/chores Not difficult at all Not difficult at all Not difficult at all   Interpretation of Total Score  Total Score Depression Severity:  1-4 = Minimal depression, 5-9 = Mild depression, 10-14 = Moderate depression, 15-19 = Moderately severe depression, 20-27 = Severe depression   Psychosocial Evaluation and Intervention:  Psychosocial Evaluation - 08/16/22 0818       Psychosocial Evaluation & Interventions   Interventions Encouraged to exercise with the program and follow exercise prescription    Comments Pt is scheduled to exercise this week. Ovetta does not seem to have pyschosocial barriers.    Expected Outcomes For Darrian to attend rehab free of psychosocial concerns.    Continue Psychosocial Services  No Follow up required             Psychosocial Re-Evaluation:  Psychosocial Re-Evaluation     Reyno Name 08/16/22 0824 09/07/22 1626 10/04/22 0735 11/02/22 1603       Psychosocial Re-Evaluation   Current issues with None Identified None Identified None Identified None Identified    Comments no identifiable barriers Yolando feels like she is doing well in rehab. She is currently working towards getting a home health aid to help her with daily activities. Thao is excited to recieve some help with her chores as they fatigue her easily. Kadance feels like she is doing well in rehab. She still working on getting a home health aid. Referrals placed for home health aid, social work, and OT. [name] is still assisting with some ADLs. Rika is motivated to exercise in class and states she is happy to be here. She has her "good" days and "bad" days. She recently expressed that she does have financial strain and her PCP referral for her home health aid is still in the works. Shevonne stated that  her dryer recently broke and she doesn't have the money this month to get it fixed. She stated her grandson has been helping her get to/from the laundry mat. Viviann stated her joy is the company of her 95 year old dog [name].    Expected Outcomes For pt to attend rehab free of psychosocial concerns. For pt to attend rehab free of psychosocial concerns. For pt to attend rehab free of psychosocial concerns. We hope that [name] will continue to attend class  without any other psychosocial concerns and will get a home health care aid soon.    Interventions Encouraged to attend Pulmonary Rehabilitation for the exercise Encouraged to attend Pulmonary Rehabilitation for the exercise Encouraged to attend Pulmonary Rehabilitation for the exercise Encouraged to attend Pulmonary Rehabilitation for the exercise;Stress management education;Relaxation education    Continue Psychosocial Services  No Follow up required No Follow up required No Follow up required No Follow up required             Psychosocial Discharge (Final Psychosocial Re-Evaluation):  Psychosocial Re-Evaluation - 11/02/22 1603       Psychosocial Re-Evaluation   Current issues with None Identified    Comments [name] is motivated to exercise in class and states she is happy to be here. She has her "good" days and "bad" days. She recently expressed that she does have financial strain and her PCP referral for her home health aid is still in the works. [name] stated that her dryer recently broke and she doesn't have the money this month to get it fixed. She stated her grandson has been helping her get to/from the laundry mat. Jackelyn stated her joy is the company of her 56 year old dog [name].    Expected Outcomes We hope that [name] will continue to attend class without any other psychosocial concerns and will get a home health care aid soon.    Interventions Encouraged to attend Pulmonary Rehabilitation for the exercise;Stress management  education;Relaxation education    Continue Psychosocial Services  No Follow up required             Education: Education Goals: Education classes will be provided on a weekly basis, covering required topics. Participant will state understanding/return demonstration of topics presented.  Learning Barriers/Preferences:  Learning Barriers/Preferences - 08/11/22 1429       Learning Barriers/Preferences   Learning Barriers Sight    Learning Preferences Written Material;Verbal Instruction;Group Instruction;Skilled Demonstration             Education Topics: Introduction to Pulmonary Rehab Group instruction provided by PowerPoint, verbal discussion, and written material to support subject matter. Instructor reviews what Pulmonary Rehab is, the purpose of the program, and how patients are referred.     Know Your Numbers Group instruction that is supported by a PowerPoint presentation. Instructor discusses importance of knowing and understanding resting, exercise, and post-exercise oxygen saturation, heart rate, and blood pressure. Oxygen saturation, heart rate, blood pressure, rating of perceived exertion, and dyspnea are reviewed along with a normal range for these values.  Flowsheet Row PULMONARY REHAB OTHER RESPIRATORY from 11/02/2022 in [facility] for Heart, Vascular, & Lung Health  Date 09/07/22  Educator EP  Instruction Review Code 1- Verbalizes Understanding       Exercise for the Pulmonary Patient Group instruction that is supported by a PowerPoint presentation. Instructor discusses benefits of exercise, core components of exercise, frequency, duration, and intensity of an exercise routine, importance of utilizing pulse oximetry during exercise, safety while exercising, and options of places to exercise outside of rehab.  Flowsheet Row PULMONARY REHAB OTHER RESPIRATORY from 09/26/2018 in [facility] for Heart, Vascular, & Lung  Health  Date 09/05/18  Educator EP  Instruction Review Code 1- Verbalizes Understanding          MET Level  Group instruction provided by PowerPoint, verbal discussion, and written material to support subject matter. Instructor reviews what METs are and how to increase METs.  Pulmonary Medications Verbally interactive group education provided by instructor with focus on inhaled medications and proper administration. Flowsheet Row PULMONARY REHAB OTHER RESPIRATORY from 09/26/2018 in [facility] for Heart, Vascular, & Lung Health  Date 07/30/18  Instruction Review Code 1- Verbalizes Understanding       Anatomy and Physiology of the Respiratory System Group instruction provided by PowerPoint, verbal discussion, and written material to support subject matter. Instructor reviews respiratory cycle and anatomical components of the respiratory system and their functions. Instructor also reviews differences in obstructive and restrictive respiratory diseases with examples of each.  Flowsheet Row PULMONARY REHAB OTHER RESPIRATORY from 11/02/2022 in [facility] for Heart, Vascular, & Lung Health  Date 08/31/22  Educator EP  Instruction Review Code 1- Verbalizes Understanding       Oxygen Safety Group instruction provided by PowerPoint, verbal discussion, and written material to support subject matter. There is an overview of "What is Oxygen" and "Why do we need it".  Instructor also reviews how to create a safe environment for oxygen use, the importance of using oxygen as prescribed, and the risks of noncompliance. There is a brief discussion on traveling with oxygen and resources the patient may utilize. Flowsheet Row PULMONARY REHAB OTHER RESPIRATORY from 11/02/2022 in [facility] for Heart, Vascular, & Lung Health  Date 09/28/22  Educator Donnetta Simpers  [Handout]  Instruction Review Code 1- Verbalizes Understanding        Oxygen Use Group instruction provided by PowerPoint, verbal discussion, and written material to discuss how supplemental oxygen is prescribed and different types of oxygen supply systems. Resources for more information are provided.  Flowsheet Row PULMONARY REHAB OTHER RESPIRATORY from 11/02/2022 in [facility] for Heart, Vascular, & Lung Health  Date 10/05/22  Educator Donnetta Simpers  [Handout]  Instruction Review Code 1- Verbalizes Understanding       Breathing Techniques Group instruction that is supported by demonstration and informational handouts. Instructor discusses the benefits of pursed lip and diaphragmatic breathing and detailed demonstration on how to perform both.  Flowsheet Row PULMONARY REHAB OTHER RESPIRATORY from 11/02/2022 in [facility] for Heart, Vascular, & Lung Health  Date 10/19/22  Educator EP  Instruction Review Code 1- Verbalizes Understanding        Risk Factor Reduction Group instruction that is supported by a PowerPoint presentation. Instructor discusses the definition of a risk factor, different risk factors for pulmonary disease, and how the heart and lungs work together. Flowsheet Row PULMONARY REHAB OTHER RESPIRATORY from 11/02/2022 in [facility] for Heart, Vascular, & [place]  Date 08/17/22  Educator EP  Instruction Review Code 1- Verbalizes Understanding       MD Day A group question and answer session with a medical doctor that allows participants to ask questions that relate to their pulmonary disease state. Flowsheet Row PULMONARY REHAB OTHER RESPIRATORY from 09/26/2018 in [facility] for Heart, Vascular, & [place]  Date 09/12/18  Educator yacoub  Instruction Review Code 1- Verbalizes Understanding       Nutrition for the Pulmonary Patient Group instruction provided by PowerPoint slides, verbal discussion, and written materials to  support subject matter. The instructor gives an explanation and review of healthy diet recommendations, which includes a discussion on weight management, recommendations for fruit and vegetable consumption, as well as protein, fluid, caffeine, fiber, sodium, sugar, and alcohol. Tips for eating when patients are short of  breath are discussed. Flowsheet Row PULMONARY REHAB OTHER RESPIRATORY from 09/26/2018 in [facility] for Heart, Vascular, & [place]  Date 07/18/18  Educator Rodman Pickle  Instruction Review Code 2- Demonstrated Understanding        Other Education Group or individual verbal, written, or video instructions that support the educational goals of the pulmonary rehab program. Flowsheet Row PULMONARY REHAB OTHER RESPIRATORY from 11/02/2022 in [facility] for Heart, Vascular, & [place]  Date 11/02/22  Educator Donnetta Simpers  [Handout]  Instruction Review Code 1- Verbalizes Understanding        Knowledge Questionnaire Score:  Knowledge Questionnaire Score - 11/08/22 0855       Knowledge Questionnaire Score   Post Score 16/18             Core Components/Risk Factors/Patient Goals at Admission:  Personal Goals and Risk Factors at Admission - 08/11/22 1430       Core Components/Risk Factors/Patient Goals on Admission   Improve shortness of breath with ADL's Yes    Intervention Provide education, individualized exercise plan and daily activity instruction to help decrease symptoms of SOB with activities of daily living.    Expected Outcomes Short Term: Improve cardiorespiratory fitness to achieve a reduction of symptoms when performing ADLs;Long Term: Be able to perform more ADLs without symptoms or delay the onset of symptoms    Hypertension Yes    Intervention Provide education on lifestyle modifcations including regular physical activity/exercise, weight management, moderate sodium restriction and increased consumption of fresh  fruit, vegetables, and low fat dairy, alcohol moderation, and smoking cessation.;Monitor prescription use compliance.    Expected Outcomes Short Term: Continued assessment and intervention until BP is < 140/5m HG in hypertensive participants. < 130/827mHG in hypertensive participants with diabetes, heart failure or chronic kidney disease.;Long Term: Maintenance of blood pressure at goal levels.             Core Components/Risk Factors/Patient Goals Review:   Goals and Risk Factor Review     Row Name 08/16/22 0825 09/07/22 1628 10/04/22 0737 11/02/22 1614       Core Components/Risk Factors/Patient Goals Review   Personal Goals Review Hypertension;Develop more efficient breathing techniques such as purse lipped breathing and diaphragmatic breathing and practicing self-pacing with activity.;Improve shortness of breath with ADL's Hypertension;Develop more efficient breathing techniques such as purse lipped breathing and diaphragmatic breathing and practicing self-pacing with activity.;Improve shortness of breath with ADL's Hypertension;Develop more efficient breathing techniques such as purse lipped breathing and diaphragmatic breathing and practicing self-pacing with activity.;Improve shortness of breath with ADL's Improve shortness of breath with ADL's;Develop more efficient breathing techniques such as purse lipped breathing and diaphragmatic breathing and practicing self-pacing with activity.;Hypertension    Review Pt is scheduled to begin exercise this week. Will continue to assess. HaRuthaas completed 7 exercise sessions. She feels that her functional capacity has improved since starting rehab. She is very motivated to exercise. Her METs on the Nustep have remained relatively the same although her track laps have increased. Her blood pressure has decreased since starting. Her last blood pressure was 114/60. HaShauntiaas completed 13 exercise sessions. HaEduardas still improving her functional  capacity. She hoped to maintain this conditioning level as she is walking at home. Her oxygen saturation has maintained around 89-95% on 4L. She hopes to get home health aid soon for some ADLs. Blood pressure has still been trending in a postive directions. Will continue to assess. HaMaryettaas completed  21 exercise sessions and is more knowledgeable about how to assess her dyspnea. She has attended education classes about breathing techniques, knowing your numbers, oxygen safety, and risk factor reduction. Valeen is improving her stamina and endurance. She has increased her track laps from 4-5 laps when she started to now walking 9-10 laps. Brenn has also increased her workload on the NuStep from 1 to 5 and her METS has increased as well. She has maintained her oxygenation on 4L while exercising. Her blood pressure has been monitored throughout the program and has been within normal limits. Ziona is compliant with her medications. We will continue to monitor her while in [place].    Expected Outcomes See admission goals. See admission goals. See admission goals. See admission goals             Core Components/Risk Factors/Patient Goals at Discharge (Final Review):   Goals and Risk Factor Review - 11/02/22 1614       Core Components/Risk Factors/Patient Goals Review   Personal Goals Review Improve shortness of breath with ADL's;Develop more efficient breathing techniques such as purse lipped breathing and diaphragmatic breathing and practicing self-pacing with activity.;Hypertension    Review Natausha has completed 21 exercise sessions and is more knowledgeable about how to assess her dyspnea. She has attended education classes about breathing techniques, knowing your numbers, oxygen safety, and risk factor reduction. Amaiya is improving her stamina and endurance. She has increased her track laps from 4-5 laps when she started to now walking 9-10 laps. Lalitha has also increased her workload on the NuStep  from 1 to 5 and her METS has increased as well. She has maintained her oxygenation on 4L while exercising. Her blood pressure has been monitored throughout the program and has been within normal limits. Ibtisam is compliant with her medications. We will continue to monitor her while in [place].    Expected Outcomes See admission goals             ITP Comments: Pt is making expected progress toward Pulmonary Rehab goals after completing 22 sessions. Recommend continued exercise, life style modification, education, and utilization of breathing techniques to increase stamina and strength, while also decreasing shortness of breath with exertion.   Dr. Rodman Pickle is Medical Director for Pulmonary Rehab at [facility].

## 2022-11-09 ENCOUNTER — Encounter (HOSPITAL_COMMUNITY)
Admission: RE | Admit: 2022-11-09 | Discharge: 2022-11-09 | Disposition: A | Discharge status: Home / Self Care | Payer: Medicare Other | Source: Ambulatory Visit | Attending: Pulmonary Disease | Admitting: Pulmonary Disease

## 2022-11-09 DIAGNOSIS — J4550 Severe persistent asthma, uncomplicated: Secondary | ICD-10-CM | POA: Diagnosis not present

## 2022-11-09 NOTE — Progress Notes (Signed)
Daily Session Note  Patient Details  Name: [name] MRN: [id number] Date of Birth: 03/31/1941 Referring Provider:   Flowsheet Row Pulmonary Rehab Walk Test from 08/11/2022 in [facility] for Heart, Vascular, & Lung Health  Referring Provider Ellison       Encounter Date: 11/09/2022  Check In:  Session Check In - 11/09/22 1434       Check-In   Supervising physician immediately available to respond to emergencies [facility] - Physician supervision    Physician(s) Dr. Olalare    Location [facility]    Staff Present Kaylee Davis, MS, ACSM-CEP, Exercise Physiologist;Other;Lisa Hughes, RN    Virtual Visit No    Medication changes reported     No    Fall or balance concerns reported    No    Tobacco Cessation No Change    Warm-up and Cool-down Performed as group-led instruction    Resistance Training Performed Yes    VAD Patient? No    PAD/SET Patient? No      Pain Assessment   Currently in Pain? No/denies    Multiple Pain Sites No             Capillary Blood Glucose: No results found for this or any previous visit (from the past 24 hour(s)).    Social History   Tobacco Use  Smoking Status Former   Packs/day: 1.00   Years: 8.00   Total pack years: 8.00   Types: Cigarettes   Quit date: 11/21/1983   Years since quitting: 38.9  Smokeless Tobacco Never    Goals Met:  Exercise tolerated well No report of concerns or symptoms today Strength training completed today  Goals Unmet:  Not Applicable  Comments: Service time is from 1324 to 1439    Dr. Jane Ellison is Medical Director for Pulmonary Rehab at [facility].  

## 2022-11-14 ENCOUNTER — Encounter (HOSPITAL_COMMUNITY)
Admission: RE | Admit: 2022-11-14 | Discharge: 2022-11-14 | Disposition: A | Discharge status: Home / Self Care | Payer: Medicare Other | Source: Ambulatory Visit | Attending: Pulmonary Disease | Admitting: Pulmonary Disease

## 2022-11-14 DIAGNOSIS — J4550 Severe persistent asthma, uncomplicated: Secondary | ICD-10-CM | POA: Diagnosis not present

## 2022-11-14 NOTE — Progress Notes (Signed)
Daily Session Note  Patient Details  Name: [name] MRN: [id number] Date of Birth: [birth year] Referring Provider:   April Ward Pulmonary Rehab Walk Test from 08/11/2022 in [facility] for Heart, Vascular, & [place]  Referring Provider [name]       Encounter Date: 11/14/2022  Check In:  Session Check In - 11/14/22 1134       Check-In   Supervising physician immediately available to respond to emergencies [facility] - Physician supervision    Physician(s) [name]    Location [facility]    Staff Present [name] Langton, RN;[name] Lilyan Punt, MS, ACSM-CEP, CCRP, Exercise Physiologist;[name], ACSM-CEP, Exercise Physiologist    Virtual Visit No    Medication changes reported     No    Fall or balance concerns reported    No    Tobacco Cessation No Change    Warm-up and Cool-down Performed as group-led instruction    Resistance Training Performed Yes    VAD Patient? No    PAD/SET Patient? No      Pain Assessment   Currently in Pain? No/denies    Multiple Pain Sites No             Capillary Blood Glucose: No results found for this or any previous visit (from the past 24 hour(s)).    Social History   Tobacco Use  Smoking Status Former   Packs/day: 1.00   Years: 8.00   Total pack years: 8.00   Types: Cigarettes   Quit date: 11/21/1983   Years since quitting: 39.0  Smokeless Tobacco Never    Goals Met:  Independence with exercise equipment Exercise tolerated well No report of concerns or symptoms today Strength training completed today  Goals Unmet:  Not Applicable  Comments: Service time is from 1010 to 1144.    [name] is Medical Director for Pulmonary Rehab at [facility].

## 2022-11-16 ENCOUNTER — Encounter (HOSPITAL_COMMUNITY)
Admission: RE | Admit: 2022-11-16 | Discharge: 2022-11-16 | Disposition: A | Discharge status: Home / Self Care | Payer: Medicare Other | Source: Ambulatory Visit | Attending: Pulmonary Disease | Admitting: Pulmonary Disease

## 2022-11-16 DIAGNOSIS — J4550 Severe persistent asthma, uncomplicated: Secondary | ICD-10-CM

## 2022-11-16 NOTE — Progress Notes (Signed)
Daily Session Note  Patient Details  Name: [name] MRN: [id number] Date of Birth: 06/22/1941 Referring Provider:   [name] Pulmonary Rehab Walk Test from 08/11/2022 in [facility] for Heart, Vascular, & [place]  Referring Provider [name]       Encounter Date: 11/16/2022  Check In:  Session Check In - 11/16/22 1130       Check-In   Supervising physician immediately available to respond to emergencies [facility] - Physician supervision    Physician(s) Dr. Erskine Emery    Location [facility]    Staff Present [name] Langton, RN;[name] Starleen Blue, MS, Exercise Physiologist;[name], ACSM-CEP, Exercise Physiologist    Virtual Visit No    Medication changes reported     No    Fall or balance concerns reported    No    Tobacco Cessation No Change    Warm-up and Cool-down Not performed (comment)    Resistance Training Performed No    VAD Patient? No    PAD/SET Patient? No      Pain Assessment   Currently in Pain? No/denies    Multiple Pain Sites No             Capillary Blood Glucose: No results found for this or any previous visit (from the past 24 hour(s)).    Social History   Tobacco Use  Smoking Status Former   Packs/day: 1.00   Years: 8.00   Total pack years: 8.00   Types: Cigarettes   Quit date: 11/21/1983   Years since quitting: 39.0  Smokeless Tobacco Never    Goals Met:  Independence with exercise equipment Exercise tolerated well No report of concerns or symptoms today Strength training completed today  Goals Unmet:  Not Applicable  Comments: Service time is from 0945 to 1015. Pt completed six min walk test and graduated program.    Dr. Rodman Pickle is Medical Director for Pulmonary Rehab at [facility].

## 2022-11-21 ENCOUNTER — Encounter (HOSPITAL_COMMUNITY): Payer: Medicare Other

## 2022-11-21 NOTE — Progress Notes (Signed)
Discharge Progress Report  Patient Details  Name: [name] MRN: [id number] Date of Birth: 1941-09-17 Referring Provider:   [name] Pulmonary Rehab Walk Test from 08/11/2022 in [facility] [place]  Referring Provider [name]        Number of Visits: 25  Reason for Discharge:  Patient reached a stable level of exercise. Patient independent in their exercise. Patient has met program and personal goals.  Smoking History:  Social History   Tobacco Use  Smoking Status Former   Packs/day: 1.00   Years: 8.00   Total pack years: 8.00   Types: Cigarettes   Quit date: 11/21/1983   Years since quitting: 39.0  Smokeless Tobacco Never    Diagnosis:  Severe persistent asthma without complication  ADL UCSD:  Pulmonary Assessment Scores     Row Name 08/11/22 1437 08/11/22 1613 11/08/22 0856     ADL UCSD   ADL Phase -- -- Exit   SOB Score total -- 90 100     CAT Score   CAT Score -- 25 30     mMRC Score   mMRC Score 4 4 --            Initial Exercise Prescription:  Initial Exercise Prescription - 08/11/22 1600       Date of Initial Exercise RX and Referring Provider   Date 08/11/22    Referring Provider [name]    Expected Discharge Date 10/19/22      Oxygen   Oxygen Continuous    Liters 2    Maintain Oxygen Saturation 88% or higher      NuStep   Level 1    SPM 60    Minutes 15      Track   Minutes 15    METs 2      Prescription Details   Frequency (times per week) 2    Duration Progress to 30 minutes of continuous aerobic without signs/symptoms of physical distress      Intensity   THRR 40-80% of Max Heartrate 56-113    Ratings of Perceived Exertion 11-13    Perceived Dyspnea 0-4      Progression   Progression Continue progressive overload as per policy without signs/symptoms or physical distress.      Resistance Training   Training Prescription Yes    Weight red bands    Reps  10-15             Discharge Exercise Prescription (Final Exercise Prescription Changes):  Exercise Prescription Changes - 11/07/22 1200       Response to Exercise   Blood Pressure (Admit) 114/60    Blood Pressure (Exercise) 120/66    Blood Pressure (Exit) 114/76    Heart Rate (Admit) 81 bpm    Heart Rate (Exercise) 78 bpm    Heart Rate (Exit) 73 bpm    Oxygen Saturation (Admit) 93 %    Oxygen Saturation (Exercise) 89 %    Oxygen Saturation (Exit) 96 %    Rating of Perceived Exertion (Exercise) 13    Perceived Dyspnea (Exercise) 2    Duration Continue with 30 min of aerobic exercise without signs/symptoms of physical distress.    Intensity THRR unchanged      Progression   Progression Continue to progress workloads to maintain intensity without signs/symptoms of physical distress.      Resistance Training   Training Prescription Yes    Weight Red Bands    Reps  10-15    Time 10 Minutes      Oxygen   Oxygen Continuous    Liters 4      NuStep   Level 5    SPM 60    Minutes 15    METs 1.7      Track   Laps 9    Minutes 15    METs 2.38      Oxygen   Maintain Oxygen Saturation 88% or higher             Functional Capacity:  6 Minute Walk     Row Name 08/11/22 1619 11/16/22 1643       6 Minute Walk   Phase Initial Discharge    Distance 562 feet 780 feet    Distance % Change -- 38.8 %    Distance Feet Change -- 218 ft    Walk Time 6 minutes 6 minutes    # of Rest Breaks 1 2    MPH 1.06 1.48    METS 2.2 1.83    RPE 12 15    Perceived Dyspnea  2 2    VO2 Peak 7.69 6.39    Symptoms Yes (comment) Yes (comment)    Comments SOB and fatigue, standing rest 2:00 min to 2:23 Pt SpO2 down to 84 x2, necessitating a rest break. First was 2:50 -3:40. Second was 5:45-6:00. SpO2 up with pursed lip breathing and rest    Resting HR 85 bpm 83 bpm    Resting BP 140/60 142/78    Resting Oxygen Saturation  92 % 93 %    Exercise Oxygen Saturation  during 6 min walk  90 % 84 %    Max Ex. HR 88 bpm 93 bpm    Max Ex. BP 144/62 184/70    2 Minute Post BP 138/66 156/70      Interval HR   1 Minute HR 88 92    2 Minute HR 87 93    3 Minute HR 85 92    4 Minute HR 86 87    5 Minute HR 85 93    6 Minute HR 88 93    2 Minute Post HR 68 75    Interval Heart Rate? Yes Yes      Interval Oxygen   Interval Oxygen? Yes Yes    Baseline Oxygen Saturation % 92 % 93 %    1 Minute Oxygen Saturation % 96 % 94 %    1 Minute Liters of Oxygen 2 L 4 L    2 Minute Oxygen Saturation % 94 % 88 %    2 Minute Liters of Oxygen 0 L 4 L    3 Minute Oxygen Saturation % 92 % 84 %    3 Minute Liters of Oxygen 2 L 4 L    4 Minute Oxygen Saturation % 94 % 89 %    4 Minute Liters of Oxygen 2 L 4 L    5 Minute Oxygen Saturation % 90 % 89 %    5 Minute Liters of Oxygen 2 L 4 L    6 Minute Oxygen Saturation % 92 % 84 %    6 Minute Liters of Oxygen 2 L 4 L    2 Minute Post Oxygen Saturation % 91 % 90 %    2 Minute Post Liters of Oxygen 2 L 4 L             Psychological, QOL, Others - Outcomes: PHQ 2/9:  11/17/2022    7:34 AM 08/11/2022    2:16 PM 09/26/2018   11:27 AM 07/01/2018   10:58 AM  Depression screen PHQ 2/9  Decreased Interest 0 0 0 0  Down, Depressed, Hopeless 0 2 1 0  PHQ - 2 Score 0 2 1 0  Altered sleeping 0 0 0 0  Tired, decreased energy 0 _0 Change in appetite 0 1 0 0  Feeling bad or failure about yourself  0 0 1 0  Trouble concentrating 0 1 0 0  Moving slowly or fidgety/restless 0 1 0 0  Suicidal thoughts 0 0    PHQ-9 Score 0 _1 Difficult doing work/chores  Not difficult at all Not difficult at all Not difficult at all    Quality of Life:   Personal Goals: Goals established at orientation with interventions provided to work toward goal.  Personal Goals and Risk Factors at Admission - 08/11/22 1430       Core Components/Risk Factors/Patient Goals on Admission   Improve shortness of breath with ADL's Yes    Intervention Provide  education, individualized exercise plan and daily activity instruction to help decrease symptoms of SOB with activities of daily living.    Expected Outcomes Short Term: Improve cardiorespiratory fitness to achieve a reduction of symptoms when performing ADLs;Long Term: Be able to perform more ADLs without symptoms or delay the onset of symptoms    Hypertension Yes    Intervention Provide education on lifestyle modifcations including regular physical activity/exercise, weight management, moderate sodium restriction and increased consumption of fresh fruit, vegetables, and low fat dairy, alcohol moderation, and smoking cessation.;Monitor prescription use compliance.    Expected Outcomes Short Term: Continued assessment and intervention until BP is < 140/3m HG in hypertensive participants. < 130/869mHG in hypertensive participants with diabetes, heart failure or chronic kidney disease.;Long Term: Maintenance of blood pressure at goal levels.              Personal Goals Discharge:  Goals and Risk Factor Review     Row Name 08/16/22 0825 09/07/22 1628 10/04/22 0737 11/02/22 1614       Core Components/Risk Factors/Patient Goals Review   Personal Goals Review Hypertension;Develop more efficient breathing techniques such as purse lipped breathing and diaphragmatic breathing and practicing self-pacing with activity.;Improve shortness of breath with ADL's Hypertension;Develop more efficient breathing techniques such as purse lipped breathing and diaphragmatic breathing and practicing self-pacing with activity.;Improve shortness of breath with ADL's Hypertension;Develop more efficient breathing techniques such as purse lipped breathing and diaphragmatic breathing and practicing self-pacing with activity.;Improve shortness of breath with ADL's Improve shortness of breath with ADL's;Develop more efficient breathing techniques such as purse lipped breathing and diaphragmatic breathing and practicing  self-pacing with activity.;Hypertension    Review Pt is scheduled to begin exercise this week. Will continue to assess. HaBeckettas completed 7 exercise sessions. She feels that her functional capacity has improved since starting rehab. She is very motivated to exercise. Her METs on the Nustep have remained relatively the same although her track laps have increased. Her blood pressure has decreased since starting. Her last blood pressure was 114/60. HaAlishahas completed 13 exercise sessions. HaAlexsias still improving her functional capacity. She hoped to maintain this conditioning level as she is walking at home. Her oxygen saturation has maintained around 89-95% on 4L. She hopes to get home health aid soon for some ADLs. Blood pressure has still been trending in a postive directions. Will continue  to assess. Aashvi has completed 21 exercise sessions and is more knowledgeable about how to assess her dyspnea. She has attended education classes about breathing techniques, knowing your numbers, oxygen safety, and risk factor reduction. Barabara is improving her stamina and endurance. She has increased her track laps from 4-5 laps when she started to now walking 9-10 laps. Myrissa has also increased her workload on the NuStep from 1 to 5 and her METS has increased as well. She has maintained her oxygenation on 4L while exercising. Her blood pressure has been monitored throughout the program and has been within normal limits. Ricardo is compliant with her medications. We will continue to monitor her while in [place].    Expected Outcomes See admission goals. See admission goals. See admission goals. See admission goals             Exercise Goals and Review:  Exercise Goals     Row Name 08/11/22 1435 08/14/22 0748 09/04/22 1145 10/04/22 0728 11/01/22 0807     Exercise Goals   Increase Physical Activity _0    Intervention Provide advice, education, support and counseling about physical  activity/exercise needs.;Develop an individualized exercise prescription for aerobic and resistive training based on initial evaluation findings, risk stratification, comorbidities and participant's personal goals. Provide advice, education, support and counseling about physical activity/exercise needs.;Develop an individualized exercise prescription for aerobic and resistive training based on initial evaluation findings, risk stratification, comorbidities and participant's personal goals. Provide advice, education, support and counseling about physical activity/exercise needs.;Develop an individualized exercise prescription for aerobic and resistive training based on initial evaluation findings, risk stratification, comorbidities and participant's personal goals. Provide advice, education, support and counseling about physical activity/exercise needs.;Develop an individualized exercise prescription for aerobic and resistive training based on initial evaluation findings, risk stratification, comorbidities and participant's personal goals. Provide advice, education, support and counseling about physical activity/exercise needs.;Develop an individualized exercise prescription for aerobic and resistive training based on initial evaluation findings, risk stratification, comorbidities and participant's personal goals.   Expected Outcomes Short Term: Attend rehab on a regular basis to increase amount of physical activity.;Long Term: Add in home exercise to make exercise part of routine and to increase amount of physical activity.;Long Term: Exercising regularly at least 3-5 days a week. Short Term: Attend rehab on a regular basis to increase amount of physical activity.;Long Term: Add in home exercise to make exercise part of routine and to increase amount of physical activity.;Long Term: Exercising regularly at least 3-5 days a week. Short Term: Attend rehab on a regular basis to increase amount of physical  activity.;Long Term: Add in home exercise to make exercise part of routine and to increase amount of physical activity.;Long Term: Exercising regularly at least 3-5 days a week. Short Term: Attend rehab on a regular basis to increase amount of physical activity.;Long Term: Add in home exercise to make exercise part of routine and to increase amount of physical activity.;Long Term: Exercising regularly at least 3-5 days a week. Short Term: Attend rehab on a regular basis to increase amount of physical activity.;Long Term: Add in home exercise to make exercise part of routine and to increase amount of physical activity.;Long Term: Exercising regularly at least 3-5 days a week.   Increase Strength and Stamina _1    Intervention Provide advice, education, support and counseling about physical activity/exercise needs.;Develop an individualized exercise prescription for aerobic and resistive training based on initial evaluation findings, risk stratification, comorbidities and participant's personal goals.  Provide advice, education, support and counseling about physical activity/exercise needs.;Develop an individualized exercise prescription for aerobic and resistive training based on initial evaluation findings, risk stratification, comorbidities and participant's personal goals. Provide advice, education, support and counseling about physical activity/exercise needs.;Develop an individualized exercise prescription for aerobic and resistive training based on initial evaluation findings, risk stratification, comorbidities and participant's personal goals. Provide advice, education, support and counseling about physical activity/exercise needs.;Develop an individualized exercise prescription for aerobic and resistive training based on initial evaluation findings, risk stratification, comorbidities and participant's personal goals. Provide advice, education, support and counseling about physical  activity/exercise needs.;Develop an individualized exercise prescription for aerobic and resistive training based on initial evaluation findings, risk stratification, comorbidities and participant's personal goals.   Expected Outcomes Short Term: Increase workloads from initial exercise prescription for resistance, speed, and METs.;Short Term: Perform resistance training exercises routinely during rehab and add in resistance training at home;Long Term: Improve cardiorespiratory fitness, muscular endurance and strength as measured by increased METs and functional capacity (6MWT) Short Term: Increase workloads from initial exercise prescription for resistance, speed, and METs.;Short Term: Perform resistance training exercises routinely during rehab and add in resistance training at home;Long Term: Improve cardiorespiratory fitness, muscular endurance and strength as measured by increased METs and functional capacity (6MWT) Short Term: Increase workloads from initial exercise prescription for resistance, speed, and METs.;Short Term: Perform resistance training exercises routinely during rehab and add in resistance training at home;Long Term: Improve cardiorespiratory fitness, muscular endurance and strength as measured by increased METs and functional capacity (6MWT) Short Term: Increase workloads from initial exercise prescription for resistance, speed, and METs.;Short Term: Perform resistance training exercises routinely during rehab and add in resistance training at home;Long Term: Improve cardiorespiratory fitness, muscular endurance and strength as measured by increased METs and functional capacity (6MWT) Short Term: Increase workloads from initial exercise prescription for resistance, speed, and METs.;Short Term: Perform resistance training exercises routinely during rehab and add in resistance training at home;Long Term: Improve cardiorespiratory fitness, muscular endurance and strength as measured by increased  METs and functional capacity (6MWT)   Able to understand and use rate of perceived exertion (RPE) scale _0    Intervention Provide education and explanation on how to use RPE scale Provide education and explanation on how to use RPE scale Provide education and explanation on how to use RPE scale Provide education and explanation on how to use RPE scale Provide education and explanation on how to use RPE scale   Expected Outcomes Short Term: Able to use RPE daily in rehab to express subjective intensity level;Long Term:  Able to use RPE to guide intensity level when exercising independently Short Term: Able to use RPE daily in rehab to express subjective intensity level;Long Term:  Able to use RPE to guide intensity level when exercising independently Short Term: Able to use RPE daily in rehab to express subjective intensity level;Long Term:  Able to use RPE to guide intensity level when exercising independently Short Term: Able to use RPE daily in rehab to express subjective intensity level;Long Term:  Able to use RPE to guide intensity level when exercising independently Short Term: Able to use RPE daily in rehab to express subjective intensity level;Long Term:  Able to use RPE to guide intensity level when exercising independently   Able to understand and use Dyspnea scale _1    Intervention Provide education and explanation on how to use Dyspnea scale Provide education and explanation on how to use  Dyspnea scale Provide education and explanation on how to use Dyspnea scale Provide education and explanation on how to use Dyspnea scale Provide education and explanation on how to use Dyspnea scale   Expected Outcomes Short Term: Able to use Dyspnea scale daily in rehab to express subjective sense of shortness of breath during exertion;Long Term: Able to use Dyspnea scale to guide intensity level when exercising independently Short Term: Able to use Dyspnea scale daily in rehab  to express subjective sense of shortness of breath during exertion;Long Term: Able to use Dyspnea scale to guide intensity level when exercising independently Short Term: Able to use Dyspnea scale daily in rehab to express subjective sense of shortness of breath during exertion;Long Term: Able to use Dyspnea scale to guide intensity level when exercising independently Short Term: Able to use Dyspnea scale daily in rehab to express subjective sense of shortness of breath during exertion;Long Term: Able to use Dyspnea scale to guide intensity level when exercising independently Short Term: Able to use Dyspnea scale daily in rehab to express subjective sense of shortness of breath during exertion;Long Term: Able to use Dyspnea scale to guide intensity level when exercising independently   Knowledge and understanding of Target Heart Rate Range (THRR) _0    Intervention Provide education and explanation of THRR including how the numbers were predicted and where they are located for reference Provide education and explanation of THRR including how the numbers were predicted and where they are located for reference Provide education and explanation of THRR including how the numbers were predicted and where they are located for reference Provide education and explanation of THRR including how the numbers were predicted and where they are located for reference Provide education and explanation of THRR including how the numbers were predicted and where they are located for reference   Expected Outcomes Short Term: Able to state/look up THRR;Long Term: Able to use THRR to govern intensity when exercising independently;Short Term: Able to use daily as guideline for intensity in rehab Short Term: Able to state/look up THRR;Long Term: Able to use THRR to govern intensity when exercising independently;Short Term: Able to use daily as guideline for intensity in rehab Short Term: Able to state/look up THRR;Long  Term: Able to use THRR to govern intensity when exercising independently;Short Term: Able to use daily as guideline for intensity in rehab Short Term: Able to state/look up THRR;Long Term: Able to use THRR to govern intensity when exercising independently;Short Term: Able to use daily as guideline for intensity in rehab Short Term: Able to state/look up THRR;Long Term: Able to use THRR to govern intensity when exercising independently;Short Term: Able to use daily as guideline for intensity in rehab   Understanding of Exercise Prescription _1    Intervention Provide education, explanation, and written materials on patient's individual exercise prescription Provide education, explanation, and written materials on patient's individual exercise prescription Provide education, explanation, and written materials on patient's individual exercise prescription Provide education, explanation, and written materials on patient's individual exercise prescription Provide education, explanation, and written materials on patient's individual exercise prescription   Expected Outcomes Short Term: Able to explain program exercise prescription;Long Term: Able to explain home exercise prescription to exercise independently Short Term: Able to explain program exercise prescription;Long Term: Able to explain home exercise prescription to exercise independently Short Term: Able to explain program exercise prescription;Long Term: Able to explain home exercise prescription to exercise independently Short Term: Able to explain program exercise  prescription;Long Term: Able to explain home exercise prescription to exercise independently Short Term: Able to explain program exercise prescription;Long Term: Able to explain home exercise prescription to exercise independently            Exercise Goals Re-Evaluation:  Exercise Goals Re-Evaluation     Row Name 08/14/22 0752 09/04/22 1145 10/04/22 0728 11/01/22 0807        Exercise Goal Re-Evaluation   Exercise Goals Review Increase Physical Activity;Increase Strength and Stamina;Able to understand and use rate of perceived exertion (RPE) scale;Able to understand and use Dyspnea scale;Knowledge and understanding of Target Heart Rate Range (THRR);Understanding of Exercise Prescription Increase Physical Activity;Increase Strength and Stamina;Able to understand and use rate of perceived exertion (RPE) scale;Able to understand and use Dyspnea scale;Knowledge and understanding of Target Heart Rate Range (THRR);Understanding of Exercise Prescription Increase Physical Activity;Increase Strength and Stamina;Able to understand and use rate of perceived exertion (RPE) scale;Able to understand and use Dyspnea scale;Knowledge and understanding of Target Heart Rate Range (THRR);Understanding of Exercise Prescription Increase Physical Activity;Increase Strength and Stamina;Able to understand and use rate of perceived exertion (RPE) scale;Able to understand and use Dyspnea scale;Knowledge and understanding of Target Heart Rate Range (THRR);Understanding of Exercise Prescription    Comments Pt is scheduled to begin exercise this week. Nafeesa has completed 5 exercise sessions. She exercises on the Nustep and track for 15 min. Junie averages 1.7 METs at level 2 on the Nustep and 2.04 METs on the track. She performs the warmup and cooldown standing/seated holding onto a chair dependent on her shortness of breath. Camey is very deconditioned making it difficult to progress her. She is very motivated to exercise and increase her functional capacity. Will continue to monitor and progress as able. Aasiya has completed 13 exercise sessions. She exercises on the Nustep and track for 15 min. Carleah averages 1.6 METs at level 4 on the Nustep and 2.1 METs on the track. She performs the warmup and cooldown standing/seated holding onto a chair dependent on her shortness of breath. Oveda has increased  her workload for the Nustep despite her METs remaining the same. She is very motivated to exercise. We recently discusse home exercise as Nakesha is walking at home. She is trying to keep up her conditioning level. Will continue to monitor and progress as able. Yenifer has completed 20 exercise sessions. She exercises on the Nustep and track for 15 min. Tamasha averages 1.7 METs at level 5 on the Nustep and 2.54 METs on the track. She performs the warmup and cooldown mostly standing wtih a chair in reach if she needs it. Bliss continues to increase her workload as METs have slightly increased. She tolerates progressions well as she is very motivated to exercise. Dagny is still deconditioned but has a positive attitude in rehab. She exercises outside of rehab. Mckynzi tries to get some walking in 2-3 non-rehab days/wk. Will continue to monitor and progress as able.    Expected Outcomes Through exercise at rehab and home, the patient will decrease shortness of breath with daily activities and feel confident in carrying out an exercise regimen. Through exercise at rehab and home, the patient will decrease shortness of breath with daily activities and feel confident in carrying out an exercise regimen. Through exercise at rehab and home, the patient will decrease shortness of breath with daily activities and feel confident in carrying out an exercise regimen. Through exercise at rehab and home, the patient will decrease shortness of breath with daily activities and feel confident  in carrying out an exercise regimen.             Nutrition & Weight - Outcomes:  Pre Biometrics - 08/11/22 1615       Pre Biometrics   Grip Strength 24 kg             Post Biometrics - 11/16/22 1653        Post  Biometrics   Grip Strength 18 kg             Nutrition:  Nutrition Therapy & Goals - 10/31/22 1205       Nutrition Therapy   Diet Heart Healthy/Carbohydrate Consistent diet    Drug/Food Interactions  Statins/Certain Fruits      Personal Nutrition Goals   Nutrition Goal Patient to use the plate method as a guide for daily meal planning to include lean protein/plant protein, fruits, vegetables, whole grains, and nonfat/lowfat dairy as part of heart healthy lifestyle    Personal Goal #2 Patient to understand strategies for weight gain/weight maintenance of 0.5-2.0# per week    Personal Goal #3 Patient to limit sodium intake to <1560m daily    Personal Goal #4 Patient to identify food sources and limit daily intake of saturated fat, trans fat, sodium, and refined carbohydrates    Comments Latrena's weight is down 4.4# since starting with our program. HDominoreports adequate appetite/ no changes to appetite and she continues to eat three meals daily. She continues to eat a wide variety of foods including lean protein, fruits, vegetables, and whole grains. Her primary care doctor has placed referrals for a home health aide, social work consult, and OT consult for ADLS to support HRubenain living independently; she is still awaiting follow-up since referrals have been placed. Her grandson continues to offer assistance in cleaning, meal preparation, etc. Our nursing staff also put in a referral for bill pay assistance as HAnbercontinues to report financial strain. She reports no food insecurity at this time. We have previously discussed increasing eating frequency and/or nutrition supplements as a strategy for weight gain/weight maintenance. Furthermore, HAvalinwill benefit from additional support of sEducation officer, museum home [facility], etc.      Intervention Plan   Intervention Prescribe, educate and counsel regarding individualized specific dietary modifications aiming towards targeted core components such as weight, hypertension, lipid management, diabetes, heart failure and other comorbidities.;Nutrition handout(s) given to patient.    Expected Outcomes Short Term Goal: Understand basic principles of dietary  content, such as calories, fat, sodium, cholesterol and nutrients.;Long Term Goal: Adherence to prescribed nutrition plan.             Nutrition Discharge:  Nutrition Assessments - 11/07/22 1226       Rate Your Plate Scores   Post Score 58             Education Questionnaire Score:  Knowledge Questionnaire Score - 11/08/22 0855       Knowledge Questionnaire Score   Post Score 16/18             Goals reviewed with patient; copy given to patient.

## 2022-11-23 ENCOUNTER — Other Ambulatory Visit: Payer: Self-pay | Admitting: Physician Assistant

## 2022-11-28 ENCOUNTER — Other Ambulatory Visit: Payer: Self-pay | Admitting: Physician Assistant

## 2023-01-01 ENCOUNTER — Ambulatory Visit: Payer: 59 | Admitting: Internal Medicine

## 2023-01-01 ENCOUNTER — Encounter: Payer: Self-pay | Admitting: Internal Medicine

## 2023-01-01 VITALS — BP 118/76 | HR 73 | Ht 62.0 in | Wt 137.8 lb

## 2023-01-01 DIAGNOSIS — E1122 Type 2 diabetes mellitus with diabetic chronic kidney disease: Secondary | ICD-10-CM | POA: Diagnosis not present

## 2023-01-01 DIAGNOSIS — Z794 Long term (current) use of insulin: Secondary | ICD-10-CM

## 2023-01-01 DIAGNOSIS — N1831 Chronic kidney disease, stage 3a: Secondary | ICD-10-CM

## 2023-01-01 DIAGNOSIS — Z940 Kidney transplant status: Secondary | ICD-10-CM | POA: Diagnosis not present

## 2023-01-01 LAB — POCT GLUCOSE (DEVICE FOR HOME USE): POC Glucose: 95 mg/dL (ref 70–99)

## 2023-01-01 LAB — POCT GLYCOSYLATED HEMOGLOBIN (HGB A1C): Hemoglobin A1C: 6 % — AB (ref 4.0–5.6)

## 2023-01-01 NOTE — Progress Notes (Signed)
Name: [name]  Age/ Sex: 82 y.o., female   MRN/ DOB: [id number], 07-05-1941     PCP: Chesley Noon, MD   Reason for Endocrinology Evaluation: Type 2 Diabetes Mellitus  Initial Endocrine Consultative Visit: 02/28/2013    PATIENT IDENTIFIER: [name] is a 82 y.o. female with a past medical history of T2DM, CAD, Hx of ESRD ( S/P transplant 2013) . The patient has followed with Endocrinology clinic since 02/28/2013 for consultative assistance with management of her diabetes.  DIABETIC HISTORY:  [name] was diagnosed with DM 2013.  She was on oral glycemic agents such as glipizide (discontinued in 2019) Her hemoglobin A1c has ranged from 5.5% in 2014, peaking at 6.5% in 2022.  She was on Glipizide but discontinued in 2019  She was followed by Dr. [name] from 2014 until 02/2022    SUBJECTIVE:   During the last visit (02/22/2022): A1c 6.4%  Today (01/01/2023): [name] is here for a follow up on diabetes management .  She checks her blood sugars occasionally.   She had a follow-up with pulmonary 12/07/2022 for further evaluation of dyspnea, patient on Breo and tiotropium, oxygen 2 L at rest She had a follow-up with renal transplant team through [facility] 11/09/2022  Follows with nephrology Dr. 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, hold ASA 2-3 weeks - consider plavix instead of ASA   Asthma    Cellulitis 12/19/2019   right upper  arm   Coronary artery disease    cath 11/2007, grafts patent /  Nuclear, June, 2011, prior inferior MI with mild peri-infarct ischemia, anterior breast attenuation, EF 67%, done for renal transplant assessment / Low level exercise/Lexiscan Myoview (11/2013): EF 67%, no ischemi; normal study   Diabetes mellitus    type 2   ESRD on dialysis [facility])    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, 2012   Family history of breast cancer    Family history of prostate cancer    GERD (gastroesophageal reflux disease)    GI bleed 11/26/2010   January, 2012 , AVM   Gout    History of methicillin resistant staphylococcus aureus (MRSA)    Hyperlipidemia    Hyperparathyroidism    Hypertension    Myocardial infarction [facility])    Osteoporosis    Pneumonia 08/2010    Hospitalization, October, 2011   Primary osteoarthritis, 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   S/P kidney transplant    September, 2012, [facility]   Subacromial impingement of 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   Past Surgical History:  Past Surgical History:  Procedure Laterality Date   ABDOMINAL HYSTERECTOMY  1980'S   ARTERIOVENOUS GRAFT PLACEMENT Left 03/04/2007   forearm   BIOPSY  09/30/2019   Procedure: BIOPSY;  Surgeon: Wilford Corner, MD;  Location: [facility];  Service: Endoscopy;;   BIOPSY  12/27/2021   Procedure: BIOPSY;  Surgeon: Wilford Corner, MD;  Location: [facility];  Service: Endoscopy;;   BREAST EXCISIONAL BIOPSY Right    benign  more than 10 yr ago   [place] AGO   RT BREAST CYST REMOVED    CARDIAC CATHETERIZATION  06/03/2002; 12/04/2007   COLONOSCOPY WITH PROPOFOL N/A 07/14/2014   Procedure: COLONOSCOPY WITH PROPOFOL;  Surgeon: Lear Ng, MD;  Location: [facility];  Service: Endoscopy;  Laterality: N/A;   COLONOSCOPY WITH PROPOFOL N/A 09/30/2019   Procedure: COLONOSCOPY WITH PROPOFOL;  Surgeon: Wilford Corner, MD;  [facility]: [facility];  Service: Endoscopy;  Laterality: N/A;   COLONOSCOPY WITH PROPOFOL N/A 12/27/2021    Procedure: COLONOSCOPY WITH PROPOFOL;  Surgeon: Wilford Corner, MD;  [facility];  Service: Endoscopy;  Laterality: N/A;   CORONARY ARTERY BYPASS GRAFT  06/06/2002   x 4   ESOPHAGOGASTRODUODENOSCOPY N/A 12/27/2021   Procedure: ESOPHAGOGASTRODUODENOSCOPY (EGD);  Surgeon: Wilford Corner, MD;  Location: [facility];  Service: Endoscopy;  Laterality: N/A;   HEMOSTASIS CLIP PLACEMENT  12/27/2021   Procedure: HEMOSTASIS CLIP PLACEMENT;  Surgeon: Wilford Corner, MD;  [facility];  Service: Endoscopy;;   HOT HEMOSTASIS N/A 07/14/2014   Procedure: HOT HEMOSTASIS (ARGON PLASMA COAGULATION/BICAP);  Surgeon: Lear Ng, MD;  Location: [facility];  Service: Endoscopy;  Laterality: N/A;   IR FLUORO GUIDE CV LINE RIGHT  12/22/2019   IR US GUIDE VASC ACCESS RIGHT  12/22/2019   KIDNEY TRANSPLANT Right 08/04/2011   ORIF PATELLA Left 04/06/2016   Procedure: OPEN REDUCTION INTERNAL (ORIF) LEFT  PATELLA;  Surgeon: Ninetta Lights, MD;  Location: [facility];  Service: Orthopedics;  Laterality: Left;   POLYPECTOMY  09/30/2019   Procedure: POLYPECTOMY;  Surgeon: Wilford Corner, MD;  [facility];  Service: Endoscopy;;   POLYPECTOMY  12/27/2021   Procedure: POLYPECTOMY;  Surgeon: Wilford Corner, MD;  [facility];  Service: Endoscopy;;   SHOULDER ARTHROSCOPY WITH ROTATOR CUFF REPAIR AND SUBACROMIAL DECOMPRESSION Left 08/03/2016   Procedure: LEFT SHOULDER ARTHROSCOPY WITH ROTATOR CUFF REPAIR AND SUBACROMIAL DECOMPRESSION with distal claviculectomy and extentsive debridement;  Surgeon: Ninetta Lights, MD;  Location: [facility];  Service: Orthopedics;  Laterality: Left;  Block   THROMBECTOMY AND REVISION OF ARTERIOVENTOUS (AV) GORETEX  GRAFT Left 05/08/2007   forearm   TOTAL HIP ARTHROPLASTY  12/18/2012   Procedure: TOTAL HIP ARTHROPLASTY;  Surgeon: Ninetta Lights, MD;  Location: [facility];  Service: Orthopedics;  Laterality: Left;    VESICOVAGINAL FISTULA CLOSURE W/ TAH  1984   Social History:  reports that she quit smoking about 39 years ago. Her smoking use included cigarettes. She has a 8.00 pack-year smoking history. She has never used smokeless tobacco. She reports that she does not drink alcohol and does not use drugs. Family History:  Family History  Problem Relation Age of Onset   Breast cancer Sister 75   Prostate cancer Brother    Lung cancer Brother    Cancer Neg Hx      HOME MEDICATIONS: Allergies as of 01/01/2023       Reactions   Sodium Hypochlorite Shortness Of Breath   Lactose Nausea And Vomiting   Asa Arthritis Strength-antacid [aspirin Buffered] Nausea And Vomiting, Other (See Comments)   STOMACH BURNS, also   Aspirin Nausea And Vomiting   Per pt. "can tolerate the enteric coated tablets".    Atorvastatin Other (See Comments)   Patient's skin was skin was sensitive   Banana Nausea And Vomiting   Lactose Intolerance (gi) Nausea And Vomiting   Lisinopril Cough        Medication List  Accurate as of January 01, 2023 10:04 AM. If you have any questions, ask your nurse or doctor.          acetaminophen 650 MG CR tablet Commonly known as: TYLENOL Take 650 mg by mouth every morning.   albuterol (2.5 MG/3ML) 0.083% nebulizer solution Commonly known as: PROVENTIL Take 2.5 mg by nebulization every 6 (six) hours as needed for wheezing or shortness of breath.   albuterol 108 (90 Base) MCG/ACT inhaler Commonly known as: VENTOLIN HFA Inhale 1-2 puffs into the lungs every 6 (six) hours as needed for wheezing or shortness of breath.   amLODipine 10 MG tablet Commonly known as: NORVASC Take 10 mg by mouth daily.   aspirin EC 81 MG tablet Take 81 mg by mouth daily.   Azelastine HCl 0.15 % Soln Place 1 spray into both nostrils daily as needed for allergies.   budesonide 0.25 MG/2ML nebulizer solution Commonly known as: PULMICORT Inhale 0.25 mg into the lungs as needed.    calcitRIOL 0.25 MCG capsule Commonly known as: ROCALTROL Take 0.25 mcg by mouth daily.   ezetimibe 10 MG tablet Commonly known as: ZETIA TAKE 1 TABLET BY MOUTH EVERY DAY   fluticasone 50 MCG/ACT nasal spray Commonly known as: FLONASE Place into both nostrils.   fluticasone furoate-vilanterol 200-25 MCG/INH Aepb Commonly known as: BREO ELLIPTA Inhale 1 puff into the lungs daily as needed (respiratory issues.).   isosorbide mononitrate 30 MG 24 hr tablet Commonly known as: IMDUR Take 1 tablet (30 mg total) by mouth daily. Please keep schedule appointment for future refills. Thank you.   Klor-Con M20 20 MEQ tablet Generic drug: potassium chloride SA TAKE 1 TABLET BY MOUTH EVERY DAY   metoprolol tartrate 50 MG tablet Commonly known as: LOPRESSOR TAKE 1 TABLET BY MOUTH TWICE A DAY   nitroGLYCERIN 0.4 MG SL tablet Commonly known as: NITROSTAT Place 1 tablet (0.4 mg total) under the tongue every 5 (five) minutes as needed for chest pain.   omeprazole 20 MG capsule Commonly known as: PRILOSEC Take 20 mg by mouth daily after breakfast.   OneTouch Verio test strip Generic drug: glucose blood 1 each by Other route daily in the afternoon. Use as instructed   predniSONE 5 MG tablet Commonly known as: DELTASONE Take 5 mg by mouth 2 (two) times daily.   rosuvastatin 40 MG tablet Commonly known as: CRESTOR Take 1 tablet (40 mg total) by mouth daily. Pt needs to make appt with provider for further refills - 2nd attempt   sennosides-docusate sodium 8.6-50 MG tablet Commonly known as: SENOKOT-S Take 1 tablet by mouth daily as needed (constipation.).   tacrolimus 1 MG capsule Commonly known as: PROGRAF Take 5 mg by mouth See admin instructions. Take 5 mg by mouth at 9 AM and 5 mg at 9 PM   Tiotropium Bromide Monohydrate 1.25 MCG/ACT Aers Inhale 1 puff into the lungs daily.         OBJECTIVE:   Vital Signs: BP 118/76 (BP Location: Left Arm, Patient Position: Sitting,  Cuff Size: Small)   Pulse 73   Ht 5' 2"$  (1.575 m)   Wt 137 lb 12.8 oz (62.5 kg)   LMP 03/04/2022   SpO2 93%   BMI 25.20 kg/m   Wt Readings from Last 3 Encounters:  01/01/23 137 lb 12.8 oz (62.5 kg)  11/07/22 136 lb 7.4 oz (61.9 kg)  10/24/22 136 lb 7.4 oz (61.9 kg)     Exam: General: Pt appears well and is in  NAD  Lungs: Clear with good BS bilat   Heart: RRR   Extremities: No pretibial edema.   Neuro: MS is good with appropriate affect, pt is alert and Ox3    DM foot exam: 07/10/2022  The skin of the feet is intact without sores or ulcerations. The pedal pulses are 1+ on right and 2\1+ on left. The sensation is intact to a screening 5.07, 10 gram monofilament bilaterally          DATA REVIEWED:  Lab Results  Component Value Date   HGBA1C 6.0 (A) 01/01/2023   HGBA1C 6.4 (A) 07/10/2022   HGBA1C 6.1 (A) 02/22/2022     11/09/2022  BUN 23 Cr 1.27 Na 140 K 3.9 GFR 43  ASSESSMENT / PLAN / RECOMMENDATIONS:   1) Type 2 Diabetes Mellitus, Optimally controlled, With CKD III (S/P renal transplant) and macrovascular  complications - Most recent A1c of 6.0 %. Goal A1c < 7.0 %.     -Her A1c is acceptable but I suspect that it is skewed due to CKD -She was  on glipizide which was discontinued in 2019 - NO change    MEDICATIONS: N/a  EDUCATION / INSTRUCTIONS: BG monitoring instructions: Patient is instructed to check her blood sugars 2-3 times a week.   2) Diabetic complications:  Eye: Does not have known diabetic retinopathy.  Neuro/ Feet: Does not have known diabetic peripheral neuropathy .  Renal: Patient does  have known baseline CKD. She   is not on an ACEI/ARB at present.  She is s/p P renal transplant, follows with nephrology     F/U in 6 months     Signed electronically by: Mack Guise, MD  [facility]  [facility] [address] [place], [place] [address] Phone: [phone] FAX:  [phone]   CC: Chesley Noon, MD [address] [place] [address] Phone: [phone]  Fax: [phone]  Return to Endocrinology clinic as below: Future Appointments  Date Time Provider [place]  04/09/2023 10:20 AM Nahser, Wonda Cheng, MD [facility]

## 2023-01-01 NOTE — Patient Instructions (Signed)
Keep Up the Good Work !

## 2023-01-02 ENCOUNTER — Other Ambulatory Visit: Payer: Self-pay

## 2023-01-12 ENCOUNTER — Encounter: Payer: Self-pay | Admitting: Nephrology

## 2023-04-02 ENCOUNTER — Other Ambulatory Visit: Payer: Self-pay | Admitting: Cardiovascular Disease

## 2023-04-07 ENCOUNTER — Encounter: Payer: Self-pay | Admitting: Cardiovascular Disease

## 2023-04-07 NOTE — Progress Notes (Unsigned)
Cardiology Office Note   Date:  04/09/2023   ID:  [name], [name] Oct 10, 1941, MRN [id number]  PCP:  [name], MD  Cardiologist:  [name], MD   Chief Complaint  Patient presents with   Coronary Artery Disease        Hypertension        Problem list 1. CAD - CABG , 2001 2. ESRD - s/p renal transplant - Sept. 2012  3. GI bleed:  4. DM    [name] is a 82 y.o. female who presents  Today to follow-up shortness of breath. I saw her last April, 2015. Before that she had had some difficulties with shortness of breath. We thought that some of her symptoms were related to increased levels of Prograf.  Fortunately she is doing well. She is not having any chest pain or significant shortness of breath.  March 07, 2016:  Doing well.  Previous patient of Dr. Myrtis Ser. No issues.   Some DOE if she walks too far.  No Cp , has not had to take any NTG .  March 14, 2017:  Has had some dyspnea with exertion,  Seems to have worsened over the past month or so Has to stop and rest twice when walking to the mailbox   Has had some allergy issues Stays fatigued.   March 04, 2018:  [name] is seen back today for follow-up of her coronary artery disease. Been started on home oxygen.  She has history of chronic asthma end-stage renal disease, status post renal transplant Was in the hospital in March - was not D/C'd on home O2.  Has developed worsening dyspnea . O2 sats were in the 80s  No CP  O2 sat was 81% when she arrived in the office today  Came up to 90% with O2  2 liters / min   March 04, 2020:  [name] is seen back today for follow-up of her coronary artery disease.  She has a history of coronary artery bypass grafting in 2001.  She has end-stage renal disease and is s/p renal transplant about 9 years.   Avoids salt ,  Fried foods  She had lipids drawn at Dr. Fortunato Curling office in February.  Her total cholesterol is 140.  The triglyceride level is 142.   The HDL is 54.  The LDL is 62.  Complete metabolic profile drawn at that time reveals a creatinine of 1.57.  The sodium is 141.  The potassium is 4.6.  Liver enzymes look normal.   Sept. 12, 2022  [name] is seen today for follow up of CAD, CABG, ESRD, HLD BP is a bit elevated - has not taken her meds Coughs with swallowing fairly frequently  Concerning for aspiration .  Avoids salt and salty foods    Nov. 14, 2023 [name] is seen for follow up of her CAD, CABG, ESRD, HLD  Has severe astham Going to pulmonary rehab   No CP ,  Has indigestion like disfomfort ,  very similar to her presenting angina   Will get a lexiscan myoview  Heart burn is very frequent ,  is not worsened by exercise .  Is on omeprazole  On HD in the past Now has a renal transplant   Apr 09, 2023 [name] is seen for follow up of her CAD , CABG, ESRD, HLD Has severe asthma Has renal transplant in 2012.  No CP , Has severe DOE Pants qute a bit with  any exertion  Lexiscan myoview in Nov. 2023 showed no ischema  Prior inferoapical MI   Previous echocardiogram was in March, 2019.  She had normal left ventricular systolic function.  She had grade 1 diastolic dysfunction. RV was normal in size and function. She now uses 4 Lpm of oxygen at home.  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, hold ASA 2-3 weeks - consider plavix instead of ASA   Asthma    Cellulitis 12/19/2019   right upper arm   Coronary artery disease    cath 11/2007, grafts patent /  Nuclear, June, 2011, prior inferior MI with mild peri-infarct ischemia, anterior breast attenuation, EF 67%, done for renal transplant assessment / Low level exercise/Lexiscan Myoview (11/2013): EF 67%, no ischemi; normal study   Diabetes mellitus    type 2   ESRD on dialysis [facility])    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, 2012    Family history of breast cancer    Family history of prostate cancer    GERD (gastroesophageal reflux disease)    GI bleed 11/26/2010   January, 2012 , AVM   Gout    History of methicillin resistant staphylococcus aureus (MRSA)    Hyperlipidemia    Hyperparathyroidism    Hypertension    Myocardial infarction [facility])    Osteoporosis    Pneumonia 08/2010    Hospitalization, October, 2011   Primary osteoarthritis, 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   S/P kidney transplant    September, 2012, [facility]   Subacromial impingement of 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    Past Surgical History:  Procedure Laterality Date   ABDOMINAL HYSTERECTOMY  1980'S   ARTERIOVENOUS GRAFT PLACEMENT Left 03/04/2007   forearm   BIOPSY  09/30/2019   Procedure: BIOPSY;  Surgeon: Charlott Rakes, MD;  Location: [facility];  Service: Endoscopy;;   BIOPSY  12/27/2021   Procedure: BIOPSY;  Surgeon: Charlott Rakes, MD;  Location: [facility];  Service: Endoscopy;;   BREAST EXCISIONAL BIOPSY Right    benign more than 10 yr ago   BREAST SURGERY  YRS AGO   RT BREAST CYST REMOVED    CARDIAC CATHETERIZATION  06/03/2002; 12/04/2007   COLONOSCOPY WITH PROPOFOL N/A 07/14/2014   Procedure: COLONOSCOPY WITH PROPOFOL;  Surgeon: Shirley Friar, MD;  Location: [facility];  Service: Endoscopy;  Laterality: N/A;   COLONOSCOPY WITH PROPOFOL N/A 09/30/2019   Procedure: COLONOSCOPY WITH PROPOFOL;  Surgeon: Charlott Rakes, MD;  Location: [facility];  Service: Endoscopy;  Laterality: N/A;   COLONOSCOPY WITH PROPOFOL N/A 12/27/2021   Procedure: COLONOSCOPY WITH PROPOFOL;  Surgeon: Charlott Rakes, MD;  Location: [facility];  Service: Endoscopy;  Laterality: N/A;   CORONARY ARTERY BYPASS GRAFT  06/06/2002   x 4   ESOPHAGOGASTRODUODENOSCOPY N/A 12/27/2021   Procedure: ESOPHAGOGASTRODUODENOSCOPY (EGD);  Surgeon: Charlott Rakes, MD;  Location: [facility];  Service: Endoscopy;  Laterality: N/A;   HEMOSTASIS CLIP PLACEMENT  12/27/2021    Procedure: HEMOSTASIS CLIP PLACEMENT;  Surgeon: Charlott Rakes, MD;  Location: [facility];  Service: Endoscopy;;   HOT HEMOSTASIS N/A 07/14/2014   Procedure: HOT HEMOSTASIS (ARGON PLASMA COAGULATION/BICAP);  Surgeon: Shirley Friar, MD;  Location: [facility];  Service: Endoscopy;  Laterality: N/A;   IR FLUORO GUIDE CV LINE RIGHT  12/22/2019   IR US GUIDE VASC ACCESS RIGHT  12/22/2019  KIDNEY TRANSPLANT Right 08/04/2011   ORIF PATELLA Left 04/06/2016   Procedure: OPEN REDUCTION INTERNAL (ORIF) LEFT  PATELLA;  Surgeon: Loreta Ave, MD;  Location: [facility];  Service: Orthopedics;  Laterality: Left;   POLYPECTOMY  09/30/2019   Procedure: POLYPECTOMY;  Surgeon: Charlott Rakes, MD;  Location: [facility];  Service: Endoscopy;;   POLYPECTOMY  12/27/2021   Procedure: POLYPECTOMY;  Surgeon: Charlott Rakes, MD;  Location: [facility];  Service: Endoscopy;;   SHOULDER ARTHROSCOPY WITH ROTATOR CUFF REPAIR AND SUBACROMIAL DECOMPRESSION Left 08/03/2016   Procedure: LEFT SHOULDER ARTHROSCOPY WITH ROTATOR CUFF REPAIR AND SUBACROMIAL DECOMPRESSION with distal claviculectomy and extentsive debridement;  Surgeon: Loreta Ave, MD;  Location: [facility];  Service: Orthopedics;  Laterality: Left;  Block   THROMBECTOMY AND REVISION OF ARTERIOVENTOUS (AV) GORETEX  GRAFT Left 05/08/2007   forearm   TOTAL HIP ARTHROPLASTY  12/18/2012   Procedure: TOTAL HIP ARTHROPLASTY;  Surgeon: Loreta Ave, MD;  Location: [facility];  Service: Orthopedics;  Laterality: Left;   VESICOVAGINAL FISTULA CLOSURE W/ TAH  1984    Patient Active Problem List   Diagnosis Date Noted   Coronary artery disease     Priority: High   History of renal transplantation 07/10/2022   Asymptomatic menopausal state 11/03/2021   Olecranon bursitis of right elbow 12/26/2019   Right arm cellulitis 12/18/2019   Fever    Hx of colonic polyps 09/30/2019   Family history of breast cancer    Family history  of prostate cancer    Family history of lung cancer 08/08/2018   Shoulder pain, right 04/04/2018   Severe persistent asthma with (acute) exacerbation 02/13/2018   GERD (gastroesophageal reflux disease) 02/12/2018   Severe persistent asthma with acute exacerbation 02/12/2018   Complete rotator cuff tear of 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   Subacromial impingement of 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   Primary osteoarthritis, 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   History of methicillin resistant staphylococcus aureus (MRSA)    Benign neoplasm of colon 07/14/2014   Nausea & vomiting 12/22/2013   Dyspnea 12/22/2013   Diabetes (HCC) 02/28/2013   Preop cardiovascular exam    Type II or unspecified type diabetes mellitus without mention of complication, not stated as uncontrolled 06/05/2012   Encounter for long-term (current) use of other medications 02/26/2012   Type II or unspecified type diabetes mellitus with renal manifestations, not stated as uncontrolled(250.40) 02/26/2012   S/P kidney transplant    H/O steroid therapy    Anemia    Hyperlipidemia    Hypertension    Aspirin allergy    Hx of CABG    Ejection fraction    Anemia    GI bleed 11/26/2010   Pneumonia 08/20/2010   FOOT PAIN 01/20/2010   THROMBOCYTOPENIA 01/11/2009   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     albuterol (PROVENTIL) (2.5 MG/3ML) 0.083% nebulizer solution Take 2.5 mg by nebulization every 6 (six) hours as needed for wheezing or shortness of breath.     amLODipine (NORVASC) 10 MG tablet Take 10 mg by mouth daily.      aspirin EC 81 MG tablet Take 81 mg by mouth daily.     Azelastine HCl 0.15 %  SOLN Place 1 spray into both  nostrils daily as needed for allergies.     calcitRIOL (ROCALTROL) 0.25 MCG capsule Take 0.25 mcg by mouth daily.      ezetimibe (ZETIA) 10 MG tablet TAKE 1 TABLET BY MOUTH EVERY DAY 90 tablet 1   fluticasone (FLONASE) 50 MCG/ACT nasal spray Place into both nostrils.     fluticasone furoate-vilanterol (BREO ELLIPTA) 200-25 MCG/INH AEPB Inhale 1 puff into the lungs daily as needed (respiratory issues.).      glucose blood (ONETOUCH VERIO) test strip 1 each by Other route daily in the afternoon. Use as instructed 100 each 3   isosorbide mononitrate (IMDUR) 30 MG 24 hr tablet Take 1 tablet (30 mg total) by mouth daily. Please keep schedule appointment for future refills. Thank you. 90 tablet 1   metoprolol tartrate (LOPRESSOR) 50 MG tablet TAKE 1 TABLET BY MOUTH TWICE A DAY 180 tablet 3   nitroGLYCERIN (NITROSTAT) 0.4 MG SL tablet Place 1 tablet (0.4 mg total) under the tongue every 5 (five) minutes as needed for chest pain. 25 tablet 1   omeprazole (PRILOSEC) 20 MG capsule Take 20 mg by mouth daily after breakfast.     potassium chloride SA (KLOR-CON M20) 20 MEQ tablet TAKE 1 TABLET BY MOUTH EVERY DAY 90 tablet 3   predniSONE (DELTASONE) 5 MG tablet Take 5 mg by mouth 2 (two) times daily.     rosuvastatin (CRESTOR) 40 MG tablet Take 1 tablet (40 mg total) by mouth daily. Pt needs to make appt with provider for further refills - 2nd attempt 15 tablet 0   sennosides-docusate sodium (SENOKOT-S) 8.6-50 MG tablet Take 1 tablet by mouth daily as needed (constipation.).      tacrolimus (PROGRAF) 1 MG capsule Take 5 mg by mouth See admin instructions. Take 5 mg by mouth at 9 AM and 5 mg at 9 PM     Tiotropium Bromide Monohydrate 1.25 MCG/ACT AERS Inhale 1 puff into the lungs daily.     albuterol (VENTOLIN HFA) 108 (90 Base) MCG/ACT inhaler Inhale 1-2 puffs into the lungs every 6 (six) hours as needed for wheezing or shortness of breath. (Patient not taking: Reported on 04/09/2023)     budesonide (PULMICORT)  0.25 MG/2ML nebulizer solution Inhale 0.25 mg into the lungs as needed. (Patient not taking: Reported on 04/09/2023)     No current facility-administered medications for this visit.    Allergies:   Sodium hypochlorite, Lactose, Asa arthritis strength-antacid [aspirin buffered], Aspirin, Atorvastatin, Banana, Lactose intolerance (gi), and Lisinopril    Social History:  The patient  reports that she quit smoking about 39 years ago. Her smoking use included cigarettes. She has a 8.00 pack-year smoking history. She has never used smokeless tobacco. She reports that she does not drink alcohol and does not use drugs.   Family History:  The patient's family history includes Breast cancer (age of onset: 60) in her sister; Lung cancer in her brother; Prostate cancer in her brother.    ROS:  Please see the history of present illness.      Patient denies fever, chills, headache, sweats, rash, change in vision, change in hearing, chest pain, cough, nausea or vomiting, urinary symptoms. All other systems are reviewed and are negative.  Physical Exam: Blood pressure 130/60, pulse 73, height 5\' 2"  (1.575 m), weight 139 lb 9.6 oz (63.3 kg), last menstrual period 03/04/2022, SpO2 96 %.       GEN:  Well nourished, well developed in no acute  distress HEENT: Normal NECK: No JVD; No carotid bruits LYMPHATICS: No lymphadenopathy CARDIAC: RRR , no significant murmur heard   RESPIRATORY:  slightly wheezing at rest.  Had significant wheezing while walking back to the exam room  ABDOMEN: Soft, non-tender, non-distended MUSCULOSKELETAL:  No edema; No deformity  SKIN: Warm and dry NEUROLOGIC:  Alert and oriented x 3   EKG:    Recent Labs: 07/10/2022: BUN 24; Creatinine, Ser 1.61; Potassium 3.5; Sodium 141; TSH 2.07    Lipid Panel    Component Value Date/Time   CHOL 138 10/17/2021 0847   TRIG 82 10/17/2021 0847   HDL 65 10/17/2021 0847   CHOLHDL 2.1 10/17/2021 0847   CHOLHDL 2.6 02/14/2018 0223    VLDL 20 02/14/2018 0223   LDLCALC 57 10/17/2021 0847   LDLDIRECT 136.3 02/26/2012 1715      Wt Readings from Last 3 Encounters:  04/09/23 139 lb 9.6 oz (63.3 kg)  01/01/23 137 lb 12.8 oz (62.5 kg)  11/07/22 136 lb 7.4 oz (61.9 kg)      Current medicines are reviewed   Patient understands her medications well.     ASSESSMENT AND PLAN:  1. CAD - CABG , 2001 -Myoview study showed no ischemia last follow-up.       2. Shortness breath with exertion/fatigue: She still has profound dyspnea on exertion.  Will get an echocardiogram.  Her last echo was 5 years ago which revealed normal left ventricular systolic function.  She had grade 1 diastolic dysfunction.   3. ESRD - s/p renal transplant.       4.  Hyperlipidemia:   5. DM  -  6.  HTN: Blood pressure is fairly well-controlled.  Continue current medications.    [name], MD  04/09/2023 10:38 AM    [facility] [address] [place],  [address] [place], [place]  [address] Pager [phone] Phone: [phone]; Fax: [phone]

## 2023-04-09 ENCOUNTER — Ambulatory Visit: Payer: 59 | Attending: Cardiovascular Disease | Admitting: Cardiovascular Disease

## 2023-04-09 ENCOUNTER — Encounter: Payer: Self-pay | Admitting: Cardiovascular Disease

## 2023-04-09 VITALS — BP 130/60 | HR 73 | Ht 62.0 in | Wt 139.6 lb

## 2023-04-09 DIAGNOSIS — J449 Chronic obstructive pulmonary disease, unspecified: Secondary | ICD-10-CM

## 2023-04-09 DIAGNOSIS — I5022 Chronic systolic (congestive) heart failure: Secondary | ICD-10-CM | POA: Diagnosis not present

## 2023-04-09 NOTE — Patient Instructions (Signed)
Medication Instructions:  Your physician recommends that you continue on your current medications as directed. Please refer to the Current Medication list given to you today.  *If you need a refill on your cardiac medications before your next appointment, please call your pharmacy*   Testing/Procedures: Your physician has requested that you have an echocardiogram. Echocardiography is a painless test that uses sound waves to create images of your heart. It provides your doctor with information about the size and shape of your heart and how well your heart's chambers and valves are working. This procedure takes approximately one hour. There are no restrictions for this procedure. Please do NOT wear cologne, perfume, aftershave, or lotions (deodorant is allowed). Please arrive 15 minutes prior to your appointment time.    Follow-Up: At [facility], you and your health needs are our priority.  As part of our continuing mission to provide you with exceptional heart care, we have created designated Provider Care Teams.  These Care Teams include your primary Cardiologist (physician) and Advanced Practice Providers (APPs -  Physician Assistants and Nurse Practitioners) who all work together to provide you with the care you need, when you need it.   Your next appointment:   6 month(s)  Provider:   Philip Nahser, MD      

## 2023-04-12 ENCOUNTER — Other Ambulatory Visit: Payer: Self-pay | Admitting: Family Medicine

## 2023-04-12 DIAGNOSIS — Z0000 Encounter for general adult medical examination without abnormal findings: Secondary | ICD-10-CM

## 2023-05-10 ENCOUNTER — Ambulatory Visit (HOSPITAL_COMMUNITY): Payer: 59 | Attending: Cardiovascular Disease

## 2023-05-10 DIAGNOSIS — I5022 Chronic systolic (congestive) heart failure: Secondary | ICD-10-CM | POA: Insufficient documentation

## 2023-05-10 DIAGNOSIS — R0602 Shortness of breath: Secondary | ICD-10-CM | POA: Diagnosis not present

## 2023-05-10 LAB — ECHOCARDIOGRAM COMPLETE
Area-P 1/2: 5.27 cm2
P 1/2 time: 508 msec
S' Lateral: 2.7 cm

## 2023-05-17 ENCOUNTER — Ambulatory Visit
Admission: RE | Admit: 2023-05-17 | Discharge: 2023-05-17 | Disposition: A | Discharge status: Home / Self Care | Payer: 59 | Source: Ambulatory Visit | Attending: Family Medicine | Admitting: Family Medicine

## 2023-05-17 DIAGNOSIS — Z0000 Encounter for general adult medical examination without abnormal findings: Secondary | ICD-10-CM

## 2023-07-02 ENCOUNTER — Other Ambulatory Visit: Payer: Self-pay | Admitting: Internal Medicine

## 2023-07-03 ENCOUNTER — Ambulatory Visit: Payer: 59 | Admitting: Internal Medicine

## 2023-07-03 NOTE — Progress Notes (Deleted)
Name: [name]  Age/ Sex: 82 y.o., female   MRN/ DOB: [id number], 1940-11-29     PCP: [name], MD   Reason for Endocrinology Evaluation: Type 2 Diabetes Mellitus  Initial Endocrine Consultative Visit: 02/28/2013    PATIENT IDENTIFIER: Ms. [name] is a 82 y.o. female with a past medical history of T2DM, CAD, Hx of ESRD ( S/P transplant 2013) . The patient has followed with Endocrinology clinic since 02/28/2013 for consultative assistance with management of her diabetes.  DIABETIC HISTORY:  [name] was diagnosed with DM 2013.  She was on oral glycemic agents such as glipizide (discontinued in 2019) Her hemoglobin A1c has ranged from 5.5% in 2014, peaking at 6.5% in 2022.  She was on Glipizide but discontinued in 2019  She was followed by [name] from 2014 until 02/2022    SUBJECTIVE:   During the last visit (01/01/2023): A1c 6.4%  Today (07/03/2023): [name] is here for a follow up on diabetes management .  She checks her blood sugars occasionally.   She continues to follow-up with pulmonary for asthma  She had a follow-up with cardiology 03/2023 for CAD, CABG  she had a follow-up with renal transplant team through [facility] with nephrology Dr. 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, hold ASA 2-3 weeks - consider plavix instead of ASA   Asthma    Cellulitis 12/19/2019   right upper arm   Coronary artery  disease    cath 11/2007, grafts patent /  Nuclear, June, 2011, prior inferior MI with mild peri-infarct ischemia, anterior breast attenuation, EF 67%, done for renal transplant assessment / Low level exercise/Lexiscan Myoview (11/2013): EF 67%, no ischemi; normal study   Diabetes mellitus    type 2   ESRD on dialysis [facility])    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, 2012   Family history of breast cancer    Family history of prostate cancer    GERD (gastroesophageal reflux disease)    GI bleed 11/26/2010   January, 2012 , AVM   Gout    History of methicillin resistant staphylococcus aureus (MRSA)    Hyperlipidemia    Hyperparathyroidism    Hypertension    Myocardial infarction [facility])    Osteoporosis    Pneumonia 08/2010    Hospitalization, October, 2011   Primary osteoarthritis, 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   S/P kidney transplant    September, 2012, [facility]   Subacromial impingement of 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   Past Surgical History:  Past Surgical History:  Procedure Laterality Date   ABDOMINAL HYSTERECTOMY  1980'S   ARTERIOVENOUS GRAFT PLACEMENT Left 03/04/2007   forearm   BIOPSY  09/30/2019   Procedure: BIOPSY;  Surgeon: Charlott Rakes, MD;  Location: [facility];  Service: Endoscopy;;   BIOPSY  12/27/2021   Procedure: BIOPSY;  Surgeon: Charlott Rakes, MD;  Location: [facility];  Service: Endoscopy;;   BREAST EXCISIONAL BIOPSY Right    benign more than  10 yr ago   BREAST SURGERY  YRS AGO   RT BREAST CYST REMOVED    CARDIAC CATHETERIZATION  06/03/2002; 12/04/2007   COLONOSCOPY WITH PROPOFOL N/A 07/14/2014   Procedure: COLONOSCOPY WITH PROPOFOL;  Surgeon: Shirley Friar, MD;  Location: [facility];  Service: Endoscopy;  Laterality: N/A;   COLONOSCOPY WITH PROPOFOL N/A 09/30/2019   Procedure: COLONOSCOPY WITH PROPOFOL;  Surgeon: Charlott Rakes, MD;  Location: [facility];  Service: Endoscopy;  Laterality: N/A;   COLONOSCOPY WITH PROPOFOL N/A 12/27/2021   Procedure: COLONOSCOPY WITH  PROPOFOL;  Surgeon: Charlott Rakes, MD;  Location: [facility];  Service: Endoscopy;  Laterality: N/A;   CORONARY ARTERY BYPASS GRAFT  06/06/2002   x 4   ESOPHAGOGASTRODUODENOSCOPY N/A 12/27/2021   Procedure: ESOPHAGOGASTRODUODENOSCOPY (EGD);  Surgeon: Charlott Rakes, MD;  Location: [facility];  Service: Endoscopy;  Laterality: N/A;   HEMOSTASIS CLIP PLACEMENT  12/27/2021   Procedure: HEMOSTASIS CLIP PLACEMENT;  Surgeon: Charlott Rakes, MD;  Location: [facility];  Service: Endoscopy;;   HOT HEMOSTASIS N/A 07/14/2014   Procedure: HOT HEMOSTASIS (ARGON PLASMA COAGULATION/BICAP);  Surgeon: Shirley Friar, MD;  Location: [facility];  Service: Endoscopy;  Laterality: N/A;   IR FLUORO GUIDE CV LINE RIGHT  12/22/2019   IR US GUIDE VASC ACCESS RIGHT  12/22/2019   KIDNEY TRANSPLANT Right 08/04/2011   ORIF PATELLA Left 04/06/2016   Procedure: OPEN REDUCTION INTERNAL (ORIF) LEFT  PATELLA;  Surgeon: Loreta Ave, MD;  Location: [facility];  Service: Orthopedics;  Laterality: Left;   POLYPECTOMY  09/30/2019   Procedure: POLYPECTOMY;  Surgeon: Charlott Rakes, MD;  Location: [facility];  Service: Endoscopy;;   POLYPECTOMY  12/27/2021   Procedure: POLYPECTOMY;  Surgeon: Charlott Rakes, MD;  Location: [facility];  Service: Endoscopy;;   SHOULDER ARTHROSCOPY WITH ROTATOR CUFF REPAIR AND SUBACROMIAL DECOMPRESSION Left 08/03/2016   Procedure: LEFT SHOULDER ARTHROSCOPY WITH ROTATOR CUFF REPAIR AND SUBACROMIAL DECOMPRESSION with distal claviculectomy and extentsive debridement;  Surgeon: Loreta Ave, MD;  Location:  SURGERY CENTER;  Service: Orthopedics;  Laterality: Left;  Block   THROMBECTOMY AND REVISION OF ARTERIOVENTOUS (AV) GORETEX  GRAFT Left 05/08/2007   forearm   TOTAL HIP ARTHROPLASTY  12/18/2012   Procedure: TOTAL HIP ARTHROPLASTY;  Surgeon: Loreta Ave, MD;  Location[facility];  Service: Orthopedics;  Laterality: Left;   VESICOVAGINAL FISTULA CLOSURE W/  TAH  1984   Social History:  reports that she quit smoking about 39 years ago. Her smoking use included cigarettes. She started smoking about 47 years ago. She has a 8 pack-year smoking history. She has never used smokeless tobacco. She reports that she does not drink alcohol and does not use drugs. Family History:  Family History  Problem Relation Age of Onset   Prostate cancer Brother    Lung cancer Brother    Cancer Neg Hx      HOME MEDICATIONS: Allergies as of 07/03/2023       Reactions   Sodium Hypochlorite Shortness Of Breath   Lactose Nausea And Vomiting   Asa Arthritis Strength-antacid [aspirin Buffered] Nausea And Vomiting, Other (See Comments)   STOMACH BURNS, also   Aspirin Nausea And Vomiting   Per pt. "can tolerate the enteric coated tablets".    Atorvastatin Other (See Comments)   Patient's skin was skin was sensitive   Banana Nausea And Vomiting   Lactose Intolerance (gi) Nausea And Vomiting   Lisinopril Cough        Medication List  Accurate as of July 03, 2023  7:05 AM. If you have any questions, ask your nurse or doctor.          acetaminophen 650 MG CR tablet Commonly known as: TYLENOL Take 650 mg by mouth every morning.   albuterol (2.5 MG/3ML) 0.083% nebulizer solution Commonly known as: PROVENTIL Take 2.5 mg by nebulization every 6 (six) hours as needed for wheezing or shortness of breath.   albuterol 108 (90 Base) MCG/ACT inhaler Commonly known as: VENTOLIN HFA Inhale 1-2 puffs into the lungs every 6 (six) hours as needed for wheezing or shortness of breath.   amLODipine 10 MG tablet Commonly known as: NORVASC Take 10 mg by mouth daily.   aspirin EC 81 MG tablet Take 81 mg by mouth daily.   Azelastine HCl 0.15 % Soln Place 1 spray into both nostrils daily as needed for allergies.   budesonide 0.25 MG/2ML nebulizer solution Commonly known as: PULMICORT Inhale 0.25 mg into the lungs as needed.   calcitRIOL 0.25 MCG  capsule Commonly known as: ROCALTROL Take 0.25 mcg by mouth daily.   ezetimibe 10 MG tablet Commonly known as: ZETIA TAKE 1 TABLET BY MOUTH EVERY DAY   fluticasone 50 MCG/ACT nasal spray Commonly known as: FLONASE Place into both nostrils.   fluticasone furoate-vilanterol 200-25 MCG/INH Aepb Commonly known as: BREO ELLIPTA Inhale 1 puff into the lungs daily as needed (respiratory issues.).   isosorbide mononitrate 30 MG 24 hr tablet Commonly known as: IMDUR Take 1 tablet (30 mg total) by mouth daily. Please keep schedule appointment for future refills. Thank you.   Klor-Con M20 20 MEQ tablet Generic drug: potassium chloride SA TAKE 1 TABLET BY MOUTH EVERY DAY   metoprolol tartrate 50 MG tablet Commonly known as: LOPRESSOR TAKE 1 TABLET BY MOUTH TWICE A DAY   nitroGLYCERIN 0.4 MG SL tablet Commonly known as: NITROSTAT Place 1 tablet (0.4 mg total) under the tongue every 5 (five) minutes as needed for chest pain.   omeprazole 20 MG capsule Commonly known as: PRILOSEC Take 20 mg by mouth daily after breakfast.   OneTouch Verio test strip Generic drug: glucose blood 1 EACH BY OTHER ROUTE DAILY IN THE AFTERNOON. USE AS INSTRUCTED   predniSONE 5 MG tablet Commonly known as: DELTASONE Take 5 mg by mouth 2 (two) times daily.   rosuvastatin 40 MG tablet Commonly known as: CRESTOR Take 1 tablet (40 mg total) by mouth daily. Pt needs to make appt with provider for further refills - 2nd attempt   sennosides-docusate sodium 8.6-50 MG tablet Commonly known as: SENOKOT-S Take 1 tablet by mouth daily as needed (constipation.).   tacrolimus 1 MG capsule Commonly known as: PROGRAF Take 5 mg by mouth See admin instructions. Take 5 mg by mouth at 9 AM and 5 mg at 9 PM   Tiotropium Bromide Monohydrate 1.25 MCG/ACT Aers Inhale 1 puff into the lungs daily.         OBJECTIVE:   Vital Signs: LMP 03/04/2022   Wt Readings from Last 3 Encounters:  04/09/23 139 lb 9.6 oz (63.3  kg)  01/01/23 137 lb 12.8 oz (62.5 kg)  11/07/22 136 lb 7.4 oz (61.9 kg)     Exam: General: Pt appears well and is in NAD  Lungs: Clear with good BS bilat   Heart: RRR   Extremities: No pretibial edema.   Neuro: MS is good with appropriate affect, pt is alert and Ox3    DM foot exam: 07/10/2022  The skin  of the feet is intact without sores or ulcerations. The pedal pulses are 1+ on right and 2\1+ on left. The sensation is intact to a screening 5.07, 10 gram monofilament bilaterally          DATA REVIEWED:  Lab Results  Component Value Date   HGBA1C 6.0 (A) 01/01/2023   HGBA1C 6.4 (A) 07/10/2022   HGBA1C 6.1 (A) 02/22/2022    04/06/2023@ [facility]    BUN 29 CR 1.84 GFR 27 LDL 146  Tg 142  HDL 87 Ur Pt/cr ratio 1334  ASSESSMENT / PLAN / RECOMMENDATIONS:   1) Type 2 Diabetes Mellitus, Optimally controlled, With CKD III (S/P renal transplant) and macrovascular  complications - Most recent A1c of 6.0 %. Goal A1c < 7.0 %.     -Her A1c is acceptable but I suspect that it is skewed due to CKD -She was  on glipizide which was discontinued in 2019 - NO change    MEDICATIONS: N/a  EDUCATION / INSTRUCTIONS: BG monitoring instructions: Patient is instructed to check her blood sugars 2-3 times a week.   2) Diabetic complications:  Eye: Does not have known diabetic retinopathy.  Neuro/ Feet: Does not have known diabetic peripheral neuropathy .  Renal: Patient does  have known baseline CKD. She   is not on an ACEI/ARB at present.  She is s/p P renal transplant, follows with nephrology     F/U in 6 months     Signed electronically by: Lyndle Herrlich, MD  [facility] End[facility]  [facility] [address] [place], [place] [address] Phone: [phone] FAX: [phone]   CC: [name], MD [address] [place] [address] Phone: [phone]  Fax: [phone]  Return to Endocrinology  clinic as below: Future Appointments  Date Time Provider Department Center  07/03/2023 10:50 AM , Konrad Dolores, MD [id number] None  10/08/2023 10:20 AM Nahser, Deloris Ping, MD [facility]

## 2023-07-24 ENCOUNTER — Other Ambulatory Visit: Payer: Self-pay | Admitting: Cardiovascular Disease

## 2023-08-05 ENCOUNTER — Encounter (HOSPITAL_COMMUNITY): Payer: Self-pay

## 2023-08-05 ENCOUNTER — Other Ambulatory Visit: Payer: Self-pay

## 2023-08-05 ENCOUNTER — Emergency Department (HOSPITAL_COMMUNITY): Payer: 59

## 2023-08-05 ENCOUNTER — Inpatient Hospital Stay (HOSPITAL_COMMUNITY)
Admission: EM | Admit: 2023-08-05 | Discharge: 2023-08-08 | DRG: 190 | Disposition: A | Discharge status: Home / Self Care | Payer: 59 | Attending: Internal Medicine | Admitting: Internal Medicine

## 2023-08-05 DIAGNOSIS — Z7951 Long term (current) use of inhaled steroids: Secondary | ICD-10-CM

## 2023-08-05 DIAGNOSIS — Z9981 Dependence on supplemental oxygen: Secondary | ICD-10-CM

## 2023-08-05 DIAGNOSIS — J441 Chronic obstructive pulmonary disease with (acute) exacerbation: Principal | ICD-10-CM | POA: Diagnosis present

## 2023-08-05 DIAGNOSIS — Z8614 Personal history of Methicillin resistant Staphylococcus aureus infection: Secondary | ICD-10-CM

## 2023-08-05 DIAGNOSIS — R0600 Dyspnea, unspecified: Principal | ICD-10-CM

## 2023-08-05 DIAGNOSIS — Z886 Allergy status to analgesic agent status: Secondary | ICD-10-CM

## 2023-08-05 DIAGNOSIS — Z8249 Family history of ischemic heart disease and other diseases of the circulatory system: Secondary | ICD-10-CM

## 2023-08-05 DIAGNOSIS — D84821 Immunodeficiency due to drugs: Secondary | ICD-10-CM | POA: Diagnosis present

## 2023-08-05 DIAGNOSIS — T8619 Other complication of kidney transplant: Secondary | ICD-10-CM | POA: Diagnosis present

## 2023-08-05 DIAGNOSIS — Z801 Family history of malignant neoplasm of trachea, bronchus and lung: Secondary | ICD-10-CM

## 2023-08-05 DIAGNOSIS — Z1152 Encounter for screening for COVID-19: Secondary | ICD-10-CM

## 2023-08-05 DIAGNOSIS — Z940 Kidney transplant status: Secondary | ICD-10-CM

## 2023-08-05 DIAGNOSIS — Z96642 Presence of left artificial hip joint: Secondary | ICD-10-CM | POA: Diagnosis present

## 2023-08-05 DIAGNOSIS — I252 Old myocardial infarction: Secondary | ICD-10-CM

## 2023-08-05 DIAGNOSIS — Z7960 Long term (current) use of unspecified immunomodulators and immunosuppressants: Secondary | ICD-10-CM

## 2023-08-05 DIAGNOSIS — K5900 Constipation, unspecified: Secondary | ICD-10-CM | POA: Diagnosis present

## 2023-08-05 DIAGNOSIS — Z8042 Family history of malignant neoplasm of prostate: Secondary | ICD-10-CM

## 2023-08-05 DIAGNOSIS — R0602 Shortness of breath: Secondary | ICD-10-CM | POA: Diagnosis not present

## 2023-08-05 DIAGNOSIS — K219 Gastro-esophageal reflux disease without esophagitis: Secondary | ICD-10-CM | POA: Diagnosis present

## 2023-08-05 DIAGNOSIS — E785 Hyperlipidemia, unspecified: Secondary | ICD-10-CM | POA: Diagnosis present

## 2023-08-05 DIAGNOSIS — E1122 Type 2 diabetes mellitus with diabetic chronic kidney disease: Secondary | ICD-10-CM | POA: Diagnosis present

## 2023-08-05 DIAGNOSIS — Z833 Family history of diabetes mellitus: Secondary | ICD-10-CM

## 2023-08-05 DIAGNOSIS — Z7982 Long term (current) use of aspirin: Secondary | ICD-10-CM

## 2023-08-05 DIAGNOSIS — Z79899 Other long term (current) drug therapy: Secondary | ICD-10-CM

## 2023-08-05 DIAGNOSIS — Z803 Family history of malignant neoplasm of breast: Secondary | ICD-10-CM

## 2023-08-05 DIAGNOSIS — Z951 Presence of aortocoronary bypass graft: Secondary | ICD-10-CM

## 2023-08-05 DIAGNOSIS — N179 Acute kidney failure, unspecified: Secondary | ICD-10-CM | POA: Diagnosis present

## 2023-08-05 DIAGNOSIS — N1831 Chronic kidney disease, stage 3a: Secondary | ICD-10-CM | POA: Diagnosis present

## 2023-08-05 DIAGNOSIS — I129 Hypertensive chronic kidney disease with stage 1 through stage 4 chronic kidney disease, or unspecified chronic kidney disease: Secondary | ICD-10-CM | POA: Diagnosis present

## 2023-08-05 DIAGNOSIS — Z888 Allergy status to other drugs, medicaments and biological substances status: Secondary | ICD-10-CM

## 2023-08-05 DIAGNOSIS — D631 Anemia in chronic kidney disease: Secondary | ICD-10-CM | POA: Diagnosis present

## 2023-08-05 DIAGNOSIS — Z90710 Acquired absence of both cervix and uterus: Secondary | ICD-10-CM

## 2023-08-05 DIAGNOSIS — M19012 Primary osteoarthritis, left shoulder: Secondary | ICD-10-CM | POA: Diagnosis present

## 2023-08-05 DIAGNOSIS — Z66 Do not resuscitate: Secondary | ICD-10-CM | POA: Diagnosis present

## 2023-08-05 DIAGNOSIS — Z91018 Allergy to other foods: Secondary | ICD-10-CM

## 2023-08-05 DIAGNOSIS — Y830 Surgical operation with transplant of whole organ as the cause of abnormal reaction of the patient, or of later complication, without mention of misadventure at the time of the procedure: Secondary | ICD-10-CM | POA: Diagnosis present

## 2023-08-05 DIAGNOSIS — Z87891 Personal history of nicotine dependence: Secondary | ICD-10-CM

## 2023-08-05 DIAGNOSIS — I2510 Atherosclerotic heart disease of native coronary artery without angina pectoris: Secondary | ICD-10-CM | POA: Diagnosis present

## 2023-08-05 DIAGNOSIS — M810 Age-related osteoporosis without current pathological fracture: Secondary | ICD-10-CM | POA: Diagnosis present

## 2023-08-05 DIAGNOSIS — Z7952 Long term (current) use of systemic steroids: Secondary | ICD-10-CM

## 2023-08-05 DIAGNOSIS — E739 Lactose intolerance, unspecified: Secondary | ICD-10-CM | POA: Diagnosis present

## 2023-08-05 DIAGNOSIS — J9621 Acute and chronic respiratory failure with hypoxia: Secondary | ICD-10-CM | POA: Diagnosis present

## 2023-08-05 LAB — I-STAT VENOUS BLOOD GAS, ED
Acid-base deficit: 3 mmol/L — ABNORMAL HIGH (ref 0.0–2.0)
Bicarbonate: 22.3 mmol/L (ref 20.0–28.0)
Calcium, Ion: 1.2 mmol/L (ref 1.15–1.40)
HCT: 37 % (ref 36.0–46.0)
Hemoglobin: 12.6 g/dL (ref 12.0–15.0)
O2 Saturation: 86 %
Potassium: 4.2 mmol/L (ref 3.5–5.1)
Sodium: 138 mmol/L (ref 135–145)
TCO2: 24 mmol/L (ref 22–32)
pCO2, Ven: 40.4 mmHg — ABNORMAL LOW (ref 44–60)
pH, Ven: 7.351 (ref 7.25–7.43)
pO2, Ven: 54 mmHg — ABNORMAL HIGH (ref 32–45)

## 2023-08-05 LAB — CBC WITH DIFFERENTIAL/PLATELET
Abs Immature Granulocytes: 0.05 10*3/uL (ref 0.00–0.07)
Basophils Absolute: 0 10*3/uL (ref 0.0–0.1)
Basophils Relative: 0 %
Eosinophils Absolute: 0.1 10*3/uL (ref 0.0–0.5)
Eosinophils Relative: 1 %
HCT: 37.2 % (ref 36.0–46.0)
Hemoglobin: 12.1 g/dL (ref 12.0–15.0)
Immature Granulocytes: 1 %
Lymphocytes Relative: 21 %
Lymphs Abs: 2 10*3/uL (ref 0.7–4.0)
MCH: 23.2 pg — ABNORMAL LOW (ref 26.0–34.0)
MCHC: 32.5 g/dL (ref 30.0–36.0)
MCV: 71.4 fL — ABNORMAL LOW (ref 80.0–100.0)
Monocytes Absolute: 1.1 10*3/uL — ABNORMAL HIGH (ref 0.1–1.0)
Monocytes Relative: 12 %
Neutro Abs: 6 10*3/uL (ref 1.7–7.7)
Neutrophils Relative %: 65 %
Platelets: 143 10*3/uL — ABNORMAL LOW (ref 150–400)
RBC: 5.21 MIL/uL — ABNORMAL HIGH (ref 3.87–5.11)
RDW: 18.9 % — ABNORMAL HIGH (ref 11.5–15.5)
WBC: 9.2 10*3/uL (ref 4.0–10.5)
nRBC: 0 % (ref 0.0–0.2)

## 2023-08-05 LAB — RESP PANEL BY RT-PCR (RSV, FLU A&B, COVID)  RVPGX2
Influenza A by PCR: NEGATIVE
Influenza B by PCR: NEGATIVE
Resp Syncytial Virus by PCR: NEGATIVE
SARS Coronavirus 2 by RT PCR: NEGATIVE

## 2023-08-05 LAB — COMPREHENSIVE METABOLIC PANEL
ALT: 40 U/L (ref 0–44)
AST: 30 U/L (ref 15–41)
Albumin: 3 g/dL — ABNORMAL LOW (ref 3.5–5.0)
Alkaline Phosphatase: 71 U/L (ref 38–126)
Anion gap: 6 (ref 5–15)
BUN: 23 mg/dL (ref 8–23)
CO2: 25 mmol/L (ref 22–32)
Calcium: 9 mg/dL (ref 8.9–10.3)
Chloride: 105 mmol/L (ref 98–111)
Creatinine, Ser: 1.68 mg/dL — ABNORMAL HIGH (ref 0.44–1.00)
GFR, Estimated: 31 mL/min — ABNORMAL LOW (ref >60)
Glucose, Bld: 133 mg/dL — ABNORMAL HIGH (ref 70–99)
Potassium: 4 mmol/L (ref 3.5–5.1)
Sodium: 136 mmol/L (ref 135–145)
Total Bilirubin: 0.8 mg/dL (ref 0.3–1.2)
Total Protein: 6 g/dL — ABNORMAL LOW (ref 6.5–8.1)

## 2023-08-05 LAB — TROPONIN I (HIGH SENSITIVITY)
Troponin I (High Sensitivity): 22 ng/L — ABNORMAL HIGH (ref <18)
Troponin I (High Sensitivity): 22 ng/L — ABNORMAL HIGH (ref <18)

## 2023-08-05 LAB — BRAIN NATRIURETIC PEPTIDE: B Natriuretic Peptide: 1251.6 pg/mL — ABNORMAL HIGH (ref 0.0–100.0)

## 2023-08-05 LAB — I-STAT CG4 LACTIC ACID, ED: Lactic Acid, Venous: 1.5 mmol/L (ref 0.5–1.9)

## 2023-08-05 MED ORDER — TACROLIMUS 1 MG PO CAPS: 5.0000 mg | ORAL_CAPSULE | Freq: Two times a day (BID) | ORAL | Status: DC

## 2023-08-05 MED ORDER — AZITHROMYCIN 250 MG PO TABS
500.0000 mg | ORAL_TABLET | Freq: Every day | ORAL | Status: AC
  Administered 2023-08-06 – 2023-08-08 (×3): 500 mg via ORAL
  Filled 2023-08-05 (×3): qty 2

## 2023-08-05 MED ORDER — CALCITRIOL 0.25 MCG PO CAPS
0.2500 ug | ORAL_CAPSULE | Freq: Every day | ORAL | Status: DC
  Administered 2023-08-06 – 2023-08-08 (×3): 0.25 ug via ORAL
  Filled 2023-08-05 (×3): qty 1

## 2023-08-05 MED ORDER — PREDNISONE 20 MG PO TABS
40.0000 mg | ORAL_TABLET | Freq: Every day | ORAL | Status: DC
  Administered 2023-08-05 – 2023-08-08 (×4): 40 mg via ORAL
  Filled 2023-08-05 (×4): qty 2

## 2023-08-05 MED ORDER — SENNOSIDES-DOCUSATE SODIUM 8.6-50 MG PO TABS: 1.0000 | ORAL_TABLET | Freq: Every day | ORAL | Status: DC | PRN

## 2023-08-05 MED ORDER — TACROLIMUS 1 MG PO CAPS
4.0000 mg | ORAL_CAPSULE | Freq: Two times a day (BID) | ORAL | Status: DC
  Administered 2023-08-05 – 2023-08-08 (×6): 4 mg via ORAL
  Filled 2023-08-05 (×7): qty 4

## 2023-08-05 MED ORDER — UMECLIDINIUM BROMIDE 62.5 MCG/ACT IN AEPB
1.0000 | INHALATION_SPRAY | Freq: Every day | RESPIRATORY_TRACT | Status: DC
  Administered 2023-08-06 – 2023-08-08 (×3): 1 via RESPIRATORY_TRACT
  Filled 2023-08-05: qty 7

## 2023-08-05 MED ORDER — RIVAROXABAN 10 MG PO TABS: 10.0000 mg | ORAL_TABLET | Freq: Every day | ORAL | Status: DC

## 2023-08-05 MED ORDER — AZELASTINE HCL 0.1 % NA SOLN: 1.0000 | Freq: Every day | NASAL | Status: DC | PRN

## 2023-08-05 MED ORDER — AZELASTINE HCL 0.15 % NA SOLN: 1.0000 | Freq: Every day | NASAL | Status: DC | PRN

## 2023-08-05 MED ORDER — POTASSIUM CHLORIDE CRYS ER 20 MEQ PO TBCR
20.0000 meq | EXTENDED_RELEASE_TABLET | Freq: Every day | ORAL | Status: DC
  Administered 2023-08-06: 20 meq via ORAL
  Filled 2023-08-05: qty 1

## 2023-08-05 MED ORDER — METOPROLOL TARTRATE 50 MG PO TABS
50.0000 mg | ORAL_TABLET | Freq: Two times a day (BID) | ORAL | Status: DC
  Administered 2023-08-06 – 2023-08-08 (×5): 50 mg via ORAL
  Filled 2023-08-05 (×5): qty 1

## 2023-08-05 MED ORDER — ISOSORBIDE MONONITRATE ER 30 MG PO TB24
30.0000 mg | ORAL_TABLET | Freq: Every day | ORAL | Status: DC
  Administered 2023-08-06 – 2023-08-08 (×3): 30 mg via ORAL
  Filled 2023-08-05 (×3): qty 1

## 2023-08-05 MED ORDER — EZETIMIBE 10 MG PO TABS
10.0000 mg | ORAL_TABLET | Freq: Every day | ORAL | Status: DC
  Administered 2023-08-06 – 2023-08-08 (×3): 10 mg via ORAL
  Filled 2023-08-05 (×3): qty 1

## 2023-08-05 MED ORDER — IPRATROPIUM-ALBUTEROL 0.5-2.5 (3) MG/3ML IN SOLN
3.0000 mL | RESPIRATORY_TRACT | Status: AC
  Administered 2023-08-05 (×2): 3 mL via RESPIRATORY_TRACT
  Filled 2023-08-05 (×2): qty 3

## 2023-08-05 MED ORDER — TACROLIMUS 1 MG PO CAPS
4.0000 mg | ORAL_CAPSULE | Freq: Two times a day (BID) | ORAL | Status: DC
  Filled 2023-08-05: qty 4

## 2023-08-05 MED ORDER — ASPIRIN 81 MG PO TBEC
81.0000 mg | DELAYED_RELEASE_TABLET | Freq: Every day | ORAL | Status: DC
  Administered 2023-08-06 – 2023-08-08 (×3): 81 mg via ORAL
  Filled 2023-08-05 (×3): qty 1

## 2023-08-05 MED ORDER — ENOXAPARIN SODIUM 30 MG/0.3ML IJ SOSY
30.0000 mg | PREFILLED_SYRINGE | INTRAMUSCULAR | Status: DC
  Administered 2023-08-05 – 2023-08-07 (×3): 30 mg via SUBCUTANEOUS
  Filled 2023-08-05 (×3): qty 0.3

## 2023-08-05 MED ORDER — GUAIFENESIN ER 600 MG PO TB12
600.0000 mg | ORAL_TABLET | Freq: Two times a day (BID) | ORAL | Status: DC
  Administered 2023-08-05 – 2023-08-06 (×3): 600 mg via ORAL
  Filled 2023-08-05 (×3): qty 1

## 2023-08-05 MED ORDER — IPRATROPIUM-ALBUTEROL 0.5-2.5 (3) MG/3ML IN SOLN: 3.0000 mL | RESPIRATORY_TRACT | Status: DC

## 2023-08-05 MED ORDER — FUROSEMIDE 10 MG/ML IJ SOLN
20.0000 mg | Freq: Once | INTRAMUSCULAR | Status: AC
  Administered 2023-08-05: 20 mg via INTRAVENOUS
  Filled 2023-08-05: qty 2

## 2023-08-05 MED ORDER — FLUTICASONE FUROATE-VILANTEROL 200-25 MCG/ACT IN AEPB
1.0000 | INHALATION_SPRAY | Freq: Every day | RESPIRATORY_TRACT | Status: DC
  Administered 2023-08-06 – 2023-08-08 (×3): 1 via RESPIRATORY_TRACT
  Filled 2023-08-05: qty 28

## 2023-08-05 MED ORDER — NITROGLYCERIN 0.4 MG SL SUBL: 0.4000 mg | SUBLINGUAL_TABLET | SUBLINGUAL | Status: DC | PRN

## 2023-08-05 MED ORDER — ACETAMINOPHEN 325 MG PO TABS: 650.0000 mg | ORAL_TABLET | Freq: Three times a day (TID) | ORAL | Status: DC | PRN

## 2023-08-05 MED ORDER — AMLODIPINE BESYLATE 10 MG PO TABS
10.0000 mg | ORAL_TABLET | Freq: Every day | ORAL | Status: DC
  Administered 2023-08-06 – 2023-08-08 (×3): 10 mg via ORAL
  Filled 2023-08-05 (×3): qty 1

## 2023-08-05 MED ORDER — FLUTICASONE PROPIONATE 50 MCG/ACT NA SUSP
1.0000 | Freq: Every day | NASAL | Status: DC
  Administered 2023-08-06 – 2023-08-08 (×3): 1 via NASAL
  Filled 2023-08-05: qty 16

## 2023-08-05 MED ORDER — PANTOPRAZOLE SODIUM 40 MG PO TBEC
40.0000 mg | DELAYED_RELEASE_TABLET | Freq: Every day | ORAL | Status: DC
  Administered 2023-08-06 – 2023-08-08 (×3): 40 mg via ORAL
  Filled 2023-08-05 (×3): qty 1

## 2023-08-05 NOTE — Plan of Care (Signed)

## 2023-08-05 NOTE — Hospital Course (Addendum)
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 Echo 05/10/2023 Normal LV systolic function.  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 worsened  since previous echo) No aortic stenosis Moderate pulmonic insufficiency   Woke up this morning  Started having mid sternal cp   Missed a lot  Fluctuating weight, creatinine too, then echo  Review renal failure    3 days of dyspnea and dry ocugh 3 episodes of loose stools yesterday  No chang in urine  2 liters usual, 3 with walking Sleep on 4 pillows chronic  Dyspnea on flat ground  Worsened over 3 months Usually gets copd exac at weather change  Heart/DM in family and HD   17-18 year since smoking, smoked about 35 years 1/2ppd Occasional beer or 2 beers a day  Lives with family (grandsons?_ Worked at dialysis center

## 2023-08-05 NOTE — ED Notes (Signed)
ED TO INPATIENT HANDOFF REPORT  ED Nurse Name and Phone #:  Florentina Addison [phone]  S Name/Age/Gender Timoteo Ace 82 y.o. female Room/Bed: 017C/017C  Code Status   Code Status: Limited: Do not attempt resuscitation (DNR) -DNR-LIMITED -Do Not Intubate/DNI   Home/SNF/Other Home Patient oriented to: self, place, time, and situation Is this baseline? Yes   Triage Complete: Triage complete  Chief Complaint Acute on chronic hypoxic respiratory failure (HCC) [J96.21]  Triage Note Pt arrives via EMS from home. Pt reports SOB and cough over the past 3 days. HX of COPD. Pt was given 3 duonebs and 125mg  of solumedrol by ems. Pt has trouble speaking in short sentences and wob is labored. Pt is AxOx4. Pt also reports she has had a cramping pain in her chest 3 times today.    Allergies Allergies  Allergen Reactions   Sodium Hypochlorite Shortness Of Breath   Lactose Nausea And Vomiting   Asa Arthritis Strength-Antacid [Aspirin Buffered] Nausea And Vomiting and Other (See Comments)    STOMACH BURNS, also   Aspirin Nausea And Vomiting    Per pt. "can tolerate the enteric coated tablets".    Atorvastatin Other (See Comments)    Patient's skin was skin was sensitive   Banana Nausea And Vomiting   Lactose Intolerance (Gi) Nausea And Vomiting   Lisinopril Cough    Level of Care/Admitting Diagnosis ED Disposition     ED Disposition  Admit   Condition  --   Comment  Hospital [facility] [[id number]]  Level of Care: Telemetry Medical [104]  May place patient in observation at [facility] or [facility] if equivalent level of care is available:: Yes  Covid Evaluation: Confirmed COVID Negative  Diagnosis: Acute on chronic hypoxic respiratory failure [facility]) [[id number]]  Admitting Physician: Mercie Eon [[id number]]  Attending Physician: Mercie Eon [[id number]]          B Medical/Surgery History Past Medical History:  Diagnosis Date   Anemia    NOS / GI bleed Jan  2012, transfused, 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, hold ASA 2-3 weeks - consider plavix instead of ASA   Asthma    Cellulitis 12/19/2019   right upper arm   Coronary artery disease    cath 11/2007, grafts patent /  Nuclear, June, 2011, prior inferior MI with mild peri-infarct ischemia, anterior breast attenuation, EF 67%, done for renal transplant assessment / Low level exercise/Lexiscan Myoview (11/2013): EF 67%, no ischemi; normal study   Diabetes mellitus    type 2   ESRD on dialysis [facility])    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, 2012   Family history of breast cancer    Family history of prostate cancer    GERD (gastroesophageal reflux disease)    GI bleed 11/26/2010   January, 2012 , AVM   Gout    History of methicillin resistant staphylococcus aureus (MRSA)    Hyperlipidemia    Hyperparathyroidism    Hypertension    Myocardial infarction [facility])    Osteoporosis    Pneumonia 08/2010    Hospitalization, October, 2011   Primary osteoarthritis, 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   S/P kidney transplant    September, 2012, [facility]   Subacromial impingement of 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   Past Surgical History:  Procedure Laterality Date   ABDOMINAL HYSTERECTOMY  1980'S   ARTERIOVENOUS GRAFT PLACEMENT Left 03/04/2007   forearm   BIOPSY  09/30/2019   Procedure: BIOPSY;  Surgeon: Charlott Rakes, MD;  Location: [facility];  Service: Endoscopy;;   BIOPSY  12/27/2021   Procedure: BIOPSY;  Surgeon: Charlott Rakes, MD;  Location: [facility];  Service: Endoscopy;;   BREAST EXCISIONAL BIOPSY Right    benign more than 10 yr ago   BREAST SURGERY  YRS AGO   RT BREAST CYST REMOVED    CARDIAC CATHETERIZATION  06/03/2002; 12/04/2007   COLONOSCOPY WITH PROPOFOL N/A 07/14/2014   Procedure: COLONOSCOPY WITH PROPOFOL;  Surgeon: Shirley Friar, MD;  Location: [facility];  Service: Endoscopy;  Laterality: N/A;   COLONOSCOPY WITH PROPOFOL N/A 09/30/2019   Procedure: COLONOSCOPY WITH PROPOFOL;  Surgeon: Charlott Rakes, MD;   Location: [facility];  Service: Endoscopy;  Laterality: N/A;   COLONOSCOPY WITH PROPOFOL N/A 12/27/2021   Procedure: COLONOSCOPY WITH PROPOFOL;  Surgeon: Charlott Rakes, MD;  Location: [facility];  Service: Endoscopy;  Laterality: N/A;   CORONARY ARTERY BYPASS GRAFT  06/06/2002   x 4   ESOPHAGOGASTRODUODENOSCOPY N/A 12/27/2021   Procedure: ESOPHAGOGASTRODUODENOSCOPY (EGD);  Surgeon: Charlott Rakes, MD;  Location: [facility];  Service: Endoscopy;  Laterality: N/A;   HEMOSTASIS CLIP PLACEMENT  12/27/2021   Procedure: HEMOSTASIS CLIP PLACEMENT;  Surgeon: Charlott Rakes, MD;  Location: [facility];  Service: Endoscopy;;   HOT HEMOSTASIS N/A 07/14/2014   Procedure: HOT HEMOSTASIS (ARGON PLASMA COAGULATION/BICAP);  Surgeon: Shirley Friar, MD;  Location: [facility];  Service: Endoscopy;  Laterality: N/A;   IR FLUORO GUIDE CV LINE RIGHT  12/22/2019   IR US GUIDE VASC ACCESS RIGHT  12/22/2019   KIDNEY TRANSPLANT Right 08/04/2011   ORIF PATELLA Left 04/06/2016   Procedure: OPEN REDUCTION INTERNAL (ORIF) LEFT  PATELLA;  Surgeon: Loreta Ave, MD;  Location: [facility];  Service: Orthopedics;  Laterality: Left;   POLYPECTOMY  09/30/2019   Procedure: POLYPECTOMY;  Surgeon: Charlott Rakes, MD;  Location: [facility];  Service: Endoscopy;;   POLYPECTOMY  12/27/2021   Procedure: POLYPECTOMY;  Surgeon: Charlott Rakes, MD;  Location: [facility];  Service: Endoscopy;;   SHOULDER ARTHROSCOPY WITH ROTATOR CUFF REPAIR AND SUBACROMIAL DECOMPRESSION Left 08/03/2016   Procedure: LEFT SHOULDER ARTHROSCOPY WITH ROTATOR CUFF REPAIR AND SUBACROMIAL DECOMPRESSION with distal claviculectomy and extentsive debridement;  Surgeon: Loreta Ave, MD;  Location: [facility];  Service: Orthopedics;  Laterality: Left;  Block   THROMBECTOMY AND REVISION OF ARTERIOVENTOUS (AV) GORETEX  GRAFT Left 05/08/2007   forearm   TOTAL HIP ARTHROPLASTY  12/18/2012   Procedure: TOTAL HIP  ARTHROPLASTY;  Surgeon: Loreta Ave, MD;  [facility];  Service: Orthopedics;  Laterality: Left;   VESICOVAGINAL FISTULA CLOSURE W/ TAH  1984     A IV Location/Drains/Wounds Patient Lines/Drains/Airways Status     Active Line/Drains/Airways     Name Placement date Placement time Site Days   Peripheral IV 08/05/23 20 G Anterior;Right Forearm 08/05/23  1145  Forearm  less than 1   Fistula / Graft Left Forearm Arteriovenous vein graft --  --  Forearm  --            Intake/Output Last 24 hours No intake or output data in the 24 hours ending 08/05/23 1516  Labs/Imaging Results for orders placed or performed during the hospital encounter of 08/05/23 (from the past 48 hour(s))  Resp panel by RT-PCR (RSV, Flu A&B, Covid) Anterior Nasal Swab     Status: None   Collection Time: 08/05/23 11:34 AM   Specimen: Anterior Nasal Swab  Result Value Ref Range   SARS Coronavirus 2 by RT PCR NEGATIVE NEGATIVE   Influenza A by PCR  NEGATIVE NEGATIVE   Influenza B by PCR NEGATIVE NEGATIVE    Comment: (NOTE) The Xpert Xpress SARS-CoV-2/FLU/RSV plus assay is intended as an aid in the diagnosis of influenza from Nasopharyngeal swab specimens and should not be used as a sole basis for treatment. Nasal washings and aspirates are unacceptable for Xpert Xpress SARS-CoV-2/FLU/RSV testing.  Fact Sheet for Patients: BloggerCourse.com  Fact Sheet for Healthcare Providers: SeriousBroker.it  This test is not yet approved or cleared by the [place] FDA and has been authorized for detection and/or diagnosis of SARS-CoV-2 by FDA under an Emergency Use Authorization (EUA). This EUA will remain in effect (meaning this test can be used) for the duration of the COVID-19 declaration under Section 564(b)(1) of the Act, 21 U.S.C. section 360bbb-3(b)(1), unless the authorization is terminated or revoked.     Resp Syncytial Virus by PCR NEGATIVE  NEGATIVE    Comment: (NOTE) Fact Sheet for Patients: BloggerCourse.com  Fact Sheet for Healthcare Providers: SeriousBroker.it  This test is not yet approved or cleared by the [place] FDA and has been authorized for detection and/or diagnosis of SARS-CoV-2 by FDA under an Emergency Use Authorization (EUA). This EUA will remain in effect (meaning this test can be used) for the duration of the COVID-19 declaration under Section 564(b)(1) of the Act, 21 U.S.C. section 360bbb-3(b)(1), unless the authorization is terminated or revoked.  Performed at [facility], [address], [place], [place] [address]   CBC with Differential     Status: Abnormal   Collection Time: 08/05/23 11:41 AM  Result Value Ref Range   WBC 9.2 4.0 - 10.5 K/uL   RBC 5.21 (H) 3.87 - 5.11 MIL/uL   Hemoglobin 12.1 12.0 - 15.0 g/dL   HCT 56.4 33.2 - 95.1 %   MCV 71.4 (L) 80.0 - 100.0 fL   MCH 23.2 (L) 26.0 - 34.0 pg   MCHC 32.5 30.0 - 36.0 g/dL   RDW 88.4 (H) 16.6 - 06.3 %   Platelets 143 (L) 150 - 400 K/uL    Comment: REPEATED TO VERIFY   nRBC 0.0 0.0 - 0.2 %   Neutrophils Relative % 65 %   Neutro Abs 6.0 1.7 - 7.7 K/uL   Lymphocytes Relative 21 %   Lymphs Abs 2.0 0.7 - 4.0 K/uL   Monocytes Relative 12 %   Monocytes Absolute 1.1 (H) 0.1 - 1.0 K/uL   Eosinophils Relative 1 %   Eosinophils Absolute 0.1 0.0 - 0.5 K/uL   Basophils Relative 0 %   Basophils Absolute 0.0 0.0 - 0.1 K/uL   Immature Granulocytes 1 %   Abs Immature Granulocytes 0.05 0.00 - 0.07 K/uL    Comment: Performed at [facility], [address], [place], [place] [address]  Troponin I (High Sensitivity)     Status: Abnormal   Collection Time: 08/05/23 11:41 AM  Result Value Ref Range   Troponin I (High Sensitivity) 22 (H) <18 ng/L    Comment: (NOTE) Elevated high sensitivity troponin I (hsTnI) values and significant  changes across serial measurements may suggest ACS but  many other  chronic and acute conditions are known to elevate hsTnI results.  Refer to the "Links" section for chest pain algorithms and additional  guidance. Performed at [facility], [address], [place], [place] [address]   Comprehensive metabolic panel     Status: Abnormal   Collection Time: 08/05/23 11:41 AM  Result Value Ref Range   Sodium 136 135 -  145 mmol/L   Potassium 4.0 3.5 - 5.1 mmol/L   Chloride 105 98 - 111 mmol/L   CO2 25 22 - 32 mmol/L   Glucose, Bld 133 (H) 70 - 99 mg/dL    Comment: Glucose reference range applies only to samples taken after fasting for at least 8 hours.   BUN 23 8 - 23 mg/dL   Creatinine, Ser 4.13 (H) 0.44 - 1.00 mg/dL   Calcium 9.0 8.9 - 24.4 mg/dL   Total Protein 6.0 (L) 6.5 - 8.1 g/dL   Albumin 3.0 (L) 3.5 - 5.0 g/dL   AST 30 15 - 41 U/L   ALT 40 0 - 44 U/L   Alkaline Phosphatase 71 38 - 126 U/L   Total Bilirubin 0.8 0.3 - 1.2 mg/dL   GFR, Estimated 31 (L) >60 mL/min    Comment: (NOTE) Calculated using the CKD-EPI Creatinine Equation (2021)    Anion gap 6 5 - 15    Comment: Performed at [facility], [address] [place], [place] [address]  Brain natriuretic peptide     Status: Abnormal   Collection Time: 08/05/23 11:41 AM  Result Value Ref Range   B Natriuretic Peptide 1,251.6 (H) 0.0 - 100.0 pg/mL    Comment: Performed at [facility], [address], [place], [place] [address]  I-Stat venous blood gas, ED     Status: Abnormal   Collection Time: 08/05/23 11:47 AM  Result Value Ref Range   pH, Ven 7.351 7.25 - 7.43   pCO2, Ven 40.4 (L) 44 - 60 mmHg   pO2, Ven 54 (H) 32 - 45 mmHg   Bicarbonate 22.3 20.0 - 28.0 mmol/L   TCO2 24 22 - 32 mmol/L   O2 Saturation 86 %   Acid-base deficit 3.0 (H) 0.0 - 2.0 mmol/L   Sodium 138 135 - 145 mmol/L   Potassium 4.2 3.5 - 5.1 mmol/L   Calcium, Ion 1.20 1.15 - 1.40 mmol/L   HCT 37.0 36.0 - 46.0 %   Hemoglobin 12.6 12.0 - 15.0 g/dL   Sample type VENOUS   I-Stat Lactic  Acid     Status: None   Collection Time: 08/05/23 11:57 AM  Result Value Ref Range   Lactic Acid, Venous 1.5 0.5 - 1.9 mmol/L   DG Chest Port 1 View  Result Date: 08/05/2023 CLINICAL DATA:  Shortness of breath with cough over the past 3 days. EXAM: PORTABLE CHEST 1 VIEW COMPARISON:  12/18/2019 FINDINGS: Sternotomy wires unchanged. Lungs are adequately inflated with mild hazy prominence of the central pulmonary vessels suggesting mild vascular congestion. No focal lobar consolidation or effusion. Borderline cardiomegaly. Remainder of the exam is unchanged. IMPRESSION: Borderline cardiomegaly with findings suggesting mild vascular congestion. Electronically Signed   By: Elberta Fortis M.D.   On: 08/05/2023 11:58    Pending Labs Unresulted Labs (From admission, onward)    None       Vitals/Pain Today's Vitals   08/05/23 1145 08/05/23 1155 08/05/23 1200 08/05/23 1230  BP:   (!) 145/69 126/60  Pulse: 84 87 87 84  Resp: (!) 25 20 17 17   Temp:      TempSrc:      SpO2: 94% 90% (!) 89% 91%  Weight:      Height:      PainSc:        Isolation Precautions No active isolations  Medications Medications  ipratropium-albuterol (DUONEB) 0.5-2.5 (3) MG/3ML nebulizer solution 3 mL (3 mLs Nebulization Given 08/05/23  1444)  enoxaparin (LOVENOX) injection 30 mg (has no administration in time range)    Mobility walks with device     Focused Assessments Pulmonary Assessment Handoff:  Lung sounds: Bilateral Breath Sounds: Diminished, Expiratory wheezes O2 Device: Nasal Cannula O2 Flow Rate (L/min): 4 L/min    R Recommendations: See Admitting Provider Note  Report given to:   Additional Notes:

## 2023-08-05 NOTE — H&P (Cosign Needed Addendum)
Date: 08/05/2023               Patient Name:  [name] MRN: [id number]  DOB: 07/11/1941 Age / Sex: 82 y.o., female   PCP: [name], MD         Medical Service: Internal Medicine Teaching Service         Attending Physician: Dr. Mercie Eon, MD      First Contact: Dr. Katheran James, DO Pager [phone]    Second Contact: Dr. Rocky Morel, DO Pager [phone]         After Hours (After 5p/  First Contact Pager: [phone]  weekends / holidays): Second Contact Pager: [phone]   SUBJECTIVE   Chief Complaint: Dyspnea, Chest pain  History of Present Illness:  [name] is an 82 year old female with past medical history of COPD on 2 to 3 L oxygen at baseline, CAD with previous MI s/p CABG in 2001, kidney transplant on immunosuppression, hypertension.  She reported to the [facility] emergency department via EMS on the morning of 9/15 complaining of dyspnea and chest pain.  She states she was in her normal state of health yesterday but awoke today with new dyspnea beyond her baseline and mid sternal chest pain that she describes as "someone digging into her chest."  When she tried to leave her bed she became dizzy and her chest pain worsened which point she called 911.  She received 4 DuoNeb treatments and solumedrol from EMS and ED personnel and did experience some improvement of her dyspnea and resolution of her chest pain.  She denies recent sick contacts or symptoms of illness including fever, chills, or changes in her cough.  Her chronic cough is dry but she notes the feeling of phlegm that she cannot remove.  When she does have phlegm it is white.  She does believe that her dyspnea has worsened over the last 3 months including increased trouble walking to the mailbox.  She notes that her COPD can become worse when the weather changes.  She sees a cardiologist secondary to her history of heart attack approximately in the mid 2000's and has never been told that she has  heart failure.  She sees nephrology secondary to her kidney transplant and remains on immunosuppression.  She sees a pulmonologist for her COPD and reports adherence to her triple therapy inhalers tiotropium and Breo Ellipta.  ED Course: Arrives to the emergency department via EMS and was found to have labored breathing and speech was limited by dyspnea.  At time of arrival she had received 3 DuoNebs treatments as well as Solu-Medrol.  Vitals revealed blood pressure 158/68, respiratory rate as high as 31, oxygen saturations around 90 on 4 L nasal cannula, and no tachycardia or fever.  Respiratory panel was negative for coronavirus, flu, or RSV.  White blood cells were 9.2.  Chemistry is without electrolyte abnormalities and creatinine is 1.68 with most recent value from 1 year ago being 1.61.  BNP is elevated to 1251.  VBG does not reveal acidosis or alkalosis.  Lactic acid is 1.5.  Initial troponin is 22 and the second is pending.  Chest x-ray reveals borderline cardiomegaly and mild vascular congestion, there are no focal consolidations.  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, hold ASA 2-3 weeks - consider plavix instead of ASA   Asthma    Cellulitis 12/19/2019  right upper arm   Coronary artery disease    cath 11/2007, grafts patent /  Nuclear, June, 2011, prior inferior MI with mild peri-infarct ischemia, anterior breast attenuation, EF 67%, done for renal transplant assessment / Low level exercise/Lexiscan Myoview (11/2013): EF 67%, no ischemi; normal study   Diabetes mellitus    type 2   ESRD on dialysis [facility])    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, 2012   Family history of breast cancer    Family history of prostate cancer    GERD (gastroesophageal reflux disease)    GI bleed 11/26/2010   January, 2012 , AVM   Gout    History of methicillin resistant staphylococcus aureus (MRSA)    Hyperlipidemia     Hyperparathyroidism    Hypertension    Myocardial infarction [facility])    Osteoporosis    Pneumonia 08/2010    Hospitalization, October, 2011   Primary osteoarthritis, 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   S/P kidney transplant    September, 2012, [facility]   Subacromial impingement of 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     Meds:  Tylenol 650 mg every morning Albuterol nebulizer every 6 hours as needed Albuterol inhaler every 6 hours as needed Amlodipine 10 mg daily Aspirin 81 mg daily Azelastine 0.15% solution as needed Budesonide 0.25mg  nebulizer as needed Calcitriol 0.25 mcg daily Ezetimibe 10 mg daily Flonase 50 mcg daily Breo Ellipta 200-25 daily Imdur 30 mg daily Lopressor 50 mg twice a day Nitroglycerin 0.4 mg as needed Omeprazole 20 mg daily Potassium chloride 20 mEq daily Prednisone 5 mg twice daily Rosuvastatin 40 mg daily Senokot 8.6-50 mg as needed  Tacrolimus 5 mg twice daily Tiotropium bromide 1.25 mcg daily  Past Surgical History Past Surgical History:  Procedure Laterality Date   ABDOMINAL HYSTERECTOMY  1980'S   ARTERIOVENOUS GRAFT PLACEMENT Left 03/04/2007   forearm   BIOPSY  09/30/2019   Procedure: BIOPSY;  Surgeon: Charlott Rakes, MD;  Location: [facility];  Service: Endoscopy;;   BIOPSY  12/27/2021   Procedure: BIOPSY;  Surgeon: Charlott Rakes, MD;  Location: [facility];  Service: Endoscopy;;   BREAST EXCISIONAL BIOPSY Right    benign more than 10 yr ago   BREAST SURGERY  YRS AGO   RT BREAST CYST REMOVED    CARDIAC CATHETERIZATION  06/03/2002; 12/04/2007   COLONOSCOPY WITH PROPOFOL N/A 07/14/2014   Procedure: COLONOSCOPY WITH PROPOFOL;  Surgeon: Shirley Friar, MD;  Location: [facility];  Service: Endoscopy;  Laterality: N/A;   COLONOSCOPY WITH PROPOFOL N/A 09/30/2019   Procedure: COLONOSCOPY WITH PROPOFOL;  Surgeon: Charlott Rakes, MD;  Location: [facility];  Service: Endoscopy;  Laterality: N/A;   COLONOSCOPY WITH PROPOFOL N/A 12/27/2021   Procedure:  COLONOSCOPY WITH PROPOFOL;  Surgeon: Charlott Rakes, MD;  Location: [facility];  Service: Endoscopy;  Laterality: N/A;   CORONARY ARTERY BYPASS GRAFT  06/06/2002   x 4   ESOPHAGOGASTRODUODENOSCOPY N/A 12/27/2021   Procedure: ESOPHAGOGASTRODUODENOSCOPY (EGD);  Surgeon: Charlott Rakes, MD;  Location: [facility];  Service: Endoscopy;  Laterality: N/A;   HEMOSTASIS CLIP PLACEMENT  12/27/2021   Procedure: HEMOSTASIS CLIP PLACEMENT;  Surgeon: Charlott Rakes, MD;  Location: [facility];  Service: Endoscopy;;   HOT HEMOSTASIS N/A 07/14/2014   Procedure: HOT HEMOSTASIS (ARGON PLASMA COAGULATION/BICAP);  Surgeon: Shirley Friar, MD;  Location: [facility];  Service: Endoscopy;  Laterality: N/A;   IR FLUORO GUIDE CV LINE RIGHT  12/22/2019   IR US GUIDE VASC ACCESS RIGHT  12/22/2019   KIDNEY TRANSPLANT Right 08/04/2011   ORIF PATELLA Left  04/06/2016   Procedure: OPEN REDUCTION INTERNAL (ORIF) LEFT  PATELLA;  Surgeon: Loreta Ave, MD;  Location: [facility];  Service: Orthopedics;  Laterality: Left;   POLYPECTOMY  09/30/2019   Procedure: POLYPECTOMY;  Surgeon: Charlott Rakes, MD;  Location: [facility];  Service: Endoscopy;;   POLYPECTOMY  12/27/2021   Procedure: POLYPECTOMY;  Surgeon: Charlott Rakes, MD;  Location: [facility];  Service: Endoscopy;;   SHOULDER ARTHROSCOPY WITH ROTATOR CUFF REPAIR AND SUBACROMIAL DECOMPRESSION Left 08/03/2016   Procedure: LEFT SHOULDER ARTHROSCOPY WITH ROTATOR CUFF REPAIR AND SUBACROMIAL DECOMPRESSION with distal claviculectomy and extentsive debridement;  Surgeon: Loreta Ave, MD;  Location: [facility];  Service: Orthopedics;  Laterality: Left;  Block   THROMBECTOMY AND REVISION OF ARTERIOVENTOUS (AV) GORETEX  GRAFT Left 05/08/2007   forearm   TOTAL HIP ARTHROPLASTY  12/18/2012   Procedure: TOTAL HIP ARTHROPLASTY;  Surgeon: Loreta Ave, MD;  Location: [facility] OR;  Service: Orthopedics;  Laterality: Left;   VESICOVAGINAL  FISTULA CLOSURE W/ TAH  1984    Social:  Lives at home with her grandson Occupation: retired [employer] operational officer Support: from family, daughter and sister are present in ED Level of Function: independent in ADLs/IADLs PCP: [name], MD Substances: Former smoker who quit 20 years ago and has a 15-pack-year history.  Occasional beer.  No other substances.  Family History:  Family history of heart disease, kidney disease, and diabetes  Allergies: Allergies as of 08/05/2023 - Review Complete 08/05/2023  Allergen Reaction Noted   Sodium hypochlorite Shortness Of Breath 12/10/2019   Lactose Nausea And Vomiting 03/07/2016   Asa arthritis strength-antacid [aspirin buffered] Nausea And Vomiting and Other (See Comments) 07/04/2011   Aspirin Nausea And Vomiting 03/07/2016   Atorvastatin Other (See Comments)    Banana Nausea And Vomiting 10/16/2011   Lactose intolerance (gi) Nausea And Vomiting 12/05/2012   Lisinopril Cough 06/20/2012    Review of Systems: A complete ROS was negative except as per HPI.   OBJECTIVE:   Physical Exam: Blood pressure 126/60, pulse 84, temperature 97.9 F (36.6 C), temperature source Axillary, resp. rate 17, height 5\' 2"  (1.575 m), weight 63 kg, last menstrual period 03/04/2022, SpO2 91%.  Constitutional:Well appearing female. In no acute distress. HENT: Normocephalic, atraumatic,  Eyes: Sclera non-icteric, PERRL, EOM intact Neck:normal atraumatic, no neck masses, normal thyroid, no jvd Cardio:Regular rate and rhythm. No murmurs, rubs, or gallops. 2+ bilateral radial and dorsalis pedis  pulses. No JVD. Pulm: Diffuse wheezing bilaterally. Significant congestion. Normal work of breathing on room air but aggravated by coughing spell mid exam that required a few minutes to recover from. Abdomen: Soft, non-tender, non-distended, positive bowel sounds. ZOX:[id number] for extremity edema. Skin:Warm and dry. Neuro:Alert and oriented x3. No focal deficit  noted. Psych:Pleasant mood and affect.  Labs: CBC    Component Value Date/Time   WBC 9.2 08/05/2023 1141   RBC 5.21 (H) 08/05/2023 1141   HGB 12.6 08/05/2023 1147   HGB 12.8 02/28/2021 1014   HGB 9.8 (L) 10/27/2010 0946   HCT 37.0 08/05/2023 1147   HCT 39.0 02/28/2021 1014   HCT 30.1 (L) 10/27/2010 0946   PLT 143 (L) 08/05/2023 1141   PLT 148 (L) 02/28/2021 1014   MCV 71.4 (L) 08/05/2023 1141   MCV 77 (L) 02/28/2021 1014   MCV 85.8 10/27/2010 0946   MCH 23.2 (L) 08/05/2023 1141   MCHC 32.5 08/05/2023 1141   RDW 18.9 (H) 08/05/2023 1141   RDW 16.6 (H) 02/28/2021 1014  RDW 19.1 (H) 10/27/2010 0946   LYMPHSABS 2.0 08/05/2023 1141   LYMPHSABS 1.4 10/27/2010 0946   MONOABS 1.1 (H) 08/05/2023 1141   MONOABS 0.7 10/27/2010 0946   EOSABS 0.1 08/05/2023 1141   EOSABS 0.0 10/27/2010 0946   BASOSABS 0.0 08/05/2023 1141   BASOSABS 0.0 10/27/2010 0946     CMP     Component Value Date/Time   NA 138 08/05/2023 1147   NA 143 03/09/2021 0818   K 4.2 08/05/2023 1147   CL 105 08/05/2023 1141   CO2 25 08/05/2023 1141   GLUCOSE 133 (H) 08/05/2023 1141   BUN 23 08/05/2023 1141   BUN 26 03/09/2021 0818   CREATININE 1.68 (H) 08/05/2023 1141   CREATININE 1.10 (H) 06/13/2016 0825   CALCIUM 9.0 08/05/2023 1141   CALCIUM 9.8 02/26/2012 1715   PROT 6.0 (L) 08/05/2023 1141   PROT 6.4 09/02/2021 0800   ALBUMIN 3.0 (L) 08/05/2023 1141   ALBUMIN 4.3 09/02/2021 0800   AST 30 08/05/2023 1141   ALT 40 08/05/2023 1141   ALKPHOS 71 08/05/2023 1141   BILITOT 0.8 08/05/2023 1141   BILITOT 0.4 09/02/2021 0800   GFRNONAA 31 (L) 08/05/2023 1141   GFRAA 56 (L) 12/23/2019 0455    Imaging: DG Chest Port 1 View  Result Date: 08/05/2023 CLINICAL DATA:  Shortness of breath with cough over the past 3 days. EXAM: PORTABLE CHEST 1 VIEW COMPARISON:  12/18/2019 FINDINGS: Sternotomy wires unchanged. Lungs are adequately inflated with mild hazy prominence of the central pulmonary vessels suggesting mild  vascular congestion. No focal lobar consolidation or effusion. Borderline cardiomegaly. Remainder of the exam is unchanged. IMPRESSION: Borderline cardiomegaly with findings suggesting mild vascular congestion. Electronically Signed   By: Elberta Fortis M.D.   On: 08/05/2023 11:58     EKG: personally reviewed my interpretation is normal sinus rhythm. Consistent with prior EKG.  ASSESSMENT & PLAN:   Assessment & Plan by Problem: Principal Problem:   Acute on chronic hypoxic respiratory failure (HCC)   [name] is a 82 y.o. female with pertinent PMH of COPD on 2 to 3 L oxygen at baseline, CAD with previous MI requiring open heart surgery, kidney transplant on immunosuppression who presented with chest pain and dyspnea and is admitted for acute on chronic hypoxic respiratory failure.  Acute on Chronic Hypoxic Respiratory Failure COPD Exacerbation Possible acute congestive heart failure  This patient is presenting with new onset dyspnea requiring supplemental oxygen above her normal baseline of 2 L at rest.  She has a history of COPD and her respiratory status improved with initial nebulizer and steroid treatments.  Does not currently have signs of an acute infection she does not have a white count, is afebrile, chest x-ray does not suggest pneumonia, and initial viral panel is negative for COVID flu and RSV.  She does however have a significant cardiac history with a previous MI, chest x-ray suggests vascular congestion, and her BNP is elevated 1251.  On exam she is euvolemic.  Lungs are diffusely wheezy with congestion, rhonchi is not appreciated.  Regarding her heart history, recent Echo in 04/2023 showed EF 55-60% and mild concentric LV hypertrophy and indeterminate diastolic parameters, mild-moderate RA/LA dilation.  In this setting, it is possible that her respiratory failure is multifactorial.  Will begin initial treatment targeting a COPD exacerbation as she is exhibiting wheezing and  responded well to previous therapy.  She is on tacrolimus for kidney transplant and that may also be contributing to dyspnea as  it can cause pulmonary edema.  Currently no indication for antibiotics. - supplemental oxygen with goal 88-92%, she does not need to be in upper 90s given her history of COPD - DuoNebs every 4 hours - Will consider diuresis after reassessment - Mucinex 600mg  BID - Diuresis 20mg  lasix - CCM x24 hours - strict Is/Os and daily weights - Trelegy Ellipta daily, she is on triple therapy at home  CAD/previous MI with CABG in 2001 Complained of chest pain at admission. Importantly, troponins of 22 and 22 at admission. Pain dose not appear cardiac - "digging" pain associated with breathing and relieved by duonebs. Most recent ECHO from three months ago appears stable, per above. - continue Aspirin 81 mg daily - continue Ezetimibe 10 mg daily - continue Rosuvastatin 40 mg daily - continue Nitroglycerin 0.4 mg as needed - continue Imdur 30 mg daily  ESRD s/p [facility] Transplant in Sep 2012 Creatinine today is 1.61.  It appears that this is her baseline per care everywhere with creatinines at about 1.8 in May and January of this year.  She is not complaining of urinary symptoms, fevers, or pain to arouse concern of acute issues.  Will continue to monitor renal status in this patient with a transplanted kidney. - on Prednisone 40 mg x 5 days for COPD exacerbation, will need to return to 5mg  twice daily after - continue Tacrolimus 5 mg twice daily - continue Calcitriol 0.25 mcg daily  GERD - continue Omeprazole 20 mg daily  Constipation - continue Senokot 8.6-50 mg as needed   HTN - continue Amlodipine 10 mg daily - continue Lopressor 50 mg twice a day - continue Potassium chloride 20 mEq daily  Diet: Carb-Modified VTE: Enoxaparin IVF: None, Code: DNR/DNI  Dispo: Admit patient to Observation with expected length of stay less than 2 midnights.  Signed: Katheran James, DO Internal Medicine Resident PGY-1  08/05/2023, 2:59 PM   Dr. Katheran James, DO Pager [phone]

## 2023-08-05 NOTE — ED Triage Notes (Signed)
Pt arrives via EMS from home. Pt reports SOB and cough over the past 3 days. HX of COPD. Pt was given 3 duonebs and 125mg  of solumedrol by ems. Pt has trouble speaking in short sentences and wob is labored. Pt is AxOx4. Pt also reports she has had a cramping pain in her chest 3 times today.

## 2023-08-05 NOTE — ED Provider Notes (Signed)
[facility] Provider Note   CSN: [id number] Arrival date & time: 08/05/23  1127     History  Chief Complaint  Patient presents with   Shortness of Breath    [name] is a 82 y.o. female.  82 year old female with prior medical history as detailed below presents for evaluation.  Patient with known history of asthma, COPD.  Patient with reported increased shortness of breath and cough times approximately 3 days.  Patient reports use of home O2 and breathing treatments at home have not improved her symptoms.  Patient reports some vague chest discomfort this morning as well.  She then called EMS given her worsening respiratory symptoms and chest discomfort.  She denies chest pain on arrival to the ED.  EMS gave patient 3 DuoNeb treatments 125 mg Solu-Medrol.  On arrival to the ED the patient feels improved from how she was feeling earlier this morning.  The history is provided by the patient and medical records.       Home Medications Prior to Admission medications   Medication Sig Start Date End Date Taking? Authorizing Provider  acetaminophen (TYLENOL) 650 MG CR tablet Take 650 mg by mouth every morning.    [provider]  albuterol (PROVENTIL) (2.5 MG/3ML) 0.083% nebulizer solution Take 2.5 mg by nebulization every 6 (six) hours as needed for wheezing or shortness of breath.    [provider]  albuterol (VENTOLIN HFA) 108 (90 Base) MCG/ACT inhaler Inhale 1-2 puffs into the lungs every 6 (six) hours as needed for wheezing or shortness of breath. Patient not taking: Reported on 04/09/2023    [provider]  amLODipine (NORVASC) 10 MG tablet Take 10 mg by mouth daily.     [provider]  aspirin EC 81 MG tablet Take 81 mg by mouth daily.    [provider]  Azelastine HCl 0.15 % SOLN Place 1 spray into both nostrils daily as needed for allergies.    [provider]   budesonide (PULMICORT) 0.25 MG/2ML nebulizer solution Inhale 0.25 mg into the lungs as needed. Patient not taking: Reported on 04/09/2023 02/11/20   [provider]  calcitRIOL (ROCALTROL) 0.25 MCG capsule Take 0.25 mcg by mouth daily.     [provider]  ezetimibe (ZETIA) 10 MG tablet TAKE 1 TABLET BY MOUTH EVERY DAY 04/02/23   Nahser, Deloris Ping, MD  fluticasone (FLONASE) 50 MCG/ACT nasal spray Place into both nostrils. 05/30/21   [provider]  fluticasone furoate-vilanterol (BREO ELLIPTA) 200-25 MCG/INH AEPB Inhale 1 puff into the lungs daily as needed (respiratory issues.).  10/23/17   [provider]  isosorbide mononitrate (IMDUR) 30 MG 24 hr tablet Take 1 tablet (30 mg total) by mouth daily. Please keep schedule appointment for future refills. Thank you. 11/28/22   Tereso Newcomer T, PA-C  metoprolol tartrate (LOPRESSOR) 50 MG tablet TAKE 1 TABLET BY MOUTH TWICE A DAY 07/24/23   Nahser, Deloris Ping, MD  nitroGLYCERIN (NITROSTAT) 0.4 MG SL tablet Place 1 tablet (0.4 mg total) under the tongue every 5 (five) minutes as needed for chest pain. 10/03/22   Nahser, Deloris Ping, MD  omeprazole (PRILOSEC) 20 MG capsule Take 20 mg by mouth daily after breakfast.    [provider]  ONETOUCH VERIO test strip 1 EACH BY OTHER ROUTE DAILY IN THE AFTERNOON. USE AS INSTRUCTED 07/02/23   Shamleffer, Konrad Dolores, MD  potassium chloride SA (KLOR-CON M20) 20 MEQ tablet TAKE 1  TABLET BY MOUTH EVERY DAY 11/23/22   Nahser, Deloris Ping, MD  predniSONE (DELTASONE) 5 MG tablet Take 5 mg by mouth 2 (two) times daily.    [provider]  rosuvastatin (CRESTOR) 40 MG tablet Take 1 tablet (40 mg total) by mouth daily. Pt needs to make appt with provider for further refills - 2nd attempt 08/31/22   Nahser, Deloris Ping, MD  sennosides-docusate sodium (SENOKOT-S) 8.6-50 MG tablet Take 1 tablet by mouth daily as needed (constipation.).     [provider]  tacrolimus (PROGRAF) 1 MG  capsule Take 5 mg by mouth See admin instructions. Take 5 mg by mouth at 9 AM and 5 mg at 9 PM    [provider]  Tiotropium Bromide Monohydrate 1.25 MCG/ACT AERS Inhale 1 puff into the lungs daily. 10/23/17   [provider]      Allergies    Sodium hypochlorite, Lactose, Asa arthritis strength-antacid [aspirin buffered], Aspirin, Atorvastatin, Banana, Lactose intolerance (gi), and Lisinopril    Review of Systems   Review of Systems  All other systems reviewed and are negative.   Physical Exam Updated Vital Signs BP (!) 158/68   Pulse 87   Temp 97.9 F (36.6 C) (Axillary)   Resp 20   Ht 5\' 2"  (1.575 m)   Wt 63 kg   LMP 03/04/2022   SpO2 90%   BMI 25.42 kg/m  Physical Exam Vitals and nursing note reviewed.  Constitutional:      General: She is not in acute distress.    Appearance: Normal appearance. She is well-developed.     Comments: Alert, mild to moderate respiratory distress  HENT:     Head: Normocephalic and atraumatic.  Eyes:     Conjunctiva/sclera: Conjunctivae normal.     Pupils: Pupils are equal, round, and reactive to light.  Cardiovascular:     Rate and Rhythm: Normal rate and regular rhythm.     Heart sounds: Normal heart sounds.  Pulmonary:     Effort: Tachypnea and respiratory distress present.     Comments: Diffuse bilateral expiratory wheezes in all lung fields Abdominal:     General: There is no distension.     Palpations: Abdomen is soft.     Tenderness: There is no abdominal tenderness.  Musculoskeletal:        General: No deformity. Normal range of motion.     Cervical back: Normal range of motion and neck supple.  Skin:    General: Skin is warm and dry.  Neurological:     General: No focal deficit present.     Mental Status: She is alert and oriented to person, place, and time.     ED Results / Procedures / Treatments   Labs (all labs ordered are listed, but only abnormal results are displayed) Labs Reviewed  CBC  WITH DIFFERENTIAL/PLATELET - Abnormal; Notable for the following components:      Result Value   RBC 5.21 (*)    MCV 71.4 (*)    MCH 23.2 (*)    RDW 18.9 (*)    Platelets 143 (*)    Monocytes Absolute 1.1 (*)    All other components within normal limits  COMPREHENSIVE METABOLIC PANEL - Abnormal; Notable for the following components:   Glucose, Bld 133 (*)    Creatinine, Ser 1.68 (*)    Total Protein 6.0 (*)    Albumin 3.0 (*)    GFR, Estimated 31 (*)    All other components within normal  limits  BRAIN NATRIURETIC PEPTIDE - Abnormal; Notable for the following components:   B Natriuretic Peptide 1,251.6 (*)    All other components within normal limits  I-STAT VENOUS BLOOD GAS, ED - Abnormal; Notable for the following components:   pCO2, Ven 40.4 (*)    pO2, Ven 54 (*)    Acid-base deficit 3.0 (*)    All other components within normal limits  TROPONIN I (HIGH SENSITIVITY) - Abnormal; Notable for the following components:   Troponin I (High Sensitivity) 22 (*)    All other components within normal limits  RESP PANEL BY RT-PCR (RSV, FLU A&B, COVID)  RVPGX2  I-STAT CG4 LACTIC ACID, ED    EKG EKG Interpretation Date/Time:  Sunday August 05 2023 11:33:01 EDT Ventricular Rate:  88 PR Interval:  169 QRS Duration:  97 QT Interval:  398 QTC Calculation: 482 R Axis:   38  Text Interpretation: Sinus rhythm Abnormal R-wave progression, early transition Confirmed by Kristine Royal [phone]) on 08/05/2023 11:33:55 AM  Radiology DG Chest Port 1 View  Result Date: 08/05/2023 CLINICAL DATA:  Shortness of breath with cough over the past 3 days. EXAM: PORTABLE CHEST 1 VIEW COMPARISON:  12/18/2019 FINDINGS: Sternotomy wires unchanged. Lungs are adequately inflated with mild hazy prominence of the central pulmonary vessels suggesting mild vascular congestion. No focal lobar consolidation or effusion. Borderline cardiomegaly. Remainder of the exam is unchanged. IMPRESSION: Borderline  cardiomegaly with findings suggesting mild vascular congestion. Electronically Signed   By: Elberta Fortis M.D.   On: 08/05/2023 11:58    Procedures Procedures    Medications Ordered in ED Medications - No data to display  ED Course/ Medical Decision Making/ A&P                                 Medical Decision Making Amount and/or Complexity of Data Reviewed Labs: ordered. Radiology: ordered.  Risk Decision regarding hospitalization.    Medical Screen Complete  This patient presented to the ED with complaint of shortness of breath.  This complaint involves an extensive number of treatment options. The initial differential diagnosis includes, but is not limited to, COPD exacerbation, pneumonia, metabolic abnormality, ACS, etc.  This presentation is: Acute, Chronic, Self-Limited, Previously Undiagnosed, Uncertain Prognosis, Complicated, Systemic Symptoms, and Threat to Life/Bodily Function  Patient with known history of COPD.  Presentation today is most consistent with COPD exacerbation.  Patient is improving with EMS treatment alone.  However, patient would benefit from admission for further workup and treatment.  Admitting team is aware of case and evaluate for same.   Additional history obtained:  External records from outside sources obtained and reviewed including prior ED visits and prior Inpatient records.    Lab Tests:  I ordered and personally interpreted labs.  The pertinent results include: CBC, CMP, COVID, flu, BNP, VBG, troponin   Imaging Studies ordered:  I ordered imaging studies including chest x-ray I independently visualized and interpreted obtained imaging which showed borderline cardiomegaly with findings suggestive of mild vascular congestion I agree with the radiologist interpretation.   Cardiac Monitoring:  The patient was maintained on a cardiac monitor.  I personally viewed and interpreted the cardiac monitor which showed an underlying  rhythm of: NSR  Problem List / ED Course:  Dyspnea   Reevaluation:  After the interventions noted above, I reevaluated the patient and found that they have: improved   Disposition:  After consideration of the diagnostic  results and the patients response to treatment, I feel that the patent would benefit from admission.          Final Clinical Impression(s) / ED Diagnoses Final diagnoses:  Dyspnea, unspecified type    Rx / DC Orders ED Discharge Orders     None         Wynetta Fines, MD 08/05/23 1554

## 2023-08-05 NOTE — Plan of Care (Signed)

## 2023-08-05 NOTE — ED Notes (Signed)
X-ray at bedside

## 2023-08-06 DIAGNOSIS — D631 Anemia in chronic kidney disease: Secondary | ICD-10-CM | POA: Diagnosis present

## 2023-08-06 DIAGNOSIS — J9621 Acute and chronic respiratory failure with hypoxia: Secondary | ICD-10-CM | POA: Diagnosis present

## 2023-08-06 DIAGNOSIS — Z9981 Dependence on supplemental oxygen: Secondary | ICD-10-CM | POA: Diagnosis not present

## 2023-08-06 DIAGNOSIS — Z1152 Encounter for screening for COVID-19: Secondary | ICD-10-CM | POA: Diagnosis not present

## 2023-08-06 DIAGNOSIS — M19012 Primary osteoarthritis, left shoulder: Secondary | ICD-10-CM | POA: Diagnosis present

## 2023-08-06 DIAGNOSIS — Y830 Surgical operation with transplant of whole organ as the cause of abnormal reaction of the patient, or of later complication, without mention of misadventure at the time of the procedure: Secondary | ICD-10-CM | POA: Diagnosis present

## 2023-08-06 DIAGNOSIS — K219 Gastro-esophageal reflux disease without esophagitis: Secondary | ICD-10-CM | POA: Diagnosis present

## 2023-08-06 DIAGNOSIS — D84821 Immunodeficiency due to drugs: Secondary | ICD-10-CM | POA: Diagnosis present

## 2023-08-06 DIAGNOSIS — T8619 Other complication of kidney transplant: Secondary | ICD-10-CM | POA: Diagnosis present

## 2023-08-06 DIAGNOSIS — J441 Chronic obstructive pulmonary disease with (acute) exacerbation: Principal | ICD-10-CM

## 2023-08-06 DIAGNOSIS — Z87891 Personal history of nicotine dependence: Secondary | ICD-10-CM | POA: Diagnosis not present

## 2023-08-06 DIAGNOSIS — N179 Acute kidney failure, unspecified: Secondary | ICD-10-CM | POA: Diagnosis present

## 2023-08-06 DIAGNOSIS — Z8249 Family history of ischemic heart disease and other diseases of the circulatory system: Secondary | ICD-10-CM | POA: Diagnosis not present

## 2023-08-06 DIAGNOSIS — I129 Hypertensive chronic kidney disease with stage 1 through stage 4 chronic kidney disease, or unspecified chronic kidney disease: Secondary | ICD-10-CM | POA: Diagnosis present

## 2023-08-06 DIAGNOSIS — Z951 Presence of aortocoronary bypass graft: Secondary | ICD-10-CM | POA: Diagnosis not present

## 2023-08-06 DIAGNOSIS — I252 Old myocardial infarction: Secondary | ICD-10-CM | POA: Diagnosis not present

## 2023-08-06 DIAGNOSIS — N1831 Chronic kidney disease, stage 3a: Secondary | ICD-10-CM | POA: Diagnosis present

## 2023-08-06 DIAGNOSIS — I2510 Atherosclerotic heart disease of native coronary artery without angina pectoris: Secondary | ICD-10-CM | POA: Diagnosis present

## 2023-08-06 DIAGNOSIS — Z940 Kidney transplant status: Secondary | ICD-10-CM | POA: Diagnosis not present

## 2023-08-06 DIAGNOSIS — K5900 Constipation, unspecified: Secondary | ICD-10-CM | POA: Diagnosis present

## 2023-08-06 DIAGNOSIS — Z79899 Other long term (current) drug therapy: Secondary | ICD-10-CM | POA: Diagnosis not present

## 2023-08-06 DIAGNOSIS — M810 Age-related osteoporosis without current pathological fracture: Secondary | ICD-10-CM | POA: Diagnosis present

## 2023-08-06 DIAGNOSIS — E1122 Type 2 diabetes mellitus with diabetic chronic kidney disease: Secondary | ICD-10-CM | POA: Diagnosis present

## 2023-08-06 DIAGNOSIS — E785 Hyperlipidemia, unspecified: Secondary | ICD-10-CM | POA: Diagnosis present

## 2023-08-06 DIAGNOSIS — Z7960 Long term (current) use of unspecified immunomodulators and immunosuppressants: Secondary | ICD-10-CM | POA: Diagnosis not present

## 2023-08-06 DIAGNOSIS — Z66 Do not resuscitate: Secondary | ICD-10-CM | POA: Diagnosis present

## 2023-08-06 DIAGNOSIS — R0602 Shortness of breath: Secondary | ICD-10-CM | POA: Diagnosis present

## 2023-08-06 LAB — CBC WITH DIFFERENTIAL/PLATELET
Abs Immature Granulocytes: 0 10*3/uL (ref 0.00–0.07)
Basophils Absolute: 0 10*3/uL (ref 0.0–0.1)
Basophils Relative: 0 %
Eosinophils Absolute: 0 10*3/uL (ref 0.0–0.5)
Eosinophils Relative: 0 %
HCT: 34.4 % — ABNORMAL LOW (ref 36.0–46.0)
Hemoglobin: 11.8 g/dL — ABNORMAL LOW (ref 12.0–15.0)
Lymphocytes Relative: 5 %
Lymphs Abs: 0.3 10*3/uL — ABNORMAL LOW (ref 0.7–4.0)
MCH: 24.6 pg — ABNORMAL LOW (ref 26.0–34.0)
MCHC: 34.3 g/dL (ref 30.0–36.0)
MCV: 71.8 fL — ABNORMAL LOW (ref 80.0–100.0)
Monocytes Absolute: 0.3 10*3/uL (ref 0.1–1.0)
Monocytes Relative: 4 %
Neutro Abs: 5.7 10*3/uL (ref 1.7–7.7)
Neutrophils Relative %: 91 %
Platelets: 138 10*3/uL — ABNORMAL LOW (ref 150–400)
RBC: 4.79 MIL/uL (ref 3.87–5.11)
RDW: 18.5 % — ABNORMAL HIGH (ref 11.5–15.5)
WBC: 6.3 10*3/uL (ref 4.0–10.5)
nRBC: 0 % (ref 0.0–0.2)
nRBC: 0 /100{WBCs}

## 2023-08-06 LAB — RENAL FUNCTION PANEL
Albumin: 3.1 g/dL — ABNORMAL LOW (ref 3.5–5.0)
Anion gap: 12 (ref 5–15)
BUN: 32 mg/dL — ABNORMAL HIGH (ref 8–23)
CO2: 20 mmol/L — ABNORMAL LOW (ref 22–32)
Calcium: 9 mg/dL (ref 8.9–10.3)
Chloride: 105 mmol/L (ref 98–111)
Creatinine, Ser: 1.97 mg/dL — ABNORMAL HIGH (ref 0.44–1.00)
GFR, Estimated: 25 mL/min — ABNORMAL LOW (ref >60)
Glucose, Bld: 234 mg/dL — ABNORMAL HIGH (ref 70–99)
Phosphorus: 4.6 mg/dL (ref 2.5–4.6)
Potassium: 4.6 mmol/L (ref 3.5–5.1)
Sodium: 137 mmol/L (ref 135–145)

## 2023-08-06 LAB — GLUCOSE, CAPILLARY: Glucose-Capillary: 160 mg/dL — ABNORMAL HIGH (ref 70–99)

## 2023-08-06 MED ORDER — GUAIFENESIN ER 600 MG PO TB12
1200.0000 mg | ORAL_TABLET | Freq: Two times a day (BID) | ORAL | Status: DC
  Administered 2023-08-06 – 2023-08-08 (×4): 1200 mg via ORAL
  Filled 2023-08-06 (×4): qty 2

## 2023-08-06 MED ORDER — IPRATROPIUM-ALBUTEROL 0.5-2.5 (3) MG/3ML IN SOLN
3.0000 mL | RESPIRATORY_TRACT | Status: DC | PRN
  Administered 2023-08-06: 3 mL via RESPIRATORY_TRACT
  Filled 2023-08-06: qty 3

## 2023-08-06 MED ORDER — IPRATROPIUM-ALBUTEROL 0.5-2.5 (3) MG/3ML IN SOLN
3.0000 mL | Freq: Four times a day (QID) | RESPIRATORY_TRACT | Status: DC
  Administered 2023-08-06 – 2023-08-07 (×3): 3 mL via RESPIRATORY_TRACT
  Filled 2023-08-06 (×3): qty 3

## 2023-08-06 MED ORDER — GUAIFENESIN ER 600 MG PO TB12
600.0000 mg | ORAL_TABLET | Freq: Once | ORAL | Status: AC
  Administered 2023-08-06: 600 mg via ORAL
  Filled 2023-08-06: qty 1

## 2023-08-06 NOTE — Evaluation (Signed)
Occupational Therapy Evaluation Patient Details Name: [name] MRN: [id number] DOB: 08/05/1941 Today's Date: 08/06/2023   History of Present Illness Pt is an 82 year old woman admitted on 9/15 with shortness of breath and diffuse chest pain. Admitted for acute on chronic respiratory failure. PMH: COPD on 2-3L O2 at home, asthma, CAD, MI, CABG, kidney transplant, DM, gout.   Clinical Impression   Pt's grandson resides with her and works until 10pm at night. Pt walks with a cane and the furniture in her home. She sits to shower and is modified independent in ADLs and IADLs.She does receive help with heavy housekeeping. She reports it requires an excessive amount of time for her to complete activities at home due to need for multiple rest breaks and pacing. Pt presents with decreased activity tolerance and shortness of breath with ambulation to bathroom with RW. Requires 6L O2 with exertion to maintain O2 sats >88%. Placed on 4L upon return to chair with Sp02 of 93%. She overall requires set up to supervision for ADLs and mobility. Of note: pt with cramping in her L hand at end of session, reports this was commonplace when she was on HD, but has not happened since. Used warm pack. Will follow acutely, but do not anticipate pt will need post acute OT.       If plan is discharge home, recommend the following: Assistance with cooking/housework;Help with stairs or ramp for entrance;Assist for transportation    Functional Status Assessment  Patient has had a recent decline in their functional status and demonstrates the ability to make significant improvements in function in a reasonable and predictable amount of time.  Equipment Recommendations  None recommended by OT    Recommendations for Other Services       Precautions / Restrictions Precautions Precautions: Fall Precaution Comments: watch O2 Restrictions Weight Bearing Restrictions: No      Mobility Bed Mobility                General bed mobility comments: received in chair    Transfers Overall transfer level: Needs assistance Equipment used: Rolling walker (2 wheels) Transfers: Sit to/from Stand Sit to Stand: Supervision           General transfer comment: for safety and O2 line      Balance Overall balance assessment: Needs assistance   Sitting balance-Leahy Scale: Good       Standing balance-Leahy Scale: Fair Standing balance comment: can release walker in static standing                           ADL either performed or assessed with clinical judgement   ADL Overall ADL's : Needs assistance/impaired Eating/Feeding: Sitting;Set up Eating/Feeding Details (indicate cue type and reason): assist to open containers Grooming: Standing;Supervision/safety;Wash/dry hands   Upper Body Bathing: Set up;Sitting   Lower Body Bathing: Supervison/ safety;Sit to/from stand   Upper Body Dressing : Set up;Sitting   Lower Body Dressing: Supervision/safety;Sit to/from stand   Toilet Transfer: Supervision/safety;Ambulation;Rolling walker (2 wheels);Comfort height toilet   Toileting- Clothing Manipulation and Hygiene: Supervision/safety;Sit to/from stand       Functional mobility during ADLs: Supervision/safety;Rolling walker (2 wheels) General ADL Comments: Pt is knowledgeable in energy conservtion and pursed lip breathing.     Vision Baseline Vision/History: 1 Wears glasses Ability to See in Adequate Light: 0 Adequate Patient Visual Report: No change from baseline       Perception  Praxis         Pertinent Vitals/Pain Pain Assessment Pain Assessment: Faces Faces Pain Scale: Hurts even more Pain Location: L hand Pain Descriptors / Indicators: Guarding, Grimacing, Sharp, Spasm Pain Intervention(s): Heat applied, Repositioned     Extremity/Trunk Assessment Upper Extremity Assessment Upper Extremity Assessment: Overall WFL for tasks assessed;Generalized weakness  (hand cramping with attempt to open honey)   Lower Extremity Assessment Lower Extremity Assessment: Defer to PT evaluation       Communication Communication Communication: No apparent difficulties   Cognition Arousal: Alert Behavior During Therapy: WFL for tasks assessed/performed Overall Cognitive Status: Within Functional Limits for tasks assessed                                       General Comments       Exercises     Shoulder Instructions      Home Living Family/patient expects to be discharged to:: Private residence Living Arrangements: Other relatives (30 year old grandson) Available Help at Discharge: Family;Available PRN/intermittently Type of Home: House       Home Layout: One level     Bathroom Shower/Tub: Chief Strategy Officer: Handicapped height     Home Equipment: Agricultural consultant (2 wheels);Tub bench;Hand held shower head;Cane - single point;Rollator (4 wheels);Other (comment) (O2)          Prior Functioning/Environment Prior Level of Function : Independent/Modified Independent                        OT Problem List: Decreased activity tolerance;Impaired balance (sitting and/or standing);Decreased strength;Cardiopulmonary status limiting activity;Pain      OT Treatment/Interventions: Self-care/ADL training;DME and/or AE instruction;Therapeutic activities;Patient/family education;Balance training    OT Goals(Current goals can be found in the care plan section) Acute Rehab OT Goals OT Goal Formulation: With patient Time For Goal Achievement: 08/27/23 Potential to Achieve Goals: Good ADL Goals Pt Will Perform Grooming: with modified independence;standing (2 activities) Pt Will Perform Lower Body Bathing: with modified independence;sit to/from stand Pt Will Perform Lower Body Dressing: with modified independence;sit to/from stand Pt Will Transfer to Toilet: with modified independence;ambulating;regular height  toilet Pt Will Perform Toileting - Clothing Manipulation and hygiene: with modified independence;sit to/from stand Pt/caregiver will Perform Home Exercise Program: Increased strength;Both right and left upper extremity;With theraband;With written HEP provided (level 2)  OT Frequency: Min 1X/week    Co-evaluation              AM-PAC OT "6 Clicks" Daily Activity     Outcome Measure Help from another person eating meals?: A Little Help from another person taking care of personal grooming?: A Little Help from another person toileting, which includes using toliet, bedpan, or urinal?: A Little Help from another person bathing (including washing, rinsing, drying)?: A Little Help from another person to put on and taking off regular upper body clothing?: A Little Help from another person to put on and taking off regular lower body clothing?: A Little 6 Click Score: 18   End of Session Equipment Utilized During Treatment: Gait belt;Rolling walker (2 wheels);Oxygen  Activity Tolerance: Patient limited by fatigue Patient left: in chair;with call bell/phone within reach;with chair alarm set  OT Visit Diagnosis: Unsteadiness on feet (R26.81);Other abnormalities of gait and mobility (R26.89);Muscle weakness (generalized) (M62.81);Other (comment) (decreased activity tolerance)  Time: 1135-1207 OT Time Calculation (min): 32 min Charges:  OT General Charges $OT Visit: 1 Visit OT Evaluation $OT Eval Low Complexity: 1 Low OT Treatments $Self Care/Home Management : 8-22 mins Berna Spare, OTR/L Acute Rehabilitation Services Office: [phone]   Evern Bio 08/06/2023, 1:58 PM

## 2023-08-06 NOTE — Evaluation (Signed)
Physical Therapy Evaluation Patient Details Name: [name] MRN: [id number] DOB: Sep 23, 1941 Today's Date: 08/06/2023  History of Present Illness  Pt is an 82 year old woman admitted on 9/15 with shortness of breath and diffuse chest pain. Admitted for acute on chronic respiratory failure. PMH: COPD on 2-3L O2 at home, asthma, CAD, MI, CABG, kidney transplant, DM, gout.  Clinical Impression   Pt admitted with above diagnosis. Lives at home with her grandson, in a single-level home with a ramped entrance; Pt's grandson resides with her and works until 10pm at night. Pt walks with a cane and the furniture in her home. She sits to shower and is modified independent in ADLs and IADLs.She does receive help with heavy housekeeping. She reports it requires an excessive amount of time for her to complete activities at home due to need for multiple rest breaks and pacing. Pt presents with decreased activity tolerance and shortness of breath with ambulation/activity; O2 sats dropped to 82% with amb on 4L supplemental O2, titrated to 6L, and needed about 4 minutes of seated rest to recover;  Pt currently with functional limitations due to the deficits listed below (see PT Problem List). Pt will benefit from skilled PT to increase their independence and safety with mobility to allow discharge to the venue listed below.       Will benefit from HHPT follow up; Also agree it is worth considering Outpt Pulmonary Rehab, but not sure that she can do both HHPT and Outpt Pulm Rehab at the same time -- will follow for progress; possible that she may not need HHPT at time of DC; Will also find out which pt will prefer;   Patient Saturations on 4 L supplemental O2while Ambulating = 82%; Titrated up to 6L on the way back to the room;   Needed approx 4 minutes of seated rest to recover      If plan is discharge home, recommend the following: A little help with bathing/dressing/bathroom;Assistance with  cooking/housework   Can travel by private vehicle        Equipment Recommendations None recommended by PT (pt has a rollator RW)  Recommendations for Other Services  Other (comment) (TOC; can we reconnect pt with pulmonary rehab?)    Functional Status Assessment Patient has had a recent decline in their functional status and demonstrates the ability to make significant improvements in function in a reasonable and predictable amount of time.     Precautions / Restrictions Precautions Precautions: Fall Precaution Comments: watch O2 Restrictions Weight Bearing Restrictions: No      Mobility  Bed Mobility Overal bed mobility: Needs Assistance Bed Mobility: Supine to Sit     Supine to sit: Contact guard     General bed mobility comments: contact guard for safety; assist for O2 line management    Transfers Overall transfer level: Needs assistance Equipment used: Rolling walker (2 wheels) Transfers: Sit to/from Stand Sit to Stand: Supervision           General transfer comment: for safety and O2 line    Ambulation/Gait Ambulation/Gait assistance: Contact guard assist Gait Distance (Feet): 80 Feet Assistive device:  (pushed vitals machine) Gait Pattern/deviations: Decreased step length - right, Decreased step length - left Gait velocity: slowed     General Gait Details: Pt pushed vitals machine to simulate using RW; initiated walk on 4L supplemental O2, and sats dropped to 82% (observed lowest), increased supplemental O2 to 6L for remainder of walk, with little noted incr in O2  sats; syspnea 3/4  Stairs            Wheelchair Mobility     Tilt Bed    Modified Rankin (Stroke Patients Only)       Balance Overall balance assessment: Needs assistance   Sitting balance-Leahy Scale: Good       Standing balance-Leahy Scale: Fair Standing balance comment: can release walker in static standing                             Pertinent  Vitals/Pain Pain Assessment Pain Assessment: Faces Faces Pain Scale: No hurt Pain Intervention(s): Monitored during session    Home Living Family/patient expects to be discharged to:: Private residence Living Arrangements: Other relatives (49 year old grandson) Available Help at Discharge: Family;Available PRN/intermittently Type of Home: House         Home Layout: One level Home Equipment: Agricultural consultant (2 wheels);Tub bench;Hand held shower head;Cane - single point;Rollator (4 wheels);Other (comment) (O2)      Prior Function Prior Level of Function : Independent/Modified Independent                     Extremity/Trunk Assessment   Upper Extremity Assessment Upper Extremity Assessment: Defer to OT evaluation    Lower Extremity Assessment Lower Extremity Assessment: Generalized weakness       Communication   Communication Communication: No apparent difficulties  Cognition Arousal: Alert Behavior During Therapy: WFL for tasks assessed/performed Overall Cognitive Status: Within Functional Limits for tasks assessed                                          General Comments      Exercises     Assessment/Plan    PT Assessment Patient needs continued PT services  PT Problem List Decreased strength;Decreased activity tolerance;Decreased balance;Decreased mobility;Decreased knowledge of use of DME;Decreased safety awareness;Decreased knowledge of precautions;Cardiopulmonary status limiting activity       PT Treatment Interventions DME instruction;Gait training;Functional mobility training;Therapeutic activities;Therapeutic exercise;Balance training;Patient/family education    PT Goals (Current goals can be found in the Care Plan section)  Acute Rehab PT Goals Patient Stated Goal: HOme soon PT Goal Formulation: With patient Time For Goal Achievement: 08/20/23 Potential to Achieve Goals: Good    Frequency Min 1X/week     Co-evaluation                AM-PAC PT "6 Clicks" Mobility  Outcome Measure Help needed turning from your back to your side while in a flat bed without using bedrails?: None Help needed moving from lying on your back to sitting on the side of a flat bed without using bedrails?: None Help needed moving to and from a bed to a chair (including a wheelchair)?: A Little Help needed standing up from a chair using your arms (e.g., wheelchair or bedside chair)?: A Little Help needed to walk in hospital room?: A Little Help needed climbing 3-5 steps with a railing? : A Lot 6 Click Score: 19    End of Session Equipment Utilized During Treatment: Oxygen;Gait belt Activity Tolerance: Patient tolerated treatment well Patient left: in chair;with call bell/phone within reach;with chair alarm set Nurse Communication: Mobility status;Other (comment) (supplemenmtal O2 needs) PT Visit Diagnosis: Other abnormalities of gait and mobility (R26.89);Other (comment) (decr functional capacity)    Time: 9528-4132 PT  Time Calculation (min) (ACUTE ONLY): 26 min   Charges:   PT Evaluation $PT Eval Moderate Complexity: 1 Mod PT Treatments $Gait Training: 8-22 mins PT General Charges $$ ACUTE PT VISIT: 1 Visit         Van Clines, PT  Acute Rehabilitation Services Office [phone] Secure Chat welcomed   Levi Aland 08/06/2023, 3:21 PM

## 2023-08-06 NOTE — Progress Notes (Signed)
HD#0 SUBJECTIVE:  Patient Summary: [name] is a 82 y.o. with a pertinent PMH of COPD on 2 to 3 L oxygen at baseline, CAD with previous MI s/p CABG in 2001, kidney transplant on immunosuppression, hypertension , who presented with Dyspnea and chest pain and admitted for Acute on Chronic Hypoxic Respiratory Failure.   Overnight Events: None. Modified tacrolimus to 4mg  BID and added azithromycin per night team.  Interim History: Denies fever chills, NV. Continued cough. Feels the same as yesterday. Walked with Pt today. Trouble getting her sputum mobilized.  OBJECTIVE:  Vital Signs: Vitals:   08/05/23 2035 08/06/23 0453 08/06/23 0454 08/06/23 0458  BP:  (!) 142/65    Pulse:  87    Resp:  20    Temp:  98.4 F (36.9 C)    TempSrc:  Oral    SpO2: 95% (!) 89% 92%   Weight:    62.1 kg  Height:       Supplemental O2: Nasal Cannula SpO2: 92 % O2 Flow Rate (L/min): 4 L/min  Filed Weights   08/05/23 1144 08/06/23 0458  Weight: 63 kg 62.1 kg     Intake/Output Summary (Last 24 hours) at 08/06/2023 0656 Last data filed at 08/06/2023 0200 Gross per 24 hour  Intake 120 ml  Output 500 ml  Net -380 ml   Net IO Since Admission: -380 mL [08/06/23 0656]  Physical Exam: Physical Exam Constitutional:      General: She is not in acute distress.    Appearance: She is well-developed. She is not ill-appearing.  Cardiovascular:     Rate and Rhythm: Normal rate and regular rhythm.     Pulses: Normal pulses.     Heart sounds: Normal heart sounds.  Pulmonary:     Effort: Pulmonary effort is normal. No tachypnea, accessory muscle usage or respiratory distress.     Breath sounds: Wheezing present. No rhonchi or rales.  Musculoskeletal:     Right lower leg: No edema.     Left lower leg: No edema.  Skin:    General: Skin is warm and dry.  Neurological:     General: No focal deficit present.     Mental Status: She is alert and oriented to person, place, and time.  Psychiatric:         Mood and Affect: Mood normal.        Behavior: Behavior normal.     Patient Lines/Drains/Airways Status     Active Line/Drains/Airways     Name Placement date Placement time Site Days   Peripheral IV 08/05/23 20 G Anterior;Right Forearm 08/05/23  1145  Forearm  1   Fistula / Graft Left Forearm Arteriovenous vein graft --  --  Forearm  --             ASSESSMENT/PLAN:  Assessment: Principal Problem:   Acute on chronic hypoxic respiratory failure (HCC)  [name] is a 82 y.o. female with pertinent PMH of COPD on 2 to 3 L oxygen at baseline, CAD with previous MI requiring open heart surgery, kidney transplant on immunosuppression who presented with chest pain and dyspnea and is admitted for acute on chronic hypoxic respiratory failure.   Plan: Acute on Chronic Hypoxic Respiratory Failure COPD Exacerbation Possible acute congestive heart failure, previous MI Currently - patient requiring oxygen beyond her baseline of 2L in setting progressive dyspnea with activity over three months and sudden worsening at rest on Sunday morning. She remains wheezy but is responding to duonebs  and steroids. Airways sound less congested. Still no indication for antibiotics/infection, but will start azithromycin for inflammation. - CXR without focal infiltrate, but with vascular congestion - Covid/Flu/RSV neg - BNP 1251, she had gentle diuresis with lasix 20 yesterday, hold now. Euvolemic on exam. PLAN: - supplemental oxygen with goal 88-92%, she does not need to be in upper 90s given COPD, decrease as able - DuoNebs every 6 hours - Mucinex 1200mg  BID - Azithromycin 500mg  daily x 3 days - Prednisone 40 x 5 days, today is day 2 - Triple therapy inhalers daily - strict Is/Os and daily weights, put out 1/2 L yesterday net neg -330 - Will attempt to connect patient with pulmonary rehab. She appears to have done this last December.  CAD/previous MI with CABG in 2001 Complained of chest  pain at admission, but non-cardiac and likely related to her respiration. Importantly, troponins of 22 and 22 at admission. Most recent ECHO in 04/2023 showed EF 55-60% and mild concentric LV hypertrophy and indeterminate diastolic parameters, mild-moderate RA/LA dilation, stable. 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  Baseline appears to fluctuate between 1 and 2.  She is not complaining of urinary symptoms, fevers, or pain to arouse concern of acute issues.  Will continue to monitor renal status in this patient with a transplanted kidney. Tacrolimus might cause pulmonary edema, so some diuresis yesterday, but stopping now. - on Prednisone 40 mg x 5 days for COPD exacerbation, will need to return to 5mg  twice daily after - continue Tacrolimus 4 mg twice daily (pt clarified she is on 4mg  BID, not 5mg  in her chart) - continue Calcitriol 0.25 mcg daily   GERD - continue Omeprazole 20 mg daily   Constipation - continue Senokot 8.6-50 mg as needed    HTN - continue Amlodipine 10 mg daily - continue Lopressor 50 mg twice a day - hold Potassium chloride 20 mEq daily, potassium is 4.6 normal, will monitor.  Best Practice: Diet: Carb modified IVF: none VTE: enoxaparin (LOVENOX) injection 30 mg Start: 08/05/23 2000 Code: DNR/DNI AB: Azithromycin 500 x 3 days for anti-inflammatory Therapy Recs: Pending DISPO: Anticipated discharge  9/17 or 9/18  to Home pending  respiratory improvement .  Signature: [name], D.O.  Internal Medicine Resident, PGY-1 [facility]  Pager: [phone] 6:56 AM, 08/06/2023   Please contact the on call pager after 5 pm and on weekends at [phone].

## 2023-08-07 ENCOUNTER — Inpatient Hospital Stay (HOSPITAL_COMMUNITY): Payer: 59

## 2023-08-07 DIAGNOSIS — N179 Acute kidney failure, unspecified: Secondary | ICD-10-CM | POA: Diagnosis not present

## 2023-08-07 DIAGNOSIS — J9621 Acute and chronic respiratory failure with hypoxia: Secondary | ICD-10-CM | POA: Diagnosis not present

## 2023-08-07 DIAGNOSIS — J441 Chronic obstructive pulmonary disease with (acute) exacerbation: Secondary | ICD-10-CM | POA: Diagnosis not present

## 2023-08-07 DIAGNOSIS — Z940 Kidney transplant status: Secondary | ICD-10-CM

## 2023-08-07 LAB — CBC
HCT: 35.1 % — ABNORMAL LOW (ref 36.0–46.0)
Hemoglobin: 11.5 g/dL — ABNORMAL LOW (ref 12.0–15.0)
MCH: 23.5 pg — ABNORMAL LOW (ref 26.0–34.0)
MCHC: 32.8 g/dL (ref 30.0–36.0)
MCV: 71.6 fL — ABNORMAL LOW (ref 80.0–100.0)
Platelets: 160 10*3/uL (ref 150–400)
RBC: 4.9 MIL/uL (ref 3.87–5.11)
RDW: 18.6 % — ABNORMAL HIGH (ref 11.5–15.5)
WBC: 12.3 10*3/uL — ABNORMAL HIGH (ref 4.0–10.5)
nRBC: 0 % (ref 0.0–0.2)

## 2023-08-07 LAB — URINALYSIS, ROUTINE W REFLEX MICROSCOPIC
Bacteria, UA: NONE SEEN
Bilirubin Urine: NEGATIVE
Glucose, UA: NEGATIVE mg/dL
Ketones, ur: NEGATIVE mg/dL
Leukocytes,Ua: NEGATIVE
Nitrite: NEGATIVE
Protein, ur: 100 mg/dL — AB
Specific Gravity, Urine: 1.013 (ref 1.005–1.030)
pH: 5 (ref 5.0–8.0)

## 2023-08-07 LAB — RENAL FUNCTION PANEL
Albumin: 3.1 g/dL — ABNORMAL LOW (ref 3.5–5.0)
Anion gap: 9 (ref 5–15)
BUN: 48 mg/dL — ABNORMAL HIGH (ref 8–23)
CO2: 23 mmol/L (ref 22–32)
Calcium: 9.2 mg/dL (ref 8.9–10.3)
Chloride: 104 mmol/L (ref 98–111)
Creatinine, Ser: 2.5 mg/dL — ABNORMAL HIGH (ref 0.44–1.00)
GFR, Estimated: 19 mL/min — ABNORMAL LOW (ref >60)
Glucose, Bld: 125 mg/dL — ABNORMAL HIGH (ref 70–99)
Phosphorus: 5.1 mg/dL — ABNORMAL HIGH (ref 2.5–4.6)
Potassium: 5.2 mmol/L — ABNORMAL HIGH (ref 3.5–5.1)
Sodium: 136 mmol/L (ref 135–145)

## 2023-08-07 LAB — GLUCOSE, CAPILLARY: Glucose-Capillary: 137 mg/dL — ABNORMAL HIGH (ref 70–99)

## 2023-08-07 MED ORDER — SODIUM CHLORIDE 0.9 % IV SOLN: Freq: Once | INTRAVENOUS | Status: AC

## 2023-08-07 NOTE — Progress Notes (Signed)
HD#1 SUBJECTIVE:  Patient Summary: [name] is a 82 y.o. with a pertinent PMH of COPD on 2 to 3 L oxygen at baseline, CAD with previous MI s/p CABG in 2001, kidney transplant on immunosuppression, hypertension , who presented with Dyspnea and chest pain and admitted for Acute on Chronic Hypoxic Respiratory Failure.   Overnight Events: None  Interim History: She is feeling well. Breathing is improving. Still with occasional coughing spells. Was short of breath when she walked yesterday, has not repeated since. Drinking less water here than usual. Usually it's 1 cup per hour, now its 4 cups per day.  OBJECTIVE:  Vital Signs: Vitals:   08/07/23 0241 08/07/23 0502 08/07/23 0747 08/07/23 0920  BP:  (!) 138/56 133/68   Pulse:  75 82 91  Resp:  20 20 20   Temp:  98.1 F (36.7 C) 98.2 F (36.8 C)   TempSrc:  Oral    SpO2: 90% 98% 97% 93%  Weight:  65.4 kg    Height:       Supplemental O2: Nasal Cannula SpO2: 93 % O2 Flow Rate (L/min): 4 L/min FiO2 (%): 36 %  Filed Weights   08/05/23 1144 08/06/23 0458 08/07/23 0502  Weight: 63 kg 62.1 kg 65.4 kg     Intake/Output Summary (Last 24 hours) at 08/07/2023 1414 Last data filed at 08/07/2023 1115 Gross per 24 hour  Intake 240 ml  Output 1500 ml  Net -1260 ml   Net IO Since Admission: -1,620 mL [08/07/23 1414]  Physical Exam: Physical Exam Constitutional:      General: She is not in acute distress.    Appearance: She is well-developed. She is not ill-appearing.  Cardiovascular:     Rate and Rhythm: Normal rate and regular rhythm.     Pulses: Normal pulses.     Heart sounds: Normal heart sounds.  Pulmonary:     Effort: Pulmonary effort is normal. No respiratory distress.     Breath sounds: Wheezing present.  Abdominal:     General: Bowel sounds are normal.     Palpations: Abdomen is soft.  Skin:    General: Skin is warm and dry.  Neurological:     General: No focal deficit present.     Mental Status: She is alert  and oriented to person, place, and time.  Psychiatric:        Mood and Affect: Mood normal.        Behavior: Behavior normal.     Patient Lines/Drains/Airways Status     Active Line/Drains/Airways     Name Placement date Placement time Site Days   Peripheral IV 08/05/23 20 G Anterior;Right Forearm 08/05/23  1145  Forearm  2   Fistula / Graft Left Forearm Arteriovenous vein graft --  --  Forearm  --             ASSESSMENT/PLAN:  Assessment: Principal Problem:   Acute on chronic hypoxic respiratory failure (HCC) Active Problems:   COPD exacerbation (HCC)   AKI (acute kidney injury) (HCC)   Kidney transplant recipient  [name] is a 82 y.o. female with pertinent PMH of COPD on 2 to 3 L oxygen at baseline, CAD with previous MI requiring open heart surgery, kidney transplant on immunosuppression who presented with chest pain and dyspnea and is admitted for acute on chronic hypoxic respiratory failure.   Plan: Acute on Chronic Hypoxic Respiratory Failure COPD Exacerbation Improving. This morning we turned her oxygen down to 2L and she  saturated well. She remains wheezy with dyspnea on exertion but is responding to duonebs and steroids. Airways sound much less congested. Still no indication for antibiotics/infection, azithromycin is on for inflammation. - CXR without focal infiltrate, but with vascular congestion - Covid/Flu/RSV neg - BNP 1251, hold diuresis PLAN: - supplemental oxygen with goal 88-92%, she does not need to be in upper 90s given COPD, decrease as able - DuoNebs every 4 hours prn - Mucinex 1200mg  BID - Azithromycin 500mg  daily x 3 days, today is day 2 - Prednisone 40 x 5 days, today is day 3 - Triple therapy inhalers daily - strict Is/Os and daily weights - For pulmonary rehab, she will establish through her pulmonologist Dr. Melodye Ped   AKI on ESRD s/p DDK Transplant in Sep 2012 Creatinine today is 2.5 up from 1.6 on admission. Baseline appears to  fluctuate between 1 and 2. She is not complaining of urinary symptoms, fevers, or pain to arouse concern of acute issues. She does note decreased po intake relative to home. Here she is consuming approximately 30oz fluids per day. At home, she drinks at least 80oz (reportedly 1 cup of water per hour). Given creatinine and transplant, will consult nephrology. - Nephrology to see, thank you. Renal doppler pending. - 500 ns IV for fluids - on Prednisone 40 mg x 5 days for COPD exacerbation, will need to return to 5mg  twice daily after - continue Tacrolimus 4 mg twice daily (pt clarified she is on 4mg  BID, not 5mg  in her chart) - continue Calcitriol 0.25 mcg daily   Chronic Issues CAD/previous MI with CABG in 2001 Complained of chest pain at admission, but non-cardiac and likely related to her respiration. Importantly, troponins of 22 and 22 at admission. Most recent ECHO in 04/2023 showed EF 55-60% and mild concentric LV hypertrophy and indeterminate diastolic parameters, mild-moderate RA/LA dilation, stable. - continue Aspirin 81 mg daily - continue Ezetimibe 10 mg daily - continue Rosuvastatin 40 mg daily - continue Nitroglycerin 0.4 mg as needed - continue Imdur 30 mg daily   GERD - continue Omeprazole 20 mg daily   Constipation - continue Senokot 8.6-50 mg as needed    HTN - continue Amlodipine 10 mg daily - continue Lopressor 50 mg twice a day - hold Potassium chloride 20 mEq daily, potassium is 5.2 normal, will monitor.   Best Practice: Diet: Carb modified IVF: none VTE: enoxaparin (LOVENOX) injection 30 mg Start: 08/05/23 2000 Code: DNR/DNI AB: Azithromycin 500 x 3 days for anti-inflammatory Therapy Recs: Pending DISPO: Anticipated discharge  9/18 or 9/19  to Home pending  respiratory improvement and nephrology eval.  Signature: Katheran James, D.O.  Internal Medicine Resident, PGY-1 [facility]  Pager: [phone] 2:14 PM, 08/07/2023    Please contact the on call pager after 5 pm and on weekends at [phone].

## 2023-08-07 NOTE — TOC Initial Note (Addendum)
Transition of Care (TOC) - Initial/Assessment Note   Spoke to patient at bedside . Patient lives with grandson [name] . Confirmed face sheet information.   Patient has home oxygen with [employer] . She has concentrator and portable tanks. Patient reports her concentrator is loud and she fills her portable tanks and uses those. She would like [employer] to check her concentrator . Contact person is grandson Medical laboratory scientific officer . Left message with [name] with [employer].   Patient also has cane, rollator and tub bench at home.   Patient's pulmonary doctor is Dr [name] at [facility].   Patient agreeable to HHPT and has no preference . [name] with [name] accepted referral for HHPT  Patient Details  Name: [name] MRN: [id number] Date of Birth: 1941-01-03  Transition of Care [facility]) CM/SW Contact:    [name] Plan, RN Phone Number: 08/07/2023, 11:56 AM  Clinical Narrative:                   Expected Discharge Ward: Home w Home Health Services Barriers to Discharge: Continued Medical Work up   Patient Goals and CMS Choice Patient states their goals for this hospitalization and ongoing recovery are:: to return to home CMS Medicare.gov Compare Post Acute Care list provided to:: Patient Choice offered to / list presented to : Patient      Expected Discharge Ward and Services   Discharge Planning Services: CM Consult Post Acute Care Choice: Home Health Living arrangements for the past 2 months: Single Family Home                 DME Arranged: N/A         HH Arranged: PT          Prior Living Arrangements/Services Living arrangements for the past 2 months: Single Family Home Lives with:: Relatives (grandson [name]) Patient language and need for interpreter reviewed:: Yes Do you feel safe going back to the place where you live?: Yes      Need for Family Participation in Patient Care: Yes (Comment) Care giver support system in place?: Yes  (comment) Current home services: DME Criminal Activity/Legal Involvement Pertinent to Current Situation/Hospitalization: No - Comment as needed  Activities of Daily Living Home Assistive Devices/Equipment: Walker (specify type), Cane (specify quad or straight) ADL Screening (condition at time of admission) Patient's cognitive ability adequate to safely complete daily activities?: Yes Is the patient deaf or have difficulty hearing?: No Does the patient have difficulty seeing, even when wearing glasses/contacts?: No Does the patient have difficulty concentrating, remembering, or making decisions?: No Patient able to express need for assistance with ADLs?: Yes Does the patient have difficulty dressing or bathing?: No Independently performs ADLs?: Yes (appropriate for developmental age) Does the patient have difficulty walking or climbing stairs?: No Weakness of Legs: None Weakness of Arms/Hands: None  Permission Sought/Granted   Permission granted to share information with : Yes, Verbal Permission Granted     Permission granted to share info w AGENCY: [employer] and home health agencies        Emotional Assessment Appearance:: Appears stated age Attitude/Demeanor/Rapport: Engaged Affect (typically observed): Accepting Orientation: : Oriented to Self, Oriented to Place, Oriented to  Time, Oriented to Situation Alcohol / Substance Use: Not Applicable Psych Involvement: No (comment)  Admission diagnosis:  Dyspnea, unspecified type [R06.00] Acute on chronic hypoxic respiratory failure (HCC) [J96.21] Patient Active Problem List   Diagnosis Date Noted   COPD exacerbation (HCC) 08/06/2023  Acute on chronic hypoxic respiratory failure (HCC) 08/05/2023   History of renal transplantation 07/10/2022   Asymptomatic menopausal state 11/03/2021   Olecranon bursitis of right elbow 12/26/2019   Right arm cellulitis 12/18/2019   Fever    Hx of colonic polyps 09/30/2019   Family history  of breast cancer    Family history of prostate cancer    Family history of lung cancer 08/08/2018   Shoulder pain, right 04/04/2018   Severe persistent asthma with (acute) exacerbation 02/13/2018   GERD (gastroesophageal reflux disease) 02/12/2018   Severe persistent asthma with acute exacerbation 02/12/2018   Complete rotator cuff tear of 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   Subacromial impingement of 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   Primary osteoarthritis, 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   History of methicillin resistant staphylococcus aureus (MRSA)    Benign neoplasm of colon 07/14/2014   Nausea & vomiting 12/22/2013   Dyspnea 12/22/2013   Diabetes (HCC) 02/28/2013   Preop cardiovascular exam    Type II or unspecified type diabetes mellitus without mention of complication, not stated as uncontrolled 06/05/2012   Encounter for long-term (current) use of other medications 02/26/2012   Type II or unspecified type diabetes mellitus with renal manifestations, not stated as uncontrolled(250.40) 02/26/2012   S/P kidney transplant    H/O steroid therapy    Anemia    Coronary artery disease    Hyperlipidemia    Hypertension    Aspirin allergy    Hx of CABG    Ejection fraction    Anemia    GI bleed 11/26/2010   Pneumonia 08/20/2010   FOOT PAIN 01/20/2010   THROMBOCYTOPENIA 01/11/2009   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 - [address] [place] [address] Phone: [phone] Fax: [phone]  [facility] [place], [place] - [address] [place] [address] Phone: [phone] Fax: [phone]     Social Determinants of Health (SDOH) Social History: SDOH  Screenings   Food Insecurity: No Food Insecurity (08/05/2023)  Housing: Patient Declined (08/05/2023)  Transportation Needs: No Transportation Needs (08/05/2023)  Utilities: Not At Risk (08/05/2023)  Depression (PHQ2-9): Low Risk  (11/17/2022)  Financial Resource Strain: Low Risk  (02/19/2023)   Received from [facility], [facility]  Physical Activity: Sufficiently Active (08/09/2022)   Received from [facility], [facility]  Social Connections: Socially Integrated (08/09/2022)   Received from [facility], [facility]  Stress: No Stress Concern Present (08/09/2022)   Received from [facility], [facility]  Tobacco Use: Medium Risk (08/05/2023)   SDOH Interventions:     Readmission Risk Interventions     No data to display

## 2023-08-07 NOTE — Progress Notes (Signed)
Physical Therapy Treatment Patient Details Name: [name] MRN: [id number] DOB: 1940-12-02 Today's Date: 08/07/2023   History of Present Illness Pt is an 82 year old woman admitted on 9/15 with shortness of breath and diffuse chest pain. Admitted for acute on chronic respiratory failure. PMH: COPD on 2-3L O2 at home, asthma, CAD, MI, CABG, kidney transplant, DM, gout.    PT Comments  Continuing work on functional mobility and activity tolerance;  Session focused on progressive amb, with notable improved endurance, gait distance, and supplemental O2 needs; Much smoother and slower steps using a rollator RW (which pt is used to using) compared to pushing vitals machine; initiated walk on 3 L (like she does at home), and titrated to 4 L for majority of walk today; briefly had to incr to 6L due to sats dropping to 80-82% and not recovering with seated rest; once back in low 90s, decr to 4L and pt was able to walk back to her room; pt seemed encouraged by gait distance today   If plan is discharge home, recommend the following: A little help with bathing/dressing/bathroom;Assistance with cooking/housework   Can travel by private vehicle        Equipment Recommendations  None recommended by PT    Recommendations for Other Services       Precautions / Restrictions Precautions Precautions: Fall Precaution Comments: watch O2     Mobility  Bed Mobility Overal bed mobility: Needs Assistance Bed Mobility: Supine to Sit     Supine to sit: Supervision          Transfers Overall transfer level: Needs assistance Equipment used: Rollator (4 wheels) Transfers: Sit to/from Stand Sit to Stand: Supervision           General transfer comment: No overt difficulty    Ambulation/Gait Ambulation/Gait assistance: Contact guard assist, Supervision Gait Distance (Feet): 200 Feet (wtih 2 standing rest breaks and one seated rest break) Assistive device: Rollator (4 wheels) Gait  Pattern/deviations: Step-through pattern       General Gait Details: Much smoother and slower steps using a rollator RW (which pt is used to uing) compared to pushing vitals machine; initiated walk on 3 L (like she does at home), and titrated to 4 L for majority of walk today; briefly had to incr to 6L due to sats dropping to 80-82% and not recovering with seated rest; once back in low 90s, decr to 4L and pt was able to walk back to her room   Stairs             Wheelchair Mobility     Tilt Bed    Modified Rankin (Stroke Patients Only)       Balance     Sitting balance-Leahy Scale: Good       Standing balance-Leahy Scale: Fair Standing balance comment: can release walker in static standing                            Cognition Arousal: Alert Behavior During Therapy: WFL for tasks assessed/performed Overall Cognitive Status: Within Functional Limits for tasks assessed                                          Exercises      General Comments General comments (skin integrity, edema, etc.): O2 sats dropped to 80% (observed lowest), and  needed seated rest and incr to 4L, then 6L, to recover; ended session on 3L supplemental O2      Pertinent Vitals/Pain Pain Assessment Pain Assessment: Faces Faces Pain Scale: No hurt Pain Intervention(s): Monitored during session    Home Living                          Prior Function            PT Goals (current goals can now be found in the care plan section) Acute Rehab PT Goals Patient Stated Goal: HOme soon PT Goal Formulation: With patient Time For Goal Achievement: 08/20/23 Potential to Achieve Goals: Good Progress towards PT goals: Progressing toward goals    Frequency    Min 1X/week      PT Plan      Co-evaluation              AM-PAC PT "6 Clicks" Mobility   Outcome Measure  Help needed turning from your back to your side while in a flat bed without  using bedrails?: None Help needed moving from lying on your back to sitting on the side of a flat bed without using bedrails?: None Help needed moving to and from a bed to a chair (including a wheelchair)?: A Little Help needed standing up from a chair using your arms (e.g., wheelchair or bedside chair)?: A Little Help needed to walk in hospital room?: A Little Help needed climbing 3-5 steps with a railing? : A Lot 6 Click Score: 19    End of Session Equipment Utilized During Treatment: Oxygen;Gait belt Activity Tolerance: Patient tolerated treatment well Patient left: in bed;with call bell/phone within reach Nurse Communication: Mobility status PT Visit Diagnosis: Other abnormalities of gait and mobility (R26.89);Other (comment) (decr funcitonal capacity)     Time: 8413-2440 PT Time Calculation (min) (ACUTE ONLY): 33 min  Charges:    $Gait Training: 8-22 mins $Therapeutic Activity: 8-22 mins PT General Charges $$ ACUTE PT VISIT: 1 Visit                     Van Clines, PT  Acute Rehabilitation Services Office [phone] Secure Chat welcomed    [name] 08/07/2023, 5:08 PM

## 2023-08-07 NOTE — Plan of Care (Signed)

## 2023-08-07 NOTE — Consult Note (Signed)
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 Last Cr on 07/27/23 was 1.7 as an outpatient (followed by Dr. Arrie Aran). Nonoliguric -AKI cause is entirely unclear. Could be related to diuretics (which have been stopped), hypoxia in itself, supratherapeutic FK levels, or rejection. Will check UPC, UACR, FK levels, and transplant ultrasound -Avoid nephrotoxic medications including NSAIDs and iodinated intravenous contrast exposure unless the latter is absolutely indicated.  Preferred narcotic agents for pain control are hydromorphone, fentanyl, and methadone. Morphine should not be used. Avoid Baclofen and avoid oral sodium phosphate and magnesium citrate based laxatives / bowel preps. Continue strict Input and Output monitoring. Will monitor the patient closely with you and intervene or adjust therapy as indicated by changes in clinical status/labs   Immunosuppression Management: -maintenance IS: tacrolimus 4mg  bid, prednisone 5mg  daily. 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   Anthony Sar [facility] 08/07/2023 1:47 PM   _____________________________________________________________________________________   History of Present Illness: [name] is a/an 82 y.o. female with a past medical history of CKD status  post DCD KT 2012, COPD on chronic home O2, CAD status post CABG 2001, hypertension who presents to [facility] with shortness of breath.  Associated with some chest pain.  She has been started on prednisone and nebs per primary service.  Her chest x-ray in the ER did show some concerns of vascular congestion therefore she received Lasix 20 mg 2 days ago. Patient seen and examined bedside. She reports that her breathing is slightly getting better. She does report 2 instances of stool incontinence (couldn't make it to the bathroom) prior to admission. She does report some issues with tremors but that happens when she is SOB or coughing. Otherwise reports good compliance with meds, stays very well hydrated with water. Denies any chest pain currently, swelling, dysuria, hematuria, pain over allograft site.   Medications:  Current Facility-Administered Medications  Medication Dose Route Frequency Provider Last Rate Last Admin   acetaminophen (TYLENOL) tablet 650 mg  650 mg Oral Q8H PRN Katheran James, DO       amLODipine (NORVASC) tablet 10 mg  10 mg Oral Daily Katheran James, DO   10 mg at 08/07/23 0901   aspirin EC tablet 81 mg  81 mg Oral Daily Katheran James, DO   81 mg at 08/07/23 0901   azelastine (ASTELIN) 0.1 % nasal spray 1 spray  1 spray Each Nare Daily PRN Mercie Eon, MD       azithromycin (ZITHROMAX) tablet 500 mg  500 mg Oral Daily Rana Snare, DO   500 mg at 08/07/23 0901   calcitRIOL (ROCALTROL) capsule 0.25 mcg  0.25 mcg Oral Daily Katheran James, DO   0.25 mcg at 08/07/23 0902   enoxaparin (LOVENOX) injection 30 mg  30 mg Subcutaneous Q24H Rocky Morel, DO   30 mg at 08/06/23 1948   ezetimibe (ZETIA) tablet 10 mg  10 mg Oral Daily Katheran James,  DO   10 mg at 08/07/23 0901   fluticasone (FLONASE) 50 MCG/ACT nasal spray 1 spray  1 spray Each Nare Daily Katheran James, DO   1 spray at 08/07/23 0902   fluticasone furoate-vilanterol (BREO ELLIPTA) 200-25  MCG/ACT 1 puff  1 puff Inhalation Daily Katheran James, DO   1 puff at 08/07/23 0920   And   umeclidinium bromide (INCRUSE ELLIPTA) 62.5 MCG/ACT 1 puff  1 puff Inhalation Daily Katheran James, DO   1 puff at 08/07/23 0920   guaiFENesin (MUCINEX) 12 hr tablet 1,200 mg  1,200 mg Oral BID Lovie Macadamia, MD   1,200 mg at 08/07/23 0901   ipratropium-albuterol (DUONEB) 0.5-2.5 (3) MG/3ML nebulizer solution 3 mL  3 mL Nebulization Q4H PRN Katheran James, DO   3 mL at 08/06/23 0911   isosorbide mononitrate (IMDUR) 24 hr tablet 30 mg  30 mg Oral Daily Katheran James, DO   30 mg at 08/07/23 0901   metoprolol tartrate (LOPRESSOR) tablet 50 mg  50 mg Oral BID Katheran James, DO   50 mg at 08/07/23 0901   nitroGLYCERIN (NITROSTAT) SL tablet 0.4 mg  0.4 mg Sublingual Q5 min PRN Katheran James, DO       pantoprazole (PROTONIX) EC tablet 40 mg  40 mg Oral Daily Katheran James, DO   40 mg at 08/07/23 0901   predniSONE (DELTASONE) tablet 40 mg  40 mg Oral Q breakfast Rocky Morel, DO   40 mg at 08/07/23 0901   senna-docusate (Senokot-S) tablet 1 tablet  1 tablet Oral Daily PRN Katheran James, DO       tacrolimus (PROGRAF) capsule 4 mg  4 mg Oral Q12H Rana Snare, DO   4 mg at 08/07/23 0901     ALLERGIES Sodium hypochlorite, Lactose, Asa arthritis strength-antacid [aspirin buffered], Aspirin, Atorvastatin, Banana, Lactose intolerance (gi), and Lisinopril  MEDICAL HISTORY 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, hold ASA 2-3 weeks - consider plavix instead of ASA   Asthma    Cellulitis 12/19/2019   right upper arm   Coronary artery disease    cath 11/2007, grafts patent /  Nuclear, June, 2011, prior inferior MI with mild peri-infarct ischemia, anterior breast attenuation, EF 67%, done for renal transplant assessment / Low level exercise/Lexiscan Myoview (11/2013): EF 67%, no ischemi; normal study    Diabetes mellitus    type 2   ESRD on dialysis [facility])    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, 2012   Family history of breast cancer    Family history of prostate cancer    GERD (gastroesophageal reflux disease)    GI bleed 11/26/2010   January, 2012 , AVM   Gout    History of methicillin resistant staphylococcus aureus (MRSA)    Hyperlipidemia    Hyperparathyroidism    Hypertension    Myocardial infarction [facility])    Osteoporosis    Pneumonia 08/2010    Hospitalization, October, 2011   Primary osteoarthritis, 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   S/P kidney transplant    September, 2012, [facility]   Subacromial impingement of 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     SOCIAL HISTORY Social History   Socioeconomic History   Marital status: Divorced    Spouse name: Not on file   Number of children: Not on file   Years of education: Not on file   Highest education level: Not on file  Occupational History   Not on file  Tobacco Use   Smoking status: Former    Current packs/day: 0.00    Average packs/day: 1 pack/day for 8.0 years (8.0 ttl pk-yrs)    Types: Cigarettes    Start date: 11/21/1975    Quit date: 11/21/1983    Years since quitting: 39.7   Smokeless tobacco: Never  Vaping Use   Vaping status: Never Used  Substance and Sexual Activity   Alcohol use: No   Drug use: No   Sexual activity: Not on file  Other Topics Concern   Not on file  Social History Narrative   Not on file   Social Determinants of Health   Financial Resource Strain: Low Risk  (02/19/2023)   Received from [facility], [facility]   Overall Financial Resource Strain (CARDIA)    Difficulty of Paying Living Expenses: Not hard at all  Food Insecurity: No Food Insecurity (08/05/2023)   Hunger Vital Sign    Worried About Running Out of Food in the Last Year: Never true    Ran Out of Food in the Last Year: Never true  Transportation Needs: No Transportation Needs (08/05/2023)   PRAPARE - Administrator, Civil Service  (Medical): No    Lack of Transportation (Non-Medical): No  Physical Activity: Sufficiently Active (08/09/2022)   Received from [facility], [facility]   Exercise Vital Sign    Days of Exercise per Week: 2 days    Minutes of Exercise per Session: 120 min  Stress: No Stress Concern Present (08/09/2022)   Received [facility], [facility] - Occupational Stress Questionnaire    Feeling of Stress : Only a little  Social Connections: Socially Integrated (08/09/2022)   Received from [facility], [facility]   Social Network    How would you rate your social network (family, work, friends)?: Good participation with social networks  Intimate Partner Violence: Not At Risk (08/05/2023)   Humiliation, Afraid, Rape, and Kick questionnaire    Fear of Current or Ex-Partner: No    Emotionally Abused: No    Physically Abused: No    Sexually Abused: No     FAMILY HISTORY Family History  Problem Relation Age of Onset   Prostate cancer Brother    Lung cancer Brother    Cancer Neg Hx     Review of Systems: 12 systems reviewed Otherwise as per HPI, all other systems reviewed and negative  Physical Exam: Vitals:   08/07/23 0747 08/07/23 0920  BP: 133/68   Pulse: 82 91  Resp: 20 20  Temp: 98.2 F (36.8 C)   SpO2: 97% 93%   Total I/O In: -  Out: 700 [Urine:700]  Intake/Output Summary (Last 24 hours) at 08/07/2023 1347 Last data filed at 08/07/2023 1115 Gross per 24 hour  Intake 240 ml  Output 1500 ml  Net -1260 ml   General: well-appearing, no acute distress HEENT: anicteric sclera, oropharynx clear without lesions CV: regular rate, normal rhythm, no murmurs, no gallops, no rubs, no peripheral edema Lungs: poor air exchange bilaterally, normal wob,  in place Abd: soft, non-tender, non-distended Skin: no visible lesions or rashes Psych: alert, engaged, appropriate mood and affect Musculoskeletal: no obvious  deformities Neuro: normal speech, no gross focal deficits   Test Results Reviewed Lab Results  Component Value Date   NA 136 08/07/2023   K 5.2 (H) 08/07/2023   CL 104 08/07/2023   CO2 23 08/07/2023   BUN 48 (H) 08/07/2023  CREATININE 2.50 (H) 08/07/2023   GFR 30.27 (L) 07/10/2022   CALCIUM 9.2 08/07/2023   ALBUMIN 3.1 (L) 08/07/2023   PHOS 5.1 (H) 08/07/2023     I have reviewed all relevant outside healthcare records related to the patient's kidney injury.

## 2023-08-08 ENCOUNTER — Other Ambulatory Visit (HOSPITAL_COMMUNITY): Payer: Self-pay

## 2023-08-08 DIAGNOSIS — J9621 Acute and chronic respiratory failure with hypoxia: Secondary | ICD-10-CM | POA: Diagnosis not present

## 2023-08-08 DIAGNOSIS — N179 Acute kidney failure, unspecified: Secondary | ICD-10-CM | POA: Diagnosis not present

## 2023-08-08 DIAGNOSIS — Z940 Kidney transplant status: Secondary | ICD-10-CM | POA: Diagnosis not present

## 2023-08-08 DIAGNOSIS — J441 Chronic obstructive pulmonary disease with (acute) exacerbation: Secondary | ICD-10-CM | POA: Diagnosis not present

## 2023-08-08 LAB — GLUCOSE, CAPILLARY: Glucose-Capillary: 105 mg/dL — ABNORMAL HIGH (ref 70–99)

## 2023-08-08 MED ORDER — PREDNISONE 20 MG PO TABS
40.0000 mg | ORAL_TABLET | Freq: Every day | ORAL | 0 refills | Status: DC
  Filled 2023-08-08: qty 2, 1d supply, fill #0

## 2023-08-08 MED ORDER — GUAIFENESIN ER 600 MG PO TB12
ORAL_TABLET | ORAL | 0 refills | Status: AC
  Filled 2023-08-08: qty 30, 8d supply, fill #0

## 2023-08-08 NOTE — Plan of Care (Signed)
  Problem: Education: Goal: Knowledge of General Education information will improve Description: Including pain rating scale, medication(s)/side effects and non-pharmacologic comfort measures Outcome: Adequate for Discharge   Problem: Health Behavior/Discharge Planning: Goal: Ability to manage health-related needs will improve Outcome: Adequate for Discharge   Problem: Clinical Measurements: Goal: Ability to maintain clinical measurements within normal limits will improve Outcome: Adequate for Discharge Goal: Will remain free from infection Outcome: Adequate for Discharge Goal: Diagnostic test results will improve Outcome: Adequate for Discharge Goal: Respiratory complications will improve Outcome: Adequate for Discharge Goal: Cardiovascular complication will be avoided Outcome: Adequate for Discharge   Problem: Activity: Goal: Risk for activity intolerance will decrease Outcome: Adequate for Discharge   Problem: Nutrition: Goal: Adequate nutrition will be maintained Outcome: Adequate for Discharge   Problem: Coping: Goal: Level of anxiety will decrease Outcome: Adequate for Discharge   Problem: Elimination: Goal: Will not experience complications related to bowel motility Outcome: Adequate for Discharge Goal: Will not experience complications related to urinary retention Outcome: Adequate for Discharge   Problem: Pain Managment: Goal: General experience of comfort will improve Outcome: Adequate for Discharge   Problem: Safety: Goal: Ability to remain free from injury will improve Outcome: Adequate for Discharge   Problem: Skin Integrity: Goal: Risk for impaired skin integrity will decrease Outcome: Adequate for Discharge  Kynzlee Hucker Tamera Stands, RN

## 2023-08-08 NOTE — Discharge Instructions (Addendum)
Thank you for allowing [place] to be part of your care. You were hospitalized for a COPD Exacerbation.  Please call your primary doctor to arrange follow up. Please call your pulmonologist to make an appointment as well. Don't forget to talk about pulmonary rehab.  At discharge, you will receive two medicines: - You will go home with one day of an increased prednisone dose for your COPD - you will take 40mg  tomorrow. After tomorrow's dose, you will return to your regular dose of prednisone that you take for the kidneys. - You will go home with mucinex to continue to clear congestion. Take this according to the instructions on the bottle.  Ease back into your regular activities. Ask for help doing tasks as you see fit. It will take some time but you have shown all signs of improving well.

## 2023-08-08 NOTE — Progress Notes (Signed)
   08/08/23 1441  Mobility  Activity Ambulated with assistance in hallway  Level of Assistance Contact guard assist, steadying assist (SV Bed Mobility and STS)  Assistive Device Front wheel walker  Distance Ambulated (ft) 150 ft  Activity Response Tolerated fair  Mobility Referral Yes  $Mobility charge 1 Mobility  Mobility Specialist Start Time (ACUTE ONLY) 1345  Mobility Specialist Stop Time (ACUTE ONLY) 1419  Mobility Specialist Time Calculation (min) (ACUTE ONLY) 34 min   Mobility Specialist: Progress Note  Pt agreeable to mobility session - received in bed. Required CG using RW with c/o ongoing SOB. Pt wheezing and needing two (1-2 min) stand rest breaks. NT present throughout session. Pt returned to bed with all needs met - call bell within reach. RN in room.   Nurse requested Mobility Specialist to perform oxygen saturation test with pt which includes removing pt from oxygen both at rest and while ambulating.  Below are the results from that testing.     Patient Saturations on Room Air at Rest = spO2 88%  Patient Saturations on Room Air while Ambulating = sp02 - not attempted.  Patient Saturations on 4 Liters of oxygen while Ambulating = sp02 89%  At end of testing pt left in room on 4  Liters of oxygen, spO2 90%  Had discussion with nurse at two points during session regarding oxygen titration. Reported results to nurse.   Barnie Mort, BS Mobility Specialist Please contact via SecureChat or Rehab office at [phone].

## 2023-08-08 NOTE — Progress Notes (Signed)
Occupational Therapy Treatment Patient Details Name: [name] MRN: [id number] DOB: Aug 30, 1941 Today's Date: 08/08/2023   History of present illness Pt is an 82 year old woman admitted on 9/15 with shortness of breath and diffuse chest pain. Admitted for acute on chronic respiratory failure. PMH: COPD on 2-3L O2 at home, asthma, CAD, MI, CABG, kidney transplant, DM, gout.   OT comments  Pt stating, "This is the best I have felt since I got here." Only complaint was feeling cold. Pt using BSC and completing pericare and clothing management mod I. Donned front opening gown with set up. Ambulated to sink to wash hands and clean dentures with RW and supervision for safety and O2 line. Pt declined bathing and dressing due to being cold, returned to bed and provided warm blankets. Unable to record accurate O2 saturation due to pt's cold hands, on 4L O2 throughout session. Encouraged OOB to chair this morning.       If plan is discharge home, recommend the following:  Assistance with cooking/housework;Help with stairs or ramp for entrance;Assist for transportation   Equipment Recommendations  None recommended by OT    Recommendations for Other Services      Precautions / Restrictions Precautions Precautions: Fall Precaution Comments: watch O2 Restrictions Weight Bearing Restrictions: No       Mobility Bed Mobility Overal bed mobility: Modified Independent             General bed mobility comments: HOB flat, increased time and effort    Transfers Overall transfer level: Needs assistance Equipment used: Rolling walker (2 wheels) Transfers: Sit to/from Stand Sit to Stand: Modified independent (Device/Increase time)           General transfer comment: no AD for stand pivot to [facility], use of RW to walk to sink to wash hands     Balance Overall balance assessment: Needs assistance   Sitting balance-Leahy Scale: Good       Standing balance-Leahy Scale: Fair                              ADL either performed or assessed with clinical judgement   ADL Overall ADL's : Needs assistance/impaired     Grooming: Standing;Supervision/safety;Wash/dry hands           Upper Body Dressing : Set up;Sitting       Toilet Transfer: Modified Independent;Stand-pivot;BSC/3in1   Toileting- Clothing Manipulation and Hygiene: Modified independent;Sitting/lateral lean       Functional mobility during ADLs: Supervision/safety;Rolling walker (2 wheels)      Extremity/Trunk Assessment              Vision       Perception     Praxis      Cognition Arousal: Alert Behavior During Therapy: WFL for tasks assessed/performed Overall Cognitive Status: Within Functional Limits for tasks assessed                                          Exercises      Shoulder Instructions       General Comments      Pertinent Vitals/ Pain       Pain Assessment Pain Assessment: No/denies pain  Home Living  Prior Functioning/Environment              Frequency  Min 1X/week        Progress Toward Goals  OT Goals(current goals can now be found in the care plan section)  Progress towards OT goals: Progressing toward goals  Acute Rehab OT Goals OT Goal Formulation: With patient Time For Goal Achievement: 08/27/23 Potential to Achieve Goals: Good  Plan      Co-evaluation                 AM-PAC OT "6 Clicks" Daily Activity     Outcome Measure   Help from another person eating meals?: None Help from another person taking care of personal grooming?: A Little Help from another person toileting, which includes using toliet, bedpan, or urinal?: None Help from another person bathing (including washing, rinsing, drying)?: A Little Help from another person to put on and taking off regular upper body clothing?: None Help from another person to put on and taking  off regular lower body clothing?: A Little 6 Click Score: 21    End of Session Equipment Utilized During Treatment: Gait belt;Rolling walker (2 wheels);Oxygen (4L)  OT Visit Diagnosis: Unsteadiness on feet (R26.81);Other abnormalities of gait and mobility (R26.89);Muscle weakness (generalized) (M62.81);Other (comment) (decreased activity tolerance)   Activity Tolerance Patient tolerated treatment well   Patient Left in bed;with call bell/phone within reach   Nurse Communication          Time: 7829-5621 OT Time Calculation (min): 15 min  Charges: OT General Charges $OT Visit: 1 Visit OT Treatments $Self Care/Home Management : 8-22 mins  Berna Spare, OTR/L Acute Rehabilitation Services Office: [phone]   Evern Bio 08/08/2023, 9:17 AM

## 2023-08-08 NOTE — Plan of Care (Signed)

## 2023-08-08 NOTE — Progress Notes (Signed)
[facility] Progress Note    Assessment/ Plan:   AKI on CKD3 DDKT 2012 -Baseline cr around 1.1-1.3 but seems that her Cr has been widely fluctuant lately (etiology had been unclear). Last Cr on 07/27/23 was 1.7 as an outpatient (followed by Dr. Arrie Aran). Nonoliguric. Urine sediment bland. Txp u/s with some borderline/mildly elevated Ris but otherwise unremarkable. UPC, UACR pending -she has been having these fluctuations with her Cr and etiology is entirely unclear. It is possible that her rise in Cr was a sequelae of the diuretics she received a few days ago. I am suspecting she may end up needing a transplant biopsy. She has an appt with her txp team @ [facility] on 9/30 and will be having labs then -Cr down to 1.65 (around her baseline) today. -Avoid nephrotoxic medications including NSAIDs and iodinated intravenous contrast exposure unless the latter is absolutely indicated.  Preferred narcotic agents for pain control are hydromorphone, fentanyl, and methadone. Morphine should not be used. Avoid Baclofen and avoid oral sodium phosphate and magnesium citrate based laxatives / bowel preps. Continue strict Input and Output monitoring. Will monitor the patient closely with you and intervene or adjust therapy as indicated by changes in clinical status/labs    Immunosuppression Management: -maintenance IS: tacrolimus 4mg  bid, prednisone 5mg  daily. 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 Has follow up with [facility] on 08/20/23 therefore will have labs checked then.  Anthony Sar, MD [facility]  Subjective:   Pt seen and examined bedside. Niece and caregiver at bedside. No acute events. No complaints.   Objective:   BP (!) 144/66 (BP Location: Right Arm)   Pulse 78   Temp 98 F (36.7 C) (Oral)   Resp 17   Ht 5\' 2"  (1.575 m)   Wt 63.3 kg   LMP 03/04/2022   SpO2 98%   BMI 25.52 kg/m   Intake/Output Summary (Last 24 hours) at 08/08/2023 1214 Last data filed at 08/08/2023 0957 Gross per 24 hour  Intake 970 ml  Output 600 ml  Net 370 ml   Weight change: -2.1 kg  Physical Exam: Gen: NAD CVS: RRR Resp: poor air exchange bilaterally, unlabored Abd: soft, nt/nd Ext: no edema Neuro: awake, alert  Imaging: US Renal Transplant w/Doppler  Result Date: 08/07/2023 CLINICAL DATA:  Acute renal insufficiency EXAM: ULTRASOUND OF RENAL TRANSPLANT WITH RENAL DOPPLER ULTRASOUND TECHNIQUE: Ultrasound examination of the renal transplant was performed with gray-scale, color and duplex doppler evaluation. COMPARISON:  None Available. FINDINGS: Transplant kidney location: RLQ Transplant Kidney: Renal measurements: 9.6 x 5.0 x 5.7 cm = volume: 142.42mL. Normal in size and parenchymal echogenicity. No evidence of hydronephrosis. 2.3 cm simple cyst does not require imaging follow-up. No peri-transplant fluid collection seen. Color flow in the main renal artery:  Yes Color flow in the main renal vein:  Yes Duplex Doppler Evaluation: Main Renal Artery Velocity: 126 cm/sec Main Renal Artery Resistive Index: 0.82 Venous waveform in main renal vein:  Present Intrarenal resistive index in  upper pole:  0.98 (normal 0.6-0.8; equivocal 0.8-0.9; abnormal >= 0.9) Intrarenal resistive index in lower pole: 0.85 (normal 0.6-0.8; equivocal 0.8-0.9; abnormal >= 0.9) Bladder: Normal for degree of bladder distention. Other findings:   None. IMPRESSION: 1. Borderline to mildly elevated intrarenal resistive indices as above within the transplant kidney. Otherwise unremarkable exam. Electronically Signed   By: Sharlet Salina M.D.   On: 08/07/2023 15:28    Labs: BMET Recent Labs  Lab 08/05/23 1141 08/05/23 1147 08/06/23 0341 08/07/23 0254 08/08/23 0630  NA 136 138 137 136 138  K 4.0 4.2 4.6 5.2* 4.6  CL 105  --  105 104 108  CO2 25  --  20* 23 23  GLUCOSE 133*  --  234* 125* 105*  BUN 23  --  32* 48* 36*  CREATININE 1.68*  --  1.97* 2.50* 1.65*  CALCIUM 9.0  --  9.0 9.2 8.6*  PHOS  --   --  4.6 5.1* 3.9   CBC Recent Labs  Lab 08/05/23 1141 08/05/23 1147 08/06/23 0341 08/07/23 0254  WBC 9.2  --  6.3 12.3*  NEUTROABS 6.0  --  5.7  --   HGB 12.1 12.6 11.8* 11.5*  HCT 37.2 37.0 34.4* 35.1*  MCV 71.4*  --  71.8* 71.6*  PLT 143*  --  138* 160    Medications:     amLODipine  10 mg Oral Daily   aspirin EC  81 mg Oral Daily   calcitRIOL  0.25 mcg Oral Daily   enoxaparin (LOVENOX) injection  30 mg Subcutaneous Q24H   ezetimibe  10 mg Oral Daily   fluticasone  1 spray Each Nare Daily   fluticasone furoate-vilanterol  1 puff Inhalation Daily   And   umeclidinium bromide  1 puff Inhalation Daily   guaiFENesin  1,200 mg Oral BID   isosorbide mononitrate  30 mg Oral Daily   metoprolol tartrate  50 mg Oral BID   pantoprazole  40 mg Oral Daily   predniSONE  40 mg Oral Q breakfast   tacrolimus  4 mg Oral Q12H      Anthony Sar, MD [facility] 08/08/2023, 12:14 PM

## 2023-08-08 NOTE — Discharge Summary (Signed)
Name: [name] MRN: [id number] DOB: 09/14/1941 82 y.o. PCP: [name], MD  Date of Admission: 08/05/2023 11:27 AM Date of Discharge:  08/08/23 Attending Physician: Dr.  Lafonda Mosses  DISCHARGE DIAGNOSIS:  Primary Problem: Acute on chronic hypoxic respiratory failure [facility])   Hospital Problems: Principal Problem:   Acute on chronic hypoxic respiratory failure (HCC) Active Problems:   COPD exacerbation (HCC)   AKI (acute kidney injury) (HCC)   Kidney transplant recipient   Transplanted kidney    DISCHARGE MEDICATIONS:   Allergies as of 08/08/2023       Reactions   Sodium Hypochlorite Shortness Of Breath   Lactose Nausea And Vomiting   Asa Arthritis Strength-antacid [aspirin Buffered] Nausea And Vomiting, Other (See Comments)   STOMACH BURNS, also   Aspirin Nausea And Vomiting   Per pt. "can tolerate the enteric coated tablets".    Atorvastatin Other (See Comments)   Patient's skin was skin was sensitive   Banana Nausea And Vomiting   Lactose Intolerance (gi) Nausea And Vomiting   Lisinopril Cough        Medication List     TAKE these medications    acetaminophen 650 MG CR tablet Commonly known as: TYLENOL Take 650 mg by mouth every morning.   albuterol (2.5 MG/3ML) 0.083% nebulizer solution Commonly known as: PROVENTIL Take 2.5 mg by nebulization every 6 (six) hours as needed for wheezing or shortness of breath.   albuterol 108 (90 Base) MCG/ACT inhaler Commonly known as: VENTOLIN HFA Inhale 1-2 puffs into the lungs every 6 (six) hours as needed for wheezing or shortness of breath.   amLODipine 10 MG tablet Commonly known as: NORVASC Take 10 mg by mouth daily.   aspirin EC 81 MG tablet Take 81 mg by mouth daily.   Azelastine HCl 0.15 % Soln Place 1 spray into both nostrils daily as needed for allergies.   budesonide 0.25 MG/2ML nebulizer solution Commonly known as: PULMICORT Inhale 0.25 mg into the lungs as needed.   calcitRIOL 0.25 MCG  capsule Commonly known as: ROCALTROL Take 0.25 mcg by mouth daily.   ezetimibe 10 MG tablet Commonly known as: ZETIA TAKE 1 TABLET BY MOUTH EVERY DAY   fluticasone 50 MCG/ACT nasal spray Commonly known as: FLONASE Place into both nostrils.   fluticasone furoate-vilanterol 200-25 MCG/INH Aepb Commonly known as: BREO ELLIPTA Inhale 1 puff into the lungs daily as needed (respiratory issues.).   guaiFENesin 600 MG 12 hr tablet Commonly known as: MUCINEX Take 1 tablet (600 mg total) by mouth 3 (three) times daily. Do this for 3 days. Afterwards, take 1 tablet every 6 hours as needed for chest congestion. Do not exceed 2400mg  in one day.   isosorbide mononitrate 30 MG 24 hr tablet Commonly known as: IMDUR Take 1 tablet (30 mg total) by mouth daily. Please keep schedule appointment for future refills. Thank you.   Klor-Con M20 20 MEQ tablet Generic drug: potassium chloride SA TAKE 1 TABLET BY MOUTH EVERY DAY   metoprolol tartrate 50 MG tablet Commonly known as: LOPRESSOR TAKE 1 TABLET BY MOUTH TWICE A DAY   nitroGLYCERIN 0.4 MG SL tablet Commonly known as: NITROSTAT Place 1 tablet (0.4 mg total) under the tongue every 5 (five) minutes as needed for chest pain.   omeprazole 20 MG capsule Commonly known as: PRILOSEC Take 20 mg by mouth daily after breakfast.   OneTouch Verio test strip Generic drug: glucose blood 1 EACH BY OTHER ROUTE DAILY IN THE AFTERNOON. USE AS  INSTRUCTED   predniSONE 5 MG tablet Commonly known as: DELTASONE Take 5 mg by mouth 2 (two) times daily. What changed: Another medication with the same name was added. Make sure you understand how and when to take each.   predniSONE 20 MG tablet Commonly known as: DELTASONE Take 2 tablets (40 mg total) by mouth daily with breakfast. Start taking on: August 09, 2023 What changed: You were already taking a medication with the same name, and this prescription was added. Make sure you understand how and when to  take each.   sennosides-docusate sodium 8.6-50 MG tablet Commonly known as: SENOKOT-S Take 1 tablet by mouth daily as needed (constipation.).   tacrolimus 1 MG capsule Commonly known as: PROGRAF Take 5 mg by mouth See admin instructions. Take 5 mg by mouth at 9 AM and 5 mg at 9 PM   Tiotropium Bromide Monohydrate 1.25 MCG/ACT Aers Inhale 1 puff into the lungs daily.   Trelegy Ellipta 200-62.5-25 MCG/ACT Aepb Generic drug: Fluticasone-Umeclidin-Vilant Inhale 1 puff into the lungs daily.        DISPOSITION AND FOLLOW-UP:  [name]. At the hospital follow up visit please address:  Follow-up Recommendations: Labs: Basic Metabolic Profile to assess kidney function  At follow up, please consider the following: Assess respiratory and functional status. She has been treated for acute COPD exacerbation with return to her pre-hospital baseline. Unfortunately, this baseline is substantial dyspnea on exertion. She will have home health services. Please ensure that she remains adherent with her home inhalers. We discussed the possibility of pulmonary rehab, which she will discuss with her pulmonologist. Please also ensure she remains on her kidney transplant medicines and will see her transplant team at the end of September. She was seen by nephrology here and concern was expressed that she may need a donor kidney biopsy.  Follow-up Appointments:  Follow-up Information     [facility].   Specialty: Home Health Services Contact information: [address] [place] [address] [phone]                 HOSPITAL COURSE:  Patient Summary: Acute on Chronic Hypoxic Respiratory Failure COPD Exacerbation At admission, she required oxygen past baseline of 2-4L. No clear infectious source on viral panel and CXR and no antibiotics were used. She has used her inhalers. She  believes the change in weather set her off. She responded well with treatment via steroids, scheduled duo nebs, azithromycin for inflammation, ICS/LAMA/LABA inhaler, and mucolytics. Inititial BNP elevation, LE edema, and CXR with vascular congestion arose concern for heart failure component and she received brief diuresis with 20 IV lasix, but that was not continued in setting of improvement and caution regarding her donor kidney. On the third morning of her stay she reported immense improvement and confidence with discharge home. - Home on one more dose prednisone 40 to complete 5 day course - For pulmonary rehab, she will establish through her pulmonologist Dr. Melodye Ped   AKI on ESRD s/p [facility] Transplant in Sep 2012 Creatinine closely monitored with AKI of 2.5 up from 1.6 on admission, her approximate baseline (though it appears to fluctuate). She reported reduced po intake relative to home habits and received 1/2L of IV fluids with resolutoin the next day. We did speak to nephrology, whose assessment expressed concern for the need of donor kidney biopsy given fluctuations in Cr but that she was stable  for discharge given the patient's continued OP follow-up for her kidney. - She will return to her regular 5mg  twice daily prednisone after she completes her COPD prednisone course - She will continue Tacrolimus 4 mg twice daily - continue Calcitriol 0.25 mcg daily - She remains in touch with [facility] and an appointment on 08/20/23   Chronic and Inactive Issues CAD/previous MI with CABG in 2001 Complained of chest pain at admission, but non-cardiac and likely related to her respiration. Importantly, troponins of 22 and 22 at admission. Most recent ECHO in 04/2023 showed EF 55-60% and mild concentric LV hypertrophy and indeterminate diastolic parameters, mild-moderate RA/LA dilation, stable. Home medicines continued: - Aspirin 81 mg daily - Ezetimibe 10 mg daily - Rosuvastatin 40 mg daily -  Nitroglycerin 0.4 mg as needed - Imdur 30 mg daily   GERD - continued Omeprazole 20 mg daily   Constipation - continued Senokot 8.6-50 mg as needed    HTN - continued Amlodipine 10 mg daily - continued Lopressor 50 mg twice a day - held Potassium chloride 20 mEq daily. Patient to resume at discharge   DISCHARGE INSTRUCTIONS:   Discharge Instructions     Call MD for:  difficulty breathing, headache or visual disturbances   Complete by: As directed    Call MD for:  persistant dizziness or light-headedness   Complete by: As directed    Call MD for:  persistant nausea and vomiting   Complete by: As directed    Call MD for:  severe uncontrolled pain   Complete by: As directed    Call MD for:  temperature >100.4   Complete by: As directed    Diet Carb Modified   Complete by: As directed    Increase activity slowly   Complete by: As directed        SUBJECTIVE:  Pt feeling very well. She feels ready to go home. She feels better than she has in a week. She reports continued dyspnea with exertion but it is her baseline that she knows how to manage. She has close follow-up with her primary, pulm, and transplant doctors. Discharge Vitals:   BP (!) 144/66 (BP Location: Right Arm)   Pulse 78   Temp 98 F (36.7 C) (Oral)   Resp 17   Ht 5\' 2"  (1.575 m)   Wt 63.3 kg   LMP 03/04/2022   SpO2 98%   BMI 25.52 kg/m   OBJECTIVE:  Physical Exam   Pertinent Labs, Studies, and Procedures:     Latest Ref Rng & Units 08/07/2023    2:54 AM 08/06/2023    3:41 AM 08/05/2023   11:47 AM  CBC  WBC 4.0 - 10.5 K/uL 12.3  6.3    Hemoglobin 12.0 - 15.0 g/dL 36.6  44.0  34.7   Hematocrit 36.0 - 46.0 % 35.1  34.4  37.0   Platelets 150 - 400 K/uL 160  138         Latest Ref Rng & Units 08/08/2023    6:30 AM 08/07/2023    2:54 AM 08/06/2023    3:41 AM  CMP  Glucose 70 - 99 mg/dL 425  956  387   BUN 8 - 23 mg/dL 36  48  32   Creatinine 0.44 - 1.00 mg/dL 5.64  3.32  9.51   Sodium 135 - 145  mmol/L 138  136  137   Potassium 3.5 - 5.1 mmol/L 4.6  5.2  4.6   Chloride 98 - 111  mmol/L 108  104  105   CO2 22 - 32 mmol/L 23  23  20    Calcium 8.9 - 10.3 mg/dL 8.6  9.2  9.0     DG Chest Port 1 View  Result Date: 08/05/2023 CLINICAL DATA:  Shortness of breath with cough over the past 3 days. EXAM: PORTABLE CHEST 1 VIEW COMPARISON:  12/18/2019 FINDINGS: Sternotomy wires unchanged. Lungs are adequately inflated with mild hazy prominence of the central pulmonary vessels suggesting mild vascular congestion. No focal lobar consolidation or effusion. Borderline cardiomegaly. Remainder of the exam is unchanged. IMPRESSION: Borderline cardiomegaly with findings suggesting mild vascular congestion. Electronically Signed   By: Elberta Fortis M.D.   On: 08/05/2023 11:58    Signed: Katheran James, DO Internal Medicine Resident, PGY-1 [facility]  Pager: [phone] 2:56 PM, 08/08/2023

## 2023-08-09 ENCOUNTER — Ambulatory Visit: Payer: 59 | Admitting: Internal Medicine

## 2023-08-09 NOTE — Progress Notes (Deleted)
Name: [name]  Age/ Sex: 82 y.o., female   MRN/ DOB: [id number], 03/29/1941     PCP: [name], MD   Reason for Endocrinology Evaluation: Type 2 Diabetes Mellitus  Initial Endocrine Consultative Visit: 02/28/2013    PATIENT IDENTIFIER: Ms. [name] is a 82 y.o. female with a past medical history of T2DM, CAD, Hx of ESRD ( S/P transplant 2013) . The patient has followed with Endocrinology clinic since 02/28/2013 for consultative assistance with management of her diabetes.  DIABETIC HISTORY:  Ms. Cheuvront was diagnosed with DM 2013.  She was on oral glycemic agents such as glipizide (discontinued in 2019) Her hemoglobin A1c has ranged from 5.5% in 2014, peaking at 6.5% in 2022.  She was on Glipizide but discontinued in 2019  She was followed by Dr. Everardo All from 2014 until 02/2022    SUBJECTIVE:   During the last visit (01/01/2023): A1c 6.0%  Today (08/09/2023): [name] is here for a follow up on diabetes management .  She checks her blood sugars occasionally.  Patient had an ED visit for acute on chronic hypoxic respiratory failure due to COPD exacerbation She follows  with pulmonary  Patient on Breo and tiotropium, oxygen 2 L at rest She had a follow-up with renal transplant team through [facility] with nephrology Dr. 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, hold ASA 2-3 weeks - consider plavix instead of ASA   Asthma     Cellulitis 12/19/2019   right upper arm   Coronary artery disease    cath 11/2007, grafts patent /  Nuclear, June, 2011, prior inferior MI with mild peri-infarct ischemia, anterior breast attenuation, EF 67%, done for renal transplant assessment / Low level exercise/Lexiscan Myoview (11/2013): EF 67%, no ischemi; normal study   Diabetes mellitus    type 2   ESRD on dialysis [facility])    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, 2012   Family history of breast cancer    Family history of prostate cancer    GERD (gastroesophageal reflux disease)    GI bleed 11/26/2010   January, 2012 , AVM   Gout    History of methicillin resistant staphylococcus aureus (MRSA)    Hyperlipidemia    Hyperparathyroidism    Hypertension    Myocardial infarction [facility])    Osteoporosis    Pneumonia 08/2010    Hospitalization, October, 2011   Primary osteoarthritis, 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   S/P kidney transplant    September, 2012, [facility]   Subacromial impingement of 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   Past Surgical History:  Past Surgical History:  Procedure Laterality Date   ABDOMINAL HYSTERECTOMY  1980'S   ARTERIOVENOUS GRAFT PLACEMENT Left 03/04/2007   forearm   BIOPSY  09/30/2019   Procedure: BIOPSY;  Surgeon: Charlott Rakes, MD;  Location: [facility];  Service: Endoscopy;;   BIOPSY  12/27/2021   Procedure: BIOPSY;  Surgeon: Charlott Rakes, MD;  Location: [facility];  Service: Endoscopy;;  BREAST EXCISIONAL BIOPSY Right    benign more than 10 yr ago   BREAST SURGERY  YRS AGO   RT BREAST CYST REMOVED    CARDIAC CATHETERIZATION  06/03/2002; 12/04/2007   COLONOSCOPY WITH PROPOFOL N/A 07/14/2014   Procedure: COLONOSCOPY WITH PROPOFOL;  Surgeon: Shirley Friar, MD;  Location: [facility];  Service: Endoscopy;  Laterality: N/A;   COLONOSCOPY WITH PROPOFOL N/A 09/30/2019   Procedure: COLONOSCOPY WITH PROPOFOL;  Surgeon: Charlott Rakes, MD;  Location: [facility];  Service: Endoscopy;  Laterality: N/A;   COLONOSCOPY  WITH PROPOFOL N/A 12/27/2021   Procedure: COLONOSCOPY WITH PROPOFOL;  Surgeon: Charlott Rakes, MD;  Location: [facility];  Service: Endoscopy;  Laterality: N/A;   CORONARY ARTERY BYPASS GRAFT  06/06/2002   x 4   ESOPHAGOGASTRODUODENOSCOPY N/A 12/27/2021   Procedure: ESOPHAGOGASTRODUODENOSCOPY (EGD);  Surgeon: Charlott Rakes, MD;  Location: [facility];  Service: Endoscopy;  Laterality: N/A;   HEMOSTASIS CLIP PLACEMENT  12/27/2021   Procedure: HEMOSTASIS CLIP PLACEMENT;  Surgeon: Charlott Rakes, MD;  Location: [facility];  Service: Endoscopy;;   HOT HEMOSTASIS N/A 07/14/2014   Procedure: HOT HEMOSTASIS (ARGON PLASMA COAGULATION/BICAP);  Surgeon: Shirley Friar, MD;  Location: [facility];  Service: Endoscopy;  Laterality: N/A;   IR FLUORO GUIDE CV LINE RIGHT  12/22/2019   IR US GUIDE VASC ACCESS RIGHT  12/22/2019   KIDNEY TRANSPLANT Right 08/04/2011   ORIF PATELLA Left 04/06/2016   Procedure: OPEN REDUCTION INTERNAL (ORIF) LEFT  PATELLA;  Surgeon: Loreta Ave, MD;  Location: [facility];  Service: Orthopedics;  Laterality: Left;   POLYPECTOMY  09/30/2019   Procedure: POLYPECTOMY;  Surgeon: Charlott Rakes, MD;  Location: [facility];  Service: Endoscopy;;   POLYPECTOMY  12/27/2021   Procedure: POLYPECTOMY;  Surgeon: Charlott Rakes, MD;  Location: [facility];  Service: Endoscopy;;   SHOULDER ARTHROSCOPY WITH ROTATOR CUFF REPAIR AND SUBACROMIAL DECOMPRESSION Left 08/03/2016   Procedure: LEFT SHOULDER ARTHROSCOPY WITH ROTATOR CUFF REPAIR AND SUBACROMIAL DECOMPRESSION with distal claviculectomy and extentsive debridement;  Surgeon: Loreta Ave, MD;  Location:  SURGERY CENTER;  Service: Orthopedics;  Laterality: Left;  Block   THROMBECTOMY AND REVISION OF ARTERIOVENTOUS (AV) GORETEX  GRAFT Left 05/08/2007   forearm   TOTAL HIP ARTHROPLASTY  12/18/2012   Procedure: TOTAL HIP ARTHROPLASTY;  Surgeon: Loreta Ave, MD;  Location[facility] OR;  Service:  Orthopedics;  Laterality: Left;   VESICOVAGINAL FISTULA CLOSURE W/ TAH  1984   Social History:  reports that she quit smoking about 39 years ago. Her smoking use included cigarettes. She started smoking about 47 years ago. She has a 8 pack-year smoking history. She has never used smokeless tobacco. She reports that she does not drink alcohol and does not use drugs. Family History:  Family History  Problem Relation Age of Onset   Prostate cancer Brother    Lung cancer Brother    Cancer Neg Hx      HOME MEDICATIONS: Allergies as of 08/09/2023       Reactions   Sodium Hypochlorite Shortness Of Breath   Lactose Nausea And Vomiting   Asa Arthritis Strength-antacid [aspirin Buffered] Nausea And Vomiting, Other (See Comments)   STOMACH BURNS, also   Aspirin Nausea And Vomiting   Per pt. "can tolerate the enteric coated tablets".    Atorvastatin Other (See Comments)   Patient's skin was skin was sensitive   Banana Nausea And Vomiting   Lactose Intolerance (gi) Nausea And Vomiting   Lisinopril Cough  Medication List        Accurate as of August 09, 2023  7:29 AM. If you have any questions, ask your nurse or doctor.          acetaminophen 650 MG CR tablet Commonly known as: TYLENOL Take 650 mg by mouth every morning.   albuterol (2.5 MG/3ML) 0.083% nebulizer solution Commonly known as: PROVENTIL Take 2.5 mg by nebulization every 6 (six) hours as needed for wheezing or shortness of breath.   albuterol 108 (90 Base) MCG/ACT inhaler Commonly known as: VENTOLIN HFA Inhale 1-2 puffs into the lungs every 6 (six) hours as needed for wheezing or shortness of breath.   amLODipine 10 MG tablet Commonly known as: NORVASC Take 10 mg by mouth daily.   aspirin EC 81 MG tablet Take 81 mg by mouth daily.   Azelastine HCl 0.15 % Soln Place 1 spray into both nostrils daily as needed for allergies.   budesonide 0.25 MG/2ML nebulizer solution Commonly known as:  PULMICORT Inhale 0.25 mg into the lungs as needed.   calcitRIOL 0.25 MCG capsule Commonly known as: ROCALTROL Take 0.25 mcg by mouth daily.   ezetimibe 10 MG tablet Commonly known as: ZETIA TAKE 1 TABLET BY MOUTH EVERY DAY   fluticasone 50 MCG/ACT nasal spray Commonly known as: FLONASE Place into both nostrils.   fluticasone furoate-vilanterol 200-25 MCG/INH Aepb Commonly known as: BREO ELLIPTA Inhale 1 puff into the lungs daily as needed (respiratory issues.).   guaiFENesin 600 MG 12 hr tablet Commonly known as: MUCINEX Take 2 tablets (1,200 mg total) by mouth every 12 (twelve) hours for 3 days, THEN 2 tablets (1,200 mg total) every 12 (twelve) hours as needed thereafter Start taking on: August 08, 2023   isosorbide mononitrate 30 MG 24 hr tablet Commonly known as: IMDUR Take 1 tablet (30 mg total) by mouth daily. Please keep schedule appointment for future refills. Thank you.   Klor-Con M20 20 MEQ tablet Generic drug: potassium chloride SA TAKE 1 TABLET BY MOUTH EVERY DAY   metoprolol tartrate 50 MG tablet Commonly known as: LOPRESSOR TAKE 1 TABLET BY MOUTH TWICE A DAY   nitroGLYCERIN 0.4 MG SL tablet Commonly known as: NITROSTAT Place 1 tablet (0.4 mg total) under the tongue every 5 (five) minutes as needed for chest pain.   omeprazole 20 MG capsule Commonly known as: PRILOSEC Take 20 mg by mouth daily after breakfast.   OneTouch Verio test strip Generic drug: glucose blood 1 EACH BY OTHER ROUTE DAILY IN THE AFTERNOON. USE AS INSTRUCTED   predniSONE 5 MG tablet Commonly known as: DELTASONE Take 5 mg by mouth 2 (two) times daily.   predniSONE 20 MG tablet Commonly known as: DELTASONE Take 2 tablets (40 mg total) by mouth daily with breakfast.   sennosides-docusate sodium 8.6-50 MG tablet Commonly known as: SENOKOT-S Take 1 tablet by mouth daily as needed (constipation.).   tacrolimus 1 MG capsule Commonly known as: PROGRAF Take 5 mg by mouth See  admin instructions. Take 5 mg by mouth at 9 AM and 5 mg at 9 PM   Tiotropium Bromide Monohydrate 1.25 MCG/ACT Aers Inhale 1 puff into the lungs daily.   Trelegy Ellipta 200-62.5-25 MCG/ACT Aepb Generic drug: Fluticasone-Umeclidin-Vilant Inhale 1 puff into the lungs daily.         OBJECTIVE:   Vital Signs: LMP 03/04/2022   Wt Readings from Last 3 Encounters:  08/08/23 139 lb 8.8 oz (63.3 kg)  04/09/23 139 lb 9.6 oz (63.3 kg)  01/01/23 137 lb 12.8 oz (62.5 kg)     Exam: General: Pt appears well and is in NAD  Lungs: Clear with good BS bilat   Heart: RRR   Extremities: No pretibial edema.   Neuro: MS is good with appropriate affect, pt is alert and Ox3    DM foot exam: 07/10/2022  The skin of the feet is intact without sores or ulcerations. The pedal pulses are 1+ on right and 2\1+ on left. The sensation is intact to a screening 5.07, 10 gram monofilament bilaterally          DATA REVIEWED:  Lab Results  Component Value Date   HGBA1C 6.0 (A) 01/01/2023   HGBA1C 6.4 (A) 07/10/2022   HGBA1C 6.1 (A) 02/22/2022    Latest Reference Range & Units 08/08/23 06:30  Sodium 135 - 145 mmol/L 138  Potassium 3.5 - 5.1 mmol/L 4.6  Chloride 98 - 111 mmol/L 108  CO2 22 - 32 mmol/L 23  Glucose 70 - 99 mg/dL 409 (H)  BUN 8 - 23 mg/dL 36 (H)  Creatinine 8.11 - 1.00 mg/dL 9.14 (H)  Calcium 8.9 - 10.3 mg/dL 8.6 (L)  Anion gap 5 - 15  7  Phosphorus 2.5 - 4.6 mg/dL 3.9  Albumin 3.5 - 5.0 g/dL 3.0 (L)  GFR, Estimated >60 mL/min 31 (L)  (H): Data is abnormally high (L): Data is abnormally low  ASSESSMENT / PLAN / RECOMMENDATIONS:   1) Type 2 Diabetes Mellitus, Optimally controlled, With CKD III (S/P renal transplant) and macrovascular  complications - Most recent A1c of 6.0 %. Goal A1c < 7.0 %.     -Her A1c is acceptable but I suspect that it is skewed due to CKD -She was  on glipizide which was discontinued in 2019 - NO change    MEDICATIONS: N/a  EDUCATION /  INSTRUCTIONS: BG monitoring instructions: Patient is instructed to check her blood sugars 2-3 times a week.   2) Diabetic complications:  Eye: Does not have known diabetic retinopathy.  Neuro/ Feet: Does not have known diabetic peripheral neuropathy .  Renal: Patient does  have known baseline CKD. She   is not on an ACEI/ARB at present.  She is s/p P renal transplant, follows with nephrology     F/U in 6 months     Signed electronically by: Lyndle Herrlich, MD  [facility] Endocrinology  [facility] [address] [place], [place] [address] Phone: [phone] FAX: [phone]   CC: [name], MD [address] [place] [address] Phone: [phone]  Fax: [phone]  Return to Endocrinology clinic as below: Future Appointments  Date Time Provider Department Center  08/09/2023 11:10 AM Henley Blyth, Konrad Dolores, MD [id number] None  10/08/2023 10:20 AM Nahser, Deloris Ping, MD [id number]

## 2023-08-10 LAB — TACROLIMUS LEVEL: Tacrolimus (FK506) - LabCorp: 4 ng/mL (ref 2.0–20.0)

## 2023-08-14 ENCOUNTER — Other Ambulatory Visit (HOSPITAL_COMMUNITY): Payer: Self-pay

## 2023-08-15 ENCOUNTER — Other Ambulatory Visit (HOSPITAL_COMMUNITY): Payer: Self-pay

## 2023-09-19 ENCOUNTER — Other Ambulatory Visit: Payer: Self-pay | Admitting: Physician Assistant

## 2023-09-27 ENCOUNTER — Other Ambulatory Visit: Payer: Self-pay | Admitting: Cardiovascular Disease

## 2023-10-07 ENCOUNTER — Encounter: Payer: Self-pay | Admitting: Cardiovascular Disease

## 2023-10-07 NOTE — Progress Notes (Unsigned)
Cardiology Office Note   Date:  10/08/2023   ID:  [name], DOB Oct 16, 1941, MRN [id number]  PCP:  Avel Peace Chales Salmon, MD  Cardiologist:  [name], MD   Chief Complaint  Patient presents with   Coronary Artery Disease   Hyperlipidemia   Problem list 1. CAD - CABG , 2001 2. ESRD - s/p renal transplant - Sept. 2012  3. GI bleed:  4. DM    [name] is a 82 y.o. female who presents  Today to follow-up shortness of breath. I saw her last April, 2015. Before that she had had some difficulties with shortness of breath. We thought that some of her symptoms were related to increased levels of Prograf.  Fortunately she is doing well. She is not having any chest pain or significant shortness of breath.  March 07, 2016:  Doing well.  Previous patient of Dr. Myrtis Ser. No issues.   Some DOE if she walks too far.  No Cp , has not had to take any NTG .  March 14, 2017:  Has had some dyspnea with exertion,  Seems to have worsened over the past month or so Has to stop and rest twice when walking to the mailbox   Has had some allergy issues Stays fatigued.   March 04, 2018:  [name] is seen back today for follow-up of her coronary artery disease. Been started on home oxygen.  She has history of chronic asthma end-stage renal disease, status post renal transplant Was in the hospital in March - was not D/C'd on home O2.  Has developed worsening dyspnea . O2 sats were in the 80s  No CP  O2 sat was 81% when she arrived in the office today  Came up to 90% with O2  2 liters / min   March 04, 2020:  [name] is seen back today for follow-up of her coronary artery disease.  She has a history of coronary artery bypass grafting in 2001.  She has end-stage renal disease and is s/p renal transplant about 9 years.   Avoids salt ,  Fried foods  She had lipids drawn at Dr. Fortunato Curling office in February.  Her total cholesterol is 140.  The triglyceride level is 142.  The HDL is  54.  The LDL is 62.  Complete metabolic profile drawn at that time reveals a creatinine of 1.57.  The sodium is 141.  The potassium is 4.6.  Liver enzymes look normal.   Sept. 12, 2022  [name] is seen today for follow up of CAD, CABG, ESRD, HLD BP is a bit elevated - has not taken her meds Coughs with swallowing fairly frequently  Concerning for aspiration .  Avoids salt and salty foods    Nov. 14, 2023 [name] is seen for follow up of her CAD, CABG, ESRD, HLD  Has severe astham Going to pulmonary rehab   No CP ,  Has indigestion like disfomfort ,  very similar to her presenting angina   Will get a lexiscan myoview  Heart burn is very frequent ,  is not worsened by exercise .  Is on omeprazole  On HD in the past Now has a renal transplant   Apr 09, 2023 [name] is seen for follow up of her CAD , CABG, ESRD, HLD Has severe asthma Has renal transplant in 2012.  No CP , Has severe DOE Pants qute a bit with any exertion  Lexiscan myoview in Nov. 2023 showed no  ischema  Prior inferoapical MI   Previous echocardiogram was in March, 2019.  She had normal left ventricular systolic function.  She had grade 1 diastolic dysfunction. RV was normal in size and function. She now uses 4 Lpm of oxygen at home.  She was wheezing quite a bit when walking back to the exam room  Hx of anemia     Nov. 18, 2024 [name] is seen for follow up of her CAD, CABG, ESRD ( now s/p kidney transplant ) , HLD Echo 05/10/23 - normal LV systolic function , normal GLS  Mild - moderate AI, no AS Moderate PI   No CP  Has some tightness with walking  Myoview from Nov. 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, hold ASA 2-3 weeks - consider plavix instead of ASA   Asthma    Cellulitis 12/19/2019   right upper arm   Coronary artery disease    cath 11/2007, grafts patent /  Nuclear, June, 2011,  prior inferior MI with mild peri-infarct ischemia, anterior breast attenuation, EF 67%, done for renal transplant assessment / Low level exercise/Lexiscan Myoview (11/2013): EF 67%, no ischemi; normal study   Diabetes mellitus    type 2   ESRD on dialysis [facility])    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, 2012   Family history of breast cancer    Family history of prostate cancer    GERD (gastroesophageal reflux disease)    GI bleed 11/26/2010   January, 2012 , AVM   Gout    History of methicillin resistant staphylococcus aureus (MRSA)    Hyperlipidemia    Hyperparathyroidism    Hypertension    Myocardial infarction [facility])    Osteoporosis    Pneumonia 08/2010    Hospitalization, October, 2011   Primary osteoarthritis, 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   S/P kidney transplant    September, 2012, [facility]   Subacromial impingement of 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    Past Surgical History:  Procedure Laterality Date   ABDOMINAL HYSTERECTOMY  1980'S   ARTERIOVENOUS GRAFT PLACEMENT Left 03/04/2007   forearm   BIOPSY  09/30/2019   Procedure: BIOPSY;  Surgeon: Charlott Rakes, MD;  Location: [facility];  Service: Endoscopy;;   BIOPSY  12/27/2021   Procedure: BIOPSY;  Surgeon: Charlott Rakes, MD;  Location: [facility];  Service: Endoscopy;;   BREAST EXCISIONAL BIOPSY Right    benign more than 10 yr ago   BREAST SURGERY  YRS AGO   RT BREAST CYST REMOVED    CARDIAC CATHETERIZATION  06/03/2002; 12/04/2007   COLONOSCOPY WITH PROPOFOL N/A 07/14/2014   Procedure: COLONOSCOPY WITH PROPOFOL;  Surgeon: Shirley Friar, MD;  Location: [facility];  Service: Endoscopy;  Laterality: N/A;   COLONOSCOPY WITH PROPOFOL N/A 09/30/2019   Procedure: COLONOSCOPY WITH PROPOFOL;  Surgeon: Charlott Rakes, MD;  Location: [facility];  Service: Endoscopy;  Laterality: N/A;   COLONOSCOPY WITH PROPOFOL N/A 12/27/2021   Procedure: COLONOSCOPY WITH PROPOFOL;  Surgeon: Charlott Rakes, MD;  Location: [facility];  Service: Endoscopy;   Laterality: N/A;   CORONARY ARTERY BYPASS GRAFT  06/06/2002   x 4   ESOPHAGOGASTRODUODENOSCOPY N/A 12/27/2021   Procedure: ESOPHAGOGASTRODUODENOSCOPY (EGD);  Surgeon: Charlott Rakes, MD;  Location: [facility];  Service: Endoscopy;  Laterality: N/A;   HEMOSTASIS CLIP PLACEMENT  12/27/2021   Procedure: HEMOSTASIS CLIP PLACEMENT;  Surgeon: Charlott Rakes, MD;  Location:  [facility];  Service: Endoscopy;;   HOT HEMOSTASIS N/A 07/14/2014   Procedure: HOT HEMOSTASIS (ARGON PLASMA COAGULATION/BICAP);  Surgeon: Shirley Friar, MD;  Location: [facility];  Service: Endoscopy;  Laterality: N/A;   IR FLUORO GUIDE CV LINE RIGHT  12/22/2019   IR US GUIDE VASC ACCESS RIGHT  12/22/2019   KIDNEY TRANSPLANT Right 08/04/2011   ORIF PATELLA Left 04/06/2016   Procedure: OPEN REDUCTION INTERNAL (ORIF) LEFT  PATELLA;  Surgeon: Loreta Ave, MD;  Location: [facility];  Service: Orthopedics;  Laterality: Left;   POLYPECTOMY  09/30/2019   Procedure: POLYPECTOMY;  Surgeon: Charlott Rakes, MD;  Location: [facility];  Service: Endoscopy;;   POLYPECTOMY  12/27/2021   Procedure: POLYPECTOMY;  Surgeon: Charlott Rakes, MD;  Location: [facility];  Service: Endoscopy;;   SHOULDER ARTHROSCOPY WITH ROTATOR CUFF REPAIR AND SUBACROMIAL DECOMPRESSION Left 08/03/2016   Procedure: LEFT SHOULDER ARTHROSCOPY WITH ROTATOR CUFF REPAIR AND SUBACROMIAL DECOMPRESSION with distal claviculectomy and extentsive debridement;  Surgeon: Loreta Ave, MD;  Location:  SURGERY CENTER;  Service: Orthopedics;  Laterality: Left;  Block   THROMBECTOMY AND REVISION OF ARTERIOVENTOUS (AV) GORETEX  GRAFT Left 05/08/2007   forearm   TOTAL HIP ARTHROPLASTY  12/18/2012   Procedure: TOTAL HIP ARTHROPLASTY;  Surgeon: Loreta Ave, MD;  Location: [facility] OR;  Service: Orthopedics;  Laterality: Left;   VESICOVAGINAL FISTULA CLOSURE W/ TAH  1984    Patient Active Problem List   Diagnosis Date Noted   Coronary artery  disease     Priority: High   Transplanted kidney 08/08/2023   AKI (acute kidney injury) (HCC) 08/07/2023   Kidney transplant recipient 08/07/2023   COPD exacerbation (HCC) 08/06/2023   Acute on chronic hypoxic respiratory failure (HCC) 08/05/2023   History of renal transplantation 07/10/2022   Asymptomatic menopausal state 11/03/2021   Olecranon bursitis of right elbow 12/26/2019   Right arm cellulitis 12/18/2019   Fever    Hx of colonic polyps 09/30/2019   Family history of breast cancer    Family history of prostate cancer    Family history of lung cancer 08/08/2018   Shoulder pain, right 04/04/2018   Severe persistent asthma with (acute) exacerbation 02/13/2018   GERD (gastroesophageal reflux disease) 02/12/2018   Severe persistent asthma with acute exacerbation 02/12/2018   Complete rotator cuff tear of 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   Subacromial impingement of 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   Primary osteoarthritis, 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   History of methicillin resistant staphylococcus aureus (MRSA)    Benign neoplasm of colon 07/14/2014   Nausea & vomiting 12/22/2013   Dyspnea 12/22/2013   Diabetes (HCC) 02/28/2013   Preop cardiovascular exam    Type II or unspecified type diabetes mellitus without mention of complication, not stated as uncontrolled 06/05/2012   Encounter for long-term (current) use of other medications 02/26/2012   Type II or unspecified type diabetes mellitus with renal manifestations, not stated as uncontrolled(250.40) 02/26/2012   S/P kidney transplant    H/O steroid therapy    Anemia    Hyperlipidemia    Hypertension    Aspirin allergy    Hx of CABG    Ejection fraction    Anemia    GI bleed 11/26/2010   Pneumonia 08/20/2010   FOOT PAIN 01/20/2010   THROMBOCYTOPENIA 01/11/2009   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     albuterol (PROVENTIL) (2.5 MG/3ML) 0.083% nebulizer solution Take 2.5 mg by nebulization every 6 (six) hours as needed for wheezing or shortness of breath.     albuterol (VENTOLIN HFA) 108 (90 Base) MCG/ACT inhaler Inhale 1-2 puffs into the lungs every 6 (six) hours as needed for wheezing or shortness of breath.     amLODipine (NORVASC) 10 MG tablet Take 10 mg by mouth daily.      aspirin EC 81 MG tablet Take 81 mg by mouth daily.     Azelastine HCl 0.15 % SOLN Place 1 spray into both nostrils daily as needed for allergies.     budesonide (PULMICORT) 0.25 MG/2ML nebulizer solution Inhale 0.25 mg into the lungs as needed.     calcitRIOL (ROCALTROL) 0.25 MCG capsule Take 0.25 mcg by mouth daily.      ezetimibe (ZETIA) 10 MG tablet TAKE 1 TABLET BY MOUTH EVERY DAY 90 tablet 1   fluticasone (FLONASE) 50 MCG/ACT nasal spray Place into both nostrils.     fluticasone furoate-vilanterol (BREO ELLIPTA) 200-25 MCG/INH AEPB Inhale 1 puff into the lungs daily as needed (respiratory issues.).     Fluticasone-Umeclidin-Vilant (TRELEGY ELLIPTA) 200-62.5-25 MCG/ACT AEPB Inhale 1 puff into the lungs daily.     isosorbide mononitrate (IMDUR) 30 MG 24 hr tablet TAKE 1 TABLET (30 MG TOTAL) BY MOUTH DAILY. PLEASE KEEP SCHEDULE APPOINTMENT FOR FUTURE REFILLS 90 tablet 1   metoprolol tartrate (LOPRESSOR) 50 MG tablet TAKE 1 TABLET BY MOUTH TWICE A DAY 180 tablet 3   nitroGLYCERIN (NITROSTAT) 0.4 MG SL tablet Place 1 tablet (0.4 mg total) under the tongue every 5 (five) minutes as needed for chest pain. 25 tablet 1   omeprazole (PRILOSEC) 20 MG capsule Take 20 mg by mouth daily after breakfast.     ONETOUCH VERIO test strip 1 EACH BY OTHER ROUTE DAILY IN THE AFTERNOON. USE AS INSTRUCTED 100 strip 3   potassium chloride SA (KLOR-CON M20)  20 MEQ tablet TAKE 1 TABLET BY MOUTH EVERY DAY 90 tablet 3   predniSONE (DELTASONE) 20 MG tablet Take 2 tablets (40 mg total) by mouth daily with breakfast. 2 tablet 0   predniSONE (DELTASONE) 5 MG tablet Take 5 mg by mouth 2 (two) times daily.     sennosides-docusate sodium (SENOKOT-S) 8.6-50 MG tablet Take 1 tablet by mouth daily as needed (constipation.).      tacrolimus (PROGRAF) 1 MG capsule Take 4 mg by mouth See admin instructions. Take 4 mg by mouth at 9 AM and 4 mg at 9 PM     Tiotropium Bromide Monohydrate 1.25 MCG/ACT AERS Inhale 1 puff into the lungs daily.     No current facility-administered medications for this visit.    Allergies:   Sodium hypochlorite, Lactose, Asa arthritis strength-antacid [aspirin buffered], Aspirin, Atorvastatin, Banana, Lactose intolerance (gi), and Lisinopril    Social History:  The patient  reports that she quit smoking about 39 years ago. Her smoking use included cigarettes. She started smoking about 47 years ago. She has a 8 pack-year smoking history. She has never used smokeless tobacco. She reports that she does not drink alcohol and does not use drugs.   Family History:  The patient's family history includes Lung cancer in her brother; Prostate cancer in her brother.    ROS:  Please see the history of present illness.  Patient denies fever, chills, headache, sweats, rash, change in vision, change in hearing, chest pain, cough, nausea or vomiting, urinary symptoms. All other systems are reviewed and are negative.    Physical Exam: Blood pressure 114/70, pulse 73, height 5\' 2"  (1.575 m), weight 132 lb 12.8 oz (60.2 kg), last menstrual period 03/04/2022, SpO2 (!) 77%.       GEN:  elderly female,    in moderate respiratory distress after walking in from the parking lots  HEENT: Normal NECK: No JVD; No carotid bruits LYMPHATICS: No lymphadenopathy CARDIAC: RRR , no murmurs, rubs, gallops RESPIRATORY:  Clear to auscultation without rales,  wheezing or rhonchi  ABDOMEN: Soft, non-tender, non-distended MUSCULOSKELETAL:  No edema; No deformity  SKIN: Warm and dry NEUROLOGIC:  Alert and oriented x 3   EKG:          Recent Labs: 08/05/2023: ALT 40; B Natriuretic Peptide 1,251.6 08/07/2023: Hemoglobin 11.5; Platelets 160 08/08/2023: BUN 36; Creatinine, Ser 1.65; Potassium 4.6; Sodium 138    Lipid Panel    Component Value Date/Time   CHOL 138 10/17/2021 0847   TRIG 82 10/17/2021 0847   HDL 65 10/17/2021 0847   CHOLHDL 2.1 10/17/2021 0847   CHOLHDL 2.6 02/14/2018 0223   VLDL 20 02/14/2018 0223   LDLCALC 57 10/17/2021 0847   LDLDIRECT 136.3 02/26/2012 1715      Wt Readings from Last 3 Encounters:  10/08/23 132 lb 12.8 oz (60.2 kg)  08/08/23 139 lb 8.8 oz (63.3 kg)  04/09/23 139 lb 9.6 oz (63.3 kg)      Current medicines are reviewed   Patient understands her medications well.     ASSESSMENT AND PLAN:  1. CAD - CABG , 2001   No angina  Myoview from last Nov. Looks great .   2. COPD :   Shortness breath with exertion :  she has significant COPD , is on home O2  .  Gets very short of breath with ambulation    3. ESRD - s/p renal transplant.       4.  Hyperlipidemia: stable   5. DM  -  6.  HTN:  BP is well controlled.     [name], MD  10/08/2023 10:31 AM    [facility] [address] [place],  [address] [place], [place]  [address] Pager [phone] Phone: [phone]; Fax: [phone]

## 2023-10-08 ENCOUNTER — Ambulatory Visit: Payer: 59 | Attending: Cardiovascular Disease | Admitting: Cardiovascular Disease

## 2023-10-08 ENCOUNTER — Encounter: Payer: Self-pay | Admitting: Cardiovascular Disease

## 2023-10-08 VITALS — BP 114/70 | HR 73 | Ht 62.0 in | Wt 132.8 lb

## 2023-10-08 DIAGNOSIS — I10 Essential (primary) hypertension: Secondary | ICD-10-CM | POA: Diagnosis not present

## 2023-10-08 DIAGNOSIS — I2510 Atherosclerotic heart disease of native coronary artery without angina pectoris: Secondary | ICD-10-CM

## 2023-10-08 NOTE — Patient Instructions (Signed)
Medication Instructions:  Your physician recommends that you continue on your current medications as directed. Please refer to the Current Medication list given to you today.  *If you need a refill on your cardiac medications before your next appointment, please call your pharmacy*  Lab Work: If you have labs (blood work) drawn today and your tests are completely normal, you will receive your results only by: MyChart Message (if you have MyChart) OR A paper copy in the mail If you have any lab test that is abnormal or we need to change your treatment, we will call you to review the results.   Testing/Procedures: None ordered today.  Follow-Up: At [facility], you and your health needs are our priority.  As part of our continuing mission to provide you with exceptional heart care, we have created designated Provider Care Teams.  These Care Teams include your primary Cardiologist (physician) and Advanced Practice Providers (APPs -  Physician Assistants and Nurse Practitioners) who all work together to provide you with the care you need, when you need it.  We recommend signing up for the patient portal called "MyChart".  Sign up information is provided on this After Visit Summary.  MyChart is used to connect with patients for Virtual Visits (Telemedicine).  Patients are able to view lab/test results, encounter notes, upcoming appointments, etc.  Non-urgent messages can be sent to your provider as well.   To learn more about what you can do with MyChart, go to [email or web address].    Your next appointment:   1 year(s)  Provider:   Maisie Fus, MD

## 2023-10-24 ENCOUNTER — Encounter: Payer: Self-pay | Admitting: Internal Medicine

## 2023-10-24 ENCOUNTER — Ambulatory Visit (INDEPENDENT_AMBULATORY_CARE_PROVIDER_SITE_OTHER): Payer: 59 | Admitting: Internal Medicine

## 2023-10-24 VITALS — BP 138/84 | HR 60 | Ht 62.0 in | Wt 128.0 lb

## 2023-10-24 DIAGNOSIS — Z794 Long term (current) use of insulin: Secondary | ICD-10-CM

## 2023-10-24 DIAGNOSIS — N1831 Chronic kidney disease, stage 3a: Secondary | ICD-10-CM

## 2023-10-24 DIAGNOSIS — E1122 Type 2 diabetes mellitus with diabetic chronic kidney disease: Secondary | ICD-10-CM

## 2023-10-24 DIAGNOSIS — Z23 Encounter for immunization: Secondary | ICD-10-CM

## 2023-10-24 DIAGNOSIS — Z940 Kidney transplant status: Secondary | ICD-10-CM

## 2023-10-24 LAB — POCT GLUCOSE (DEVICE FOR HOME USE): Glucose Fasting, POC: 113 mg/dL — AB (ref 70–99)

## 2023-10-24 LAB — POCT GLYCOSYLATED HEMOGLOBIN (HGB A1C): Hemoglobin A1C: 6.1 % — AB (ref 4.0–5.6)

## 2023-10-24 NOTE — Progress Notes (Signed)
Name: [name]  Age/ Sex: 82 y.o., female   MRN/ DOB: [id number], 1941/08/19     PCP: Avel Peace Chales Salmon, MD   Reason for Endocrinology Evaluation: Type 2 Diabetes Mellitus  Initial Endocrine Consultative Visit: 02/28/2013    PATIENT IDENTIFIER: [name] is a 82 y.o. female with a past medical history of T2DM, CAD, Hx of ESRD ( S/P transplant 2013) . The patient has followed with Endocrinology clinic since 02/28/2013 for consultative assistance with management of her diabetes.  DIABETIC HISTORY:  [name] was diagnosed with DM 2013.  She was on oral glycemic agents such as glipizide (discontinued in 2019) Her hemoglobin A1c has ranged from 5.5% in 2014, peaking at 6.5% in 2022.  She was on Glipizide but discontinued in 2019  She was followed by Dr. Everardo All from 2014 until 02/2022    SUBJECTIVE:   During the last visit (01/01/2023): A1c 6.0%    Today (10/24/2023): [name] is here for a follow up on diabetes management .  She checks her blood sugars 3x a week.  She was evaluated by cardiology for shortness of breath, CAD 10/08/2023 Shortness of breath has been attributed to COPD She had a follow-up with renal transplant team   Follows with nephrology Dr. 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, hold ASA 2-3 weeks - consider plavix instead of ASA   Asthma    Cellulitis 12/19/2019   right upper arm    Coronary artery disease    cath 11/2007, grafts patent /  Nuclear, June, 2011, prior inferior MI with mild peri-infarct ischemia, anterior breast attenuation, EF 67%, done for renal transplant assessment / Low level exercise/Lexiscan Myoview (11/2013): EF 67%, no ischemi; normal study   Diabetes mellitus    type 2   ESRD on dialysis [facility])    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, 2012   Family history of breast cancer    Family history of prostate cancer    GERD (gastroesophageal reflux disease)    GI bleed 11/26/2010   January, 2012 , AVM   Gout    History of methicillin resistant staphylococcus aureus (MRSA)    Hyperlipidemia    Hyperparathyroidism    Hypertension    Myocardial infarction [facility])    Osteoporosis    Pneumonia 08/2010    Hospitalization, October, 2011   Primary osteoarthritis, 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   S/P kidney transplant    September, 2012, [facility]   Subacromial impingement of 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   Past Surgical History:  Past Surgical History:  Procedure Laterality Date   ABDOMINAL HYSTERECTOMY  1980'S   ARTERIOVENOUS GRAFT PLACEMENT Left 03/04/2007   forearm   BIOPSY  09/30/2019   Procedure: BIOPSY;  Surgeon: Charlott Rakes, MD;  Location: [facility];  Service: Endoscopy;;   BIOPSY  12/27/2021   Procedure: BIOPSY;  Surgeon: Charlott Rakes, MD;  Location: [facility];  Service: Endoscopy;;  BREAST EXCISIONAL BIOPSY Right    benign more than 10 yr ago   BREAST SURGERY  YRS AGO   RT BREAST CYST REMOVED    CARDIAC CATHETERIZATION  06/03/2002; 12/04/2007   COLONOSCOPY WITH PROPOFOL N/A 07/14/2014   Procedure: COLONOSCOPY WITH PROPOFOL;  Surgeon: Shirley Friar, MD;  Location: [facility];  Service: Endoscopy;  Laterality: N/A;   COLONOSCOPY WITH PROPOFOL N/A 09/30/2019   Procedure: COLONOSCOPY WITH PROPOFOL;  Surgeon: Charlott Rakes, MD;  Location: [facility];  Service: Endoscopy;  Laterality: N/A;   COLONOSCOPY WITH PROPOFOL N/A 12/27/2021   Procedure:  COLONOSCOPY WITH PROPOFOL;  Surgeon: Charlott Rakes, MD;  Location: [facility];  Service: Endoscopy;  Laterality: N/A;   CORONARY ARTERY BYPASS GRAFT  06/06/2002   x 4   ESOPHAGOGASTRODUODENOSCOPY N/A 12/27/2021   Procedure: ESOPHAGOGASTRODUODENOSCOPY (EGD);  Surgeon: Charlott Rakes, MD;  Location: [facility];  Service: Endoscopy;  Laterality: N/A;   HEMOSTASIS CLIP PLACEMENT  12/27/2021   Procedure: HEMOSTASIS CLIP PLACEMENT;  Surgeon: Charlott Rakes, MD;  Location: [facility];  Service: Endoscopy;;   HOT HEMOSTASIS N/A 07/14/2014   Procedure: HOT HEMOSTASIS (ARGON PLASMA COAGULATION/BICAP);  Surgeon: Shirley Friar, MD;  Location: [facility];  Service: Endoscopy;  Laterality: N/A;   IR FLUORO GUIDE CV LINE RIGHT  12/22/2019   IR US GUIDE VASC ACCESS RIGHT  12/22/2019   KIDNEY TRANSPLANT Right 08/04/2011   ORIF PATELLA Left 04/06/2016   Procedure: OPEN REDUCTION INTERNAL (ORIF) LEFT  PATELLA;  Surgeon: Loreta Ave, MD;  Location: [facility];  Service: Orthopedics;  Laterality: Left;   POLYPECTOMY  09/30/2019   Procedure: POLYPECTOMY;  Surgeon: Charlott Rakes, MD;  Location: [facility];  Service: Endoscopy;;   POLYPECTOMY  12/27/2021   Procedure: POLYPECTOMY;  Surgeon: Charlott Rakes, MD;  Location: [facility];  Service: Endoscopy;;   SHOULDER ARTHROSCOPY WITH ROTATOR CUFF REPAIR AND SUBACROMIAL DECOMPRESSION Left 08/03/2016   Procedure: LEFT SHOULDER ARTHROSCOPY WITH ROTATOR CUFF REPAIR AND SUBACROMIAL DECOMPRESSION with distal claviculectomy and extentsive debridement;  Surgeon: Loreta Ave, MD;  Location: [facility];  Service: Orthopedics;  Laterality: Left;  Block   THROMBECTOMY AND REVISION OF ARTERIOVENTOUS (AV) GORETEX  GRAFT Left 05/08/2007   forearm   TOTAL HIP ARTHROPLASTY  12/18/2012   Procedure: TOTAL HIP ARTHROPLASTY;  Surgeon: Loreta Ave, MD;  Location: [facility];  Service: Orthopedics;  Laterality: Left;   VESICOVAGINAL  FISTULA CLOSURE W/ TAH  1984   Social History:  reports that she quit smoking about 39 years ago. Her smoking use included cigarettes. She started smoking about 47 years ago. She has a 8 pack-year smoking history. She has never used smokeless tobacco. She reports that she does not drink alcohol and does not use drugs. Family History:  Family History  Problem Relation Age of Onset   Prostate cancer Brother    Lung cancer Brother    Cancer Neg Hx      HOME MEDICATIONS: Allergies as of 10/24/2023       Reactions   Sodium Hypochlorite Shortness Of Breath   Lactose Nausea And Vomiting   Asa Arthritis Strength-antacid [aspirin Buffered] Nausea And Vomiting, Other (See Comments)   STOMACH BURNS, also   Aspirin Nausea And Vomiting   Per pt. "can tolerate the enteric coated tablets".    Atorvastatin Other (See Comments)   Patient's skin was skin was sensitive   Banana Nausea And Vomiting   Lactose Intolerance (gi) Nausea And Vomiting   Lisinopril Cough  Medication List        Accurate as of October 24, 2023 10:23 AM. If you have any questions, ask your nurse or doctor.          acetaminophen 650 MG CR tablet Commonly known as: TYLENOL Take 650 mg by mouth every morning.   albuterol (2.5 MG/3ML) 0.083% nebulizer solution Commonly known as: PROVENTIL Take 2.5 mg by nebulization every 6 (six) hours as needed for wheezing or shortness of breath.   albuterol 108 (90 Base) MCG/ACT inhaler Commonly known as: VENTOLIN HFA Inhale 1-2 puffs into the lungs every 6 (six) hours as needed for wheezing or shortness of breath.   amLODipine 10 MG tablet Commonly known as: NORVASC Take 10 mg by mouth daily.   aspirin EC 81 MG tablet Take 81 mg by mouth daily.   Azelastine HCl 0.15 % Soln Place 1 spray into both nostrils daily as needed for allergies.   budesonide 0.25 MG/2ML nebulizer solution Commonly known as: PULMICORT Inhale 0.25 mg into the lungs as needed.    calcitRIOL 0.25 MCG capsule Commonly known as: ROCALTROL Take 0.25 mcg by mouth daily.   ezetimibe 10 MG tablet Commonly known as: ZETIA TAKE 1 TABLET BY MOUTH EVERY DAY   fluticasone 50 MCG/ACT nasal spray Commonly known as: FLONASE Place into both nostrils.   fluticasone furoate-vilanterol 200-25 MCG/INH Aepb Commonly known as: BREO ELLIPTA Inhale 1 puff into the lungs daily as needed (respiratory issues.).   isosorbide mononitrate 30 MG 24 hr tablet Commonly known as: IMDUR TAKE 1 TABLET (30 MG TOTAL) BY MOUTH DAILY. PLEASE KEEP SCHEDULE APPOINTMENT FOR FUTURE REFILLS   Klor-Con M20 20 MEQ tablet Generic drug: potassium chloride SA TAKE 1 TABLET BY MOUTH EVERY DAY   metoprolol tartrate 50 MG tablet Commonly known as: LOPRESSOR TAKE 1 TABLET BY MOUTH TWICE A DAY   nitroGLYCERIN 0.4 MG SL tablet Commonly known as: NITROSTAT Place 1 tablet (0.4 mg total) under the tongue every 5 (five) minutes as needed for chest pain.   omeprazole 20 MG capsule Commonly known as: PRILOSEC Take 20 mg by mouth daily after breakfast.   OneTouch Verio test strip Generic drug: glucose blood 1 EACH BY OTHER ROUTE DAILY IN THE AFTERNOON. USE AS INSTRUCTED   predniSONE 5 MG tablet Commonly known as: DELTASONE Take 5 mg by mouth 2 (two) times daily.   predniSONE 20 MG tablet Commonly known as: DELTASONE Take 2 tablets (40 mg total) by mouth daily with breakfast.   sennosides-docusate sodium 8.6-50 MG tablet Commonly known as: SENOKOT-S Take 1 tablet by mouth daily as needed (constipation.).   tacrolimus 1 MG capsule Commonly known as: PROGRAF Take 4 mg by mouth See admin instructions. Take 4 mg by mouth at 9 AM and 4 mg at 9 PM   Tiotropium Bromide Monohydrate 1.25 MCG/ACT Aers Inhale 1 puff into the lungs daily.   Trelegy Ellipta 200-62.5-25 MCG/ACT Aepb Generic drug: Fluticasone-Umeclidin-Vilant Inhale 1 puff into the lungs daily.         OBJECTIVE:   Vital Signs: BP  138/84 (BP Location: Left Arm, Patient Position: Sitting, Cuff Size: Small)   Pulse 60   Ht 5\' 2"  (1.575 m)   Wt 128 lb (58.1 kg)   LMP 03/04/2022   SpO2 93%   BMI 23.41 kg/m   Wt Readings from Last 3 Encounters:  10/24/23 128 lb (58.1 kg)  10/08/23 132 lb 12.8 oz (60.2 kg)  08/08/23 139 lb 8.8 oz (63.3 kg)  Exam: General: Pt appears well and is in NAD  Lungs: Clear with good BS bilat   Heart: RRR   Extremities: No pretibial edema.   Neuro: MS is good with appropriate affect, pt is alert and Ox3    DM foot exam: 10/24/2023  The skin of the feet is intact without sores or ulcerations. The pedal pulses are 1+ on right and 1+ on left. The sensation is intact to a screening 5.07, 10 gram monofilament bilaterally          DATA REVIEWED:  Lab Results  Component Value Date   HGBA1C 6.0 (A) 01/01/2023   HGBA1C 6.4 (A) 07/10/2022   HGBA1C 6.1 (A) 02/22/2022    Latest Reference Range & Units 08/08/23 06:30  Sodium 135 - 145 mmol/L 138  Potassium 3.5 - 5.1 mmol/L 4.6  Chloride 98 - 111 mmol/L 108  CO2 22 - 32 mmol/L 23  Glucose 70 - 99 mg/dL 161 (H)  BUN 8 - 23 mg/dL 36 (H)  Creatinine 0.96 - 1.00 mg/dL 0.45 (H)  Calcium 8.9 - 10.3 mg/dL 8.6 (L)  Anion gap 5 - 15  7  Phosphorus 2.5 - 4.6 mg/dL 3.9  Albumin 3.5 - 5.0 g/dL 3.0 (L)  GFR, Estimated >60 mL/min 31 (L)    ASSESSMENT / PLAN / RECOMMENDATIONS:   1) Type 2 Diabetes Mellitus, Optimally controlled, With CKD III (S/P renal transplant) and macrovascular  complications - Most recent A1c of 6.1 %. Goal A1c < 7.0 %.     -Her A1c is optimal, in office BG 113 Mg/DL -She was  on glipizide which was discontinued in 2019 -Since the patient is not on any glycemic agents, I will turn over her care to her PCP.     MEDICATIONS: N/a  EDUCATION / INSTRUCTIONS: BG monitoring instructions: Patient is instructed to check her blood sugars 2-3 times a week.   2) Diabetic complications:  Eye: Does not have known  diabetic retinopathy.  Neuro/ Feet: Does not have known diabetic peripheral neuropathy .  Renal: Patient does  have known baseline CKD. She   is not on an ACEI/ARB at present.  She is s/p P renal transplant, follows with nephrology     Recommend continuation of current Tx with primary MD and consultative f/u at [facility] Endocrine clinic in the future if pt's DM control becomes problematic.    Signed electronically by: Lyndle Herrlich, MD  [facility] Endocrinology  [facility] [address] [place], [place] [address] Phone: [phone] FAX: [phone]   CC: Beam, Chales Salmon, MD [address] [place] [address] Phone: [phone]  Fax: [phone]  Return to Endocrinology clinic as below: No future appointments.

## 2023-10-24 NOTE — Patient Instructions (Signed)
Your A1c is perfect at 6.1%, since you are not on any diabetes medications, I will turn over the diabetes care to primary care physician Dr. Jasper Loser.  We will be happy to see you again if needed

## 2023-11-21 ENCOUNTER — Other Ambulatory Visit: Payer: Self-pay | Admitting: Cardiovascular Disease

## 2023-12-09 LAB — LAB REPORT - SCANNED
Creatinine, POC: 132.6 mg/dL
EGFR: 34

## 2024-01-16 ENCOUNTER — Other Ambulatory Visit: Payer: Self-pay | Admitting: Cardiovascular Disease

## 2024-01-16 ENCOUNTER — Other Ambulatory Visit: Payer: Self-pay | Admitting: Physician Assistant

## 2024-03-22 ENCOUNTER — Emergency Department (HOSPITAL_COMMUNITY)

## 2024-03-22 ENCOUNTER — Other Ambulatory Visit: Payer: Self-pay

## 2024-03-22 ENCOUNTER — Inpatient Hospital Stay (HOSPITAL_COMMUNITY)
Admission: EM | Admit: 2024-03-22 | Discharge: 2024-03-25 | DRG: 291 | Disposition: A | Discharge status: Home Health | Attending: Internal Medicine | Admitting: Internal Medicine

## 2024-03-22 ENCOUNTER — Encounter (HOSPITAL_COMMUNITY): Payer: Self-pay

## 2024-03-22 DIAGNOSIS — E1122 Type 2 diabetes mellitus with diabetic chronic kidney disease: Secondary | ICD-10-CM | POA: Diagnosis present

## 2024-03-22 DIAGNOSIS — I509 Heart failure, unspecified: Secondary | ICD-10-CM

## 2024-03-22 DIAGNOSIS — D84821 Immunodeficiency due to drugs: Secondary | ICD-10-CM | POA: Diagnosis present

## 2024-03-22 DIAGNOSIS — J441 Chronic obstructive pulmonary disease with (acute) exacerbation: Secondary | ICD-10-CM | POA: Diagnosis present

## 2024-03-22 DIAGNOSIS — E119 Type 2 diabetes mellitus without complications: Secondary | ICD-10-CM | POA: Diagnosis not present

## 2024-03-22 DIAGNOSIS — Z7982 Long term (current) use of aspirin: Secondary | ICD-10-CM | POA: Diagnosis not present

## 2024-03-22 DIAGNOSIS — I10 Essential (primary) hypertension: Secondary | ICD-10-CM | POA: Diagnosis present

## 2024-03-22 DIAGNOSIS — N183 Chronic kidney disease, stage 3 unspecified: Secondary | ICD-10-CM | POA: Insufficient documentation

## 2024-03-22 DIAGNOSIS — M109 Gout, unspecified: Secondary | ICD-10-CM | POA: Diagnosis present

## 2024-03-22 DIAGNOSIS — Z79621 Long term (current) use of calcineurin inhibitor: Secondary | ICD-10-CM | POA: Diagnosis not present

## 2024-03-22 DIAGNOSIS — E785 Hyperlipidemia, unspecified: Secondary | ICD-10-CM | POA: Diagnosis present

## 2024-03-22 DIAGNOSIS — D696 Thrombocytopenia, unspecified: Secondary | ICD-10-CM | POA: Diagnosis present

## 2024-03-22 DIAGNOSIS — I2510 Atherosclerotic heart disease of native coronary artery without angina pectoris: Secondary | ICD-10-CM | POA: Diagnosis present

## 2024-03-22 DIAGNOSIS — Z9981 Dependence on supplemental oxygen: Secondary | ICD-10-CM | POA: Diagnosis not present

## 2024-03-22 DIAGNOSIS — K219 Gastro-esophageal reflux disease without esophagitis: Secondary | ICD-10-CM | POA: Diagnosis present

## 2024-03-22 DIAGNOSIS — Z7952 Long term (current) use of systemic steroids: Secondary | ICD-10-CM

## 2024-03-22 DIAGNOSIS — J9601 Acute respiratory failure with hypoxia: Secondary | ICD-10-CM | POA: Diagnosis present

## 2024-03-22 DIAGNOSIS — Z87891 Personal history of nicotine dependence: Secondary | ICD-10-CM | POA: Diagnosis not present

## 2024-03-22 DIAGNOSIS — J4551 Severe persistent asthma with (acute) exacerbation: Secondary | ICD-10-CM | POA: Diagnosis present

## 2024-03-22 DIAGNOSIS — B9789 Other viral agents as the cause of diseases classified elsewhere: Secondary | ICD-10-CM | POA: Diagnosis present

## 2024-03-22 DIAGNOSIS — N1832 Chronic kidney disease, stage 3b: Secondary | ICD-10-CM | POA: Diagnosis present

## 2024-03-22 DIAGNOSIS — J9621 Acute and chronic respiratory failure with hypoxia: Secondary | ICD-10-CM | POA: Diagnosis present

## 2024-03-22 DIAGNOSIS — I5033 Acute on chronic diastolic (congestive) heart failure: Secondary | ICD-10-CM | POA: Diagnosis present

## 2024-03-22 DIAGNOSIS — I130 Hypertensive heart and chronic kidney disease with heart failure and stage 1 through stage 4 chronic kidney disease, or unspecified chronic kidney disease: Secondary | ICD-10-CM | POA: Diagnosis present

## 2024-03-22 DIAGNOSIS — Z6821 Body mass index (BMI) 21.0-21.9, adult: Secondary | ICD-10-CM | POA: Diagnosis not present

## 2024-03-22 DIAGNOSIS — R634 Abnormal weight loss: Secondary | ICD-10-CM | POA: Diagnosis present

## 2024-03-22 DIAGNOSIS — Z951 Presence of aortocoronary bypass graft: Secondary | ICD-10-CM | POA: Diagnosis not present

## 2024-03-22 DIAGNOSIS — Z940 Kidney transplant status: Secondary | ICD-10-CM

## 2024-03-22 DIAGNOSIS — R0609 Other forms of dyspnea: Secondary | ICD-10-CM | POA: Diagnosis not present

## 2024-03-22 DIAGNOSIS — Z66 Do not resuscitate: Secondary | ICD-10-CM | POA: Diagnosis present

## 2024-03-22 LAB — RESPIRATORY PANEL BY PCR

## 2024-03-22 LAB — TROPONIN I (HIGH SENSITIVITY)
Troponin I (High Sensitivity): 19 ng/L — ABNORMAL HIGH (ref <18)
Troponin I (High Sensitivity): 23 ng/L — ABNORMAL HIGH (ref <18)
Troponin I (High Sensitivity): 22 ng/L — ABNORMAL HIGH (ref <18)

## 2024-03-22 LAB — COMPREHENSIVE METABOLIC PANEL WITH GFR
ALT: 17 U/L (ref 0–44)
AST: 23 U/L (ref 15–41)
Albumin: 3.5 g/dL (ref 3.5–5.0)
Alkaline Phosphatase: 45 U/L (ref 38–126)
Anion gap: 13 (ref 5–15)
BUN: 25 mg/dL — ABNORMAL HIGH (ref 8–23)
CO2: 21 mmol/L — ABNORMAL LOW (ref 22–32)
Calcium: 9.1 mg/dL (ref 8.9–10.3)
Chloride: 105 mmol/L (ref 98–111)
Creatinine, Ser: 1.6 mg/dL — ABNORMAL HIGH (ref 0.44–1.00)
GFR, Estimated: 32 mL/min — ABNORMAL LOW (ref >60)
Glucose, Bld: 171 mg/dL — ABNORMAL HIGH (ref 70–99)
Potassium: 4.2 mmol/L (ref 3.5–5.1)
Sodium: 139 mmol/L (ref 135–145)
Total Bilirubin: 0.9 mg/dL (ref 0.0–1.2)
Total Protein: 6.3 g/dL — ABNORMAL LOW (ref 6.5–8.1)

## 2024-03-22 LAB — CBC WITH DIFFERENTIAL/PLATELET
Abs Immature Granulocytes: 0.02 10*3/uL (ref 0.00–0.07)
Basophils Absolute: 0 10*3/uL (ref 0.0–0.1)
Basophils Relative: 0 %
Eosinophils Absolute: 0 10*3/uL (ref 0.0–0.5)
Eosinophils Relative: 1 %
HCT: 37 % (ref 36.0–46.0)
Hemoglobin: 12.5 g/dL (ref 12.0–15.0)
Immature Granulocytes: 0 %
Lymphocytes Relative: 16 %
Lymphs Abs: 0.9 10*3/uL (ref 0.7–4.0)
MCH: 24.3 pg — ABNORMAL LOW (ref 26.0–34.0)
MCHC: 33.8 g/dL (ref 30.0–36.0)
MCV: 72 fL — ABNORMAL LOW (ref 80.0–100.0)
Monocytes Absolute: 0.5 10*3/uL (ref 0.1–1.0)
Monocytes Relative: 10 %
Neutro Abs: 4 10*3/uL (ref 1.7–7.7)
Neutrophils Relative %: 73 %
Platelets: 125 10*3/uL — ABNORMAL LOW (ref 150–400)
RBC: 5.14 MIL/uL — ABNORMAL HIGH (ref 3.87–5.11)
RDW: 18.6 % — ABNORMAL HIGH (ref 11.5–15.5)
WBC: 5.5 10*3/uL (ref 4.0–10.5)
nRBC: 0 % (ref 0.0–0.2)

## 2024-03-22 LAB — TSH: TSH: 0.511 u[IU]/mL (ref 0.350–4.500)

## 2024-03-22 LAB — BLOOD GAS, VENOUS
Acid-base deficit: 2.4 mmol/L — ABNORMAL HIGH (ref 0.0–2.0)
Bicarbonate: 23.7 mmol/L (ref 20.0–28.0)
O2 Saturation: 79.3 %
Patient temperature: 37
pCO2, Ven: 45 mmHg (ref 44–60)
pH, Ven: 7.33 (ref 7.25–7.43)
pO2, Ven: 50 mmHg — ABNORMAL HIGH (ref 32–45)

## 2024-03-22 LAB — BRAIN NATRIURETIC PEPTIDE: B Natriuretic Peptide: 785.2 pg/mL — ABNORMAL HIGH (ref 0.0–100.0)

## 2024-03-22 MED ORDER — IPRATROPIUM-ALBUTEROL 0.5-2.5 (3) MG/3ML IN SOLN: 3.0000 mL | RESPIRATORY_TRACT | Status: DC | PRN

## 2024-03-22 MED ORDER — ACETAMINOPHEN 650 MG RE SUPP: 650.0000 mg | Freq: Four times a day (QID) | RECTAL | Status: DC | PRN

## 2024-03-22 MED ORDER — FLUTICASONE PROPIONATE 50 MCG/ACT NA SUSP
2.0000 | Freq: Every day | NASAL | Status: DC
  Administered 2024-03-23 – 2024-03-25 (×3): 2 via NASAL
  Filled 2024-03-22: qty 16

## 2024-03-22 MED ORDER — INSULIN ASPART 100 UNIT/ML IJ SOLN
0.0000 [IU] | Freq: Three times a day (TID) | INTRAMUSCULAR | Status: DC
  Administered 2024-03-23 (×2): 2 [IU] via SUBCUTANEOUS
  Administered 2024-03-23: 1 [IU] via SUBCUTANEOUS
  Administered 2024-03-24: 3 [IU] via SUBCUTANEOUS

## 2024-03-22 MED ORDER — AMLODIPINE BESYLATE 10 MG PO TABS
10.0000 mg | ORAL_TABLET | Freq: Every day | ORAL | Status: DC
  Administered 2024-03-23 – 2024-03-25 (×3): 10 mg via ORAL
  Filled 2024-03-22 (×3): qty 1

## 2024-03-22 MED ORDER — TACROLIMUS 1 MG PO CAPS: 4.0000 mg | ORAL_CAPSULE | ORAL | Status: DC

## 2024-03-22 MED ORDER — ASPIRIN 81 MG PO TBEC
81.0000 mg | DELAYED_RELEASE_TABLET | Freq: Every day | ORAL | Status: DC
  Administered 2024-03-23 – 2024-03-25 (×3): 81 mg via ORAL
  Filled 2024-03-22 (×3): qty 1

## 2024-03-22 MED ORDER — FUROSEMIDE 10 MG/ML IJ SOLN
40.0000 mg | Freq: Once | INTRAMUSCULAR | Status: AC
  Administered 2024-03-22: 40 mg via INTRAVENOUS
  Filled 2024-03-22: qty 4

## 2024-03-22 MED ORDER — ENOXAPARIN SODIUM 30 MG/0.3ML IJ SOSY: 30.0000 mg | PREFILLED_SYRINGE | INTRAMUSCULAR | Status: DC

## 2024-03-22 MED ORDER — NITROGLYCERIN 0.4 MG SL SUBL: 0.4000 mg | SUBLINGUAL_TABLET | SUBLINGUAL | Status: DC | PRN

## 2024-03-22 MED ORDER — CALCITRIOL 0.25 MCG PO CAPS
0.2500 ug | ORAL_CAPSULE | Freq: Every day | ORAL | Status: DC
  Administered 2024-03-23 – 2024-03-25 (×3): 0.25 ug via ORAL
  Filled 2024-03-22 (×3): qty 1

## 2024-03-22 MED ORDER — PANTOPRAZOLE SODIUM 40 MG PO TBEC
40.0000 mg | DELAYED_RELEASE_TABLET | Freq: Every day | ORAL | Status: DC
  Administered 2024-03-23 – 2024-03-25 (×3): 40 mg via ORAL
  Filled 2024-03-22 (×3): qty 1

## 2024-03-22 MED ORDER — HEPARIN SODIUM (PORCINE) 5000 UNIT/ML IJ SOLN
5000.0000 [IU] | Freq: Three times a day (TID) | INTRAMUSCULAR | Status: DC
  Administered 2024-03-22 – 2024-03-25 (×8): 5000 [IU] via SUBCUTANEOUS
  Filled 2024-03-22 (×9): qty 1

## 2024-03-22 MED ORDER — TACROLIMUS 1 MG PO CAPS
4.0000 mg | ORAL_CAPSULE | Freq: Two times a day (BID) | ORAL | Status: DC
  Administered 2024-03-22 – 2024-03-25 (×6): 4 mg via ORAL
  Filled 2024-03-22 (×7): qty 4

## 2024-03-22 MED ORDER — IPRATROPIUM-ALBUTEROL 0.5-2.5 (3) MG/3ML IN SOLN
9.0000 mL | Freq: Once | RESPIRATORY_TRACT | Status: AC
  Administered 2024-03-22: 9 mL via RESPIRATORY_TRACT
  Filled 2024-03-22: qty 9

## 2024-03-22 MED ORDER — METOPROLOL TARTRATE 50 MG PO TABS
50.0000 mg | ORAL_TABLET | Freq: Two times a day (BID) | ORAL | Status: DC
  Administered 2024-03-22 – 2024-03-25 (×6): 50 mg via ORAL
  Filled 2024-03-22 (×5): qty 1
  Filled 2024-03-22: qty 2

## 2024-03-22 MED ORDER — DOXYCYCLINE HYCLATE 100 MG PO TABS
100.0000 mg | ORAL_TABLET | Freq: Two times a day (BID) | ORAL | Status: DC
  Administered 2024-03-22 – 2024-03-25 (×6): 100 mg via ORAL
  Filled 2024-03-22 (×7): qty 1

## 2024-03-22 MED ORDER — ACETAMINOPHEN 325 MG PO TABS
650.0000 mg | ORAL_TABLET | Freq: Four times a day (QID) | ORAL | Status: DC | PRN
  Filled 2024-03-22: qty 2

## 2024-03-22 MED ORDER — METHYLPREDNISOLONE SODIUM SUCC 40 MG IJ SOLR
40.0000 mg | Freq: Two times a day (BID) | INTRAMUSCULAR | Status: DC
  Administered 2024-03-22 – 2024-03-25 (×6): 40 mg via INTRAVENOUS
  Filled 2024-03-22 (×7): qty 1

## 2024-03-22 MED ORDER — ISOSORBIDE MONONITRATE ER 30 MG PO TB24
30.0000 mg | ORAL_TABLET | Freq: Every day | ORAL | Status: DC
  Administered 2024-03-23 – 2024-03-25 (×3): 30 mg via ORAL
  Filled 2024-03-22 (×3): qty 1

## 2024-03-22 MED ORDER — POTASSIUM CHLORIDE CRYS ER 20 MEQ PO TBCR
20.0000 meq | EXTENDED_RELEASE_TABLET | Freq: Every day | ORAL | Status: DC
  Administered 2024-03-23 – 2024-03-25 (×3): 20 meq via ORAL
  Filled 2024-03-22 (×3): qty 1

## 2024-03-22 MED ORDER — IPRATROPIUM-ALBUTEROL 0.5-2.5 (3) MG/3ML IN SOLN
3.0000 mL | RESPIRATORY_TRACT | Status: DC
  Administered 2024-03-22 – 2024-03-24 (×11): 3 mL via RESPIRATORY_TRACT
  Filled 2024-03-22 (×11): qty 3

## 2024-03-22 NOTE — ED Triage Notes (Signed)
 Pt presents to ED via [facility] from home with SOB x3 days with CP yesterday. Hx CHF, COPD.  Tachypnea, tripoding. Given duoneb by EMS with no relief. Also been using home nebs with no relief. Started on CPAP, sat 91%.  Nitroglycerin  0.4 SL given.  No edema. CP subsided with oxygen .  Albuterol  5mg , solumedrol 125.  Hx kidney transplant.  BP 144/74, pulse 103, oxygen  sat 901-92%, Cap 30.  Wears home oxygen  at 6 LPM via [place].

## 2024-03-22 NOTE — Progress Notes (Signed)
 Transported patient to [phone] on NIV with RN, no complications.   Tilley, Serenidy Waltz

## 2024-03-22 NOTE — H&P (Signed)
 History and Physical    JHANAE RATHBUN Ward:[id number] DOB: Mar 27, 1941 DOA: 03/22/2024  Patient coming from: Home.  Chief Complaint: Shortness of breath.  HPI: [name] is a 83 y.o. female with history of CAD status post CABG, hypertension, asthma/COPD follows with pulmonologist at [facility], renal transplant on immunosuppressants presents to the ER with worsening shortness of breath for the last 3 days.  Patient states she has been short of breath even at rest with some productive cough and wheezing.  Has had chest tightness due to shortness of breath.  She has been using her inhalers at home despite which patient was getting short of breath.  Denies gaining weight.  ED Course: In the ER patient was hypoxic requiring BiPAP.  Venous blood gas shows pH of 7.33 PCO245.  Chest x-ray shows pulmonary edema.  BNP was 705 troponins are flat at 19 and 23.  EKG is pending.  Patient was placed on nebulizer therapy steroids and 1 dose of Lasix  40 mg IV was given for respiratory failure with hypoxia probably a combination of COPD/asthma and CHF.  Review of Systems: As per HPI, rest all negative.   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, hold ASA 2-3 weeks - consider plavix instead of ASA   Asthma    Cellulitis 12/19/2019   right upper arm   Coronary artery disease    cath 11/2007, grafts patent /  Nuclear, June, 2011, prior inferior MI with mild peri-infarct ischemia, anterior breast attenuation, EF 67%, done for renal transplant assessment / Low level exercise/Lexiscan  Myoview  (11/2013): EF 67%, no ischemi; normal study   Diabetes mellitus    type 2   ESRD on dialysis [facility])    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, 2012   Family history of breast cancer    Family history of prostate cancer    GERD (gastroesophageal reflux disease)    GI bleed 11/26/2010   January, 2012 , AVM   Gout    History of methicillin resistant staphylococcus aureus  (MRSA)    Hyperlipidemia    Hyperparathyroidism    Hypertension    Myocardial infarction [facility])    Osteoporosis    Pneumonia 08/2010    Hospitalization, October, 2011   Primary osteoarthritis, 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   S/P kidney transplant    September, 2012, [facility]   Subacromial impingement of 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    Past Surgical History:  Procedure Laterality Date   ABDOMINAL HYSTERECTOMY  1980'S   ARTERIOVENOUS GRAFT PLACEMENT Left 03/04/2007   forearm   BIOPSY  09/30/2019   Procedure: BIOPSY;  Surgeon: Baldo Bonds, MD;  Location: [facility];  Service: Endoscopy;;   BIOPSY  12/27/2021   Procedure: BIOPSY;  Surgeon: Baldo Bonds, MD;  Location: [facility];  Service: Endoscopy;;   BREAST EXCISIONAL BIOPSY Right    benign more than 10 yr ago   BREAST SURGERY  YRS AGO   RT BREAST CYST REMOVED    CARDIAC CATHETERIZATION  06/03/2002; 12/04/2007   COLONOSCOPY WITH PROPOFOL  N/A 07/14/2014   Procedure: COLONOSCOPY WITH PROPOFOL ;  Surgeon: Yvetta Herbert, MD;  Location: [facility];  Service: Endoscopy;  Laterality: N/A;   COLONOSCOPY WITH PROPOFOL  N/A 09/30/2019   Procedure: COLONOSCOPY WITH PROPOFOL ;  Surgeon: Baldo Bonds, MD;  Location: [facility];  Service: Endoscopy;  Laterality: N/A;   COLONOSCOPY WITH PROPOFOL  N/A 12/27/2021   Procedure: COLONOSCOPY WITH PROPOFOL ;  Surgeon: Baldo Bonds, MD;  Location: [facility];  Service: Endoscopy;  Laterality: N/A;   CORONARY ARTERY BYPASS GRAFT  06/06/2002   x 4   ESOPHAGOGASTRODUODENOSCOPY N/A 12/27/2021   Procedure: ESOPHAGOGASTRODUODENOSCOPY (EGD);  Surgeon: Baldo Bonds, MD;  Location: [facility];  Service: Endoscopy;  Laterality: N/A;   HEMOSTASIS CLIP PLACEMENT  12/27/2021   Procedure: HEMOSTASIS CLIP PLACEMENT;  Surgeon: Baldo Bonds, MD;  Location: [facility];  Service: Endoscopy;;   HOT HEMOSTASIS N/A 07/14/2014   Procedure: HOT HEMOSTASIS (ARGON PLASMA COAGULATION/BICAP);  Surgeon:  Yvetta Herbert, MD;  Location: [facility];  Service: Endoscopy;  Laterality: N/A;   IR FLUORO GUIDE CV LINE RIGHT  12/22/2019   IR US  GUIDE VASC ACCESS RIGHT  12/22/2019   KIDNEY TRANSPLANT Right 08/04/2011   ORIF PATELLA Left 04/06/2016   Procedure: OPEN REDUCTION INTERNAL (ORIF) LEFT  PATELLA;  Surgeon: Ferd Householder, MD;  Location: [facility];  Service: Orthopedics;  Laterality: Left;   POLYPECTOMY  09/30/2019   Procedure: POLYPECTOMY;  Surgeon: Baldo Bonds, MD;  Location: [facility];  Service: Endoscopy;;   POLYPECTOMY  12/27/2021   Procedure: POLYPECTOMY;  Surgeon: Baldo Bonds, MD;  Location: [facility];  Service: Endoscopy;;   SHOULDER ARTHROSCOPY WITH ROTATOR CUFF REPAIR AND SUBACROMIAL DECOMPRESSION Left 08/03/2016   Procedure: LEFT SHOULDER ARTHROSCOPY WITH ROTATOR CUFF REPAIR AND SUBACROMIAL DECOMPRESSION with distal claviculectomy and extentsive debridement;  Surgeon: Ferd Householder, MD;  Location: [facility];  Service: Orthopedics;  Laterality: Left;  Block   THROMBECTOMY AND REVISION OF ARTERIOVENTOUS (AV) GORETEX  GRAFT Left 05/08/2007   forearm   TOTAL HIP ARTHROPLASTY  12/18/2012   Procedure: TOTAL HIP ARTHROPLASTY;  Surgeon: Ferd Householder, MD;  Location: [facility] OR;  Service: Orthopedics;  Laterality: Left;   VESICOVAGINAL FISTULA CLOSURE W/ TAH  1984     reports that she quit smoking about 40 years ago. Her smoking use included cigarettes. She started smoking about 48 years ago. She has a 8 pack-year smoking history. She has never used smokeless tobacco. She reports that she does not drink alcohol and does not use drugs.  Allergies  Allergen Reactions   Sodium Hypochlorite Shortness Of Breath   Lactose Nausea And Vomiting   Asa Arthritis Strength-Antacid [Aspirin  Buffered] Nausea And Vomiting and Other (See Comments)    STOMACH BURNS, also   Aspirin  Nausea And Vomiting    Per pt. "can tolerate the enteric coated tablets".     Atorvastatin Other (See Comments)    Patient's skin was skin was sensitive   Banana Nausea And Vomiting   Lactose Intolerance (Gi) Nausea And Vomiting   Lisinopril Cough    Family History  Problem Relation Age of Onset   Prostate cancer Brother    Lung cancer Brother    Cancer Neg Hx     Prior to Admission medications   Medication Sig Start Date End Date Taking? Authorizing Provider  acetaminophen  (TYLENOL ) 650 MG CR tablet Take 650 mg by mouth every morning.   Yes [provider]  albuterol  (PROVENTIL ) (2.5 MG/3ML) 0.083% nebulizer solution Take 2.5 mg by nebulization every 6 (six) hours as needed for wheezing or shortness of breath.   Yes [provider]  albuterol  (VENTOLIN  HFA) 108 (90 Base) MCG/ACT inhaler Inhale 1-2 puffs into the lungs every 6 (six) hours as needed for wheezing or shortness of breath.   Yes [provider]  amLODipine  (NORVASC ) 10 MG tablet Take 10 mg by mouth daily.    Yes [provider]  aspirin  EC 81 MG tablet Take 81 mg by mouth daily.   Yes [provider]  Azelastine  HCl 0.15 % SOLN Place 1 spray into both nostrils daily as needed for allergies.   Yes [provider]  budesonide (PULMICORT) 0.25 MG/2ML nebulizer solution Inhale 0.25 mg into the lungs as needed. 02/11/20  Yes [provider]  calcitRIOL  (ROCALTROL ) 0.25 MCG capsule Take 0.25 mcg by mouth daily.    Yes [provider]  ezetimibe  (ZETIA ) 10 MG tablet TAKE 1 TABLET BY MOUTH EVERY DAY 01/16/24  Yes Nahser, Lela Purple, MD  fluticasone  (FLONASE ) 50 MCG/ACT nasal spray Place into both nostrils. 05/30/21  Yes [provider]  Fluticasone -Umeclidin-Vilant (TRELEGY ELLIPTA) 200-62.5-25 MCG/ACT AEPB Inhale 1 puff into the lungs daily.   Yes [provider]  isosorbide  mononitrate (IMDUR ) 30 MG 24 hr tablet TAKE 1 TABLET (30 MG TOTAL) BY MOUTH DAILY. PLEASE KEEP SCHEDULE APPOINTMENT FOR FUTURE REFILLS 01/16/24  Yes Marlyse Single T, PA-C  KLOR-CON  M20 20 MEQ tablet TAKE 1 TABLET BY MOUTH EVERY DAY 11/22/23  Yes Nahser, Lela Purple, MD  metoprolol  tartrate (LOPRESSOR ) 50 MG tablet TAKE 1 TABLET BY MOUTH TWICE A DAY 07/24/23  Yes Nahser, Lela Purple, MD  nitroGLYCERIN  (NITROSTAT ) 0.4 MG SL tablet Place 1 tablet (0.4 mg total) under the tongue every 5 (five) minutes as needed for chest pain. 10/03/22  Yes Nahser, Lela Purple, MD  omeprazole (PRILOSEC) 20 MG capsule Take 20 mg by mouth daily after breakfast.   Yes [provider]  predniSONE  (DELTASONE ) 5 MG tablet Take 5 mg by mouth 2 (two) times daily.   Yes [provider]  tacrolimus  (PROGRAF ) 1 MG capsule Take 4 mg by mouth See admin instructions. Take 4 mg by mouth at 9 AM and 4 mg at 9 PM   Yes [provider]  Tiotropium Bromide  Monohydrate 1.25 MCG/ACT AERS Inhale 1 puff into the lungs daily. 10/23/17  Yes [provider]  ONETOUCH VERIO test strip 1 EACH BY OTHER ROUTE DAILY IN THE AFTERNOON. USE AS INSTRUCTED 07/02/23   Shamleffer, Ibtehal Jaralla, MD  predniSONE  (DELTASONE ) 20 MG tablet Take 2 tablets (40 mg total) by mouth daily with breakfast. Patient not taking: Reported on 03/22/2024 08/09/23   Carleen Chary, DO    Physical Exam: Constitutional: Moderately built and nourished. Vitals:   03/22/24 1800 03/22/24 1821 03/22/24 1940 03/22/24 2015  BP: 139/67 (!) 149/61  (!) 146/81  Pulse: 85 98  (!) 115  Resp: 19 (!) 23  (!) 35  Temp:      TempSrc:      SpO2: 100% 100% 94% 100%  Weight:      Height:       Eyes: Anicteric no pallor. ENMT: No discharge from the ears eyes nose or mouth. Neck: No mass felt.  No neck rigidity JVD elevated. Respiratory: Air exchange appears minimal bilaterally. Cardiovascular: S1-S2 heard. Abdomen: Soft nontender bowel sound present. Musculoskeletal: No edema. Skin: No rash. Neurologic: Alert awake oriented to time place and person.  Moves all extremities. Psychiatric: Appears normal.  Normal  affect.   Labs on Admission: I have personally reviewed following labs and imaging studies  CBC: Recent Labs  Lab 03/22/24 1610  WBC 5.5  NEUTROABS 4.0  HGB 12.5  HCT 37.0  MCV 72.0*  PLT 125*   Basic Metabolic Panel: Recent Labs  Lab 03/22/24 1610  NA 139  K 4.2  CL 105  CO2 21*  GLUCOSE  171*  BUN 25*  CREATININE 1.60*  CALCIUM  9.1   GFR: Estimated Creatinine Clearance: 22.8 mL/min (A) (by C-G formula based on SCr of 1.6 mg/dL (H)). Liver Function Tests: Recent Labs  Lab 03/22/24 1610  AST 23  ALT 17  ALKPHOS 45  BILITOT 0.9  PROT 6.3*  ALBUMIN 3.5   No results for input(s): "LIPASE", "AMYLASE" in the last 168 hours. No results for input(s): "AMMONIA" in the last 168 hours. Coagulation Profile: No results for input(s): "INR", "PROTIME" in the last 168 hours. Cardiac Enzymes: No results for input(s): "CKTOTAL", "CKMB", "CKMBINDEX", "TROPONINI" in the last 168 hours. BNP (last 3 results) No results for input(s): "PROBNP" in the last 8760 hours. HbA1C: No results for input(s): "HGBA1C" in the last 72 hours. CBG: No results for input(s): "GLUCAP" in the last 168 hours. Lipid Profile: No results for input(s): "CHOL", "HDL", "LDLCALC", "TRIG", "CHOLHDL", "LDLDIRECT" in the last 72 hours. Thyroid  Function Tests: No results for input(s): "TSH", "T4TOTAL", "FREET4", "T3FREE", "THYROIDAB" in the last 72 hours. Anemia Panel: No results for input(s): "VITAMINB12", "FOLATE", "FERRITIN", "TIBC", "IRON", "RETICCTPCT" in the last 72 hours. Urine analysis:    Component Value Date/Time   COLORURINE STRAW (A) 08/07/2023 0912   APPEARANCEUR CLEAR 08/07/2023 0912   LABSPEC 1.013 08/07/2023 0912   PHURINE 5.0 08/07/2023 0912   GLUCOSEU NEGATIVE 08/07/2023 0912   GLUCOSEU NEGATIVE 06/21/2012 0819   HGBUR SMALL (A) 08/07/2023 0912   BILIRUBINUR NEGATIVE 08/07/2023 0912   KETONESUR NEGATIVE 08/07/2023 0912   PROTEINUR 100 (A) 08/07/2023 0912   UROBILINOGEN 0.2  12/10/2012 1056   NITRITE NEGATIVE 08/07/2023 0912   LEUKOCYTESUR NEGATIVE 08/07/2023 0912   Sepsis Labs: @LABRCNTIP (procalcitonin:4,lacticidven:4) )No results found for this or any previous visit (from the past 240 hours).   Radiological Exams on Admission: DG Chest Portable 1 View Result Date: 03/22/2024 CLINICAL DATA:  Dyspnea. EXAM: PORTABLE CHEST 1 VIEW COMPARISON:  08/05/2023 and 12/18/2019. FINDINGS: Borderline enlarged heart with an interval decrease in size. Tortuous and partially calcified thoracic aorta. Post CABG changes. Stable mild interstitial prominence with Kerley lines, not present on 12/18/2019. Minimal patchy density at the left lung base with improvement. No pleural fluid. Unremarkable bones. IMPRESSION: 1. Mild congestive heart failure. 2. Minimal left basilar atelectasis or alveolar edema with improvement. Electronically Signed   By: Catherin Closs M.D.   On: 03/22/2024 17:03     Assessment/Plan Principal Problem:   Acute respiratory failure with hypoxia (HCC) Active Problems:   GOUT   THROMBOCYTOPENIA   Hypertension   S/P kidney transplant   Severe persistent asthma with acute exacerbation   COPD exacerbation (HCC)   CKD (chronic kidney disease) stage 3, GFR 30-59 ml/min (HCC)    Acute respiratory failure with hypoxia presently on BiPAP likely a combination of COPD/asthma exacerbation with CHF.  Patient did receive 1 dose of Lasix  40 mg IV will decide further doses based on the response.  Will continue with scheduled and as needed DuoNebs and IV Solu-Medrol  and doxycycline .  Check 2D echo.  Trend cardiac markers.  Follow intake output metabolic panel.  Last 2D echo done in June 2024 showed EF of 55 to 60%. CAD status post CABG has some chest tightness suspect related to COPD.Aaron Aas  Troponins are flat we are going to trend cardiac markers.  EKG is pending.  Continue with metoprolol  Imdur  aspirin .  Check 2D echo. Renal transplant with chronic kidney disease stage III  creatinine at around baseline when compared to the one in November 2024.  Patient did receive 1 dose of Lasix .  Will check Prograf  level since cardiology notes previously indicated Prograf  may be causing shortness of breath.  Patient takes prednisone  presently on IV Solu-Medrol . Hypertension on amlodipine  Imdur  and metoprolol . Diabetes mellitus type 2 last hemoglobin A1c 5 months ago was 6.1.  Presently not on any hypoglycemics.  Will keep patient on sliding scale coverage. Thrombocytopenia appears to be chronic. GERD on PPI.  Since patient has respiratory failure with hypoxia required BiPAP will need close monitoring and more than 2 midnight stay.   DVT prophylaxis: Heparin . Code Status: Full code. Family Communication: Discussed with patient. Disposition Plan: Progressive care. Consults called: None. Admission status: Inpatient.

## 2024-03-22 NOTE — Progress Notes (Signed)
 Pt taken off BIPAP per MD request for trial off. Placed pt on 6L [place] per her home oxygen  regimen. Sat 94%, RR 25, Pt states that she feels more air coming in but still appears SOB to me. I instructed the patient to call out if she needs the BIPAP back on. Call bell within reach.   Tilley, Adalyna Godbee

## 2024-03-22 NOTE — Progress Notes (Signed)
 Pt called out SOB asking for BIPAP, placed back on and pt feeling better. Will cont to monitor

## 2024-03-22 NOTE — ED Provider Notes (Signed)
 [facility] AT [facility] Provider Note  CSN: [id number] Arrival date & time: 03/22/24 1554  Chief Complaint(s) Respiratory Distress  HPI [name] is a 83 y.o. female with PMH COPD on 2 L baseline, 4 L when ambulating, ESRD s/p transplant in 2012 on immunosuppression, CAD, CHF who presents emergency department for evaluation of shortness of breath.  States that symptoms have been gradually worsened over the last 3 days.  Found to be tachypneic and tripoding by EMS.  Given 1 DuoNeb, started on CPAP, given 0.4 mg sublingual nitro prior to arrival and patient feels significantly improved.  Patient arrives with persistent respiratory distress but is able to answer questions and is alert and oriented.  Endorses chest tightness and some mild chest pain but denies pain, nausea, vomiting or other systemic symptoms.   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, hold ASA 2-3 weeks - consider plavix instead of ASA   Asthma    Cellulitis 12/19/2019   right upper arm   Coronary artery disease    cath 11/2007, grafts patent /  Nuclear, June, 2011, prior inferior MI with mild peri-infarct ischemia, anterior breast attenuation, EF 67%, done for renal transplant assessment / Low level exercise/Lexiscan  Myoview  (11/2013): EF 67%, no ischemi; normal study   Diabetes mellitus    type 2   ESRD on dialysis [facility])    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, 2012   Family history of breast cancer    Family history of prostate cancer    GERD (gastroesophageal reflux disease)    GI bleed 11/26/2010   January, 2012 , AVM   Gout    History of methicillin resistant staphylococcus aureus (MRSA)    Hyperlipidemia    Hyperparathyroidism    Hypertension    Myocardial infarction [facility])    Osteoporosis    Pneumonia 08/2010    Hospitalization, October, 2011   Primary osteoarthritis, 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    S/P kidney transplant    September, 2012, [facility]   Subacromial impingement of 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   Patient Active Problem List   Diagnosis Date Noted   Transplanted kidney 08/08/2023   AKI (acute kidney injury) (HCC) 08/07/2023   Kidney transplant recipient 08/07/2023   COPD exacerbation (HCC) 08/06/2023   Acute on chronic hypoxic respiratory failure (HCC) 08/05/2023   History of renal transplantation 07/10/2022   Asymptomatic menopausal state 11/03/2021   Olecranon bursitis of right elbow 12/26/2019   Right arm cellulitis 12/18/2019   Fever    Hx of colonic polyps 09/30/2019   Family history of breast cancer    Family history of prostate cancer    Family history of lung cancer 08/08/2018   Shoulder pain, right 04/04/2018   Severe persistent asthma with (acute) exacerbation 02/13/2018   GERD (gastroesophageal reflux disease) 02/12/2018   Severe persistent asthma with acute exacerbation 02/12/2018   Complete rotator cuff tear of 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   Subacromial impingement of 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   Primary osteoarthritis, 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   History of methicillin resistant staphylococcus aureus (MRSA)    Benign neoplasm of colon 07/14/2014   Nausea & vomiting 12/22/2013   Dyspnea 12/22/2013   Diabetes (HCC) 02/28/2013   Preop cardiovascular exam    Type II or unspecified type diabetes mellitus without mention of complication, not stated as uncontrolled 06/05/2012   Encounter for long-term (current) use of other medications 02/26/2012  Type II or unspecified type diabetes mellitus with renal manifestations, not stated as uncontrolled(250.40) 02/26/2012   S/P kidney transplant    H/O steroid therapy    Anemia    Coronary artery disease    Hyperlipidemia    Hypertension    Aspirin  allergy    Hx of CABG    Ejection fraction    Anemia    GI bleed 11/26/2010   Pneumonia 08/20/2010   FOOT PAIN 01/20/2010   THROMBOCYTOPENIA 01/11/2009   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 Authorizing Provider  acetaminophen  (TYLENOL ) 650 MG CR tablet Take 650 mg by mouth every morning.    [provider]  albuterol  (PROVENTIL ) (2.5 MG/3ML) 0.083% nebulizer solution Take 2.5 mg by nebulization every 6 (six) hours as needed for wheezing or shortness of breath.    [provider]  albuterol  (VENTOLIN  HFA) 108 (90 Base) MCG/ACT inhaler Inhale 1-2 puffs into the lungs every 6 (six) hours as needed for wheezing or shortness of breath.    [provider]  amLODipine  (NORVASC ) 10 MG tablet Take 10 mg by mouth daily.     [provider]  aspirin  EC 81 MG tablet Take 81 mg by mouth daily.    [provider]  Azelastine  HCl 0.15 % SOLN Place 1 spray into both nostrils daily as needed for allergies.    [provider]  budesonide (PULMICORT) 0.25 MG/2ML nebulizer solution Inhale 0.25 mg into the lungs as needed. 02/11/20   [provider]  calcitRIOL  (ROCALTROL ) 0.25 MCG capsule Take 0.25 mcg by mouth daily.     [provider]  ezetimibe  (ZETIA ) 10 MG tablet TAKE 1 TABLET BY MOUTH EVERY DAY 01/16/24   Nahser, Lela Purple, MD  fluticasone  (FLONASE ) 50 MCG/ACT nasal spray Place into both nostrils. 05/30/21   [provider]  fluticasone  furoate-vilanterol (BREO ELLIPTA ) 200-25 MCG/INH AEPB Inhale 1 puff into the lungs daily as needed (respiratory issues.). 10/23/17   [provider]  Fluticasone -Umeclidin-Vilant (TRELEGY ELLIPTA) 200-62.5-25 MCG/ACT AEPB Inhale 1 puff into the lungs daily.    [provider]  isosorbide  mononitrate (IMDUR ) 30 MG 24 hr tablet TAKE 1 TABLET (30 MG TOTAL) BY MOUTH DAILY. PLEASE KEEP SCHEDULE  APPOINTMENT FOR FUTURE REFILLS 01/16/24   Marlyse Single T, PA-C  KLOR-CON  M20 20 MEQ tablet TAKE 1 TABLET BY MOUTH EVERY DAY 11/22/23   Nahser, Lela Purple, MD  metoprolol  tartrate (LOPRESSOR ) 50 MG tablet TAKE 1 TABLET BY MOUTH TWICE A DAY 07/24/23   Nahser, Lela Purple, MD  nitroGLYCERIN  (NITROSTAT ) 0.4 MG SL tablet Place 1 tablet (0.4 mg total) under the tongue every 5 (five) minutes as needed for chest pain. 10/03/22   Nahser, Lela Purple, MD  omeprazole (PRILOSEC) 20 MG capsule Take 20 mg by mouth daily after breakfast.    [provider]  ONETOUCH VERIO test strip 1 EACH BY OTHER ROUTE DAILY IN THE AFTERNOON. USE AS INSTRUCTED 07/02/23   Shamleffer, Ibtehal Jaralla, MD  predniSONE  (DELTASONE ) 20 MG tablet Take 2 tablets (40 mg total) by mouth daily with breakfast. 08/09/23   Carleen Chary, DO  predniSONE  (DELTASONE ) 5 MG tablet Take 5 mg by mouth 2 (two) times daily.    [provider]  sennosides-docusate sodium  (SENOKOT-S) 8.6-50  MG tablet Take 1 tablet by mouth daily as needed (constipation.).     [provider]  tacrolimus  (PROGRAF ) 1 MG capsule Take 4 mg by mouth See admin instructions. Take 4 mg by mouth at 9 AM and 4 mg at 9 PM    [provider]  Tiotropium Bromide  Monohydrate 1.25 MCG/ACT AERS Inhale 1 puff into the lungs daily. 10/23/17   [provider]                                                                                                                                    Past Surgical History Past Surgical History:  Procedure Laterality Date   ABDOMINAL HYSTERECTOMY  1980'S   ARTERIOVENOUS GRAFT PLACEMENT Left 03/04/2007   forearm   BIOPSY  09/30/2019   Procedure: BIOPSY;  Surgeon: Baldo Bonds, MD;  Location: [facility];  Service: Endoscopy;;   BIOPSY  12/27/2021   Procedure: BIOPSY;  Surgeon: Baldo Bonds, MD;  Location: [facility];  Service: Endoscopy;;   BREAST EXCISIONAL BIOPSY Right    benign more than 10 yr ago    BREAST SURGERY  YRS AGO   RT BREAST CYST REMOVED    CARDIAC CATHETERIZATION  06/03/2002; 12/04/2007   COLONOSCOPY WITH PROPOFOL  N/A 07/14/2014   Procedure: COLONOSCOPY WITH PROPOFOL ;  Surgeon: Yvetta Herbert, MD;  Location: [facility];  Service: Endoscopy;  Laterality: N/A;   COLONOSCOPY WITH PROPOFOL  N/A 09/30/2019   Procedure: COLONOSCOPY WITH PROPOFOL ;  Surgeon: Baldo Bonds, MD;  Location: [facility];  Service: Endoscopy;  Laterality: N/A;   COLONOSCOPY WITH PROPOFOL  N/A 12/27/2021   Procedure: COLONOSCOPY WITH PROPOFOL ;  Surgeon: Baldo Bonds, MD;  Location: [facility];  Service: Endoscopy;  Laterality: N/A;   CORONARY ARTERY BYPASS GRAFT  06/06/2002   x 4   ESOPHAGOGASTRODUODENOSCOPY N/A 12/27/2021   Procedure: ESOPHAGOGASTRODUODENOSCOPY (EGD);  Surgeon: Baldo Bonds, MD;  Location: [facility];  Service: Endoscopy;  Laterality: N/A;   HEMOSTASIS CLIP PLACEMENT  12/27/2021   Procedure: HEMOSTASIS CLIP PLACEMENT;  Surgeon: Baldo Bonds, MD;  Location: [facility];  Service: Endoscopy;;   HOT HEMOSTASIS N/A 07/14/2014   Procedure: HOT HEMOSTASIS (ARGON PLASMA COAGULATION/BICAP);  Surgeon: Yvetta Herbert, MD;  Location: [facility];  Service: Endoscopy;  Laterality: N/A;   IR FLUORO GUIDE CV LINE RIGHT  12/22/2019   IR US  GUIDE VASC ACCESS RIGHT  12/22/2019   KIDNEY TRANSPLANT Right 08/04/2011   ORIF PATELLA Left 04/06/2016   Procedure: OPEN REDUCTION INTERNAL (ORIF) LEFT  PATELLA;  Surgeon: Ferd Householder, MD;  Location: [facility];  Service: Orthopedics;  Laterality: Left;   POLYPECTOMY  09/30/2019   Procedure: POLYPECTOMY;  Surgeon: Baldo Bonds, MD;  Location: [facility];  Service: Endoscopy;;   POLYPECTOMY  12/27/2021   Procedure: POLYPECTOMY;  Surgeon: Baldo Bonds, MD;  Location: [facility];  Service: Endoscopy;;   SHOULDER ARTHROSCOPY WITH ROTATOR CUFF REPAIR AND SUBACROMIAL DECOMPRESSION Left 08/03/2016  Procedure: LEFT  SHOULDER ARTHROSCOPY WITH ROTATOR CUFF REPAIR AND SUBACROMIAL DECOMPRESSION with distal claviculectomy and extentsive debridement;  Surgeon: Ferd Householder, MD;  Location: [facility];  Service: Orthopedics;  Laterality: Left;  Block   THROMBECTOMY AND REVISION OF ARTERIOVENTOUS (AV) GORETEX  GRAFT Left 05/08/2007   forearm   TOTAL HIP ARTHROPLASTY  12/18/2012   Procedure: TOTAL HIP ARTHROPLASTY;  Surgeon: Ferd Householder, MD;  Location: [facility] OR;  Service: Orthopedics;  Laterality: Left;   VESICOVAGINAL FISTULA CLOSURE W/ TAH  1984   Family History Family History  Problem Relation Age of Onset   Prostate cancer Brother    Lung cancer Brother    Cancer Neg Hx     Social History Social History   Tobacco Use   Smoking status: Former    Current packs/day: 0.00    Average packs/day: 1 pack/day for 8.0 years (8.0 ttl pk-yrs)    Types: Cigarettes    Start date: 11/21/1975    Quit date: 11/21/1983    Years since quitting: 40.3   Smokeless tobacco: Never  Vaping Use   Vaping status: Never Used  Substance Use Topics   Alcohol use: No   Drug use: No   Allergies Sodium hypochlorite, Lactose, Asa arthritis strength-antacid [aspirin  buffered], Aspirin , Atorvastatin, Banana, Lactose intolerance (gi), and Lisinopril  Review of Systems Review of Systems  Respiratory:  Positive for chest tightness and shortness of breath.   Cardiovascular:  Positive for chest pain.    Physical Exam Vital Signs  I have reviewed the triage vital signs BP (!) 149/61   Pulse 98   Temp 97.6 F (36.4 C) (Axillary)   Resp (!) 23   Ht 5\' 3"  (1.6 m)   Wt 54.9 kg   LMP 03/04/2022   SpO2 100%   BMI 21.43 kg/m   Physical Exam Vitals and nursing note reviewed.  Constitutional:      General: She is in acute distress.     Appearance: She is well-developed.  HENT:     Head: Normocephalic and atraumatic.  Eyes:     Conjunctiva/sclera: Conjunctivae normal.  Cardiovascular:     Rate and Rhythm:  Normal rate and regular rhythm.     Heart sounds: No murmur heard. Pulmonary:     Effort: Pulmonary effort is normal. No respiratory distress.     Breath sounds: Rales present.  Abdominal:     Palpations: Abdomen is soft.     Tenderness: There is no abdominal tenderness.  Musculoskeletal:        General: No swelling.     Cervical back: Neck supple.  Skin:    General: Skin is warm and dry.     Capillary Refill: Capillary refill takes less than 2 seconds.  Neurological:     Mental Status: She is alert.  Psychiatric:        Mood and Affect: Mood normal.     ED Results and Treatments Labs (all labs ordered are listed, but only abnormal results are displayed) Labs Reviewed  COMPREHENSIVE METABOLIC PANEL WITH GFR - Abnormal; Notable for the following components:      Result Value   CO2 21 (*)    Glucose, Bld 171 (*)    BUN 25 (*)    Creatinine, Ser 1.60 (*)    Total Protein 6.3 (*)    GFR, Estimated 32 (*)    All other components within normal limits  CBC WITH DIFFERENTIAL/PLATELET - Abnormal; Notable for the following components:  RBC 5.14 (*)    MCV 72.0 (*)    MCH 24.3 (*)    RDW 18.6 (*)    Platelets 125 (*)    All other components within normal limits  BRAIN NATRIURETIC PEPTIDE - Abnormal; Notable for the following components:   B Natriuretic Peptide 785.2 (*)    All other components within normal limits  BLOOD GAS, VENOUS - Abnormal; Notable for the following components:   pO2, Ven 50 (*)    Acid-base deficit 2.4 (*)    All other components within normal limits  TROPONIN I (HIGH SENSITIVITY) - Abnormal; Notable for the following components:   Troponin I (High Sensitivity) 19 (*)    All other components within normal limits  TROPONIN I (HIGH SENSITIVITY)                                                                                                                          Radiology DG Chest Portable 1 View Result Date: 03/22/2024 CLINICAL DATA:  Dyspnea. EXAM:  PORTABLE CHEST 1 VIEW COMPARISON:  08/05/2023 and 12/18/2019. FINDINGS: Borderline enlarged heart with an interval decrease in size. Tortuous and partially calcified thoracic aorta. Post CABG changes. Stable mild interstitial prominence with Kerley lines, not present on 12/18/2019. Minimal patchy density at the left lung base with improvement. No pleural fluid. Unremarkable bones. IMPRESSION: 1. Mild congestive heart failure. 2. Minimal left basilar atelectasis or alveolar edema with improvement. Electronically Signed   By: Catherin Closs M.D.   On: 03/22/2024 17:03    Pertinent labs & imaging results that were available during my care of the patient were reviewed by me and considered in my medical decision making (see MDM for details).  Medications Ordered in ED Medications  ipratropium-albuterol  (DUONEB) 0.5-2.5 (3) MG/3ML nebulizer solution 9 mL (9 mLs Nebulization Given 03/22/24 1800)  furosemide  (LASIX ) injection 40 mg (40 mg Intravenous Given 03/22/24 1821)                                                                                                                                     Procedures .Critical Care  Performed by: Karlyn Overman, MD Authorized by: Karlyn Overman, MD   Critical care provider statement:    Critical care time (minutes):  30   Critical care was necessary to treat or prevent imminent or life-threatening deterioration of the following conditions:  Respiratory failure  Critical care was time spent personally by me on the following activities:  Development of treatment plan with patient or surrogate, discussions with consultants, evaluation of patient's response to treatment, examination of patient, ordering and review of laboratory studies, ordering and review of radiographic studies, ordering and performing treatments and interventions, pulse oximetry, re-evaluation of patient's condition and review of old charts   (including critical care time)  Medical Decision  Making / ED Course   This patient presents to the ED for concern of shortness of breath, this involves an extensive number of treatment options, and is a complaint that carries with it a high risk of complications and morbidity.  The differential diagnosis includes Pe, PTX, Pulmonary Edema, ARDS, COPD/Asthma, ACS, CHF exacerbation, Arrhythmia, Pericardial Effusion/Tamponade, Anemia, Sepsis, Acidosis/Hypercapnia, Anxiety, Viral URI  MDM: Patient seen emergency room for evaluation of shortness of breath.  Physical exam reveals a patient in respiratory distress with wheezing and rales bilaterally.  Laboratory evaluation with a BNP of 785 oh, high-sensitivity troponin 19, pH 7.3 with no significant hypercarbia, BUN 25, creatinine 1.6.  Chest x-ray with mild CHF.  Patient given 3 DuoNebs with symptomatic improvement but remains on BiPAP.  Lasix  given.  Will require hospital admission for respiratory failure requiring BiPAP   Additional history obtained:  -External records from outside source obtained and reviewed including: Chart review including previous notes, labs, imaging, consultation notes   Lab Tests: -I ordered, reviewed, and interpreted labs.   The pertinent results include:   Labs Reviewed  COMPREHENSIVE METABOLIC PANEL WITH GFR - Abnormal; Notable for the following components:      Result Value   CO2 21 (*)    Glucose, Bld 171 (*)    BUN 25 (*)    Creatinine, Ser 1.60 (*)    Total Protein 6.3 (*)    GFR, Estimated 32 (*)    All other components within normal limits  CBC WITH DIFFERENTIAL/PLATELET - Abnormal; Notable for the following components:   RBC 5.14 (*)    MCV 72.0 (*)    MCH 24.3 (*)    RDW 18.6 (*)    Platelets 125 (*)    All other components within normal limits  BRAIN NATRIURETIC PEPTIDE - Abnormal; Notable for the following components:   B Natriuretic Peptide 785.2 (*)    All other components within normal limits  BLOOD GAS, VENOUS - Abnormal; Notable for the  following components:   pO2, Ven 50 (*)    Acid-base deficit 2.4 (*)    All other components within normal limits  TROPONIN I (HIGH SENSITIVITY) - Abnormal; Notable for the following components:   Troponin I (High Sensitivity) 19 (*)    All other components within normal limits  TROPONIN I (HIGH SENSITIVITY)        Imaging Studies ordered: I ordered imaging studies including chest x-ray I independently visualized and interpreted imaging. I agree with the radiologist interpretation   Medicines ordered and prescription drug management: Meds ordered this encounter  Medications   ipratropium-albuterol  (DUONEB) 0.5-2.5 (3) MG/3ML nebulizer solution 9 mL   furosemide  (LASIX ) injection 40 mg    -I have reviewed the patients home medicines and have made adjustments as needed  Critical interventions BiPAP, DuoNebs, Lasix     Cardiac Monitoring: The patient was maintained on a cardiac monitor.  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, hold ASA 2-3 weeks - consider plavix instead of ASA   Asthma    Cellulitis 12/19/2019   right upper arm   Coronary artery disease    cath 11/2007, grafts patent /  Nuclear, June, 2011, prior inferior MI with mild peri-infarct ischemia, anterior breast attenuation, EF 67%, done for renal transplant assessment / Low level exercise/Lexiscan  Myoview  (11/2013): EF 67%, no ischemi; normal study   Diabetes mellitus    type 2   ESRD on dialysis [facility])    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, 2012   Family history of breast cancer    Family history of  prostate cancer    GERD (gastroesophageal reflux disease)    GI bleed 11/26/2010   January, 2012 , AVM   Gout    History of methicillin resistant staphylococcus aureus (MRSA)    Hyperlipidemia    Hyperparathyroidism    Hypertension    Myocardial infarction [facility])    Osteoporosis    Pneumonia 08/2010    Hospitalization, October, 2011   Primary osteoarthritis, 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   S/P kidney transplant    September, 2012, [facility]   Subacromial impingement of 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      Dispostion: I considered admission for this patient, and patient require hospital mission for respiratory failure requiring BiPAP     Final Clinical Impression(s) / ED Diagnoses Final diagnoses:  COPD exacerbation (HCC)  Acute congestive heart failure, unspecified heart failure type [facility])     @PCDICTATION @    Karlyn Overman, MD 03/22/24 [phone]

## 2024-03-23 DIAGNOSIS — J9601 Acute respiratory failure with hypoxia: Secondary | ICD-10-CM | POA: Diagnosis not present

## 2024-03-23 LAB — COMPREHENSIVE METABOLIC PANEL WITH GFR
ALT: 15 U/L (ref 0–44)
AST: 18 U/L (ref 15–41)
Albumin: 3.2 g/dL — ABNORMAL LOW (ref 3.5–5.0)
Alkaline Phosphatase: 37 U/L — ABNORMAL LOW (ref 38–126)
Anion gap: 12 (ref 5–15)
BUN: 29 mg/dL — ABNORMAL HIGH (ref 8–23)
CO2: 22 mmol/L (ref 22–32)
Calcium: 8.7 mg/dL — ABNORMAL LOW (ref 8.9–10.3)
Chloride: 103 mmol/L (ref 98–111)
Creatinine, Ser: 1.69 mg/dL — ABNORMAL HIGH (ref 0.44–1.00)
GFR, Estimated: 30 mL/min — ABNORMAL LOW (ref >60)
Glucose, Bld: 163 mg/dL — ABNORMAL HIGH (ref 70–99)
Potassium: 4.4 mmol/L (ref 3.5–5.1)
Sodium: 137 mmol/L (ref 135–145)
Total Bilirubin: 0.8 mg/dL (ref 0.0–1.2)
Total Protein: 5.6 g/dL — ABNORMAL LOW (ref 6.5–8.1)

## 2024-03-23 LAB — CBC
HCT: 35.2 % — ABNORMAL LOW (ref 36.0–46.0)
Hemoglobin: 11.8 g/dL — ABNORMAL LOW (ref 12.0–15.0)
MCH: 23.7 pg — ABNORMAL LOW (ref 26.0–34.0)
MCHC: 33.5 g/dL (ref 30.0–36.0)
MCV: 70.8 fL — ABNORMAL LOW (ref 80.0–100.0)
Platelets: 118 10*3/uL — ABNORMAL LOW (ref 150–400)
RBC: 4.97 MIL/uL (ref 3.87–5.11)
RDW: 18.4 % — ABNORMAL HIGH (ref 11.5–15.5)
WBC: 2.7 10*3/uL — ABNORMAL LOW (ref 4.0–10.5)
nRBC: 0 % (ref 0.0–0.2)

## 2024-03-23 LAB — GLUCOSE, CAPILLARY
Glucose-Capillary: 192 mg/dL — ABNORMAL HIGH (ref 70–99)
Glucose-Capillary: 144 mg/dL — ABNORMAL HIGH (ref 70–99)
Glucose-Capillary: 176 mg/dL — ABNORMAL HIGH (ref 70–99)
Glucose-Capillary: 169 mg/dL — ABNORMAL HIGH (ref 70–99)

## 2024-03-23 LAB — TROPONIN I (HIGH SENSITIVITY): Troponin I (High Sensitivity): 22 ng/L — ABNORMAL HIGH (ref <18)

## 2024-03-23 MED ORDER — PHENOL 1.4 % MT LIQD
1.0000 | OROMUCOSAL | Status: DC | PRN
  Administered 2024-03-23 – 2024-03-25 (×4): 1 via OROMUCOSAL
  Filled 2024-03-23: qty 177

## 2024-03-23 MED ORDER — GUAIFENESIN ER 600 MG PO TB12
600.0000 mg | ORAL_TABLET | Freq: Two times a day (BID) | ORAL | Status: DC
  Administered 2024-03-23 – 2024-03-25 (×5): 600 mg via ORAL
  Filled 2024-03-23 (×6): qty 1

## 2024-03-23 MED ORDER — FUROSEMIDE 10 MG/ML IJ SOLN
40.0000 mg | Freq: Every day | INTRAMUSCULAR | Status: DC
  Administered 2024-03-23 – 2024-03-24 (×2): 40 mg via INTRAVENOUS
  Filled 2024-03-23 (×2): qty 4

## 2024-03-23 MED ORDER — BUDESONIDE 0.25 MG/2ML IN SUSP
0.2500 mg | Freq: Two times a day (BID) | RESPIRATORY_TRACT | Status: DC
  Administered 2024-03-23 – 2024-03-25 (×4): 0.25 mg via RESPIRATORY_TRACT
  Filled 2024-03-23 (×4): qty 2

## 2024-03-23 MED ORDER — ARFORMOTEROL TARTRATE 15 MCG/2ML IN NEBU
15.0000 ug | INHALATION_SOLUTION | Freq: Two times a day (BID) | RESPIRATORY_TRACT | Status: DC
  Administered 2024-03-23 – 2024-03-25 (×4): 15 ug via RESPIRATORY_TRACT
  Filled 2024-03-23 (×4): qty 2

## 2024-03-23 NOTE — Progress Notes (Addendum)
 PROGRESS NOTE    [name]  ZOX:[id number] DOB: Sep 28, 1941 DOA: 03/22/2024 PCP: Claud Crumb, MD   Brief Narrative: 83 year old with past medical history significant for CAD status post CABG, hypertension, asthma, COPD follow-up with pulmonologist at [facility], status post renal transplant on immunosuppressant presented to the ER complaining of worsening shortness of breath for the last 3 days.  Reports productive cough and wheezing, chest tightness.  In the ED patient required BiPAP for hypoxic respiratory failure.  Chest x-ray showed pulmonary edema, BNP 705.  Troponins flat.  Patient admitted for COPD exacerbation and heart failure exacerbation.   Assessment & Plan:   Principal Problem:   Acute respiratory failure with hypoxia (HCC) Active Problems:   GOUT   THROMBOCYTOPENIA   Hypertension   Hx of CABG   S/P kidney transplant   GERD (gastroesophageal reflux disease)   Severe persistent asthma with acute exacerbation   COPD exacerbation (HCC)   CKD (chronic kidney disease) stage 3, GFR 30-59 ml/min (HCC)   Diabetes mellitus type 2 in nonobese (HCC)   1-Acute hypoxic respiratory failure in the setting of COPD heart failure exacerbation Para - influenza A positive -Required BiPAP in the ED. -Presented with dyspnea, elevated BNP, chest x-ray:Mild congestive heart failure. Minimal left basilar atelectasis or alveolar edema with improvement. 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   Weight as of this encounter: 54.9 kg.   DVT prophylaxis: Heparin  Code Status: DNR limited Family Communication: Disposition Plan:  Status is: Inpatient Remains inpatient appropriate because: Management of respiratory failure    Consultants:  None  Procedures:  Echo  Antimicrobials:    Subjective: She came off of BiPAP an hour ago, she is feeling okay, reports some shortness of breath, cough.  Objective: Vitals:   03/23/24 0426 03/23/24 0537 03/23/24 0723 03/23/24 0724  BP:  (!) 150/63    Pulse: 86   84  Resp:  20    Temp:  98.2 F (36.8 C)    TempSrc:  Axillary    SpO2: 99%  96% 97%  Weight:      Height:        Intake/Output Summary (Last 24 hours) at 03/23/2024 0806 Last data filed at 03/23/2024 0400 Gross per 24 hour  Intake 120 ml  Output 700 ml  Net -580 ml   Filed Weights   03/22/24 1600 03/22/24 2227  Weight: 54.9 kg 54.9 kg    Examination:  General exam: Appears calm and comfortable  Respiratory system: Tachypnea, bilateral crackles Cardiovascular system: S1 & S2 heard, RRR. Positive  JVD Gastrointestinal system: Abdomen is nondistended, soft and nontender. No organomegaly or masses felt. Normal bowel sounds heard. Central nervous  system: Alert and oriented. No focal neurological deficits. Extremities: Symmetric 5 x 5 power.    Data Reviewed: I have personally reviewed following labs and imaging studies  CBC: Recent Labs  Lab 03/22/24 1610 03/23/24 0336  WBC 5.5 2.7*  NEUTROABS 4.0  --   HGB 12.5 11.8*  HCT 37.0 35.2*  MCV 72.0* 70.8*  PLT 125* 118*   Basic Metabolic Panel: Recent Labs   Lab 03/22/24 1610 03/23/24 0336  NA 139 137  K 4.2 4.4  CL 105 103  CO2 21* 22  GLUCOSE 171* 163*  BUN 25* 29*  CREATININE 1.60* 1.69*  CALCIUM  9.1 8.7*   GFR: Estimated Creatinine Clearance: 20.6 mL/min (A) (by C-G formula based on SCr of 1.69 mg/dL (H)). Liver Function Tests: Recent Labs  Lab 03/22/24 1610 03/23/24 0336  AST 23 18  ALT 17 15  ALKPHOS 45 37*  BILITOT 0.9 0.8  PROT 6.3* 5.6*  ALBUMIN 3.5 3.2*   No results for input(s): "LIPASE", "AMYLASE" in the last 168 hours. No results for input(s): "AMMONIA" in the last 168 hours. Coagulation Profile: No results for input(s): "INR", "PROTIME" in the last 168 hours. Cardiac Enzymes: No results for input(s): "CKTOTAL", "CKMB", "CKMBINDEX", "TROPONINI" in the last 168 hours. BNP (last 3 results) No results for input(s): "PROBNP" in the last 8760 hours. HbA1C: No results for input(s): "HGBA1C" in the last 72 hours. CBG: No results for input(s): "GLUCAP" in the last 168 hours. Lipid Profile: No results for input(s): "CHOL", "HDL", "LDLCALC", "TRIG", "CHOLHDL", "LDLDIRECT" in the last 72 hours. Thyroid  Function Tests: Recent Labs    03/22/24 2126  TSH 0.511   Anemia Panel: No results for input(s): "VITAMINB12", "FOLATE", "FERRITIN", "TIBC", "IRON", "RETICCTPCT" in the last 72 hours. Sepsis Labs: No results for input(s): "PROCALCITON", "LATICACIDVEN" in the last 168 hours.  Recent Results (from the past 240 hours)  Respiratory (~20 pathogens) panel by PCR     Status: Abnormal   Collection Time: 03/22/24  9:03 PM   Specimen: Nasopharyngeal Swab; Respiratory  Result Value Ref Range Status   Adenovirus NOT DETECTED NOT DETECTED Final   Coronavirus 229E NOT DETECTED NOT DETECTED Final    Comment: (NOTE) The Coronavirus on the Respiratory Panel, DOES NOT test for the novel  Coronavirus (2019 nCoV)    Coronavirus HKU1 NOT DETECTED NOT DETECTED Final   Coronavirus NL63 NOT DETECTED NOT DETECTED Final    Coronavirus OC43 NOT DETECTED NOT DETECTED Final   Metapneumovirus NOT DETECTED NOT DETECTED Final   Rhinovirus / Enterovirus NOT DETECTED NOT DETECTED Final   Influenza A NOT DETECTED NOT DETECTED Final   Influenza B NOT DETECTED NOT DETECTED Final   Parainfluenza Virus 1 NOT DETECTED NOT DETECTED Final   Parainfluenza Virus 2 DETECTED (A) NOT DETECTED Final   Parainfluenza Virus 3 NOT DETECTED NOT DETECTED Final   Parainfluenza Virus 4 NOT DETECTED NOT DETECTED Final   Respiratory Syncytial Virus NOT DETECTED NOT DETECTED Final   Bordetella pertussis NOT DETECTED NOT DETECTED Final   Bordetella Parapertussis NOT DETECTED NOT DETECTED Final   Chlamydophila pneumoniae NOT DETECTED NOT DETECTED Final   Mycoplasma pneumoniae NOT DETECTED NOT DETECTED Final    Comment: Performed at [facility], [address] [place], [place] [address]         Radiology Studies: DG Chest Portable 1 View Result Date: 03/22/2024 CLINICAL DATA:  Dyspnea. EXAM: PORTABLE CHEST 1 VIEW COMPARISON:  08/05/2023 and 12/18/2019. FINDINGS: Borderline enlarged heart with an interval decrease  in size. Tortuous and partially calcified thoracic aorta. Post CABG changes. Stable mild interstitial prominence with Kerley lines, not present on 12/18/2019. Minimal patchy density at the left lung base with improvement. No pleural fluid. Unremarkable bones. IMPRESSION: 1. Mild congestive heart failure. 2. Minimal left basilar atelectasis or alveolar edema with improvement. Electronically Signed   By: Catherin Closs M.D.   On: 03/22/2024 17:03        Scheduled Meds:  amLODipine   10 mg Oral Daily   aspirin  EC  81 mg Oral Daily   calcitRIOL   0.25 mcg Oral Daily   doxycycline   100 mg Oral Q12H   fluticasone   2 spray Each Nare Daily   heparin  injection (subcutaneous)  5,000 Units Subcutaneous Q8H   insulin  aspart  0-9 Units Subcutaneous TID WC   ipratropium-albuterol   3 mL Nebulization Q4H   isosorbide  mononitrate  30 mg  Oral Daily   methylPREDNISolone  (SOLU-MEDROL ) injection  40 mg Intravenous Q12H   metoprolol  tartrate  50 mg Oral BID   pantoprazole   40 mg Oral Daily   potassium chloride  SA  20 mEq Oral Daily   tacrolimus   4 mg Oral BID   Continuous Infusions:   LOS: 1 day    Time spent: 35 minutes    [name] Juhasz A Billie Intriago, MD [facility]   If 7PM-7AM, please contact night-coverage [email or web address]  03/23/2024, 8:06 AM

## 2024-03-23 NOTE — Evaluation (Signed)
 Physical Therapy Evaluation Patient Details Name: [name] MRN: [id number] DOB: 01-20-1941 Today's Date: 03/23/2024  History of Present Illness  Patient is an 83 yo female admitted on 03/22/24 with progressive shortness of breath. PMH significant for COPD on 6L home O2, CAD, CHF, asthma, hx of CABG, ESRD s/p transplant in 2012 on immunosuppression, T2DM, and gout.  Clinical Impression  Prior to admission, patient lives in one level home with ramped entrance. She lives alone but reports family assists as able. She ambulates household distances with rollator and completes ADLs modI. She reports increased difficulty and increased time to complete her ADLs. She is currently on 6L/min via nasal cannula which she reports is her baseline. During PT session, patient would become SOB with conversation while resting in supine with O2 varying 88-93% spO2. Patient completes bed mobility with supervision, sit to stand transfers with CGA, and minA for EOB to chair transfer. After chair transfer, patient's O2 sats 86-91% spO2 with 2-3 minute recovery to > 92% spO2. Patient remained up in chair end of session and respiratory therapy present for breathing treatment. Patient will benefit from continued skilled acute PT and recommend return home with HHPT. Patient would also benefit from hired [facility] aide to assist with ADLs due to poor activity tolerance.      If plan is discharge home, recommend the following: A little help with walking and/or transfers;A little help with bathing/dressing/bathroom;Help with stairs or ramp for entrance;Assistance with cooking/housework   Can travel by private vehicle        Equipment Recommendations None recommended by PT;Other (comment) (Patient has necessary equipment.)  Recommendations for Other Services       Functional Status Assessment Patient has had a recent decline in their functional status and demonstrates the ability to make significant improvements in function in  a reasonable and predictable amount of time.     Precautions / Restrictions Precautions Precautions: Other (comment) Precaution/Restrictions Comments: monitor O2 Restrictions Weight Bearing Restrictions Per Provider Order: No      Mobility  Bed Mobility Overal bed mobility: Needs Assistance Bed Mobility: Supine to Sit     Supine to sit: Supervision          Transfers Overall transfer level: Needs assistance Equipment used: 1 person hand held assist Transfers: Sit to/from Stand, Bed to chair/wheelchair/BSC Sit to Stand: Contact guard assist Stand pivot transfers: Min assist              Ambulation/Gait             General Gait Details: Unable to complete gait secondary to O2 dropping with chair transfer.       Balance Overall balance assessment: Needs assistance Sitting-balance support: No upper extremity supported, Feet supported Sitting balance-Leahy Scale: Fair     Standing balance support: Single extremity supported Standing balance-Leahy Scale: Poor                               Pertinent Vitals/Pain Pain Assessment Pain Assessment: No/denies pain    Home Living Family/patient expects to be discharged to:: Private residence Living Arrangements: Alone Available Help at Discharge: Family;Available PRN/intermittently Type of Home: House Home Access: Stairs to enter;Ramped entrance Entrance Stairs-Rails: Right Entrance Stairs-Number of Steps: 3   Home Layout: One level Home Equipment: Hand held shower head;Cane - single point;Rollator (4 wheels);Other (comment);Shower seat;BSC/3in1;Electric scooter Additional Comments: Reports she just recently got an Art gallery manager and uses around the home  sometimes but does not have the ramp for car yet for use outside of home.    Prior Function Prior Level of Function : Independent/Modified Independent             Mobility Comments: Patient uses 4WW for household ambulation and  occasionally uses electric scooter. Has home O2 and portable tank. Dietra Fraction will help patient for tub/shower transfers, otherwise patient reports independence. She does report ADLs taking longer secondary to fatigue and SOB. She would like to have increased help at home with household chores. She denies any falls in the past year.       Extremity/Trunk Assessment        Lower Extremity Assessment Lower Extremity Assessment: Generalized weakness    Cervical / Trunk Assessment Cervical / Trunk Assessment: Normal  Communication   Communication Communication: No apparent difficulties    Cognition Arousal: Alert Behavior During Therapy: WFL for tasks assessed/performed   PT - Cognitive impairments: No apparent impairments                         Following commands: Intact       Cueing Cueing Techniques: Verbal cues, Tactile cues     General Comments General comments (skin integrity, edema, etc.): Seated balance at EOB for 4 minutes with cues for deep breathing.    Exercises     Assessment/Plan    PT Assessment Patient needs continued PT services  PT Problem List Decreased strength;Decreased balance;Cardiopulmonary status limiting activity;Decreased mobility;Decreased activity tolerance       PT Treatment Interventions DME instruction;Functional mobility training;Balance training;Patient/family education;Gait training;Therapeutic activities;Therapeutic exercise;Neuromuscular re-education    PT Goals (Current goals can be found in the Care Plan section)  Acute Rehab PT Goals Patient Stated Goal: Patient's goal is to return home with increased help for her ADLs. PT Goal Formulation: With patient Time For Goal Achievement: 04/06/24 Potential to Achieve Goals: Good    Frequency Min 2X/week        AM-PAC PT "6 Clicks" Mobility  Outcome Measure Help needed turning from your back to your side while in a flat bed without using bedrails?: A Little Help needed  moving from lying on your back to sitting on the side of a flat bed without using bedrails?: A Little Help needed moving to and from a bed to a chair (including a wheelchair)?: A Little Help needed standing up from a chair using your arms (e.g., wheelchair or bedside chair)?: A Little Help needed to walk in hospital room?: A Lot Help needed climbing 3-5 steps with a railing? : Total 6 Click Score: 15    End of Session Equipment Utilized During Treatment: Gait belt Activity Tolerance: Other (comment) (limited by O2 desaturation with light activity) Patient left: in chair;Other (comment) (Respiratory therapy present) Nurse Communication: Mobility status PT Visit Diagnosis: Unsteadiness on feet (R26.81);Muscle weakness (generalized) (M62.81);Other abnormalities of gait and mobility (R26.89)    Time: 8119-1478 PT Time Calculation (min) (ACUTE ONLY): 31 min   Charges:   PT Evaluation $PT Eval Moderate Complexity: 1 Mod PT Treatments $Therapeutic Activity: 8-22 mins PT General Charges $$ ACUTE PT VISIT: 1 Visit         Mariano Shiver, PT, DPT [facility] Acute Rehabilitation Office: [phone]   [name] Erp 03/23/2024, 12:30 PM

## 2024-03-24 ENCOUNTER — Inpatient Hospital Stay (HOSPITAL_COMMUNITY)

## 2024-03-24 DIAGNOSIS — J9601 Acute respiratory failure with hypoxia: Secondary | ICD-10-CM | POA: Diagnosis not present

## 2024-03-24 DIAGNOSIS — R0609 Other forms of dyspnea: Secondary | ICD-10-CM | POA: Diagnosis not present

## 2024-03-24 LAB — CBC
HCT: 36.8 % (ref 36.0–46.0)
Hemoglobin: 12.5 g/dL (ref 12.0–15.0)
MCH: 23.9 pg — ABNORMAL LOW (ref 26.0–34.0)
MCHC: 34 g/dL (ref 30.0–36.0)
MCV: 70.4 fL — ABNORMAL LOW (ref 80.0–100.0)
Platelets: 137 10*3/uL — ABNORMAL LOW (ref 150–400)
RBC: 5.23 MIL/uL — ABNORMAL HIGH (ref 3.87–5.11)
RDW: 18.4 % — ABNORMAL HIGH (ref 11.5–15.5)
WBC: 6.4 10*3/uL (ref 4.0–10.5)
nRBC: 0 % (ref 0.0–0.2)

## 2024-03-24 LAB — BASIC METABOLIC PANEL WITH GFR
Anion gap: 12 (ref 5–15)
BUN: 41 mg/dL — ABNORMAL HIGH (ref 8–23)
CO2: 20 mmol/L — ABNORMAL LOW (ref 22–32)
Calcium: 9.3 mg/dL (ref 8.9–10.3)
Chloride: 104 mmol/L (ref 98–111)
Creatinine, Ser: 1.77 mg/dL — ABNORMAL HIGH (ref 0.44–1.00)
GFR, Estimated: 29 mL/min — ABNORMAL LOW (ref >60)
Glucose, Bld: 196 mg/dL — ABNORMAL HIGH (ref 70–99)
Potassium: 4 mmol/L (ref 3.5–5.1)
Sodium: 136 mmol/L (ref 135–145)

## 2024-03-24 LAB — ECHOCARDIOGRAM COMPLETE
Area-P 1/2: 3.54 cm2
Height: 62 in
P 1/2 time: 408 ms
S' Lateral: 2.3 cm
Weight: 1936.52 [oz_av]

## 2024-03-24 LAB — GLUCOSE, CAPILLARY
Glucose-Capillary: 139 mg/dL — ABNORMAL HIGH (ref 70–99)
Glucose-Capillary: 203 mg/dL — ABNORMAL HIGH (ref 70–99)
Glucose-Capillary: 140 mg/dL — ABNORMAL HIGH (ref 70–99)
Glucose-Capillary: 161 mg/dL — ABNORMAL HIGH (ref 70–99)

## 2024-03-24 MED ORDER — IPRATROPIUM-ALBUTEROL 0.5-2.5 (3) MG/3ML IN SOLN
3.0000 mL | Freq: Two times a day (BID) | RESPIRATORY_TRACT | Status: DC
  Filled 2024-03-24: qty 3

## 2024-03-24 NOTE — Progress Notes (Signed)
 Mobility Specialist Progress Note;   03/24/24 1110  Mobility  Activity Transferred from bed to chair  Level of Assistance Contact guard assist, steadying assist  Assistive Device Other (Comment) (HHA)  Activity Response Tolerated well  Mobility Referral Yes  Mobility visit 1 Mobility  Mobility Specialist Start Time (ACUTE ONLY) 1110  Mobility Specialist Stop Time (ACUTE ONLY) 1124  Mobility Specialist Time Calculation (min) (ACUTE ONLY) 14 min   Pt agreeable to mobility. Required MinG assistance via HHA to safely transfer pt from bed to chair. VSS on 6LO2. Pt left in chair with all needs met, call bell in reach. NT in room.   Janit Meline Mobility Specialist Please contact via SecureChat or [employer] [phone]

## 2024-03-24 NOTE — Progress Notes (Signed)
  Echocardiogram 2D Echocardiogram has been performed.  Fain Home RDCS 03/24/2024, 12:01 PM

## 2024-03-24 NOTE — Evaluation (Signed)
 Occupational Therapy Evaluation Patient Details Name: [name] MRN: [id number] DOB: Nov 29, 1940 Today's Date: 03/24/2024   History of Present Illness   Patient is an 83 yo female admitted on 03/22/24 with progressive shortness of breath. PMH significant for COPD on 6L home O2, CAD, CHF, asthma, hx of CABG, ESRD s/p transplant in 2012 on immunosuppression, T2DM, and gout.     Clinical Impressions Pt presents with decline in function and safety with ADLs and ADL mobility with impaired strength, balance and endurance. PTA pt reports that she lives alone but that her grandson stays there sometimes. Pt is on 6L home O2 and reports that family assists as able, she's Ind with ADLs/selfcare, cooks, drives, grocery shops, manages meds and uses rollator for mobility. Pt reports increased difficulty with increased time to complete her ADLs. Currently pt requires CGA with LB ADLs, toileting and mobility HHA and  would become SOB with O2 SATs dropping from 96% on 6L O2 to 81% during in room activity. Pt performed deep, pursed lip breathing to recover to 91-93% once returned to supine in bed. Pt would benefit from acute OT services to address impairments to maximize level of function and safety. Recommend HH PT services and HH aide to assist with ADLs due to poor activity tolerance.      If plan is discharge home, recommend the following:   A little help with bathing/dressing/bathroom;A little help with walking and/or transfers;Assistance with cooking/housework;Assist for transportation;Help with stairs or ramp for entrance     Functional Status Assessment   Patient has had a recent decline in their functional status and demonstrates the ability to make significant improvements in function in a reasonable and predictable amount of time.     Equipment Recommendations   Other (comment) (defer)     Recommendations for Other Services         Precautions/Restrictions    Precautions Precautions: Other (comment) Precaution/Restrictions Comments: monitor O2 Restrictions Weight Bearing Restrictions Per Provider Order: No     Mobility Bed Mobility Overal bed mobility: Needs Assistance Bed Mobility: Supine to Sit, Sit to Supine     Supine to sit: Supervision Sit to supine: Supervision        Transfers Overall transfer level: Needs assistance Equipment used: 1 person hand held assist Transfers: Sit to/from Stand, Bed to chair/wheelchair/BSC Sit to Stand: Contact guard assist Stand pivot transfers: Contact guard assist                Balance Overall balance assessment: Needs assistance Sitting-balance support: No upper extremity supported, Feet supported Sitting balance-Leahy Scale: Fair     Standing balance support: Single extremity supported, During functional activity Standing balance-Leahy Scale: Poor                             ADL either performed or assessed with clinical judgement   ADL Overall ADL's : Needs assistance/impaired Eating/Feeding: Independent   Grooming: Wash/dry hands;Wash/dry face;Contact guard assist;Standing   Upper Body Bathing: Set up   Lower Body Bathing: Contact guard assist   Upper Body Dressing : Set up   Lower Body Dressing: Contact guard assist   Toilet Transfer: Contact guard assist;Ambulation;BSC/3in1   Toileting- Clothing Manipulation and Hygiene: Contact guard assist;Sit to/from stand       Functional mobility during ADLs: Contact guard assist General ADL Comments: pt's O2 SATs droppng from 96% on 6L O2 to 81% during in room activity. Pt performed deep, pursed  lip breathing to recover to 91-93% once returned to supine in bed     Vision Baseline Vision/History: 1 Wears glasses Ability to See in Adequate Light: 0 Adequate Patient Visual Report: No change from baseline       Perception         Praxis         Pertinent Vitals/Pain Pain Assessment Pain Assessment:  No/denies pain     Extremity/Trunk Assessment Upper Extremity Assessment Upper Extremity Assessment: Generalized weakness   Lower Extremity Assessment Lower Extremity Assessment: Generalized weakness   Cervical / Trunk Assessment Cervical / Trunk Assessment: Normal   Communication Communication Communication: No apparent difficulties   Cognition Arousal: Alert Behavior During Therapy: WFL for tasks assessed/performed Cognition: No apparent impairments                               Following commands: Intact       Cueing  General Comments   Cueing Techniques: Verbal cues      Exercises     Shoulder Instructions      Home Living Family/patient expects to be discharged to:: Private residence Living Arrangements: Other (Comment) (grandson lives with pt per pt report) Available Help at Discharge: Family;Available PRN/intermittently Type of Home: House Home Access: Stairs to enter;Ramped entrance Entrance Stairs-Number of Steps: 3 Entrance Stairs-Rails: Right Home Layout: One level     Bathroom Shower/Tub: Chief Strategy Officer: Handicapped height Bathroom Accessibility: Yes   Home Equipment: Hand held shower head;Cane - single point;Rollator (4 wheels);Other (comment);Shower seat;BSC/3in1;Electric scooter   Additional Comments: Reports she just recently got an Art gallery manager and uses around the home sometimes but does not have the ramp for car yet for use outside of home.      Prior Functioning/Environment Prior Level of Function : Independent/Modified Independent;Driving             Mobility Comments: Patient uses 4WW for household ambulation and occasionally uses electric scooter. Has home O2 and portable tank. ADLs Comments: Ind with ADLs/selfcare, cooks, IT sales professional. Grandson will assist with tub shower transfers. Pt reports that ADLs requring extra time to complete due to SOB and fatigue    OT Problem List: Decreased  activity tolerance;Decreased strength;Cardiopulmonary status limiting activity   OT Treatment/Interventions: Self-care/ADL training;Energy conservation;Therapeutic exercise;DME and/or AE instruction;Therapeutic activities;Patient/family education      OT Goals(Current goals can be found in the care plan section)   Acute Rehab OT Goals Patient Stated Goal: go home OT Goal Formulation: With patient Time For Goal Achievement: 04/07/24 Potential to Achieve Goals: Good ADL Goals Pt Will Perform Grooming: with supervision;with modified independence Pt Will Perform Lower Body Bathing: with supervision;with modified independence;sit to/from stand Pt Will Perform Lower Body Dressing: with supervision;with modified independence;sit to/from stand Pt Will Transfer to Toilet: with supervision;with modified independence;ambulating Pt Will Perform Toileting - Clothing Manipulation and hygiene: with supervision;with modified independence;sit to/from stand Additional ADL Goal #1: Pt will verbalize and demo 3 energy conservation strategies for ADLs and ADL mobility   OT Frequency:  Min 2X/week    Co-evaluation              AM-PAC OT "6 Clicks" Daily Activity     Outcome Measure Help from another person eating meals?: None Help from another person taking care of personal grooming?: A Little Help from another person toileting, which includes using toliet, bedpan, or urinal?: A Little Help from another person  bathing (including washing, rinsing, drying)?: A Little Help from another person to put on and taking off regular upper body clothing?: A Little Help from another person to put on and taking off regular lower body clothing?: A Little 6 Click Score: 19   End of Session Equipment Utilized During Treatment: Gait belt;Rolling walker (2 wheels);Other (comment) [facility]) Nurse Communication: Mobility status  Activity Tolerance: Patient limited by fatigue Patient left: in bed;with call bell/phone  within reach  OT Visit Diagnosis: Other abnormalities of gait and mobility (R26.89);Muscle weakness (generalized) (M62.81)                Time: 7829-5621 OT Time Calculation (min): 24 min Charges:  OT General Charges $OT Visit: 1 Visit OT Evaluation $OT Eval Moderate Complexity: 1 Mod OT Treatments $Therapeutic Activity: 8-22 mins    [name] 03/24/2024, 2:41 PM

## 2024-03-24 NOTE — Progress Notes (Signed)
 PROGRESS NOTE    [name]  ZOX:[id number] DOB: [birth year] DOA: 03/22/2024 PCP: Claud Crumb, MD   Brief Narrative: 83 year old with past medical history significant for CAD status post CABG, hypertension, asthma, COPD follow-up with pulmonologist at [facility], status post renal transplant on immunosuppressant presented to the ER complaining of worsening shortness of breath for the last 3 days.  Reports productive cough and wheezing, chest tightness.  In the ED patient required BiPAP for hypoxic respiratory failure.  Chest x-ray showed pulmonary edema, BNP 705.  Troponins flat.  Patient admitted for COPD exacerbation and heart failure exacerbation.   Assessment & Plan:   Principal Problem:   Acute respiratory failure with hypoxia (HCC) Active Problems:   GOUT   THROMBOCYTOPENIA   Hypertension   Hx of CABG   S/P kidney transplant   GERD (gastroesophageal reflux disease)   Severe persistent asthma with acute exacerbation   COPD exacerbation (HCC)   CKD (chronic kidney disease) stage 3, GFR 30-59 ml/min (HCC)   Diabetes mellitus type 2 in nonobese (HCC)   Acute on chronic hypoxic respiratory failure in the setting of COPD and heart failure exacerbation -Required BiPAP in the ED. -Presented with dyspnea (increased work of breathing), elevated BNP, chest x-ray:Mild congestive heart failure. Minimal left basilar atelectasis or alveolar edema with improvement. - Continue with IV Lasix  -Follow 2D echo -  continue Pulmicort and Brovana - Continue with DuoNeb - Continue guaifenesin  - Continue BiPAP as needed, currently on 6 L of oxygen  (home requirement)  Para - influenza infection Support care for parainfluenza virus infection  CAD status post CABG: -Had some chest pain suspect related to COPD -Troponin flat  History of renal transplant on CKD stage IIIb: - On prednisone  at home presently on Solu-Medrol  - Creatinine baseline 1.6 - Follow tacrolimus  level  Hypertension: -  Continue with Norvasc  - Started on Lasix   Diabetes type 2: - Sliding scale insulin   Thrombocytopenia: - Chronic. will follow trend  Weight loss - This is being followed up by the patient's PCP.   Estimated body mass index is 22.14 kg/m as calculated from the following:   Height as of this encounter: 5\' 2"  (1.575 m).   Weight as of this encounter: 54.9 kg.   DVT prophylaxis: Heparin  Code Status: DNR limited Family Communication: n/a Disposition Plan:  Status is: Inpatient Remains inpatient appropriate because: Management of respiratory failure  Consultants:  None  Procedures:  Echo    Subjective: Patient is feeling better though she complains of lack of energy and is concerned about her ongoing weight loss and poor appetite. .  Objective: Vitals:   03/24/24 0754 03/24/24 0802 03/24/24 1124 03/24/24 1210  BP:   (!) 149/63   Pulse:   85   Resp:   16   Temp:   98.4 F (36.9 C)   TempSrc:   Oral   SpO2: 95% 98% 93% 95%  Weight:      Height:        Intake/Output Summary (Last 24 hours) at 03/24/2024 1554 Last data filed at 03/24/2024 1536 Gross per 24 hour  Intake 660 ml  Output 2150 ml  Net -1490 ml   Filed Weights   03/22/24 1600 03/22/24 2227  Weight: 54.9 kg 54.9 kg   Physical Exam   General: Alert, oriented X3  Eyes: Pupils equal, reactive  Oral cavity: moist mucous membranes  Head: Atraumatic, normocephalic  Neck: supple  Chest: clear to auscultation. No crackles, no wheezes  CVS: S1,S2 RRR.  No murmurs  Abd: No distention, soft, non-tender. No masses palpable  Extr: No edema   MSK: No joint deformities or swelling  Neurological: Grossly intact.      Data Reviewed: I have personally reviewed following labs and imaging studies  CBC: Recent Labs  Lab 03/22/24 1610 03/23/24 0336 03/24/24 1004  WBC 5.5 2.7* 6.4  NEUTROABS 4.0  --   --   HGB 12.5 11.8* 12.5  HCT 37.0 35.2* 36.8  MCV 72.0* 70.8* 70.4*  PLT 125* 118* 137*   Basic  Metabolic Panel: Recent Labs  Lab 03/22/24 1610 03/23/24 0336 03/24/24 1004  NA 139 137 136  K 4.2 4.4 4.0  CL 105 103 104  CO2 21* 22 20*  GLUCOSE 171* 163* 196*  BUN 25* 29* 41*  CREATININE 1.60* 1.69* 1.77*  CALCIUM  9.1 8.7* 9.3   GFR: Estimated Creatinine Clearance: 19.7 mL/min (A) (by C-G formula based on SCr of 1.77 mg/dL (H)). Liver Function Tests: Recent Labs  Lab 03/22/24 1610 03/23/24 0336  AST 23 18  ALT 17 15  ALKPHOS 45 37*  BILITOT 0.9 0.8  PROT 6.3* 5.6*  ALBUMIN 3.5 3.2*   No results for input(s): "LIPASE", "AMYLASE" in the last 168 hours. No results for input(s): "AMMONIA" in the last 168 hours. Coagulation Profile: No results for input(s): "INR", "PROTIME" in the last 168 hours. Cardiac Enzymes: No results for input(s): "CKTOTAL", "CKMB", "CKMBINDEX", "TROPONINI" in the last 168 hours. BNP (last 3 results) No results for input(s): "PROBNP" in the last 8760 hours. HbA1C: No results for input(s): "HGBA1C" in the last 72 hours. CBG: Recent Labs  Lab 03/23/24 1145 03/23/24 1648 03/23/24 2145 03/24/24 0734 03/24/24 1122  GLUCAP 144* 176* 169* 139* 203*   Lipid Profile: No results for input(s): "CHOL", "HDL", "LDLCALC", "TRIG", "CHOLHDL", "LDLDIRECT" in the last 72 hours. Thyroid  Function Tests: Recent Labs    03/22/24 2126  TSH 0.511   Anemia Panel: No results for input(s): "VITAMINB12", "FOLATE", "FERRITIN", "TIBC", "IRON", "RETICCTPCT" in the last 72 hours. Sepsis Labs: No results for input(s): "PROCALCITON", "LATICACIDVEN" in the last 168 hours.  Recent Results (from the past 240 hours)  Respiratory (~20 pathogens) panel by PCR     Status: Abnormal   Collection Time: 03/22/24  9:03 PM   Specimen: Nasopharyngeal Swab; Respiratory  Result Value Ref Range Status   Adenovirus NOT DETECTED NOT DETECTED Final   Coronavirus 229E NOT DETECTED NOT DETECTED Final    Comment: (NOTE) The Coronavirus on the Respiratory Panel, DOES NOT test for  the novel  Coronavirus (2019 nCoV)    Coronavirus HKU1 NOT DETECTED NOT DETECTED Final   Coronavirus NL63 NOT DETECTED NOT DETECTED Final   Coronavirus OC43 NOT DETECTED NOT DETECTED Final   Metapneumovirus NOT DETECTED NOT DETECTED Final   Rhinovirus / Enterovirus NOT DETECTED NOT DETECTED Final   Influenza A NOT DETECTED NOT DETECTED Final   Influenza B NOT DETECTED NOT DETECTED Final   Parainfluenza Virus 1 NOT DETECTED NOT DETECTED Final   Parainfluenza Virus 2 DETECTED (A) NOT DETECTED Final   Parainfluenza Virus 3 NOT DETECTED NOT DETECTED Final   Parainfluenza Virus 4 NOT DETECTED NOT DETECTED Final   Respiratory Syncytial Virus NOT DETECTED NOT DETECTED Final   Bordetella pertussis NOT DETECTED NOT DETECTED Final   Bordetella Parapertussis NOT DETECTED NOT DETECTED Final   Chlamydophila pneumoniae NOT DETECTED NOT DETECTED Final   Mycoplasma pneumoniae NOT DETECTED NOT DETECTED Final    Comment: Performed at [facility]  Lab, [address] [place], [place] [address]         Radiology Studies: DG Chest Portable 1 View Result Date: 03/22/2024 CLINICAL DATA:  Dyspnea. EXAM: PORTABLE CHEST 1 VIEW COMPARISON:  08/05/2023 and 12/18/2019. FINDINGS: Borderline enlarged heart with an interval decrease in size. Tortuous and partially calcified thoracic aorta. Post CABG changes. Stable mild interstitial prominence with Kerley lines, not present on 12/18/2019. Minimal patchy density at the left lung base with improvement. No pleural fluid. Unremarkable bones. IMPRESSION: 1. Mild congestive heart failure. 2. Minimal left basilar atelectasis or alveolar edema with improvement. Electronically Signed   By: Catherin Closs M.D.   On: 03/22/2024 17:03        Scheduled Meds:  amLODipine   10 mg Oral Daily   arformoterol  15 mcg Nebulization BID   aspirin  EC  81 mg Oral Daily   budesonide (PULMICORT) nebulizer solution  0.25 mg Nebulization BID   calcitRIOL   0.25 mcg Oral Daily    doxycycline   100 mg Oral Q12H   fluticasone   2 spray Each Nare Daily   furosemide   40 mg Intravenous Daily   guaiFENesin   600 mg Oral BID   heparin  injection (subcutaneous)  5,000 Units Subcutaneous Q8H   insulin  aspart  0-9 Units Subcutaneous TID WC   ipratropium-albuterol   3 mL Nebulization BID   isosorbide  mononitrate  30 mg Oral Daily   methylPREDNISolone  (SOLU-MEDROL ) injection  40 mg Intravenous Q12H   metoprolol  tartrate  50 mg Oral BID   pantoprazole   40 mg Oral Daily   potassium chloride  SA  20 mEq Oral Daily   tacrolimus   4 mg Oral BID   Continuous Infusions:   LOS: 2 days    Time spent: 35 minutes    MDALA-GAUSI, Latina Frank AGATHA, MD [facility]   If 7PM-7AM, please contact night-coverage [email or web address]  03/24/2024, 3:54 PM

## 2024-03-24 NOTE — Plan of Care (Signed)
  Problem: Coping: Goal: Ability to adjust to condition or change in health will improve Outcome: Progressing   Problem: Fluid Volume: Goal: Ability to maintain a balanced intake and output will improve Outcome: Progressing   Problem: Nutritional: Goal: Maintenance of adequate nutrition will improve Outcome: Progressing Goal: Progress toward achieving an optimal weight will improve Outcome: Progressing   Problem: Skin Integrity: Goal: Risk for impaired skin integrity will decrease Outcome: Progressing   Problem: Tissue Perfusion: Goal: Adequacy of tissue perfusion will improve Outcome: Progressing

## 2024-03-25 DIAGNOSIS — J9601 Acute respiratory failure with hypoxia: Secondary | ICD-10-CM | POA: Diagnosis not present

## 2024-03-25 LAB — BASIC METABOLIC PANEL WITH GFR
Anion gap: 8 (ref 5–15)
BUN: 53 mg/dL — ABNORMAL HIGH (ref 8–23)
CO2: 26 mmol/L (ref 22–32)
Calcium: 9.1 mg/dL (ref 8.9–10.3)
Chloride: 103 mmol/L (ref 98–111)
Creatinine, Ser: 2.14 mg/dL — ABNORMAL HIGH (ref 0.44–1.00)
GFR, Estimated: 23 mL/min — ABNORMAL LOW (ref >60)
Glucose, Bld: 153 mg/dL — ABNORMAL HIGH (ref 70–99)
Potassium: 5 mmol/L (ref 3.5–5.1)
Sodium: 137 mmol/L (ref 135–145)

## 2024-03-25 LAB — GLUCOSE, CAPILLARY
Glucose-Capillary: 150 mg/dL — ABNORMAL HIGH (ref 70–99)
Glucose-Capillary: 162 mg/dL — ABNORMAL HIGH (ref 70–99)

## 2024-03-25 LAB — TACROLIMUS LEVEL: Tacrolimus (FK506) - LabCorp: 4.6 ng/mL — ABNORMAL LOW (ref 5.0–20.0)

## 2024-03-25 MED ORDER — PREDNISONE 20 MG PO TABS: 40.0000 mg | ORAL_TABLET | Freq: Every day | ORAL | 0 refills | Status: AC

## 2024-03-25 MED ORDER — DOXYCYCLINE HYCLATE 100 MG PO TABS: 100.0000 mg | ORAL_TABLET | Freq: Two times a day (BID) | ORAL | 0 refills | Status: AC

## 2024-03-25 NOTE — Progress Notes (Signed)
 Physical Therapy Treatment Patient Details Name: [name] MRN: [id number] DOB: Sep 15, 1941 Today's Date: 03/25/2024   History of Present Illness Patient is an 83 yo female admitted on 03/22/24 with progressive shortness of breath. PMH significant for COPD on 6L home O2, CAD, CHF, asthma, hx of CABG, ESRD s/p transplant in 2012 on immunosuppression, T2DM, and gout.    PT Comments  Pt  sitting EOB requesting to get to [facility] on entry. Pt is able to pivot to and from [facility] with supervision. Pt experiences 3/4 DoE and drop to 88%O2 on 6L O2 via [place]. With cues for purse lip breathing with emphasis on inhalation through her nose SpO2 rebounds to 92%O2. Pt to discharge home this afternoon.     If plan is discharge home, recommend the following: A little help with walking and/or transfers;A little help with bathing/dressing/bathroom;Help with stairs or ramp for entrance;Assistance with cooking/housework   Can travel by private vehicle      Yes  Equipment Recommendations  None recommended by PT;Other (comment) (Patient has necessary equipment.)       Precautions / Restrictions Precautions Precautions: Other (comment) Precaution/Restrictions Comments: monitor O2 Restrictions Weight Bearing Restrictions Per Provider Order: No     Mobility  Bed Mobility Overal bed mobility: Needs Assistance Bed Mobility: Supine to Sit, Sit to Supine     Supine to sit: Supervision Sit to supine: Supervision        Transfers Overall transfer level: Needs assistance Equipment used: None Transfers: Sit to/from Stand, Bed to chair/wheelchair/BSC Sit to Stand: Supervision   Step pivot transfers: Supervision       General transfer comment: pt use bed rail to steady with coming to standing  and once steady she is able to step pivot to and from [facility] with supervision    Ambulation/Gait               General Gait Details: Unable to complete gait secondary to O2 dropping with chair transfer.          Balance Overall balance assessment: Needs assistance Sitting-balance support: No upper extremity supported, Feet supported Sitting balance-Leahy Scale: Fair     Standing balance support: Single extremity supported, During functional activity Standing balance-Leahy Scale: Poor                              Communication Communication Communication: No apparent difficulties  Cognition Arousal: Alert Behavior During Therapy: WFL for tasks assessed/performed   PT - Cognitive impairments: No apparent impairments                         Following commands: Intact      Cueing Cueing Techniques: Verbal cues     General Comments General comments (skin integrity, edema, etc.): pt with 3/4 DoE SpO2 drops to 88%O with step pivot to and from [facility], cue for deep breathing through her nose and SpO2 rebounds to 92%O2 on 6L O2      Pertinent Vitals/Pain Pain Assessment Pain Assessment: No/denies pain     PT Goals (current goals can now be found in the care plan section) Acute Rehab PT Goals Patient Stated Goal: Patient's goal is to return home with increased help for her ADLs. PT Goal Formulation: With patient Time For Goal Achievement: 04/06/24 Potential to Achieve Goals: Good Progress towards PT goals: Progressing toward goals    Frequency    Min 2X/week  AM-PAC PT "6 Clicks" Mobility   Outcome Measure  Help needed turning from your back to your side while in a flat bed without using bedrails?: A Little Help needed moving from lying on your back to sitting on the side of a flat bed without using bedrails?: A Little Help needed moving to and from a bed to a chair (including a wheelchair)?: A Little Help needed standing up from a chair using your arms (e.g., wheelchair or bedside chair)?: A Little Help needed to walk in hospital room?: A Lot Help needed climbing 3-5 steps with a railing? : Total 6 Click Score: 15    End of Session Equipment  Utilized During Treatment: Gait belt Activity Tolerance: Other (comment) (limited by O2 desaturation with light activity) Patient left: in chair;Other (comment) (Respiratory therapy present) Nurse Communication: Mobility status PT Visit Diagnosis: Unsteadiness on feet (R26.81);Muscle weakness (generalized) (M62.81);Other abnormalities of gait and mobility (R26.89)     Time: 1141-1200 PT Time Calculation (min) (ACUTE ONLY): 19 min  Charges:    $Therapeutic Activity: 8-22 mins PT General Charges $$ ACUTE PT VISIT: 1 Visit                     [name] PT, DPT Acute Rehabilitation Services Please use secure chat or  Call Office [phone]    [name] [facility] 03/25/2024, 12:13 PM

## 2024-03-25 NOTE — Discharge Summary (Signed)
 Physician Discharge Summary   Patient: [name] MRN: [id number] DOB: [birth year]  Admit date:     03/22/2024  Discharge date: 03/25/24  Discharge Physician: MDALA-GAUSI, Jilda Most   PCP: Alfonso Angles Conway Dennis, MD   Recommendations at discharge:    Follow up with PCP  Discharge Diagnoses: Principal Problem:   Acute respiratory failure with hypoxia (HCC) Active Problems:   GOUT   THROMBOCYTOPENIA   Hypertension   Hx of CABG   S/P kidney transplant   GERD (gastroesophageal reflux disease)   Severe persistent asthma with acute exacerbation   COPD exacerbation (HCC)   CKD (chronic kidney disease) stage 3, GFR 30-59 ml/min (HCC)   Diabetes mellitus type 2 in nonobese [facility])  Resolved Problems:   * No resolved hospital problems. *  Hospital Course: 83 year old woman with PMH of CAD s/p CABG, hypertension, asthma, COPD follow-up with pulmonologist at [facility], status post renal transplant on immunosuppressant who presented to the ER complaining of worsening shortness of breath for 3 days.  Reported productive cough and wheezing, chest tightness.   In the ED patient required BiPAP for hypoxic respiratory failure.  Chest x-ray showed pulmonary edema, BNP 705.  Troponins flat. Parainfluenza positive. Patient admitted for parainfluenza infection, COPD exacerbation and heart failure exacerbation.  The hospital course is in problem-based format below:    Acute on chronic hypoxic respiratory failure in the setting of COPD and heart failure exacerbation -Required BiPAP in the ED. -Presented with dyspnea (increased work of breathing), elevated BNP, chest x-ray:Mild congestive heart failure. Minimal left basilar atelectasis or alveolar edema with improvement. - Underlying causes were treated as indicated below.  - Patient returned to her baseline oxygen  requirement of 6 L/min.  HFpEF exacerbation Presented with shortness of breath, BNP of 785.  TTE revealed EF 60-65% with diastolic  dysfunction and no regional wall motion abnormalities.  Patient was diuresed with IV lasix  and reached euvolemia.  She did not require diuretics at discharge.   COPD exacerbation.  She was treated with doxycycline , inhaled Brovana, Pulmicort Duonebs and steroids.    Para - influenza infection Supportive care for parainfluenza virus infection was given.    CAD status post CABG Had some chest pain suspect related to COPD Troponins were flat   History of renal transplant on CKD stage IIIb Home medications were continued.  Creatinine remained close to baseline.    Hypertension: Home amlodipine , metoprolol  and Imdur  were continued.    Diabetes type 2 Diet controlled.  Last A1c (10/24/2023) was 6.1.    Weight loss This is being followed up by the patient's PCP.       Consultants: n/a Procedures performed: n/a  Disposition: Home Diet recommendation:  Discharge Diet Orders (From admission, onward)     Start     Ordered   03/25/24 0000  Diet - low sodium heart healthy        03/25/24 1059           Regular diet DISCHARGE MEDICATION: Allergies as of 03/25/2024       Reactions   Sodium Hypochlorite Shortness Of Breath   Lactose Nausea And Vomiting   Asa Arthritis Strength-antacid [aspirin  Buffered] Nausea And Vomiting, Other (See Comments)   STOMACH BURNS, also   Aspirin  Nausea And Vomiting   Per pt. "can tolerate the enteric coated tablets".    Atorvastatin Other (See Comments)   Patient's skin was skin was sensitive   Banana Nausea And Vomiting   Lactose Intolerance (gi) Nausea And Vomiting  Lisinopril Cough        Medication List     TAKE these medications    acetaminophen  650 MG CR tablet Commonly known as: TYLENOL  Take 650 mg by mouth every morning.   albuterol  (2.5 MG/3ML) 0.083% nebulizer solution Commonly known as: PROVENTIL  Take 2.5 mg by nebulization every 6 (six) hours as needed for wheezing or shortness of breath.   albuterol  108 (90  Base) MCG/ACT inhaler Commonly known as: VENTOLIN  HFA Inhale 1-2 puffs into the lungs every 6 (six) hours as needed for wheezing or shortness of breath.   amLODipine  10 MG tablet Commonly known as: NORVASC  Take 10 mg by mouth daily.   aspirin  EC 81 MG tablet Take 81 mg by mouth daily.   Azelastine  HCl 0.15 % Soln Place 1 spray into both nostrils daily as needed for allergies.   budesonide 0.25 MG/2ML nebulizer solution Commonly known as: PULMICORT Inhale 0.25 mg into the lungs as needed.   calcitRIOL  0.25 MCG capsule Commonly known as: ROCALTROL  Take 0.25 mcg by mouth daily.   doxycycline  100 MG tablet Commonly known as: VIBRA -TABS Take 1 tablet (100 mg total) by mouth every 12 (twelve) hours for 2 days.   ezetimibe  10 MG tablet Commonly known as: ZETIA  TAKE 1 TABLET BY MOUTH EVERY DAY   fluticasone  50 MCG/ACT nasal spray Commonly known as: FLONASE  Place into both nostrils.   isosorbide  mononitrate 30 MG 24 hr tablet Commonly known as: IMDUR  TAKE 1 TABLET (30 MG TOTAL) BY MOUTH DAILY. PLEASE KEEP SCHEDULE APPOINTMENT FOR FUTURE REFILLS   Klor-Con  M20 20 MEQ tablet Generic drug: potassium chloride  SA TAKE 1 TABLET BY MOUTH EVERY DAY   metoprolol  tartrate 50 MG tablet Commonly known as: LOPRESSOR  TAKE 1 TABLET BY MOUTH TWICE A DAY   nitroGLYCERIN  0.4 MG SL tablet Commonly known as: NITROSTAT  Place 1 tablet (0.4 mg total) under the tongue every 5 (five) minutes as needed for chest pain.   omeprazole 20 MG capsule Commonly known as: PRILOSEC Take 20 mg by mouth daily after breakfast.   OneTouch Verio test strip Generic drug: glucose blood 1 EACH BY OTHER ROUTE DAILY IN THE AFTERNOON. USE AS INSTRUCTED   predniSONE  5 MG tablet Commonly known as: DELTASONE  Take 5 mg by mouth 2 (two) times daily.   predniSONE  20 MG tablet Commonly known as: DELTASONE  Take 2 tablets (40 mg total) by mouth daily with breakfast for 2 days. Start taking on: Mar 26, 2024    tacrolimus  1 MG capsule Commonly known as: PROGRAF  Take 4 mg by mouth See admin instructions. Take 4 mg by mouth at 9 AM and 4 mg at 9 PM   Tiotropium Bromide  Monohydrate 1.25 MCG/ACT Aers Inhale 1 puff into the lungs daily.   Trelegy Ellipta 200-62.5-25 MCG/ACT Aepb Generic drug: Fluticasone -Umeclidin-Vilant Inhale 1 puff into the lungs daily.        Follow-up Information     [facility] Follow up.   Why: Physical and Occupational Therapy Contact information: [address] [place] [address] [phone]                Discharge Exam: Filed Weights   03/22/24 1600 03/22/24 2227  Weight: 54.9 kg 54.9 kg   Physical Exam on Day of Discharge   General: Alert, cheerful, oriented X3  Oral cavity: moist mucous membranes  Neck: supple  Chest: clear to auscultation. No crackles, no wheezes  CVS: S1,S2 RRR. No murmurs  Abd: No distention,  soft, non-tender. No masses palpable  Extr: No edema    Condition at discharge: stable  The results of significant diagnostics from this hospitalization (including imaging, microbiology, ancillary and laboratory) are listed below for reference.   Imaging Studies: ECHOCARDIOGRAM COMPLETE Result Date: 03/24/2024    ECHOCARDIOGRAM REPORT   Patient Name:   AZARRIA YTURRALDE Date of Exam: 03/24/2024 Medical Rec #:  [id number]        Height:       62.0 in Accession #:    [id number]       Weight:       121.0 lb Date of Birth:  [birth year]         BSA:          1.544 m Patient Age:    83 years         BP:           149/63 mmHg Patient Gender: F                HR:           81 bpm. Exam Location:  Inpatient Procedure: 2D Echo, Color Doppler and Cardiac Doppler (Both Spectral and Color            Flow Doppler were utilized during procedure). Indications:    Dyspnea R06.00  History:        Patient has prior history of Echocardiogram examinations, most                 recent 05/10/2023. CHF, CAD, Prior CABG, COPD; Risk                  Factors:Diabetes.  Sonographer:    Hersey Lorenzo RDCS Referring Phys: 9 ARSHAD N KAKRAKANDY IMPRESSIONS  1. Left ventricular ejection fraction, by estimation, is 60 to 65%. The left ventricle has normal function. The left ventricle has no regional wall motion abnormalities. There is mild concentric left ventricular hypertrophy. Left ventricular diastolic parameters are consistent with Grade II diastolic dysfunction (pseudonormalization).  2. Right ventricular systolic function is low normal. The right ventricular size is mildly enlarged. There is mildly elevated pulmonary artery systolic pressure.  3. Left atrial size was moderately dilated.  4. Right atrial size was moderately dilated.  5. The mitral valve is normal in structure. Trivial mitral valve regurgitation. No evidence of mitral stenosis.  6. The aortic valve is tricuspid. There is moderate calcification of the aortic valve. There is mild thickening of the aortic valve. Aortic valve regurgitation is mild to moderate. Aortic valve sclerosis/calcification is present, without any evidence of  aortic stenosis.  7. Aortic dilatation noted. There is borderline dilatation of the ascending aorta, measuring 38 mm.  8. The inferior vena cava is normal in size with greater than 50% respiratory variability, suggesting right atrial pressure of 3 mmHg. Comparison(s): Prior images reviewed side by side. Similar to prior, though RV mildly enlarged compared to prior. Conclusion(s)/Recommendation(s): Otherwise normal echocardiogram, with minor abnormalities described in the report. FINDINGS  Left Ventricle: Left ventricular ejection fraction, by estimation, is 60 to 65%. The left ventricle has normal function. The left ventricle has no regional wall motion abnormalities. The left ventricular internal cavity size was normal in size. There is  mild concentric left ventricular hypertrophy. Left ventricular diastolic parameters are consistent with Grade II diastolic  dysfunction (pseudonormalization). Right Ventricle: The right ventricular size is mildly enlarged. Right vetricular wall thickness was not well visualized. Right ventricular systolic function is low normal. There  is mildly elevated pulmonary artery systolic pressure. The tricuspid regurgitant velocity is 2.92 m/s, and with an assumed right atrial pressure of 3 mmHg, the estimated right ventricular systolic pressure is 37.1 mmHg. Left Atrium: Left atrial size was moderately dilated. Right Atrium: Right atrial size was moderately dilated. Pericardium: Trivial pericardial effusion is present. Mitral Valve: The mitral valve is normal in structure. Trivial mitral valve regurgitation. No evidence of mitral valve stenosis. Tricuspid Valve: The tricuspid valve is grossly normal. Tricuspid valve regurgitation is trivial. No evidence of tricuspid stenosis. Aortic Valve: The aortic valve is tricuspid. There is moderate calcification of the aortic valve. There is mild thickening of the aortic valve. Aortic valve regurgitation is mild to moderate. Aortic regurgitation PHT measures 408 msec. Aortic valve sclerosis/calcification is present, without any evidence of aortic stenosis. Pulmonic Valve: The pulmonic valve was not well visualized. Pulmonic valve regurgitation is mild to moderate. No evidence of pulmonic stenosis. Aorta: Aortic dilatation noted. There is borderline dilatation of the ascending aorta, measuring 38 mm. Venous: The inferior vena cava is normal in size with greater than 50% respiratory variability, suggesting right atrial pressure of 3 mmHg. IAS/Shunts: The atrial septum is grossly normal.  LEFT VENTRICLE PLAX 2D LVIDd:         3.50 cm   Diastology LVIDs:         2.30 cm   LV e' medial:    5.44 cm/s LV PW:         1.10 cm   LV E/e' medial:  10.8 LV IVS:        1.10 cm   LV e' lateral:   10.70 cm/s LVOT diam:     2.00 cm   LV E/e' lateral: 5.5 LV SV:         67 LV SV Index:   43 LVOT Area:     3.14 cm  RIGHT  VENTRICLE            IVC RV S prime:     9.00 cm/s  IVC diam: 1.60 cm TAPSE (M-mode): 1.5 cm LEFT ATRIUM             Index        RIGHT ATRIUM           Index LA diam:        3.70 cm 2.40 cm/m   RA Area:     23.80 cm LA Vol (A2C):   39.8 ml 25.77 ml/m  RA Volume:   76.70 ml  49.67 ml/m LA Vol (A4C):   53.2 ml 34.45 ml/m LA Biplane Vol: 51.1 ml 33.09 ml/m  AORTIC VALVE LVOT Vmax:   126.00 cm/s LVOT Vmean:  76.500 cm/s LVOT VTI:    0.212 m AI PHT:      408 msec  AORTA Ao Root diam: 3.40 cm Ao Asc diam:  3.80 cm MITRAL VALVE               TRICUSPID VALVE MV Area (PHT): 3.54 cm    TR Peak grad:   34.1 mmHg MV Decel Time: 214 msec    TR Vmax:        292.00 cm/s MV E velocity: 58.70 cm/s MV A velocity: 59.20 cm/s  SHUNTS MV E/A ratio:  0.99        Systemic VTI:  0.21 m  Systemic Diam: 2.00 cm Sheryle Donning MD Electronically signed by Sheryle Donning MD Signature Date/Time: 03/24/2024/5:43:18 PM    Final    DG Chest Portable 1 View Result Date: 03/22/2024 CLINICAL DATA:  Dyspnea. EXAM: PORTABLE CHEST 1 VIEW COMPARISON:  08/05/2023 and 12/18/2019. FINDINGS: Borderline enlarged heart with an interval decrease in size. Tortuous and partially calcified thoracic aorta. Post CABG changes. Stable mild interstitial prominence with Kerley lines, not present on 12/18/2019. Minimal patchy density at the left lung base with improvement. No pleural fluid. Unremarkable bones. IMPRESSION: 1. Mild congestive heart failure. 2. Minimal left basilar atelectasis or alveolar edema with improvement. Electronically Signed   By: Catherin Closs M.D.   On: 03/22/2024 17:03    Microbiology: Results for orders placed or performed during the hospital encounter of 03/22/24  Respiratory (~20 pathogens) panel by PCR     Status: Abnormal   Collection Time: 03/22/24  9:03 PM   Specimen: Nasopharyngeal Swab; Respiratory  Result Value Ref Range Status   Adenovirus NOT DETECTED NOT DETECTED Final    Coronavirus 229E NOT DETECTED NOT DETECTED Final    Comment: (NOTE) The Coronavirus on the Respiratory Panel, DOES NOT test for the novel  Coronavirus (2019 nCoV)    Coronavirus HKU1 NOT DETECTED NOT DETECTED Final   Coronavirus NL63 NOT DETECTED NOT DETECTED Final   Coronavirus OC43 NOT DETECTED NOT DETECTED Final   Metapneumovirus NOT DETECTED NOT DETECTED Final   Rhinovirus / Enterovirus NOT DETECTED NOT DETECTED Final   Influenza A NOT DETECTED NOT DETECTED Final   Influenza B NOT DETECTED NOT DETECTED Final   Parainfluenza Virus 1 NOT DETECTED NOT DETECTED Final   Parainfluenza Virus 2 DETECTED (A) NOT DETECTED Final   Parainfluenza Virus 3 NOT DETECTED NOT DETECTED Final   Parainfluenza Virus 4 NOT DETECTED NOT DETECTED Final   Respiratory Syncytial Virus NOT DETECTED NOT DETECTED Final   Bordetella pertussis NOT DETECTED NOT DETECTED Final   Bordetella Parapertussis NOT DETECTED NOT DETECTED Final   Chlamydophila pneumoniae NOT DETECTED NOT DETECTED Final   Mycoplasma pneumoniae NOT DETECTED NOT DETECTED Final    Comment: Performed at [facility], [address] [place], [place] [address]    Labs: CBC: Recent Labs  Lab 03/22/24 1610 03/23/24 0336 03/24/24 1004  WBC 5.5 2.7* 6.4  NEUTROABS 4.0  --   --   HGB 12.5 11.8* 12.5  HCT 37.0 35.2* 36.8  MCV 72.0* 70.8* 70.4*  PLT 125* 118* 137*   Basic Metabolic Panel: Recent Labs  Lab 03/22/24 1610 03/23/24 0336 03/24/24 1004 03/25/24 0546  NA 139 137 136 137  K 4.2 4.4 4.0 5.0  CL 105 103 104 103  CO2 21* 22 20* 26  GLUCOSE 171* 163* 196* 153*  BUN 25* 29* 41* 53*  CREATININE 1.60* 1.69* 1.77* 2.14*  CALCIUM  9.1 8.7* 9.3 9.1   Liver Function Tests: Recent Labs  Lab 03/22/24 1610 03/23/24 0336  AST 23 18  ALT 17 15  ALKPHOS 45 37*  BILITOT 0.9 0.8  PROT 6.3* 5.6*  ALBUMIN 3.5 3.2*   CBG: Recent Labs  Lab 03/24/24 0734 03/24/24 1122 03/24/24 1639 03/24/24 2142 03/25/24 0756  GLUCAP 139*  203* 140* 161* 150*    Discharge time spent: greater than 30 minutes.  Signed: MDALA-GAUSI, Harlo Fabela AGATHA, MD [facility] 03/25/2024

## 2024-03-25 NOTE — TOC Initial Note (Addendum)
 Transition of Care [facility]) - Initial/Assessment Note    Patient Details  Name: [name] MRN: [id number] Date of Birth: Oct 02, 1941  [facility] CM/SW Contact:    Cosimo Diones, RN Phone Number: 03/25/2024, 10:44 AM  Clinical Narrative:  Patient presented for shortness of breath. PTA patient states she was from home with her grandson. Patient states she has a girlfriend that takes her to her appointments. Patient has DME bedside commode, rolling walker, and oxygen  via [employer] at 6 Liters. Case Manager has called [employer] to confirm liter flow. Case Manager discussed home health PT/OT and the patient is agreeable to services- patient does not have an agency preference. Case Manager submitted referral to [facility] and they can accept the patient. Start of care to begin within 24-48 hours post transition home. No further home needs identified at this time. Case Manager will continue to follow for additional needs.                [employer] confirmed liter flow is 4 Liters with 5 Liter concentrator. Patient is currently on 6 liters. [employer] will need a new oxygen  order with liter flow. [employer] will deliver a 10 liter concentrator to the home. Patient states her son-in-law will provide transportation home.   Expected Discharge Plan: Home w Home Health Services Barriers to Discharge: Continued Medical Work up   Patient Goals and CMS Choice Patient states their goals for this hospitalization and ongoing recovery are:: plan to return home once stable   Choice offered to / list presented to : NA      Expected Discharge Plan and Services In-house Referral: NA   Post Acute Care Choice: Home Health Living arrangements for the past 2 months: Single Family Home                   DME Agency: NA       HH Arranged: PT, OT HH Agency: [facility] Date [facility] Agency Contacted: 03/25/24 Time HH Agency Contacted: 1044 Representative spoke with at [facility] Agency: Amy  Prior Living  Arrangements/Services Living arrangements for the past 2 months: Single Family Home Lives with:: Relatives (grandson) Patient language and need for interpreter reviewed:: Yes Do you feel safe going back to the place where you live?: Yes      Need for Family Participation in Patient Care: No (Comment) Care giver support system in place?: No (comment)   Criminal Activity/Legal Involvement Pertinent to Current Situation/Hospitalization: No - Comment as needed  Activities of Daily Living   ADL Screening (condition at time of admission) Independently performs ADLs?: Yes (appropriate for developmental age) Is the patient deaf or have difficulty hearing?: No Does the patient have difficulty seeing, even when wearing glasses/contacts?: No Does the patient have difficulty concentrating, remembering, or making decisions?: No  Permission Sought/Granted Permission sought to share information with : Family Supports, Magazine features editor, Case Estate manager/land agent granted to share information with : Yes, Verbal Permission Granted     Permission granted to share info w AGENCY: [facility]        Emotional Assessment Appearance:: Appears stated age Attitude/Demeanor/Rapport: Engaged Affect (typically observed): Appropriate Orientation: : Oriented to Self, Oriented to Place, Oriented to  Time, Oriented to Situation Alcohol / Substance Use: Not Applicable Psych Involvement: No (comment)  Admission diagnosis:  COPD exacerbation (HCC) [J44.1] Acute respiratory failure with hypoxia (HCC) [J96.01] Acute congestive heart failure, unspecified heart failure type [facility]) [I50.9] Patient Active Problem List   Diagnosis Date Noted  Acute respiratory failure with hypoxia (HCC) 03/22/2024   CKD (chronic kidney disease) stage 3, GFR 30-59 ml/min (HCC) 03/22/2024   Diabetes mellitus type 2 in nonobese (HCC) 03/22/2024   Transplanted kidney 08/08/2023   AKI (acute kidney injury) (HCC) 08/07/2023    Kidney transplant recipient 08/07/2023   COPD exacerbation (HCC) 08/06/2023   Acute on chronic hypoxic respiratory failure (HCC) 08/05/2023   History of renal transplantation 07/10/2022   Asymptomatic menopausal state 11/03/2021   Olecranon bursitis of right elbow 12/26/2019   Right arm cellulitis 12/18/2019   Fever    Hx of colonic polyps 09/30/2019   Family history of breast cancer    Family history of prostate cancer    Family history of lung cancer 08/08/2018   Shoulder pain, right 04/04/2018   Severe persistent asthma with (acute) exacerbation 02/13/2018   GERD (gastroesophageal reflux disease) 02/12/2018   Severe persistent asthma with acute exacerbation 02/12/2018   Complete rotator cuff tear of 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   Subacromial impingement of 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   Primary osteoarthritis, 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   History of methicillin resistant staphylococcus aureus (MRSA)    Benign neoplasm of colon 07/14/2014   Nausea & vomiting 12/22/2013   Dyspnea 12/22/2013   Diabetes (HCC) 02/28/2013   Preop cardiovascular exam    Type II or unspecified type diabetes mellitus without mention of complication, not stated as uncontrolled 06/05/2012   Encounter for long-term (current) use of other medications 02/26/2012   Type II or unspecified type diabetes mellitus with renal manifestations, not stated as uncontrolled(250.40) 02/26/2012   S/P kidney transplant    H/O steroid therapy    Anemia    Coronary artery disease    Hyperlipidemia    Hypertension    Aspirin  allergy    Hx of CABG    Ejection fraction    Anemia    GI bleed 11/26/2010   Pneumonia 08/20/2010   FOOT PAIN 01/20/2010   THROMBOCYTOPENIA 01/11/2009   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   GOUT 08/12/2007   Persistent asthma without complication 08/12/2007   MENOPAUSAL SYNDROME 08/12/2007   Osteoporosis 08/12/2007   VERTIGO 08/12/2007   PCP:  Claud Crumb, MD Pharmacy:    [id number] [place]ette [place]le, [place] - [address] [place] [address] Phone: [phone] Fax: [phone]     Social Drivers of Health (SDOH) Social History: SDOH Screenings   Food Insecurity: No Food Insecurity (03/22/2024)  Housing: Low Risk  (03/22/2024)  Transportation Needs: No Transportation Needs (03/22/2024)  Utilities: Not At Risk (03/22/2024)  Depression (PHQ2-9): Low Risk  (11/17/2022)  Financial Resource Strain: Low Risk  (02/19/2023)   Received from [facility]  Physical Activity: Sufficiently Active (08/09/2022)   Received from [facility], [facility]  Social Connections: Unknown (03/22/2024)  Stress: No Stress Concern Present (08/09/2022)   [facility], [facility]  Tobacco Use: Medium Risk (03/22/2024)   SDOH Interventions:     Readmission Risk Interventions     No data to display

## 2024-04-09 ENCOUNTER — Emergency Department (HOSPITAL_COMMUNITY)
Admission: EM | Admit: 2024-04-09 | Discharge: 2024-04-09 | Disposition: A | Discharge status: Home / Self Care | Attending: Emergency Medicine | Admitting: Emergency Medicine

## 2024-04-09 ENCOUNTER — Other Ambulatory Visit: Payer: Self-pay

## 2024-04-09 ENCOUNTER — Encounter (HOSPITAL_COMMUNITY): Payer: Self-pay

## 2024-04-09 ENCOUNTER — Emergency Department (HOSPITAL_COMMUNITY)

## 2024-04-09 DIAGNOSIS — E876 Hypokalemia: Secondary | ICD-10-CM | POA: Insufficient documentation

## 2024-04-09 DIAGNOSIS — R0602 Shortness of breath: Secondary | ICD-10-CM | POA: Diagnosis present

## 2024-04-09 DIAGNOSIS — J441 Chronic obstructive pulmonary disease with (acute) exacerbation: Secondary | ICD-10-CM | POA: Diagnosis not present

## 2024-04-09 DIAGNOSIS — Z7951 Long term (current) use of inhaled steroids: Secondary | ICD-10-CM | POA: Insufficient documentation

## 2024-04-09 DIAGNOSIS — Z7982 Long term (current) use of aspirin: Secondary | ICD-10-CM | POA: Insufficient documentation

## 2024-04-09 LAB — COMPREHENSIVE METABOLIC PANEL WITH GFR
ALT: 20 U/L (ref 0–44)
AST: 17 U/L (ref 15–41)
Albumin: 3.3 g/dL — ABNORMAL LOW (ref 3.5–5.0)
Alkaline Phosphatase: 47 U/L (ref 38–126)
Anion gap: 8 (ref 5–15)
BUN: 26 mg/dL — ABNORMAL HIGH (ref 8–23)
CO2: 24 mmol/L (ref 22–32)
Calcium: 9.1 mg/dL (ref 8.9–10.3)
Chloride: 106 mmol/L (ref 98–111)
Creatinine, Ser: 1.22 mg/dL — ABNORMAL HIGH (ref 0.44–1.00)
GFR, Estimated: 45 mL/min — ABNORMAL LOW (ref >60)
Glucose, Bld: 90 mg/dL (ref 70–99)
Potassium: 3.2 mmol/L — ABNORMAL LOW (ref 3.5–5.1)
Sodium: 138 mmol/L (ref 135–145)
Total Bilirubin: 0.6 mg/dL (ref 0.0–1.2)
Total Protein: 5.8 g/dL — ABNORMAL LOW (ref 6.5–8.1)

## 2024-04-09 LAB — CBC WITH DIFFERENTIAL/PLATELET
Abs Immature Granulocytes: 0.02 10*3/uL (ref 0.00–0.07)
Basophils Absolute: 0 10*3/uL (ref 0.0–0.1)
Basophils Relative: 0 %
Eosinophils Absolute: 0.1 10*3/uL (ref 0.0–0.5)
Eosinophils Relative: 2 %
HCT: 36.8 % (ref 36.0–46.0)
Hemoglobin: 12.4 g/dL (ref 12.0–15.0)
Immature Granulocytes: 0 %
Lymphocytes Relative: 27 %
Lymphs Abs: 1.9 10*3/uL (ref 0.7–4.0)
MCH: 24.4 pg — ABNORMAL LOW (ref 26.0–34.0)
MCHC: 33.7 g/dL (ref 30.0–36.0)
MCV: 72.3 fL — ABNORMAL LOW (ref 80.0–100.0)
Monocytes Absolute: 1 10*3/uL (ref 0.1–1.0)
Monocytes Relative: 14 %
Neutro Abs: 4 10*3/uL (ref 1.7–7.7)
Neutrophils Relative %: 57 %
Platelets: 133 10*3/uL — ABNORMAL LOW (ref 150–400)
RBC: 5.09 MIL/uL (ref 3.87–5.11)
RDW: 19.1 % — ABNORMAL HIGH (ref 11.5–15.5)
WBC: 7 10*3/uL (ref 4.0–10.5)
nRBC: 0 % (ref 0.0–0.2)

## 2024-04-09 LAB — RESP PANEL BY RT-PCR (RSV, FLU A&B, COVID)  RVPGX2
Influenza A by PCR: NEGATIVE
Influenza B by PCR: NEGATIVE
Resp Syncytial Virus by PCR: NEGATIVE
SARS Coronavirus 2 by RT PCR: NEGATIVE

## 2024-04-09 LAB — TROPONIN I (HIGH SENSITIVITY)
Troponin I (High Sensitivity): 21 ng/L — ABNORMAL HIGH (ref <18)
Troponin I (High Sensitivity): 20 ng/L — ABNORMAL HIGH (ref <18)

## 2024-04-09 LAB — BRAIN NATRIURETIC PEPTIDE: B Natriuretic Peptide: 622.9 pg/mL — ABNORMAL HIGH (ref 0.0–100.0)

## 2024-04-09 MED ORDER — ALBUTEROL SULFATE (2.5 MG/3ML) 0.083% IN NEBU
10.0000 mg/h | INHALATION_SOLUTION | Freq: Once | RESPIRATORY_TRACT | Status: AC
  Administered 2024-04-09: 10 mg/h via RESPIRATORY_TRACT
  Filled 2024-04-09: qty 12

## 2024-04-09 MED ORDER — PREDNISONE 20 MG PO TABS: ORAL_TABLET | ORAL | 0 refills | Status: DC

## 2024-04-09 MED ORDER — POTASSIUM CHLORIDE CRYS ER 20 MEQ PO TBCR
40.0000 meq | EXTENDED_RELEASE_TABLET | Freq: Once | ORAL | Status: AC
  Administered 2024-04-09: 40 meq via ORAL
  Filled 2024-04-09: qty 2

## 2024-04-09 NOTE — Discharge Instructions (Signed)
 Start the steroid prescription tomorrow as you were given the first dose in the emergency department.  Use your albuterol  inhaler or nebulizer every 4 hours for cough, shortness of breath, wheezing.  If you develop new or worsening shortness of breath, fever, coughing up blood, chest pain, or any other new/concerning symptoms then return to the ER.

## 2024-04-09 NOTE — ED Triage Notes (Signed)
 Pt presents to ED via EMS from [facility] with shortness of breath. EMS called for oxygen  sat in the 60s on pt regularly worn 6L [place]. Hx of kidney transplant 15 years ago. Duoneb given by EMS with minimal improvement in oxygen  saturation, CPAP placed. Pt arrived to ED with CPAP in place, oxygen  sat 100 on CPAP. Ascites noted, pt states she normally does not have that. Breath sounds coarse.   125 solumedrol given through 20 RFA

## 2024-04-09 NOTE — ED Provider Notes (Signed)
 [place] [facility] AT [facility] Provider Note   CSN: [id number] Arrival date & time: 04/09/24  1031     History  Chief Complaint  Patient presents with   Shortness of Breath    [name] is a 83 y.o. female.  HPI 83 year old female presents with respiratory distress.  History is from patient but also EMS.  She went to her nephrology appointment this morning and was short of breath and was having labored breathing.  His O2 sats were in the 60s despite her being on her chronic 6 L of oxygen .  EMS noted her to have increased work of breathing and gave her a DuoNeb but then also put her on BiPAP.  She received Solu-Medrol .  Patient feels like her shortness of breath is improving.  Patient tells me that her shortness of breath started today at the office.  Does feel like prior COPD.  She has had a cough for about 3 weeks. No chest pain or leg swelling.  Home Medications Prior to Admission medications   Medication Sig Start Date End Date Taking? Authorizing Provider  predniSONE  (DELTASONE ) 20 MG tablet 2 tabs po daily x 4 days 04/10/24  Yes Jerilynn Montenegro, MD  acetaminophen  (TYLENOL ) 650 MG CR tablet Take 650 mg by mouth every morning.    [provider]  albuterol  (PROVENTIL ) (2.5 MG/3ML) 0.083% nebulizer solution Take 2.5 mg by nebulization every 6 (six) hours as needed for wheezing or shortness of breath.    [provider]  albuterol  (VENTOLIN  HFA) 108 (90 Base) MCG/ACT inhaler Inhale 1-2 puffs into the lungs every 6 (six) hours as needed for wheezing or shortness of breath.    [provider]  amLODipine  (NORVASC ) 10 MG tablet Take 10 mg by mouth daily.     [provider]  aspirin  EC 81 MG tablet Take 81 mg by mouth daily.    [provider]  Azelastine  HCl 0.15 % SOLN Place 1 spray into both nostrils daily as needed for allergies.    [provider]  budesonide  (PULMICORT ) 0.25 MG/2ML nebulizer  solution Inhale 0.25 mg into the lungs as needed. 02/11/20   [provider]  calcitRIOL  (ROCALTROL ) 0.25 MCG capsule Take 0.25 mcg by mouth daily.     [provider]  ezetimibe  (ZETIA ) 10 MG tablet TAKE 1 TABLET BY MOUTH EVERY DAY 01/16/24   Nahser, Lela Purple, MD  fluticasone  (FLONASE ) 50 MCG/ACT nasal spray Place into both nostrils. 05/30/21   [provider]  Fluticasone -Umeclidin-Vilant (TRELEGY ELLIPTA) 200-62.5-25 MCG/ACT AEPB Inhale 1 puff into the lungs daily.    [provider]  isosorbide  mononitrate (IMDUR ) 30 MG 24 hr tablet TAKE 1 TABLET (30 MG TOTAL) BY MOUTH DAILY. PLEASE KEEP SCHEDULE APPOINTMENT FOR FUTURE REFILLS 01/16/24   Marlyse Single T, PA-C  KLOR-CON  M20 20 MEQ tablet TAKE 1 TABLET BY MOUTH EVERY DAY 11/22/23   Nahser, Lela Purple, MD  metoprolol  tartrate (LOPRESSOR ) 50 MG tablet TAKE 1 TABLET BY MOUTH TWICE A DAY 07/24/23   Nahser, Lela Purple, MD  nitroGLYCERIN  (NITROSTAT ) 0.4 MG SL tablet Place 1 tablet (0.4 mg total) under the tongue every 5 (five) minutes as needed for chest pain. 10/03/22   Nahser, Lela Purple, MD  omeprazole (PRILOSEC) 20 MG capsule Take 20 mg by mouth daily after breakfast.    [provider]  ONETOUCH VERIO test strip 1 EACH BY OTHER ROUTE DAILY IN THE AFTERNOON. USE AS INSTRUCTED 07/02/23   Shamleffer,  Ibtehal Jaralla, MD  tacrolimus  (PROGRAF ) 1 MG capsule Take 4 mg by mouth See admin instructions. Take 4 mg by mouth at 9 AM and 4 mg at 9 PM    [provider]  Tiotropium Bromide  Monohydrate 1.25 MCG/ACT AERS Inhale 1 puff into the lungs daily. 10/23/17   [provider]      Allergies    Sodium hypochlorite, Lactose, Asa arthritis strength-antacid [aspirin  buffered], Aspirin , Atorvastatin, Banana, Lactose intolerance (gi), and Lisinopril    Review of Systems   Review of Systems  Constitutional:  Negative for fever.  Respiratory:  Positive for cough and shortness of breath.   Cardiovascular:  Negative  for chest pain and leg swelling.  Gastrointestinal:  Negative for vomiting.    Physical Exam Updated Vital Signs BP (!) 160/70   Pulse (!) 103   Temp 98 F (36.7 C) (Oral)   Resp 18   Ht 5\' 2"  (1.575 m)   Wt 54.4 kg   LMP 03/04/2022   SpO2 95%   BMI 21.95 kg/m  Physical Exam Vitals and nursing note reviewed.  Constitutional:      Appearance: She is well-developed. She is not diaphoretic.  HENT:     Head: Normocephalic and atraumatic.  Cardiovascular:     Rate and Rhythm: Normal rate and regular rhythm.     Heart sounds: Normal heart sounds.  Pulmonary:     Effort: Pulmonary effort is normal. Tachypnea present.     Breath sounds: Wheezing present.  Abdominal:     Palpations: Abdomen is soft.     Tenderness: There is no abdominal tenderness.  Musculoskeletal:     Right lower leg: No edema.     Left lower leg: No edema.  Skin:    General: Skin is warm and dry.  Neurological:     Mental Status: She is alert.     ED Results / Procedures / Treatments   Labs (all labs ordered are listed, but only abnormal results are displayed) Labs Reviewed  COMPREHENSIVE METABOLIC PANEL WITH GFR - Abnormal; Notable for the following components:      Result Value   Potassium 3.2 (*)    BUN 26 (*)    Creatinine, Ser 1.22 (*)    Total Protein 5.8 (*)    Albumin 3.3 (*)    GFR, Estimated 45 (*)    All other components within normal limits  BRAIN NATRIURETIC PEPTIDE - Abnormal; Notable for the following components:   B Natriuretic Peptide 622.9 (*)    All other components within normal limits  CBC WITH DIFFERENTIAL/PLATELET - Abnormal; Notable for the following components:   MCV 72.3 (*)    MCH 24.4 (*)    RDW 19.1 (*)    Platelets 133 (*)    All other components within normal limits  TROPONIN I (HIGH SENSITIVITY) - Abnormal; Notable for the following components:   Troponin I (High Sensitivity) 21 (*)    All other components within normal limits  TROPONIN I (HIGH SENSITIVITY)  - Abnormal; Notable for the following components:   Troponin I (High Sensitivity) 20 (*)    All other components within normal limits  RESP PANEL BY RT-PCR (RSV, FLU A&B, COVID)  [id number]    EKG EKG Interpretation Date/Time:  Wednesday Apr 09 2024 10:38:35 EDT Ventricular Rate:  84 PR Interval:  161 QRS Duration:  107 QT Interval:  384 QTC Calculation: 454 R Axis:   47  Text Interpretation: Sinus rhythm Probable left atrial enlargement no  acute ST/T changes similar to Mar 22 2024 Confirmed by Jerilynn Montenegro [phone]) on 04/09/2024 10:51:09 AM  Radiology DG Chest Portable 1 View Result Date: 04/09/2024 CLINICAL DATA:  Respiratory distress. EXAM: PORTABLE CHEST 1 VIEW COMPARISON:  Mar 22, 2024. FINDINGS: Stable cardiomediastinal silhouette. Status post coronary artery bypass graft. Minimal bibasilar subsegmental atelectasis or scarring is noted. Bony thorax is unremarkable. IMPRESSION: Minimal bibasilar subsegmental atelectasis or scarring. Electronically Signed   By: Rosalene Colon M.D.   On: 04/09/2024 13:00    Procedures Procedures    Medications Ordered in ED Medications  albuterol  (PROVENTIL ) (2.5 MG/3ML) 0.083% nebulizer solution (10 mg/hr Nebulization Given 04/09/24 1056)  potassium chloride  SA (KLOR-CON  M) CR tablet 40 mEq (40 mEq Oral Given 04/09/24 1250)    ED Course/ Medical Decision Making/ A&P                                 Medical Decision Making Amount and/or Complexity of Data Reviewed External Data Reviewed: notes. Labs: ordered.    Details: Mild hypokalemia.  Troponins minimally elevated but flat.  Normal WBC. Radiology: ordered and independent interpretation performed.    Details: No pneumonia. ECG/medicine tests: ordered and independent interpretation performed.    Details: Similar to baseline.  Risk Prescription drug management.   Patient presents with COPD exacerbation.  She has increased work of breathing but was able to be taken off BiPAP and doing  okay.  Does have increased wheezing and coarse breath sounds so albuterol  treatments were given.  After continuous she was observed for a few hours and doing well.  She feels like her breathing is back to her baseline.  She has a little bit of tachypnea but states this is normal.  She is saturating well on her typical 6 L.  Given that her symptoms are now resolved I suspect that her hypoxia and shortness of breath were from the COPD exacerbation.  I doubt PE, occult pneumonia, etc.  Troponins are flat and minimally elevated and I suspect this is not related to ACS.  BNP is a little elevated but has been so before and there is no CHF on exam or chest x-ray.  She feels well after discharge, will give a prednisone  burst, and discharge with return precautions.        Final Clinical Impression(s) / ED Diagnoses Final diagnoses:  COPD exacerbation (HCC)    Rx / DC Orders ED Discharge Orders          Ordered    predniSONE  (DELTASONE ) 20 MG tablet        04/09/24 1415              Jerilynn Montenegro, MD 04/09/24 1505

## 2024-04-12 ENCOUNTER — Other Ambulatory Visit: Payer: Self-pay | Admitting: Physician Assistant

## 2024-04-17 ENCOUNTER — Other Ambulatory Visit: Payer: Self-pay

## 2024-04-17 ENCOUNTER — Inpatient Hospital Stay (HOSPITAL_BASED_OUTPATIENT_CLINIC_OR_DEPARTMENT_OTHER)
Admission: EM | Admit: 2024-04-17 | Discharge: 2024-04-22 | DRG: 291 | Disposition: A | Discharge status: Home Health | Attending: Internal Medicine | Admitting: Internal Medicine

## 2024-04-17 ENCOUNTER — Encounter (HOSPITAL_BASED_OUTPATIENT_CLINIC_OR_DEPARTMENT_OTHER): Payer: Self-pay | Admitting: Emergency Medicine

## 2024-04-17 ENCOUNTER — Emergency Department (HOSPITAL_BASED_OUTPATIENT_CLINIC_OR_DEPARTMENT_OTHER)

## 2024-04-17 DIAGNOSIS — Z96642 Presence of left artificial hip joint: Secondary | ICD-10-CM | POA: Diagnosis present

## 2024-04-17 DIAGNOSIS — I2729 Other secondary pulmonary hypertension: Secondary | ICD-10-CM | POA: Diagnosis present

## 2024-04-17 DIAGNOSIS — Z7409 Other reduced mobility: Secondary | ICD-10-CM | POA: Diagnosis present

## 2024-04-17 DIAGNOSIS — E213 Hyperparathyroidism, unspecified: Secondary | ICD-10-CM | POA: Diagnosis present

## 2024-04-17 DIAGNOSIS — Z992 Dependence on renal dialysis: Secondary | ICD-10-CM

## 2024-04-17 DIAGNOSIS — Z940 Kidney transplant status: Secondary | ICD-10-CM

## 2024-04-17 DIAGNOSIS — I132 Hypertensive heart and chronic kidney disease with heart failure and with stage 5 chronic kidney disease, or end stage renal disease: Principal | ICD-10-CM | POA: Diagnosis present

## 2024-04-17 DIAGNOSIS — Z8614 Personal history of Methicillin resistant Staphylococcus aureus infection: Secondary | ICD-10-CM

## 2024-04-17 DIAGNOSIS — Z951 Presence of aortocoronary bypass graft: Secondary | ICD-10-CM

## 2024-04-17 DIAGNOSIS — J9621 Acute and chronic respiratory failure with hypoxia: Secondary | ICD-10-CM | POA: Diagnosis present

## 2024-04-17 DIAGNOSIS — N186 End stage renal disease: Secondary | ICD-10-CM | POA: Diagnosis present

## 2024-04-17 DIAGNOSIS — Z8719 Personal history of other diseases of the digestive system: Secondary | ICD-10-CM

## 2024-04-17 DIAGNOSIS — Z888 Allergy status to other drugs, medicaments and biological substances status: Secondary | ICD-10-CM

## 2024-04-17 DIAGNOSIS — R0602 Shortness of breath: Secondary | ICD-10-CM | POA: Diagnosis not present

## 2024-04-17 DIAGNOSIS — Z8701 Personal history of pneumonia (recurrent): Secondary | ICD-10-CM

## 2024-04-17 DIAGNOSIS — Z66 Do not resuscitate: Secondary | ICD-10-CM | POA: Diagnosis present

## 2024-04-17 DIAGNOSIS — D696 Thrombocytopenia, unspecified: Secondary | ICD-10-CM | POA: Diagnosis present

## 2024-04-17 DIAGNOSIS — Z87891 Personal history of nicotine dependence: Secondary | ICD-10-CM

## 2024-04-17 DIAGNOSIS — J45909 Unspecified asthma, uncomplicated: Secondary | ICD-10-CM | POA: Diagnosis present

## 2024-04-17 DIAGNOSIS — R0689 Other abnormalities of breathing: Secondary | ICD-10-CM | POA: Diagnosis not present

## 2024-04-17 DIAGNOSIS — I252 Old myocardial infarction: Secondary | ICD-10-CM

## 2024-04-17 DIAGNOSIS — Z79899 Other long term (current) drug therapy: Secondary | ICD-10-CM

## 2024-04-17 DIAGNOSIS — I5032 Chronic diastolic (congestive) heart failure: Secondary | ICD-10-CM | POA: Diagnosis present

## 2024-04-17 DIAGNOSIS — R0609 Other forms of dyspnea: Secondary | ICD-10-CM

## 2024-04-17 DIAGNOSIS — E1122 Type 2 diabetes mellitus with diabetic chronic kidney disease: Secondary | ICD-10-CM | POA: Diagnosis present

## 2024-04-17 DIAGNOSIS — R0902 Hypoxemia: Secondary | ICD-10-CM | POA: Diagnosis present

## 2024-04-17 DIAGNOSIS — J439 Emphysema, unspecified: Secondary | ICD-10-CM | POA: Diagnosis present

## 2024-04-17 DIAGNOSIS — M19012 Primary osteoarthritis, left shoulder: Secondary | ICD-10-CM | POA: Diagnosis present

## 2024-04-17 DIAGNOSIS — Z7401 Bed confinement status: Secondary | ICD-10-CM | POA: Diagnosis not present

## 2024-04-17 DIAGNOSIS — J9601 Acute respiratory failure with hypoxia: Secondary | ICD-10-CM | POA: Diagnosis present

## 2024-04-17 DIAGNOSIS — R001 Bradycardia, unspecified: Secondary | ICD-10-CM | POA: Diagnosis not present

## 2024-04-17 DIAGNOSIS — R911 Solitary pulmonary nodule: Secondary | ICD-10-CM | POA: Diagnosis present

## 2024-04-17 DIAGNOSIS — Z91018 Allergy to other foods: Secondary | ICD-10-CM

## 2024-04-17 DIAGNOSIS — Z7951 Long term (current) use of inhaled steroids: Secondary | ICD-10-CM

## 2024-04-17 DIAGNOSIS — Z9981 Dependence on supplemental oxygen: Secondary | ICD-10-CM

## 2024-04-17 DIAGNOSIS — Z1152 Encounter for screening for COVID-19: Secondary | ICD-10-CM

## 2024-04-17 DIAGNOSIS — M810 Age-related osteoporosis without current pathological fracture: Secondary | ICD-10-CM | POA: Diagnosis present

## 2024-04-17 DIAGNOSIS — Z7982 Long term (current) use of aspirin: Secondary | ICD-10-CM

## 2024-04-17 DIAGNOSIS — Z886 Allergy status to analgesic agent status: Secondary | ICD-10-CM

## 2024-04-17 DIAGNOSIS — I2781 Cor pulmonale (chronic): Secondary | ICD-10-CM | POA: Diagnosis present

## 2024-04-17 DIAGNOSIS — Z79621 Long term (current) use of calcineurin inhibitor: Secondary | ICD-10-CM

## 2024-04-17 DIAGNOSIS — J4489 Other specified chronic obstructive pulmonary disease: Secondary | ICD-10-CM | POA: Diagnosis present

## 2024-04-17 DIAGNOSIS — J441 Chronic obstructive pulmonary disease with (acute) exacerbation: Secondary | ICD-10-CM | POA: Diagnosis present

## 2024-04-17 DIAGNOSIS — Z90710 Acquired absence of both cervix and uterus: Secondary | ICD-10-CM

## 2024-04-17 DIAGNOSIS — I10 Essential (primary) hypertension: Secondary | ICD-10-CM | POA: Diagnosis present

## 2024-04-17 DIAGNOSIS — D649 Anemia, unspecified: Secondary | ICD-10-CM | POA: Diagnosis present

## 2024-04-17 DIAGNOSIS — M109 Gout, unspecified: Secondary | ICD-10-CM | POA: Diagnosis present

## 2024-04-17 DIAGNOSIS — I2510 Atherosclerotic heart disease of native coronary artery without angina pectoris: Secondary | ICD-10-CM | POA: Diagnosis present

## 2024-04-17 DIAGNOSIS — E785 Hyperlipidemia, unspecified: Secondary | ICD-10-CM | POA: Diagnosis present

## 2024-04-17 DIAGNOSIS — R262 Difficulty in walking, not elsewhere classified: Secondary | ICD-10-CM | POA: Diagnosis present

## 2024-04-17 DIAGNOSIS — E739 Lactose intolerance, unspecified: Secondary | ICD-10-CM | POA: Diagnosis present

## 2024-04-17 DIAGNOSIS — K219 Gastro-esophageal reflux disease without esophagitis: Secondary | ICD-10-CM | POA: Diagnosis present

## 2024-04-17 LAB — CBC
HCT: 36.9 % (ref 36.0–46.0)
Hemoglobin: 12.5 g/dL (ref 12.0–15.0)
MCH: 24.2 pg — ABNORMAL LOW (ref 26.0–34.0)
MCHC: 33.9 g/dL (ref 30.0–36.0)
MCV: 71.5 fL — ABNORMAL LOW (ref 80.0–100.0)
Platelets: 160 10*3/uL (ref 150–400)
RBC: 5.16 MIL/uL — ABNORMAL HIGH (ref 3.87–5.11)
RDW: 18.8 % — ABNORMAL HIGH (ref 11.5–15.5)
WBC: 7.1 10*3/uL (ref 4.0–10.5)
nRBC: 0 % (ref 0.0–0.2)

## 2024-04-17 LAB — I-STAT VENOUS BLOOD GAS, ED
Acid-base deficit: 3 mmol/L — ABNORMAL HIGH (ref 0.0–2.0)
Bicarbonate: 22.1 mmol/L (ref 20.0–28.0)
Calcium, Ion: 1.26 mmol/L (ref 1.15–1.40)
HCT: 38 % (ref 36.0–46.0)
Hemoglobin: 12.9 g/dL (ref 12.0–15.0)
O2 Saturation: 68 %
Potassium: 4.5 mmol/L (ref 3.5–5.1)
Sodium: 137 mmol/L (ref 135–145)
TCO2: 23 mmol/L (ref 22–32)
pCO2, Ven: 39.9 mmHg — ABNORMAL LOW (ref 44–60)
pH, Ven: 7.353 (ref 7.25–7.43)
pO2, Ven: 37 mmHg (ref 32–45)

## 2024-04-17 LAB — RESP PANEL BY RT-PCR (RSV, FLU A&B, COVID)  RVPGX2
Influenza A by PCR: NEGATIVE
Influenza B by PCR: NEGATIVE
Resp Syncytial Virus by PCR: NEGATIVE
SARS Coronavirus 2 by RT PCR: NEGATIVE

## 2024-04-17 LAB — BASIC METABOLIC PANEL WITH GFR
Anion gap: 15 (ref 5–15)
BUN: 33 mg/dL — ABNORMAL HIGH (ref 8–23)
CO2: 21 mmol/L — ABNORMAL LOW (ref 22–32)
Calcium: 9.8 mg/dL (ref 8.9–10.3)
Chloride: 103 mmol/L (ref 98–111)
Creatinine, Ser: 1.52 mg/dL — ABNORMAL HIGH (ref 0.44–1.00)
GFR, Estimated: 34 mL/min — ABNORMAL LOW (ref >60)
Glucose, Bld: 161 mg/dL — ABNORMAL HIGH (ref 70–99)
Potassium: 4.6 mmol/L (ref 3.5–5.1)
Sodium: 139 mmol/L (ref 135–145)

## 2024-04-17 LAB — TROPONIN T, HIGH SENSITIVITY
Troponin T High Sensitivity: 21 ng/L — ABNORMAL HIGH (ref <19)
Troponin T High Sensitivity: 20 ng/L — ABNORMAL HIGH (ref <19)

## 2024-04-17 LAB — PRO BRAIN NATRIURETIC PEPTIDE: Pro Brain Natriuretic Peptide: 4136 pg/mL — ABNORMAL HIGH (ref <300.0)

## 2024-04-17 MED ORDER — IPRATROPIUM-ALBUTEROL 0.5-2.5 (3) MG/3ML IN SOLN
3.0000 mL | Freq: Once | RESPIRATORY_TRACT | Status: AC
  Administered 2024-04-17: 3 mL via RESPIRATORY_TRACT

## 2024-04-17 MED ORDER — IPRATROPIUM-ALBUTEROL 0.5-2.5 (3) MG/3ML IN SOLN
RESPIRATORY_TRACT | Status: AC
  Filled 2024-04-17: qty 3

## 2024-04-17 NOTE — ED Triage Notes (Signed)
 SENT FROM [facility]. Sob spo2 69% Hx chf and HD Chest tighness

## 2024-04-17 NOTE — H&P (Signed)
 History and Physical    DENISSA COZART GEX:[id number] DOB: 25-Aug-1941 DOA: 04/17/2024  Patient coming from: Home.  Chief Complaint: Shortness of breath.  HPI: [name] is a 83 y.o. female with history of CAD status post CABG, renal transplant, hypertension, asthma/COPD, GERD presents to the ER with complaints of exertional shortness of breath.  Patient was admitted to the hospital on 03/22/2024 with complaints of shortness of breath at that time it was felt the patient's symptoms were likely related to combination of CHF and COPD.  Patient was discharged on 03/25/2024 and patient states since discharge her shortness of breath has been worsening on exertion.  Patient also has some symptoms of orthopnea.  Denies chest pain productive cough fever or chills.  ED Course: In the ER chest x-ray did not show anything acute.  Labs show elevated BNP.  Patient is chronically on 6 L oxygen .  Troponins were negative.  Patient was given DuoNebs and admitted for exertional dyspnea.  Review of Systems: As per HPI, rest all negative.   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, hold ASA 2-3 weeks - consider plavix instead of ASA   Asthma    Cellulitis 12/19/2019   right upper arm   Coronary artery disease    cath 11/2007, grafts patent /  Nuclear, June, 2011, prior inferior MI with mild peri-infarct ischemia, anterior breast attenuation, EF 67%, done for renal transplant assessment / Low level exercise/Lexiscan  Myoview  (11/2013): EF 67%, no ischemi; normal study   Diabetes mellitus    type 2   ESRD on dialysis [facility])    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, 2012   Family history of breast cancer    Family history of prostate cancer    GERD (gastroesophageal reflux disease)    GI bleed 11/26/2010   January, 2012 , AVM   Gout    History of methicillin resistant staphylococcus aureus (MRSA)    Hyperlipidemia    Hyperparathyroidism    Hypertension     Myocardial infarction [facility])    Osteoporosis    Pneumonia 08/2010    Hospitalization, October, 2011   Primary osteoarthritis, 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   S/P kidney transplant    September, 2012, [facility]   Subacromial impingement of 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    Past Surgical History:  Procedure Laterality Date   ABDOMINAL HYSTERECTOMY  1980'S   ARTERIOVENOUS GRAFT PLACEMENT Left 03/04/2007   forearm   BIOPSY  09/30/2019   Procedure: BIOPSY;  Surgeon: Baldo Bonds, MD;  Location: [facility];  Service: Endoscopy;;   BIOPSY  12/27/2021   Procedure: BIOPSY;  Surgeon: Baldo Bonds, MD;  Location: [facility];  Service: Endoscopy;;   BREAST EXCISIONAL BIOPSY Right    benign more than 10 yr ago   BREAST SURGERY  YRS AGO   RT BREAST CYST REMOVED    CARDIAC CATHETERIZATION  06/03/2002; 12/04/2007   COLONOSCOPY WITH PROPOFOL  N/A 07/14/2014   Procedure: COLONOSCOPY WITH PROPOFOL ;  Surgeon: Yvetta Herbert, MD;  Location: [facility];  Service: Endoscopy;  Laterality: N/A;   COLONOSCOPY WITH PROPOFOL  N/A 09/30/2019   Procedure: COLONOSCOPY WITH PROPOFOL ;  Surgeon: Baldo Bonds, MD;  Location: [facility];  Service: Endoscopy;  Laterality: N/A;   COLONOSCOPY WITH PROPOFOL  N/A 12/27/2021   Procedure: COLONOSCOPY WITH PROPOFOL ;  Surgeon: Baldo Bonds, MD;  Location: [facility];  Service: Endoscopy;  Laterality: N/A;   CORONARY ARTERY BYPASS GRAFT  06/06/2002  x 4   ESOPHAGOGASTRODUODENOSCOPY N/A 12/27/2021   Procedure: ESOPHAGOGASTRODUODENOSCOPY (EGD);  Surgeon: Baldo Bonds, MD;  Location: [facility];  Service: Endoscopy;  Laterality: N/A;   HEMOSTASIS CLIP PLACEMENT  12/27/2021   Procedure: HEMOSTASIS CLIP PLACEMENT;  Surgeon: Baldo Bonds, MD;  Location: [facility];  Service: Endoscopy;;   HOT HEMOSTASIS N/A 07/14/2014   Procedure: HOT HEMOSTASIS (ARGON PLASMA COAGULATION/BICAP);  Surgeon: Yvetta Herbert, MD;  Location: [facility];  Service: Endoscopy;   Laterality: N/A;   IR FLUORO GUIDE CV LINE RIGHT  12/22/2019   IR US  GUIDE VASC ACCESS RIGHT  12/22/2019   KIDNEY TRANSPLANT Right 08/04/2011   ORIF PATELLA Left 04/06/2016   Procedure: OPEN REDUCTION INTERNAL (ORIF) LEFT  PATELLA;  Surgeon: Ferd Householder, MD;  Location: [facility];  Service: Orthopedics;  Laterality: Left;   POLYPECTOMY  09/30/2019   Procedure: POLYPECTOMY;  Surgeon: Baldo Bonds, MD;  Location: [facility];  Service: Endoscopy;;   POLYPECTOMY  12/27/2021   Procedure: POLYPECTOMY;  Surgeon: Baldo Bonds, MD;  Location: [facility];  Service: Endoscopy;;   SHOULDER ARTHROSCOPY WITH ROTATOR CUFF REPAIR AND SUBACROMIAL DECOMPRESSION Left 08/03/2016   Procedure: LEFT SHOULDER ARTHROSCOPY WITH ROTATOR CUFF REPAIR AND SUBACROMIAL DECOMPRESSION with distal claviculectomy and extentsive debridement;  Surgeon: Ferd Householder, MD;  Location: [facility];  Service: Orthopedics;  Laterality: Left;  Block   THROMBECTOMY AND REVISION OF ARTERIOVENTOUS (AV) GORETEX  GRAFT Left 05/08/2007   forearm   TOTAL HIP ARTHROPLASTY  12/18/2012   Procedure: TOTAL HIP ARTHROPLASTY;  Surgeon: Ferd Householder, MD;  [facility]: [facility];  Service: Orthopedics;  Laterality: Left;   VESICOVAGINAL FISTULA CLOSURE W/ TAH  1984     reports that she quit smoking about 40 years ago. Her smoking use included cigarettes. She started smoking about 48 years ago. She has a 8 pack-year smoking history. She has never used smokeless tobacco. She reports that she does not drink alcohol and does not use drugs.  Allergies  Allergen Reactions   Sodium Hypochlorite Shortness Of Breath   Lactose Nausea And Vomiting   Asa Arthritis Strength-Antacid [Aspirin  Buffered] Nausea And Vomiting and Other (See Comments)    STOMACH BURNS, also   Aspirin  Nausea And Vomiting    Per pt. "can tolerate the enteric coated tablets".    Atorvastatin Other (See Comments)    Patient's skin was skin was  sensitive   Banana Nausea And Vomiting   Lactose Intolerance (Gi) Nausea And Vomiting   Lisinopril Cough    Family History  Problem Relation Age of Onset   Prostate cancer Brother    Lung cancer Brother    Cancer Neg Hx     Prior to Admission medications   Medication Sig Start Date End Date Taking? Authorizing Provider  acetaminophen  (TYLENOL ) 650 MG CR tablet Take 650 mg by mouth every morning.    [provider]  albuterol  (PROVENTIL ) (2.5 MG/3ML) 0.083% nebulizer solution Take 2.5 mg by nebulization every 6 (six) hours as needed for wheezing or shortness of breath.    [provider]  albuterol  (VENTOLIN  HFA) 108 (90 Base) MCG/ACT inhaler Inhale 1-2 puffs into the lungs every 6 (six) hours as needed for wheezing or shortness of breath.    [provider]  amLODipine  (NORVASC ) 10 MG tablet Take 10 mg by mouth daily.     [provider]  aspirin  EC 81 MG tablet Take 81 mg by mouth daily.    [provider]  Azelastine  HCl 0.15 % SOLN Place 1 spray into both nostrils daily as needed for allergies.    [provider]  budesonide  (PULMICORT ) 0.25 MG/2ML nebulizer solution Inhale 0.25 mg into the lungs as needed. 02/11/20   [provider]  calcitRIOL  (ROCALTROL ) 0.25 MCG capsule Take 0.25 mcg by mouth daily.     [provider]  ezetimibe  (ZETIA ) 10 MG tablet TAKE 1 TABLET BY MOUTH EVERY DAY 01/16/24   Nahser, Lela Purple, MD  fluticasone  (FLONASE ) 50 MCG/ACT nasal spray Place into both nostrils. 05/30/21   [provider]  Fluticasone -Umeclidin-Vilant (TRELEGY ELLIPTA) 200-62.5-25 MCG/ACT AEPB Inhale 1 puff into the lungs daily.    [provider]  isosorbide  mononitrate (IMDUR ) 30 MG 24 hr tablet Take 1 tablet (30 mg total) by mouth daily. 04/15/24   Nahser, Lela Purple, MD  KLOR-CON  M20 20 MEQ tablet TAKE 1 TABLET BY MOUTH EVERY DAY 11/22/23   Nahser, Lela Purple, MD  metoprolol  tartrate (LOPRESSOR ) 50 MG tablet  TAKE 1 TABLET BY MOUTH TWICE A DAY 07/24/23   Nahser, Lela Purple, MD  nitroGLYCERIN  (NITROSTAT ) 0.4 MG SL tablet Place 1 tablet (0.4 mg total) under the tongue every 5 (five) minutes as needed for chest pain. 10/03/22   Nahser, Lela Purple, MD  omeprazole (PRILOSEC) 20 MG capsule Take 20 mg by mouth daily after breakfast.    [provider]  ONETOUCH VERIO test strip 1 EACH BY OTHER ROUTE DAILY IN THE AFTERNOON. USE AS INSTRUCTED 07/02/23   Shamleffer, Ibtehal Jaralla, MD  predniSONE  (DELTASONE ) 20 MG tablet 2 tabs po daily x 4 days 04/10/24   Jerilynn Montenegro, MD  tacrolimus  (PROGRAF ) 1 MG capsule Take 4 mg by mouth See admin instructions. Take 4 mg by mouth at 9 AM and 4 mg at 9 PM    [provider]  Tiotropium Bromide  Monohydrate 1.25 MCG/ACT AERS Inhale 1 puff into the lungs daily. 10/23/17   [provider]    Physical Exam: Constitutional: Moderately built and nourished. Vitals:   04/17/24 2200 04/17/24 2252 04/17/24 2257 04/17/24 2301  BP:  (!) 156/61    Pulse:  60    Resp:  20 15   Temp:  97.7 F (36.5 C)    TempSrc:  Oral    SpO2:  97%    Weight: 55.3 kg   54.9 kg  Height: 5\' 2"  (1.575 m)   5\' 2"  (1.575 m)   Eyes: Anicteric no pallor. ENMT: No discharge from the ears/nose or mouth. Neck: No mass felt.  No neck rigidity. Respiratory: No rhonchi or crepitations. Cardiovascular: S1 S2 heard. Abdomen: Soft nontender bowel sound present. Musculoskeletal: No edema. Skin: No rash. Neurologic: Alert awake oriented to time place and person.  Moves all extremities. Psychiatric: Appear normal.  Normal affect.   Labs on Admission: I have personally reviewed following labs and imaging studies  CBC: Recent Labs  Lab 04/17/24 1646 04/17/24 1650  WBC 7.1  --   HGB 12.5 12.9  HCT 36.9 38.0  MCV 71.5*  --   PLT 160  --    Basic Metabolic Panel: Recent Labs  Lab 04/17/24 1646 04/17/24 1650  NA 139 137  K 4.6 4.5  CL 103  --   CO2 21*  --   GLUCOSE 161*   --   BUN 33*  --   CREATININE 1.52*  --   CALCIUM  9.8  --    GFR: Estimated Creatinine Clearance: 23 mL/min (A) (by C-G formula based  on SCr of 1.52 mg/dL (H)). Liver Function Tests: No results for input(s): "AST", "ALT", "ALKPHOS", "BILITOT", "PROT", "ALBUMIN" in the last 168 hours. No results for input(s): "LIPASE", "AMYLASE" in the last 168 hours. No results for input(s): "AMMONIA" in the last 168 hours. Coagulation Profile: No results for input(s): "INR", "PROTIME" in the last 168 hours. Cardiac Enzymes: No results for input(s): "CKTOTAL", "CKMB", "CKMBINDEX", "TROPONINI" in the last 168 hours. BNP (last 3 results) Recent Labs    04/17/24 1646  PROBNP 4,136.0*   HbA1C: No results for input(s): "HGBA1C" in the last 72 hours. CBG: No results for input(s): "GLUCAP" in the last 168 hours. Lipid Profile: No results for input(s): "CHOL", "HDL", "LDLCALC", "TRIG", "CHOLHDL", "LDLDIRECT" in the last 72 hours. Thyroid  Function Tests: No results for input(s): "TSH", "T4TOTAL", "FREET4", "T3FREE", "THYROIDAB" in the last 72 hours. Anemia Panel: No results for input(s): "VITAMINB12", "FOLATE", "FERRITIN", "TIBC", "IRON", "RETICCTPCT" in the last 72 hours. Urine analysis:    Component Value Date/Time   COLORURINE STRAW (A) 08/07/2023 0912   APPEARANCEUR CLEAR 08/07/2023 0912   LABSPEC 1.013 08/07/2023 0912   PHURINE 5.0 08/07/2023 0912   GLUCOSEU NEGATIVE 08/07/2023 0912   GLUCOSEU NEGATIVE 06/21/2012 0819   HGBUR SMALL (A) 08/07/2023 0912   BILIRUBINUR NEGATIVE 08/07/2023 0912   KETONESUR NEGATIVE 08/07/2023 0912   PROTEINUR 100 (A) 08/07/2023 0912   UROBILINOGEN 0.2 12/10/2012 1056   NITRITE NEGATIVE 08/07/2023 0912   LEUKOCYTESUR NEGATIVE 08/07/2023 0912   Sepsis Labs: @LABRCNTIP (procalcitonin:4,lacticidven:4) ) Recent Results (from the past 240 hours)  Resp panel by RT-PCR (RSV, Flu A&B, Covid) Anterior Nasal Swab     Status: None   Collection Time: 04/09/24 10:38 AM    Specimen: Anterior Nasal Swab  Result Value Ref Range Status   SARS Coronavirus 2 by RT PCR NEGATIVE NEGATIVE Final   Influenza A by PCR NEGATIVE NEGATIVE Final   Influenza B by PCR NEGATIVE NEGATIVE Final    Comment: (NOTE) The Xpert Xpress SARS-CoV-2/FLU/RSV plus assay is intended as an aid in the diagnosis of influenza from Nasopharyngeal swab specimens and should not be used as a sole basis for treatment. Nasal washings and aspirates are unacceptable for Xpert Xpress SARS-CoV-2/FLU/RSV testing.  Fact Sheet for Patients: BloggerCourse.com  Fact Sheet for Healthcare Providers: SeriousBroker.it  This test is not yet approved or cleared by the [place]  FDA and has been authorized for detection and/or diagnosis of SARS-CoV-2 by FDA under an Emergency Use Authorization (EUA). This EUA will remain in effect (meaning this test can be used) for the duration of the COVID-19 declaration under Section 564(b)(1) of the Act, 21 U.S.C. section 360bbb-3(b)(1), unless the authorization is terminated or revoked.     Resp Syncytial Virus by PCR NEGATIVE NEGATIVE Final    Comment: (NOTE) Fact Sheet for Patients: BloggerCourse.com  Fact Sheet for Healthcare Providers: SeriousBroker.it  This test is not yet approved or cleared by the [place]  FDA and has been authorized for detection and/or diagnosis of SARS-CoV-2 by FDA under an Emergency Use Authorization (EUA). This EUA will remain in effect (meaning this test can be used) for the duration of the COVID-19 declaration under Section 564(b)(1) of the Act, 21 U.S.C. section 360bbb-3(b)(1), unless the authorization is terminated or revoked.  Performed at [facility], [address], [place], [place] [address]   Resp panel by RT-PCR (RSV, Flu A&B, Covid) Anterior Nasal Swab     Status: None   Collection Time: 04/17/24  5:08  PM  Specimen: Anterior Nasal Swab  Result Value Ref Range Status   SARS Coronavirus 2 by RT PCR NEGATIVE NEGATIVE Final    Comment: (NOTE) SARS-CoV-2 target nucleic acids are NOT DETECTED.  The SARS-CoV-2 RNA is generally detectable in upper respiratory specimens during the acute phase of infection. The lowest concentration of SARS-CoV-2 viral copies this assay can detect is 138 copies/mL. A negative result does not preclude SARS-Cov-2 infection and should not be used as the sole basis for treatment or other patient management decisions. A negative result may occur with  improper specimen collection/handling, submission of specimen other than nasopharyngeal swab, presence of viral mutation(s) within the areas targeted by this assay, and inadequate number of viral copies(<138 copies/mL). A negative result must be combined with clinical observations, patient history, and epidemiological information. The expected result is Negative.  Fact Sheet for Patients:  BloggerCourse.com  Fact Sheet for Healthcare Providers:  SeriousBroker.it  This test is no t yet approved or cleared by the [place]  FDA and  has been authorized for detection and/or diagnosis of SARS-CoV-2 by FDA under an Emergency Use Authorization (EUA). This EUA will remain  in effect (meaning this test can be used) for the duration of the COVID-19 declaration under Section 564(b)(1) of the Act, 21 U.S.C.section 360bbb-3(b)(1), unless the authorization is terminated  or revoked sooner.       Influenza A by PCR NEGATIVE NEGATIVE Final   Influenza B by PCR NEGATIVE NEGATIVE Final    Comment: (NOTE) The Xpert Xpress SARS-CoV-2/FLU/RSV plus assay is intended as an aid in the diagnosis of influenza from Nasopharyngeal swab specimens and should not be used as a sole basis for treatment. Nasal washings and aspirates are unacceptable for Xpert Xpress  SARS-CoV-2/FLU/RSV testing.  Fact Sheet for Patients: BloggerCourse.com  Fact Sheet for Healthcare Providers: SeriousBroker.it  This test is not yet approved or cleared by the [place]  FDA and has been authorized for detection and/or diagnosis of SARS-CoV-2 by FDA under an Emergency Use Authorization (EUA). This EUA will remain in effect (meaning this test can be used) for the duration of the COVID-19 declaration under Section 564(b)(1) of the Act, 21 U.S.C. section 360bbb-3(b)(1), unless the authorization is terminated or revoked.     Resp Syncytial Virus by PCR NEGATIVE NEGATIVE Final    Comment: (NOTE) Fact Sheet for Patients: BloggerCourse.com  Fact Sheet for Healthcare Providers: SeriousBroker.it  This test is not yet approved or cleared by the [place]  FDA and has been authorized for detection and/or diagnosis of SARS-CoV-2 by FDA under an Emergency Use Authorization (EUA). This EUA will remain in effect (meaning this test can be used) for the duration of the COVID-19 declaration under Section 564(b)(1) of the Act, 21 U.S.C. section 360bbb-3(b)(1), unless the authorization is terminated or revoked.  Performed at [facility], [address], [place], [place] [address]      Radiological Exams on Admission: DG Chest Portable 1 View Result Date: 04/17/2024 CLINICAL DATA:  Shortness of breath EXAM: PORTABLE CHEST 1 VIEW COMPARISON:  04/09/2024 FINDINGS: Prior CABG. Heart and mediastinal contours within normal limits. Aortic atherosclerosis. No confluent airspace opacities or effusions. No acute bony abnormality. IMPRESSION: No active disease. Electronically Signed   By: Janeece Mechanic M.D.   On: 04/17/2024 19:05    EKG: Independently reviewed.  Sinus rhythm with nonspecific ST changes.  Assessment/Plan Principal Problem:   Acute on chronic  respiratory failure with hypoxia (HCC) Active Problems:   GOUT   Hx of CABG  S/P kidney transplant    Exertional dyspnea -    cause not clear.  BNP is elevated I have ordered 1 dose of Lasix  20 mg.  Will continue with patient's home inhaler Breztri along with as needed albuterol .  Recent 2D echo done this month showed EF of 60 to 65% with grade 2 diastolic dysfunction with mild pulmonary hypertension.  Given the exertional symptoms and history of CAD will consult cardiology. CAD status post CABG denies any chest pain.  Will continue with aspirin  Zetia  Imdur  metoprolol . COPD/asthma on Breztri as needed albuterol . Renal transplant on prednisone  and tacrolimus .   Hypertension on Imdur  amlodipine  and metoprolol . GERD on PPI. Diet controlled diabetes on sliding scale coverage.  Last hemoglobin A1c was 6.15 months ago.  Since patient has exertional dyspnea with history of CAD will need close monitoring further workup and more than 2 midnight stay.   DVT prophylaxis: Lovenox . Code Status: Full code. Family Communication: Discussed with patient. Disposition Plan: Medical floor. Consults called: Cardiology. Admission status: Observation.

## 2024-04-17 NOTE — ED Notes (Signed)
 Report given to Ty, RN at [facility] 4E.

## 2024-04-17 NOTE — ED Provider Notes (Signed)
 [place] EMERGENCY DEPARTMENT AT [facility] Provider Note   CSN: [id number] Arrival date & time: 04/17/24  1629     History {Add pertinent medical, surgical, social history, OB history to HPI:1} Chief Complaint  Patient presents with   Shortness of Breath    [name] is a 83 y.o. female.  The history is provided by the patient and medical records. No language interpreter was used.  Shortness of Breath Severity:  Severe Onset quality:  Gradual Duration:  1 day Timing:  Intermittent Progression:  Partially resolved Chronicity:  Recurrent Context: URI   Relieved by:  Nothing Worsened by:  Exertion Associated symptoms: chest pain, cough, sputum production and wheezing   Associated symptoms: no abdominal pain, no fever, no headaches, no neck pain, no rash, no syncope and no vomiting        Home Medications Prior to Admission medications   Medication Sig Start Date End Date Taking? Authorizing Provider  acetaminophen  (TYLENOL ) 650 MG CR tablet Take 650 mg by mouth every morning.    [provider]  albuterol  (PROVENTIL ) (2.5 MG/3ML) 0.083% nebulizer solution Take 2.5 mg by nebulization every 6 (six) hours as needed for wheezing or shortness of breath.    [provider]  albuterol  (VENTOLIN  HFA) 108 (90 Base) MCG/ACT inhaler Inhale 1-2 puffs into the lungs every 6 (six) hours as needed for wheezing or shortness of breath.    [provider]  amLODipine  (NORVASC ) 10 MG tablet Take 10 mg by mouth daily.     [provider]  aspirin  EC 81 MG tablet Take 81 mg by mouth daily.    [provider]  Azelastine  HCl 0.15 % SOLN Place 1 spray into both nostrils daily as needed for allergies.    [provider]  budesonide  (PULMICORT ) 0.25 MG/2ML nebulizer solution Inhale 0.25 mg into the lungs as needed. 02/11/20   [provider]  calcitRIOL  (ROCALTROL ) 0.25 MCG capsule Take 0.25 mcg by mouth daily.      [provider]  ezetimibe  (ZETIA ) 10 MG tablet TAKE 1 TABLET BY MOUTH EVERY DAY 01/16/24   Nahser, Lela Purple, MD  fluticasone  (FLONASE ) 50 MCG/ACT nasal spray Place into both nostrils. 05/30/21   [provider]  Fluticasone -Umeclidin-Vilant (TRELEGY ELLIPTA) 200-62.5-25 MCG/ACT AEPB Inhale 1 puff into the lungs daily.    [provider]  isosorbide  mononitrate (IMDUR ) 30 MG 24 hr tablet Take 1 tablet (30 mg total) by mouth daily. 04/15/24   Nahser, Lela Purple, MD  KLOR-CON  M20 20 MEQ tablet TAKE 1 TABLET BY MOUTH EVERY DAY 11/22/23   Nahser, Lela Purple, MD  metoprolol  tartrate (LOPRESSOR ) 50 MG tablet TAKE 1 TABLET BY MOUTH TWICE A DAY 07/24/23   Nahser, Lela Purple, MD  nitroGLYCERIN  (NITROSTAT ) 0.4 MG SL tablet Place 1 tablet (0.4 mg total) under the tongue every 5 (five) minutes as needed for chest pain. 10/03/22   Nahser, Lela Purple, MD  omeprazole (PRILOSEC) 20 MG capsule Take 20 mg by mouth daily after breakfast.    [provider]  ONETOUCH VERIO test strip 1 EACH BY OTHER ROUTE DAILY IN THE AFTERNOON. USE AS INSTRUCTED 07/02/23   Shamleffer, Ibtehal Jaralla, MD  predniSONE  (DELTASONE ) 20 MG tablet 2 tabs po daily x 4 days 04/10/24   Jerilynn Montenegro, MD  tacrolimus  (PROGRAF ) 1 MG capsule Take 4 mg by mouth See admin instructions. Take 4 mg by mouth at 9 AM and 4 mg at 9 PM    [provider]  Tiotropium Bromide  Monohydrate 1.25 MCG/ACT AERS Inhale 1 puff into the lungs daily. 10/23/17   [provider]      Allergies    Sodium hypochlorite, Lactose, Asa arthritis strength-antacid [aspirin  buffered], Aspirin , Atorvastatin, Banana, Lactose intolerance (gi), and Lisinopril    Review of Systems   Review of Systems  Constitutional:  Positive for fatigue. Negative for chills and fever.  HENT:  Negative for congestion.   Respiratory:  Positive for cough, sputum production, chest tightness, shortness of breath and wheezing. Negative for stridor.    Cardiovascular:  Positive for chest pain. Negative for palpitations, leg swelling and syncope.  Gastrointestinal:  Negative for abdominal pain, constipation, diarrhea, nausea and vomiting.  Genitourinary:  Negative for dysuria.  Musculoskeletal:  Negative for back pain, neck pain and neck stiffness.  Skin:  Negative for rash and wound.  Neurological:  Negative for weakness, light-headedness, numbness and headaches.  Psychiatric/Behavioral:  Negative for agitation.   All other systems reviewed and are negative.   Physical Exam Updated Vital Signs LMP 03/04/2022  Physical Exam Vitals and nursing note reviewed.  Constitutional:      General: She is not in acute distress.    Appearance: She is well-developed. She is not ill-appearing, toxic-appearing or diaphoretic.  HENT:     Head: Normocephalic and atraumatic.  Eyes:     Conjunctiva/sclera: Conjunctivae normal.     Pupils: Pupils are equal, round, and reactive to light.  Cardiovascular:     Rate and Rhythm: Normal rate and regular rhythm.     Heart sounds: No murmur heard. Pulmonary:     Effort: Pulmonary effort is normal. No respiratory distress.     Breath sounds: Examination of the right-lower field reveals rales. Examination of the left-lower field reveals rales. Rales present. No wheezing or rhonchi.  Chest:     Chest wall: No tenderness.  Abdominal:     Palpations: Abdomen is soft.     Tenderness: There is no abdominal tenderness.  Musculoskeletal:        General: No swelling.     Cervical back: Neck supple.     Right lower leg: No tenderness. No edema.     Left lower leg: No tenderness. No edema.  Skin:    General: Skin is warm and dry.     Capillary Refill: Capillary refill takes less than 2 seconds.     Findings: No erythema.  Neurological:     General: No focal deficit present.     Mental Status: She is alert.  Psychiatric:        Mood and Affect: Mood normal.     ED Results / Procedures / Treatments    Labs (all labs ordered are listed, but only abnormal results are displayed) Labs Reviewed  I-STAT VENOUS BLOOD GAS, ED - Abnormal; Notable for the following components:      Result Value   pCO2, Ven 39.9 (*)    Acid-base deficit 3.0 (*)    All other components within normal limits  RESP PANEL BY RT-PCR (RSV, FLU A&B, COVID)  RVPGX2  BASIC METABOLIC PANEL WITH GFR  CBC  PRO BRAIN NATRIURETIC PEPTIDE  TROPONIN T, HIGH SENSITIVITY    EKG EKG Interpretation Date/Time:  Thursday Apr 17 2024 16:38:26 EDT Ventricular Rate:  65 PR Interval:  164 QRS Duration:  94 QT Interval:  441 QTC Calculation: 459 R Axis:   28  Text Interpretation: Sinus rhythm Probable left atrial enlargement Abnormal R-wave progression, early transition ST  elevation, consider lateral injury when compared to prior, similar appearnce No STEMI Confirmed by Wynell Heath ([id number]) on 04/17/2024 4:46:01 PM  Radiology No results found.  Procedures Procedures  {Document cardiac monitor, telemetry assessment procedure when appropriate:1}  Medications Ordered in ED Medications  ipratropium-albuterol  (DUONEB) 0.5-2.5 (3) MG/3ML nebulizer solution (has no administration in time range)  ipratropium-albuterol  (DUONEB) 0.5-2.5 (3) MG/3ML nebulizer solution 3 mL (3 mLs Nebulization Given 04/17/24 1642)    ED Course/ Medical Decision Making/ A&P   {   Click here for ABCD2, HEART and other calculatorsREFRESH Note before signing :1}                              Medical Decision Making Amount and/or Complexity of Data Reviewed Labs: ordered. Radiology: ordered.  Risk Prescription drug management.    KRISTL MORIOKA is a 83 y.o. 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  According to patient, she had worsening shortness of breath  again today and oxygen  saturations were found to be in the 60s on her home 6 L.  She said that she has had 3 episodes where she had to come to the hospital over the last month with episodes of hypoxia and cough.  She reports cough with a white sputum but denies fevers or chills.  She does report some chest tightness and pressure that is exertional.  She denies pleuritic pain at this time.  She denies any leg pain or leg swelling.  She denies any nausea, vomiting, constipation, diarrhea, or urinary changes.  She reportedly had wheezing earlier  On arrival, patient was given a breathing treatment with respiratory therapy.  By the time I listen to the patient, her lungs were relatively clear.  Minimal crackles in the bases but no wheezing for me.  Chest was nontender.  Abdomen nontender.  Good pulses in extremities.  Legs nontender nonedematous.  Patient otherwise has reassuring vital signs on my eval with blood pressure in the 150s, she is not tachycardic or tachypneic and oxygen  saturations are in the 90s on 7 L.  She is afebrile.  EKG shows no STEMI  Given the patient's hypoxia today with respiratory distress that has had multiple visits to the emergency department over the last few weeks I anticipate patient may require admission if she does not improve with her breathing or we can find concerning things.  Will get chest x-ray and labs.  Given her transplant kidney, will not be able to do contrast imaging given her lack of pleuritic discomfort have low suspicion for pulmonary embolism at this time.  Will get chest x-ray and labs and reassess.  Anticipate reassessment after workup.     8:30 PM Patient is back on home oxygen  but she is resting and still has mild shortness of breath.  Her troponin seems stable around 20 but her proBNP is elevated over 4000.  She is negative for COVID/flu/RSV and her creatinine is 1.52.  This is similar range what has been in the past.  Patient does have her transplant  kidney.  Chest x-ray did not show large pneumonia or significant fluid however clinically I did hear some crackles on exam and with her oxygen  saturations going into the 60s today I am concerned about the safety of her going home.  She says she does not take a diuretic in the setting of the transplant kidney but I am concerned about fluid overload and the recurrent hypoxia.  Given the oxygen  saturation in the 60s and respiratory distress she was in earlier, I do feel she needs admission.  There is likely combination of some fluid overload as well as some reactive airway disease exacerbation.  Will call for admission for further management.   {Document critical care time when appropriate:1} {Document review of labs and clinical decision tools ie heart score, Chads2Vasc2 etc:1}  {Document your independent review of radiology images, and any outside records:1} {Document your discussion with family members, caretakers, and with consultants:1} {Document social determinants of health affecting pt's care:1} {Document your decision making why or why not admission, treatments were needed:1} Final Clinical Impression(s) / ED Diagnoses Final diagnoses:  None    Rx / DC Orders ED Discharge Orders     None

## 2024-04-17 NOTE — Progress Notes (Signed)
 Plan of Care Note for accepted transfer   Patient: [name] MRN: [id number]   DOA: 04/17/2024  Facility requesting transfer: [facility]   Requesting Provider: Dr. Manus Sellers   Reason for transfer: Acute on chronic hypoxic respiratory failure   Facility course: 83 yr old female with COPD, chronic hypoxic respiratory failure, HFpEF, HTN, T2DM, renal transplant, and CKD 3b who presents with increased SOB, cough, and O2 saturation of 69% on her usual 6 Lpm at outpatient clinic.   Pro-BNP is 4136. No acute findings on CXR.   She was wheezing initially, given a DuoNeb, and is now back on her usual 6 Lpm but given her severe hypoxia and comorbidity, ED is requesting observation in the hospital.   Plan of care: The patient is accepted for admission to Progressive unit, at [facility].   Author: Walton Guppy, MD 04/17/2024  Check [email or web address] for on-call coverage.  Nursing staff, Please call [facility] Admits & Consults System-Wide number on Amion as soon as patient's arrival, so appropriate admitting provider can evaluate the pt.

## 2024-04-18 ENCOUNTER — Encounter (HOSPITAL_COMMUNITY): Payer: Self-pay | Admitting: Family Medicine

## 2024-04-18 DIAGNOSIS — R0609 Other forms of dyspnea: Secondary | ICD-10-CM | POA: Diagnosis not present

## 2024-04-18 DIAGNOSIS — Z951 Presence of aortocoronary bypass graft: Secondary | ICD-10-CM

## 2024-04-18 LAB — CBC WITH DIFFERENTIAL/PLATELET
Abs Immature Granulocytes: 0.15 10*3/uL — ABNORMAL HIGH (ref 0.00–0.07)
Basophils Absolute: 0 10*3/uL (ref 0.0–0.1)
Basophils Relative: 0 %
Eosinophils Absolute: 0 10*3/uL (ref 0.0–0.5)
Eosinophils Relative: 0 %
HCT: 34.3 % — ABNORMAL LOW (ref 36.0–46.0)
Hemoglobin: 11.4 g/dL — ABNORMAL LOW (ref 12.0–15.0)
Immature Granulocytes: 2 %
Lymphocytes Relative: 14 %
Lymphs Abs: 0.9 10*3/uL (ref 0.7–4.0)
MCH: 24.6 pg — ABNORMAL LOW (ref 26.0–34.0)
MCHC: 33.2 g/dL (ref 30.0–36.0)
MCV: 74.1 fL — ABNORMAL LOW (ref 80.0–100.0)
Monocytes Absolute: 0.8 10*3/uL (ref 0.1–1.0)
Monocytes Relative: 11 %
Neutro Abs: 5 10*3/uL (ref 1.7–7.7)
Neutrophils Relative %: 73 %
Platelets: 137 10*3/uL — ABNORMAL LOW (ref 150–400)
RBC: 4.63 MIL/uL (ref 3.87–5.11)
RDW: 18.6 % — ABNORMAL HIGH (ref 11.5–15.5)
WBC: 6.8 10*3/uL (ref 4.0–10.5)
nRBC: 0 % (ref 0.0–0.2)

## 2024-04-18 LAB — BASIC METABOLIC PANEL WITH GFR
Anion gap: 8 (ref 5–15)
BUN: 30 mg/dL — ABNORMAL HIGH (ref 8–23)
CO2: 24 mmol/L (ref 22–32)
Calcium: 9.2 mg/dL (ref 8.9–10.3)
Chloride: 106 mmol/L (ref 98–111)
Creatinine, Ser: 1.38 mg/dL — ABNORMAL HIGH (ref 0.44–1.00)
GFR, Estimated: 38 mL/min — ABNORMAL LOW (ref >60)
Glucose, Bld: 163 mg/dL — ABNORMAL HIGH (ref 70–99)
Potassium: 3.9 mmol/L (ref 3.5–5.1)
Sodium: 138 mmol/L (ref 135–145)

## 2024-04-18 LAB — HEPATIC FUNCTION PANEL
ALT: 19 U/L (ref 0–44)
AST: 19 U/L (ref 15–41)
Albumin: 3 g/dL — ABNORMAL LOW (ref 3.5–5.0)
Alkaline Phosphatase: 51 U/L (ref 38–126)
Bilirubin, Direct: <0.1 mg/dL (ref 0.0–0.2)
Total Bilirubin: 0.6 mg/dL (ref 0.0–1.2)
Total Protein: 5.4 g/dL — ABNORMAL LOW (ref 6.5–8.1)

## 2024-04-18 LAB — TSH: TSH: 0.894 u[IU]/mL (ref 0.350–4.500)

## 2024-04-18 LAB — GLUCOSE, CAPILLARY
Glucose-Capillary: 91 mg/dL (ref 70–99)
Glucose-Capillary: 183 mg/dL — ABNORMAL HIGH (ref 70–99)
Glucose-Capillary: 209 mg/dL — ABNORMAL HIGH (ref 70–99)
Glucose-Capillary: 150 mg/dL — ABNORMAL HIGH (ref 70–99)

## 2024-04-18 LAB — BRAIN NATRIURETIC PEPTIDE: B Natriuretic Peptide: 1062.9 pg/mL — ABNORMAL HIGH (ref 0.0–100.0)

## 2024-04-18 LAB — TROPONIN I (HIGH SENSITIVITY)
Troponin I (High Sensitivity): 16 ng/L (ref <18)
Troponin I (High Sensitivity): 15 ng/L (ref <18)

## 2024-04-18 MED ORDER — CALCITRIOL 0.25 MCG PO CAPS
0.2500 ug | ORAL_CAPSULE | Freq: Every day | ORAL | Status: DC
  Administered 2024-04-18 – 2024-04-22 (×5): 0.25 ug via ORAL
  Filled 2024-04-18 (×5): qty 1

## 2024-04-18 MED ORDER — TACROLIMUS 1 MG PO CAPS
4.0000 mg | ORAL_CAPSULE | Freq: Two times a day (BID) | ORAL | Status: DC
  Administered 2024-04-18 – 2024-04-22 (×10): 4 mg via ORAL
  Filled 2024-04-18 (×11): qty 4

## 2024-04-18 MED ORDER — EZETIMIBE 10 MG PO TABS
10.0000 mg | ORAL_TABLET | Freq: Every day | ORAL | Status: DC
  Administered 2024-04-18 – 2024-04-22 (×5): 10 mg via ORAL
  Filled 2024-04-18 (×5): qty 1

## 2024-04-18 MED ORDER — INSULIN ASPART 100 UNIT/ML IJ SOLN
0.0000 [IU] | Freq: Three times a day (TID) | INTRAMUSCULAR | Status: DC
  Administered 2024-04-18: 2 [IU] via SUBCUTANEOUS
  Administered 2024-04-18 – 2024-04-19 (×2): 3 [IU] via SUBCUTANEOUS
  Administered 2024-04-19: 1 [IU] via SUBCUTANEOUS
  Administered 2024-04-20: 5 [IU] via SUBCUTANEOUS
  Administered 2024-04-21: 3 [IU] via SUBCUTANEOUS
  Administered 2024-04-21: 1 [IU] via SUBCUTANEOUS
  Administered 2024-04-22: 3 [IU] via SUBCUTANEOUS

## 2024-04-18 MED ORDER — HEPARIN SODIUM (PORCINE) 5000 UNIT/ML IJ SOLN
5000.0000 [IU] | Freq: Three times a day (TID) | INTRAMUSCULAR | Status: DC
  Administered 2024-04-18 – 2024-04-22 (×14): 5000 [IU] via SUBCUTANEOUS
  Filled 2024-04-18 (×14): qty 1

## 2024-04-18 MED ORDER — METOPROLOL TARTRATE 50 MG PO TABS
50.0000 mg | ORAL_TABLET | Freq: Two times a day (BID) | ORAL | Status: DC
  Administered 2024-04-18 – 2024-04-22 (×9): 50 mg via ORAL
  Filled 2024-04-18 (×9): qty 1

## 2024-04-18 MED ORDER — ASPIRIN 81 MG PO TBEC
81.0000 mg | DELAYED_RELEASE_TABLET | Freq: Every day | ORAL | Status: DC
  Administered 2024-04-18 – 2024-04-22 (×5): 81 mg via ORAL
  Filled 2024-04-18 (×5): qty 1

## 2024-04-18 MED ORDER — POTASSIUM CHLORIDE 20 MEQ PO PACK
20.0000 meq | PACK | Freq: Once | ORAL | Status: AC
  Administered 2024-04-18: 20 meq via ORAL
  Filled 2024-04-18: qty 1

## 2024-04-18 MED ORDER — FUROSEMIDE 10 MG/ML IJ SOLN
20.0000 mg | Freq: Once | INTRAMUSCULAR | Status: AC
  Administered 2024-04-18: 20 mg via INTRAVENOUS
  Filled 2024-04-18: qty 2

## 2024-04-18 MED ORDER — ISOSORBIDE MONONITRATE ER 30 MG PO TB24
30.0000 mg | ORAL_TABLET | Freq: Every day | ORAL | Status: DC
  Administered 2024-04-18 – 2024-04-22 (×5): 30 mg via ORAL
  Filled 2024-04-18 (×5): qty 1

## 2024-04-18 MED ORDER — AMLODIPINE BESYLATE 5 MG PO TABS
10.0000 mg | ORAL_TABLET | Freq: Every day | ORAL | Status: DC
  Administered 2024-04-18 – 2024-04-22 (×5): 10 mg via ORAL
  Filled 2024-04-18 (×5): qty 2

## 2024-04-18 MED ORDER — POTASSIUM CHLORIDE CRYS ER 20 MEQ PO TBCR
20.0000 meq | EXTENDED_RELEASE_TABLET | Freq: Every day | ORAL | Status: DC
  Administered 2024-04-18 – 2024-04-22 (×5): 20 meq via ORAL
  Filled 2024-04-18 (×5): qty 1

## 2024-04-18 MED ORDER — PREDNISONE 20 MG PO TABS
20.0000 mg | ORAL_TABLET | Freq: Every day | ORAL | Status: DC
  Administered 2024-04-18 – 2024-04-22 (×5): 20 mg via ORAL
  Filled 2024-04-18 (×5): qty 1

## 2024-04-18 MED ORDER — PANTOPRAZOLE SODIUM 40 MG PO TBEC
40.0000 mg | DELAYED_RELEASE_TABLET | Freq: Every day | ORAL | Status: DC
  Administered 2024-04-18 – 2024-04-22 (×5): 40 mg via ORAL
  Filled 2024-04-18 (×5): qty 1

## 2024-04-18 MED ORDER — ALBUTEROL SULFATE (2.5 MG/3ML) 0.083% IN NEBU: 2.5000 mg | INHALATION_SOLUTION | Freq: Four times a day (QID) | RESPIRATORY_TRACT | Status: DC | PRN

## 2024-04-18 MED ORDER — BUDESON-GLYCOPYRROL-FORMOTEROL 160-9-4.8 MCG/ACT IN AERO
2.0000 | INHALATION_SPRAY | Freq: Two times a day (BID) | RESPIRATORY_TRACT | Status: DC
  Administered 2024-04-18 – 2024-04-22 (×9): 2 via RESPIRATORY_TRACT
  Filled 2024-04-18: qty 5.9

## 2024-04-18 NOTE — TOC Initial Note (Signed)
 Transition of Care [facility]) - Initial/Assessment Note    Patient Details  Name: [name] MRN: [id number] Date of Birth: September 10, 1941  Transition of Care [facility] CM/SW Contact:    Ruben Corolla, RN Phone Number: 04/18/2024, 1:11 PM  Clinical Narrative:d/c plan home. Active [employer]-home 02-has travel tank;Active [facility] rep Amy aware.  PT eval await recc.                 Expected Discharge Plan: Home w Home Health Services Barriers to Discharge: Continued Medical Work up   Patient Goals and CMS Choice Patient states their goals for this hospitalization and ongoing recovery are:: Home CMS Medicare.gov Compare Post Acute Care list provided to:: Patient Choice offered to / list presented to : Patient [place] ownership interest in [facility].provided to:: Patient    Expected Discharge Plan and Services   Discharge Planning Services: CM Consult Post Acute Care Choice: Home Health Living arrangements for the past 2 months: Single Family Home                                      Prior Living Arrangements/Services Living arrangements for the past 2 months: Single Family Home Lives with:: Friends   Do you feel safe going back to the place where you live?: Yes          Current home services: DME (Active [facility] home 02-has travel tank;Active [facility].)    Activities of Daily Living   ADL Screening (condition at time of admission) Independently performs ADLs?: Yes (appropriate for developmental age) Is the patient deaf or have difficulty hearing?: No Does the patient have difficulty seeing, even when wearing glasses/contacts?: No Does the patient have difficulty concentrating, remembering, or making decisions?: No  Permission Sought/Granted Permission sought to share information with : Case Manager Permission granted to share information with : Yes, Verbal Permission Granted              Emotional Assessment               Admission diagnosis:  Hypoxia [R09.02] Acute on chronic respiratory failure with hypoxia (HCC) [J96.21] Acute respiratory failure with hypoxia (HCC) [J96.01] Patient Active Problem List   Diagnosis Date Noted   Exertional dyspnea 04/18/2024   Acute on chronic respiratory failure with hypoxia (HCC) 04/17/2024   Acute respiratory failure with hypoxia (HCC) 03/22/2024   CKD (chronic kidney disease) stage 3, GFR 30-59 ml/min (HCC) 03/22/2024   Diabetes mellitus type 2 in nonobese (HCC) 03/22/2024   Transplanted kidney 08/08/2023   AKI (acute kidney injury) (HCC) 08/07/2023   Kidney transplant recipient 08/07/2023   COPD exacerbation (HCC) 08/06/2023   Acute on chronic hypoxic respiratory failure (HCC) 08/05/2023   History of renal transplantation 07/10/2022   Asymptomatic menopausal state 11/03/2021   Olecranon bursitis of right elbow 12/26/2019   Right arm cellulitis 12/18/2019   Fever    Hx of colonic polyps 09/30/2019   Family history of breast cancer    Family history of prostate cancer    Family history of lung cancer 08/08/2018   Shoulder pain, right 04/04/2018   Severe persistent asthma with (acute) exacerbation 02/13/2018   GERD (gastroesophageal reflux disease) 02/12/2018   Severe persistent asthma with acute exacerbation 02/12/2018   Complete rotator cuff tear of 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   Subacromial impingement of 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   Primary osteoarthritis, 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  History of methicillin resistant staphylococcus aureus (MRSA)    Benign neoplasm of colon 07/14/2014   Nausea & vomiting 12/22/2013   Dyspnea 12/22/2013   Diabetes (HCC) 02/28/2013   Preop cardiovascular exam    Type II or unspecified type diabetes mellitus without mention of complication, not stated as uncontrolled 06/05/2012   Encounter for long-term (current) use of other medications 02/26/2012   Type II or unspecified type diabetes mellitus with renal manifestations, not  stated as uncontrolled(250.40) 02/26/2012   S/P kidney transplant    H/O steroid therapy    Anemia    Coronary artery disease    Hyperlipidemia    Hypertension    Aspirin  allergy    Hx of CABG    Ejection fraction    Anemia    GI bleed 11/26/2010   Pneumonia 08/20/2010   FOOT PAIN 01/20/2010   THROMBOCYTOPENIA 01/11/2009   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 [place] [address] Phone: [phone] Fax: [phone]     Social Drivers of Health (SDOH) Social History: SDOH Screenings   Food Insecurity: No Food Insecurity (04/17/2024)  Housing: Low Risk  (04/17/2024)  Transportation Needs: No Transportation Needs (04/17/2024)  Utilities: Not At Risk (04/17/2024)  Depression (PHQ2-9): Low Risk  (11/17/2022)  Financial Resource Strain: Low Risk  (04/02/2024)   Received from [facility]  Physical Activity: Sufficiently Active (08/09/2022)   [facility], [facility]  Social Connections: Moderately Integrated (04/17/2024)  Stress: No Stress Concern Present (08/09/2022)   [facility]  Tobacco Use: Medium Risk (04/18/2024)   SDOH Interventions:     Readmission Risk Interventions     No data to display

## 2024-04-18 NOTE — Consult Note (Signed)
 CARDIOLOGY CONSULT NOTE       Patient ID: [name] MRN: [id number] DOB/AGE: Jun 15, 1941 83 y.o.  Admit date: 04/17/2024 Referring Physician: [facility] Primary Physician: Beam, Conway Dennis, MD Primary Cardiologist: Nahser Reason for Consultation: Dyspnea  Principal Problem:   Exertional dyspnea Active Problems:   GOUT   THROMBOCYTOPENIA   Persistent asthma without complication   Anemia   Coronary artery disease   Hyperlipidemia   Hypertension   Hx of CABG   S/P kidney transplant   Acute respiratory failure with hypoxia (HCC)   Acute on chronic respiratory failure with hypoxia (HCC)   HPI:  83 y.o. asked to see for dyspnea. 2nd hospitalization this month for same. She is former smoker with known COPD. She sees pulmonary in [place]. She has been on oxygen  for 5 years. Currently ? 6 L's. She has had distant CABG in 2001. Myovue done 09/2022 with no ischemia old IMI. Echo done 03/24/24 showed EF 60-65% with grade 2 diastolic dysfunction AV sclerosis with mil/mod AR and trivial MR She denies chest pain. She has not had edema. Seems like her sats usually drop with exertion. She lives with her grandson still drives and does all ADL's. 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, hold ASA 2-3 weeks - consider plavix instead of ASA   Asthma    Cellulitis 12/19/2019   right upper arm   Coronary artery disease    cath 11/2007, grafts patent /  Nuclear, June, 2011, prior inferior MI with mild peri-infarct ischemia, anterior breast attenuation, EF 67%, done for renal transplant assessment / Low level exercise/Lexiscan  Myoview  (11/2013): EF 67%, no ischemi; normal study    Diabetes mellitus    type 2   ESRD on dialysis [facility])    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, 2012   Family history of breast cancer    Family history of prostate cancer    GERD (gastroesophageal reflux disease)    GI bleed 11/26/2010   January, 2012 , AVM   Gout    History of methicillin resistant staphylococcus aureus (MRSA)    Hyperlipidemia    Hyperparathyroidism    Hypertension    Myocardial infarction [facility])    Osteoporosis    Pneumonia 08/2010    Hospitalization, October, 2011   Primary osteoarthritis, 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   S/P kidney transplant    September, 2012, [facility]   Subacromial impingement of 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    Family History  Problem Relation Age of Onset   Prostate cancer Brother    Lung cancer Brother    Cancer Neg Hx     Social History   Socioeconomic History   Marital status: Divorced    Spouse name: Not on file   Number of children: Not on file   Years of education: Not on file   Highest education level: Not on file  Occupational History   Not on file  Tobacco Use   Smoking status: Former    Current packs/day: 0.00    Average packs/day: 1 pack/day for 8.0 years (8.0 ttl pk-yrs)    Types: Cigarettes    Start date: 11/21/1975  Quit date: 11/21/1983    Years since quitting: 40.4   Smokeless tobacco: Never  Vaping Use   Vaping status: Never Used  Substance and Sexual Activity   Alcohol use: No   Drug use: No   Sexual activity: Not on file  Other Topics Concern   Not on file  Social History Narrative   Not on file   Social Drivers of Health   Financial Resource Strain: Low Risk  (04/02/2024)   Received from [facility]   Overall Financial Resource Strain (CARDIA)    Difficulty of Paying Living Expenses: Not hard at all  Food Insecurity: No Food Insecurity (04/17/2024)   Hunger Vital Sign    Worried About Running Out of Food in the Last Year: Never true    Ran Out of Food in the Last Year: Never true  Transportation Needs: No  Transportation Needs (04/17/2024)   PRAPARE - Administrator, Civil Service (Medical): No    Lack of Transportation (Non-Medical): No  Physical Activity: Sufficiently Active (08/09/2022)   Received from [facility]   Exercise Vital Sign    Days of Exercise per Week: 2 days    Minutes of Exercise per Session: 120 min  Stress: No Stress Concern Present (08/09/2022)   Received from [facility] Occup[facility] - Occupational Stress Questionnaire    Feeling of Stress : Only a little  Social Connections: Moderately Integrated (04/17/2024)   Social Connection and Isolation Panel [NHANES]    Frequency of Communication with Friends and Family: Three times a week    Frequency of Social Gatherings with Friends and Family: Once a week    Attends Religious Services: More than 4 times per year    Active Member of [employer] or Organizations: Yes    Attends Banker Meetings: 1 to 4 times per year    Marital Status: Widowed  Intimate Partner Violence: Not At Risk (04/17/2024)   Humiliation, Afraid, Rape, and Kick questionnaire    Fear of Current or Ex-Partner: No    Emotionally Abused: No    Physically Abused: No    Sexually Abused: No    Past Surgical History:  Procedure Laterality Date   ABDOMINAL HYSTERECTOMY  1980'S   ARTERIOVENOUS GRAFT PLACEMENT Left 03/04/2007   forearm   BIOPSY  09/30/2019   Procedure: BIOPSY;  Surgeon: Baldo Bonds, MD;  Location: [facility];  Service: Endoscopy;;   BIOPSY  12/27/2021   Procedure: BIOPSY;  Surgeon: Baldo Bonds, MD;  Location: [facility];  Service: Endoscopy;;   BREAST EXCISIONAL BIOPSY Right    benign more than 10 yr ago   BREAST SURGERY  YRS AGO   RT BREAST CYST REMOVED    CARDIAC CATHETERIZATION  06/03/2002; 12/04/2007   COLONOSCOPY WITH PROPOFOL  N/A 07/14/2014   Procedure: COLONOSCOPY WITH PROPOFOL ;  Surgeon: Yvetta Herbert, MD;  Location: [facility];   Service: Endoscopy;  Laterality: N/A;   COLONOSCOPY WITH PROPOFOL  N/A 09/30/2019   Procedure: COLONOSCOPY WITH PROPOFOL ;  Surgeon: Baldo Bonds, MD;  Location: [facility];  Service: Endoscopy;  Laterality: N/A;   COLONOSCOPY WITH PROPOFOL  N/A 12/27/2021   Procedure: COLONOSCOPY WITH PROPOFOL ;  Surgeon: Baldo Bonds, MD;  Location: [facility];  Service: Endoscopy;  Laterality: N/A;   CORONARY ARTERY BYPASS GRAFT  06/06/2002   x 4   ESOPHAGOGASTRODUODENOSCOPY N/A 12/27/2021   Procedure: ESOPHAGOGASTRODUODENOSCOPY (EGD);  Surgeon: Baldo Bonds, MD;  Location: [facility];  Service: Endoscopy;  Laterality: N/A;   HEMOSTASIS CLIP PLACEMENT  12/27/2021   Procedure: HEMOSTASIS CLIP PLACEMENT;  Surgeon: Baldo Bonds, MD;  Location: [facility];  Service: Endoscopy;;   HOT HEMOSTASIS N/A 07/14/2014   Procedure: HOT HEMOSTASIS (ARGON PLASMA COAGULATION/BICAP);  Surgeon: Yvetta Herbert, MD;  Location: [facility];  Service: Endoscopy;  Laterality: N/A;   IR FLUORO GUIDE CV LINE RIGHT  12/22/2019   IR US  GUIDE VASC ACCESS RIGHT  12/22/2019   KIDNEY TRANSPLANT Right 08/04/2011   ORIF PATELLA Left 04/06/2016   Procedure: OPEN REDUCTION INTERNAL (ORIF) LEFT  PATELLA;  Surgeon: Ferd Householder, MD;  Location: [facility];  Service: Orthopedics;  Laterality: Left;   POLYPECTOMY  09/30/2019   Procedure: POLYPECTOMY;  Surgeon: Baldo Bonds, MD;  Location: [facility];  Service: Endoscopy;;   POLYPECTOMY  12/27/2021   Procedure: POLYPECTOMY;  Surgeon: Baldo Bonds, MD;  Location: [facility];  Service: Endoscopy;;   SHOULDER ARTHROSCOPY WITH ROTATOR CUFF REPAIR AND SUBACROMIAL DECOMPRESSION Left 08/03/2016   Procedure: LEFT SHOULDER ARTHROSCOPY WITH ROTATOR CUFF REPAIR AND SUBACROMIAL DECOMPRESSION with distal claviculectomy and extentsive debridement;  Surgeon: Ferd Householder, MD;  Location: [facility];  Service: Orthopedics;  Laterality: Left;  Block    THROMBECTOMY AND REVISION OF ARTERIOVENTOUS (AV) GORETEX  GRAFT Left 05/08/2007   forearm   TOTAL HIP ARTHROPLASTY  12/18/2012   Procedure: TOTAL HIP ARTHROPLASTY;  Surgeon: Ferd Householder, MD;  Location: [facility];  Service: Orthopedics;  Laterality: Left;   VESICOVAGINAL FISTULA CLOSURE W/ TAH  1984      Current Facility-Administered Medications:    albuterol  (PROVENTIL ) (2.5 MG/3ML) 0.083% nebulizer solution 2.5 mg, 2.5 mg, Nebulization, Q6H PRN, Angelene Kelly, MD   amLODipine  (NORVASC ) tablet 10 mg, 10 mg, Oral, Daily, Angelene Kelly, MD   aspirin  EC tablet 81 mg, 81 mg, Oral, Daily, Kakrakandy, Arshad N, MD   budesonide -glycopyrrolate -formoterol (BREZTRI) 160-9-4.8 MCG/ACT inhaler 2 puff, 2 puff, Inhalation, BID, Angelene Kelly, MD, 2 puff at 04/18/24 9528   calcitRIOL  (ROCALTROL ) capsule 0.25 mcg, 0.25 mcg, Oral, Daily, Kakrakandy, Arshad N, MD   ezetimibe  (ZETIA ) tablet 10 mg, 10 mg, Oral, Daily, Angelene Kelly, MD   heparin  injection 5,000 Units, 5,000 Units, Subcutaneous, Q8H, Kakrakandy, Arshad N, MD, 5,000 Units at 04/18/24 4132   insulin  aspart (novoLOG ) injection 0-9 Units, 0-9 Units, Subcutaneous, TID WC, Kakrakandy, Arshad N, MD   isosorbide  mononitrate (IMDUR ) 24 hr tablet 30 mg, 30 mg, Oral, Daily, Angelene Kelly, MD   metoprolol  tartrate (LOPRESSOR ) tablet 50 mg, 50 mg, Oral, BID, Angelene Kelly, MD   pantoprazole  (PROTONIX ) EC tablet 40 mg, 40 mg, Oral, Daily, Kakrakandy, Arshad N, MD   potassium chloride  SA (KLOR-CON  M) CR tablet 20 mEq, 20 mEq, Oral, Daily, Angelene Kelly, MD   predniSONE  (DELTASONE ) tablet 20 mg, 20 mg, Oral, Q breakfast, Angelene Kelly, MD, 20 mg at 04/18/24 4401   tacrolimus  (PROGRAF ) capsule 4 mg, 4 mg, Oral, BID, Kakrakandy, Arshad N, MD, 4 mg at 04/18/24 0272  amLODipine   10 mg Oral Daily   aspirin  EC  81 mg Oral Daily   budesonide -glycopyrrolate -formoterol  2 puff Inhalation BID   calcitRIOL   0.25 mcg  Oral Daily   ezetimibe   10 mg Oral Daily   heparin   5,000 Units Subcutaneous Q8H   insulin  aspart  0-9 Units Subcutaneous TID WC   isosorbide  mononitrate  30 mg Oral Daily   metoprolol  tartrate  50 mg Oral BID  pantoprazole   40 mg Oral Daily   potassium chloride  SA  20 mEq Oral Daily   predniSONE   20 mg Oral Q breakfast   tacrolimus   4 mg Oral BID     Physical Exam: Blood pressure (!) 160/56, pulse (!) 59, temperature 97.9 F (36.6 C), temperature source Oral, resp. rate 18, height 5\' 2"  (1.575 m), weight 54.9 kg, last menstrual period 03/04/2022, SpO2 97%.    Elderly black female Lungs clear Soft SEM LUE AV fistuala still with thrill Abdomen benign No edema   Labs:   Lab Results  Component Value Date   WBC 6.8 04/18/2024   HGB 11.4 (L) 04/18/2024   HCT 34.3 (L) 04/18/2024   MCV 74.1 (L) 04/18/2024   PLT 137 (L) 04/18/2024    Recent Labs  Lab 04/18/24 0244  NA 138  K 3.9  CL 106  CO2 24  BUN 30*  CREATININE 1.38*  CALCIUM  9.2  PROT 5.4*  BILITOT 0.6  ALKPHOS 51  ALT 19  AST 19  GLUCOSE 163*   Lab Results  Component Value Date   CKTOTAL 71 09/03/2010   CKMB 0.9 09/03/2010   TROPONINI 0.14 (HH) 02/13/2018    Lab Results  Component Value Date   CHOL 138 10/17/2021   CHOL 305 (H) 09/02/2021   CHOL 179 06/01/2021   Lab Results  Component Value Date   HDL 65 10/17/2021   HDL 69 09/02/2021   HDL 72 06/01/2021   Lab Results  Component Value Date   LDLCALC 57 10/17/2021   LDLCALC 213 (H) 09/02/2021   LDLCALC 88 06/01/2021   Lab Results  Component Value Date   TRIG 82 10/17/2021   TRIG 131 09/02/2021   TRIG 108 06/01/2021   Lab Results  Component Value Date   CHOLHDL 2.1 10/17/2021   CHOLHDL 4.4 09/02/2021   CHOLHDL 2.5 06/01/2021   Lab Results  Component Value Date   LDLDIRECT 136.3 02/26/2012      Radiology: DG Chest Portable 1 View Result Date: 04/17/2024 CLINICAL DATA:  Shortness of breath EXAM: PORTABLE CHEST 1 VIEW COMPARISON:   04/09/2024 FINDINGS: Prior CABG. Heart and mediastinal contours within normal limits. Aortic atherosclerosis. No confluent airspace opacities or effusions. No acute bony abnormality. IMPRESSION: No active disease. Electronically Signed   By: Janeece Mechanic M.D.   On: 04/17/2024 19:05   DG Chest Portable 1 View Result Date: 04/09/2024 CLINICAL DATA:  Respiratory distress. EXAM: PORTABLE CHEST 1 VIEW COMPARISON:  Mar 22, 2024. FINDINGS: Stable cardiomediastinal silhouette. Status post coronary artery bypass graft. Minimal bibasilar subsegmental atelectasis or scarring is noted. Bony thorax is unremarkable. IMPRESSION: Minimal bibasilar subsegmental atelectasis or scarring. Electronically Signed   By: Rosalene Colon M.D.   On: 04/09/2024 13:00   ECHOCARDIOGRAM COMPLETE Result Date: 03/24/2024    ECHOCARDIOGRAM REPORT   Patient Name:   WILSON SAMPLE Date of Exam: 03/24/2024 Medical Rec #:  [id number]        Height:       62.0 in Accession #:    [id number]       Weight:       121.0 lb Date of Birth:  [birth year]         BSA:          1.544 m Patient Age:    83 years         BP:           149/63 mmHg Patient Gender: F  HR:           81 bpm. Exam Location:  Inpatient Procedure: 2D Echo, Color Doppler and Cardiac Doppler (Both Spectral and Color            Flow Doppler were utilized during procedure). Indications:    Dyspnea R06.00  History:        Patient has prior history of Echocardiogram examinations, most                 recent 05/10/2023. CHF, CAD, Prior CABG, COPD; Risk                 Factors:Diabetes.  Sonographer:    Hersey Lorenzo RDCS Referring Phys: 2 ARSHAD N KAKRAKANDY IMPRESSIONS  1. Left ventricular ejection fraction, by estimation, is 60 to 65%. The left ventricle has normal function. The left ventricle has no regional wall motion abnormalities. There is mild concentric left ventricular hypertrophy. Left ventricular diastolic parameters are consistent with Grade II diastolic dysfunction  (pseudonormalization).  2. Right ventricular systolic function is low normal. The right ventricular size is mildly enlarged. There is mildly elevated pulmonary artery systolic pressure.  3. Left atrial size was moderately dilated.  4. Right atrial size was moderately dilated.  5. The mitral valve is normal in structure. Trivial mitral valve regurgitation. No evidence of mitral stenosis.  6. The aortic valve is tricuspid. There is moderate calcification of the aortic valve. There is mild thickening of the aortic valve. Aortic valve regurgitation is mild to moderate. Aortic valve sclerosis/calcification is present, without any evidence of  aortic stenosis.  7. Aortic dilatation noted. There is borderline dilatation of the ascending aorta, measuring 38 mm.  8. The inferior vena cava is normal in size with greater than 50% respiratory variability, suggesting right atrial pressure of 3 mmHg. Comparison(s): Prior images reviewed side by side. Similar to prior, though RV mildly enlarged compared to prior. Conclusion(s)/Recommendation(s): Otherwise normal echocardiogram, with minor abnormalities described in the report. FINDINGS  Left Ventricle: Left ventricular ejection fraction, by estimation, is 60 to 65%. The left ventricle has normal function. The left ventricle has no regional wall motion abnormalities. The left ventricular internal cavity size was normal in size. There is  mild concentric left ventricular hypertrophy. Left ventricular diastolic parameters are consistent with Grade II diastolic dysfunction (pseudonormalization). Right Ventricle: The right ventricular size is mildly enlarged. Right vetricular wall thickness was not well visualized. Right ventricular systolic function is low normal. There is mildly elevated pulmonary artery systolic pressure. The tricuspid regurgitant velocity is 2.92 m/s, and with an assumed right atrial pressure of 3 mmHg, the estimated right ventricular systolic pressure is 37.1  mmHg. Left Atrium: Left atrial size was moderately dilated. Right Atrium: Right atrial size was moderately dilated. Pericardium: Trivial pericardial effusion is present. Mitral Valve: The mitral valve is normal in structure. Trivial mitral valve regurgitation. No evidence of mitral valve stenosis. Tricuspid Valve: The tricuspid valve is grossly normal. Tricuspid valve regurgitation is trivial. No evidence of tricuspid stenosis. Aortic Valve: The aortic valve is tricuspid. There is moderate calcification of the aortic valve. There is mild thickening of the aortic valve. Aortic valve regurgitation is mild to moderate. Aortic regurgitation PHT measures 408 msec. Aortic valve sclerosis/calcification is present, without any evidence of aortic stenosis. Pulmonic Valve: The pulmonic valve was not well visualized. Pulmonic valve regurgitation is mild to moderate. No evidence of pulmonic stenosis. Aorta: Aortic dilatation noted. There is borderline dilatation of the ascending aorta, measuring 38 mm. Venous:  The inferior vena cava is normal in size with greater than 50% respiratory variability, suggesting right atrial pressure of 3 mmHg. IAS/Shunts: The atrial septum is grossly normal.  LEFT VENTRICLE PLAX 2D LVIDd:         3.50 cm   Diastology LVIDs:         2.30 cm   LV e' medial:    5.44 cm/s LV PW:         1.10 cm   LV E/e' medial:  10.8 LV IVS:        1.10 cm   LV e' lateral:   10.70 cm/s LVOT diam:     2.00 cm   LV E/e' lateral: 5.5 LV SV:         67 LV SV Index:   43 LVOT Area:     3.14 cm  RIGHT VENTRICLE            IVC RV S prime:     9.00 cm/s  IVC diam: 1.60 cm TAPSE (M-mode): 1.5 cm LEFT ATRIUM             Index        RIGHT ATRIUM           Index LA diam:        3.70 cm 2.40 cm/m   RA Area:     23.80 cm LA Vol (A2C):   39.8 ml 25.77 ml/m  RA Volume:   76.70 ml  49.67 ml/m LA Vol (A4C):   53.2 ml 34.45 ml/m LA Biplane Vol: 51.1 ml 33.09 ml/m  AORTIC VALVE LVOT Vmax:   126.00 cm/s LVOT Vmean:  76.500 cm/s  LVOT VTI:    0.212 m AI PHT:      408 msec  AORTA Ao Root diam: 3.40 cm Ao Asc diam:  3.80 cm MITRAL VALVE               TRICUSPID VALVE MV Area (PHT): 3.54 cm    TR Peak grad:   34.1 mmHg MV Decel Time: 214 msec    TR Vmax:        292.00 cm/s MV E velocity: 58.70 cm/s MV A velocity: 59.20 cm/s  SHUNTS MV E/A ratio:  0.99        Systemic VTI:  0.21 m                            Systemic Diam: 2.00 cm Sheryle Donning MD Electronically signed by Sheryle Donning MD Signature Date/Time: 03/24/2024/5:43:18 PM    Final    DG Chest Portable 1 View Result Date: 03/22/2024 CLINICAL DATA:  Dyspnea. EXAM: PORTABLE CHEST 1 VIEW COMPARISON:  08/05/2023 and 12/18/2019. FINDINGS: Borderline enlarged heart with an interval decrease in size. Tortuous and partially calcified thoracic aorta. Post CABG changes. Stable mild interstitial prominence with Kerley lines, not present on 12/18/2019. Minimal patchy density at the left lung base with improvement. No pleural fluid. Unremarkable bones. IMPRESSION: 1. Mild congestive heart failure. 2. Minimal left basilar atelectasis or alveolar edema with improvement. Electronically Signed   By: Catherin Closs M.D.   On: 03/22/2024 17:03    EKG: SR old IMA no acute changes   ASSESSMENT AND PLAN:   Dyspnea:  this appears to be primarily pulmonary. Despite high BNP she is not volume overloaded, exam and CXR clear. I reviewed her echo from 03/24/24 and disagree with interpretation of grade 2 diastolic dysfunction Both her medial/lateral  E/E' are only 10 and 5. Mild LVH and no morphologic worries for infiltrative DCM.  She is chronically on 6 L's of oxygen . Would have pulmonary see in hospital.  CABG:  distant with last myovue 10/06/22 no ischemia This and ECG show old IMI but EF normal on TTE. Shared decision making favor outpatient Lexiscan  myovue and not right/left cath to assess given her elevated Cr and renal transplant  Troponins negative CRF:  Cr1.38 avoid contrast Check  prograf  level Fistual in LUE not that big so doubt it is contributing to high output CHF  Signed: Janelle Mediate 04/18/2024, 9:21 AM

## 2024-04-18 NOTE — Progress Notes (Signed)
 PROGRESS NOTE  [name]  DOB: 19-Apr-1941  PCP: Claud Crumb, MD ZOX:[id number]  DOA: 04/17/2024  LOS: 0 days  Hospital Day: 2  Brief narrative: [name] is a 83 y.o. female with PMH significant for renal transplant status 2012, DM 2, HTN, HLD, CAD/CABG, asthma, GERD, gout, chronically on 6 L oxygen  5/29, patient presented to the ED with complaint of exertional shortness of breath Recently hospitalized 5/3 to 5/6 for similar symptoms attributed to CHF and COPD flareup.  Patient apparently has not felt improvement in her symptoms since then.  In the ED, afebrile, heart rate in 70s, blood pressure elevated to 170s, required 8 L oxygen  by nasal cannula. Labs with normal pH, normal pCO2 and VBG BUN/creatinine 33/1.52, proBNP elevated to over 4000, WBC normal, hemoglobin normal Chest x-ray unremarkable.   Admitted to [facility]  Subjective: Patient was seen and examined sitting up in recliner.  On 6 L oxygen .  Family at bedside Chart reviewed. Remains afebrile, blood pressure elevated to 160s, on 6 L oxygen  by nasal cannula  Assessment and plan: Persistent dyspnea Acute on chronic hypoxic respiratory failure H/o COPD, diastolic CHF Presented with persistent dyspnea for last 4 weeks Imaging without evidence of infiltrate, edema or effusion Echocardiogram from 5/5 with EF 60 to 65%, grade 2 diastolic dysfunction, mildly elevated PASP, moderately dilated atria Unclear etiology of persistent symptoms. Continue bronchodilators as needed Given history of CAD, cardiology was consulted on admission Seen by cardiologist Dr. Stann Earnest this morning.  Suggests pulmonary etiology of worsening symptoms. Recommend outpatient Lexiscan  Myoview  Given her high oxygen  requirement, I would continue to monitor her for next 24 hours, encourage ambulation with PT  CAD/CABG Continue aspirin , Zetia ,   Hypertension PTA meds- Imdur  30 mg daily, metoprolol  50 mg twice daily, amlodipine  10 mg  daily Currently continued on all.  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 PTA meds- prednisone  5 mg daily, tacrolimus  Creatinine at baseline as below Recent Labs    08/06/23 0341 08/07/23 0254 08/08/23 0630 03/22/24 1610 03/23/24 0336 03/24/24 1004 03/25/24 0546 04/09/24 1038 04/17/24 1646 04/18/24 0244  BUN 32* 48* 36* 25* 29* 41* 53* 26* 33* 30*  CREATININE 1.97* 2.50* 1.65* 1.60* 1.69* 1.77* 2.14* 1.22* 1.52* 1.38*    Type 2 diabetes mellitus A1c 6.1 on 12/24/2022 PTA meds-none Currently on SSI/Accu-Cheks Recent Labs  Lab 04/18/24 0731 04/18/24 1141  GLUCAP 91 183*   GERD PPI   Mobility: PT eval ordered  Goals of care   Code Status: Limited: Do not attempt resuscitation (DNR) -DNR-LIMITED -Do Not Intubate/DNI      DVT prophylaxis:  heparin  injection 5,000 Units Start: 04/18/24 0600   Antimicrobials: None Fluid: None Consultants: Cardiology Family Communication: Family at bedside  Status: Inpatient Level of care:  Telemetry Medical   Patient is from: Home Needs to continue in-hospital care: Remains on high flow oxygen , workup in progress Anticipated d/c to: Pending clinical course    Diet:  Diet Order             Diet heart healthy/carb modified Room service appropriate? Yes; Fluid consistency: Thin  Diet effective now                   Scheduled Meds:  amLODipine   10 mg Oral Daily   aspirin  EC  81 mg Oral Daily   budesonide -glycopyrrolate -formoterol  2 puff Inhalation BID   calcitRIOL   0.25 mcg Oral Daily   ezetimibe   10 mg Oral Daily   heparin   5,000 Units Subcutaneous Q8H   insulin  aspart  0-9 Units Subcutaneous TID WC   isosorbide  mononitrate  30 mg Oral Daily   metoprolol  tartrate  50 mg Oral BID   pantoprazole   40 mg Oral Daily   potassium chloride  SA  20 mEq Oral Daily   predniSONE   20 mg Oral Q breakfast   tacrolimus   4 mg Oral BID    PRN meds: albuterol    Infusions:     Antimicrobials: Anti-infectives (From admission, onward)    None       Objective: Vitals:   04/18/24 0925 04/18/24 1144  BP: (!) 155/62 125/60  Pulse:  (!) 58  Resp:  19  Temp:  97.8 F (36.6 C)  SpO2:  95%    Intake/Output Summary (Last 24 hours) at 04/18/2024 1152 Last data filed at 04/18/2024 0856 Gross per 24 hour  Intake 480 ml  Output --  Net 480 ml   Filed Weights   04/17/24 2200 04/17/24 2301  Weight: 55.3 kg 54.9 kg   Weight change:  Body mass index is 22.14 kg/m.   Physical Exam: General exam: Pleasant, elderly African-American female, thin built Skin: No rashes, lesions or ulcers. HEENT: Atraumatic, normocephalic, no obvious bleeding Lungs: Clear to auscultation bilaterally, no crackles or wheezing on my exam CVS: S1, S2, no murmur,   GI/Abd: Soft, nontender, nondistended, bowel sound present,   CNS: Alert, awake, oriented x 3 Psychiatry: Mood appropriate,  Extremities: No pedal edema, no calf tenderness,   Data Review: I have personally reviewed the laboratory data and studies available.  F/u labs  Unresulted Labs (From admission, onward)     Start     Ordered   04/18/24 0127  CBC  (heparin )  Once,   R       Comments: Baseline for heparin  therapy IF NOT ALREADY DRAWN.  Notify MD if PLT < 100 K.    04/18/24 0128            Admission date and time: 04/17/2024  4:31 PM    Signed, Hoyt Macleod, MD [facility] 04/18/2024

## 2024-04-18 NOTE — Care Management Obs Status (Signed)
 MEDICARE OBSERVATION STATUS NOTIFICATION   Patient Details  Name: [name] MRN: [id number] Date of Birth: [birth year]   Medicare Observation Status Notification Given:  Yes    MahabirThersia Flax, RN 04/18/2024, 12:57 PM

## 2024-04-19 ENCOUNTER — Observation Stay (HOSPITAL_COMMUNITY)

## 2024-04-19 DIAGNOSIS — E119 Type 2 diabetes mellitus without complications: Secondary | ICD-10-CM | POA: Diagnosis not present

## 2024-04-19 DIAGNOSIS — N186 End stage renal disease: Secondary | ICD-10-CM | POA: Diagnosis present

## 2024-04-19 DIAGNOSIS — R0609 Other forms of dyspnea: Secondary | ICD-10-CM | POA: Diagnosis not present

## 2024-04-19 DIAGNOSIS — Z7982 Long term (current) use of aspirin: Secondary | ICD-10-CM

## 2024-04-19 DIAGNOSIS — Z992 Dependence on renal dialysis: Secondary | ICD-10-CM | POA: Diagnosis not present

## 2024-04-19 DIAGNOSIS — J441 Chronic obstructive pulmonary disease with (acute) exacerbation: Secondary | ICD-10-CM | POA: Diagnosis present

## 2024-04-19 DIAGNOSIS — Z9981 Dependence on supplemental oxygen: Secondary | ICD-10-CM | POA: Diagnosis not present

## 2024-04-19 DIAGNOSIS — I132 Hypertensive heart and chronic kidney disease with heart failure and with stage 5 chronic kidney disease, or end stage renal disease: Secondary | ICD-10-CM | POA: Diagnosis present

## 2024-04-19 DIAGNOSIS — K219 Gastro-esophageal reflux disease without esophagitis: Secondary | ICD-10-CM | POA: Diagnosis present

## 2024-04-19 DIAGNOSIS — I252 Old myocardial infarction: Secondary | ICD-10-CM

## 2024-04-19 DIAGNOSIS — I2781 Cor pulmonale (chronic): Secondary | ICD-10-CM | POA: Diagnosis present

## 2024-04-19 DIAGNOSIS — Z7409 Other reduced mobility: Secondary | ICD-10-CM | POA: Diagnosis present

## 2024-04-19 DIAGNOSIS — I2729 Other secondary pulmonary hypertension: Secondary | ICD-10-CM | POA: Diagnosis present

## 2024-04-19 DIAGNOSIS — M19012 Primary osteoarthritis, left shoulder: Secondary | ICD-10-CM | POA: Diagnosis present

## 2024-04-19 DIAGNOSIS — E213 Hyperparathyroidism, unspecified: Secondary | ICD-10-CM | POA: Diagnosis present

## 2024-04-19 DIAGNOSIS — Z66 Do not resuscitate: Secondary | ICD-10-CM | POA: Diagnosis present

## 2024-04-19 DIAGNOSIS — Z8719 Personal history of other diseases of the digestive system: Secondary | ICD-10-CM

## 2024-04-19 DIAGNOSIS — Z951 Presence of aortocoronary bypass graft: Secondary | ICD-10-CM | POA: Diagnosis not present

## 2024-04-19 DIAGNOSIS — Z79621 Long term (current) use of calcineurin inhibitor: Secondary | ICD-10-CM | POA: Diagnosis not present

## 2024-04-19 DIAGNOSIS — R0602 Shortness of breath: Secondary | ICD-10-CM | POA: Diagnosis present

## 2024-04-19 DIAGNOSIS — Z87891 Personal history of nicotine dependence: Secondary | ICD-10-CM

## 2024-04-19 DIAGNOSIS — Z91018 Allergy to other foods: Secondary | ICD-10-CM

## 2024-04-19 DIAGNOSIS — Z1152 Encounter for screening for COVID-19: Secondary | ICD-10-CM

## 2024-04-19 DIAGNOSIS — Z7951 Long term (current) use of inhaled steroids: Secondary | ICD-10-CM

## 2024-04-19 DIAGNOSIS — I5032 Chronic diastolic (congestive) heart failure: Secondary | ICD-10-CM | POA: Diagnosis present

## 2024-04-19 DIAGNOSIS — J439 Emphysema, unspecified: Secondary | ICD-10-CM | POA: Diagnosis present

## 2024-04-19 DIAGNOSIS — J9621 Acute and chronic respiratory failure with hypoxia: Secondary | ICD-10-CM | POA: Diagnosis present

## 2024-04-19 DIAGNOSIS — Z79899 Other long term (current) drug therapy: Secondary | ICD-10-CM

## 2024-04-19 DIAGNOSIS — J449 Chronic obstructive pulmonary disease, unspecified: Secondary | ICD-10-CM | POA: Diagnosis not present

## 2024-04-19 DIAGNOSIS — R262 Difficulty in walking, not elsewhere classified: Secondary | ICD-10-CM | POA: Diagnosis present

## 2024-04-19 DIAGNOSIS — Z940 Kidney transplant status: Secondary | ICD-10-CM | POA: Diagnosis not present

## 2024-04-19 DIAGNOSIS — E1122 Type 2 diabetes mellitus with diabetic chronic kidney disease: Secondary | ICD-10-CM | POA: Diagnosis present

## 2024-04-19 DIAGNOSIS — D696 Thrombocytopenia, unspecified: Secondary | ICD-10-CM | POA: Diagnosis present

## 2024-04-19 DIAGNOSIS — Z96642 Presence of left artificial hip joint: Secondary | ICD-10-CM | POA: Diagnosis present

## 2024-04-19 DIAGNOSIS — I2510 Atherosclerotic heart disease of native coronary artery without angina pectoris: Secondary | ICD-10-CM | POA: Diagnosis present

## 2024-04-19 DIAGNOSIS — Z8614 Personal history of Methicillin resistant Staphylococcus aureus infection: Secondary | ICD-10-CM

## 2024-04-19 DIAGNOSIS — Z888 Allergy status to other drugs, medicaments and biological substances status: Secondary | ICD-10-CM

## 2024-04-19 DIAGNOSIS — M810 Age-related osteoporosis without current pathological fracture: Secondary | ICD-10-CM | POA: Diagnosis present

## 2024-04-19 DIAGNOSIS — R911 Solitary pulmonary nodule: Secondary | ICD-10-CM | POA: Diagnosis present

## 2024-04-19 DIAGNOSIS — M109 Gout, unspecified: Secondary | ICD-10-CM | POA: Diagnosis present

## 2024-04-19 DIAGNOSIS — Z8701 Personal history of pneumonia (recurrent): Secondary | ICD-10-CM

## 2024-04-19 DIAGNOSIS — I5030 Unspecified diastolic (congestive) heart failure: Secondary | ICD-10-CM | POA: Diagnosis not present

## 2024-04-19 DIAGNOSIS — E785 Hyperlipidemia, unspecified: Secondary | ICD-10-CM | POA: Diagnosis present

## 2024-04-19 DIAGNOSIS — E739 Lactose intolerance, unspecified: Secondary | ICD-10-CM | POA: Diagnosis present

## 2024-04-19 DIAGNOSIS — Z886 Allergy status to analgesic agent status: Secondary | ICD-10-CM

## 2024-04-19 DIAGNOSIS — Z90710 Acquired absence of both cervix and uterus: Secondary | ICD-10-CM

## 2024-04-19 LAB — GLUCOSE, CAPILLARY
Glucose-Capillary: 91 mg/dL (ref 70–99)
Glucose-Capillary: 134 mg/dL — ABNORMAL HIGH (ref 70–99)
Glucose-Capillary: 202 mg/dL — ABNORMAL HIGH (ref 70–99)
Glucose-Capillary: 167 mg/dL — ABNORMAL HIGH (ref 70–99)

## 2024-04-19 MED ORDER — ORAL CARE MOUTH RINSE: 15.0000 mL | OROMUCOSAL | Status: DC | PRN

## 2024-04-19 NOTE — Plan of Care (Signed)

## 2024-04-19 NOTE — Progress Notes (Signed)
 PROGRESS NOTE  GUILIANNA MCKOY  DOB: 1941/05/24  PCP: Claud Crumb, MD ZOX:[id number]  DOA: 04/17/2024  LOS: 0 days  Hospital Day: 3  Brief narrative: [name] is a 83 y.o. female with PMH significant for renal transplant status 2012, DM 2, HTN, HLD, CAD/CABG, asthma, GERD, gout, chronically on 6 L oxygen  5/29, patient presented to the ED with complaint of exertional shortness of breath Recently hospitalized 5/3 to 5/6 for similar symptoms attributed to CHF and COPD flareup.  Patient apparently has not felt improvement in her symptoms since then.  In the ED, afebrile, heart rate in 70s, blood pressure elevated to 170s, required 8 L oxygen  by nasal cannula. Labs with normal pH, normal pCO2 and VBG BUN/creatinine 33/1.52, proBNP elevated to over 4000, WBC normal, hemoglobin normal Chest x-ray unremarkable.   Admitted to [facility]  Subjective: Patient was seen and examined this morning.  Sitting up in recliner.   Just try to ambulate on the hallway with physical therapy.  She required 6 L oxygen  but soon dropped O2 sat to 80s on short sentence talking.  Does not feel back to baseline. Family not at bedside  Assessment and plan: Persistent dyspnea Acute on chronic hypoxic respiratory failure H/o COPD, diastolic CHF Presented with persistent dyspnea for last 4 weeks Imaging without evidence of infiltrate, edema or effusion Echocardiogram from 5/5 with EF 60 to 65%, grade 2 diastolic dysfunction, mildly elevated PASP, moderately dilated atria Unclear etiology of persistent symptoms. Continue bronchodilators as needed Given history of CAD, cardiology was consulted.  Per cardiology, symptoms less likely to be of cardiac origin.  Suggested outpatient stress test with Lexiscan  Myoview  On ambulation with PT today, patient continues to desat even on 6 L. Will obtain CT chest to look for any other cause. Unable to use contrast because of elevated creatinine and transplant kidney.  I would  also obtain a VQ scan to rule out PE  CAD/CABG Continue aspirin , Zetia ,   Hypertension PTA meds- Imdur  30 mg daily, metoprolol  50 mg twice daily, amlodipine  10 mg daily Currently continued on all.  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 PTA meds- prednisone  5 mg daily, tacrolimus  Creatinine at baseline as below. Recent Labs    08/06/23 0341 08/07/23 0254 08/08/23 0630 03/22/24 1610 03/23/24 0336 03/24/24 1004 03/25/24 0546 04/09/24 1038 04/17/24 1646 04/18/24 0244  BUN 32* 48* 36* 25* 29* 41* 53* 26* 33* 30*  CREATININE 1.97* 2.50* 1.65* 1.60* 1.69* 1.77* 2.14* 1.22* 1.52* 1.38*    Type 2 diabetes mellitus A1c 6.1 on 12/24/2022 PTA meds-none Currently on SSI/Accu-Cheks Recent Labs  Lab 04/18/24 1141 04/18/24 1608 04/18/24 2114 04/19/24 0725 04/19/24 1119  GLUCAP 183* 209* 150* 91 134*   GERD PPI   Mobility: PT eval obtained  Goals of care   Code Status: Limited: Do not attempt resuscitation (DNR) -DNR-LIMITED -Do Not Intubate/DNI      DVT prophylaxis:  heparin  injection 5,000 Units Start: 04/18/24 0600   Antimicrobials: None Fluid: None Consultants: Cardiology Family Communication: Family not at bedside today  Status: Inpatient Level of care:  Telemetry Medical   Patient is from: Home Needs to continue in-hospital care: Remains on high flow oxygen , workup in progress Anticipated d/c to: Pending clinical course    Diet:  Diet Order             Diet heart healthy/carb modified Room service appropriate? Yes; Fluid consistency: Thin  Diet effective now  Scheduled Meds:  amLODipine   10 mg Oral Daily   aspirin  EC  81 mg Oral Daily   budesonide -glycopyrrolate -formoterol   2 puff Inhalation BID   calcitRIOL   0.25 mcg Oral Daily   ezetimibe   10 mg Oral Daily   heparin   5,000 Units Subcutaneous Q8H   insulin  aspart  0-9 Units Subcutaneous TID WC   isosorbide  mononitrate  30 mg Oral Daily   metoprolol   tartrate  50 mg Oral BID   pantoprazole   40 mg Oral Daily   potassium chloride  SA  20 mEq Oral Daily   predniSONE   20 mg Oral Q breakfast   tacrolimus   4 mg Oral BID    PRN meds: albuterol    Infusions:    Antimicrobials: Anti-infectives (From admission, onward)    None       Objective: Vitals:   04/19/24 0838 04/19/24 1122  BP: (!) 148/60 (!) 136/57  Pulse: 65 (!) 58  Resp:  19  Temp:  98.2 F (36.8 C)  SpO2: 95% 93%    Intake/Output Summary (Last 24 hours) at 04/19/2024 1133 Last data filed at 04/19/2024 0843 Gross per 24 hour  Intake 360 ml  Output --  Net 360 ml   Filed Weights   04/17/24 2200 04/17/24 2301  Weight: 55.3 kg 54.9 kg   Weight change:  Body mass index is 22.14 kg/m.   Physical Exam: General exam: Pleasant, elderly African-American female, thin built Skin: No rashes, lesions or ulcers. HEENT: Atraumatic, normocephalic, no obvious bleeding Lungs: Winded, clear to auscultation bilaterally, mild crackles on left base CVS: S1, S2, no murmur,   GI/Abd: Soft, nontender, nondistended, bowel sound present,   CNS: Alert, awake, oriented x 3 Psychiatry: Mood appropriate,  Extremities: No pedal edema, no calf tenderness,   Data Review: I have personally reviewed the laboratory data and studies available.  F/u labs  Unresulted Labs (From admission, onward)     Start     Ordered   04/20/24 0500  CBC with Differential/Platelet  Tomorrow morning,   R        04/19/24 0749   04/20/24 0500  Basic metabolic panel with GFR  Tomorrow morning,   R        04/19/24 0749            Signed, Hoyt Macleod, MD [facility] 04/19/2024

## 2024-04-19 NOTE — Progress Notes (Signed)
     [facility] will sign off.    Please see consult note from Dr. Stann Earnest.  No new cardiology suggestions.  Please call with further questions.  Medication Recommendations:  NA Other recommendations (labs, testing, etc):  NA

## 2024-04-19 NOTE — Evaluation (Signed)
 Physical Therapy Evaluation Patient Details Name: [name] MRN: [id number] DOB: 06-Mar-1941 Today's Date: 04/19/2024  History of Present Illness  Patient is an 83 yo female admitted on with acute on chronic respiratory failure. PMH significant for COPD on 6L home O2, CAD, CHF, asthma, hx of CABG, ESRD s/p transplant in 2012 on immunosuppression, T2DM, and gout.  Clinical Impression  Pt admitted as above and presenting with functional mobility limitations 2* generalized weakness, balance deficits and activity tolerance limited by SOB.  This am, pt up to ambulate 69' with standing rest breaks and with pt noted to desat to 86% on 6L but with cueing to limited talking, pt able to maintain sats at 90-91% on 6L.  Pt hopes to progress to dc home with assist of family.        If plan is discharge home, recommend the following: A little help with walking and/or transfers;A little help with bathing/dressing/bathroom;Assistance with cooking/housework;Assist for transportation;Help with stairs or ramp for entrance   Can travel by private vehicle        Equipment Recommendations None recommended by PT  Recommendations for Other Services       Functional Status Assessment Patient has had a recent decline in their functional status and demonstrates the ability to make significant improvements in function in a reasonable and predictable amount of time.     Precautions / Restrictions Precautions Precautions: Fall;Other (comment) Recall of Precautions/Restrictions: Intact Precaution/Restrictions Comments: Monitor sats Restrictions Weight Bearing Restrictions Per Provider Order: No      Mobility  Bed Mobility Overal bed mobility: Needs Assistance Bed Mobility: Supine to Sit     Supine to sit: Supervision     General bed mobility comments: for safety only    Transfers Overall transfer level: Needs assistance Equipment used: Rolling walker (2 wheels) Transfers: Sit to/from  Stand Sit to Stand: Contact guard assist           General transfer comment: Steady assist only    Ambulation/Gait Ambulation/Gait assistance: Contact guard assist Gait Distance (Feet): 65 Feet Assistive device: Rolling walker (2 wheels) Gait Pattern/deviations: Step-to pattern, Step-through pattern, Decreased step length - right, Decreased step length - left, Shuffle, Trunk flexed Gait velocity: decr     General Gait Details: Cues for posture, position from RW and to limit talking during activity to maintain Sats  Stairs            Wheelchair Mobility     Tilt Bed    Modified Rankin (Stroke Patients Only)       Balance Overall balance assessment: Needs assistance Sitting-balance support: Feet supported, No upper extremity supported Sitting balance-Leahy Scale: Good     Standing balance support: Bilateral upper extremity supported Standing balance-Leahy Scale: Poor                               Pertinent Vitals/Pain Pain Assessment Pain Assessment: No/denies pain    Home Living Family/patient expects to be discharged to:: Private residence Living Arrangements: Other (Comment) (Grandson at night, Grandson's fiance during the day) Available Help at Discharge: Family;Available 24 hours/day Type of Home: House Home Access: Ramped entrance       Home Layout: One level Home Equipment: Hand held shower head;Cane - single point;Rollator (4 wheels);Other (comment);Shower seat;BSC/3in1;Electric scooter Additional Comments: Reports she just recently got an Art gallery manager and uses around the home sometimes but does not have the ramp for car yet for use  outside of home.    Prior Function Prior Level of Function : Independent/Modified Independent;Driving             Mobility Comments: Patient uses 4WW for household ambulation and occasionally uses electric scooter. Has home O2 and portable tank. ADLs Comments: Ind with ADLs/selfcare, cooks,  IT sales professional. Grandson will assist with tub shower transfers. Pt reports that ADLs requring extra time to complete due to SOB and fatigue     Extremity/Trunk Assessment   Upper Extremity Assessment Upper Extremity Assessment: Overall WFL for tasks assessed    Lower Extremity Assessment Lower Extremity Assessment: Generalized weakness       Communication   Communication Communication: No apparent difficulties    Cognition Arousal: Alert Behavior During Therapy: WFL for tasks assessed/performed   PT - Cognitive impairments: No apparent impairments                         Following commands: Intact       Cueing Cueing Techniques: Verbal cues     General Comments      Exercises     Assessment/Plan    PT Assessment Patient needs continued PT services  PT Problem List Decreased strength;Decreased range of motion;Decreased balance;Decreased activity tolerance;Decreased mobility;Decreased knowledge of use of DME;Pain       PT Treatment Interventions DME instruction;Gait training;Stair training;Functional mobility training;Therapeutic activities;Therapeutic exercise;Balance training;Patient/family education    PT Goals (Current goals can be found in the Care Plan section)  Acute Rehab PT Goals Patient Stated Goal: Regain IND and improve breathing PT Goal Formulation: With patient Time For Goal Achievement: 05/02/24 Potential to Achieve Goals: Fair    Frequency Min 3X/week     Co-evaluation               AM-PAC PT "6 Clicks" Mobility  Outcome Measure Help needed turning from your back to your side while in a flat bed without using bedrails?: None Help needed moving from lying on your back to sitting on the side of a flat bed without using bedrails?: A Little Help needed moving to and from a bed to a chair (including a wheelchair)?: A Little Help needed standing up from a chair using your arms (e.g., wheelchair or bedside chair)?: A Little Help  needed to walk in hospital room?: A Little Help needed climbing 3-5 steps with a railing? : A Little 6 Click Score: 19    End of Session Equipment Utilized During Treatment: Gait belt;Oxygen  Activity Tolerance: Patient tolerated treatment well Patient left: in chair;with call bell/phone within reach;with chair alarm set Nurse Communication: Mobility status PT Visit Diagnosis: Difficulty in walking, not elsewhere classified (R26.2)    Time: 1100-1118 PT Time Calculation (min) (ACUTE ONLY): 18 min   Charges:   PT Evaluation $PT Eval Low Complexity: 1 Low   PT General Charges $$ ACUTE PT VISIT: 1 Visit         Thedora Finlay PT Acute Rehabilitation Services Pager [phone] Office [phone]    Nesha Counihan 04/19/2024, 4:13 PM

## 2024-04-20 ENCOUNTER — Inpatient Hospital Stay (HOSPITAL_COMMUNITY)

## 2024-04-20 DIAGNOSIS — R0609 Other forms of dyspnea: Secondary | ICD-10-CM | POA: Diagnosis not present

## 2024-04-20 LAB — CBC WITH DIFFERENTIAL/PLATELET
Abs Immature Granulocytes: 0.32 10*3/uL — ABNORMAL HIGH (ref 0.00–0.07)
Basophils Absolute: 0 10*3/uL (ref 0.0–0.1)
Basophils Relative: 0 %
Eosinophils Absolute: 0 10*3/uL (ref 0.0–0.5)
Eosinophils Relative: 0 %
HCT: 35.6 % — ABNORMAL LOW (ref 36.0–46.0)
Hemoglobin: 11.7 g/dL — ABNORMAL LOW (ref 12.0–15.0)
Immature Granulocytes: 4 %
Lymphocytes Relative: 18 %
Lymphs Abs: 1.5 10*3/uL (ref 0.7–4.0)
MCH: 24.1 pg — ABNORMAL LOW (ref 26.0–34.0)
MCHC: 32.9 g/dL (ref 30.0–36.0)
MCV: 73.4 fL — ABNORMAL LOW (ref 80.0–100.0)
Monocytes Absolute: 1 10*3/uL (ref 0.1–1.0)
Monocytes Relative: 12 %
Neutro Abs: 5.5 10*3/uL (ref 1.7–7.7)
Neutrophils Relative %: 66 %
Platelets: 136 10*3/uL — ABNORMAL LOW (ref 150–400)
RBC: 4.85 MIL/uL (ref 3.87–5.11)
RDW: 18.6 % — ABNORMAL HIGH (ref 11.5–15.5)
WBC: 8.3 10*3/uL (ref 4.0–10.5)
nRBC: 0 % (ref 0.0–0.2)

## 2024-04-20 LAB — BASIC METABOLIC PANEL WITH GFR
Anion gap: 4 — ABNORMAL LOW (ref 5–15)
BUN: 40 mg/dL — ABNORMAL HIGH (ref 8–23)
CO2: 27 mmol/L (ref 22–32)
Calcium: 9.5 mg/dL (ref 8.9–10.3)
Chloride: 107 mmol/L (ref 98–111)
Creatinine, Ser: 1.39 mg/dL — ABNORMAL HIGH (ref 0.44–1.00)
GFR, Estimated: 38 mL/min — ABNORMAL LOW (ref >60)
Glucose, Bld: 110 mg/dL — ABNORMAL HIGH (ref 70–99)
Potassium: 4.2 mmol/L (ref 3.5–5.1)
Sodium: 138 mmol/L (ref 135–145)

## 2024-04-20 LAB — GLUCOSE, CAPILLARY
Glucose-Capillary: 92 mg/dL (ref 70–99)
Glucose-Capillary: 100 mg/dL — ABNORMAL HIGH (ref 70–99)
Glucose-Capillary: 277 mg/dL — ABNORMAL HIGH (ref 70–99)
Glucose-Capillary: 164 mg/dL — ABNORMAL HIGH (ref 70–99)

## 2024-04-20 MED ORDER — TORSEMIDE 20 MG PO TABS
40.0000 mg | ORAL_TABLET | Freq: Every day | ORAL | Status: DC
  Administered 2024-04-20: 40 mg via ORAL
  Filled 2024-04-20 (×2): qty 2

## 2024-04-20 NOTE — Progress Notes (Signed)
 PROGRESS NOTE  [name]  DOB: 1941-04-05  PCP: Claud Crumb, MD PIR:[id number]  DOA: 04/17/2024  LOS: 1 day  Hospital Day: 4  Brief narrative: URA [name] is a 83 y.o. female with PMH significant for renal transplant status 2012, DM 2, HTN, HLD, CAD/CABG, asthma, GERD, gout, chronically on 6 L oxygen  5/29, patient presented to the ED with complaint of exertional shortness of breath Recently hospitalized 5/3 to 5/6 for similar symptoms attributed to CHF and COPD flareup.  Patient apparently has not felt improvement in her symptoms since then.  In the ED, afebrile, heart rate in 70s, blood pressure elevated to 170s, required 8 L oxygen  by nasal cannula. Labs with normal pH, normal pCO2 and VBG BUN/creatinine 33/1.52, proBNP elevated to over 4000, WBC normal, hemoglobin normal Chest x-ray unremarkable.   Admitted to [facility]   Subjective: Patient was seen and examined this morning.  Sitting up in recliner.   Remains on 6 L oxygen  at rest.  Continues to have difficulty in walking to the bathroom. No family at bedside.  Assessment and plan: Persistent dyspnea Acute on chronic hypoxic respiratory failure H/o COPD, diastolic CHF Presented with persistent dyspnea for last 4 weeks Imaging without evidence of infiltrate, edema or effusion Echocardiogram from 5/5 with EF 60 to 65%, grade 2 diastolic dysfunction, mildly elevated PASP, moderately dilated atria Unclear etiology of persistent symptoms. CT chest 5/31 did not show any acute cardiopulmonary process, showed distended pulmonary trunk suggesting of underlying pulm artery hypertension, showed known emphysema.  No wheezing to suggest COPD exacerbation. Given mildly elevated PASP on echo and enlarged pulmonary trunk and CT scan, she may benefit from gentle diuresis.  Will start on torsemide 40 mg this morning.  Would avoid IV because of transplant kidney status Continue bronchodilators as needed Also requested for VQ scan to  rule out pulm embolism. To ambulate with PT again to monitor any change in ambulatory oxygen  requirement  CAD/CABG Continue aspirin , Zetia ,  Given history of CAD, cardiology was consulted.  Per cardiology, symptoms less likely to be of cardiac origin.  Suggested outpatient stress test with Lexiscan  Myoview   Hypertension PTA meds- Imdur  30 mg daily, metoprolol  50 mg twice daily, amlodipine  10 mg daily Currently continued on all.  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 PTA meds- prednisone  5 mg daily, tacrolimus  Creatinine at baseline as below. Recent Labs    08/07/23 0254 08/08/23 0630 03/22/24 1610 03/23/24 0336 03/24/24 1004 03/25/24 0546 04/09/24 1038 04/17/24 1646 04/18/24 0244 04/20/24 0516  BUN 48* 36* 25* 29* 41* 53* 26* 33* 30* 40*  CREATININE 2.50* 1.65* 1.60* 1.69* 1.77* 2.14* 1.22* 1.52* 1.38* 1.39*   Type 2 diabetes mellitus A1c 6.1 on 12/24/2022 PTA meds-none Currently on SSI/Accu-Cheks Recent Labs  Lab 04/19/24 0725 04/19/24 1119 04/19/24 1638 04/19/24 2043 04/20/24 0726  GLUCAP 91 134* 202* 167* 92   GERD PPI  Pulm nodule CT chest 5/31 showed a 4 mm right middle lobe pulmonary nodule.  To follow-up with PCP as an outpatient further imaging   Mobility: PT eval obtained  Goals of care   Code Status: Limited: Do not attempt resuscitation (DNR) -DNR-LIMITED -Do Not Intubate/DNI      DVT prophylaxis:  heparin  injection 5,000 Units Start: 04/18/24 0600   Antimicrobials: None Fluid: None Consultants: Cardiology Family Communication: Family not at bedside today  Status: Inpatient Level of care:  Telemetry Medical   Patient is from: Home Needs to continue in-hospital care: Remains on high flow  oxygen , workup in progress Anticipated d/c to: Pending clinical course    Diet:  Diet Order             Diet heart healthy/carb modified Room service appropriate? Yes; Fluid consistency: Thin  Diet effective now                    Scheduled Meds:  amLODipine   10 mg Oral Daily   aspirin  EC  81 mg Oral Daily   budesonide -glycopyrrolate -formoterol   2 puff Inhalation BID   calcitRIOL   0.25 mcg Oral Daily   ezetimibe   10 mg Oral Daily   heparin   5,000 Units Subcutaneous Q8H   insulin  aspart  0-9 Units Subcutaneous TID WC   isosorbide  mononitrate  30 mg Oral Daily   metoprolol  tartrate  50 mg Oral BID   pantoprazole   40 mg Oral Daily   potassium chloride  SA  20 mEq Oral Daily   predniSONE   20 mg Oral Q breakfast   tacrolimus   4 mg Oral BID   torsemide  40 mg Oral Daily    PRN meds: albuterol , mouth rinse   Infusions:    Antimicrobials: Anti-infectives (From admission, onward)    None       Objective: Vitals:   04/20/24 0526 04/20/24 0838  BP: (!) 153/60   Pulse: 65   Resp: 18   Temp: 97.8 F (36.6 C)   SpO2: 97% 97%    Intake/Output Summary (Last 24 hours) at 04/20/2024 1111 Last data filed at 04/20/2024 0833 Gross per 24 hour  Intake 840 ml  Output --  Net 840 ml   Filed Weights   04/17/24 2200 04/17/24 2301  Weight: 55.3 kg 54.9 kg   Weight change:  Body mass index is 22.14 kg/m.   Physical Exam: General exam: Pleasant, elderly African-American female, thin built Skin: No rashes, lesions or ulcers. HEENT: Atraumatic, normocephalic, no obvious bleeding Lungs: Winded, clear to auscultation bilaterally, mild crackles on left base.  No wheezing CVS: S1, S2, no murmur,   GI/Abd: Soft, nontender, nondistended, bowel sound present,   CNS: Alert, awake, oriented x 3 Psychiatry: Mood appropriate,  Extremities: No pedal edema, no calf tenderness,   Data Review: I have personally reviewed the laboratory data and studies available.  F/u labs  Unresulted Labs (From admission, onward)     Start     Ordered   Unscheduled  Basic metabolic panel with GFR  Tomorrow morning,   R        04/20/24 1111   Unscheduled  CBC with Differential/Platelet  Tomorrow morning,   R        04/20/24 1111             Signed, Hoyt Macleod, MD [facility] 04/20/2024

## 2024-04-20 NOTE — Progress Notes (Signed)
 Physical Therapy Treatment Patient Details Name: [name] MRN: [id number] DOB: 1941-03-18 Today's Date: 04/20/2024   History of Present Illness Patient is an 83 yo female admitted on with acute on chronic respiratory failure. PMH significant for COPD on 6L home O2, CAD, CHF, asthma, hx of CABG, ESRD s/p transplant in 2012 on immunosuppression, T2DM, and gout.    PT Comments  Pt in recliner on arrival to room and reports MD already in to see her this morning and wanting her to ambulate (RN also okay with pt ambulating despite pending V/Q scan).  Pt able to tolerate 130 ft with RW and denies dyspnea.  Pt's SPO2 monitored throughout ambulation and mostly 95% on 6L HFNC, briefly dropped to 91% upon return to recliner however quickly back into mid90s.    If plan is discharge home, recommend the following: A little help with walking and/or transfers;A little help with bathing/dressing/bathroom;Assistance with cooking/housework;Assist for transportation;Help with stairs or ramp for entrance   Can travel by private vehicle        Equipment Recommendations  None recommended by PT    Recommendations for Other Services       Precautions / Restrictions Precautions Precautions: Fall;Other (comment) Recall of Precautions/Restrictions: Intact Precaution/Restrictions Comments: Monitor sats, 6L baseline     Mobility  Bed Mobility               General bed mobility comments: pt in recliner    Transfers Overall transfer level: Needs assistance Equipment used: Rolling walker (2 wheels) Transfers: Sit to/from Stand Sit to Stand: Contact guard assist, Supervision           General transfer comment: for lines    Ambulation/Gait Ambulation/Gait assistance: Contact guard assist, Supervision Gait Distance (Feet): 130 Feet Assistive device: Rolling walker (2 wheels) Gait Pattern/deviations: Step-through pattern, Decreased stride length Gait velocity: decr     General Gait  Details: verbal cues for RW positioning, monitored SPO2 throughout ambulation and mostly 95% on 6L HFNC, briefly dropped to 91% upon return to recliner however quickly back into mid90s; pt did not talk during ambulation today and denies any dyspnea, only reports feeling fatigued   Stairs             Wheelchair Mobility     Tilt Bed    Modified Rankin (Stroke Patients Only)       Balance                                            Communication Communication Communication: No apparent difficulties  Cognition Arousal: Alert Behavior During Therapy: WFL for tasks assessed/performed   PT - Cognitive impairments: No apparent impairments                         Following commands: Intact      Cueing Cueing Techniques: Verbal cues  Exercises      General Comments        Pertinent Vitals/Pain Pain Assessment Pain Assessment: No/denies pain    Home Living                          Prior Function            PT Goals (current goals can now be found in the care plan section) Progress towards PT goals: Progressing toward  goals    Frequency    Min 2X/week      PT Plan      Co-evaluation              AM-PAC PT "6 Clicks" Mobility   Outcome Measure  Help needed turning from your back to your side while in a flat bed without using bedrails?: None Help needed moving from lying on your back to sitting on the side of a flat bed without using bedrails?: A Little Help needed moving to and from a bed to a chair (including a wheelchair)?: A Little Help needed standing up from a chair using your arms (e.g., wheelchair or bedside chair)?: A Little Help needed to walk in hospital room?: A Little Help needed climbing 3-5 steps with a railing? : A Little 6 Click Score: 19    End of Session Equipment Utilized During Treatment: Oxygen  Activity Tolerance: Patient tolerated treatment well Patient left: in chair;with call  bell/phone within reach;with family/visitor present;with chair alarm set Nurse Communication: Mobility status PT Visit Diagnosis: Difficulty in walking, not elsewhere classified (R26.2)     Time: 1130-1144 PT Time Calculation (min) (ACUTE ONLY): 14 min  Charges:    $Gait Training: 8-22 mins PT General Charges $$ ACUTE PT VISIT: 1 Visit                    Blanch Bunde, DPT Physical Therapist Acute Rehabilitation Services Office: [phone]    Myna Asal Payson 04/20/2024, 12:51 PM

## 2024-04-21 DIAGNOSIS — R0609 Other forms of dyspnea: Secondary | ICD-10-CM | POA: Diagnosis not present

## 2024-04-21 DIAGNOSIS — J449 Chronic obstructive pulmonary disease, unspecified: Secondary | ICD-10-CM

## 2024-04-21 DIAGNOSIS — I2781 Cor pulmonale (chronic): Secondary | ICD-10-CM | POA: Diagnosis not present

## 2024-04-21 DIAGNOSIS — E119 Type 2 diabetes mellitus without complications: Secondary | ICD-10-CM | POA: Diagnosis not present

## 2024-04-21 DIAGNOSIS — K219 Gastro-esophageal reflux disease without esophagitis: Secondary | ICD-10-CM

## 2024-04-21 DIAGNOSIS — I5030 Unspecified diastolic (congestive) heart failure: Secondary | ICD-10-CM

## 2024-04-21 LAB — CBC WITH DIFFERENTIAL/PLATELET
Abs Immature Granulocytes: 0.21 10*3/uL — ABNORMAL HIGH (ref 0.00–0.07)
Basophils Absolute: 0 10*3/uL (ref 0.0–0.1)
Basophils Relative: 0 %
Eosinophils Absolute: 0 10*3/uL (ref 0.0–0.5)
Eosinophils Relative: 0 %
HCT: 36.3 % (ref 36.0–46.0)
Hemoglobin: 12 g/dL (ref 12.0–15.0)
Immature Granulocytes: 2 %
Lymphocytes Relative: 17 %
Lymphs Abs: 1.5 10*3/uL (ref 0.7–4.0)
MCH: 24 pg — ABNORMAL LOW (ref 26.0–34.0)
MCHC: 33.1 g/dL (ref 30.0–36.0)
MCV: 72.5 fL — ABNORMAL LOW (ref 80.0–100.0)
Monocytes Absolute: 0.9 10*3/uL (ref 0.1–1.0)
Monocytes Relative: 11 %
Neutro Abs: 6.3 10*3/uL (ref 1.7–7.7)
Neutrophils Relative %: 70 %
Platelets: 141 10*3/uL — ABNORMAL LOW (ref 150–400)
RBC: 5.01 MIL/uL (ref 3.87–5.11)
RDW: 18.9 % — ABNORMAL HIGH (ref 11.5–15.5)
WBC: 9 10*3/uL (ref 4.0–10.5)
nRBC: 0 % (ref 0.0–0.2)

## 2024-04-21 LAB — BASIC METABOLIC PANEL WITH GFR
Anion gap: 8 (ref 5–15)
BUN: 48 mg/dL — ABNORMAL HIGH (ref 8–23)
CO2: 28 mmol/L (ref 22–32)
Calcium: 9.2 mg/dL (ref 8.9–10.3)
Chloride: 101 mmol/L (ref 98–111)
Creatinine, Ser: 1.79 mg/dL — ABNORMAL HIGH (ref 0.44–1.00)
GFR, Estimated: 28 mL/min — ABNORMAL LOW (ref >60)
Glucose, Bld: 111 mg/dL — ABNORMAL HIGH (ref 70–99)
Potassium: 3.9 mmol/L (ref 3.5–5.1)
Sodium: 137 mmol/L (ref 135–145)

## 2024-04-21 LAB — GLUCOSE, CAPILLARY
Glucose-Capillary: 95 mg/dL (ref 70–99)
Glucose-Capillary: 126 mg/dL — ABNORMAL HIGH (ref 70–99)
Glucose-Capillary: 229 mg/dL — ABNORMAL HIGH (ref 70–99)
Glucose-Capillary: 123 mg/dL — ABNORMAL HIGH (ref 70–99)

## 2024-04-21 MED ORDER — TORSEMIDE 20 MG PO TABS
20.0000 mg | ORAL_TABLET | Freq: Every day | ORAL | Status: DC
  Administered 2024-04-21 – 2024-04-22 (×2): 20 mg via ORAL
  Filled 2024-04-21 (×2): qty 1

## 2024-04-21 NOTE — Progress Notes (Signed)
 PROGRESS NOTE  [name]  DOB: 1940-12-14  PCP: Claud Crumb, MD ZOX:[id number]  DOA: 04/17/2024  LOS: 2 days  Hospital Day: 5  Brief narrative: [name] is a 83 y.o. female with PMH significant for renal transplant status 2012, DM 2, HTN, HLD, CAD/CABG, asthma, GERD, gout, chronically on 6 L oxygen  5/29, patient presented to the ED with complaint of exertional shortness of breath Recently hospitalized 5/3 to 5/6 for similar symptoms attributed to CHF and COPD flareup.  Patient apparently has not felt improvement in her symptoms since then.  In the ED, afebrile, heart rate in 70s, blood pressure elevated to 170s, required 8 L oxygen  by nasal cannula. Labs with normal pH, normal pCO2 and VBG BUN/creatinine 33/1.52, proBNP elevated to over 4000, WBC normal, hemoglobin normal Chest x-ray unremarkable.   Admitted to [facility]  Subjective: Patient was seen and examined this morning.  Sitting up in recliner.   Still remains on 6 L of oxygen . Discussed CT scan and V/Q scan findings. Given 1 dose of torsemide 40 mg yesterday with rise in creatinine today  Assessment and plan: Persistent dyspnea Acute on chronic hypoxic respiratory failure H/o COPD, diastolic CHF Presented with persistent dyspnea for last 4 weeks Imaging without evidence of infiltrate, edema or effusion Echocardiogram from 5/5 with EF 60 to 65%, grade 2 diastolic dysfunction, mildly elevated PASP, moderately dilated atria CT chest 5/31 did not show any acute cardiopulmonary process, showed distended pulmonary trunk suggesting of underlying pulm artery hypertension, showed known emphysema.   V/Q scan negative for pulm embolism 6/1, 1 dose of torsemide 40 mg was given due to elevated PASP on echo, elevated BNP  Creatinine increased to 1.79 today. Pulmonary consulted  CAD/CABG Continue aspirin , Zetia ,  Given history of CAD, cardiology was consulted.  Per cardiology, symptoms less likely to be of cardiac origin.   Suggested outpatient stress test with Lexiscan  Myoview   Hypertension PTA meds- Imdur  30 mg daily, metoprolol  50 mg twice daily, amlodipine  10 mg daily Currently continued on all.  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 PTA meds- prednisone  5 mg daily, tacrolimus  Continue to monitor creatinine Recent Labs    08/08/23 0630 03/22/24 1610 03/23/24 0336 03/24/24 1004 03/25/24 0546 04/09/24 1038 04/17/24 1646 04/18/24 0244 04/20/24 0516 04/21/24 0423  BUN 36* 25* 29* 41* 53* 26* 33* 30* 40* 48*  CREATININE 1.65* 1.60* 1.69* 1.77* 2.14* 1.22* 1.52* 1.38* 1.39* 1.79*   Type 2 diabetes mellitus A1c 6.1 on 12/24/2022 PTA meds-none Currently on SSI/Accu-Cheks Recent Labs  Lab 04/20/24 1134 04/20/24 1607 04/20/24 2144 04/21/24 0757 04/21/24 1209  GLUCAP 100* 277* 164* 95 126*   GERD PPI  Pulm nodule CT chest 5/31 showed a 4 mm right middle lobe pulmonary nodule.  To follow-up with PCP as an outpatient further imaging   Mobility: PT eval obtained  Goals of care   Code Status: Limited: Do not attempt resuscitation (DNR) -DNR-LIMITED -Do Not Intubate/DNI      DVT prophylaxis:  heparin  injection 5,000 Units Start: 04/18/24 0600   Antimicrobials: None Fluid: None Consultants: Cardiology Family Communication: Family not at bedside today  Status: Inpatient Level of care:  Telemetry Medical   Patient is from: Home Needs to continue in-hospital care: Remains on high flow oxygen , workup in progress.  Pulmonary consulted Anticipated d/c to: Pending clinical course    Diet:  Diet Order             Diet heart healthy/carb modified Room service appropriate?  Yes; Fluid consistency: Thin  Diet effective now                   Scheduled Meds:  amLODipine   10 mg Oral Daily   aspirin  EC  81 mg Oral Daily   budesonide -glycopyrrolate -formoterol   2 puff Inhalation BID   calcitRIOL   0.25 mcg Oral Daily   ezetimibe   10 mg Oral Daily   heparin   5,000  Units Subcutaneous Q8H   insulin  aspart  0-9 Units Subcutaneous TID WC   isosorbide  mononitrate  30 mg Oral Daily   metoprolol  tartrate  50 mg Oral BID   pantoprazole   40 mg Oral Daily   potassium chloride  SA  20 mEq Oral Daily   predniSONE   20 mg Oral Q breakfast   tacrolimus   4 mg Oral BID   torsemide  20 mg Oral Daily    PRN meds: albuterol , mouth rinse   Infusions:    Antimicrobials: Anti-infectives (From admission, onward)    None       Objective: Vitals:   04/21/24 0928 04/21/24 0930  BP:    Pulse:    Resp:    Temp:    SpO2: 98% 98%    Intake/Output Summary (Last 24 hours) at 04/21/2024 1238 Last data filed at 04/21/2024 0100 Gross per 24 hour  Intake 360 ml  Output 300 ml  Net 60 ml   Filed Weights   04/17/24 2200 04/17/24 2301  Weight: 55.3 kg 54.9 kg   Weight change:  Body mass index is 22.14 kg/m.   Physical Exam: General exam: Pleasant, elderly African-American female, thin built Skin: No rashes, lesions or ulcers. HEENT: Atraumatic, normocephalic, no obvious bleeding Lungs: Winded, clear to auscultation bilaterally, no wheezing remains on 6 L oxygen  CVS: S1, S2, no murmur,   GI/Abd: Soft, nontender, nondistended, bowel sound present,   CNS: Alert, awake, oriented x 3 Psychiatry: Mood appropriate,  Extremities: No pedal edema, no calf tenderness,   Data Review: I have personally reviewed the laboratory data and studies available.  F/u labs  Unresulted Labs (From admission, onward)     Start     Ordered   04/22/24 0500  CBC with Differential/Platelet  Tomorrow morning,   R        04/21/24 1238   04/22/24 0500  Basic metabolic panel with GFR  Tomorrow morning,   R        04/21/24 1238            Signed, Hoyt Macleod, MD [facility] 04/21/2024

## 2024-04-21 NOTE — Consult Note (Addendum)
   NAME:  [name], MRN:  [id number], DOB:  [birth year], LOS: 2 ADMISSION DATE:  04/17/2024, CONSULTATION DATE: 04/21/2024 REFERRING MD: Dr. Gwynneth Lessen, CHIEF COMPLAINT: COPD exacerbation, shortness of breath  History of Present Illness:  Patient admitted with worsening shortness of breath, recently hospitalized for similar symptoms  History of renal transplant in 2012-follows up at [facility], history of diabetes, hypertension, hyperlipidemia, coronary artery disease/CABG, asthma, GERD On oxygen  chronically  Quit smoking over 20 years ago Exposure to formaldehyde, some other irritants during her work history  Pertinent  Medical History   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, hold ASA 2-3 weeks - consider plavix instead of ASA   Asthma    Cellulitis 12/19/2019   right upper arm   Coronary artery disease    cath 11/2007, grafts patent /  Nuclear, June, 2011, prior inferior MI with mild peri-infarct ischemia, anterior breast attenuation, EF 67%, done for renal transplant assessment / Low level exercise/Lexiscan  Myoview  (11/2013): EF 67%, no ischemi; normal study   Diabetes mellitus    type 2   ESRD on dialysis [facility])    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, 2012   Family history of breast cancer    Family history of prostate cancer    GERD (gastroesophageal reflux disease)    GI bleed 11/26/2010   January, 2012 , AVM   Gout    History of methicillin resistant staphylococcus aureus (MRSA)    Hyperlipidemia    Hyperparathyroidism    Hypertension    Myocardial infarction [facility])    Osteoporosis    Pneumonia 08/2010    Hospitalization, October, 2011   Primary osteoarthritis, 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   S/P kidney transplant    September, 2012, [facility]   Subacromial impingement of 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   Significant Hospital Events: Including procedures, antibiotic start and stop dates in addition to other pertinent events   CT chest  reviewed-extensive emphysema Echocardiogram-result reviewed with evidence of stress on the right side of the heart mildly elevated pulmonary artery systolic pressure with moderately dilated atria  Interim History / Subjective:  She feels relatively well at present Denies any chest pain or chest discomfort No cough  Objective    Blood pressure (!) 147/63, pulse 66, temperature 98.4 F (36.9 C), temperature source Oral, resp. rate 18, height 5\' 2"  (1.575 m), weight 54.9 kg, last menstrual period 03/04/2022, SpO2 98%.        Intake/Output Summary (Last 24 hours) at 04/21/2024 0949 Last data filed at 04/21/2024 0100 Gross per 24 hour  Intake 600 ml  Output 300 ml  Net 300 ml   Filed Weights   04/17/24 2200 04/17/24 2301  Weight: 55.3 kg 54.9 kg    Examination: General: Elderly, does not appear to be in distress HENT: Moist oral mucosa Lungs: Decreased air movement bilaterally Cardiovascular: S1-S2 appreciated Abdomen: Soft, bowel sounds appreciated  Resolved problem list   Assessment and Plan   Advanced chronic obstructive pulmonary disease - Continue bronchodilator treatments  Diastolic heart failure  Cor pulmonale - Cautious diuresis with close monitoring of renal parameters - Still needs fluid optimization - Torsemide at a lower dose-ordered 20 mg - BNP over a thousand  Continue to optimize treatment for COPD  Does not appear to have a infectious exacerbation   History of renal transplant Type 2 diabetes GERD  Myer Artis, MD [place] [facility] Pager: See Tilford Foley

## 2024-04-22 DIAGNOSIS — J449 Chronic obstructive pulmonary disease, unspecified: Secondary | ICD-10-CM | POA: Diagnosis not present

## 2024-04-22 DIAGNOSIS — I2781 Cor pulmonale (chronic): Secondary | ICD-10-CM | POA: Diagnosis not present

## 2024-04-22 DIAGNOSIS — E119 Type 2 diabetes mellitus without complications: Secondary | ICD-10-CM | POA: Diagnosis not present

## 2024-04-22 DIAGNOSIS — I5030 Unspecified diastolic (congestive) heart failure: Secondary | ICD-10-CM | POA: Diagnosis not present

## 2024-04-22 DIAGNOSIS — R0609 Other forms of dyspnea: Secondary | ICD-10-CM | POA: Diagnosis not present

## 2024-04-22 LAB — CBC WITH DIFFERENTIAL/PLATELET
Abs Immature Granulocytes: 0.16 10*3/uL — ABNORMAL HIGH (ref 0.00–0.07)
Basophils Absolute: 0 10*3/uL (ref 0.0–0.1)
Basophils Relative: 0 %
Eosinophils Absolute: 0 10*3/uL (ref 0.0–0.5)
Eosinophils Relative: 0 %
HCT: 36.1 % (ref 36.0–46.0)
Hemoglobin: 12.1 g/dL (ref 12.0–15.0)
Immature Granulocytes: 2 %
Lymphocytes Relative: 17 %
Lymphs Abs: 1.6 10*3/uL (ref 0.7–4.0)
MCH: 24.3 pg — ABNORMAL LOW (ref 26.0–34.0)
MCHC: 33.5 g/dL (ref 30.0–36.0)
MCV: 72.5 fL — ABNORMAL LOW (ref 80.0–100.0)
Monocytes Absolute: 1 10*3/uL (ref 0.1–1.0)
Monocytes Relative: 10 %
Neutro Abs: 6.7 10*3/uL (ref 1.7–7.7)
Neutrophils Relative %: 71 %
Platelets: 140 10*3/uL — ABNORMAL LOW (ref 150–400)
RBC: 4.98 MIL/uL (ref 3.87–5.11)
RDW: 18.7 % — ABNORMAL HIGH (ref 11.5–15.5)
WBC: 9.4 10*3/uL (ref 4.0–10.5)
nRBC: 0 % (ref 0.0–0.2)

## 2024-04-22 LAB — GLUCOSE, CAPILLARY
Glucose-Capillary: 98 mg/dL (ref 70–99)
Glucose-Capillary: 116 mg/dL — ABNORMAL HIGH (ref 70–99)
Glucose-Capillary: 206 mg/dL — ABNORMAL HIGH (ref 70–99)

## 2024-04-22 LAB — BASIC METABOLIC PANEL WITH GFR
Anion gap: 7 (ref 5–15)
BUN: 54 mg/dL — ABNORMAL HIGH (ref 8–23)
CO2: 29 mmol/L (ref 22–32)
Calcium: 9.3 mg/dL (ref 8.9–10.3)
Chloride: 101 mmol/L (ref 98–111)
Creatinine, Ser: 1.56 mg/dL — ABNORMAL HIGH (ref 0.44–1.00)
GFR, Estimated: 33 mL/min — ABNORMAL LOW (ref >60)
Glucose, Bld: 106 mg/dL — ABNORMAL HIGH (ref 70–99)
Potassium: 3.6 mmol/L (ref 3.5–5.1)
Sodium: 137 mmol/L (ref 135–145)

## 2024-04-22 MED ORDER — TORSEMIDE 20 MG PO TABS: 20.0000 mg | ORAL_TABLET | Freq: Every day | ORAL | 0 refills | Status: DC

## 2024-04-22 NOTE — Discharge Summary (Signed)
 Physician Discharge Summary  ZEDA GANGWER YQM:[id number] DOB: 10/20/1941 DOA: 04/17/2024  PCP: Claud Crumb, MD  Admit date: 04/17/2024 Discharge date: 04/22/2024  Admitted From: Home Discharge disposition: Home with home health PT  Recommendations at discharge:  Continue gentle diuresis with torsemide 20 mg daily Follow-up with your pulmonologist and nephrologist as an outpatient   Brief narrative: [name] is a 84 y.o. female with PMH significant for renal transplant status 2012, DM 2, HTN, HLD, CAD/CABG, asthma, GERD, gout, chronically on 6 L oxygen  5/29, patient presented to the ED with complaint of exertional shortness of breath Recently hospitalized 5/3 to 5/6 for similar symptoms attributed to CHF and COPD flareup.  Patient apparently has not felt improvement in her symptoms since then.  In the ED, afebrile, heart rate in 70s, blood pressure elevated to 170s, required 8 L oxygen  by nasal cannula. Labs with normal pH, normal pCO2 and VBG BUN/creatinine 33/1.52, proBNP elevated to over 4000, WBC norma 17 daily l, hemoglobin normal Chest x-ray unremarkable.   Admitted to [facility]  Subjective: Patient was seen and examined this morning.  Sitting up in recliner.   Still remains on 6 L of oxygen . Ambulated on the hallway with PT on 6 L with O2 sat over 90% Pulmonary follow-up appreciated. Recommended to continue torsemide 20 mg daily.  Hospital course: Persistent dyspnea Acute on chronic hypoxic respiratory failure H/o COPD, diastolic CHF Presented with persistent dyspnea for last 4 weeks Imaging without evidence of infiltrate, edema or effusion Echocardiogram from 5/5 with EF 60 to 65%, grade 2 diastolic dysfunction, mildly elevated PASP, moderately dilated atria CT chest 5/31 did not show any acute cardiopulmonary process, showed distended pulmonary trunk suggesting of underlying pulm artery hypertension, showed known emphysema.   V/Q scan negative for pulm  embolism Pulmonary consult appreciated.  Recommended gentle diuresis.  Started on 20 mg of torsemide daily.  Continue same at discharge.  CAD/CABG Continue aspirin , Zetia ,  Given history of CAD, cardiology was consulted.  Per cardiology, symptoms less likely to be of cardiac origin.  Suggested outpatient stress test with Lexiscan  Myoview   Hypertension PTA meds- Imdur  30 mg daily, metoprolol  50 mg twice daily, amlodipine  10 mg daily Currently continued on all as well as torsemide 20 mg daily  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 PTA meds- prednisone  5 mg daily, tacrolimus  Continue to monitor creatinine Recent Labs    03/22/24 1610 03/23/24 0336 03/24/24 1004 03/25/24 0546 04/09/24 1038 04/17/24 1646 04/18/24 0244 04/20/24 0516 04/21/24 0423 04/22/24 0455  BUN 25* 29* 41* 53* 26* 33* 30* 40* 48* 54*  CREATININE 1.60* 1.69* 1.77* 2.14* 1.22* 1.52* 1.38* 1.39* 1.79* 1.56*   Type 2 diabetes mellitus A1c 6.1 on 12/24/2022 PTA meds-none Currently on SSI/Accu-Cheks Recent Labs  Lab 04/21/24 1209 04/21/24 1615 04/21/24 2049 04/22/24 0752 04/22/24 1207  GLUCAP 126* 229* 123* 98 116*   GERD PPI  Pulm nodule CT chest 5/31 showed a 4 mm right middle lobe pulmonary nodule.  To follow-up with PCP as an outpatient further imaging   Impaired mobility  PT eval obtained.  Home with PT recommended  Goals of care   Code Status: Limited: Do not attempt resuscitation (DNR) -DNR-LIMITED -Do Not Intubate/DNI    Diet:  Diet Order             Diet general           Diet heart healthy/carb modified Room service appropriate? Yes; Fluid consistency: Thin  Diet effective  now                   Nutritional status:  Body mass index is 22.14 kg/m.       Wounds:  -    Discharge Exam:   Vitals:   04/21/24 2017 04/21/24 2050 04/22/24 0502 04/22/24 1310  BP:  (!) 148/65 (!) 158/63 133/60  Pulse:  67 97 63  Resp:  20 18 16   Temp:  (!) 97.5 F (36.4 C) 98  F (36.7 C) 98.2 F (36.8 C)  TempSrc:  Oral Oral Oral  SpO2: 92% 97% 100% 100%  Weight:      Height:        Body mass index is 22.14 kg/m.  General exam: Pleasant, elderly African-American female, thin built Skin: No rashes, lesions or ulcers. HEENT: Atraumatic, normocephalic, no obvious bleeding Lungs: clear to auscultation bilaterally, no wheezing, remains on 6 L oxygen  CVS: S1, S2, no murmur,   GI/Abd: Soft, nontender, nondistended, bowel sound present,   CNS: Alert, awake, oriented x 3 Psychiatry: Mood appropriate,  Extremities: No pedal edema, no calf tenderness,   Follow ups:    Follow-up Information     Beam, Conway Dennis, MD Follow up.   Specialty: Family Medicine Contact information: [address] [place] [address] [phone]                 Discharge Instructions:   Discharge Instructions     Call MD for:  difficulty breathing, headache or visual disturbances   Complete by: As directed    Call MD for:  extreme fatigue   Complete by: As directed    Call MD for:  hives   Complete by: As directed    Call MD for:  persistant dizziness or light-headedness   Complete by: As directed    Call MD for:  persistant nausea and vomiting   Complete by: As directed    Call MD for:  severe uncontrolled pain   Complete by: As directed    Call MD for:  temperature >100.4   Complete by: As directed    Diet general   Complete by: As directed    Discharge instructions   Complete by: As directed    Recommendations at discharge:   Continue gentle diuresis with torsemide 20 mg daily  Follow-up with your pulmonologist and nephrologist as an outpatient  General discharge instructions: Follow with Primary MD Beam, Conway Dennis, MD in 7 days  Please request your PCP  to go over your hospital tests, procedures, radiology results at the follow up. Please get your medicines reviewed and adjusted.  Your PCP may decide to repeat certain labs or tests as  needed. Do not drive, operate heavy machinery, perform activities at heights, swimming or participation in water activities or provide baby sitting services if your were admitted for syncope or siezures until you have seen by Primary MD or a Neurologist and advised to do so again. [place]  Controlled Substance Reporting System database was reviewed. Do not drive, operate heavy machinery, perform activities at heights, swim, participate in water activities or provide baby-sitting services while on medications for pain, sleep and mood until your outpatient physician has reevaluated you and advised to do so again.  You are strongly recommended to comply with the dose, frequency and duration of prescribed medications. Activity: As tolerated with Full fall precautions use walker/cane & assistance as needed Avoid using any recreational substances like cigarette, tobacco, alcohol, or non-prescribed drug. If  you experience worsening of your admission symptoms, develop shortness of breath, life threatening emergency, suicidal or homicidal thoughts you must seek medical attention immediately by calling 911 or calling your MD immediately  if symptoms less severe. You must read complete instructions/literature along with all the possible adverse reactions/side effects for all the medicines you take and that have been prescribed to you. Take any new medicine only after you have completely understood and accepted all the possible adverse reactions/side effects.  Wear Seat belts while driving. You were cared for by a hospitalist during your hospital stay. If you have any questions about your discharge medications or the care you received while you were in the hospital after you are discharged, you can call the unit and ask to speak with the hospitalist or the covering physician. Once you are discharged, your primary care physician will handle any further medical issues. Please note that NO REFILLS for any discharge  medications will be authorized once you are discharged, as it is imperative that you return to your primary care physician (or establish a relationship with a primary care physician if you do not have one).   Increase activity slowly   Complete by: As directed        Discharge Medications:   Allergies as of 04/22/2024       Reactions   Sodium Hypochlorite Shortness Of Breath   Lactose Nausea And Vomiting   Asa Arthritis Strength-antacid [aspirin  Buffered] Nausea And Vomiting, Other (See Comments)   STOMACH BURNS, also   Aspirin  Nausea And Vomiting   Per pt. "can tolerate the enteric coated tablets".    Atorvastatin Other (See Comments)   Patient's skin was skin was sensitive   Banana Nausea And Vomiting   Lactose Intolerance (gi) Nausea And Vomiting   Lisinopril Cough        Medication List     STOP taking these medications    doxycycline  100 MG EC tablet Commonly known as: DORYX        TAKE these medications    acetaminophen  650 MG CR tablet Commonly known as: TYLENOL  Take 650 mg by mouth daily as needed for pain.   albuterol  (2.5 MG/3ML) 0.083% nebulizer solution Commonly known as: PROVENTIL  Take 2.5 mg by nebulization every 6 (six) hours as needed for wheezing or shortness of breath.   albuterol  108 (90 Base) MCG/ACT inhaler Commonly known as: VENTOLIN  HFA Inhale 2 puffs into the lungs every 6 (six) hours as needed for wheezing or shortness of breath.   amLODipine  10 MG tablet Commonly known as: NORVASC  Take 10 mg by mouth daily.   aspirin  EC 81 MG tablet Take 81 mg by mouth daily.   Azelastine  HCl 0.15 % Soln Place 1 spray into both nostrils daily as needed for allergies.   budesonide  0.25 MG/2ML nebulizer solution Commonly known as: PULMICORT  Inhale 0.25 mg into the lungs in the morning, at noon, in the evening, and at bedtime.   calcitRIOL  0.25 MCG capsule Commonly known as: ROCALTROL  Take 0.25 mcg by mouth daily.   ezetimibe  10 MG  tablet Commonly known as: ZETIA  TAKE 1 TABLET BY MOUTH EVERY DAY   fluticasone  50 MCG/ACT nasal spray Commonly known as: FLONASE  Place 1 spray into both nostrils in the morning, at noon, and at bedtime.   isosorbide  mononitrate 30 MG 24 hr tablet Commonly known as: IMDUR  Take 1 tablet (30 mg total) by mouth daily.   Klor-Con  M20 20 MEQ tablet Generic drug: potassium chloride  SA TAKE 1 TABLET  BY MOUTH EVERY DAY   metoprolol  tartrate 50 MG tablet Commonly known as: LOPRESSOR  TAKE 1 TABLET BY MOUTH TWICE A DAY   nitroGLYCERIN  0.4 MG SL tablet Commonly known as: NITROSTAT  Place 1 tablet (0.4 mg total) under the tongue every 5 (five) minutes as needed for chest pain.   omeprazole 20 MG capsule Commonly known as: PRILOSEC Take 20 mg by mouth daily after breakfast.   OneTouch Verio test strip Generic drug: glucose blood 1 EACH BY OTHER ROUTE DAILY IN THE AFTERNOON. USE AS INSTRUCTED   predniSONE  5 MG tablet Commonly known as: DELTASONE  Take 5 mg by mouth daily with breakfast. What changed: Another medication with the same name was removed. Continue taking this medication, and follow the directions you see here.   tacrolimus  1 MG capsule Commonly known as: PROGRAF  Take 4 mg by mouth See admin instructions. Take 4 mg by mouth at 9 AM and 4 mg at 9 PM   Tiotropium Bromide  Monohydrate 1.25 MCG/ACT Aers Inhale 1 puff into the lungs in the morning and at bedtime.   torsemide 20 MG tablet Commonly known as: DEMADEX Take 1 tablet (20 mg total) by mouth daily. Start taking on: April 23, 2024   Trelegy Ellipta 200-62.5-25 MCG/ACT Aepb Generic drug: Fluticasone -Umeclidin-Vilant Inhale 1 puff into the lungs in the morning, at noon, and at bedtime.         The results of significant diagnostics from this hospitalization (including imaging, microbiology, ancillary and laboratory) are listed below for reference.    Procedures and Diagnostic Studies:   DG Chest Portable 1  View Result Date: 04/17/2024 CLINICAL DATA:  Shortness of breath EXAM: PORTABLE CHEST 1 VIEW COMPARISON:  04/09/2024 FINDINGS: Prior CABG. Heart and mediastinal contours within normal limits. Aortic atherosclerosis. No confluent airspace opacities or effusions. No acute bony abnormality. IMPRESSION: No active disease. Electronically Signed   By: Janeece Mechanic M.D.   On: 04/17/2024 19:05     Labs:   Basic Metabolic Panel: Recent Labs  Lab 04/17/24 1646 04/17/24 1650 04/18/24 0244 04/20/24 0516 04/21/24 0423 04/22/24 0455  NA 139 137 138 138 137 137  K 4.6 4.5 3.9 4.2 3.9 3.6  CL 103  --  106 107 101 101  CO2 21*  --  24 27 28 29   GLUCOSE 161*  --  163* 110* 111* 106*  BUN 33*  --  30* 40* 48* 54*  CREATININE 1.52*  --  1.38* 1.39* 1.79* 1.56*  CALCIUM  9.8  --  9.2 9.5 9.2 9.3   GFR Estimated Creatinine Clearance: 22.4 mL/min (A) (by C-G formula based on SCr of 1.56 mg/dL (H)). Liver Function Tests: Recent Labs  Lab 04/18/24 0244  AST 19  ALT 19  ALKPHOS 51  BILITOT 0.6  PROT 5.4*  ALBUMIN 3.0*   No results for input(s): "LIPASE", "AMYLASE" in the last 168 hours. No results for input(s): "AMMONIA" in the last 168 hours. Coagulation profile No results for input(s): "INR", "PROTIME" in the last 168 hours.  CBC: Recent Labs  Lab 04/17/24 1646 04/17/24 1650 04/18/24 0244 04/20/24 0516 04/21/24 0423 04/22/24 0455  WBC 7.1  --  6.8 8.3 9.0 9.4  NEUTROABS  --   --  5.0 5.5 6.3 6.7  HGB 12.5 12.9 11.4* 11.7* 12.0 12.1  HCT 36.9 38.0 34.3* 35.6* 36.3 36.1  MCV 71.5*  --  74.1* 73.4* 72.5* 72.5*  PLT 160  --  137* 136* 141* 140*   Cardiac Enzymes: No results for input(s): "CKTOTAL", "  CKMB", "CKMBINDEX", "TROPONINI" in the last 168 hours. BNP: Invalid input(s): "POCBNP" CBG: Recent Labs  Lab 04/21/24 1209 04/21/24 1615 04/21/24 2049 04/22/24 0752 04/22/24 1207  GLUCAP 126* 229* 123* 98 116*   D-Dimer No results for input(s): "DDIMER" in the last 72  hours. Hgb A1c No results for input(s): "HGBA1C" in the last 72 hours. Lipid Profile No results for input(s): "CHOL", "HDL", "LDLCALC", "TRIG", "CHOLHDL", "LDLDIRECT" in the last 72 hours. Thyroid  function studies No results for input(s): "TSH", "T4TOTAL", "T3FREE", "THYROIDAB" in the last 72 hours.  Invalid input(s): "FREET3" Anemia work up No results for input(s): "VITAMINB12", "FOLATE", "FERRITIN", "TIBC", "IRON", "RETICCTPCT" in the last 72 hours. Microbiology Recent Results (from the past 240 hours)  Resp panel by RT-PCR (RSV, Flu A&B, Covid) Anterior Nasal Swab     Status: None   Collection Time: 04/17/24  5:08 PM   Specimen: Anterior Nasal Swab  Result Value Ref Range Status   SARS Coronavirus 2 by RT PCR NEGATIVE NEGATIVE Final    Comment: (NOTE) SARS-CoV-2 target nucleic acids are NOT DETECTED.  The SARS-CoV-2 RNA is generally detectable in upper respiratory specimens during the acute phase of infection. The lowest concentration of SARS-CoV-2 viral copies this assay can detect is 138 copies/mL. A negative result does not preclude SARS-Cov-2 infection and should not be used as the sole basis for treatment or other patient management decisions. A negative result may occur with  improper specimen collection/handling, submission of specimen other than nasopharyngeal swab, presence of viral mutation(s) within the areas targeted by this assay, and inadequate number of viral copies(<138 copies/mL). A negative result must be combined with clinical observations, patient history, and epidemiological information. The expected result is Negative.  Fact Sheet for Patients:  BloggerCourse.com  Fact Sheet for Healthcare Providers:  SeriousBroker.it  This test is no t yet approved or cleared by the [place]  FDA and  has been authorized for detection and/or diagnosis of SARS-CoV-2 by FDA under an Emergency Use Authorization  (EUA). This EUA will remain  in effect (meaning this test can be used) for the duration of the COVID-19 declaration under Section 564(b)(1) of the Act, 21 U.S.C.section 360bbb-3(b)(1), unless the authorization is terminated  or revoked sooner.       Influenza A by PCR NEGATIVE NEGATIVE Final   Influenza B by PCR NEGATIVE NEGATIVE Final    Comment: (NOTE) The Xpert Xpress SARS-CoV-2/FLU/RSV plus assay is intended as an aid in the diagnosis of influenza from Nasopharyngeal swab specimens and should not be used as a sole basis for treatment. Nasal washings and aspirates are unacceptable for Xpert Xpress SARS-CoV-2/FLU/RSV testing.  Fact Sheet for Patients: BloggerCourse.com  Fact Sheet for Healthcare Providers: SeriousBroker.it  This test is not yet approved or cleared by the [place]  FDA and has been authorized for detection and/or diagnosis of SARS-CoV-2 by FDA under an Emergency Use Authorization (EUA). This EUA will remain in effect (meaning this test can be used) for the duration of the COVID-19 declaration under Section 564(b)(1) of the Act, 21 U.S.C. section 360bbb-3(b)(1), unless the authorization is terminated or revoked.     Resp Syncytial Virus by PCR NEGATIVE NEGATIVE Final    Comment: (NOTE) Fact Sheet for Patients: BloggerCourse.com  Fact Sheet for Healthcare Providers: SeriousBroker.it  This test is not yet approved or cleared by the [place]  FDA and has been authorized for detection and/or diagnosis of SARS-CoV-2 by FDA under an Emergency Use Authorization (EUA). This EUA will remain in effect (meaning this  test can be used) for the duration of the COVID-19 declaration under Section 564(b)(1) of the Act, 21 U.S.C. section 360bbb-3(b)(1), unless the authorization is terminated or revoked.  Performed at [facility], [address], [place], [place] [address]     Time coordinating discharge: 45 minutes  Signed: Aminta Baldy Mate Alegria  [facility] 04/22/2024, 1:37 PM

## 2024-04-22 NOTE — Progress Notes (Signed)
 NAME:  [name], MRN:  [id number], DOB:  09/16/1941, LOS: 3 ADMISSION DATE:  04/17/2024, CONSULTATION DATE: 04/21/2024 REFERRING MD: Dr. Gwynneth Lessen, CHIEF COMPLAINT: COPD exacerbation, shortness of breath  History of Present Illness:  Patient admitted with worsening shortness of breath, recently hospitalized for similar symptoms   History of renal transplant in 2012-follows up at [facility], history of diabetes, hypertension, hyperlipidemia, coronary artery disease/CABG, asthma, GERD On oxygen  chronically   Quit smoking over 20 years ago Exposure to formaldehyde, some other irritants during her work history  Pertinent  Medical History   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, hold ASA 2-3 weeks - consider plavix instead of ASA   Asthma    Cellulitis 12/19/2019   right upper arm   Coronary artery disease    cath 11/2007, grafts patent /  Nuclear, June, 2011, prior inferior MI with mild peri-infarct ischemia, anterior breast attenuation, EF 67%, done for renal transplant assessment / Low level exercise/Lexiscan  Myoview  (11/2013): EF 67%, no ischemi; normal study   Diabetes mellitus    type 2   ESRD on dialysis [facility])    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, 2012   Family history of breast cancer    Family history of prostate cancer    GERD (gastroesophageal reflux disease)    GI bleed 11/26/2010   January, 2012 , AVM   Gout    History of methicillin resistant staphylococcus aureus (MRSA)    Hyperlipidemia    Hyperparathyroidism    Hypertension    Myocardial infarction [facility])    Osteoporosis    Pneumonia 08/2010    Hospitalization, October, 2011   Primary osteoarthritis, 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   S/P kidney transplant    September, 2012, [facility]   Subacromial impingement of 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     Significant Hospital Events: Including procedures, antibiotic start and stop dates in addition to other pertinent events   CT  chest reviewed-extensive emphysema Echocardiogram-result reviewed with evidence of stress on the right side of the heart mildly elevated pulmonary artery systolic pressure with moderately dilated atria    Interim History / Subjective:  Feels a little bit better today Less shortness of breath Still coughing with no significant sputum production  Objective    Blood pressure (!) 158/63, pulse 97, temperature 98 F (36.7 C), temperature source Oral, resp. rate 18, height 5\' 2"  (1.575 m), weight 54.9 kg, last menstrual period 03/04/2022, SpO2 100%.        Intake/Output Summary (Last 24 hours) at 04/22/2024 0935 Last data filed at 04/22/2024 0815 Gross per 24 hour  Intake 588 ml  Output 800 ml  Net -212 ml   Filed Weights   04/17/24 2200 04/17/24 2301  Weight: 55.3 kg 54.9 kg    Examination: General: Elderly, does not appear to be in distress HENT: Moist oral mucosa Lungs: Decreased air movement bilaterally Cardiovascular: S1-S2 appreciated Abdomen: Soft, bowel sounds appreciated  I reviewed labs  Resolved problem list   Assessment and Plan   Advanced chronic obstructive pulmonary disease - Continue bronchodilator treatments  Diastolic heart failure  Cor pulmonale Mild pulmonary hypertension - Cautious diuresis - Needs fluid optimization - Ordered low-dose torsemide at 20 mg - BNP over thousand  Continue to optimize management of COPD Does not appear to have an infectious exacerbation at present  History of renal transplant Type 2 diabetes GERD  Has had recurrent visits to the hospital which  is likely related to cor pulmonale with very advanced underlying obstructive lung disease  Since being on low-dose torsemide, to follow-up closely with renal service.

## 2024-04-22 NOTE — Progress Notes (Signed)
 Physical Therapy Treatment Patient Details Name: [name] MRN: [id number] DOB: 12-Jul-1941 Today's Date: 04/22/2024   History of Present Illness Patient is an 83 yo female admitted on with acute on chronic respiratory failure. PMH significant for COPD on 6L home O2, CAD, CHF, asthma, hx of CABG, ESRD s/p transplant in 2012 on immunosuppression, T2DM, and gout.    PT Comments  Pt AxO x 3 pleasant.  In bed on 6 lts sats 97%.  Assisted with amb in hallway.  General Gait Details: limited distance of 75 feet due to dyspnea 2/4 remained on 6 lts oxygen  with avg sats 96% and HR 63.  Pt using walker for stability/balance "I have a bad knee". Pt plans to D/C to home when medically cleared.  LPT has rec HH PT.    If plan is discharge home, recommend the following: A little help with walking and/or transfers;A little help with bathing/dressing/bathroom;Assistance with cooking/housework;Assist for transportation;Help with stairs or ramp for entrance   Can travel by private vehicle        Equipment Recommendations  None recommended by PT    Recommendations for Other Services       Precautions / Restrictions Precautions Precautions: Fall Precaution/Restrictions Comments: Monitor sats, 6L baseline "for years" and "bad" L knee     Mobility  Bed Mobility Overal bed mobility: Modified Independent             General bed mobility comments: Pt self able OOB and back into bed    Transfers Overall transfer level: Needs assistance Equipment used: Rolling walker (2 wheels) Transfers: Sit to/from Stand Sit to Stand: Supervision           General transfer comment: good safety cognition and use of hands to steady self    Ambulation/Gait Ambulation/Gait assistance: Contact guard assist, Supervision Gait Distance (Feet): 75 Feet Assistive device: Rolling walker (2 wheels) Gait Pattern/deviations: Step-through pattern, Decreased stride length Gait velocity: decreased     General  Gait Details: limited distance of 75 feet due to dyspnea 2/4 remained on 6 lts oxygen  with avg sats 96% and HR 63.  Pt using walker for stability/balance "I have a bad knee".   Stairs             Wheelchair Mobility     Tilt Bed    Modified Rankin (Stroke Patients Only)       Balance                                            Communication Communication Communication: No apparent difficulties  Cognition Arousal: Alert Behavior During Therapy: WFL for tasks assessed/performed   PT - Cognitive impairments: No apparent impairments                       PT - Cognition Comments: AxO x 3 pleasant Lady. Following commands: Intact      Cueing Cueing Techniques: Verbal cues  Exercises      General Comments        Pertinent Vitals/Pain Pain Assessment Pain Assessment: No/denies pain    Home Living                          Prior Function            PT Goals (current goals can now be found  in the care plan section) Progress towards PT goals: Progressing toward goals    Frequency    Min 2X/week      PT Plan      Co-evaluation              AM-PAC PT "6 Clicks" Mobility   Outcome Measure  Help needed turning from your back to your side while in a flat bed without using bedrails?: None Help needed moving from lying on your back to sitting on the side of a flat bed without using bedrails?: None Help needed moving to and from a bed to a chair (including a wheelchair)?: None Help needed standing up from a chair using your arms (e.g., wheelchair or bedside chair)?: None Help needed to walk in hospital room?: A Little Help needed climbing 3-5 steps with a railing? : A Little 6 Click Score: 22    End of Session Equipment Utilized During Treatment: Gait belt;Oxygen  Activity Tolerance: Patient tolerated treatment well Patient left: with call bell/phone within reach;in bed Nurse Communication: Mobility status PT  Visit Diagnosis: Difficulty in walking, not elsewhere classified (R26.2)     Time: 1610-9604 PT Time Calculation (min) (ACUTE ONLY): 12 min  Charges:    $Gait Training: 8-22 mins PT General Charges $$ ACUTE PT VISIT: 1 Visit                     Bess Broody  PTA Acute  Rehabilitation Services Office M-F          [phone]

## 2024-04-22 NOTE — TOC Transition Note (Signed)
 Transition of Care [facility]) - Discharge Note   Patient Details  Name: [name] MRN: [id number] Date of Birth: 1941/10/28  [facility]) CM/SW Contact:  Ruben Corolla, RN Phone Number: 04/22/2024, 1:56 PM   Clinical Narrative:  d/c home w/[facility] HH[facility] rep Amy aware. Already has home 02 w/[facility]-has travel tank. Has own transport home. No further CM needs.    Final next level of care: Home w Home Health Services Barriers to Discharge: No Barriers Identified   Patient Goals and CMS Choice Patient states their goals for this hospitalization and ongoing recovery are:: Home CMS Medicare.gov Compare Post Acute Care list provided to:: Patient Choice offered to / list presented to : Patient [place] ownership interest in [facility].provided to:: Patient    Discharge Placement                       Discharge Plan and Services Additional resources added to the After Visit Summary for     Discharge Planning Services: CM Consult Post Acute Care Choice: Home Health                    HH Arranged: PT [facility] Agency: [facility] Date [facility] Agency Contacted: 04/22/24 Time HH Agency Contacted: [id number] Representative spoke with at [facility] Agency: Amy  Social Drivers of Health (SDOH) Interventions SDOH Screenings   Food Insecurity: No Food Insecurity (04/17/2024)  Housing: Low Risk  (04/17/2024)  Transportation Needs: No Transportation Needs (04/17/2024)  Utilities: Not At Risk (04/17/2024)  Depression (PHQ2-9): Low Risk  (11/17/2022)  Financial Resource Strain: Low Risk  (04/02/2024)   Received from [facility]  Physical Activity: Sufficiently Active (08/09/2022)   [facility] from [facility], [facility]  Social Connections: Moderately Integrated (04/17/2024)  Stress: No Stress Concern Present (08/09/2022)   [facility]  Tobacco Use: Medium Risk (04/18/2024)     Readmission Risk Interventions     No data to display

## 2024-05-08 ENCOUNTER — Other Ambulatory Visit: Payer: Self-pay | Admitting: Family Medicine

## 2024-05-08 DIAGNOSIS — Z0000 Encounter for general adult medical examination without abnormal findings: Secondary | ICD-10-CM

## 2024-05-19 ENCOUNTER — Ambulatory Visit
Admission: RE | Admit: 2024-05-19 | Discharge: 2024-05-19 | Disposition: A | Discharge status: Home / Self Care | Source: Ambulatory Visit | Attending: Family Medicine | Admitting: Family Medicine

## 2024-05-19 DIAGNOSIS — Z0000 Encounter for general adult medical examination without abnormal findings: Secondary | ICD-10-CM

## 2024-05-24 ENCOUNTER — Other Ambulatory Visit: Payer: Self-pay

## 2024-05-24 ENCOUNTER — Other Ambulatory Visit (HOSPITAL_COMMUNITY)

## 2024-05-24 ENCOUNTER — Emergency Department (HOSPITAL_COMMUNITY)

## 2024-05-24 ENCOUNTER — Inpatient Hospital Stay (HOSPITAL_COMMUNITY)
Admission: EM | Admit: 2024-05-24 | Discharge: 2024-06-20 | DRG: 871 | Disposition: E | Attending: Internal Medicine | Admitting: Internal Medicine

## 2024-05-24 ENCOUNTER — Observation Stay (HOSPITAL_COMMUNITY)

## 2024-05-24 DIAGNOSIS — I5032 Chronic diastolic (congestive) heart failure: Secondary | ICD-10-CM | POA: Diagnosis not present

## 2024-05-24 DIAGNOSIS — J690 Pneumonitis due to inhalation of food and vomit: Secondary | ICD-10-CM | POA: Diagnosis present

## 2024-05-24 DIAGNOSIS — D849 Immunodeficiency, unspecified: Secondary | ICD-10-CM | POA: Diagnosis present

## 2024-05-24 DIAGNOSIS — Z66 Do not resuscitate: Secondary | ICD-10-CM | POA: Diagnosis present

## 2024-05-24 DIAGNOSIS — N170 Acute kidney failure with tubular necrosis: Secondary | ICD-10-CM | POA: Diagnosis present

## 2024-05-24 DIAGNOSIS — R5381 Other malaise: Secondary | ICD-10-CM | POA: Diagnosis present

## 2024-05-24 DIAGNOSIS — J449 Chronic obstructive pulmonary disease, unspecified: Secondary | ICD-10-CM

## 2024-05-24 DIAGNOSIS — J9621 Acute and chronic respiratory failure with hypoxia: Secondary | ICD-10-CM | POA: Diagnosis present

## 2024-05-24 DIAGNOSIS — E119 Type 2 diabetes mellitus without complications: Secondary | ICD-10-CM | POA: Diagnosis not present

## 2024-05-24 DIAGNOSIS — M810 Age-related osteoporosis without current pathological fracture: Secondary | ICD-10-CM | POA: Diagnosis present

## 2024-05-24 DIAGNOSIS — D696 Thrombocytopenia, unspecified: Secondary | ICD-10-CM | POA: Diagnosis present

## 2024-05-24 DIAGNOSIS — I132 Hypertensive heart and chronic kidney disease with heart failure and with stage 5 chronic kidney disease, or end stage renal disease: Secondary | ICD-10-CM | POA: Diagnosis present

## 2024-05-24 DIAGNOSIS — I5033 Acute on chronic diastolic (congestive) heart failure: Secondary | ICD-10-CM | POA: Diagnosis present

## 2024-05-24 DIAGNOSIS — N1831 Chronic kidney disease, stage 3a: Secondary | ICD-10-CM

## 2024-05-24 DIAGNOSIS — I252 Old myocardial infarction: Secondary | ICD-10-CM

## 2024-05-24 DIAGNOSIS — J189 Pneumonia, unspecified organism: Secondary | ICD-10-CM

## 2024-05-24 DIAGNOSIS — Z96642 Presence of left artificial hip joint: Secondary | ICD-10-CM | POA: Diagnosis present

## 2024-05-24 DIAGNOSIS — X58XXXA Exposure to other specified factors, initial encounter: Secondary | ICD-10-CM | POA: Diagnosis not present

## 2024-05-24 DIAGNOSIS — K219 Gastro-esophageal reflux disease without esophagitis: Secondary | ICD-10-CM | POA: Diagnosis not present

## 2024-05-24 DIAGNOSIS — D62 Acute posthemorrhagic anemia: Secondary | ICD-10-CM | POA: Diagnosis present

## 2024-05-24 DIAGNOSIS — R6520 Severe sepsis without septic shock: Secondary | ICD-10-CM | POA: Diagnosis present

## 2024-05-24 DIAGNOSIS — I469 Cardiac arrest, cause unspecified: Secondary | ICD-10-CM | POA: Diagnosis not present

## 2024-05-24 DIAGNOSIS — J1282 Pneumonia due to coronavirus disease 2019: Secondary | ICD-10-CM | POA: Diagnosis present

## 2024-05-24 DIAGNOSIS — J9601 Acute respiratory failure with hypoxia: Secondary | ICD-10-CM | POA: Diagnosis not present

## 2024-05-24 DIAGNOSIS — N179 Acute kidney failure, unspecified: Secondary | ICD-10-CM | POA: Diagnosis not present

## 2024-05-24 DIAGNOSIS — J440 Chronic obstructive pulmonary disease with acute lower respiratory infection: Secondary | ICD-10-CM | POA: Diagnosis present

## 2024-05-24 DIAGNOSIS — U071 COVID-19: Secondary | ICD-10-CM | POA: Diagnosis present

## 2024-05-24 DIAGNOSIS — Z992 Dependence on renal dialysis: Secondary | ICD-10-CM

## 2024-05-24 DIAGNOSIS — I509 Heart failure, unspecified: Principal | ICD-10-CM

## 2024-05-24 DIAGNOSIS — E11649 Type 2 diabetes mellitus with hypoglycemia without coma: Secondary | ICD-10-CM | POA: Diagnosis not present

## 2024-05-24 DIAGNOSIS — Z87891 Personal history of nicotine dependence: Secondary | ICD-10-CM

## 2024-05-24 DIAGNOSIS — E739 Lactose intolerance, unspecified: Secondary | ICD-10-CM | POA: Diagnosis present

## 2024-05-24 DIAGNOSIS — Z888 Allergy status to other drugs, medicaments and biological substances status: Secondary | ICD-10-CM

## 2024-05-24 DIAGNOSIS — J9611 Chronic respiratory failure with hypoxia: Secondary | ICD-10-CM | POA: Insufficient documentation

## 2024-05-24 DIAGNOSIS — A4189 Other specified sepsis: Secondary | ICD-10-CM | POA: Diagnosis present

## 2024-05-24 DIAGNOSIS — R609 Edema, unspecified: Secondary | ICD-10-CM | POA: Diagnosis not present

## 2024-05-24 DIAGNOSIS — K921 Melena: Secondary | ICD-10-CM | POA: Diagnosis present

## 2024-05-24 DIAGNOSIS — M19012 Primary osteoarthritis, left shoulder: Secondary | ICD-10-CM | POA: Diagnosis present

## 2024-05-24 DIAGNOSIS — R0902 Hypoxemia: Secondary | ICD-10-CM

## 2024-05-24 DIAGNOSIS — T8619 Other complication of kidney transplant: Secondary | ICD-10-CM | POA: Diagnosis present

## 2024-05-24 DIAGNOSIS — E8729 Other acidosis: Secondary | ICD-10-CM

## 2024-05-24 DIAGNOSIS — Z515 Encounter for palliative care: Secondary | ICD-10-CM

## 2024-05-24 DIAGNOSIS — R262 Difficulty in walking, not elsewhere classified: Secondary | ICD-10-CM | POA: Diagnosis present

## 2024-05-24 DIAGNOSIS — J441 Chronic obstructive pulmonary disease with (acute) exacerbation: Secondary | ICD-10-CM

## 2024-05-24 DIAGNOSIS — Y830 Surgical operation with transplant of whole organ as the cause of abnormal reaction of the patient, or of later complication, without mention of misadventure at the time of the procedure: Secondary | ICD-10-CM | POA: Diagnosis present

## 2024-05-24 DIAGNOSIS — Z7951 Long term (current) use of inhaled steroids: Secondary | ICD-10-CM

## 2024-05-24 DIAGNOSIS — Z951 Presence of aortocoronary bypass graft: Secondary | ICD-10-CM

## 2024-05-24 DIAGNOSIS — R197 Diarrhea, unspecified: Secondary | ICD-10-CM | POA: Diagnosis present

## 2024-05-24 DIAGNOSIS — E875 Hyperkalemia: Secondary | ICD-10-CM | POA: Diagnosis present

## 2024-05-24 DIAGNOSIS — R0609 Other forms of dyspnea: Secondary | ICD-10-CM | POA: Diagnosis not present

## 2024-05-24 DIAGNOSIS — R5383 Other fatigue: Secondary | ICD-10-CM | POA: Diagnosis present

## 2024-05-24 DIAGNOSIS — N183 Chronic kidney disease, stage 3 unspecified: Secondary | ICD-10-CM | POA: Diagnosis not present

## 2024-05-24 DIAGNOSIS — D649 Anemia, unspecified: Secondary | ICD-10-CM | POA: Diagnosis not present

## 2024-05-24 DIAGNOSIS — S7012XA Contusion of left thigh, initial encounter: Secondary | ICD-10-CM

## 2024-05-24 DIAGNOSIS — Z886 Allergy status to analgesic agent status: Secondary | ICD-10-CM

## 2024-05-24 DIAGNOSIS — I2721 Secondary pulmonary arterial hypertension: Secondary | ICD-10-CM | POA: Diagnosis present

## 2024-05-24 DIAGNOSIS — R0602 Shortness of breath: Secondary | ICD-10-CM | POA: Diagnosis present

## 2024-05-24 DIAGNOSIS — I2510 Atherosclerotic heart disease of native coronary artery without angina pectoris: Secondary | ICD-10-CM | POA: Diagnosis present

## 2024-05-24 DIAGNOSIS — N186 End stage renal disease: Secondary | ICD-10-CM | POA: Diagnosis present

## 2024-05-24 DIAGNOSIS — Z9981 Dependence on supplemental oxygen: Secondary | ICD-10-CM

## 2024-05-24 DIAGNOSIS — E1122 Type 2 diabetes mellitus with diabetic chronic kidney disease: Secondary | ICD-10-CM | POA: Diagnosis present

## 2024-05-24 DIAGNOSIS — Z91018 Allergy to other foods: Secondary | ICD-10-CM

## 2024-05-24 DIAGNOSIS — E872 Acidosis, unspecified: Secondary | ICD-10-CM | POA: Diagnosis present

## 2024-05-24 DIAGNOSIS — Z8249 Family history of ischemic heart disease and other diseases of the circulatory system: Secondary | ICD-10-CM

## 2024-05-24 DIAGNOSIS — I10 Essential (primary) hypertension: Secondary | ICD-10-CM | POA: Diagnosis not present

## 2024-05-24 DIAGNOSIS — Z860101 Personal history of adenomatous and serrated colon polyps: Secondary | ICD-10-CM

## 2024-05-24 DIAGNOSIS — T148XXA Other injury of unspecified body region, initial encounter: Secondary | ICD-10-CM | POA: Insufficient documentation

## 2024-05-24 DIAGNOSIS — Z79899 Other long term (current) drug therapy: Secondary | ICD-10-CM

## 2024-05-24 DIAGNOSIS — Z8619 Personal history of other infectious and parasitic diseases: Secondary | ICD-10-CM

## 2024-05-24 DIAGNOSIS — B029 Zoster without complications: Secondary | ICD-10-CM | POA: Diagnosis present

## 2024-05-24 DIAGNOSIS — Z7982 Long term (current) use of aspirin: Secondary | ICD-10-CM

## 2024-05-24 DIAGNOSIS — J4489 Other specified chronic obstructive pulmonary disease: Secondary | ICD-10-CM | POA: Diagnosis present

## 2024-05-24 DIAGNOSIS — Z7189 Other specified counseling: Secondary | ICD-10-CM | POA: Diagnosis not present

## 2024-05-24 DIAGNOSIS — Z90710 Acquired absence of both cervix and uterus: Secondary | ICD-10-CM

## 2024-05-24 DIAGNOSIS — E860 Dehydration: Secondary | ICD-10-CM | POA: Diagnosis present

## 2024-05-24 DIAGNOSIS — E785 Hyperlipidemia, unspecified: Secondary | ICD-10-CM | POA: Diagnosis present

## 2024-05-24 DIAGNOSIS — Z8614 Personal history of Methicillin resistant Staphylococcus aureus infection: Secondary | ICD-10-CM

## 2024-05-24 LAB — RENAL FUNCTION PANEL
Albumin: 2.6 g/dL — ABNORMAL LOW (ref 3.5–5.0)
Anion gap: 20 — ABNORMAL HIGH (ref 5–15)
BUN: 46 mg/dL — ABNORMAL HIGH (ref 8–23)
CO2: 16 mmol/L — ABNORMAL LOW (ref 22–32)
Calcium: 8.9 mg/dL (ref 8.9–10.3)
Chloride: 102 mmol/L (ref 98–111)
Creatinine, Ser: 2.94 mg/dL — ABNORMAL HIGH (ref 0.44–1.00)
GFR, Estimated: 16 mL/min — ABNORMAL LOW (ref >60)
Glucose, Bld: 327 mg/dL — ABNORMAL HIGH (ref 70–99)
Phosphorus: 4.3 mg/dL (ref 2.5–4.6)
Potassium: 4.9 mmol/L (ref 3.5–5.1)
Sodium: 138 mmol/L (ref 135–145)

## 2024-05-24 LAB — I-STAT VENOUS BLOOD GAS, ED
Acid-Base Excess: 0 mmol/L (ref 0.0–2.0)
Bicarbonate: 25.2 mmol/L (ref 20.0–28.0)
Calcium, Ion: 1.03 mmol/L — ABNORMAL LOW (ref 1.15–1.40)
HCT: 49 % — ABNORMAL HIGH (ref 36.0–46.0)
Hemoglobin: 16.7 g/dL — ABNORMAL HIGH (ref 12.0–15.0)
O2 Saturation: 100 %
Potassium: 6.9 mmol/L (ref 3.5–5.1)
Sodium: 133 mmol/L — ABNORMAL LOW (ref 135–145)
TCO2: 26 mmol/L (ref 22–32)
pCO2, Ven: 40.7 mmHg — ABNORMAL LOW (ref 44–60)
pH, Ven: 7.401 (ref 7.25–7.43)
pO2, Ven: 180 mmHg — ABNORMAL HIGH (ref 32–45)

## 2024-05-24 LAB — CBC WITH DIFFERENTIAL/PLATELET
Abs Immature Granulocytes: 0.12 K/uL — ABNORMAL HIGH (ref 0.00–0.07)
Basophils Absolute: 0 K/uL (ref 0.0–0.1)
Basophils Relative: 0 %
Eosinophils Absolute: 0.1 K/uL (ref 0.0–0.5)
Eosinophils Relative: 0 %
HCT: 33.2 % — ABNORMAL LOW (ref 36.0–46.0)
Hemoglobin: 11.9 g/dL — ABNORMAL LOW (ref 12.0–15.0)
Immature Granulocytes: 1 %
Lymphocytes Relative: 14 %
Lymphs Abs: 1.7 K/uL (ref 0.7–4.0)
MCH: 26.4 pg (ref 26.0–34.0)
MCHC: 35.8 g/dL (ref 30.0–36.0)
MCV: 73.8 fL — ABNORMAL LOW (ref 80.0–100.0)
Monocytes Absolute: 0.6 K/uL (ref 0.1–1.0)
Monocytes Relative: 5 %
Neutro Abs: 9.7 K/uL — ABNORMAL HIGH (ref 1.7–7.7)
Neutrophils Relative %: 80 %
Platelets: 131 K/uL — ABNORMAL LOW (ref 150–400)
RBC: 4.5 MIL/uL (ref 3.87–5.11)
RDW: 19.8 % — ABNORMAL HIGH (ref 11.5–15.5)
Smear Review: NORMAL
WBC: 12.3 K/uL — ABNORMAL HIGH (ref 4.0–10.5)
nRBC: 0 % (ref 0.0–0.2)

## 2024-05-24 LAB — MAGNESIUM: Magnesium: 2.7 mg/dL — ABNORMAL HIGH (ref 1.7–2.4)

## 2024-05-24 LAB — HEPATIC FUNCTION PANEL
ALT: 13 U/L (ref 0–44)
AST: 31 U/L (ref 15–41)
Albumin: 2.6 g/dL — ABNORMAL LOW (ref 3.5–5.0)
Alkaline Phosphatase: 47 U/L (ref 38–126)
Bilirubin, Direct: 0.2 mg/dL (ref 0.0–0.2)
Indirect Bilirubin: 0.5 mg/dL (ref 0.3–0.9)
Total Bilirubin: 0.7 mg/dL (ref 0.0–1.2)
Total Protein: 6 g/dL — ABNORMAL LOW (ref 6.5–8.1)

## 2024-05-24 LAB — URINALYSIS, COMPLETE (UACMP) WITH MICROSCOPIC
Bacteria, UA: NONE SEEN
Bilirubin Urine: NEGATIVE
Glucose, UA: NEGATIVE mg/dL
Hgb urine dipstick: NEGATIVE
Ketones, ur: NEGATIVE mg/dL
Leukocytes,Ua: NEGATIVE
Nitrite: NEGATIVE
Protein, ur: 100 mg/dL — AB
Specific Gravity, Urine: 1.017 (ref 1.005–1.030)
pH: 5 (ref 5.0–8.0)

## 2024-05-24 LAB — RESP PANEL BY RT-PCR (RSV, FLU A&B, COVID)  RVPGX2
Influenza A by PCR: NEGATIVE
Influenza B by PCR: NEGATIVE
Resp Syncytial Virus by PCR: NEGATIVE
SARS Coronavirus 2 by RT PCR: POSITIVE — AB

## 2024-05-24 LAB — BRAIN NATRIURETIC PEPTIDE: B Natriuretic Peptide: 1819.1 pg/mL — ABNORMAL HIGH (ref 0.0–100.0)

## 2024-05-24 LAB — BLOOD GAS, VENOUS
Acid-base deficit: 7.9 mmol/L — ABNORMAL HIGH (ref 0.0–2.0)
Bicarbonate: 18.3 mmol/L — ABNORMAL LOW (ref 20.0–28.0)
O2 Saturation: 78.2 %
Patient temperature: 37
pCO2, Ven: 39 mmHg — ABNORMAL LOW (ref 44–60)
pH, Ven: 7.28 (ref 7.25–7.43)
pO2, Ven: 49 mmHg — ABNORMAL HIGH (ref 32–45)

## 2024-05-24 LAB — BASIC METABOLIC PANEL WITH GFR
Anion gap: 14 (ref 5–15)
BUN: 44 mg/dL — ABNORMAL HIGH (ref 8–23)
CO2: 22 mmol/L (ref 22–32)
Calcium: 8.7 mg/dL — ABNORMAL LOW (ref 8.9–10.3)
Chloride: 102 mmol/L (ref 98–111)
Creatinine, Ser: 2.61 mg/dL — ABNORMAL HIGH (ref 0.44–1.00)
GFR, Estimated: 18 mL/min — ABNORMAL LOW (ref >60)
Glucose, Bld: 213 mg/dL — ABNORMAL HIGH (ref 70–99)
Potassium: 4.8 mmol/L (ref 3.5–5.1)
Sodium: 138 mmol/L (ref 135–145)

## 2024-05-24 LAB — I-STAT CG4 LACTIC ACID, ED: Lactic Acid, Venous: 8 mmol/L (ref 0.5–1.9)

## 2024-05-24 LAB — CBG MONITORING, ED: Glucose-Capillary: 379 mg/dL — ABNORMAL HIGH (ref 70–99)

## 2024-05-24 LAB — PROTEIN / CREATININE RATIO, URINE
Creatinine, Urine: 220 mg/dL
Protein Creatinine Ratio: 0.34 mg/mg{creat} — ABNORMAL HIGH (ref 0.00–0.15)
Total Protein, Urine: 74 mg/dL

## 2024-05-24 LAB — CREATININE, URINE, RANDOM: Creatinine, Urine: 217 mg/dL

## 2024-05-24 MED ORDER — HEPARIN SODIUM (PORCINE) 5000 UNIT/ML IJ SOLN
5000.0000 [IU] | Freq: Three times a day (TID) | INTRAMUSCULAR | Status: DC
  Administered 2024-05-24 – 2024-06-05 (×35): 5000 [IU] via SUBCUTANEOUS
  Filled 2024-05-24 (×34): qty 1

## 2024-05-24 MED ORDER — DEXAMETHASONE SODIUM PHOSPHATE 10 MG/ML IJ SOLN
6.0000 mg | INTRAMUSCULAR | Status: AC
  Administered 2024-05-24 – 2024-06-01 (×9): 6 mg via INTRAVENOUS
  Filled 2024-05-24 (×2): qty 1
  Filled 2024-05-24: qty 0.6
  Filled 2024-05-24 (×6): qty 1

## 2024-05-24 MED ORDER — SODIUM CHLORIDE 0.9% FLUSH
3.0000 mL | Freq: Two times a day (BID) | INTRAVENOUS | Status: DC
  Administered 2024-05-25 – 2024-06-10 (×31): 3 mL via INTRAVENOUS

## 2024-05-24 MED ORDER — SODIUM CHLORIDE 0.9 % IV SOLN: INTRAVENOUS | Status: AC

## 2024-05-24 MED ORDER — SODIUM CHLORIDE 0.9 % IV SOLN
100.0000 mg | Freq: Every day | INTRAVENOUS | Status: DC
  Administered 2024-05-25: 100 mg via INTRAVENOUS
  Filled 2024-05-24 (×3): qty 20

## 2024-05-24 MED ORDER — LINEZOLID 600 MG/300ML IV SOLN
600.0000 mg | Freq: Two times a day (BID) | INTRAVENOUS | Status: DC
  Administered 2024-05-24 – 2024-05-26 (×4): 600 mg via INTRAVENOUS
  Filled 2024-05-24 (×4): qty 300

## 2024-05-24 MED ORDER — ALBUTEROL (5 MG/ML) CONTINUOUS INHALATION SOLN
10.0000 mg/h | INHALATION_SOLUTION | RESPIRATORY_TRACT | Status: DC
  Filled 2024-05-24: qty 20

## 2024-05-24 MED ORDER — EZETIMIBE 10 MG PO TABS
10.0000 mg | ORAL_TABLET | Freq: Every day | ORAL | Status: DC
  Administered 2024-05-25 – 2024-06-09 (×16): 10 mg via ORAL
  Filled 2024-05-24 (×16): qty 1

## 2024-05-24 MED ORDER — SODIUM CHLORIDE 0.9 % IV SOLN
1.0000 g | Freq: Once | INTRAVENOUS | Status: AC
  Administered 2024-05-24: 1 g via INTRAVENOUS
  Filled 2024-05-24: qty 10

## 2024-05-24 MED ORDER — SODIUM CHLORIDE 0.9 % IV SOLN
200.0000 mg | Freq: Once | INTRAVENOUS | Status: AC
  Administered 2024-05-25: 200 mg via INTRAVENOUS
  Filled 2024-05-24: qty 40

## 2024-05-24 MED ORDER — REVEFENACIN 175 MCG/3ML IN SOLN
175.0000 ug | Freq: Every day | RESPIRATORY_TRACT | Status: DC
  Administered 2024-05-24 – 2024-06-09 (×17): 175 ug via RESPIRATORY_TRACT
  Filled 2024-05-24 (×17): qty 3

## 2024-05-24 MED ORDER — PREDNISONE 5 MG PO TABS: 5.0000 mg | ORAL_TABLET | Freq: Every day | ORAL | Status: DC

## 2024-05-24 MED ORDER — CHLORHEXIDINE GLUCONATE CLOTH 2 % EX PADS
6.0000 | MEDICATED_PAD | Freq: Every day | CUTANEOUS | Status: DC
  Administered 2024-05-27 – 2024-06-09 (×13): 6 via TOPICAL

## 2024-05-24 MED ORDER — SODIUM CHLORIDE 0.9 % IV SOLN
100.0000 mg | Freq: Once | INTRAVENOUS | Status: DC
  Administered 2024-05-24: 100 mg via INTRAVENOUS
  Filled 2024-05-24: qty 100

## 2024-05-24 MED ORDER — ALBUTEROL SULFATE (2.5 MG/3ML) 0.083% IN NEBU
10.0000 mg | INHALATION_SOLUTION | Freq: Once | RESPIRATORY_TRACT | Status: AC
  Administered 2024-05-24: 10 mg via RESPIRATORY_TRACT
  Filled 2024-05-24: qty 12

## 2024-05-24 MED ORDER — FUROSEMIDE 10 MG/ML IJ SOLN
80.0000 mg | Freq: Once | INTRAMUSCULAR | Status: AC
  Administered 2024-05-24: 80 mg via INTRAVENOUS
  Filled 2024-05-24: qty 8

## 2024-05-24 MED ORDER — GUAIFENESIN ER 600 MG PO TB12
1200.0000 mg | ORAL_TABLET | Freq: Two times a day (BID) | ORAL | Status: DC
  Administered 2024-05-25 – 2024-06-09 (×30): 1200 mg via ORAL
  Filled 2024-05-24 (×32): qty 2

## 2024-05-24 MED ORDER — INSULIN ASPART 100 UNIT/ML IJ SOLN: 2.0000 [IU] | INTRAMUSCULAR | Status: DC

## 2024-05-24 MED ORDER — BUDESONIDE 0.25 MG/2ML IN SUSP
0.2500 mg | Freq: Two times a day (BID) | RESPIRATORY_TRACT | Status: DC
  Administered 2024-05-24: 0.25 mg via RESPIRATORY_TRACT
  Filled 2024-05-24: qty 2

## 2024-05-24 MED ORDER — POLYETHYLENE GLYCOL 3350 17 G PO PACK: 17.0000 g | PACK | Freq: Every day | ORAL | Status: DC | PRN

## 2024-05-24 MED ORDER — DOCUSATE SODIUM 100 MG PO CAPS
100.0000 mg | ORAL_CAPSULE | Freq: Two times a day (BID) | ORAL | Status: DC | PRN
Start: 2024-05-24 — End: 2024-06-09

## 2024-05-24 MED ORDER — PIPERACILLIN-TAZOBACTAM 3.375 G IVPB
3.3750 g | Freq: Three times a day (TID) | INTRAVENOUS | Status: DC
  Administered 2024-05-24 – 2024-05-25 (×3): 3.375 g via INTRAVENOUS
  Filled 2024-05-24 (×3): qty 50

## 2024-05-24 MED ORDER — ARFORMOTEROL TARTRATE 15 MCG/2ML IN NEBU
15.0000 ug | INHALATION_SOLUTION | Freq: Two times a day (BID) | RESPIRATORY_TRACT | Status: DC
  Administered 2024-05-24 – 2024-06-09 (×32): 15 ug via RESPIRATORY_TRACT
  Filled 2024-05-24 (×31): qty 2

## 2024-05-24 MED ORDER — FUROSEMIDE 10 MG/ML IJ SOLN
40.0000 mg | Freq: Once | INTRAMUSCULAR | Status: AC
  Administered 2024-05-24: 40 mg via INTRAVENOUS
  Filled 2024-05-24: qty 4

## 2024-05-24 MED ORDER — FAMOTIDINE 20 MG PO TABS
20.0000 mg | ORAL_TABLET | Freq: Every day | ORAL | Status: DC
  Administered 2024-05-25 – 2024-06-07 (×14): 20 mg via ORAL
  Filled 2024-05-24 (×15): qty 1

## 2024-05-24 NOTE — Consult Note (Addendum)
 [facility] Nephrology Consultation Note  Requesting MD: Dr. Francesco Fallow Reason for consult: AKI on CKD.  HPI:  [name] is a 83 y.o. female with past medical history significant for hypertension, HLD, ESRD status post DCD kidney donor in 2012, COPD on home oxygen , CAD status post CABG, recent multiple hospitalization for shortness of breath/COPD presented now with worsening shortness of breath and hypoxia, seen as a consultation for the evaluation and management of AKI on CKD and transplant patient. The patient was brought in by EMS for worsening breathing for about 5 days.  Reportedly oxygen  saturation was around 55% on 4 L, treated with a DuoNeb with improvement of saturation to 83.  En route the patient was given Solu-Medrol , blood pressure was 93/56 with respiratory distress. In the ER, patient was afebrile, blood pressure 99/58, required BiPAP for respiratory distress.  The labs showed potassium 4.8, BUN 44, creatinine 2.61, BNP 1819, WBC 12.3, hemoglobin 11.9.  Repeat lab showed hyperkalemia which might be at her therefore repeating lab.  The chest x-ray consistent with new patchy airspace opacity in both lungs suspicious for edema superimposed on underlying COPD or multifocal pneumonia. In the ER patient has received IV Lasix  and being treated with ceftriaxone , linezolid  and Zosyn  for the treatment of pneumonia.  For CKD, patient follows with Dr. Elfida at [facility].  The baseline serum creatinine level fluctuating anywhere between 1.2-1.7.  The patient is on tacrolimus  4 mg twice daily and prednisone  5 mg.  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  She is currently on BiPAP, receiving IV antibiotics.  Her long-term friend at the bedside.  Reportedly both the patient and her friend both used to work at [employer] before retirement.   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, hold ASA 2-3 weeks - consider plavix instead of ASA   Asthma    Cellulitis 12/19/2019   right upper arm   Coronary artery disease    cath 11/2007, grafts patent /  Nuclear, June, 2011, prior inferior MI with mild peri-infarct ischemia, anterior breast attenuation, EF 67%, done for renal transplant assessment / Low level exercise/Lexiscan  Myoview  (11/2013): EF 67%, no ischemi; normal study   Diabetes mellitus    type 2   ESRD on dialysis [facility])    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, 2012   Family history of breast cancer    Family history of prostate cancer    GERD (gastroesophageal reflux disease)    GI bleed 11/26/2010   January, 2012 , AVM   Gout    History of methicillin resistant staphylococcus aureus (MRSA)    Hyperlipidemia    Hyperparathyroidism    Hypertension    Myocardial infarction [facility])    Osteoporosis    Pneumonia 08/2010    Hospitalization, October, 2011   Primary osteoarthritis, 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   S/P kidney transplant    September, 2012, [facility]   Subacromial impingement of 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    Past Surgical History:  Procedure Laterality Date   ABDOMINAL HYSTERECTOMY  1980'S   ARTERIOVENOUS GRAFT PLACEMENT Left 03/04/2007   forearm   BIOPSY  09/30/2019   Procedure: BIOPSY;  Surgeon: Dianna Specking, MD;  Location: [facility];  Service: Endoscopy;;   BIOPSY  12/27/2021   Procedure: BIOPSY;  Surgeon: Dianna Specking, MD;  Location: [facility];  Service: Endoscopy;;  BREAST EXCISIONAL BIOPSY Right    benign more than 10 yr ago   BREAST SURGERY  YRS AGO   RT BREAST CYST REMOVED    CARDIAC CATHETERIZATION  06/03/2002; 12/04/2007   COLONOSCOPY WITH PROPOFOL  N/A 07/14/2014   Procedure: COLONOSCOPY WITH PROPOFOL ;  Surgeon: Jerrell KYM Sol, MD;  Location: [facility];  Service: Endoscopy;  Laterality: N/A;   COLONOSCOPY WITH PROPOFOL  N/A 09/30/2019   Procedure: COLONOSCOPY WITH PROPOFOL ;  Surgeon: Sol Jerrell, MD;  Location: [facility];  Service: Endoscopy;  Laterality: N/A;   COLONOSCOPY WITH PROPOFOL  N/A 12/27/2021   Procedure: COLONOSCOPY WITH PROPOFOL ;  Surgeon: Sol Jerrell, MD;  [facility]: [facility];  Service: Endoscopy;  Laterality: N/A;   CORONARY ARTERY BYPASS GRAFT  06/06/2002   x 4   ESOPHAGOGASTRODUODENOSCOPY N/A 12/27/2021   Procedure: ESOPHAGOGASTRODUODENOSCOPY (EGD);  Surgeon: Sol Jerrell, MD;  Location: [facility];  Service: Endoscopy;  Laterality: N/A;   HEMOSTASIS CLIP PLACEMENT  12/27/2021   Procedure: HEMOSTASIS CLIP PLACEMENT;  Surgeon: Sol Jerrell, MD;  [facility]: [facility];  Service: Endoscopy;;   HOT HEMOSTASIS N/A 07/14/2014   Procedure: HOT HEMOSTASIS (ARGON PLASMA COAGULATION/BICAP);  Surgeon: Jerrell KYM Sol, MD;  Location: [facility];  Service: Endoscopy;  Laterality: N/A;   IR FLUORO GUIDE CV LINE RIGHT  12/22/2019   IR US  GUIDE VASC ACCESS RIGHT  12/22/2019   KIDNEY TRANSPLANT Right 08/04/2011   ORIF PATELLA Left 04/06/2016   Procedure: OPEN REDUCTION INTERNAL (ORIF) LEFT  PATELLA;  Surgeon: Toribio JULIANNA Chancy, MD;  Location: [facility];  Service: Orthopedics;  Laterality: Left;   POLYPECTOMY  09/30/2019   Procedure: POLYPECTOMY;  Surgeon: Sol Jerrell, MD;  [facility];  Service: Endoscopy;;   POLYPECTOMY  12/27/2021   Procedure: POLYPECTOMY;  Surgeon: Sol Jerrell, MD;  [facility]: [facility];  Service: Endoscopy;;   SHOULDER ARTHROSCOPY WITH ROTATOR CUFF REPAIR AND SUBACROMIAL DECOMPRESSION Left 08/03/2016   Procedure: LEFT SHOULDER ARTHROSCOPY WITH ROTATOR CUFF REPAIR AND SUBACROMIAL DECOMPRESSION with distal claviculectomy and extentsive debridement;  Surgeon: Toribio JULIANNA Chancy, MD;  Location: [facility];  Service: Orthopedics;  Laterality: Left;  Block   THROMBECTOMY AND REVISION OF ARTERIOVENTOUS (AV) GORETEX  GRAFT Left 05/08/2007   forearm   TOTAL HIP ARTHROPLASTY  12/18/2012   Procedure:  TOTAL HIP ARTHROPLASTY;  Surgeon: Toribio JULIANNA Chancy, MD;  Location: [facility] OR;  Service: Orthopedics;  Laterality: Left;   VESICOVAGINAL FISTULA CLOSURE W/ TAH  1984    Family Hx:  Family History  Problem Relation Age of Onset   Prostate cancer Brother    Lung cancer Brother    Cancer Neg Hx     Social History:  reports that she quit smoking about 40 years ago. Her smoking use included cigarettes. She started smoking about 48 years ago. She has a 8 pack-year smoking history. She has never used smokeless tobacco. She reports that she does not drink alcohol and does not use drugs.  Allergies:  Allergies  Allergen Reactions   Sodium Hypochlorite Shortness Of Breath   Lactose Nausea And Vomiting   Asa Arthritis Strength-Antacid [Aspirin  Buffered] Nausea And Vomiting and Other (See Comments)    STOMACH BURNS, also   Aspirin  Nausea And Vomiting    Per pt. can tolerate the enteric coated tablets.    Atorvastatin Other (See Comments)    Patient's skin was skin was sensitive   Banana Nausea And Vomiting   Lactose Intolerance (Gi) Nausea And Vomiting   Lisinopril Cough  Medications: Prior to Admission medications   Medication Sig Start Date End Date Taking? Authorizing Provider  acetaminophen  (TYLENOL ) 650 MG CR tablet Take 650 mg by mouth daily as needed for pain.    [provider]  albuterol  (PROVENTIL ) (2.5 MG/3ML) 0.083% nebulizer solution Take 2.5 mg by nebulization every 6 (six) hours as needed for wheezing or shortness of breath.    [provider]  albuterol  (VENTOLIN  HFA) 108 (90 Base) MCG/ACT inhaler Inhale 2 puffs into the lungs every 6 (six) hours as needed for wheezing or shortness of breath.    [provider]  amLODipine  (NORVASC ) 10 MG tablet Take 10 mg by mouth daily.     [provider]  aspirin  EC 81 MG tablet Take 81 mg by mouth daily.    [provider]  Azelastine  HCl 0.15 % SOLN Place 1 spray into both nostrils daily as  needed for allergies.    [provider]  budesonide  (PULMICORT ) 0.25 MG/2ML nebulizer solution Inhale 0.25 mg into the lungs in the morning, at noon, in the evening, and at bedtime. 02/11/20   [provider]  calcitRIOL  (ROCALTROL ) 0.25 MCG capsule Take 0.25 mcg by mouth daily.     [provider]  ezetimibe  (ZETIA ) 10 MG tablet TAKE 1 TABLET BY MOUTH EVERY DAY 01/16/24   Nahser, Aleene PARAS, MD  fluticasone  (FLONASE ) 50 MCG/ACT nasal spray Place 1 spray into both nostrils in the morning, at noon, and at bedtime. 05/30/21   [provider]  Fluticasone -Umeclidin-Vilant (TRELEGY ELLIPTA) 200-62.5-25 MCG/ACT AEPB Inhale 1 puff into the lungs in the morning, at noon, and at bedtime.    [provider]  isosorbide  mononitrate (IMDUR ) 30 MG 24 hr tablet Take 1 tablet (30 mg total) by mouth daily. 04/15/24   Nahser, Aleene PARAS, MD  KLOR-CON  M20 20 MEQ tablet TAKE 1 TABLET BY MOUTH EVERY DAY 11/22/23   Nahser, Aleene PARAS, MD  metoprolol  tartrate (LOPRESSOR ) 50 MG tablet TAKE 1 TABLET BY MOUTH TWICE A DAY 07/24/23   Nahser, Aleene PARAS, MD  nitroGLYCERIN  (NITROSTAT ) 0.4 MG SL tablet Place 1 tablet (0.4 mg total) under the tongue every 5 (five) minutes as needed for chest pain. 10/03/22   Nahser, Aleene PARAS, MD  omeprazole (PRILOSEC) 20 MG capsule Take 20 mg by mouth daily after breakfast.    [provider]  ONETOUCH VERIO test strip 1 EACH BY OTHER ROUTE DAILY IN THE AFTERNOON. USE AS INSTRUCTED 07/02/23   Shamleffer, Donell Cardinal, MD  predniSONE  (DELTASONE ) 5 MG tablet Take 5 mg by mouth daily with breakfast.    [provider]  tacrolimus  (PROGRAF ) 1 MG capsule Take 4 mg by mouth See admin instructions. Take 4 mg by mouth at 9 AM and 4 mg at 9 PM    [provider]  Tiotropium Bromide  Monohydrate 1.25 MCG/ACT AERS Inhale 1 puff into the lungs in the morning and at bedtime. 10/23/17   [provider]  torsemide  (DEMADEX ) 20 MG tablet Take 1  tablet (20 mg total) by mouth daily. 04/23/24 07/22/24  Arlice Reichert, MD    I have reviewed the patient's current medications.  Labs: Renal Panel: Recent Labs  Lab 05/24/24 1436 05/24/24 1453  NA 138 133*  K 4.8 6.9*  CL 102  --   CO2 22  --   GLUCOSE 213*  --   BUN 44*  --   CREATININE 2.61*  --   CALCIUM  8.7*  --  CBC:    Latest Ref Rng & Units 05/24/2024    2:53 PM 05/24/2024    1:31 PM 04/22/2024    4:55 AM  CBC  WBC 4.0 - 10.5 K/uL  12.3  9.4   Hemoglobin 12.0 - 15.0 g/dL 83.2  88.0  87.8   Hematocrit 36.0 - 46.0 % 49.0  33.2  36.1   Platelets 150 - 400 K/uL  131  140      Anemia Panel:  Recent Labs    04/20/24 0516 04/21/24 0423 04/22/24 0455 05/24/24 1331 05/24/24 1453  HGB 11.7* 12.0 12.1 11.9* 16.7*  MCV 73.4* 72.5* 72.5* 73.8*  --     No results for input(s): AST, ALT, ALKPHOS, BILITOT, PROT, ALBUMIN in the last 168 hours.  Lab Results  Component Value Date   HGBA1C 6.1 (A) 10/24/2023    ROS:  Pertinent items noted in HPI and remainder of comprehensive ROS otherwise negative.  Physical Exam: Vitals:   05/24/24 1415 05/24/24 1430  BP: (!) 112/59 123/66  Pulse: 83 81  Resp: (!) 21 (!) 21  Temp:    SpO2: 98% 100%     General exam: On BiPAP, alert awake. Respiratory system: Coarse breath sound bilateral. Cardiovascular system: S1 & S2 heard, RRR.  No pedal edema. Gastrointestinal system: Abdomen is nondistended, soft and nontender. Normal bowel sounds heard. Central nervous system: Alert and oriented. No focal neurological deficits. Extremities: No edema, no cyanosis. Skin: No rashes, lesions or ulcers Psychiatry: Judgement and insight appear normal. Mood & affect appropriate.   Assessment/Plan:  # Acute on chronic respiratory failure with hypoxia likely due to bronchopulmonary pneumonia versus underlying edema. Currently on BiPAP and received IV diuretics.  Also on empiric antibiotics to treat pneumonia.  Bronchodilators,  oxygen  per primary team.  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 Checking UA, UPC, transplant kidney ultrasound. Currently receiving trial of IV Lasix  for presumed pulmonary edema.  If creatinine level worsened tomorrow then we will hold further diuretics. Strict ins and outs and daily lab. Repeat BMP later today.  I think the potassium of 6.9 is not true.  # History of kidney transplant in 2012: Continue tacrolimus  and prednisone .  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  # History of hypertension: Hold antihypertensive medication.  Monitor BP.  Discussed with the primary team.  Thank you for the consult we will continue to follow.  Jacion Dismore Amelie Romney 05/24/2024, 5:22 PM  [facility].

## 2024-05-24 NOTE — H&P (Cosign Needed)
 Date: 05/24/2024               Patient Name:  [name] MRN: [id number]  DOB: 01-Jan-1941 Age / Sex: 83 y.o., female   PCP: Beam, Lamar POUR, MD         Medical Service: Internal Medicine Teaching Service         Attending Physician: Dr. MICAEL Riis Winfrey      First Contact: Sallyanne Primas, DO}    Second Contact: Dr. Hadassah Kristy Ahr, MD          Pager Information: First Contact Pager: [phone]   Second Contact Pager: [phone]   SUBJECTIVE   Chief Complaint: Shortness of Breath   History of Present Illness: [name] is a 83 yo female with history of HEpEF (60-65%), COPD on 4 L [place] at home and renal transplant (2012, on prednisolone 5 mg and tacrolimus ) presented to ED with SOB for 3-4 days, despite breathing treatments at home. EMS reports 55% on room air initially, improved to 83% on NRB.   Last hospitalization was 6/3 for SOB and twice the month before for SOB due to CHF/COPD. V/Q study negative for PE at the time.  started on Torsemide  20 mg daily. Patient said she had been taking this medication.   1 week ago patient says she noticed her urine output decreased, around this time she noticed more difficulty breathing. Today, the patient stated that she could not do any activity (go to bathroom or get out of bed) without dyspnea. Patient did not utilize her inhalers today.   Presents with SOB  Endorses: Dyspnea, diarrhea (3 days, 3 bowel movements just today), productive cough, weakness, fatigue, decreased urine output (1 week ago), weight loss (around 50 pounds in last 3 years), decreased appetite (takes two bites of food and feels like can't swallow- over the past three years)  Denies: chest pain, fever  ED Course: Labs significant for:  pCO2 40.7, pO2 180 BUN 44, Cr 2.61 (previous admission on discharge Cr 1.56) BNP 1,819 Leukocytosis (12.3) Hemoglobin 11.9/Hematocrit 33.2/MCV 73.8 Imaging:  CXR: pulm edema and possible PNA EKG: no acute ischemia    Received  Lasix  40 mg IV Nebulizer for 1 hour  Bipap  Steroids with EMS  Rocephin  and doxy for possible pneumonia - afebrile  On cardiac monitor - NSR and tachycardia   Meds:  Per last hospitalization:  acetaminophen  (TYLENOL ) 650 MG CR tablet Take 650 mg by mouth every morning.       [provider]  albuterol  (PROVENTIL ) (2.5 MG/3ML) 0.083% nebulizer solution Take 2.5 mg by nebulization every 6 (six) hours as needed for wheezing or shortness of breath.       [provider]  albuterol  (VENTOLIN  HFA) 108 (90 Base) MCG/ACT inhaler Inhale 1-2 puffs into the lungs every 6 (six) hours as needed for wheezing or shortness of breath.       [provider]  amLODipine  (NORVASC ) 10 MG tablet Take 10 mg by mouth daily.        [provider]  aspirin  EC 81 MG tablet Take 81 mg by mouth daily.       [provider]  Azelastine  HCl 0.15 % SOLN Place 1 spray into both nostrils daily as needed for allergies.       [provider]  budesonide  (PULMICORT ) 0.25 MG/2ML nebulizer solution Inhale 0.25 mg into the lungs as needed. 02/11/20     [provider]  calcitRIOL  (ROCALTROL ) 0.25  MCG capsule Take 0.25 mcg by mouth daily.        [provider]  ezetimibe  (ZETIA ) 10 MG tablet TAKE 1 TABLET BY MOUTH EVERY DAY 01/16/24     Nahser, Aleene PARAS, MD  fluticasone  (FLONASE ) 50 MCG/ACT nasal spray Place into both nostrils. 05/30/21     [provider]  Fluticasone -Umeclidin-Vilant (TRELEGY ELLIPTA) 200-62.5-25 MCG/ACT AEPB Inhale 1 puff into the lungs daily.       [provider]  isosorbide  mononitrate (IMDUR ) 30 MG 24 hr tablet Take 1 tablet (30 mg total) by mouth daily. 04/15/24     Nahser, Aleene PARAS, MD  KLOR-CON  M20 20 MEQ tablet TAKE 1 TABLET BY MOUTH EVERY DAY 11/22/23     Nahser, Aleene PARAS, MD  metoprolol  tartrate (LOPRESSOR ) 50 MG tablet TAKE 1 TABLET BY MOUTH TWICE A DAY 07/24/23     Nahser, Aleene PARAS, MD  nitroGLYCERIN   (NITROSTAT ) 0.4 MG SL tablet Place 1 tablet (0.4 mg total) under the tongue every 5 (five) minutes as needed for chest pain. 10/03/22     Nahser, Aleene PARAS, MD  omeprazole (PRILOSEC) 20 MG capsule Take 20 mg by mouth daily after breakfast.       [provider]  ONETOUCH VERIO test strip 1 EACH BY OTHER ROUTE DAILY IN THE AFTERNOON. USE AS INSTRUCTED 07/02/23     Shamleffer, Ibtehal Jaralla, MD  predniSONE  (DELTASONE ) 20 MG tablet 2 tabs po daily x 4 days 04/10/24     Freddi Hamilton, MD  tacrolimus  (PROGRAF ) 1 MG capsule Take 4 mg by mouth See admin instructions. Take 4 mg by mouth at 9 AM and 4 mg at 9 PM       [provider]  Tiotropium Bromide  Monohydrate 1.25 MCG/ACT AERS Inhale 1 puff into the lungs daily. 10/23/17     [provider]    Past Medical History COPD, very severe  On home oxygen  therapy 4 L HFpEF (55-60%) CKD stage 3a Kidney Transplant status (right) CAD Shingles (flair now) Type 2 DM HTN  Past Surgical History Past Surgical History:  Procedure Laterality Date   ABDOMINAL HYSTERECTOMY  1980'S   ARTERIOVENOUS GRAFT PLACEMENT Left 03/04/2007   forearm   BIOPSY  09/30/2019   Procedure: BIOPSY;  Surgeon: Dianna Specking, MD;  Location: [facility];  Service: Endoscopy;;   BIOPSY  12/27/2021   Procedure: BIOPSY;  Surgeon: Dianna Specking, MD;  Location: [facility];  Service: Endoscopy;;   BREAST EXCISIONAL BIOPSY Right    benign more than 10 yr ago   BREAST SURGERY  YRS AGO   RT BREAST CYST REMOVED    CARDIAC CATHETERIZATION  06/03/2002; 12/04/2007   COLONOSCOPY WITH PROPOFOL  N/A 07/14/2014   Procedure: COLONOSCOPY WITH PROPOFOL ;  Surgeon: Specking KYM Dianna, MD;  Location: [facility];  Service: Endoscopy;  Laterality: N/A;   COLONOSCOPY WITH PROPOFOL  N/A 09/30/2019   Procedure: COLONOSCOPY WITH PROPOFOL ;  Surgeon: Dianna Specking, MD;  Location: [facility];  Service: Endoscopy;  Laterality: N/A;   COLONOSCOPY WITH PROPOFOL  N/A  12/27/2021   Procedure: COLONOSCOPY WITH PROPOFOL ;  Surgeon: Dianna Specking, MD;  Location: [facility];  Service: Endoscopy;  Laterality: N/A;   CORONARY ARTERY BYPASS GRAFT  06/06/2002   x 4   ESOPHAGOGASTRODUODENOSCOPY N/A 12/27/2021   Procedure: ESOPHAGOGASTRODUODENOSCOPY (EGD);  Surgeon: Dianna Specking, MD;  Location: [facility];  Service: Endoscopy;  Laterality: N/A;   HEMOSTASIS CLIP PLACEMENT  12/27/2021   Procedure: HEMOSTASIS CLIP PLACEMENT;  Surgeon: Dianna Specking, MD;  Location:  [facility];  Service: Endoscopy;;   HOT HEMOSTASIS N/A 07/14/2014   Procedure: HOT HEMOSTASIS (ARGON PLASMA COAGULATION/BICAP);  Surgeon: Jerrell KYM Sol, MD;  Location: [facility];  Service: Endoscopy;  Laterality: N/A;   IR FLUORO GUIDE CV LINE RIGHT  12/22/2019   IR US  GUIDE VASC ACCESS RIGHT  12/22/2019   KIDNEY TRANSPLANT Right 08/04/2011   ORIF PATELLA Left 04/06/2016   Procedure: OPEN REDUCTION INTERNAL (ORIF) LEFT  PATELLA;  Surgeon: Toribio JULIANNA Chancy, MD;  Location: [facility];  Service: Orthopedics;  Laterality: Left;   POLYPECTOMY  09/30/2019   Procedure: POLYPECTOMY;  Surgeon: Sol Jerrell, MD;  Location: [facility];  Service: Endoscopy;;   POLYPECTOMY  12/27/2021   Procedure: POLYPECTOMY;  Surgeon: Sol Jerrell, MD;  Location: [facility];  Service: Endoscopy;;   SHOULDER ARTHROSCOPY WITH ROTATOR CUFF REPAIR AND SUBACROMIAL DECOMPRESSION Left 08/03/2016   Procedure: LEFT SHOULDER ARTHROSCOPY WITH ROTATOR CUFF REPAIR AND SUBACROMIAL DECOMPRESSION with distal claviculectomy and extentsive debridement;  Surgeon: Toribio JULIANNA Chancy, MD;  Location: [facility];  Service: Orthopedics;  Laterality: Left;  Block   THROMBECTOMY AND REVISION OF ARTERIOVENTOUS (AV) GORETEX  GRAFT Left 05/08/2007   forearm   TOTAL HIP ARTHROPLASTY  12/18/2012   Procedure: TOTAL HIP ARTHROPLASTY;  Surgeon: Toribio JULIANNA Chancy, MD;  Location: [facility];  Service: Orthopedics;  Laterality:  Left;   VESICOVAGINAL FISTULA CLOSURE W/ TAH  1984    Social:  Lives With: Grandson Occupation: retired, used to work at [employer]  Support: Long term best friend. Helps with appointments  PCP:  Beam, Lamar POUR, MD  Substances: -Tobacco: former cigarette smoker. Quit smoking 40 years ago   Family History:  Family History  Problem Relation Age of Onset   Prostate cancer Brother    Lung cancer Brother    Cancer Neg Hx      Allergies: Allergies as of 05/24/2024 - Review Complete 05/24/2024  Allergen Reaction Noted   Sodium hypochlorite Shortness Of Breath 12/10/2019   Lactose Nausea And Vomiting 03/07/2016   Asa arthritis strength-antacid [aspirin  buffered] Nausea And Vomiting and Other (See Comments) 07/04/2011   Aspirin  Nausea And Vomiting 03/07/2016   Atorvastatin Other (See Comments)    Banana Nausea And Vomiting 10/16/2011   Lactose intolerance (gi) Nausea And Vomiting 12/05/2012   Lisinopril Cough 06/20/2012    Review of Systems: A complete ROS was negative except as per HPI.   OBJECTIVE:   Physical Exam: Blood pressure 123/66, pulse 81, temperature 97.8 F (36.6 C), resp. rate (!) 21, height 5' 2 (1.575 m), weight 54.4 kg, last menstrual period 03/04/2022, SpO2 100%.   Physical Exam Constitutional:      General: She is in acute distress.     Appearance: She is ill-appearing.  Cardiovascular:     Rate and Rhythm: Normal rate and regular rhythm.  Pulmonary:     Effort: Accessory muscle usage present.     Breath sounds: Examination of the right-lower field reveals rales. Examination of the left-lower field reveals rales. Rales present.     Comments: On bipap Abdominal:     Palpations: Abdomen is soft.     Tenderness: There is no guarding.  Musculoskeletal:     Right lower leg: No edema.     Left lower leg: No edema.  Skin:    Comments: Patient was shivering at bedside, skin was cool  Neurological:     General: No focal deficit present.      Mental Status: She  is alert.     Labs:     Component Value Date/Time   WBC 12.3 (H) 05/24/2024 1331   RBC 4.50 05/24/2024 1331   HGB 16.7 (H) 05/24/2024 1453   HGB 12.8 02/28/2021 1014   HGB 9.8 (L) 10/27/2010 0946   HCT 49.0 (H) 05/24/2024 1453   HCT 39.0 02/28/2021 1014   HCT 30.1 (L) 10/27/2010 0946   PLT 131 (L) 05/24/2024 1331   PLT 148 (L) 02/28/2021 1014   MCV 73.8 (L) 05/24/2024 1331   MCV 77 (L) 02/28/2021 1014   MCV 85.8 10/27/2010 0946   MCH 26.4 05/24/2024 1331   MCHC 35.8 05/24/2024 1331   RDW 19.8 (H) 05/24/2024 1331   RDW 16.6 (H) 02/28/2021 1014   RDW 19.1 (H) 10/27/2010 0946   LYMPHSABS 1.7 05/24/2024 1331   LYMPHSABS 1.4 10/27/2010 0946   MONOABS 0.6 05/24/2024 1331   MONOABS 0.7 10/27/2010 0946   EOSABS 0.1 05/24/2024 1331   EOSABS 0.0 10/27/2010 0946   BASOSABS 0.0 05/24/2024 1331   BASOSABS 0.0 10/27/2010 0946     CMP     Component Value Date/Time   NA 138 05/24/2024 1800   NA 143 03/09/2021 0818   K 4.9 05/24/2024 1800   CL 102 05/24/2024 1800   CO2 16 (L) 05/24/2024 1800   GLUCOSE 327 (H) 05/24/2024 1800   BUN 46 (H) 05/24/2024 1800   BUN 26 03/09/2021 0818   CREATININE 2.94 (H) 05/24/2024 1800   CREATININE 1.10 (H) 06/13/2016 0825   CALCIUM  8.9 05/24/2024 1800   CALCIUM  9.8 02/26/2012 1715   PROT 6.0 (L) 05/24/2024 1800   PROT 6.4 09/02/2021 0800   ALBUMIN 2.6 (L) 05/24/2024 1800   ALBUMIN 2.6 (L) 05/24/2024 1800   ALBUMIN 4.3 09/02/2021 0800   AST 31 05/24/2024 1800   ALT 13 05/24/2024 1800   ALKPHOS 47 05/24/2024 1800   BILITOT 0.7 05/24/2024 1800   BILITOT 0.4 09/02/2021 0800   GFRNONAA 16 (L) 05/24/2024 1800   GFRAA 56 (L) 12/23/2019 0455    Imaging:  DG Chest Portable 1 View Result Date: 05/24/2024 CLINICAL DATA:  Shortness of breath. EXAM: PORTABLE CHEST 1 VIEW COMPARISON:  Radiograph 04/20/2024 and 04/17/2024.  CT 04/19/2024. FINDINGS: 1336 hours. The heart size and mediastinal contours are stable status post median  sternotomy and CABG. There is mild cardiomegaly and aortic atherosclerosis. There are new patchy, basilar predominant airspace opacities in both lungs without consolidation. No significant pleural effusion or pneumothorax. No acute osseous findings are evident. The upper sternotomy wires are chronically fractured. IMPRESSION: New patchy, basilar predominant airspace opacities in both lungs, suspicious for edema superimposed on emphysema or multifocal bronchopneumonia. Radiographic follow-up recommended. Electronically Signed   By: Elsie Perone M.D.   On: 05/24/2024 13:54     EKG: personally reviewed my interpretation is sinus rhythm, no acute ischemia.   ASSESSMENT & PLAN:   Assessment & Plan by Problem:  [name] is a 83 yo female with history of HEpEF (60-65%), COPD on 4 L [place] at home and renal transplant (2012, on prednisolone 5 mg and tacrolimus ) presented to ED with SOB for 3-4 days, despite breathing treatments at home. EMS reports 55% on room air initially, improved to 83% on NRB.   #Acute on Chronic Respiratory Failure with hypoxia #COPD exacerbation  #bronchopulmonary pneumonia  -on exam patient had crackles, no LE edema, using accessory muscles to breath, and cachetic -On Bipap: inspiratory pressure 12, expiratory pressure 5. If patient unable to tolerate  bipap, will have to go to ICU to be intubated. Nebulized hospitalized trelogy  -pulmonary edema and possible pneumonia demonstrated on CXR.          -Received 40 mg of IV lasix  + 80 mg IV lasix  in ED           -Lactic Acid 8.0, bicarb 16, leukocytosis.           -Concern for PJP due to immunocompromised state post-renal transplant on tacrolimus            -Abx coverage: ceftriaxone  1g (d/c), linezolid  600 mg, zosyn  3.375 g. Will consider PJP prophylaxis if no improvement           -Really requires fluids, however that is high risk for pulmonary edema  -on Arformoterol , budesonide , and revefenacin  nebulizer  -PCCM being  consulted #HFpEF -BNP 1,819 -Lasix  trialed with poor output  #Acute Kidney Injury on CKD 3a.  #History of Kidney Transplant 2012 #Cardiorenal syndrome  -Nephrology consulted and ordered: UA, UPC, transplant kidney ultrasound, strict I/O -Fractional excretion of Urea (urine urea, urine Cr) labs ordered to investigate cause of Kidney injury - likely due to hypoperfusion in setting of pneumonia and cardiorenal syndrome  -Cr 2.61 on admit; baseline Cr level seems to be fluctuating 1.2-1.7  -potassium in I-stat was >6, I do not believe this is true, it was likely hemolyzed. RFP later without intervention was 4.9  -Monitoring Cr, if worsens will hold diuretics  -Bladder scan demonstrated 14 mL urine -continuing tacrolimus  and prednisone  for post-transplant status  #HTN -holding HTN medications, monitoring BP  Best practice: Diet: NPO VTE: subQ heparin   Code: Full  Disposition planning: Prior to Admission Living Arrangement: Home, living with grandson, friend as support  Anticipated Discharge Location: Home  Dispo: Admit patient to Inpatient with expected length of stay greater than 2 midnights.  Signed: Benuel Braun, DO Internal Medicine Resident  05/24/2024, 7:00 PM  Please contact IM Residency On-Call Pager at: [phone] or [phone].

## 2024-05-24 NOTE — ED Notes (Signed)
 This RN applied patient on NRB immediatly once in room. RT called to room.

## 2024-05-24 NOTE — Progress Notes (Addendum)
 IMTS Cross Cover Note  Patient is re-evaluated at bedside with family present. Patient is currently on BiPAP with FiO2 60%, RR 25, SpO2 95%, HR 95, 127/64 (82). On exam, she is using accessory muscles. Lung exam is notable for bibasilar crackles antero-laterally. Extremities are warm to touch, well perfused. She reports that she gave a urine sample to the RN.    Exam: Vitals:   05/24/24 2015 05/24/24 2023  BP: 127/64   Pulse: 94   Resp: (!) 22   Temp:    SpO2: 93% 96%    Plan:  This is a 83 year old female past medical history significant for hypertension, ESRD status post DCD kidney donor in 2012, COPD on 4-6 L oxygen  at home, CAD status post CABG, with multiple recent hospitalizations who presented today for worsening dyspnea, admitted for acute hypoxic respiratory failure secondary to COPD/bronchopulmonary pneumonia.  Initially in the ED, she was placed on BiPAP, failed trial off BiPAP due to respiratory distress.  She was placed back on BiPAP around 5 PM, upon reassessment now, patient is currently on FiO2 60% saturating at 95%.  Unfortunate she is not a candidate for progressive unit transfer with her current respiratory distress and instability. Lab finding pertinent for lactic acid of 8.0 and COVID positive, likely will require fluid resuscitation. PCCM is on board, will appreciate their evaluation.   - PCCM on board, will evaluate patient soon  - Recheck VBG 2100 to evaluate BiPAP  - May need to consider intubation if patient continuous to be in respiratory distress as BiPAP is temporary measure   Toma Edwards, DO Internal Medicine Resident PGY-2 Please contact the on-call pager after 5 pm and on weekends at [phone]

## 2024-05-24 NOTE — Hospital Course (Addendum)
 [name] is a 83 yo female with history of HEpEF (60-65%), COPD on  4L [place] at rest and 6L with exertion and renal transplant (2012, on prednisolone 5 mg and tacrolimus ) presented to ED with SOB for 3-4 days, despite breathing treatments at home. EMS reports 55% on room air initially, improved to 83% on NRB.   Last hospitalization was 6/3 for SOB and twice the month before for SOB due to CHF/COPD. V/Q study negative for PE at the time.  started on  -Torsemide  20 mg daily   Received in ED:  Lasix   Nebulizer for 1 hour  Steroids with EMS  Rocephin  and doxy for possible pneumonia - afebrile  On cardiac monitor - NSR and tachycardia   Imaging:  CXR: pulm edema and possible PNA EKG: no acute ischemia    Needs further diuresis  CT scan to double check pneumonia **iodine issues   Lives with grandson

## 2024-05-24 NOTE — ED Provider Notes (Signed)
 Patient have been slotted for admission by unassigned medicine.  Apparently the team came by and took her off of her BiPAP and she did not do well.  Respiratory is got her back on her BiPAP.  Team now does not want to admit her think she needs a good ICU.  Patient back on BiPAP satting 94% and mentating fine and feeling more comfortable with her breathing.  It was somewhat predictable that she would not do well off of BiPAP unfortunately I was not aware they were trying to do that.  Will recontact the admitting team for admission it will need to be a progressive admission this does not seem to be an ICU type admission.   [name] Dobosz, MD 05/24/24 [phone]

## 2024-05-24 NOTE — ED Notes (Signed)
 NT bladder scanned pt pt had 14 mL in bladder

## 2024-05-24 NOTE — Consult Note (Addendum)
 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 No f/c/r, LL edema, CP, N/V, abd pain, rash, melena, or blood per rectum. Given Lasix  120 mg, Solumedrol 125 mg, Zosyn , and Linezolid . Reportedly oxygen  saturation was around 55% on 4 L, treated with a DuoNeb with improvement of saturation to 83% on NRM. On BiPAP 08/25/59%, RR 15, saturating at 95%. COVID positive.   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 On BiPAP 08/25/59%, RR 15, saturating at 95%. 05/24/2024: will admit to ICU  Interim History / Subjective:    Objective    Blood pressure 112/74, pulse 94, temperature 97.7 F (36.5 C), temperature source Axillary, resp. rate (!) 27, height 5' 2 (1.575 m), weight 54.4 kg, last menstrual period 03/04/2022, SpO2 96%.    FiO2 (%):  [100 %] 100 % PEEP:  [5 cmH20] 5 cmH20 Pressure Support:  [5 cmH20] 5 cmH20   Intake/Output Summary (Last 24 hours) at 05/24/2024 2155 Last data  filed at 05/24/2024 1737 Gross per 24 hour  Intake 250 ml  Output --  Net 250 ml   Filed Weights   05/24/24 1326  Weight: 54.4 kg    Examination: General: alert, oriented x4, and comfortable on BiPAP. SpO2 95%  HENT: PERL, normal pharynx and oral mucosa. No LNE or thyromegaly. No JVD Lungs: symmetrical air entry bilaterally. Basal crackles. Exp wheezing Cardiovascular: NL S1/S2. No m/g/r Abdomen: no distension or tenderness Extremities: no edema. Symmetrical  Neuro: nonfocal    Resolved problem list   Assessment and Plan  Acute on chronic hypoxic respiratory failure due to COVID PNA -BiPAP -Isolation: airborne and contact  -IVF -Remdesivir  -Decadron  -ISS -Bronchodilators -FiO2 for SpO2 >92%, wean as tolerated -s/p Zosyn  and Linezolid  -Resp and blood Cx -MRSA screening -Resp Viral PCR panel    AKI/CKD-3a: dehydration Hx of kidney transplant in 2012 -renal U/S -nephrology consult -I/O chart -IVF -d/c Lasix  -Hold BP meds -am labs -Continue tacrolimus  and steroids  Lactic acidosis due to dehydration and sepsis  -IVF -Serial LA  HTN -Hold antihypertensive medication -Monitor BP   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 -Resume home meds  CAD/CABG, D-CHF (EF 60% with 2 G2DD) -Hold home meds due to borderline BP  DM-2 -Glycemic control -HbA1c  GERD -H2B  Chronic thrombocytopenia -Monitor   Best Practice (right click and Reselect all SmartList Selections daily)   Diet/type: NPO w/ oral meds DVT prophylaxis prophylactic heparin   Pressure ulcer(s): N/A GI prophylaxis: H2B Lines: N/A Foley:  N/A Code Status:  full code Last date of multidisciplinary goals of care discussion []   Labs   CBC: Recent Labs  Lab 05/24/24 1331 05/24/24 1453  WBC 12.3*  --   NEUTROABS 9.7*  --   HGB 11.9* 16.7*  HCT 33.2* 49.0*  MCV 73.8*  --   PLT 131*  --     Basic Metabolic Panel: Recent Labs  Lab 05/24/24 1436 05/24/24 1453 05/24/24 1800  NA 138 133*  138  K 4.8 6.9* 4.9  CL 102  --  102  CO2 22  --  16*  GLUCOSE 213*  --  327*  BUN 44*  --  46*  CREATININE 2.61*  --  2.94*  CALCIUM  8.7*  --  8.9  MG  --   --  2.7*  PHOS  --   --  4.3   GFR: Estimated Creatinine Clearance: 11.9 mL/min (A) (by C-G formula based on SCr of 2.94 mg/dL (H)). Recent Labs  Lab 05/24/24 1331 05/24/24 1814  WBC 12.3*  --   LATICACIDVEN  --  8.0*    Liver Function Tests: Recent Labs  Lab 05/24/24 1800  AST 31  ALT 13  ALKPHOS 47  BILITOT 0.7  PROT 6.0*  ALBUMIN 2.6*  2.6*   No results for input(s): LIPASE, AMYLASE in the last 168 hours. No results for input(s): AMMONIA in the last 168 hours.  ABG    Component Value Date/Time   PHART 7.406 (H) 10/16/2011 1057   PCO2ART 36.2 10/16/2011 1057   PO2ART 102.0 (H) 10/16/2011 1057   HCO3 18.3 (L) 05/24/2024 2100   TCO2 26 05/24/2024 1453   ACIDBASEDEF 7.9 (H) 05/24/2024 2100   O2SAT 78.2 05/24/2024 2100     Coagulation Profile: No results for input(s): INR, PROTIME in the last 168 hours.  Cardiac Enzymes: No results for input(s): CKTOTAL, CKMB, CKMBINDEX, TROPONINI in the last 168 hours.  HbA1C: Hemoglobin A1C  Date/Time Value Ref Range Status  10/24/2023 10:31 AM 6.1 (A) 4.0 - 5.6 % Final  01/01/2023 10:01 AM 6.0 (A) 4.0 - 5.6 % Final   Hgb A1c MFr Bld  Date/Time Value Ref Range Status  02/13/2018 03:05 AM 5.7 (H) 4.8 - 5.6 % Final    Comment:    (NOTE) Pre diabetes:          5.7%-6.4% Diabetes:              >6.4% Glycemic control for   <7.0% adults with diabetes   02/28/2013 02:56 PM 5.5 4.6 - 6.5 % Final    Comment:    Glycemic Control Guidelines for People with Diabetes:Non Diabetic:  <6%Goal of Therapy: <7%Additional Action Suggested:  >8%     CBG: No results for input(s): GLUCAP in the last 168 hours.  Review of Systems:   Review of Systems  Constitutional:  Positive for malaise/fatigue. Negative for chills, diaphoresis, fever and weight loss.   HENT:  Negative for congestion, sinus pain and sore throat.   Respiratory:  Positive for cough, shortness of breath and wheezing. Negative for hemoptysis, sputum production and stridor.   Cardiovascular:  Negative for chest pain, palpitations, orthopnea, claudication, leg swelling and PND.  Gastrointestinal:  Positive for diarrhea. Negative for abdominal pain, blood in stool, constipation, heartburn, melena, nausea and vomiting.  Genitourinary:  Negative for dysuria and urgency.  Skin:  Negative for rash.     Past Medical History:  She,  has a past medical history of Anemia, Asthma, Cellulitis (12/19/2019), Coronary artery disease, Diabetes mellitus, ESRD on dialysis [facility]), Family history of breast cancer, Family history of  prostate cancer, GERD (gastroesophageal reflux disease), GI bleed (11/26/2010), Gout, History of methicillin resistant staphylococcus aureus (MRSA), Hyperlipidemia, Hyperparathyroidism, Hypertension, Myocardial infarction (HCC), Osteoporosis, Pneumonia (08/2010 ), Primary osteoarthritis, left shoulder (08/01/2016), S/P kidney transplant, and Subacromial impingement of left shoulder (08/01/2016).   Surgical History:   Past Surgical History:  Procedure Laterality Date   ABDOMINAL HYSTERECTOMY  1980'S   ARTERIOVENOUS GRAFT PLACEMENT Left 03/04/2007   forearm   BIOPSY  09/30/2019   Procedure: BIOPSY;  Surgeon: Dianna Specking, MD;  Location: [facility];  Service: Endoscopy;;   BIOPSY  12/27/2021   Procedure: BIOPSY;  Surgeon: Dianna Specking, MD;  Location: [facility];  Service: Endoscopy;;   BREAST EXCISIONAL BIOPSY Right    benign more than 10 yr ago   BREAST SURGERY  YRS AGO   RT BREAST CYST REMOVED    CARDIAC CATHETERIZATION  06/03/2002; 12/04/2007   COLONOSCOPY WITH PROPOFOL  N/A 07/14/2014   Procedure: COLONOSCOPY WITH PROPOFOL ;  Surgeon: Specking KYM Dianna, MD;  Location: [facility];  Service: Endoscopy;  Laterality: N/A;   COLONOSCOPY WITH PROPOFOL  N/A  09/30/2019   Procedure: COLONOSCOPY WITH PROPOFOL ;  Surgeon: Dianna Specking, MD;  Location: [facility];  Service: Endoscopy;  Laterality: N/A;   COLONOSCOPY WITH PROPOFOL  N/A 12/27/2021   Procedure: COLONOSCOPY WITH PROPOFOL ;  Surgeon: Dianna Specking, MD;  Location: [facility];  Service: Endoscopy;  Laterality: N/A;   CORONARY ARTERY BYPASS GRAFT  06/06/2002   x 4   ESOPHAGOGASTRODUODENOSCOPY N/A 12/27/2021   Procedure: ESOPHAGOGASTRODUODENOSCOPY (EGD);  Surgeon: Dianna Specking, MD;  Location: [facility];  Service: Endoscopy;  Laterality: N/A;   HEMOSTASIS CLIP PLACEMENT  12/27/2021   Procedure: HEMOSTASIS CLIP PLACEMENT;  Surgeon: Dianna Specking, MD;  Location: [facility];  Service: Endoscopy;;   HOT HEMOSTASIS N/A 07/14/2014   Procedure: HOT HEMOSTASIS (ARGON PLASMA COAGULATION/BICAP);  Surgeon: Specking KYM Dianna, MD;  Location: [facility];  Service: Endoscopy;  Laterality: N/A;   IR FLUORO GUIDE CV LINE RIGHT  12/22/2019   IR US  GUIDE VASC ACCESS RIGHT  12/22/2019   KIDNEY TRANSPLANT Right 08/04/2011   ORIF PATELLA Left 04/06/2016   Procedure: OPEN REDUCTION INTERNAL (ORIF) LEFT  PATELLA;  Surgeon: Toribio JULIANNA Chancy, MD;  Location: [facility];  Service: Orthopedics;  Laterality: Left;   POLYPECTOMY  09/30/2019   Procedure: POLYPECTOMY;  Surgeon: Dianna Specking, MD;  Location: [facility];  Service: Endoscopy;;   POLYPECTOMY  12/27/2021   Procedure: POLYPECTOMY;  Surgeon: Dianna Specking, MD;  Location: [facility];  Service: Endoscopy;;   SHOULDER ARTHROSCOPY WITH ROTATOR CUFF REPAIR AND SUBACROMIAL DECOMPRESSION Left 08/03/2016   Procedure: LEFT SHOULDER ARTHROSCOPY WITH ROTATOR CUFF REPAIR AND SUBACROMIAL DECOMPRESSION with distal claviculectomy and extentsive debridement;  Surgeon: Toribio JULIANNA Chancy, MD;  Location: [facility];  Service: Orthopedics;  Laterality: Left;  Block   THROMBECTOMY AND REVISION OF ARTERIOVENTOUS (AV) GORETEX  GRAFT Left  05/08/2007   forearm   TOTAL HIP ARTHROPLASTY  12/18/2012   Procedure: TOTAL HIP ARTHROPLASTY;  Surgeon: Toribio JULIANNA Chancy, MD;  Location: [facility];  Service: Orthopedics;  Laterality: Left;   VESICOVAGINAL FISTULA CLOSURE W/ TAH  1984     Social History:   reports that she quit smoking about 40 years ago. Her smoking use included cigarettes. She started smoking about 48 years ago. She has a 8 pack-year smoking history. She has never used smokeless tobacco. She reports that she does not drink alcohol and does not use drugs.   Family History:  Her family history includes  Lung cancer in her brother; Prostate cancer in her brother. There is no history of Cancer.   Allergies Allergies  Allergen Reactions   Sodium Hypochlorite Shortness Of Breath   Lactose Nausea And Vomiting   Asa Arthritis Strength-Antacid [Aspirin  Buffered] Nausea And Vomiting and Other (See Comments)    STOMACH BURNS, also   Aspirin  Nausea And Vomiting    Per pt. can tolerate the enteric coated tablets.    Atorvastatin Other (See Comments)    Patient's skin was skin was sensitive ALL NSAIDS   Banana Nausea And Vomiting   Lactose Intolerance (Gi) Nausea And Vomiting   Lisinopril Cough     Home Medications  Prior to Admission medications   Medication Sig Start Date End Date Taking? Authorizing Provider  acetaminophen  (TYLENOL ) 650 MG CR tablet Take 650 mg by mouth daily as needed for pain.   Yes [provider]  albuterol  (PROVENTIL ) (2.5 MG/3ML) 0.083% nebulizer solution Take 2.5 mg by nebulization every 6 (six) hours as needed for wheezing or shortness of breath.   Yes [provider]  albuterol  (VENTOLIN  HFA) 108 (90 Base) MCG/ACT inhaler Inhale 2 puffs into the lungs every 6 (six) hours as needed for wheezing or shortness of breath.   Yes [provider]  amLODipine  (NORVASC ) 10 MG tablet Take 10 mg by mouth daily.    Yes [provider]  aspirin  EC 81 MG tablet Take 81 mg by  mouth daily.   Yes [provider]  budesonide  (PULMICORT ) 0.25 MG/2ML nebulizer solution Inhale 0.25 mg into the lungs in the morning, at noon, in the evening, and at bedtime. 02/11/20  Yes [provider]  calcitRIOL  (ROCALTROL ) 0.25 MCG capsule Take 0.25 mcg by mouth daily.    Yes [provider]  ezetimibe  (ZETIA ) 10 MG tablet TAKE 1 TABLET BY MOUTH EVERY DAY 01/16/24  Yes Nahser, Aleene PARAS, MD  fluticasone  (FLONASE ) 50 MCG/ACT nasal spray Place 1 spray into both nostrils in the morning, at noon, and at bedtime. 05/30/21  Yes [provider]  Fluticasone -Umeclidin-Vilant (TRELEGY ELLIPTA) 200-62.5-25 MCG/ACT AEPB Inhale 1 puff into the lungs in the morning, at noon, and at bedtime.   Yes [provider]  isosorbide  mononitrate (IMDUR ) 30 MG 24 hr tablet Take 1 tablet (30 mg total) by mouth daily. 04/15/24  Yes Nahser, Aleene PARAS, MD  KLOR-CON  M20 20 MEQ tablet TAKE 1 TABLET BY MOUTH EVERY DAY 11/22/23  Yes Nahser, Aleene PARAS, MD  metoprolol  tartrate (LOPRESSOR ) 50 MG tablet TAKE 1 TABLET BY MOUTH TWICE A DAY 07/24/23  Yes Nahser, Aleene PARAS, MD  nitroGLYCERIN  (NITROSTAT ) 0.4 MG SL tablet Place 1 tablet (0.4 mg total) under the tongue every 5 (five) minutes as needed for chest pain. 10/03/22  Yes Nahser, Aleene PARAS, MD  omeprazole (PRILOSEC) 20 MG capsule Take 20 mg by mouth daily after breakfast.   Yes [provider]  predniSONE  (DELTASONE ) 5 MG tablet Take 5 mg by mouth daily with breakfast.   Yes [provider]  tacrolimus  (PROGRAF ) 1 MG capsule Take 4 mg by mouth See admin instructions. Take 4 mg by mouth at 9 AM and 4 mg at 9 PM   Yes [provider]  Tiotropium Bromide  Monohydrate 1.25 MCG/ACT AERS Inhale 1 puff into the lungs in the morning and at bedtime. 10/23/17  Yes [provider]  torsemide  (DEMADEX ) 20 MG tablet Take 1 tablet (20 mg total) by mouth daily. 04/23/24 07/22/24 Yes Dahal, Chapman, MD  Azelastine   HCl 0.15 % SOLN  Place 1 spray into both nostrils daily as needed for allergies. Patient not taking: Reported on 05/24/2024    [provider]  ONETOUCH VERIO test strip 1 EACH BY OTHER ROUTE DAILY IN THE AFTERNOON. USE AS INSTRUCTED 07/02/23   Shamleffer, Donell Cardinal, MD     Critical care time: 55 min    Mancel Ply, MD [facility] Pager: see AMION

## 2024-05-24 NOTE — Progress Notes (Signed)
 Pharmacy Antibiotic Note  [name] is a 83 y.o. female for which pharmacy has been consulted for zosyn  dosing for pneumonia.  Patient with a history of HFpEF, COPD, renal transplant (prednisolone & tacrolimus ). Patient presenting with SOB.  SCr 2.61 (CrCl 13.4) - up from 1.56 earlier this month WBC 12.3; T 97.8; HR 81; RR 21  Plan: Rocephin  and doxycycline  give x 1 in the ED Linezolid  per MD Zosyn  3.375g q8h (extended infusion) ---CrCl is borderline for adjustment. Follow. Monitor WBC, fever, renal function, cultures De-escalate when able F/u Nephrology plan  Height: 5' 2 (157.5 cm) Weight: 54.4 kg (120 lb) IBW/kg (Calculated) : 50.1  Temp (24hrs), Avg:97.8 F (36.6 C), Min:97.8 F (36.6 C), Max:97.8 F (36.6 C)  Recent Labs  Lab 05/24/24 1331 05/24/24 1436  WBC 12.3*  --   CREATININE  --  2.61*    Estimated Creatinine Clearance: 13.4 mL/min (A) (by C-G formula based on SCr of 2.61 mg/dL (H)).    Allergies  Allergen Reactions   Sodium Hypochlorite Shortness Of Breath   Lactose Nausea And Vomiting   Asa Arthritis Strength-Antacid [Aspirin  Buffered] Nausea And Vomiting and Other (See Comments)    STOMACH BURNS, also   Aspirin  Nausea And Vomiting    Per pt. can tolerate the enteric coated tablets.    Atorvastatin Other (See Comments)    Patient's skin was skin was sensitive   Banana Nausea And Vomiting   Lactose Intolerance (Gi) Nausea And Vomiting   Lisinopril Cough   Microbiology results: Pending  Thank you for allowing pharmacy to be a part of this patient's care.  Dorn Buttner, PharmD, BCPS 05/24/2024 5:28 PM ED Clinical Pharmacist -  [phone]

## 2024-05-24 NOTE — ED Triage Notes (Addendum)
 Pt bib [facility] from home for sob. Pt has difficult breathing for last two days. Fire reported that patient was 55% on 4L. Duo neb applied and patient saturation came up to 83%.   HX COPD, CHF   20g rforarm  125 solumedrol given 2 g mag   Ems vs 93/56 Hr 70 Rr25

## 2024-05-24 NOTE — ED Notes (Signed)
 Pt to bedside commode

## 2024-05-24 NOTE — ED Notes (Signed)
 CCMD notified. Pt is on monitor.

## 2024-05-24 NOTE — ED Provider Notes (Signed)
 [place] EMERGENCY DEPARTMENT AT [facility] Provider Note   CSN: [id number] Arrival date & time: 05/24/24  1319     Patient presents with: Shortness of Breath (/)   [name] is a 83 y.o. female with hx of COPD on 4L [place] at home presenting to ED with SOB for 3-4 days, despite breathing treatments at home.  EMS reports 55% on room air initially, improved to 83% on NRB.    Pt hospitalised last month 6/3 for SOB and twice the month before for SOB due to CHF/COPD both, V/Q study negative for PE at the time.  Started on torsemide  20 mg daily on last hospitalization, with echo EF 60-65%  Also hx of renal transplant 2012 on prednisolone 5 mg daily and tacrolimus , hx of diabetes    HPI     Prior to Admission medications   Medication Sig Start Date End Date Taking? Authorizing Provider  acetaminophen  (TYLENOL ) 650 MG CR tablet Take 650 mg by mouth daily as needed for pain.    [provider]  albuterol  (PROVENTIL ) (2.5 MG/3ML) 0.083% nebulizer solution Take 2.5 mg by nebulization every 6 (six) hours as needed for wheezing or shortness of breath.    [provider]  albuterol  (VENTOLIN  HFA) 108 (90 Base) MCG/ACT inhaler Inhale 2 puffs into the lungs every 6 (six) hours as needed for wheezing or shortness of breath.    [provider]  amLODipine  (NORVASC ) 10 MG tablet Take 10 mg by mouth daily.     [provider]  aspirin  EC 81 MG tablet Take 81 mg by mouth daily.    [provider]  Azelastine  HCl 0.15 % SOLN Place 1 spray into both nostrils daily as needed for allergies.    [provider]  budesonide  (PULMICORT ) 0.25 MG/2ML nebulizer solution Inhale 0.25 mg into the lungs in the morning, at noon, in the evening, and at bedtime. 02/11/20   [provider]  calcitRIOL  (ROCALTROL ) 0.25 MCG capsule Take 0.25 mcg by mouth daily.     [provider]  ezetimibe  (ZETIA ) 10 MG tablet TAKE 1 TABLET BY MOUTH EVERY  DAY 01/16/24   Nahser, Aleene PARAS, MD  fluticasone  (FLONASE ) 50 MCG/ACT nasal spray Place 1 spray into both nostrils in the morning, at noon, and at bedtime. 05/30/21   [provider]  Fluticasone -Umeclidin-Vilant (TRELEGY ELLIPTA) 200-62.5-25 MCG/ACT AEPB Inhale 1 puff into the lungs in the morning, at noon, and at bedtime.    [provider]  isosorbide  mononitrate (IMDUR ) 30 MG 24 hr tablet Take 1 tablet (30 mg total) by mouth daily. 04/15/24   Nahser, Aleene PARAS, MD  KLOR-CON  M20 20 MEQ tablet TAKE 1 TABLET BY MOUTH EVERY DAY 11/22/23   Nahser, Aleene PARAS, MD  metoprolol  tartrate (LOPRESSOR ) 50 MG tablet TAKE 1 TABLET BY MOUTH TWICE A DAY 07/24/23   Nahser, Aleene PARAS, MD  nitroGLYCERIN  (NITROSTAT ) 0.4 MG SL tablet Place 1 tablet (0.4 mg total) under the tongue every 5 (five) minutes as needed for chest pain. 10/03/22   Nahser, Aleene PARAS, MD  omeprazole (PRILOSEC) 20 MG capsule Take 20 mg by mouth daily after breakfast.    [provider]  ONETOUCH VERIO test strip 1 EACH BY OTHER ROUTE DAILY IN THE AFTERNOON. USE AS INSTRUCTED 07/02/23   Shamleffer, Donell Cardinal, MD  predniSONE  (DELTASONE ) 5 MG tablet Take 5 mg by mouth daily with breakfast.    [provider]  tacrolimus  (PROGRAF ) 1 MG capsule  Take 4 mg by mouth See admin instructions. Take 4 mg by mouth at 9 AM and 4 mg at 9 PM    [provider]  Tiotropium Bromide  Monohydrate 1.25 MCG/ACT AERS Inhale 1 puff into the lungs in the morning and at bedtime. 10/23/17   [provider]  torsemide  (DEMADEX ) 20 MG tablet Take 1 tablet (20 mg total) by mouth daily. 04/23/24 07/22/24  Arlice Reichert, MD    Allergies: Sodium hypochlorite, Lactose, Asa arthritis strength-antacid [aspirin  buffered], Aspirin , Atorvastatin, Banana, Lactose intolerance (gi), and Lisinopril    Review of Systems  Updated Vital Signs BP 123/66   Pulse 81   Temp 97.8 F (36.6 C)   Resp (!) 21   Ht 5' 2 (1.575 m)   Wt 54.4 kg   LMP  03/04/2022   SpO2 100%   BMI 21.95 kg/m   Physical Exam Constitutional:      General: She is not in acute distress. HENT:     Head: Normocephalic and atraumatic.  Eyes:     Conjunctiva/sclera: Conjunctivae normal.     Pupils: Pupils are equal, round, and reactive to light.  Cardiovascular:     Rate and Rhythm: Normal rate and regular rhythm.  Pulmonary:     Effort: Pulmonary effort is normal. No respiratory distress.     Comments: Wheezing, rhonchi bilaterally, on bipap on arrival Abdominal:     General: There is no distension.     Tenderness: There is no abdominal tenderness.  Skin:    General: Skin is warm and dry.  Neurological:     General: No focal deficit present.     Mental Status: She is alert. Mental status is at baseline.  Psychiatric:        Mood and Affect: Mood normal.        Behavior: Behavior normal.     (all labs ordered are listed, but only abnormal results are displayed) Labs Reviewed  CBC WITH DIFFERENTIAL/PLATELET - Abnormal; Notable for the following components:      Result Value   WBC 12.3 (*)    Hemoglobin 11.9 (*)    HCT 33.2 (*)    MCV 73.8 (*)    RDW 19.8 (*)    Platelets 131 (*)    Neutro Abs 9.7 (*)    Abs Immature Granulocytes 0.12 (*)    All other components within normal limits  BRAIN NATRIURETIC PEPTIDE - Abnormal; Notable for the following components:   B Natriuretic Peptide 1,819.1 (*)    All other components within normal limits  BASIC METABOLIC PANEL WITH GFR - Abnormal; Notable for the following components:   Glucose, Bld 213 (*)    BUN 44 (*)    Creatinine, Ser 2.61 (*)    Calcium  8.7 (*)    GFR, Estimated 18 (*)    All other components within normal limits  I-STAT VENOUS BLOOD GAS, ED - Abnormal; Notable for the following components:   pCO2, Ven 40.7 (*)    pO2, Ven 180 (*)    Sodium 133 (*)    Potassium 6.9 (*)    Calcium , Ion 1.03 (*)    HCT 49.0 (*)    Hemoglobin 16.7 (*)    All other components within normal  limits  RESP PANEL BY RT-PCR (RSV, FLU A&B, COVID)  [id number]    EKG: EKG Interpretation Date/Time:  Saturday May 24 2024 13:27:34 EDT Ventricular Rate:  80 PR Interval:  163 QRS Duration:  92 QT Interval:  430 QTC  Calculation: 497 R Axis:   12  Text Interpretation: Sinus rhythm Abnormal R-wave progression, early transition Borderline prolonged QT interval Confirmed by Cottie Cough [id number]) on 05/24/2024 1:35:13 PM  Radiology: [facility] Chest Portable 1 View Result Date: 05/24/2024 CLINICAL DATA:  Shortness of breath. EXAM: PORTABLE CHEST 1 VIEW COMPARISON:  Radiograph 04/20/2024 and 04/17/2024.  CT 04/19/2024. FINDINGS: 1336 hours. 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 There is mild cardiomegaly and aortic atherosclerosis. There are new patchy, basilar predominant airspace opacities in both lungs without consolidation. No significant pleural effusion or pneumothorax. No acute osseous findings are evident. The upper sternotomy wires are chronically fractured. IMPRESSION: New patchy, basilar predominant airspace opacities in both lungs, suspicious for edema superimposed on emphysema or multifocal bronchopneumonia. Radiographic follow-up recommended. Electronically Signed   By: Elsie Perone M.D.   On: 05/24/2024 13:54     .Critical Care  Performed by: Cottie Cough PARAS, MD Authorized by: Cottie Cough PARAS, MD   Critical care provider statement:    Critical care time (minutes):  45   Critical care time was exclusive of:  Separately billable procedures and treating other patients   Critical care was necessary to treat or prevent imminent or life-threatening deterioration of the following conditions:  Respiratory failure   Critical care was time spent personally by me on the following activities:  Ordering and performing treatments and interventions, ordering and review of laboratory studies, ordering and review of radiographic studies, pulse  oximetry, review of old charts, examination of patient and evaluation of patient's response to treatment Comments:     Bipap SOB    Medications Ordered in the ED  cefTRIAXone  (ROCEPHIN ) 1 g in sodium chloride  0.9 % 100 mL IVPB (1 g Intravenous New Bag/Given 05/24/24 1457)  doxycycline  (VIBRAMYCIN ) 100 mg in sodium chloride  0.9 % 250 mL IVPB (has no administration in time range)  furosemide  (LASIX ) injection 40 mg (has no administration in time range)  albuterol  (PROVENTIL ) (2.5 MG/3ML) 0.083% nebulizer solution 10 mg (10 mg Nebulization Given 05/24/24 1448)                                    Medical Decision Making Amount and/or Complexity of Data Reviewed Labs: ordered. Radiology: ordered.  Risk Prescription drug management. Decision regarding hospitalization.   This patient presents to the ED with concern for shortness of breath. This involves an extensive number of treatment options, and is a complaint that carries with it a high risk of complications and morbidity.  The differential diagnosis includes COPD vs CHF vs PNA vs pleural effusion vs other  Co-morbidities that complicate the patient evaluation: CHF and COPD  Additional history obtained from EMS  External records from outside source obtained and reviewed including hosp discharge summary  I ordered and personally interpreted labs.  The pertinent results include:  WBC 12.3.  BNP elevated 1819.  Cr 2.6 (slightly elevated over baseline).  VBG with no acidosis, pH 7.4  I ordered imaging studies including dg chest I independently visualized and interpreted imaging which showed pulm edema, possible PNA I agree with the radiologist interpretation  The patient was maintained on a cardiac monitor.  I personally viewed and interpreted the cardiac monitored which showed an underlying rhythm of: NSR and sinus tachycardia  Per my interpretation the patient's ECG shows no acute ischemia  I ordered medication including IV lasix  for  CHF  exacerbation, nebulizer and bipap for COPD /hypoxia, IV antibiotics for possible CAP    I have reviewed the patients home medicines and have made adjustments as needed  Test Considered: lower suspicion for acute PE, ACS  After the interventions noted above, I reevaluated the patient and found that they have: improved   Disposition:  After consideration of the diagnostic results and the patients response to treatment, I feel that the patent would benefit from admission.  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  Also may be infectious etiology/PNA per xray, but lower suspicion for sepsis.      Final diagnoses:  Congestive heart failure, unspecified HF chronicity, unspecified heart failure type (HCC)  COPD exacerbation (HCC)  Pneumonia due to infectious organism, unspecified laterality, unspecified part of lung  Hypoxia    ED Discharge Orders     None          Cottie Donnice PARAS, MD 05/24/24 1527

## 2024-05-24 NOTE — ED Notes (Signed)
 Extra light green top sent down to lab , lab notified.

## 2024-05-25 ENCOUNTER — Encounter (HOSPITAL_COMMUNITY): Payer: Self-pay | Admitting: Pulmonary Disease

## 2024-05-25 ENCOUNTER — Other Ambulatory Visit: Payer: Self-pay

## 2024-05-25 ENCOUNTER — Inpatient Hospital Stay (HOSPITAL_COMMUNITY)

## 2024-05-25 DIAGNOSIS — N1831 Chronic kidney disease, stage 3a: Secondary | ICD-10-CM | POA: Diagnosis not present

## 2024-05-25 DIAGNOSIS — J1282 Pneumonia due to coronavirus disease 2019: Secondary | ICD-10-CM | POA: Diagnosis not present

## 2024-05-25 DIAGNOSIS — I509 Heart failure, unspecified: Secondary | ICD-10-CM

## 2024-05-25 DIAGNOSIS — U071 COVID-19: Secondary | ICD-10-CM | POA: Diagnosis not present

## 2024-05-25 DIAGNOSIS — J9621 Acute and chronic respiratory failure with hypoxia: Secondary | ICD-10-CM | POA: Diagnosis not present

## 2024-05-25 LAB — CBC WITH DIFFERENTIAL/PLATELET
Abs Immature Granulocytes: 0.08 K/uL — ABNORMAL HIGH (ref 0.00–0.07)
Basophils Absolute: 0 K/uL (ref 0.0–0.1)
Basophils Relative: 0 %
Eosinophils Absolute: 0 K/uL (ref 0.0–0.5)
Eosinophils Relative: 0 %
HCT: 32.3 % — ABNORMAL LOW (ref 36.0–46.0)
Hemoglobin: 11.2 g/dL — ABNORMAL LOW (ref 12.0–15.0)
Immature Granulocytes: 1 %
Lymphocytes Relative: 17 %
Lymphs Abs: 1.1 K/uL (ref 0.7–4.0)
MCH: 25.2 pg — ABNORMAL LOW (ref 26.0–34.0)
MCHC: 34.7 g/dL (ref 30.0–36.0)
MCV: 72.6 fL — ABNORMAL LOW (ref 80.0–100.0)
Monocytes Absolute: 0.4 K/uL (ref 0.1–1.0)
Monocytes Relative: 6 %
Neutro Abs: 5.1 K/uL (ref 1.7–7.7)
Neutrophils Relative %: 76 %
Platelets: 126 K/uL — ABNORMAL LOW (ref 150–400)
RBC: 4.45 MIL/uL (ref 3.87–5.11)
RDW: 18.9 % — ABNORMAL HIGH (ref 11.5–15.5)
Smear Review: NORMAL
WBC: 6.7 K/uL (ref 4.0–10.5)
nRBC: 0.3 % — ABNORMAL HIGH (ref 0.0–0.2)

## 2024-05-25 LAB — RENAL FUNCTION PANEL
Albumin: 2.5 g/dL — ABNORMAL LOW (ref 3.5–5.0)
Anion gap: 14 (ref 5–15)
BUN: 49 mg/dL — ABNORMAL HIGH (ref 8–23)
CO2: 21 mmol/L — ABNORMAL LOW (ref 22–32)
Calcium: 8.4 mg/dL — ABNORMAL LOW (ref 8.9–10.3)
Chloride: 107 mmol/L (ref 98–111)
Creatinine, Ser: 2.71 mg/dL — ABNORMAL HIGH (ref 0.44–1.00)
GFR, Estimated: 17 mL/min — ABNORMAL LOW (ref >60)
Glucose, Bld: 149 mg/dL — ABNORMAL HIGH (ref 70–99)
Phosphorus: 4.1 mg/dL (ref 2.5–4.6)
Potassium: 4.3 mmol/L (ref 3.5–5.1)
Sodium: 142 mmol/L (ref 135–145)

## 2024-05-25 LAB — GLUCOSE, CAPILLARY
Glucose-Capillary: 106 mg/dL — ABNORMAL HIGH (ref 70–99)
Glucose-Capillary: 126 mg/dL — ABNORMAL HIGH (ref 70–99)
Glucose-Capillary: 173 mg/dL — ABNORMAL HIGH (ref 70–99)
Glucose-Capillary: 219 mg/dL — ABNORMAL HIGH (ref 70–99)
Glucose-Capillary: 234 mg/dL — ABNORMAL HIGH (ref 70–99)
Glucose-Capillary: 252 mg/dL — ABNORMAL HIGH (ref 70–99)
Glucose-Capillary: 313 mg/dL — ABNORMAL HIGH (ref 70–99)
Glucose-Capillary: 60 mg/dL — ABNORMAL LOW (ref 70–99)
Glucose-Capillary: 95 mg/dL (ref 70–99)
Glucose-Capillary: 97 mg/dL (ref 70–99)

## 2024-05-25 LAB — HEMOGLOBIN A1C
Hgb A1c MFr Bld: 6.8 % — ABNORMAL HIGH (ref 4.8–5.6)
Mean Plasma Glucose: 148.46 mg/dL

## 2024-05-25 LAB — MAGNESIUM: Magnesium: 2.4 mg/dL (ref 1.7–2.4)

## 2024-05-25 LAB — LACTIC ACID, PLASMA: Lactic Acid, Venous: 2.9 mmol/L (ref 0.5–1.9)

## 2024-05-25 MED ORDER — NONFORMULARY OR COMPOUNDED ITEM
0.5000 | Freq: Every day | Status: DC
  Filled 2024-05-25 (×2): qty 1

## 2024-05-25 MED ORDER — DEXTROSE 50 % IV SOLN
12.5000 g | INTRAVENOUS | Status: AC
Start: 2024-05-25 — End: 2024-05-25
  Administered 2024-05-25: 12.5 g via INTRAVENOUS
  Filled 2024-05-25: qty 50

## 2024-05-25 MED ORDER — TACROLIMUS 1 MG PO CAPS
4.0000 mg | ORAL_CAPSULE | Freq: Two times a day (BID) | ORAL | Status: DC
  Administered 2024-05-25 – 2024-06-09 (×29): 4 mg via ORAL
  Filled 2024-05-25 (×32): qty 4

## 2024-05-25 MED ORDER — INSULIN ASPART 100 UNIT/ML IJ SOLN
3.0000 [IU] | INTRAMUSCULAR | Status: DC
  Administered 2024-05-25: 9 [IU] via SUBCUTANEOUS
  Administered 2024-05-25: 6 [IU] via SUBCUTANEOUS
  Administered 2024-05-25: 9 [IU] via SUBCUTANEOUS
  Administered 2024-05-26: 6 [IU] via SUBCUTANEOUS
  Administered 2024-05-26 – 2024-05-28 (×4): 3 [IU] via SUBCUTANEOUS
  Administered 2024-05-29: 9 [IU] via SUBCUTANEOUS
  Administered 2024-05-30: 6 [IU] via SUBCUTANEOUS

## 2024-05-25 MED ORDER — NONFORMULARY OR COMPOUNDED ITEM
0.5000 | Freq: Once | Status: AC
  Administered 2024-05-25: 0.5 via ORAL
  Filled 2024-05-25 (×2): qty 1

## 2024-05-25 MED ORDER — NON FORMULARY: Freq: Every day | Status: DC

## 2024-05-25 MED ORDER — BARICITINIB 2 MG PO TABS
1.0000 mg | ORAL_TABLET | Freq: Every day | ORAL | Status: AC
  Administered 2024-05-26 – 2024-06-07 (×13): 1 mg via ORAL
  Filled 2024-05-25 (×13): qty 0.5

## 2024-05-25 MED ORDER — INSULIN ASPART 100 UNIT/ML IJ SOLN
8.0000 [IU] | Freq: Once | INTRAMUSCULAR | Status: AC
  Administered 2024-05-25: 8 [IU] via SUBCUTANEOUS

## 2024-05-25 MED ORDER — SODIUM CHLORIDE 0.9 % IV SOLN: INTRAVENOUS | Status: DC

## 2024-05-25 MED ORDER — BARICITINIB 2 MG PO TABS: 4.0000 mg | ORAL_TABLET | Freq: Every day | ORAL | Status: DC

## 2024-05-25 MED ORDER — PIPERACILLIN-TAZOBACTAM IN DEX 2-0.25 GM/50ML IV SOLN
2.2500 g | Freq: Three times a day (TID) | INTRAVENOUS | Status: DC
  Administered 2024-05-26 – 2024-05-28 (×8): 2.25 g via INTRAVENOUS
  Filled 2024-05-25 (×10): qty 50

## 2024-05-25 MED ORDER — IPRATROPIUM-ALBUTEROL 0.5-2.5 (3) MG/3ML IN SOLN
3.0000 mL | RESPIRATORY_TRACT | Status: DC | PRN
  Administered 2024-05-25 – 2024-06-08 (×2): 3 mL via RESPIRATORY_TRACT
  Filled 2024-05-25 (×2): qty 3

## 2024-05-25 MED ORDER — NONFORMULARY OR COMPOUNDED ITEM
0.5000 | Freq: Every day | Status: DC
  Filled 2024-05-25: qty 1

## 2024-05-25 MED ORDER — INSULIN GLARGINE-YFGN 100 UNIT/ML ~~LOC~~ SOLN
0.1500 [IU]/kg | Freq: Every day | SUBCUTANEOUS | Status: DC
  Administered 2024-05-25 – 2024-05-28 (×4): 8 [IU] via SUBCUTANEOUS
  Filled 2024-05-25 (×5): qty 0.08

## 2024-05-25 NOTE — Consult Note (Signed)
 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 No f/c/r, LL edema, CP, N/V, abd pain, rash, melena, or blood per rectum. Given Lasix  120 mg, Solumedrol 125 mg, Zosyn , and Linezolid . Reportedly oxygen  saturation was around 55% on 4 L, treated with a DuoNeb with improvement of saturation to 83% on NRM. On BiPAP 08/25/59%, RR 15, saturating at 95%. COVID positive.   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 On BiPAP 08/25/59%, RR 15, saturating at 95%. 05/24/2024: will admit to ICU  Interim History / Subjective:  No acute events overnight, creatinine slightly better.  Really unchanged.  Remains on BiPAP  Objective    Blood pressure 131/74, pulse 92, temperature 98.1 F (36.7 C), temperature source Axillary, resp. rate (!) 32, height 5' 2 (1.575 m), weight 51.7 kg, last menstrual period 03/04/2022, SpO2 (!) 89%.    Vent Mode: PCV FiO2 (%):  [40 %-100 %] 50 % Set Rate:  [15  bmp] 15 bmp PEEP:  [5 cmH20] 5 cmH20 Pressure Support:  [6 cmH20] 6 cmH20   Intake/Output Summary (Last 24 hours) at 05/25/2024 1442 Last data filed at 05/25/2024 1303 Gross per 24 hour  Intake 1845.73 ml  Output 250 ml  Net 1595.73 ml   Filed Weights   05/24/24 1326 05/25/24 0945  Weight: 54.4 kg 51.7 kg    Examination: General: Ill-appearing HENT: Atraumatic normocephalic BiPAP in place Lungs: Normal work of breathing, mild tachypnea on BiPAP Cardiovascular: Borderline tachycardic, regular rate and rhythm Abdomen: Nondistended Neuro: nonfocal    Resolved problem list   Assessment and Plan  Acute on chronic hypoxic respiratory failure due to COVID PNA -BiPAP, nasal cannula as tolerated -Isolation: airborne and contact  -Renally dose baricitinib , dexamethasone  for COVID treatment -Bronchodilators -FiO2 for SpO2 >92%, wean as tolerated -s/p Zosyn  and Linezolid  - Follow-up respiratory and blood Cx   AKI/CKD-3a: dehydration Hx of kidney transplant in 2012 -renal U/S reassuring -nephrology consult, appreciate expertise and assistance with management  Lactic acidosis due to dehydration and sepsis  -IVF  HTN -Hold antihypertensive medication -Monitor BP   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 -Resume home meds  CAD/CABG, D-CHF (EF 60% with 2 G2DD) -Hold home meds due to borderline BP  DM-2 -Glycemic control -HbA1c  GERD -H2B  Chronic thrombocytopenia -Monitor   Best Practice (right click and Reselect all SmartList Selections daily)   Diet/type: NPO w/ oral meds DVT prophylaxis prophylactic heparin   Pressure ulcer(s): N/A GI prophylaxis: H2B Lines: N/A Foley:  N/A Code Status:  full code Last date of multidisciplinary goals of care  discussion []   Labs   CBC: Recent Labs  Lab 05/24/24 1331 05/24/24 1453 05/25/24 0550  WBC 12.3*  --  6.7  NEUTROABS 9.7*  --  5.1  HGB 11.9* 16.7* 11.2*  HCT 33.2* 49.0* 32.3*  MCV 73.8*  --  72.6*  PLT 131*  --   126*    Basic Metabolic Panel: Recent Labs  Lab 05/24/24 1436 05/24/24 1453 05/24/24 1800 05/25/24 0550  NA 138 133* 138 142  K 4.8 6.9* 4.9 4.3  CL 102  --  102 107  CO2 22  --  16* 21*  GLUCOSE 213*  --  327* 149*  BUN 44*  --  46* 49*  CREATININE 2.61*  --  2.94* 2.71*  CALCIUM  8.7*  --  8.9 8.4*  MG  --   --  2.7* 2.4  PHOS  --   --  4.3 4.1   GFR: Estimated Creatinine Clearance: 12.9 mL/min (A) (by C-G formula based on SCr of 2.71 mg/dL (H)). Recent Labs  Lab 05/24/24 1331 05/24/24 1814 05/25/24 0550  WBC 12.3*  --  6.7  LATICACIDVEN  --  8.0* 2.9*    Liver Function Tests: Recent Labs  Lab 05/24/24 1800 05/25/24 0550  AST 31  --   ALT 13  --   ALKPHOS 47  --   BILITOT 0.7  --   PROT 6.0*  --   ALBUMIN 2.6*  2.6* 2.5*   No results for input(s): LIPASE, AMYLASE in the last 168 hours. No results for input(s): AMMONIA in the last 168 hours.  ABG    Component Value Date/Time   PHART 7.406 (H) 10/16/2011 1057   PCO2ART 36.2 10/16/2011 1057   PO2ART 102.0 (H) 10/16/2011 1057   HCO3 18.3 (L) 05/24/2024 2100   TCO2 26 05/24/2024 1453   ACIDBASEDEF 7.9 (H) 05/24/2024 2100   O2SAT 78.2 05/24/2024 2100     Coagulation Profile: No results for input(s): INR, PROTIME in the last 168 hours.  Cardiac Enzymes: No results for input(s): CKTOTAL, CKMB, CKMBINDEX, TROPONINI in the last 168 hours.  HbA1C: Hemoglobin A1C  Date/Time Value Ref Range Status  10/24/2023 10:31 AM 6.1 (A) 4.0 - 5.6 % Final  01/01/2023 10:01 AM 6.0 (A) 4.0 - 5.6 % Final   Hgb A1c MFr Bld  Date/Time Value Ref Range Status  05/25/2024 05:50 AM 6.8 (H) 4.8 - 5.6 % Final    Comment:    (NOTE) Diagnosis of Diabetes The following HbA1c ranges recommended by the American Diabetes Association (ADA) may be used as an aid in the diagnosis of diabetes mellitus.  Hemoglobin             Suggested A1C NGSP%              Diagnosis  <5.7                   Non  Diabetic  5.7-6.4                Pre-Diabetic  >6.4                   Diabetic  <7.0                   Glycemic control for                       adults with diabetes.    02/13/2018 03:05 AM 5.7 (H) 4.8 - 5.6 % Final  Comment:    (NOTE) Pre diabetes:          5.7%-6.4% Diabetes:              >6.4% Glycemic control for   <7.0% adults with diabetes     CBG: Recent Labs  Lab 05/25/24 0325 05/25/24 0603 05/25/24 0729 05/25/24 1108 05/25/24 1243  GLUCAP 252* 126* 97 234* 219*    Review of Systems:   N/a   Past Medical History:  She,  has a past medical history of Anemia, Asthma, Cellulitis (12/19/2019), Coronary artery disease, Diabetes mellitus, ESRD on dialysis [facility]), Family history of breast cancer, Family history of prostate cancer, GERD (gastroesophageal reflux disease), GI bleed (11/26/2010), Gout, History of methicillin resistant staphylococcus aureus (MRSA), Hyperlipidemia, Hyperparathyroidism, Hypertension, Myocardial infarction (HCC), Osteoporosis, Pneumonia (08/2010 ), Primary osteoarthritis, left shoulder (08/01/2016), S/P kidney transplant, and Subacromial impingement of left shoulder (08/01/2016).   Surgical History:   Past Surgical History:  Procedure Laterality Date   ABDOMINAL HYSTERECTOMY  1980'S   ARTERIOVENOUS GRAFT PLACEMENT Left 03/04/2007   forearm   BIOPSY  09/30/2019   Procedure: BIOPSY;  Surgeon: Dianna Specking, MD;  Location: [facility];  Service: Endoscopy;;   BIOPSY  12/27/2021   Procedure: BIOPSY;  Surgeon: Dianna Specking, MD;  Location: [facility];  Service: Endoscopy;;   BREAST EXCISIONAL BIOPSY Right    benign more than 10 yr ago   BREAST SURGERY  YRS AGO   RT BREAST CYST REMOVED    CARDIAC CATHETERIZATION  06/03/2002; 12/04/2007   COLONOSCOPY WITH PROPOFOL  N/A 07/14/2014   Procedure: COLONOSCOPY WITH PROPOFOL ;  Surgeon: Specking KYM Dianna, MD;  Location: [facility];  Service: Endoscopy;  Laterality: N/A;   COLONOSCOPY WITH  PROPOFOL  N/A 09/30/2019   Procedure: COLONOSCOPY WITH PROPOFOL ;  Surgeon: Dianna Specking, MD;  Location: [facility];  Service: Endoscopy;  Laterality: N/A;   COLONOSCOPY WITH PROPOFOL  N/A 12/27/2021   Procedure: COLONOSCOPY WITH PROPOFOL ;  Surgeon: Dianna Specking, MD;  Location: [facility];  Service: Endoscopy;  Laterality: N/A;   CORONARY ARTERY BYPASS GRAFT  06/06/2002   x 4   ESOPHAGOGASTRODUODENOSCOPY N/A 12/27/2021   Procedure: ESOPHAGOGASTRODUODENOSCOPY (EGD);  Surgeon: Dianna Specking, MD;  Location: [facility];  Service: Endoscopy;  Laterality: N/A;   HEMOSTASIS CLIP PLACEMENT  12/27/2021   Procedure: HEMOSTASIS CLIP PLACEMENT;  Surgeon: Dianna Specking, MD;  Location: [facility];  Service: Endoscopy;;   HOT HEMOSTASIS N/A 07/14/2014   Procedure: HOT HEMOSTASIS (ARGON PLASMA COAGULATION/BICAP);  Surgeon: Specking KYM Dianna, MD;  Location: [facility];  Service: Endoscopy;  Laterality: N/A;   IR FLUORO GUIDE CV LINE RIGHT  12/22/2019   IR US  GUIDE VASC ACCESS RIGHT  12/22/2019   KIDNEY TRANSPLANT Right 08/04/2011   ORIF PATELLA Left 04/06/2016   Procedure: OPEN REDUCTION INTERNAL (ORIF) LEFT  PATELLA;  Surgeon: Toribio JULIANNA Chancy, MD;  Location: [facility];  Service: Orthopedics;  Laterality: Left;   POLYPECTOMY  09/30/2019   Procedure: POLYPECTOMY;  Surgeon: Dianna Specking, MD;  Location: [facility];  Service: Endoscopy;;   POLYPECTOMY  12/27/2021   Procedure: POLYPECTOMY;  Surgeon: Dianna Specking, MD;  Location: [facility];  Service: Endoscopy;;   SHOULDER ARTHROSCOPY WITH ROTATOR CUFF REPAIR AND SUBACROMIAL DECOMPRESSION Left 08/03/2016   Procedure: LEFT SHOULDER ARTHROSCOPY WITH ROTATOR CUFF REPAIR AND SUBACROMIAL DECOMPRESSION with distal claviculectomy and extentsive debridement;  Surgeon: Toribio JULIANNA Chancy, MD;  Location: [facility];  Service: Orthopedics;  Laterality: Left;  Block   THROMBECTOMY AND REVISION OF ARTERIOVENTOUS (AV)  GORETEX   GRAFT Left 05/08/2007   forearm   TOTAL HIP ARTHROPLASTY  12/18/2012   Procedure: TOTAL HIP ARTHROPLASTY;  Surgeon: Toribio JULIANNA Chancy, MD;  Location: [facility];  Service: Orthopedics;  Laterality: Left;   VESICOVAGINAL FISTULA CLOSURE W/ TAH  1984     Social History:   reports that she quit smoking about 40 years ago. Her smoking use included cigarettes. She started smoking about 48 years ago. She has a 8 pack-year smoking history. She has never used smokeless tobacco. She reports that she does not drink alcohol and does not use drugs.   Family History:  Her family history includes Lung cancer in her brother; Prostate cancer in her brother. There is no history of Cancer.   Allergies Allergies  Allergen Reactions   Sodium Hypochlorite Shortness Of Breath   Lactose Nausea And Vomiting   Asa Arthritis Strength-Antacid [Aspirin  Buffered] Nausea And Vomiting and Other (See Comments)    STOMACH BURNS, also   Aspirin  Nausea And Vomiting    Per pt. can tolerate the enteric coated tablets.    Atorvastatin Other (See Comments)    Patient's skin was skin was sensitive ALL NSAIDS   Banana Nausea And Vomiting   Lactose Intolerance (Gi) Nausea And Vomiting   Lisinopril Cough     Home Medications  Prior to Admission medications   Medication Sig Start Date End Date Taking? Authorizing Provider  acetaminophen  (TYLENOL ) 650 MG CR tablet Take 650 mg by mouth daily as needed for pain.   Yes [provider]  albuterol  (PROVENTIL ) (2.5 MG/3ML) 0.083% nebulizer solution Take 2.5 mg by nebulization every 6 (six) hours as needed for wheezing or shortness of breath.   Yes [provider]  albuterol  (VENTOLIN  HFA) 108 (90 Base) MCG/ACT inhaler Inhale 2 puffs into the lungs every 6 (six) hours as needed for wheezing or shortness of breath.   Yes [provider]  amLODipine  (NORVASC ) 10 MG tablet Take 10 mg by mouth daily.    Yes [provider]  aspirin  EC 81 MG tablet Take 81  mg by mouth daily.   Yes [provider]  budesonide  (PULMICORT ) 0.25 MG/2ML nebulizer solution Inhale 0.25 mg into the lungs in the morning, at noon, in the evening, and at bedtime. 02/11/20  Yes [provider]  calcitRIOL  (ROCALTROL ) 0.25 MCG capsule Take 0.25 mcg by mouth daily.    Yes [provider]  ezetimibe  (ZETIA ) 10 MG tablet TAKE 1 TABLET BY MOUTH EVERY DAY 01/16/24  Yes Nahser, Aleene PARAS, MD  fluticasone  (FLONASE ) 50 MCG/ACT nasal spray Place 1 spray into both nostrils in the morning, at noon, and at bedtime. 05/30/21  Yes [provider]  Fluticasone -Umeclidin-Vilant (TRELEGY ELLIPTA) 200-62.5-25 MCG/ACT AEPB Inhale 1 puff into the lungs in the morning, at noon, and at bedtime.   Yes [provider]  isosorbide  mononitrate (IMDUR ) 30 MG 24 hr tablet Take 1 tablet (30 mg total) by mouth daily. 04/15/24  Yes Nahser, Aleene PARAS, MD  KLOR-CON  M20 20 MEQ tablet TAKE 1 TABLET BY MOUTH EVERY DAY 11/22/23  Yes Nahser, Aleene PARAS, MD  metoprolol  tartrate (LOPRESSOR ) 50 MG tablet TAKE 1 TABLET BY MOUTH TWICE A DAY 07/24/23  Yes Nahser, Aleene PARAS, MD  nitroGLYCERIN  (NITROSTAT ) 0.4 MG SL tablet Place 1 tablet (0.4 mg total) under the tongue every 5 (five) minutes as needed for chest pain. 10/03/22  Yes Nahser, Aleene PARAS, MD  omeprazole (PRILOSEC) 20 MG capsule Take 20 mg by mouth  daily after breakfast.   Yes [provider]  predniSONE  (DELTASONE ) 5 MG tablet Take 5 mg by mouth daily with breakfast.   Yes [provider]  tacrolimus  (PROGRAF ) 1 MG capsule Take 4 mg by mouth See admin instructions. Take 4 mg by mouth at 9 AM and 4 mg at 9 PM   Yes [provider]  Tiotropium Bromide  Monohydrate 1.25 MCG/ACT AERS Inhale 1 puff into the lungs in the morning and at bedtime. 10/23/17  Yes [provider]  torsemide  (DEMADEX ) 20 MG tablet Take 1 tablet (20 mg total) by mouth daily. 04/23/24 07/22/24 Yes Dahal, Chapman, MD  Azelastine  HCl 0.15 %  SOLN Place 1 spray into both nostrils daily as needed for allergies. Patient not taking: Reported on 05/24/2024    [provider]  ONETOUCH VERIO test strip 1 EACH BY OTHER ROUTE DAILY IN THE AFTERNOON. USE AS INSTRUCTED 07/02/23   Shamleffer, Donell Cardinal, MD     Critical care time:     CRITICAL CARE Performed by: Donnice JONELLE Beals   Total critical care time: 33 minutes  Critical care time was exclusive of separately billable procedures and treating other patients.  Critical care was necessary to treat or prevent imminent or life-threatening deterioration.  Critical care was time spent personally by me on the following activities: development of treatment plan with patient and/or surrogate as well as nursing, discussions with consultants, evaluation of patient's response to treatment, examination of patient, obtaining history from patient or surrogate, ordering and performing treatments and interventions, ordering and review of laboratory studies, ordering and review of radiographic studies, pulse oximetry and re-evaluation of patient's condition.    Donnice JONELLE Beals, MD [facility] Pager: see AMION

## 2024-05-25 NOTE — Progress Notes (Signed)
 eLink Physician-Brief Progress Note Patient Name: [name] DOB: 1941-10-02 MRN: [id number]   Date of Service  05/25/2024  HPI/Events of Note  Patient is on a low-dose insulin  sliding scale and blood glucose is greater than 300.  She is not on long-acting insulin .  eICU Interventions  Will give a one-time dose of 8 units of insulin  aspart for now and change sliding scale to a higher dose.  Will also start patient on Lantus  daily for better glucose control.  Discussed with RN.     Intervention Category Intermediate Interventions: Hyperglycemia - evaluation and treatment  Jerilynn Berg 05/25/2024, 1:34 AM

## 2024-05-25 NOTE — Progress Notes (Signed)
 eLink Physician-Brief Progress Note Patient Name: [name] DOB: 03/28/1941 MRN: [id number]   Date of Service  05/25/2024  HPI/Events of Note  83 year old female with multiple medical problems including end-stage renal failure status post renal transplant in 2012, COPD and chronic respiratory failure on home oxygen  who presented to the emergency department with cough, wheezing and shortness of breath.  Found to have acute on chronic respiratory failure requiring noninvasive ventilation.  She is COVID-positive.  eICU Interventions  Patient's chart reviewed.  Pertinent labs, imaging and diagnostic studies reviewed.  Audiovisual assessment of patient done.  She is audibly wheezing and complains of throat pain.  She is being treated with remdesivir  and dexamethasone  for COVID pneumonia.  She is also on broad-spectrum antibiotics.  Noninvasive mechanical ventilation for hypoxemia.  Inhaled bronchodilators for wheezing.  She will need a nephrology eval for her chronic kidney disease status post renal transplant.     Intervention Category Evaluation Type: New Patient Evaluation  Jerilynn Berg 05/25/2024, 6:07 AM

## 2024-05-25 NOTE — Plan of Care (Signed)
  Problem: Education: Goal: Knowledge of risk factors and measures for prevention of condition will improve Outcome: Not Progressing   Problem: Coping: Goal: Psychosocial and spiritual needs will be supported Outcome: Not Progressing   Problem: Respiratory: Goal: Will maintain a patent airway Outcome: Not Progressing Goal: Complications related to the disease process, condition or treatment will be avoided or minimized Outcome: Not Progressing   Problem: Education: Goal: Knowledge of General Education information will improve Description: Including pain rating scale, medication(s)/side effects and non-pharmacologic comfort measures Outcome: Not Progressing   Problem: Health Behavior/Discharge Planning: Goal: Ability to manage health-related needs will improve Outcome: Not Progressing   Problem: Clinical Measurements: Goal: Ability to maintain clinical measurements within normal limits will improve Outcome: Not Progressing Goal: Will remain free from infection Outcome: Not Progressing Goal: Diagnostic test results will improve Outcome: Not Progressing Goal: Respiratory complications will improve Outcome: Not Progressing Goal: Cardiovascular complication will be avoided Outcome: Not Progressing   Problem: Activity: Goal: Risk for activity intolerance will decrease Outcome: Not Progressing   Problem: Nutrition: Goal: Adequate nutrition will be maintained Outcome: Not Progressing   Problem: Coping: Goal: Level of anxiety will decrease Outcome: Not Progressing   Problem: Elimination: Goal: Will not experience complications related to bowel motility Outcome: Not Progressing Goal: Will not experience complications related to urinary retention Outcome: Not Progressing   Problem: Pain Managment: Goal: General experience of comfort will improve and/or be controlled Outcome: Not Progressing   Problem: Safety: Goal: Ability to remain free from injury will  improve Outcome: Not Progressing   Problem: Skin Integrity: Goal: Risk for impaired skin integrity will decrease Outcome: Not Progressing

## 2024-05-25 NOTE — Progress Notes (Signed)
 0730 patient on BIPAP able to make all needs known 0800 oral care completed with Ice chips patient 02 level dropped to 74 patient able to transfer with min assist to [facility] as needed  1130 family at bedside updated on isolation precautions and plan of care

## 2024-05-25 NOTE — Progress Notes (Signed)
 PHARMACY NOTE:  ANTIMICROBIAL RENAL DOSAGE ADJUSTMENT  Current antimicrobial regimen includes a mismatch between antimicrobial dosage and estimated renal function.  As per policy approved by the Pharmacy & Therapeutics and Medical Executive Committees, the antimicrobial dosage will be adjusted accordingly.  Current antimicrobial dosage:  Zosyn  3.375gm IV q8h  Indication: PNA  Renal Function:  Estimated Creatinine Clearance: 12.9 mL/min (A) (by C-G formula based on SCr of 2.71 mg/dL (H)).    Antimicrobial dosage has been changed to:  Zosyn  2.25gm IV q8h  Thank you for allowing pharmacy to be a part of this patient's care.  Vito Ralph, PharmD, BCPS Please see amion for complete clinical pharmacist phone list 05/25/2024 2:37 PM

## 2024-05-25 NOTE — Progress Notes (Signed)
 Patient transported to 4N30 on BIPAP without any complications.

## 2024-05-25 NOTE — Progress Notes (Addendum)
 [name]  Assessment/ Plan: Pt is a 83 y.o. yo female   with past medical history significant for hypertension, HLD, ESRD status post deceased donor kidney transplant in 2012, COPD on home oxygen , CAD status post CABG, recent multiple hospitalization for shortness of breath/COPD presented now with worsening shortness of breath and hypoxia, seen as a consultation for the evaluation and management of AKI on CKD and transplant patient.   # # Acute on chronic respiratory failure with hypoxia due to COVID PNA: Received Lasix  in the ER.  Currently managing with Decadron , remdesivir , bronchodilators, broad-spectrum antibiotics and BiPAP.  Managed by pulmonary team.  # Acute kidney injury on CKD stage IIIa: Baseline creatinine level seems to be fluctuating anywhere between 1.2-1.7: AKI is likely due to ischemic ATN in the setting of sepsis and relative hypotension.  UA with chronic proteinuria, no active sediment.  Transplant kidney ultrasound unremarkable.  The patient is oliguric probably due to dehydration.  She has not been eating and currently NPO.  I will start gentle IV hydration. Strict ins and outs and daily lab.   # History of kidney transplant in 2012: Continue tacrolimus  and Decadron  because of COVID.  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   # History of hypertension: Hold antihypertensive medication.  Monitor BP. Discussed with  nurse.  Subjective: Seen and examined.  Patient is on BiPAP and NPO.  Still having difficulty with breathing.  Urine output is recorded only 100 cc. Objective Vital signs in last 24 hours: Vitals:   05/25/24 0945 05/25/24 1000 05/25/24 1100 05/25/24 1110  BP:  133/68 126/68   Pulse:  94 91 90  Resp:  (!) 23 19 (!) 22  Temp:    98.1 F (36.7 C)  TempSrc:    Axillary  SpO2:  95% 95% 93%  Weight: 51.7 kg     Height:       Weight change:   Intake/Output Summary (Last 24 hours) at  05/25/2024 1233 Last data filed at 05/25/2024 1100 Gross per 24 hour  Intake 1841.12 ml  Output 100 ml  Net 1741.12 ml       Labs: RENAL PANEL Recent Labs  Lab 05/24/24 1436 05/24/24 1453 05/24/24 1800 05/25/24 0550  NA 138 133* 138 142  K 4.8 6.9* 4.9 4.3  CL 102  --  102 107  CO2 22  --  16* 21*  GLUCOSE 213*  --  327* 149*  BUN 44*  --  46* 49*  CREATININE 2.61*  --  2.94* 2.71*  CALCIUM  8.7*  --  8.9 8.4*  MG  --   --  2.7* 2.4  PHOS  --   --  4.3 4.1  ALBUMIN  --   --  2.6*  2.6* 2.5*    Liver Function Tests: Recent Labs  Lab 05/24/24 1800 05/25/24 0550  AST 31  --   ALT 13  --   ALKPHOS 47  --   BILITOT 0.7  --   PROT 6.0*  --   ALBUMIN 2.6*  2.6* 2.5*   No results for input(s): LIPASE, AMYLASE in the last 168 hours. No results for input(s): AMMONIA in the last 168 hours. CBC: Recent Labs    04/21/24 0423 04/22/24 0455 05/24/24 1331 05/24/24 1453 05/25/24 0550  HGB 12.0 12.1 11.9* 16.7* 11.2*  MCV 72.5* 72.5* 73.8*  --  72.6*    Cardiac Enzymes: No results for input(s): CKTOTAL, CKMB, CKMBINDEX,  TROPONINI in the last 168 hours. CBG: Recent Labs  Lab 05/25/24 0005 05/25/24 0325 05/25/24 0603 05/25/24 0729 05/25/24 1108  GLUCAP 313* 252* 126* 97 234*    Iron Studies: No results for input(s): IRON, TIBC, TRANSFERRIN, FERRITIN in the last 72 hours. Studies/Results: US  Renal Transplant w/Doppler Result Date: 05/25/2024 CLINICAL DATA:  Evaluate renal graft.  Acute kidney injury. EXAM: ULTRASOUND OF RENAL TRANSPLANT WITH RENAL DOPPLER ULTRASOUND TECHNIQUE: Ultrasound examination of the renal transplant was performed with gray-scale, color and duplex doppler evaluation. COMPARISON:  08/07/2023 FINDINGS: Transplant kidney location: RLQ Transplant Kidney: Renal measurements: 9.9 x 4.7 x 4.6 cm = volume: 111.44mL. Normal in size and parenchymal echogenicity. No evidence of mass or hydronephrosis. No peri-transplant fluid collection  seen. Upper pole anechoic cyst measures 2.6 x 2.2 x 2.2 cm. Color flow in the main renal artery:  Yes Color flow in the main renal vein:  Yes Duplex Doppler Evaluation: Main Renal Artery Resistive Index: 0.86 Venous waveform in main renal vein:  present/absent Intrarenal resistive index in upper pole:  0.6 (normal 0.6-0.8; equivocal 0.8-0.9; abnormal >= 0.9) Intrarenal resistive index in lower pole: 0.7 (normal 0.6-0.8; equivocal 0.8-0.9; abnormal >= 0.9) Bladder: Not imaged. Other findings:  Exam detail diminished as patient was on BiPAP. IMPRESSION: 1. Normal sonographic appearance of the renal transplant. 2. No evidence for hydronephrosis or peritransplant fluid collection. 3. Patent renal vasculature. Electronically Signed   By: Waddell Calk M.D.   On: 05/25/2024 05:53   DG Chest Portable 1 View Result Date: 05/24/2024 CLINICAL DATA:  Shortness of breath. EXAM: PORTABLE CHEST 1 VIEW COMPARISON:  Radiograph 04/20/2024 and 04/17/2024.  CT 04/19/2024. FINDINGS: 1336 hours. 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 There is mild cardiomegaly and aortic atherosclerosis. There are new patchy, basilar predominant airspace opacities in both lungs without consolidation. No significant pleural effusion or pneumothorax. No acute osseous findings are evident. The upper sternotomy wires are chronically fractured. IMPRESSION: New patchy, basilar predominant airspace opacities in both lungs, suspicious for edema superimposed on emphysema or multifocal bronchopneumonia. Radiographic follow-up recommended. Electronically Signed   By: Elsie Perone M.D.   On: 05/24/2024 13:54    Medications: Infusions:  linezolid  (ZYVOX ) IV 300 mL/hr at 05/25/24 1100   piperacillin -tazobactam (ZOSYN )  IV Stopped (05/25/24 9041)   remdesivir  100 mg in sodium chloride  0.9 % 100 mL IVPB Stopped (05/25/24 1030)    Scheduled Medications:  arformoterol   15 mcg Nebulization BID    Chlorhexidine  Gluconate Cloth  6 each Topical Daily   dexamethasone  (DECADRON ) injection  6 mg Intravenous Q24H   ezetimibe   10 mg Oral Daily   famotidine   20 mg Oral QHS   guaiFENesin   1,200 mg Oral BID   heparin   5,000 Units Subcutaneous Q8H   insulin  aspart  3-9 Units Subcutaneous Q4H   insulin  glargine-yfgn  0.15 Units/kg Subcutaneous Daily   revefenacin   175 mcg Nebulization Daily   sodium chloride  flush  3 mL Intravenous Q12H    have reviewed scheduled and prn medications.  Physical Exam: General: On BiPAP, able to lie flat Heart:RRR, s1s2 nl Lungs: Coarse crackles bilateral. Abdomen:soft, Non-tender, non-distended Extremities:No edema Neurology alert awake and following commands:    [name] [name] 05/25/2024,12:33 PM  LOS: 1 day

## 2024-05-26 ENCOUNTER — Inpatient Hospital Stay (HOSPITAL_COMMUNITY)

## 2024-05-26 DIAGNOSIS — J9601 Acute respiratory failure with hypoxia: Secondary | ICD-10-CM | POA: Diagnosis not present

## 2024-05-26 DIAGNOSIS — U071 COVID-19: Secondary | ICD-10-CM | POA: Diagnosis not present

## 2024-05-26 DIAGNOSIS — N179 Acute kidney failure, unspecified: Secondary | ICD-10-CM | POA: Diagnosis not present

## 2024-05-26 DIAGNOSIS — J1282 Pneumonia due to coronavirus disease 2019: Secondary | ICD-10-CM | POA: Diagnosis not present

## 2024-05-26 LAB — BASIC METABOLIC PANEL WITH GFR
Anion gap: 18 — ABNORMAL HIGH (ref 5–15)
BUN: 44 mg/dL — ABNORMAL HIGH (ref 8–23)
CO2: 18 mmol/L — ABNORMAL LOW (ref 22–32)
Calcium: 8.5 mg/dL — ABNORMAL LOW (ref 8.9–10.3)
Chloride: 107 mmol/L (ref 98–111)
Creatinine, Ser: 1.99 mg/dL — ABNORMAL HIGH (ref 0.44–1.00)
GFR, Estimated: 25 mL/min — ABNORMAL LOW (ref >60)
Glucose, Bld: 159 mg/dL — ABNORMAL HIGH (ref 70–99)
Potassium: 4.5 mmol/L (ref 3.5–5.1)
Sodium: 143 mmol/L (ref 135–145)

## 2024-05-26 LAB — GLUCOSE, CAPILLARY
Glucose-Capillary: 133 mg/dL — ABNORMAL HIGH (ref 70–99)
Glucose-Capillary: 154 mg/dL — ABNORMAL HIGH (ref 70–99)
Glucose-Capillary: 101 mg/dL — ABNORMAL HIGH (ref 70–99)
Glucose-Capillary: 90 mg/dL (ref 70–99)
Glucose-Capillary: 118 mg/dL — ABNORMAL HIGH (ref 70–99)

## 2024-05-26 LAB — CBC
HCT: 33 % — ABNORMAL LOW (ref 36.0–46.0)
Hemoglobin: 11.2 g/dL — ABNORMAL LOW (ref 12.0–15.0)
MCH: 24.5 pg — ABNORMAL LOW (ref 26.0–34.0)
MCHC: 33.9 g/dL (ref 30.0–36.0)
MCV: 72.2 fL — ABNORMAL LOW (ref 80.0–100.0)
Platelets: 145 K/uL — ABNORMAL LOW (ref 150–400)
RBC: 4.57 MIL/uL (ref 3.87–5.11)
RDW: 19.1 % — ABNORMAL HIGH (ref 11.5–15.5)
WBC: 13.3 K/uL — ABNORMAL HIGH (ref 4.0–10.5)
nRBC: 0.3 % — ABNORMAL HIGH (ref 0.0–0.2)

## 2024-05-26 LAB — PROCALCITONIN: Procalcitonin: 1.27 ng/mL

## 2024-05-26 LAB — MAGNESIUM: Magnesium: 2.3 mg/dL (ref 1.7–2.4)

## 2024-05-26 LAB — STREP PNEUMONIAE URINARY ANTIGEN: Strep Pneumo Urinary Antigen: NEGATIVE

## 2024-05-26 LAB — MRSA NEXT GEN BY PCR, NASAL: MRSA by PCR Next Gen: NOT DETECTED

## 2024-05-26 MED ORDER — LACTATED RINGERS IV SOLN: INTRAVENOUS | Status: DC

## 2024-05-26 MED ORDER — METOPROLOL TARTRATE 50 MG PO TABS
50.0000 mg | ORAL_TABLET | Freq: Two times a day (BID) | ORAL | Status: DC
  Administered 2024-05-26 – 2024-06-09 (×28): 50 mg via ORAL
  Filled 2024-05-26 (×29): qty 1

## 2024-05-26 MED ORDER — BUDESONIDE 0.5 MG/2ML IN SUSP
0.5000 mg | Freq: Two times a day (BID) | RESPIRATORY_TRACT | Status: DC
  Administered 2024-05-26 – 2024-06-09 (×29): 0.5 mg via RESPIRATORY_TRACT
  Filled 2024-05-26 (×29): qty 2

## 2024-05-26 MED ORDER — LABETALOL HCL 5 MG/ML IV SOLN
10.0000 mg | INTRAVENOUS | Status: DC | PRN
  Administered 2024-05-30: 20 mg via INTRAVENOUS
  Filled 2024-05-26: qty 4

## 2024-05-26 MED ORDER — ASPIRIN 81 MG PO TBEC
81.0000 mg | DELAYED_RELEASE_TABLET | Freq: Every day | ORAL | Status: DC
  Administered 2024-05-26 – 2024-06-05 (×11): 81 mg via ORAL
  Filled 2024-05-26 (×11): qty 1

## 2024-05-26 MED FILL — Baricitinib Tab 2 MG: ORAL | Qty: 0.5 | Status: AC

## 2024-05-26 NOTE — Progress Notes (Signed)
 Pt continues with multiple episodes of taking bipap mask off and desating into the 70's and 80's. Pt most recently trialed off bipap after taking off again. Despite placing pt on 15L salter HFNC pt continued to have increase work of breathing and desaturation sustained in the mid 70's. Pt placed back on bipap at this time with improvement in SpO2 and work of breathing. Pt educated to use call bell before taking off bipap. Verbalizes understanding. RT will continue to monitor and be available as needed.

## 2024-05-26 NOTE — Progress Notes (Signed)
 83 year old with renal transplant and COPD on 4 to 6 L of oxygen  at home admitted with COVID-pneumonia and acute hypoxic respiratory failure.  She was started on a salter nasal cannula and has required BiPAP for 24 hours On 60% FiO2  Bilateral air entry, no accessory muscle use, S1-S2 tacky, soft nontender abdomen, no edema.  Chest x-ray shows mild improvement in bilateral lower lobe infiltrates  Labs show normal electrolytes, improved creatinine from 2.7-2.0, BUN stable at 44, increased leukocytosis 13K, stable anemia  Impression/plan Acute respiratory failure with hypoxia -try to transition to [facility]  COVID-19 positive test (U07.1, COVID-19) with Acute Pneumonia (J12.89, Other viral pneumonia) -On baricitinib  and dexamethasone  - Empiric Zosyn  and linezolid  while awaiting culture data as treatment for HAP  AKI improving Renal transplant -nephrology following and guiding immunosuppressants COPD without exacerbation -on Brovana  and Yupelri   CAD, chronic diastolic CHF -will resume metoprolol  and amlodipine  as blood pressure improves.  My independent critical care time was 33 minutes  Demitri Kucinski V. Jude MD

## 2024-05-26 NOTE — Progress Notes (Signed)
 Transition of Care [facility]) - Inpatient Brief Assessment   Patient Details  Name: [name] MRN: [id number] Date of Birth: Nov 19, 1941  Transition of Care [facility]) CM/SW Contact:    Robynn Eileen Hoose, RN Phone Number: 05/26/2024, 3:14 PM   Clinical Narrative: Patient from home with c/o shortness of breath. Patient is on home oxygen  at 4-6 L baseline. Pt currently on HFNC at 40 L, 93% FiO2. Patient is current with [facility] PT, confirmed with [facility] Rep Amy.   Transition of Care Asessment: Insurance and Status: (P) Insurance coverage has been reviewed Patient has primary care physician: (P) Yes Home environment has been reviewed: (P) Home Prior level of function:: (P) Independent on home O2 4-6L baseline Prior/Current Home Services: (P) Current home services ([facility] per [place]) Social Drivers of Health Review: (P) SDOH reviewed no interventions necessary Readmission risk has been reviewed: (P) Yes Transition of care needs: (P) no transition of care needs at this time

## 2024-05-26 NOTE — Progress Notes (Signed)
 NAME:  [name], MRN:  [id number], DOB:  April 29, 1941, LOS: 2 ADMISSION DATE:  05/24/2024, CONSULTATION DATE: 05/24/2024 REFERRING MD: Heddy Barren, DO, CHIEF COMPLAINT: SOB for 3 days   History of Present Illness:  A 83 year old female patient with HTN, ESRD s/p renal transplant 2012, 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 (quit smoking 20 yrs ago), CAD/CABG, with multiple recent hospitalizations for ECOPD/asthma and CHF exacerbation, D-CHF (EF 60% with 2 G2DD), DM-2, dyslipidemia, GERD, gout, CKD-3a, and chronic thrombocytopenia, who presented today for worsening dyspnea, dry cough, wheezing, and diarrhea for 3 days. No f/c/r, LL edema, CP, N/V, abd pain, rash, melena, or blood per rectum. Given Lasix  120 mg, Solumedrol 125 mg, Zosyn , and Linezolid . Reportedly oxygen  saturation was around 55% on 4 L, treated with a DuoNeb with improvement of saturation to 83% on NRM. On BiPAP 08/25/59%, RR 15, saturating at 95%. COVID positive.   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 On BiPAP 08/25/59%, RR 15, saturating at 95%. 05/24/2024: will admit to ICU  Interim History / Subjective:  Having difficulty weaning off bipap due to desaturation. Currently on bipap 60% fio2 sats 92% at rest and drop to mid 80s when talking CXR today w/ some mild improvement in b/l opacities  Objective    Blood pressure (!) 141/69, pulse 91, temperature 97.7 F (36.5 C), temperature source Axillary, resp. rate 19, height 5' 2 (1.575 m), weight 51.7 kg, last menstrual  period 03/04/2022, SpO2 94%.    Vent Mode: PCV;BIPAP FiO2 (%):  [50 %-100 %] 60 % Set Rate:  [15 bmp] 15 bmp PEEP:  [5 cmH20] 5 cmH20 Pressure Support:  [6 cmH20] 6 cmH20   Intake/Output Summary (Last 24 hours) at 05/26/2024 0734 Last data filed at 05/26/2024 0700 Gross per 24 hour  Intake 2235.48 ml  Output 780 ml  Net 1455.48 ml   Filed Weights   05/24/24 1326 05/25/24 0945  Weight: 54.4 kg 51.7 kg    Examination: General: critically ill appearing female on bipap HEENT: MM pink/moist; bipap in place Neuro: AO; MAE CV: s1s2, RRR, no m/r/g PULM:  dim clear BS bilaterally; on bipap 60% fio2 sats 92% GI: soft, bsx4 active  Extremities: warm/dry, no edema   Resolved problem list   Assessment and Plan  Acute on chronic hypoxic respiratory failure due to COVID PNA -on 4-6 L [place] at home Hx of COPD/asthma Plan: -will attempt to wean to Heated HFNC wean for sats >90% -bipap as needed -cont baricitinib  and dexamethasone ; airborne/contact precautions -bc, rvp, expectorated sputum pending -check urine legionella/strep -cont brovana /yupelri /pulmicort ; prn duoneb -currently on Zosyn  and Linezolid ; send mrsa pcr and pct -pulm toiletry: is/flutter -pt/ot -consider diuresis   AKI/CKD-3a: dehydration Hx of kidney transplant in 2012 Plan: -nephro following; appreciate recs -Trend BMP / urinary output -Replace electrolytes as indicated -Avoid nephrotoxic agents, ensure adequate renal perfusion -cont prograf ; currently on iv steroids hold home prednisone   Lactic acidosis due to dehydration and sepsis  Plan: -trend LA -hold iv fluids for now  HTN Plan: -resume home metoprolol ; hold imdur  and norvasc  for now -prn  labetalol   CAD/CABG, D-CHF (EF 60% with 2 G2DD) Plan: -daily weights; strict I/o's -consider diuresis -asa/zetia   DM-2 -A1c 6.8 on 7/6 Plan: -cont basal and ssi  -cbg monitoring  GERD Plan: -H2B  Chronic thrombocytopenia Plan: -trend cbc  Best  Practice (right click and Reselect all SmartList Selections daily)   Diet/type: NPO w/ oral meds DVT prophylaxis prophylactic heparin   Pressure ulcer(s): N/A GI prophylaxis: H2B Lines: N/A Foley:  N/A Code Status:  full code Last date of multidisciplinary goals of care discussion [7/7 patient updated at bedside]  Labs   CBC: Recent Labs  Lab 05/24/24 1331 05/24/24 1453 05/25/24 0550  WBC 12.3*  --  6.7  NEUTROABS 9.7*  --  5.1  HGB 11.9* 16.7* 11.2*  HCT 33.2* 49.0* 32.3*  MCV 73.8*  --  72.6*  PLT 131*  --  126*    Basic Metabolic Panel: Recent Labs  Lab 05/24/24 1436 05/24/24 1453 05/24/24 1800 05/25/24 0550  NA 138 133* 138 142  K 4.8 6.9* 4.9 4.3  CL 102  --  102 107  CO2 22  --  16* 21*  GLUCOSE 213*  --  327* 149*  BUN 44*  --  46* 49*  CREATININE 2.61*  --  2.94* 2.71*  CALCIUM  8.7*  --  8.9 8.4*  MG  --   --  2.7* 2.4  PHOS  --   --  4.3 4.1   GFR: Estimated Creatinine Clearance: 12.9 mL/min (A) (by C-G formula based on SCr of 2.71 mg/dL (H)). Recent Labs  Lab 05/24/24 1331 05/24/24 1814 05/25/24 0550  WBC 12.3*  --  6.7  LATICACIDVEN  --  8.0* 2.9*    Liver Function Tests: Recent Labs  Lab 05/24/24 1800 05/25/24 0550  AST 31  --   ALT 13  --   ALKPHOS 47  --   BILITOT 0.7  --   PROT 6.0*  --   ALBUMIN 2.6*  2.6* 2.5*   No results for input(s): LIPASE, AMYLASE in the last 168 hours. No results for input(s): AMMONIA in the last 168 hours.  ABG    Component Value Date/Time   PHART 7.406 (H) 10/16/2011 1057   PCO2ART 36.2 10/16/2011 1057   PO2ART 102.0 (H) 10/16/2011 1057   HCO3 18.3 (L) 05/24/2024 2100   TCO2 26 05/24/2024 1453   ACIDBASEDEF 7.9 (H) 05/24/2024 2100   O2SAT 78.2 05/24/2024 2100     Coagulation Profile: No results for input(s): INR, PROTIME in the last 168 hours.  Cardiac Enzymes: No results for input(s): CKTOTAL, CKMB, CKMBINDEX, TROPONINI in the last 168 hours.  HbA1C: Hemoglobin A1C   Date/Time Value Ref Range Status  10/24/2023 10:31 AM 6.1 (A) 4.0 - 5.6 % Final  01/01/2023 10:01 AM 6.0 (A) 4.0 - 5.6 % Final   Hgb A1c MFr Bld  Date/Time Value Ref Range Status  05/25/2024 05:50 AM 6.8 (H) 4.8 - 5.6 % Final    Comment:    (NOTE) Diagnosis of Diabetes The following HbA1c ranges recommended by the American Diabetes Association (ADA) may be used as an aid in the diagnosis of diabetes mellitus.  Hemoglobin             Suggested A1C NGSP%              Diagnosis  <5.7                   Non Diabetic  5.7-6.4  Pre-Diabetic  >6.4                   Diabetic  <7.0                   Glycemic control for                       adults with diabetes.    02/13/2018 03:05 AM 5.7 (H) 4.8 - 5.6 % Final    Comment:    (NOTE) Pre diabetes:          5.7%-6.4% Diabetes:              >6.4% Glycemic control for   <7.0% adults with diabetes     CBG: Recent Labs  Lab 05/25/24 1522 05/25/24 1954 05/25/24 2052 05/25/24 2259 05/26/24 0344  GLUCAP 173* 60* 106* 95 133*    Review of Systems:   N/a   Past Medical History:  She,  has a past medical history of Anemia, Asthma, Cellulitis (12/19/2019), Coronary artery disease, Diabetes mellitus, ESRD on dialysis [facility]), Family history of breast cancer, Family history of prostate cancer, GERD (gastroesophageal reflux disease), GI bleed (11/26/2010), Gout, History of methicillin resistant staphylococcus aureus (MRSA), Hyperlipidemia, Hyperparathyroidism, Hypertension, Myocardial infarction (HCC), Osteoporosis, Pneumonia (08/2010 ), Primary osteoarthritis, left shoulder (08/01/2016), S/P kidney transplant, and Subacromial impingement of left shoulder (08/01/2016).   Surgical History:   Past Surgical History:  Procedure Laterality Date   ABDOMINAL HYSTERECTOMY  1980'S   ARTERIOVENOUS GRAFT PLACEMENT Left 03/04/2007   forearm   BIOPSY  09/30/2019   Procedure: BIOPSY;  Surgeon: Dianna Specking, MD;  Location: [facility];  Service: Endoscopy;;   BIOPSY  12/27/2021   Procedure: BIOPSY;  Surgeon: Dianna Specking, MD;  Location: [facility];  Service: Endoscopy;;   BREAST EXCISIONAL BIOPSY Right    benign more than 10 yr ago   BREAST SURGERY  YRS AGO   RT BREAST CYST REMOVED    CARDIAC CATHETERIZATION  06/03/2002; 12/04/2007   COLONOSCOPY WITH PROPOFOL  N/A 07/14/2014   Procedure: COLONOSCOPY WITH PROPOFOL ;  Surgeon: Specking KYM Dianna, MD;  Location: [facility];  Service: Endoscopy;  Laterality: N/A;   COLONOSCOPY WITH PROPOFOL  N/A 09/30/2019   Procedure: COLONOSCOPY WITH PROPOFOL ;  Surgeon: Dianna Specking, MD;  Location: [facility];  Service: Endoscopy;  Laterality: N/A;   COLONOSCOPY WITH PROPOFOL  N/A 12/27/2021   Procedure: COLONOSCOPY WITH PROPOFOL ;  Surgeon: Dianna Specking, MD;  Location: [facility];  Service: Endoscopy;  Laterality: N/A;   CORONARY ARTERY BYPASS GRAFT  06/06/2002   x 4   ESOPHAGOGASTRODUODENOSCOPY N/A 12/27/2021   Procedure: ESOPHAGOGASTRODUODENOSCOPY (EGD);  Surgeon: Dianna Specking, MD;  Location: [facility];  Service: Endoscopy;  Laterality: N/A;   HEMOSTASIS CLIP PLACEMENT  12/27/2021   Procedure: HEMOSTASIS CLIP PLACEMENT;  Surgeon: Dianna Specking, MD;  Location: [facility];  Service: Endoscopy;;   HOT HEMOSTASIS N/A 07/14/2014   Procedure: HOT HEMOSTASIS (ARGON PLASMA COAGULATION/BICAP);  Surgeon: Specking KYM Dianna, MD;  Location: [facility];  Service: Endoscopy;  Laterality: N/A;   IR FLUORO GUIDE CV LINE RIGHT  12/22/2019   IR US  GUIDE VASC ACCESS RIGHT  12/22/2019   KIDNEY TRANSPLANT Right 08/04/2011   ORIF PATELLA Left 04/06/2016   Procedure: OPEN REDUCTION INTERNAL (ORIF) LEFT  PATELLA;  Surgeon: Toribio JULIANNA Chancy, MD;  Location: [facility];  Service: Orthopedics;  Laterality: Left;   POLYPECTOMY  09/30/2019   Procedure: POLYPECTOMY;  Surgeon: Dianna Specking, MD;  Location:  [facility];  Service: Endoscopy;;   POLYPECTOMY  12/27/2021    Procedure: POLYPECTOMY;  Surgeon: Dianna Specking, MD;  Location: [facility];  Service: Endoscopy;;   SHOULDER ARTHROSCOPY WITH ROTATOR CUFF REPAIR AND SUBACROMIAL DECOMPRESSION Left 08/03/2016   Procedure: LEFT SHOULDER ARTHROSCOPY WITH ROTATOR CUFF REPAIR AND SUBACROMIAL DECOMPRESSION with distal claviculectomy and extentsive debridement;  Surgeon: Toribio JULIANNA Chancy, MD;  Location: [facility];  Service: Orthopedics;  Laterality: Left;  Block   THROMBECTOMY AND REVISION OF ARTERIOVENTOUS (AV) GORETEX  GRAFT Left 05/08/2007   forearm   TOTAL HIP ARTHROPLASTY  12/18/2012   Procedure: TOTAL HIP ARTHROPLASTY;  Surgeon: Toribio JULIANNA Chancy, MD;  [facility];  Service: Orthopedics;  Laterality: Left;   VESICOVAGINAL FISTULA CLOSURE W/ TAH  1984     Social History:   reports that she quit smoking about 40 years ago. Her smoking use included cigarettes. She started smoking about 48 years ago. She has a 8 pack-year smoking history. She has never used smokeless tobacco. She reports that she does not drink alcohol and does not use drugs.   Family History:  Her family history includes Lung cancer in her brother; Prostate cancer in her brother. There is no history of Cancer.   Allergies Allergies  Allergen Reactions   Sodium Hypochlorite Shortness Of Breath   Lactose Nausea And Vomiting   Asa Arthritis Strength-Antacid [Aspirin  Buffered] Nausea And Vomiting and Other (See Comments)    STOMACH BURNS, also   Aspirin  Nausea And Vomiting    Per pt. can tolerate the enteric coated tablets.    Atorvastatin Other (See Comments)    Patient's skin was skin was sensitive ALL NSAIDS   Banana Nausea And Vomiting   Lactose Intolerance (Gi) Nausea And Vomiting   Lisinopril Cough     Home Medications  Prior to Admission medications   Medication Sig Start Date End Date Taking? Authorizing Provider  acetaminophen  (TYLENOL ) 650 MG CR tablet Take 650 mg by mouth daily as needed for pain.   Yes  [provider]  albuterol  (PROVENTIL ) (2.5 MG/3ML) 0.083% nebulizer solution Take 2.5 mg by nebulization every 6 (six) hours as needed for wheezing or shortness of breath.   Yes [provider]  albuterol  (VENTOLIN  HFA) 108 (90 Base) MCG/ACT inhaler Inhale 2 puffs into the lungs every 6 (six) hours as needed for wheezing or shortness of breath.   Yes [provider]  amLODipine  (NORVASC ) 10 MG tablet Take 10 mg by mouth daily.    Yes [provider]  aspirin  EC 81 MG tablet Take 81 mg by mouth daily.   Yes [provider]  budesonide  (PULMICORT ) 0.25 MG/2ML nebulizer solution Inhale 0.25 mg into the lungs in the morning, at noon, in the evening, and at bedtime. 02/11/20  Yes [provider]  calcitRIOL  (ROCALTROL ) 0.25 MCG capsule Take 0.25 mcg by mouth daily.    Yes [provider]  ezetimibe  (ZETIA ) 10 MG tablet TAKE 1 TABLET BY MOUTH EVERY DAY 01/16/24  Yes Nahser, Aleene PARAS, MD  fluticasone  (FLONASE ) 50 MCG/ACT nasal spray Place 1 spray into both nostrils in the morning, at noon, and at bedtime. 05/30/21  Yes [provider]  Fluticasone -Umeclidin-Vilant (TRELEGY ELLIPTA) 200-62.5-25 MCG/ACT AEPB Inhale 1 puff into the lungs in the morning, at noon, and at bedtime.   Yes [provider]  isosorbide  mononitrate (IMDUR ) 30 MG 24 hr tablet Take 1 tablet (30 mg total) by mouth daily. 04/15/24  Yes Nahser, Aleene PARAS,  MD  KLOR-CON  M20 20 MEQ tablet TAKE 1 TABLET BY MOUTH EVERY DAY 11/22/23  Yes Nahser, Aleene PARAS, MD  metoprolol  tartrate (LOPRESSOR ) 50 MG tablet TAKE 1 TABLET BY MOUTH TWICE A DAY 07/24/23  Yes Nahser, Aleene PARAS, MD  nitroGLYCERIN  (NITROSTAT ) 0.4 MG SL tablet Place 1 tablet (0.4 mg total) under the tongue every 5 (five) minutes as needed for chest pain. 10/03/22  Yes Nahser, Aleene PARAS, MD  omeprazole (PRILOSEC) 20 MG capsule Take 20 mg by mouth daily after breakfast.   Yes [provider]  predniSONE  (DELTASONE )  5 MG tablet Take 5 mg by mouth daily with breakfast.   Yes [provider]  tacrolimus  (PROGRAF ) 1 MG capsule Take 4 mg by mouth See admin instructions. Take 4 mg by mouth at 9 AM and 4 mg at 9 PM   Yes [provider]  Tiotropium Bromide  Monohydrate 1.25 MCG/ACT AERS Inhale 1 puff into the lungs in the morning and at bedtime. 10/23/17  Yes [provider]  torsemide  (DEMADEX ) 20 MG tablet Take 1 tablet (20 mg total) by mouth daily. 04/23/24 07/22/24 Yes Dahal, Chapman, MD  Azelastine  HCl 0.15 % SOLN Place 1 spray into both nostrils daily as needed for allergies. Patient not taking: Reported on 05/24/2024    [provider]  ONETOUCH VERIO test strip 1 EACH BY OTHER ROUTE DAILY IN THE AFTERNOON. USE AS INSTRUCTED 07/02/23   Shamleffer, Donell Cardinal, MD     Critical care time: 35 minutes     JD Emilio RIGGERS [facility] 05/26/2024, 10:42 AM  Please see [email or web address] for pager details.  From 7A-7P if no response, please call [phone]. After hours, please call ELink [phone].

## 2024-05-26 NOTE — Progress Notes (Signed)
 [facility] NEPHROLOGY PROGRESS NOTE  Assessment/ Plan: Pt is a 83 y.o. yo female   with past medical history significant for hypertension, HLD, ESRD status post deceased donor kidney transplant in 2012, COPD on home oxygen , CAD status post CABG, recent multiple hospitalization for shortness of breath/COPD presented now with worsening shortness of breath and hypoxia, seen as a consultation for the evaluation and management of AKI on CKD and transplant patient.   # Acute on chronic respiratory failure with hypoxia due to COVID PNA: on baricitinib  and dex, also pip/tazo and linezolid  awaiting sputum cx for HAP tx.  Weaned from bipap to HFNC.  Per pulm.   # Acute kidney injury on CKD stage IIIa: Baseline creatinine level seems to be fluctuating anywhere between 1.2-1.7: AKI is likely due to ischemic ATN in the setting of sepsis and relative hypotension.  UA with chronic proteinuria, no active sediment.  Transplant kidney ultrasound unremarkable. Thankfully Cr improved to 2 today. She remains NPO so will resume MIVF LR 75/hr x24h, d/c if any s/s pulmonary edema.  Strict ins and outs and daily lab.   # History of kidney transplant in 2012: Continue tacrolimus  and Decadron  because of COVID - change back to po prednisone  5 daily when decadron  off.  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   # History of hypertension: Hold antihypertensive medication.  BP 130-140s, resume outpt meds if trending up.   #chronic HFpEF: euvolemic currently.  MIVF as above given NPO.   Subjective: Patient is on HFNC now.  Urine output is Improved to yesterday. Remains NPO.   Objective Vital signs in last 24 hours: Vitals:   05/26/24 0800 05/26/24 0900 05/26/24 1000 05/26/24 1137  BP: (!) 130/59 (!) 140/64 138/74   Pulse: 88 92 92 (!) 106  Resp: 18 (!) 21 16 (!) 25  Temp:  98.6 F (37 C)    TempSrc:  Oral    SpO2: 93% 91% 99% (!) 88%  Weight:      Height:        Weight change: -2.732 kg  Intake/Output Summary (Last 24 hours) at 05/26/2024 1201 Last data filed at 05/26/2024 0930 Gross per 24 hour  Intake 1893.75 ml  Output 930 ml  Net 963.75 ml       Labs: RENAL PANEL Recent Labs  Lab 05/24/24 1436 05/24/24 1453 05/24/24 1800 05/25/24 0550 05/26/24 0746  NA 138 133* 138 142 143  K 4.8 6.9* 4.9 4.3 4.5  CL 102  --  102 107 107  CO2 22  --  16* 21* 18*  GLUCOSE 213*  --  327* 149* 159*  BUN 44*  --  46* 49* 44*  CREATININE 2.61*  --  2.94* 2.71* 1.99*  CALCIUM  8.7*  --  8.9 8.4* 8.5*  MG  --   --  2.7* 2.4  --   PHOS  --   --  4.3 4.1  --   ALBUMIN  --   --  2.6*  2.6* 2.5*  --     Liver Function Tests: Recent Labs  Lab 05/24/24 1800 05/25/24 0550  AST 31  --   ALT 13  --   ALKPHOS 47  --   BILITOT 0.7  --   PROT 6.0*  --   ALBUMIN 2.6*  2.6* 2.5*   No results for input(s): LIPASE, AMYLASE in the last 168 hours. No results for input(s): AMMONIA in the last 168 hours. CBC: Recent  Labs    04/22/24 0455 05/24/24 1331 05/24/24 1453 05/25/24 0550 05/26/24 0746  HGB 12.1 11.9* 16.7* 11.2* 11.2*  MCV 72.5* 73.8*  --  72.6* 72.2*    Cardiac Enzymes: No results for input(s): CKTOTAL, CKMB, CKMBINDEX, TROPONINI in the last 168 hours. CBG: Recent Labs  Lab 05/25/24 1954 05/25/24 2052 05/25/24 2259 05/26/24 0344 05/26/24 0818  GLUCAP 60* 106* 95 133* 154*    Iron Studies: No results for input(s): IRON, TIBC, TRANSFERRIN, FERRITIN in the last 72 hours. Studies/Results: DG Chest Port 1 View Result Date: 05/26/2024 CLINICAL DATA:  Acute respiratory failure with hypoxemia. EXAM: PORTABLE CHEST 1 VIEW COMPARISON:  Radiographs 05/24/2024 and 04/20/2024.  CT 04/19/2024. FINDINGS: 0904 hours. The heart size and mediastinal contours are stable post median sternotomy and CABG. Interval partial clearing of the patchy bibasilar airspace opacity seen on the most recent prior study. No progressive  airspace disease, significant pleural effusion or pneumothorax. No acute osseous findings. 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 IMPRESSION: Interval partial clearing of the patchy bibasilar airspace opacity seen on the most recent prior study of 2 days ago. No new or progressive findings. Electronically Signed   By: Elsie Perone M.D.   On: 05/26/2024 09:15   US  Renal Transplant w/Doppler Result Date: 05/25/2024 CLINICAL DATA:  Evaluate renal graft.  Acute kidney injury. EXAM: ULTRASOUND OF RENAL TRANSPLANT WITH RENAL DOPPLER ULTRASOUND TECHNIQUE: Ultrasound examination of the renal transplant was performed with gray-scale, color and duplex doppler evaluation. COMPARISON:  08/07/2023 FINDINGS: Transplant kidney location: RLQ Transplant Kidney: Renal measurements: 9.9 x 4.7 x 4.6 cm = volume: 111.20mL. Normal in size and parenchymal echogenicity. No evidence of mass or hydronephrosis. No peri-transplant fluid collection seen. Upper pole anechoic cyst measures 2.6 x 2.2 x 2.2 cm. Color flow in the main renal artery:  Yes Color flow in the main renal vein:  Yes Duplex Doppler Evaluation: Main Renal Artery Resistive Index: 0.86 Venous waveform in main renal vein:  present/absent Intrarenal resistive index in upper pole:  0.6 (normal 0.6-0.8; equivocal 0.8-0.9; abnormal >= 0.9) Intrarenal resistive index in lower pole: 0.7 (normal 0.6-0.8; equivocal 0.8-0.9; abnormal >= 0.9) Bladder: Not imaged. Other findings:  Exam detail diminished as patient was on BiPAP. IMPRESSION: 1. Normal sonographic appearance of the renal transplant. 2. No evidence for hydronephrosis or peritransplant fluid collection. 3. Patent renal vasculature. Electronically Signed   By: Waddell Calk M.D.   On: 05/25/2024 05:53   DG Chest Portable 1 View Result Date: 05/24/2024 CLINICAL DATA:  Shortness of breath. EXAM: PORTABLE CHEST 1 VIEW COMPARISON:  Radiograph 04/20/2024 and  04/17/2024.  CT 04/19/2024. FINDINGS: 1336 hours. 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 There is mild cardiomegaly and aortic atherosclerosis. There are new patchy, basilar predominant airspace opacities in both lungs without consolidation. No significant pleural effusion or pneumothorax. No acute osseous findings are evident. The upper sternotomy wires are chronically fractured. IMPRESSION: New patchy, basilar predominant airspace opacities in both lungs, suspicious for edema superimposed on emphysema or multifocal bronchopneumonia. Radiographic follow-up recommended. Electronically Signed   By: Elsie Perone M.D.   On: 05/24/2024 13:54    Medications: Infusions:  piperacillin -tazobactam (ZOSYN )  IV Stopped (05/26/24 0636)    Scheduled Medications:  arformoterol   15 mcg Nebulization BID   aspirin  EC  81 mg Oral Daily   baricitinib   1 mg Oral Daily   budesonide  (PULMICORT ) nebulizer  solution  0.5 mg Nebulization BID   Chlorhexidine  Gluconate Cloth  6 each Topical Daily   dexamethasone  (DECADRON ) injection  6 mg Intravenous Q24H   ezetimibe   10 mg Oral Daily   famotidine   20 mg Oral QHS   guaiFENesin   1,200 mg Oral BID   heparin   5,000 Units Subcutaneous Q8H   insulin  aspart  3-9 Units Subcutaneous Q4H   insulin  glargine-yfgn  0.15 Units/kg Subcutaneous Daily   metoprolol  tartrate  50 mg Oral BID   revefenacin   175 mcg Nebulization Daily   sodium chloride  flush  3 mL Intravenous Q12H   tacrolimus   4 mg Oral BID    have reviewed scheduled and prn medications.  Physical Exam: General: awake and alert, no distress Heart:RRR, s1s2 nl Lungs: normal WOB on HFNC Extremities:No edema Neurology alert awake and following commands:    Manuelita DELENA Barters 05/26/2024,12:01 PM  LOS: 2 days

## 2024-05-27 DIAGNOSIS — J9621 Acute and chronic respiratory failure with hypoxia: Secondary | ICD-10-CM | POA: Diagnosis not present

## 2024-05-27 DIAGNOSIS — U071 COVID-19: Secondary | ICD-10-CM | POA: Diagnosis not present

## 2024-05-27 DIAGNOSIS — J1282 Pneumonia due to coronavirus disease 2019: Secondary | ICD-10-CM | POA: Diagnosis not present

## 2024-05-27 DIAGNOSIS — N1831 Chronic kidney disease, stage 3a: Secondary | ICD-10-CM | POA: Diagnosis not present

## 2024-05-27 LAB — RESPIRATORY PANEL BY PCR

## 2024-05-27 LAB — CBC
HCT: 30.1 % — ABNORMAL LOW (ref 36.0–46.0)
Hemoglobin: 10.4 g/dL — ABNORMAL LOW (ref 12.0–15.0)
MCH: 24.9 pg — ABNORMAL LOW (ref 26.0–34.0)
MCHC: 34.6 g/dL (ref 30.0–36.0)
MCV: 72.2 fL — ABNORMAL LOW (ref 80.0–100.0)
Platelets: 151 K/uL (ref 150–400)
RBC: 4.17 MIL/uL (ref 3.87–5.11)
RDW: 19.1 % — ABNORMAL HIGH (ref 11.5–15.5)
WBC: 10.3 K/uL (ref 4.0–10.5)
nRBC: 0.3 % — ABNORMAL HIGH (ref 0.0–0.2)

## 2024-05-27 LAB — MAGNESIUM: Magnesium: 2.4 mg/dL (ref 1.7–2.4)

## 2024-05-27 LAB — BASIC METABOLIC PANEL WITH GFR
Anion gap: 10 (ref 5–15)
BUN: 41 mg/dL — ABNORMAL HIGH (ref 8–23)
CO2: 21 mmol/L — ABNORMAL LOW (ref 22–32)
Calcium: 8.6 mg/dL — ABNORMAL LOW (ref 8.9–10.3)
Chloride: 111 mmol/L (ref 98–111)
Creatinine, Ser: 1.72 mg/dL — ABNORMAL HIGH (ref 0.44–1.00)
GFR, Estimated: 30 mL/min — ABNORMAL LOW (ref >60)
Glucose, Bld: 129 mg/dL — ABNORMAL HIGH (ref 70–99)
Potassium: 5.1 mmol/L (ref 3.5–5.1)
Sodium: 142 mmol/L (ref 135–145)

## 2024-05-27 LAB — GLUCOSE, CAPILLARY
Glucose-Capillary: 96 mg/dL (ref 70–99)
Glucose-Capillary: 114 mg/dL — ABNORMAL HIGH (ref 70–99)
Glucose-Capillary: 113 mg/dL — ABNORMAL HIGH (ref 70–99)
Glucose-Capillary: 149 mg/dL — ABNORMAL HIGH (ref 70–99)
Glucose-Capillary: 77 mg/dL (ref 70–99)
Glucose-Capillary: 111 mg/dL — ABNORMAL HIGH (ref 70–99)

## 2024-05-27 LAB — LACTIC ACID, PLASMA: Lactic Acid, Venous: 2 mmol/L (ref 0.5–1.9)

## 2024-05-27 LAB — PHOSPHORUS: Phosphorus: 4.3 mg/dL (ref 2.5–4.6)

## 2024-05-27 MED ORDER — SODIUM CHLORIDE 0.9 % IV SOLN: INTRAVENOUS | Status: AC | PRN

## 2024-05-27 MED ORDER — CALCIUM POLYCARBOPHIL 625 MG PO TABS
625.0000 mg | ORAL_TABLET | Freq: Every day | ORAL | Status: DC
  Administered 2024-05-27 – 2024-06-09 (×12): 625 mg via ORAL
  Filled 2024-05-27 (×15): qty 1

## 2024-05-27 NOTE — Progress Notes (Signed)
 Better tolerance during transfer using 100% NRB mask in addition to HHF [place]. SaO2 decreasing to 82 - 84%, but stabilizes with rest.

## 2024-05-27 NOTE — Progress Notes (Signed)
 [facility] NEPHROLOGY PROGRESS NOTE  Assessment/ Plan: Pt is a 83 y.o. yo female   with past medical history significant for hypertension, HLD, ESRD status post deceased donor kidney transplant in 2012, COPD on home oxygen , CAD status post CABG, recent multiple hospitalization for shortness of breath/COPD presented now with worsening shortness of breath and hypoxia, seen as a consultation for the evaluation and management of AKI on CKD and transplant patient.   # Acute on chronic respiratory failure with hypoxia due to COVID PNA: on baricitinib  and dex, also pip/tazo and linezolid  awaiting sputum cx for HAP tx.  Weaned from bipap to HFNC.  Per pulm.   # Acute kidney injury on CKD stage IIIa: Baseline creatinine level seems to be fluctuating anywhere between 1.2-1.7: AKI is likely due to ischemic ATN in the setting of sepsis and relative hypotension.  UA with chronic proteinuria, no active sediment.  Transplant kidney ultrasound unremarkable. Thankfully Cr improved to baseline today. She is now eating so will let MIVF expire this AM - reorder if not taking po.  Strict ins and outs and daily lab.   # History of kidney transplant in 2012: Continue tacrolimus  and Decadron  because of COVID - change back to po prednisone  5 daily when decadron  off.  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   # History of hypertension: Hold antihypertensive medication.  BP 110-140s, resume outpt meds if trending up.   #chronic HFpEF: euvolemic currently.   Will follow peripherally for now that she's back to baseline kidney function.  Please don't hesitate to reach out with any concerns I can help with.   Subjective: Patient is on HFNC now.  Urine output is yesterday. Now able to eat.   Objective Vital signs in last 24 hours: Vitals:   05/27/24 0530 05/27/24 0811 05/27/24 0814 05/27/24 0815  BP:  127/66  127/66  Pulse: 74 75  72  Resp: 17 (!) 22  (!) 22  Temp:       TempSrc:      SpO2: 95% 96% 100% 94%  Weight:      Height:       Weight change:   Intake/Output Summary (Last 24 hours) at 05/27/2024 0912 Last data filed at 05/27/2024 0200 Gross per 24 hour  Intake 718 ml  Output 600 ml  Net 118 ml       Labs: RENAL PANEL Recent Labs  Lab 05/24/24 1436 05/24/24 1453 05/24/24 1800 05/25/24 0550 05/26/24 0746 05/26/24 0829 05/27/24 0648  NA 138 133* 138 142 143  --  142  K 4.8 6.9* 4.9 4.3 4.5  --  5.1  CL 102  --  102 107 107  --  111  CO2 22  --  16* 21* 18*  --  21*  GLUCOSE 213*  --  327* 149* 159*  --  129*  BUN 44*  --  46* 49* 44*  --  41*  CREATININE 2.61*  --  2.94* 2.71* 1.99*  --  1.72*  CALCIUM  8.7*  --  8.9 8.4* 8.5*  --  8.6*  MG  --   --  2.7* 2.4  --  2.3 2.4  PHOS  --   --  4.3 4.1  --   --  4.3  ALBUMIN  --   --  2.6*  2.6* 2.5*  --   --   --     Liver Function Tests: Recent Labs  Lab 05/24/24 1800  05/25/24 0550  AST 31  --   ALT 13  --   ALKPHOS 47  --   BILITOT 0.7  --   PROT 6.0*  --   ALBUMIN 2.6*  2.6* 2.5*   No results for input(s): LIPASE, AMYLASE in the last 168 hours. No results for input(s): AMMONIA in the last 168 hours. CBC: Recent Labs    05/24/24 1331 05/24/24 1453 05/25/24 0550 05/26/24 0746 05/27/24 0648  HGB 11.9* 16.7* 11.2* 11.2* 10.4*  MCV 73.8*  --  72.6* 72.2* 72.2*    Cardiac Enzymes: No results for input(s): CKTOTAL, CKMB, CKMBINDEX, TROPONINI in the last 168 hours. CBG: Recent Labs  Lab 05/26/24 1603 05/26/24 1953 05/26/24 2310 05/27/24 0305 05/27/24 0825  GLUCAP 90 118* 96 114* 113*    Iron Studies: No results for input(s): IRON, TIBC, TRANSFERRIN, FERRITIN in the last 72 hours. Studies/Results: DG Chest Port 1 View Result Date: 05/26/2024 CLINICAL DATA:  Acute respiratory failure with hypoxemia. EXAM: PORTABLE CHEST 1 VIEW COMPARISON:  Radiographs 05/24/2024 and 04/20/2024.  CT 04/19/2024. FINDINGS: 0904 hours. The heart size and  mediastinal contours are stable post median sternotomy and CABG. Interval partial clearing of the patchy bibasilar airspace opacity seen on the most recent prior study. No progressive airspace disease, significant pleural effusion or pneumothorax. No acute osseous findings. 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 IMPRESSION: Interval partial clearing of the patchy bibasilar airspace opacity seen on the most recent prior study of 2 days ago. No new or progressive findings. Electronically Signed   By: Elsie Perone M.D.   On: 05/26/2024 09:15    Medications: Infusions:  piperacillin -tazobactam (ZOSYN )  IV 2.25 g (05/27/24 0521)    Scheduled Medications:  arformoterol   15 mcg Nebulization BID   aspirin  EC  81 mg Oral Daily   baricitinib   1 mg Oral Daily   budesonide  (PULMICORT ) nebulizer solution  0.5 mg Nebulization BID   Chlorhexidine  Gluconate Cloth  6 each Topical Daily   dexamethasone  (DECADRON ) injection  6 mg Intravenous Q24H   ezetimibe   10 mg Oral Daily   famotidine   20 mg Oral QHS   guaiFENesin   1,200 mg Oral BID   heparin   5,000 Units Subcutaneous Q8H   insulin  aspart  3-9 Units Subcutaneous Q4H   insulin  glargine-yfgn  0.15 Units/kg Subcutaneous Daily   metoprolol  tartrate  50 mg Oral BID   polycarbophil  625 mg Oral Daily   revefenacin   175 mcg Nebulization Daily   sodium chloride  flush  3 mL Intravenous Q12H   tacrolimus   4 mg Oral BID    have reviewed scheduled and prn medications.  Physical Exam: General: awake and alert, no distress Heart:RRR, s1s2 nl Lungs: normal WOB on HFNC Extremities:No edema Neurology alert awake and following commands:    [name] 05/27/2024,9:12 AM  LOS: 3 days

## 2024-05-27 NOTE — Plan of Care (Signed)

## 2024-05-27 NOTE — Progress Notes (Signed)
 OT Cancellation Note  Patient Details Name: [name] MRN: [id number] DOB: [birth year]   Cancelled Treatment:    Reason Eval/Treat Not Completed: Medical issues which prohibited therapy (pt desatting to 75s with RN during pivot transfer, RN asking to hold at this time, will follow up next date as schedule permits)  Iliya Spivack K, OTD, OTR/L [facility] [phone]   [name] 05/27/2024, 11:36 AM

## 2024-05-27 NOTE — Progress Notes (Signed)
 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 No f/c/r, LL edema, CP, N/V, abd pain, rash, melena, or blood per rectum. Given Lasix  120 mg, Solumedrol 125 mg, Zosyn , and Linezolid . Reportedly oxygen  saturation was around 55% on 4 L, treated with a DuoNeb with improvement of saturation to 83% on NRM. On BiPAP 08/25/59%, RR 15, saturating at 95%. COVID positive.   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 On BiPAP 08/25/59%, RR 15, saturating at 95%. 05/24/2024: will admit to ICU 7/7 placed on heated HFNC   Interim History / Subjective:  Transitioned off bipap yesterday morning to heated HFNC wore for 24 hours; on 75% fio2 and 35 l/m. Does desat with mvt and takes time to recruit back up.  Objective    Blood pressure (!) 141/63, pulse 74, temperature 98.2 F (36.8 C), temperature source Oral, resp. rate 17, height 5' 2 (1.575 m), weight 51.7 kg, last menstrual period  03/04/2022, SpO2 95%.    FiO2 (%):  [75 %-93 %] 75 %   Intake/Output Summary (Last 24 hours) at 05/27/2024 0717 Last data filed at 05/27/2024 0200 Gross per 24 hour  Intake 810.39 ml  Output 600 ml  Net 210.39 ml   Filed Weights   05/24/24 1326 05/25/24 0945  Weight: 54.4 kg 51.7 kg    Examination: General: ill appearing female on HHFNC HEENT: MM pink/moist; HHFNC in place Neuro: AOx3; MAE CV: s1s2, RRR, no m/r/g PULM:  dim clear BS bilaterally; on heated HFNC 75% 35 l/m GI: soft, bsx4 active  Extremities: warm/dry, no edema   Resolved problem list   Assessment and Plan  Acute on chronic hypoxic respiratory failure due to COVID PNA -on 4-6 L [place] at home Hx of COPD/asthma Plan: -cont Heated HFNC wean for sats >90% -bipap as needed -cont baricitinib  and dexamethasone ; airborne/contact precautions -follow bc, rvp, expectorated sputum -check urine legionella; strep negative -cont brovana /yupelri /pulmicort ; prn duoneb -currently on Zosyn ; mrsa pcr negative linezolid  dc'd yesterday -pulm toiletry: is/flutter -pt/ot -would hold on iv fluid; consider diuresis   AKI/CKD-3a: dehydration Hx of kidney transplant in 2012 [place]: improving Plan: -nephro following; appreciate recs -Trend BMP / urinary output -Replace electrolytes as indicated -Avoid nephrotoxic agents, ensure adequate renal perfusion -cont prograf ; currently on iv steroids hold home prednisone   HTN Plan: -cont home metoprolol  -hold imdur  and norvasc  for now -prn labetalol   CAD/CABG, D-CHF (EF 60% with 2 G2DD) Plan: -daily weights; strict I/o's -consider diuresis -asa/zetia   DM-2 -A1c 6.8 on 7/6 Plan: -cont basal and ssi  -cbg monitoring  GERD Plan: -H2B  Chronic thrombocytopenia Plan: -trend cbc  Best Practice (right click and Reselect all SmartList Selections daily)   Diet/type: Regular consistency (see orders) DVT prophylaxis prophylactic heparin   Pressure ulcer(s): N/A GI prophylaxis:  H2B Lines: N/A Foley:  N/A Code Status:  full code Last date of multidisciplinary goals of care discussion [7/7 patient updated at bedside]  Labs   CBC: Recent Labs  Lab 05/24/24 1331 05/24/24 1453 05/25/24 0550 05/26/24 0746  WBC 12.3*  --  6.7 13.3*  NEUTROABS 9.7*  --  5.1  --   HGB 11.9* 16.7* 11.2* 11.2*  HCT 33.2* 49.0* 32.3* 33.0*  MCV 73.8*  --  72.6* 72.2*  PLT 131*  --  126* 145*    Basic Metabolic Panel: Recent Labs  Lab 05/24/24 1436 05/24/24 1453 05/24/24 1800 05/25/24 0550 05/26/24 0746 05/26/24 0829  NA 138 133* 138 142 143  --   K 4.8 6.9* 4.9 4.3 4.5  --   CL 102  --  102 107 107  --   CO2 22  --  16* 21* 18*  --   GLUCOSE 213*  --  327* 149* 159*  --   BUN 44*  --  46* 49* 44*  --   CREATININE 2.61*  --  2.94* 2.71* 1.99*  --   CALCIUM  8.7*  --  8.9 8.4* 8.5*  --   MG  --   --  2.7* 2.4  --  2.3  PHOS  --   --  4.3 4.1  --   --    GFR: Estimated Creatinine Clearance: 17.5 mL/min (A) (by C-G formula based on SCr of 1.99 mg/dL (H)). Recent Labs  Lab 05/24/24 1331 05/24/24 1814 05/25/24 0550 05/26/24 0746 05/26/24 0805  PROCALCITON  --   --   --   --  1.27  WBC 12.3*  --  6.7 13.3*  --   LATICACIDVEN  --  8.0* 2.9*  --   --     Liver Function Tests: Recent Labs  Lab 05/24/24 1800 05/25/24 0550  AST 31  --   ALT 13  --   ALKPHOS 47  --   BILITOT 0.7  --   PROT 6.0*  --   ALBUMIN 2.6*  2.6* 2.5*   No results for input(s): LIPASE, AMYLASE in the last 168 hours. No results for input(s): AMMONIA in the last 168 hours.  ABG    Component Value Date/Time   PHART 7.406 (H) 10/16/2011 1057   PCO2ART 36.2 10/16/2011 1057   PO2ART 102.0 (H) 10/16/2011 1057   HCO3 18.3 (L) 05/24/2024 2100   TCO2 26 05/24/2024 1453   ACIDBASEDEF 7.9 (H) 05/24/2024 2100   O2SAT 78.2 05/24/2024 2100     Coagulation Profile: No results for input(s): INR, PROTIME in the last 168 hours.  Cardiac Enzymes: No results for input(s): CKTOTAL,  CKMB, CKMBINDEX, TROPONINI in the last 168 hours.  HbA1C: Hemoglobin A1C  Date/Time Value Ref Range Status  10/24/2023 10:31 AM 6.1 (A) 4.0 - 5.6 % Final  01/01/2023 10:01 AM 6.0 (A) 4.0 - 5.6 % Final   Hgb A1c MFr Bld  Date/Time Value Ref Range Status  05/25/2024 05:50 AM 6.8 (H) 4.8 - 5.6 % Final    Comment:    (NOTE) Diagnosis of Diabetes The following HbA1c ranges recommended by the American Diabetes Association (ADA) may be used as an aid in the diagnosis of diabetes mellitus.  Hemoglobin  Suggested A1C NGSP%              Diagnosis  <5.7                   Non Diabetic  5.7-6.4                Pre-Diabetic  >6.4                   Diabetic  <7.0                   Glycemic control for                       adults with diabetes.    02/13/2018 03:05 AM 5.7 (H) 4.8 - 5.6 % Final    Comment:    (NOTE) Pre diabetes:          5.7%-6.4% Diabetes:              >6.4% Glycemic control for   <7.0% adults with diabetes     CBG: Recent Labs  Lab 05/26/24 0818 05/26/24 1216 05/26/24 1603 05/26/24 1953 05/26/24 2310  GLUCAP 154* 101* 90 118* 96    Review of Systems:   N/a   Past Medical History:  She,  has a past medical history of Anemia, Asthma, Cellulitis (12/19/2019), Coronary artery disease, Diabetes mellitus, ESRD on dialysis [facility]), Family history of breast cancer, Family history of prostate cancer, GERD (gastroesophageal reflux disease), GI bleed (11/26/2010), Gout, History of methicillin resistant staphylococcus aureus (MRSA), Hyperlipidemia, Hyperparathyroidism, Hypertension, Myocardial infarction (HCC), Osteoporosis, Pneumonia (08/2010 ), Primary osteoarthritis, left shoulder (08/01/2016), S/P kidney transplant, and Subacromial impingement of left shoulder (08/01/2016).   Surgical History:   Past Surgical History:  Procedure Laterality Date   ABDOMINAL HYSTERECTOMY  1980'S   ARTERIOVENOUS GRAFT PLACEMENT Left 03/04/2007   forearm   BIOPSY   09/30/2019   Procedure: BIOPSY;  Surgeon: Dianna Specking, MD;  Location: [facility];  Service: Endoscopy;;   BIOPSY  12/27/2021   Procedure: BIOPSY;  Surgeon: Dianna Specking, MD;  Location: [facility];  Service: Endoscopy;;   BREAST EXCISIONAL BIOPSY Right    benign more than 10 yr ago   BREAST SURGERY  YRS AGO   RT BREAST CYST REMOVED    CARDIAC CATHETERIZATION  06/03/2002; 12/04/2007   COLONOSCOPY WITH PROPOFOL  N/A 07/14/2014   Procedure: COLONOSCOPY WITH PROPOFOL ;  Surgeon: Specking KYM Dianna, MD;  Location: [facility];  Service: Endoscopy;  Laterality: N/A;   COLONOSCOPY WITH PROPOFOL  N/A 09/30/2019   Procedure: COLONOSCOPY WITH PROPOFOL ;  Surgeon: Dianna Specking, MD;  Location: [facility];  Service: Endoscopy;  Laterality: N/A;   COLONOSCOPY WITH PROPOFOL  N/A 12/27/2021   Procedure: COLONOSCOPY WITH PROPOFOL ;  Surgeon: Dianna Specking, MD;  Location: [facility];  Service: Endoscopy;  Laterality: N/A;   CORONARY ARTERY BYPASS GRAFT  06/06/2002   x 4   ESOPHAGOGASTRODUODENOSCOPY N/A 12/27/2021   Procedure: ESOPHAGOGASTRODUODENOSCOPY (EGD);  Surgeon: Dianna Specking, MD;  Location: [facility];  Service: Endoscopy;  Laterality: N/A;   HEMOSTASIS CLIP PLACEMENT  12/27/2021   Procedure: HEMOSTASIS CLIP PLACEMENT;  Surgeon: Dianna Specking, MD;  Location: [facility];  Service: Endoscopy;;   HOT HEMOSTASIS N/A 07/14/2014   Procedure: HOT HEMOSTASIS (ARGON PLASMA COAGULATION/BICAP);  Surgeon: Specking KYM Dianna, MD;  Location: [facility];  Service: Endoscopy;  Laterality: N/A;   IR FLUORO GUIDE CV LINE RIGHT  12/22/2019   IR US  GUIDE VASC ACCESS RIGHT  12/22/2019  KIDNEY TRANSPLANT Right 08/04/2011   ORIF PATELLA Left 04/06/2016   Procedure: OPEN REDUCTION INTERNAL (ORIF) LEFT  PATELLA;  Surgeon: Toribio JULIANNA Chancy, MD;  Location: [facility];  Service: Orthopedics;  Laterality: Left;   POLYPECTOMY  09/30/2019   Procedure: POLYPECTOMY;  Surgeon: Dianna Specking,  MD;  Location: [facility];  Service: Endoscopy;;   POLYPECTOMY  12/27/2021   Procedure: POLYPECTOMY;  Surgeon: Dianna Specking, MD;  Location: [facility];  Service: Endoscopy;;   SHOULDER ARTHROSCOPY WITH ROTATOR CUFF REPAIR AND SUBACROMIAL DECOMPRESSION Left 08/03/2016   Procedure: LEFT SHOULDER ARTHROSCOPY WITH ROTATOR CUFF REPAIR AND SUBACROMIAL DECOMPRESSION with distal claviculectomy and extentsive debridement;  Surgeon: Toribio JULIANNA Chancy, MD;  Location: [facility];  Service: Orthopedics;  Laterality: Left;  Block   THROMBECTOMY AND REVISION OF ARTERIOVENTOUS (AV) GORETEX  GRAFT Left 05/08/2007   forearm   TOTAL HIP ARTHROPLASTY  12/18/2012   Procedure: TOTAL HIP ARTHROPLASTY;  Surgeon: Toribio JULIANNA Chancy, MD;  Location: [facility];  Service: Orthopedics;  Laterality: Left;   VESICOVAGINAL FISTULA CLOSURE W/ TAH  1984     Social History:   reports that she quit smoking about 40 years ago. Her smoking use included cigarettes. She started smoking about 48 years ago. She has a 8 pack-year smoking history. She has never used smokeless tobacco. She reports that she does not drink alcohol and does not use drugs.   Family History:  Her family history includes Lung cancer in her brother; Prostate cancer in her brother. There is no history of Cancer.   Allergies Allergies  Allergen Reactions   Sodium Hypochlorite Shortness Of Breath   Lactose Nausea And Vomiting   Asa Arthritis Strength-Antacid [Aspirin  Buffered] Nausea And Vomiting and Other (See Comments)    STOMACH BURNS, also   Aspirin  Nausea And Vomiting    Per pt. can tolerate the enteric coated tablets.    Atorvastatin Other (See Comments)    Patient's skin was skin was sensitive ALL NSAIDS   Banana Nausea And Vomiting   Lactose Intolerance (Gi) Nausea And Vomiting   Lisinopril Cough     Home Medications  Prior to Admission medications   Medication Sig Start Date End Date Taking? Authorizing Provider  acetaminophen   (TYLENOL ) 650 MG CR tablet Take 650 mg by mouth daily as needed for pain.   Yes [provider]  albuterol  (PROVENTIL ) (2.5 MG/3ML) 0.083% nebulizer solution Take 2.5 mg by nebulization every 6 (six) hours as needed for wheezing or shortness of breath.   Yes [provider]  albuterol  (VENTOLIN  HFA) 108 (90 Base) MCG/ACT inhaler Inhale 2 puffs into the lungs every 6 (six) hours as needed for wheezing or shortness of breath.   Yes [provider]  amLODipine  (NORVASC ) 10 MG tablet Take 10 mg by mouth daily.    Yes [provider]  aspirin  EC 81 MG tablet Take 81 mg by mouth daily.   Yes [provider]  budesonide  (PULMICORT ) 0.25 MG/2ML nebulizer solution Inhale 0.25 mg into the lungs in the morning, at noon, in the evening, and at bedtime. 02/11/20  Yes [provider]  calcitRIOL  (ROCALTROL ) 0.25 MCG capsule Take 0.25 mcg by mouth daily.    Yes [provider]  ezetimibe  (ZETIA ) 10 MG tablet TAKE 1 TABLET BY MOUTH EVERY DAY 01/16/24  Yes Nahser, Aleene PARAS, MD  fluticasone  (FLONASE ) 50 MCG/ACT nasal spray Place 1 spray into both nostrils in the morning, at noon, and at bedtime. 05/30/21  Yes  [provider]  Fluticasone -Umeclidin-Vilant (TRELEGY ELLIPTA) 200-62.5-25 MCG/ACT AEPB Inhale 1 puff into the lungs in the morning, at noon, and at bedtime.   Yes [provider]  isosorbide  mononitrate (IMDUR ) 30 MG 24 hr tablet Take 1 tablet (30 mg total) by mouth daily. 04/15/24  Yes Nahser, Aleene PARAS, MD  KLOR-CON  M20 20 MEQ tablet TAKE 1 TABLET BY MOUTH EVERY DAY 11/22/23  Yes Nahser, Aleene PARAS, MD  metoprolol  tartrate (LOPRESSOR ) 50 MG tablet TAKE 1 TABLET BY MOUTH TWICE A DAY 07/24/23  Yes Nahser, Aleene PARAS, MD  nitroGLYCERIN  (NITROSTAT ) 0.4 MG SL tablet Place 1 tablet (0.4 mg total) under the tongue every 5 (five) minutes as needed for chest pain. 10/03/22  Yes Nahser, Aleene PARAS, MD  omeprazole (PRILOSEC) 20 MG capsule Take 20 mg by  mouth daily after breakfast.   Yes [provider]  predniSONE  (DELTASONE ) 5 MG tablet Take 5 mg by mouth daily with breakfast.   Yes [provider]  tacrolimus  (PROGRAF ) 1 MG capsule Take 4 mg by mouth See admin instructions. Take 4 mg by mouth at 9 AM and 4 mg at 9 PM   Yes [provider]  Tiotropium Bromide  Monohydrate 1.25 MCG/ACT AERS Inhale 1 puff into the lungs in the morning and at bedtime. 10/23/17  Yes [provider]  torsemide  (DEMADEX ) 20 MG tablet Take 1 tablet (20 mg total) by mouth daily. 04/23/24 07/22/24 Yes Dahal, Chapman, MD  Azelastine  HCl 0.15 % SOLN Place 1 spray into both nostrils daily as needed for allergies. Patient not taking: Reported on 05/24/2024    [provider]  ONETOUCH VERIO test strip 1 EACH BY OTHER ROUTE DAILY IN THE AFTERNOON. USE AS INSTRUCTED 07/02/23   Shamleffer, Donell Cardinal, MD     Critical care time: 35 minutes     JD Emilio RIGGERS [facility] 05/27/2024, 7:17 AM  Please see [email or web address] for pager details.  From 7A-7P if no response, please call [phone]. After hours, please call ELink [phone].

## 2024-05-27 NOTE — Plan of Care (Signed)
  Problem: Clinical Measurements: Goal: Will remain free from infection Outcome: Progressing  Covid positive, no hospital acquired infections. Afebrile, WBC stable. Problem: Nutrition: Goal: Adequate nutrition will be maintained Outcome: Progressing Started on solid foods today, tolerating well.

## 2024-05-27 NOTE — Plan of Care (Signed)
 This patient remains on MC-4N-ICU as of time of writing. The patient is AA+Ox4. Fall prevention measures in place, including a bed alarm. The patient remains in Airborne/Contact isolation. The patient continues to require HHFNC O2 supplementation, currently set at 30 L/min and 70% FiO2. The patient desaturates quickly into low 80s per SPO2 with minor conversation or bed mobility, and further upon standing. Per nursing handoff, the patient often required NRB mask/100% FiO2 supplementation with any activity throughout the day to prevent life-threatening hypoxia, which (again per handoff) was noted to be as low as the 60% range today. Poor PO intake for food and fluid and the patient endorses loss of appetite; oral intake encouraged. The patient also continues to present with frequent, loose stools.    Problem: Education: Goal: Knowledge of risk factors and measures for prevention of condition will improve Outcome: Progressing   Problem: Coping: Goal: Psychosocial and spiritual needs will be supported Outcome: Progressing   Problem: Respiratory: Goal: Will maintain a patent airway Outcome: Progressing Goal: Complications related to the disease process, condition or treatment will be avoided or minimized Outcome: Progressing   Problem: Education: Goal: Knowledge of General Education information will improve Description: Including pain rating scale, medication(s)/side effects and non-pharmacologic comfort measures Outcome: Progressing   Problem: Health Behavior/Discharge Planning: Goal: Ability to manage health-related needs will improve Outcome: Progressing   Problem: Clinical Measurements: Goal: Ability to maintain clinical measurements within normal limits will improve Outcome: Progressing Goal: Will remain free from infection Outcome: Progressing Goal: Diagnostic test results will improve Outcome: Progressing Goal: Respiratory complications will improve Outcome: Progressing Goal:  Cardiovascular complication will be avoided Outcome: Progressing   Problem: Activity: Goal: Risk for activity intolerance will decrease Outcome: Progressing   Problem: Nutrition: Goal: Adequate nutrition will be maintained Outcome: Progressing   Problem: Coping: Goal: Level of anxiety will decrease Outcome: Progressing   Problem: Elimination: Goal: Will not experience complications related to bowel motility Outcome: Progressing Goal: Will not experience complications related to urinary retention Outcome: Progressing   Problem: Pain Managment: Goal: General experience of comfort will improve and/or be controlled Outcome: Progressing   Problem: Safety: Goal: Ability to remain free from injury will improve Outcome: Progressing   Problem: Skin Integrity: Goal: Risk for impaired skin integrity will decrease Outcome: Progressing

## 2024-05-27 NOTE — Progress Notes (Signed)
 PT Cancellation Note  Patient Details Name: [name] MRN: [id number] DOB: Jul 28, 1941   Cancelled Treatment:    Reason Eval/Treat Not Completed: Medical issues which prohibited therapy (pt desatting to 70s with RN during pivot transfer, RN asking to hold at this time, will follow up next date as schedule permits).   [name] 05/27/2024, 1:08 PM

## 2024-05-28 DIAGNOSIS — N179 Acute kidney failure, unspecified: Secondary | ICD-10-CM | POA: Diagnosis not present

## 2024-05-28 DIAGNOSIS — U071 COVID-19: Secondary | ICD-10-CM | POA: Diagnosis not present

## 2024-05-28 DIAGNOSIS — J9601 Acute respiratory failure with hypoxia: Secondary | ICD-10-CM | POA: Diagnosis not present

## 2024-05-28 DIAGNOSIS — J1282 Pneumonia due to coronavirus disease 2019: Secondary | ICD-10-CM | POA: Diagnosis not present

## 2024-05-28 LAB — CBC
HCT: 33.7 % — ABNORMAL LOW (ref 36.0–46.0)
Hemoglobin: 11.6 g/dL — ABNORMAL LOW (ref 12.0–15.0)
MCH: 25.1 pg — ABNORMAL LOW (ref 26.0–34.0)
MCHC: 34.4 g/dL (ref 30.0–36.0)
MCV: 72.9 fL — ABNORMAL LOW (ref 80.0–100.0)
Platelets: 160 K/uL (ref 150–400)
RBC: 4.62 MIL/uL (ref 3.87–5.11)
RDW: 19.2 % — ABNORMAL HIGH (ref 11.5–15.5)
WBC: 8.7 K/uL (ref 4.0–10.5)
nRBC: 0.5 % — ABNORMAL HIGH (ref 0.0–0.2)

## 2024-05-28 LAB — GLUCOSE, CAPILLARY
Glucose-Capillary: 104 mg/dL — ABNORMAL HIGH (ref 70–99)
Glucose-Capillary: 106 mg/dL — ABNORMAL HIGH (ref 70–99)
Glucose-Capillary: 129 mg/dL — ABNORMAL HIGH (ref 70–99)
Glucose-Capillary: 132 mg/dL — ABNORMAL HIGH (ref 70–99)
Glucose-Capillary: 50 mg/dL — ABNORMAL LOW (ref 70–99)
Glucose-Capillary: 78 mg/dL (ref 70–99)
Glucose-Capillary: 88 mg/dL (ref 70–99)
Glucose-Capillary: 89 mg/dL (ref 70–99)

## 2024-05-28 LAB — BASIC METABOLIC PANEL WITH GFR
Anion gap: 7 (ref 5–15)
BUN: 28 mg/dL — ABNORMAL HIGH (ref 8–23)
CO2: 22 mmol/L (ref 22–32)
Calcium: 8 mg/dL — ABNORMAL LOW (ref 8.9–10.3)
Chloride: 107 mmol/L (ref 98–111)
Creatinine, Ser: 1.46 mg/dL — ABNORMAL HIGH (ref 0.44–1.00)
GFR, Estimated: 36 mL/min — ABNORMAL LOW (ref >60)
Glucose, Bld: 128 mg/dL — ABNORMAL HIGH (ref 70–99)
Potassium: 4.2 mmol/L (ref 3.5–5.1)
Sodium: 136 mmol/L (ref 135–145)

## 2024-05-28 LAB — LEGIONELLA PNEUMOPHILA SEROGP 1 UR AG: L. pneumophila Serogp 1 Ur Ag: NEGATIVE

## 2024-05-28 MED ORDER — DEXTROSE 50 % IV SOLN
INTRAVENOUS | Status: AC
  Filled 2024-05-28: qty 50

## 2024-05-28 MED ORDER — LOPERAMIDE HCL 2 MG PO CAPS
2.0000 mg | ORAL_CAPSULE | ORAL | Status: DC | PRN
  Administered 2024-05-28: 4 mg via ORAL
  Administered 2024-05-28 – 2024-05-31 (×2): 2 mg via ORAL
  Administered 2024-06-01 – 2024-06-05 (×3): 4 mg via ORAL
  Filled 2024-05-28 (×7): qty 2

## 2024-05-28 MED ORDER — ORAL CARE MOUTH RINSE: 15.0000 mL | OROMUCOSAL | Status: DC | PRN

## 2024-05-28 MED ORDER — GERHARDT'S BUTT CREAM
TOPICAL_CREAM | Freq: Two times a day (BID) | CUTANEOUS | Status: AC
  Filled 2024-05-28: qty 60

## 2024-05-28 MED ORDER — GLUCOSE 40 % PO GEL
2.0000 | ORAL | Status: AC
Start: 2024-05-28 — End: 2024-05-28
  Administered 2024-05-28: 62 g via ORAL
  Filled 2024-05-28: qty 2.42

## 2024-05-28 MED ORDER — PIPERACILLIN-TAZOBACTAM 3.375 G IVPB
3.3750 g | Freq: Three times a day (TID) | INTRAVENOUS | Status: AC
  Administered 2024-05-28 – 2024-05-30 (×8): 3.375 g via INTRAVENOUS
  Filled 2024-05-28 (×7): qty 50

## 2024-05-28 NOTE — Progress Notes (Signed)
 OT Cancellation Note  Patient Details Name: [name] MRN: [id number] DOB: [birth year]   Cancelled Treatment:    Reason Eval/Treat Not Completed: Medical issues which prohibited therapy (pt desats with bed mobility and minor conversation, RN asking to hold OT evaluation at this time. Will follow up next date as schedule permits)  Analilia Geddis K, OTD, OTR/L [facility] [phone]   [name] 05/28/2024, 9:14 AM

## 2024-05-28 NOTE — Progress Notes (Signed)
 PT Cancellation Note  Patient Details Name: [name] MRN: [id number] DOB: [birth year]   Cancelled Treatment:    Reason Eval/Treat Not Completed: Medical issues which prohibited therapy (pt desats with bed mobility and minor conversation, RN asking to hold PT evaluation at this time. Will follow up next date as schedule permits)    [name] 05/28/2024, 9:34 AM

## 2024-05-28 NOTE — Progress Notes (Signed)
 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 No f/c/r, LL edema, CP, N/V, abd pain, rash, melena, or blood per rectum. Given Lasix  120 mg, Solumedrol 125 mg, Zosyn , and Linezolid . Reportedly oxygen  saturation was around 55% on 4 L, treated with a DuoNeb with improvement of saturation to 83% on NRM. On BiPAP 08/25/59%, RR 15, saturating at 95%. COVID positive.   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 On BiPAP 08/25/59%, RR 15, saturating at 95%. 05/24/2024: will admit to ICU 7/7 placed on heated HHFNC , off bipap  Interim History / Subjective:    Continues to desaturate with activity and needs NRB in addition to HHFNC. Down to 65% / 30 L Afebrile  Objective    Blood pressure 132/74, pulse 64, temperature 98.2 F (36.8 C), temperature source Oral, resp. rate 19, height 5' 2 (1.575 m), weight 50.6 kg, last menstrual period 03/04/2022, SpO2 97%.    FiO2 (%):  [70  %-100 %] 100 %   Intake/Output Summary (Last 24 hours) at 05/28/2024 0916 Last data filed at 05/28/2024 0900 Gross per 24 hour  Intake 1456.55 ml  Output 400 ml  Net 1056.55 ml   Filed Weights   05/24/24 1326 05/25/24 0945 05/28/24 0345  Weight: 54.4 kg 51.7 kg 50.6 kg    Examination: General: Chronically ill appearing female on HHFNC HEENT: MM pink/moist; HHFNC in place Neuro: Alert, interactive, nonfocal CV: s1s2, RRR, no m/r/g PULM: Clear breath sounds bilateral, no accessory muscle use, able to lie supine GI: soft, bsx4 active  Extremities: warm/dry, no edema   Labs show normal electrolytes, BUN/creatinine decreased to 28/1.4, no leukocytosis  Resolved problem list   Assessment and Plan  Acute on chronic hypoxic respiratory failure due to COVID PNA -on 4-6 L [place] at home Hx of COPD/asthma Plan: -cont Heated HFNC wean for sats >90% -NRB as needed during exertion to avoid desats -cont baricitinib  and dexamethasone ; airborne/contact precautions -follow bc, rvp, expectorated sputum -check urine legionella; strep negative -cont brovana /yupelri /pulmicort ; prn duoneb -currently on Zosyn -plan for 7 days; mrsa pcr negative linezolid  dc'd yesterday -pulm toiletry: is/flutter -pt/ot - Encourage self proning    AKI/CKD-3a: Likely due to ischemic ATN, now resolved and creatinine back to baseline Hx of kidney transplant in 2012  Plan: -nephro following; appreciate recs -Trend BMP / urinary output -Avoid nephrotoxic agents, ensure adequate renal perfusion -cont prograf ; back on prednisone  5 mg been Decadron  off  HTN Plan: -cont home metoprolol  -hold imdur  and norvasc  for now -prn labetalol   CAD/CABG, D-CHF (EF 60% with 2 G2DD) Plan: -daily  weights; strict I/o's -consider diuresis -asa/zetia   DM-2 -A1c 6.8 on 7/6 Plan: -cont basal and ssi  -cbg monitoring  GERD Plan: -H2B  Chronic thrombocytopenia Plan: -trend cbc , improved  Summary -hypoxia is gradually  improving, hopefully can transition to salter once FiO2 further decreases  Best Practice (right click and Reselect all SmartList Selections daily)   Diet/type: Regular consistency (see orders) DVT prophylaxis prophylactic heparin   Pressure ulcer(s): N/A GI prophylaxis: H2B Lines: N/A Foley:  N/A Code Status:  full code Last date of multidisciplinary goals of care discussion [7/7 patient updated at bedside]  Labs   CBC: Recent Labs  Lab 05/24/24 1331 05/24/24 1453 05/25/24 0550 05/26/24 0746 05/27/24 0648 05/28/24 0513  WBC 12.3*  --  6.7 13.3* 10.3 8.7  NEUTROABS 9.7*  --  5.1  --   --   --   HGB 11.9* 16.7* 11.2* 11.2* 10.4* 11.6*  HCT 33.2* 49.0* 32.3* 33.0* 30.1* 33.7*  MCV 73.8*  --  72.6* 72.2* 72.2* 72.9*  PLT 131*  --  126* 145* 151 160    Basic Metabolic Panel: Recent Labs  Lab 05/24/24 1800 05/25/24 0550 05/26/24 0746 05/26/24 0829 05/27/24 0648 05/28/24 0513  NA 138 142 143  --  142 136  K 4.9 4.3 4.5  --  5.1 4.2  CL 102 107 107  --  111 107  CO2 16* 21* 18*  --  21* 22  GLUCOSE 327* 149* 159*  --  129* 128*  BUN 46* 49* 44*  --  41* 28*  CREATININE 2.94* 2.71* 1.99*  --  1.72* 1.46*  CALCIUM  8.9 8.4* 8.5*  --  8.6* 8.0*  MG 2.7* 2.4  --  2.3 2.4  --   PHOS 4.3 4.1  --   --  4.3  --    GFR: Estimated Creatinine Clearance: 23.9 mL/min (A) (by C-G formula based on SCr of 1.46 mg/dL (H)). Recent Labs  Lab 05/24/24 1814 05/25/24 0550 05/26/24 0746 05/26/24 0805 05/27/24 0648 05/28/24 0513  PROCALCITON  --   --   --  1.27  --   --   WBC  --  6.7 13.3*  --  10.3 8.7  LATICACIDVEN 8.0* 2.9*  --   --  2.0*  --     Liver Function Tests: Recent Labs  Lab 05/24/24 1800 05/25/24 0550  AST 31  --   ALT 13  --   ALKPHOS 47  --   BILITOT 0.7  --   PROT 6.0*  --   ALBUMIN 2.6*  2.6* 2.5*   No results for input(s): LIPASE, AMYLASE in the last 168 hours. No results for input(s): AMMONIA in the last 168 hours.  ABG    Component Value  Date/Time   PHART 7.406 (H) 10/16/2011 1057   PCO2ART 36.2 10/16/2011 1057   PO2ART 102.0 (H) 10/16/2011 1057   HCO3 18.3 (L) 05/24/2024 2100   TCO2 26 05/24/2024 1453   ACIDBASEDEF 7.9 (H) 05/24/2024 2100   O2SAT 78.2 05/24/2024 2100     Coagulation Profile: No results for input(s): INR, PROTIME in the last 168 hours.  Cardiac Enzymes: No results for input(s): CKTOTAL, CKMB, CKMBINDEX, TROPONINI in the last 168 hours.  HbA1C: Hemoglobin A1C  Date/Time Value Ref Range Status  10/24/2023 10:31 AM 6.1 (A) 4.0 - 5.6 % Final  01/01/2023 10:01 AM 6.0 (A) 4.0 - 5.6 % Final   Hgb A1c MFr Bld  Date/Time Value Ref Range Status  05/25/2024 05:50 AM 6.8 (  H) 4.8 - 5.6 % Final    Comment:    (NOTE) Diagnosis of Diabetes The following HbA1c ranges recommended by the American Diabetes Association (ADA) may be used as an aid in the diagnosis of diabetes mellitus.  Hemoglobin             Suggested A1C NGSP%              Diagnosis  <5.7                   Non Diabetic  5.7-6.4                Pre-Diabetic  >6.4                   Diabetic  <7.0                   Glycemic control for                       adults with diabetes.    02/13/2018 03:05 AM 5.7 (H) 4.8 - 5.6 % Final    Comment:    (NOTE) Pre diabetes:          5.7%-6.4% Diabetes:              >6.4% Glycemic control for   <7.0% adults with diabetes     CBG: Recent Labs  Lab 05/27/24 1622 05/27/24 1957 05/28/24 0016 05/28/24 0347 05/28/24 0805  GLUCAP 77 111* 104* 129* 106*   Critical care time: 31 minutes     Khaalid Lefkowitz V. Jude MD [facility] 05/28/2024, 9:16 AM  Please see [email or web address] for pager details.  From 7A-7P if no response, please call [phone]. After hours, please call ELink [phone].

## 2024-05-29 DIAGNOSIS — J9601 Acute respiratory failure with hypoxia: Secondary | ICD-10-CM | POA: Diagnosis not present

## 2024-05-29 DIAGNOSIS — U071 COVID-19: Secondary | ICD-10-CM | POA: Diagnosis not present

## 2024-05-29 DIAGNOSIS — N179 Acute kidney failure, unspecified: Secondary | ICD-10-CM | POA: Diagnosis not present

## 2024-05-29 DIAGNOSIS — J1282 Pneumonia due to coronavirus disease 2019: Secondary | ICD-10-CM | POA: Diagnosis not present

## 2024-05-29 LAB — BASIC METABOLIC PANEL WITH GFR
Anion gap: 13 (ref 5–15)
BUN: 21 mg/dL (ref 8–23)
CO2: 20 mmol/L — ABNORMAL LOW (ref 22–32)
Calcium: 8.7 mg/dL — ABNORMAL LOW (ref 8.9–10.3)
Chloride: 105 mmol/L (ref 98–111)
Creatinine, Ser: 1.43 mg/dL — ABNORMAL HIGH (ref 0.44–1.00)
GFR, Estimated: 37 mL/min — ABNORMAL LOW (ref >60)
Glucose, Bld: 148 mg/dL — ABNORMAL HIGH (ref 70–99)
Potassium: 4.9 mmol/L (ref 3.5–5.1)
Sodium: 138 mmol/L (ref 135–145)

## 2024-05-29 LAB — CBC
HCT: 32.8 % — ABNORMAL LOW (ref 36.0–46.0)
Hemoglobin: 11.2 g/dL — ABNORMAL LOW (ref 12.0–15.0)
MCH: 24.7 pg — ABNORMAL LOW (ref 26.0–34.0)
MCHC: 34.1 g/dL (ref 30.0–36.0)
MCV: 72.4 fL — ABNORMAL LOW (ref 80.0–100.0)
Platelets: 150 K/uL (ref 150–400)
RBC: 4.53 MIL/uL (ref 3.87–5.11)
RDW: 18.7 % — ABNORMAL HIGH (ref 11.5–15.5)
WBC: 8.6 K/uL (ref 4.0–10.5)
nRBC: 0.4 % — ABNORMAL HIGH (ref 0.0–0.2)

## 2024-05-29 LAB — GLUCOSE, CAPILLARY
Glucose-Capillary: 122 mg/dL — ABNORMAL HIGH (ref 70–99)
Glucose-Capillary: 198 mg/dL — ABNORMAL HIGH (ref 70–99)
Glucose-Capillary: 249 mg/dL — ABNORMAL HIGH (ref 70–99)
Glucose-Capillary: 67 mg/dL — ABNORMAL LOW (ref 70–99)
Glucose-Capillary: 63 mg/dL — ABNORMAL LOW (ref 70–99)
Glucose-Capillary: 91 mg/dL (ref 70–99)

## 2024-05-29 NOTE — Evaluation (Signed)
 Occupational Therapy Evaluation Patient Details Name: [name] MRN: [id number] DOB: 1941/10/17 Today's Date: 05/29/2024   History of Present Illness   Pt is an 83 y.o. F presenting to [facility] on 05/24/24 w/ dyspnea, dry cough, wheezing, and diarrhea for 3 days. Pt admitted w/ acute on chronic hypoxic respiratory failure due to COVID. 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     Clinical Impressions Patient admitted for the diagnosis above.  PTA she lives at home with her grandson and his SO.  Patient stating she has someone in the home at all times if needed.  Walks with a 4wrw and needs only extra time for ADL and iADL completion.  SOB is the deficit.  Currently she is needing generalized supervision for short bouts of mobility and Min a for LB ADL due to DOE.  OT will follow in the acute setting and HH OT is recommended.  Patient was receiving HH OT and PT prior to admit.       If plan is discharge home, recommend the following:   Assist for transportation;A little help with walking and/or transfers;A little help with bathing/dressing/bathroom     Functional Status Assessment   Patient has had a recent decline in their functional status and demonstrates the ability to make significant improvements in function in a reasonable and predictable amount of time.     Equipment Recommendations   None recommended by OT     Recommendations for Other Services         Precautions/Restrictions   Precautions Precautions: Fall;Other (comment) (O2 sats) Precaution/Restrictions Comments: non re-breather at bedside Restrictions Weight Bearing Restrictions Per Provider Order: No     Mobility Bed Mobility Overal bed mobility: Modified Independent                  Transfers Overall transfer level: Needs assistance Equipment used: Rolling walker (2 wheels) Transfers: Sit to/from  Stand, Bed to chair/wheelchair/BSC Sit to Stand: Supervision     Step pivot transfers: Supervision            Balance Overall balance assessment: Needs assistance Sitting-balance support: Feet supported Sitting balance-Leahy Scale: Good     Standing balance support: Reliant on assistive device for balance Standing balance-Leahy Scale: Fair                             ADL either performed or assessed with clinical judgement   ADL       Grooming: Supervision/safety;Sitting       Lower Body Bathing: Minimal assistance;Sit to/from stand       Lower Body Dressing: Minimal assistance;Sit to/from stand   Toilet Transfer: Supervision/safety;Rolling walker (2 wheels)                   Vision Patient Visual Report: No change from baseline       Perception Perception: Not tested       Praxis Praxis: Not tested       Pertinent Vitals/Pain Pain Assessment Pain Assessment: No/denies pain     Extremity/Trunk Assessment Upper Extremity Assessment Upper Extremity Assessment: Overall WFL for tasks assessed   Lower Extremity Assessment Lower Extremity Assessment: Defer to PT evaluation   Cervical / Trunk Assessment Cervical / Trunk Assessment: Kyphotic   Communication Communication Communication: No apparent difficulties   Cognition Arousal: Alert Behavior During Therapy: [facility] for tasks assessed/performed  Cognition: No apparent impairments                               Following commands: Intact       Cueing  General Comments   Cueing Techniques: Verbal cues      Exercises     Shoulder Instructions      Home Living Family/patient expects to be discharged to:: Private residence Living Arrangements: Children Available Help at Discharge: Family;Available 24 hours/day Type of Home: House Home Access: Ramped entrance     Home Layout: One level     Bathroom Shower/Tub: Chief Strategy Officer:  Handicapped height Bathroom Accessibility: Yes   Home Equipment: Hand held shower head;Cane - single point;Rollator (4 wheels);Other (comment);Shower seat;BSC/3in1;Electric scooter   Additional Comments: Reports she just recently got an Art gallery manager      Prior Functioning/Environment Prior Level of Function : Independent/Modified Independent             Mobility Comments: Patient uses 4WW for household ambulation and occasionally uses electric scooter. Has home O2 and portable tank. ADLs Comments: Ind with ADLs/selfcare, cooks, IT sales professional. Grandson will assist with tub shower transfers. Pt reports that ADLs requring extra time to complete due to SOB and fatigue    OT Problem List: Decreased activity tolerance   OT Treatment/Interventions: Self-care/ADL training;Therapeutic activities;Patient/family education;Balance training;DME and/or AE instruction      OT Goals(Current goals can be found in the care plan section)   Acute Rehab OT Goals Patient Stated Goal: Return home OT Goal Formulation: With patient Time For Goal Achievement: 06/12/24 Potential to Achieve Goals: Good   OT Frequency:  Min 2X/week    Co-evaluation              AM-PAC OT 6 Clicks Daily Activity     Outcome Measure Help from another person eating meals?: None Help from another person taking care of personal grooming?: A Little Help from another person toileting, which includes using toliet, bedpan, or urinal?: A Little Help from another person bathing (including washing, rinsing, drying)?: A Little Help from another person to put on and taking off regular upper body clothing?: None Help from another person to put on and taking off regular lower body clothing?: A Little 6 Click Score: 20   End of Session Equipment Utilized During Treatment: Rolling walker (2 wheels);Oxygen  Nurse Communication: Mobility status  Activity Tolerance: Patient tolerated treatment well Patient left: in  chair;with call bell/phone within reach  OT Visit Diagnosis: Unsteadiness on feet (R26.81)                Time: 9175-9151 OT Time Calculation (min): 24 min Charges:  OT General Charges $OT Visit: 1 Visit OT Evaluation $OT Eval Moderate Complexity: 1 Mod OT Treatments $Self Care/Home Management : 8-22 mins  05/29/2024  RP, OTR/L  Acute Rehabilitation Services  Office:  [phone]   [name] 05/29/2024, 8:55 AM

## 2024-05-29 NOTE — Progress Notes (Signed)
 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 No f/c/r, LL edema, CP, N/V, abd pain, rash, melena, or blood per rectum. Given Lasix  120 mg, Solumedrol 125 mg, Zosyn , and Linezolid . Reportedly oxygen  saturation was around 55% on 4 L, treated with a DuoNeb with improvement of saturation to 83% on NRM. On BiPAP 08/25/59%, RR 15, saturating at 95%. COVID positive.   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 On BiPAP 08/25/59%, RR 15, saturating at 95%. 05/24/2024: will admit to ICU 7/7 placed on heated HHFNC , off bipap  Interim History / Subjective:  Remains critically ill Continues to desaturate with activity and needs NRB in addition to [facility]. Down to 60% / 25 L Afebrile  Objective    Blood pressure (!) 137/98, pulse 72, temperature 98.1 F (36.7 C), temperature source Oral, resp. rate (!) 25, height 5' 2 (1.575 m), weight 50.9 kg, last menstrual period 03/04/2022, SpO2  91%.    FiO2 (%):  [60 %-70 %] 60 %   Intake/Output Summary (Last 24 hours) at 05/29/2024 0954 Last data filed at 05/29/2024 0600 Gross per 24 hour  Intake 850.28 ml  Output 400 ml  Net 450.28 ml   Filed Weights   05/25/24 0945 05/28/24 0345 05/29/24 0348  Weight: 51.7 kg 50.6 kg 50.9 kg    Examination: General: Chronically ill appearing female on HHFNC HEENT: MM pink/moist; HHFNC in place Neuro: Alert, interactive, nonfocal CV: s1s2, RRR, no m/r/g PULM: No accessory muscle use, clear breath sounds bilaterally, GI: soft, bsx4 active  Extremities: warm/dry, no edema   Labs show normal electrolytes, BUN/creatinine decreased to 21/1.4  Resolved problem list   Assessment and Plan  Acute on chronic hypoxic respiratory failure due to COVID PNA -on 4-6 L [place] at home , urine legionella; strep negative Hx of COPD/asthma Plan: -cont Heated HFNC wean for sats >90% , switch to salter at some point -NRB as needed during exertion to avoid desats -cont baricitinib  and dexamethasone  x 10 ds; airborne/contact precautions -cont brovana /yupelri /pulmicort ; prn duoneb - Zosyn -plan for 7 days; mrsa pcr negative -pulm toiletry: is/flutter -pt/ot - Encourage self proning    AKI/CKD-3a: Likely due to ischemic ATN, now resolved and creatinine back to baseline Hx of kidney transplant in 2012  Plan: -nephro s/o -Trend BMP / urinary output -Avoid nephrotoxic agents, ensure adequate renal perfusion -cont prograf ; put back on prednisone  5 mg once Decadron  off  HTN Plan: -cont home metoprolol  -hold imdur  and norvasc  for now -prn labetalol   CAD/CABG, D-CHF (EF 60% with 2 G2DD) Plan: -daily weights; strict I/o's -  asa/zetia   DM-2 , worsened by steroids -A1c 6.8 on 7/6 Plan: -cont  ssi , DC Semglee  due to hypoglycemia -cbg monitoring  GERD Plan: -H2B  Chronic thrombocytopenia Plan: -trend cbc , improved  Summary -hypoxia is gradually improving, hopefully can transition to  salter once FiO2 further decreases  Best Practice (right click and Reselect all SmartList Selections daily)   Diet/type: Regular consistency (see orders) DVT prophylaxis prophylactic heparin   Pressure ulcer(s): N/A GI prophylaxis: H2B Lines: N/A Foley:  N/A Code Status:  full code Last date of multidisciplinary goals of care discussion patient updated daily   Labs   CBC: Recent Labs  Lab 05/24/24 1331 05/24/24 1453 05/25/24 0550 05/26/24 0746 05/27/24 0648 05/28/24 0513  WBC 12.3*  --  6.7 13.3* 10.3 8.7  NEUTROABS 9.7*  --  5.1  --   --   --   HGB 11.9* 16.7* 11.2* 11.2* 10.4* 11.6*  HCT 33.2* 49.0* 32.3* 33.0* 30.1* 33.7*  MCV 73.8*  --  72.6* 72.2* 72.2* 72.9*  PLT 131*  --  126* 145* 151 160    Basic Metabolic Panel: Recent Labs  Lab 05/24/24 1800 05/25/24 0550 05/26/24 0746 05/26/24 0829 05/27/24 0648 05/28/24 0513 05/29/24 0656  NA 138 142 143  --  142 136 138  K 4.9 4.3 4.5  --  5.1 4.2 4.9  CL 102 107 107  --  111 107 105  CO2 16* 21* 18*  --  21* 22 20*  GLUCOSE 327* 149* 159*  --  129* 128* 148*  BUN 46* 49* 44*  --  41* 28* 21  CREATININE 2.94* 2.71* 1.99*  --  1.72* 1.46* 1.43*  CALCIUM  8.9 8.4* 8.5*  --  8.6* 8.0* 8.7*  MG 2.7* 2.4  --  2.3 2.4  --   --   PHOS 4.3 4.1  --   --  4.3  --   --    GFR: Estimated Creatinine Clearance: 24.4 mL/min (A) (by C-G formula based on SCr of 1.43 mg/dL (H)). Recent Labs  Lab 05/24/24 1814 05/25/24 0550 05/26/24 0746 05/26/24 0805 05/27/24 0648 05/28/24 0513  PROCALCITON  --   --   --  1.27  --   --   WBC  --  6.7 13.3*  --  10.3 8.7  LATICACIDVEN 8.0* 2.9*  --   --  2.0*  --     Liver Function Tests: Recent Labs  Lab 05/24/24 1800 05/25/24 0550  AST 31  --   ALT 13  --   ALKPHOS 47  --   BILITOT 0.7  --   PROT 6.0*  --   ALBUMIN 2.6*  2.6* 2.5*   No results for input(s): LIPASE, AMYLASE in the last 168 hours. No results for input(s): AMMONIA in the last 168 hours.  ABG     Component Value Date/Time   PHART 7.406 (H) 10/16/2011 1057   PCO2ART 36.2 10/16/2011 1057   PO2ART 102.0 (H) 10/16/2011 1057   HCO3 18.3 (L) 05/24/2024 2100   TCO2 26 05/24/2024 1453   ACIDBASEDEF 7.9 (H) 05/24/2024 2100   O2SAT 78.2 05/24/2024 2100     Coagulation Profile: No results for input(s): INR, PROTIME in the last 168 hours.  Cardiac Enzymes: No results for input(s): CKTOTAL, CKMB, CKMBINDEX, TROPONINI in the last 168 hours.  HbA1C: Hemoglobin A1C  Date/Time Value Ref Range Status  10/24/2023 10:31 AM 6.1 (A) 4.0 - 5.6 % Final  01/01/2023 10:01 AM 6.0 (A) 4.0 - 5.6 %  Final   Hgb A1c MFr Bld  Date/Time Value Ref Range Status  05/25/2024 05:50 AM 6.8 (H) 4.8 - 5.6 % Final    Comment:    (NOTE) Diagnosis of Diabetes The following HbA1c ranges recommended by the American Diabetes Association (ADA) may be used as an aid in the diagnosis of diabetes mellitus.  Hemoglobin             Suggested A1C NGSP%              Diagnosis  <5.7                   Non Diabetic  5.7-6.4                Pre-Diabetic  >6.4                   Diabetic  <7.0                   Glycemic control for                       adults with diabetes.    02/13/2018 03:05 AM 5.7 (H) 4.8 - 5.6 % Final    Comment:    (NOTE) Pre diabetes:          5.7%-6.4% Diabetes:              >6.4% Glycemic control for   <7.0% adults with diabetes     CBG: Recent Labs  Lab 05/28/24 2038 05/28/24 2124 05/28/24 2325 05/29/24 0346 05/29/24 0801  GLUCAP 50* 88 78 122* 198*   Critical care time: 32 minutes     Valisa Karpel V. Jude MD [facility] 05/29/2024, 9:54 AM  Please see [email or web address] for pager details.  From 7A-7P if no response, please call [phone]. After hours, please call ELink [phone].

## 2024-05-29 NOTE — Plan of Care (Signed)
   Problem: Education: Goal: Knowledge of risk factors and measures for prevention of condition will improve Outcome: Progressing   Problem: Coping: Goal: Psychosocial and spiritual needs will be supported Outcome: Progressing   Problem: Respiratory: Goal: Will maintain a patent airway Outcome: Progressing Goal: Complications related to the disease process, condition or treatment will be avoided or minimized Outcome: Progressing

## 2024-05-29 NOTE — Evaluation (Signed)
 Physical Therapy Evaluation Patient Details Name: [name] MRN: [id number] DOB: [birth year] Today's Date: 05/29/2024  History of Present Illness  Pt is an 83 y.o. F presenting to [facility] on 05/24/24 w/ dyspnea, dry cough, wheezing, and diarrhea for 3 days. Pt admitted w/ acute on chronic hypoxic respiratory failure due to COVID. 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  Clinical Impression   Pt admitted secondary to problem above with deficits below. PTA patient lives in ramped home and stays on first level. She uses a rollator to ambulate inside home and electric scooter outside home. Pt currently is limited by decr respiratory status with drop in sats to 83% while on HHFNC during chair to bed transfer. Once seated and educated in pursed lip breathing, she recovered in 45 seconds. Anticipate patient will benefit from PT to address problems listed below. Will continue to follow acutely to maximize functional mobility, independence, and safety.  Anticipate pt will benefit from HHPT on discharge.          If plan is discharge home, recommend the following: A little help with walking and/or transfers;Help with stairs or ramp for entrance   Can travel by private vehicle        Equipment Recommendations None recommended by PT  Recommendations for Other Services       Functional Status Assessment Patient has had a recent decline in their functional status and demonstrates the ability to make significant improvements in function in a reasonable and predictable amount of time.     Precautions / Restrictions Precautions Precautions: Fall;Other (comment) (O2 sats) Precaution/Restrictions Comments: HHFNC Restrictions Weight Bearing Restrictions Per Provider Order: No      Mobility  Bed Mobility Overal bed mobility: Modified Independent             General bed mobility comments: sit to  supine    Transfers Overall transfer level: Needs assistance Equipment used: Rolling walker (2 wheels) Transfers: Sit to/from Stand, Bed to chair/wheelchair/BSC Sit to Stand: Contact guard assist   Step pivot transfers: Contact guard assist       General transfer comment: guarding assist due to incr # lines/tubes (in ICU with HHFNC). Pt getting RW tangled in cords as she turned.    Ambulation/Gait               General Gait Details: deferred due to quickly desats to 83% on [facility]  Stairs            Wheelchair Mobility     Tilt Bed    Modified Rankin (Stroke Patients Only)       Balance Overall balance assessment: Needs assistance Sitting-balance support: Feet supported Sitting balance-Leahy Scale: Good     Standing balance support: Reliant on assistive device for balance Standing balance-Leahy Scale: Fair                               Pertinent Vitals/Pain Pain Assessment Pain Assessment: No/denies pain    Home Living Family/patient expects to be discharged to:: Private residence Living Arrangements: Children Available Help at Discharge: Family;Available 24 hours/day Type of Home: House Home Access: Ramped entrance       Home Layout: One level Home Equipment: Hand held shower head;Cane - single point;Rollator (4 wheels);Other (comment);Shower seat;BSC/3in1;Electric scooter Additional Comments: 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 Has home O2 and portable tank. ADLs Comments: Ind with ADLs/selfcare, cooks, IT sales professional. Grandson will assist with tub shower transfers. Pt reports that ADLs requring extra time to complete due to SOB and fatigue     Extremity/Trunk Assessment   Upper Extremity Assessment Upper Extremity  Assessment: Defer to OT evaluation    Lower Extremity Assessment Lower Extremity Assessment: Generalized weakness    Cervical / Trunk Assessment Cervical / Trunk Assessment: Kyphotic  Communication   Communication Communication: No apparent difficulties    Cognition Arousal: Alert Behavior During Therapy: WFL for tasks assessed/performed                             Following commands: Intact       Cueing Cueing Techniques: Verbal cues     General Comments      Exercises     Assessment/Plan    PT Assessment Patient needs continued PT services  PT Problem List Decreased strength;Decreased activity tolerance;Decreased balance;Decreased mobility;Decreased knowledge of use of DME;Cardiopulmonary status limiting activity       PT Treatment Interventions DME instruction;Gait training;Functional mobility training;Therapeutic activities;Therapeutic exercise;Balance training;Patient/family education    PT Goals (Current goals can be found in the Care Plan section)  Acute Rehab PT Goals Patient Stated Goal: none stated; agrees to PT goals PT Goal Formulation: With patient Time For Goal Achievement: 06/12/24 Potential to Achieve Goals: Good    Frequency Min 2X/week     Co-evaluation               AM-PAC PT 6 Clicks Mobility  Outcome Measure Help needed turning from your back to your side while in a flat bed without using bedrails?: None Help needed moving from lying on your back to sitting on the side of a flat bed without using bedrails?: None Help needed moving to and from a bed to a chair (including a wheelchair)?: A Little Help needed standing up from a chair using your arms (e.g., wheelchair or bedside chair)?: A Little Help needed to walk in hospital room?: Total Help needed climbing 3-5 steps with a railing? : Total 6 Click Score: 16    End of Session Equipment Utilized During Treatment: Oxygen  Activity Tolerance: Treatment limited  secondary to medical complications (Comment) (desaturation) Patient left: in bed;with call bell/phone within reach;with bed alarm set Nurse Communication: Mobility status PT Visit Diagnosis: Difficulty in walking, not elsewhere classified (R26.2)    Time: 9050-8989 PT Time Calculation (min) (ACUTE ONLY): 21 min   Charges:   PT Evaluation $PT Eval Low Complexity: 1 Low   PT General Charges $$ ACUTE PT VISIT: 1 Visit          Macario RAMAN, PT Acute Rehabilitation Services  Office [phone]   Macario SHAUNNA Soja 05/29/2024, 10:35 AM

## 2024-05-30 DIAGNOSIS — N179 Acute kidney failure, unspecified: Secondary | ICD-10-CM | POA: Diagnosis not present

## 2024-05-30 DIAGNOSIS — U071 COVID-19: Secondary | ICD-10-CM | POA: Diagnosis not present

## 2024-05-30 DIAGNOSIS — J9601 Acute respiratory failure with hypoxia: Secondary | ICD-10-CM | POA: Diagnosis not present

## 2024-05-30 DIAGNOSIS — J1282 Pneumonia due to coronavirus disease 2019: Secondary | ICD-10-CM | POA: Diagnosis not present

## 2024-05-30 LAB — CBC WITH DIFFERENTIAL/PLATELET
Abs Immature Granulocytes: 0.44 K/uL — ABNORMAL HIGH (ref 0.00–0.07)
Basophils Absolute: 0.1 K/uL (ref 0.0–0.1)
Basophils Relative: 1 %
Eosinophils Absolute: 0 K/uL (ref 0.0–0.5)
Eosinophils Relative: 0 %
HCT: 36.2 % (ref 36.0–46.0)
Hemoglobin: 12.1 g/dL (ref 12.0–15.0)
Immature Granulocytes: 5 %
Lymphocytes Relative: 7 %
Lymphs Abs: 0.7 K/uL (ref 0.7–4.0)
MCH: 24.7 pg — ABNORMAL LOW (ref 26.0–34.0)
MCHC: 33.4 g/dL (ref 30.0–36.0)
MCV: 73.9 fL — ABNORMAL LOW (ref 80.0–100.0)
Monocytes Absolute: 0.5 K/uL (ref 0.1–1.0)
Monocytes Relative: 6 %
Neutro Abs: 7.9 K/uL — ABNORMAL HIGH (ref 1.7–7.7)
Neutrophils Relative %: 81 %
Platelets: 160 K/uL (ref 150–400)
RBC: 4.9 MIL/uL (ref 3.87–5.11)
RDW: 19.3 % — ABNORMAL HIGH (ref 11.5–15.5)
WBC: 9.6 K/uL (ref 4.0–10.5)
nRBC: 0 % (ref 0.0–0.2)

## 2024-05-30 LAB — BASIC METABOLIC PANEL WITH GFR
Anion gap: 12 (ref 5–15)
BUN: 18 mg/dL (ref 8–23)
CO2: 20 mmol/L — ABNORMAL LOW (ref 22–32)
Calcium: 8.9 mg/dL (ref 8.9–10.3)
Chloride: 105 mmol/L (ref 98–111)
Creatinine, Ser: 1.38 mg/dL — ABNORMAL HIGH (ref 0.44–1.00)
GFR, Estimated: 38 mL/min — ABNORMAL LOW (ref >60)
Glucose, Bld: 182 mg/dL — ABNORMAL HIGH (ref 70–99)
Potassium: 4.7 mmol/L (ref 3.5–5.1)
Sodium: 137 mmol/L (ref 135–145)

## 2024-05-30 LAB — GLUCOSE, CAPILLARY
Glucose-Capillary: 112 mg/dL — ABNORMAL HIGH (ref 70–99)
Glucose-Capillary: 114 mg/dL — ABNORMAL HIGH (ref 70–99)
Glucose-Capillary: 143 mg/dL — ABNORMAL HIGH (ref 70–99)
Glucose-Capillary: 174 mg/dL — ABNORMAL HIGH (ref 70–99)
Glucose-Capillary: 198 mg/dL — ABNORMAL HIGH (ref 70–99)
Glucose-Capillary: 219 mg/dL — ABNORMAL HIGH (ref 70–99)

## 2024-05-30 LAB — PHOSPHORUS: Phosphorus: 2.8 mg/dL (ref 2.5–4.6)

## 2024-05-30 LAB — MAGNESIUM: Magnesium: 2.1 mg/dL (ref 1.7–2.4)

## 2024-05-30 MED ORDER — INSULIN ASPART 100 UNIT/ML IJ SOLN
0.0000 [IU] | INTRAMUSCULAR | Status: DC
  Administered 2024-05-30 (×2): 2 [IU] via SUBCUTANEOUS
  Administered 2024-05-30: 3 [IU] via SUBCUTANEOUS
  Administered 2024-05-31 (×2): 1 [IU] via SUBCUTANEOUS
  Administered 2024-05-31 (×3): 2 [IU] via SUBCUTANEOUS
  Administered 2024-06-01 (×2): 1 [IU] via SUBCUTANEOUS
  Administered 2024-06-01: 2 [IU] via SUBCUTANEOUS
  Administered 2024-06-02 (×2): 1 [IU] via SUBCUTANEOUS
  Administered 2024-06-02: 2 [IU] via SUBCUTANEOUS

## 2024-05-30 NOTE — Progress Notes (Signed)
 NAME:  [name], MRN:  [id number], DOB:  01-12-1941, LOS: 6 ADMISSION DATE:  05/24/2024, CONSULTATION DATE: 05/24/2024 REFERRING MD: Heddy Barren, DO, CHIEF COMPLAINT: SOB for 3 days   History of Present Illness:  A 83 year old female patient with HTN, ESRD s/p renal transplant 2012, 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 (quit smoking 20 yrs ago), CAD/CABG, with multiple recent hospitalizations for ECOPD/asthma and CHF exacerbation, D-CHF (EF 60% with 2 G2DD), DM-2, dyslipidemia, GERD, gout, CKD-3a, and chronic thrombocytopenia, who presented today for worsening dyspnea, dry cough, wheezing, and diarrhea for 3 days. No f/c/r, LL edema, CP, N/V, abd pain, rash, melena, or blood per rectum. Given Lasix  120 mg, Solumedrol 125 mg, Zosyn , and Linezolid . Reportedly oxygen  saturation was around 55% on 4 L, treated with a DuoNeb with improvement of saturation to 83% on NRM. On BiPAP 08/25/59%, RR 15, saturating at 95%. COVID positive.   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 On BiPAP 08/25/59%, RR 15, saturating at 95%. 05/24/2024: will admit to ICU 7/7 placed on heated HHFNC , off bipap  Interim History / Subjective:  Remains critically ill , on HHFNC Was up to 90% / 25 L However when sat probe changed to ear, saturation was 100%  Objective    Blood pressure (!) 128/56, pulse 71, temperature 98.7 F (37.1 C), temperature source Axillary, resp. rate 19, height 5' 2 (1.575 m), weight 51.2 kg, last menstrual period 03/04/2022, SpO2 100%.     FiO2 (%):  [60 %-70 %] 70 %   Intake/Output Summary (Last 24 hours) at 05/30/2024 0949 Last data filed at 05/30/2024 0600 Gross per 24 hour  Intake 740.05 ml  Output 850 ml  Net -109.95 ml   Filed Weights   05/28/24 0345 05/29/24 0348 05/30/24 0500  Weight: 50.6 kg 50.9 kg 51.2 kg    Examination: General: Chronically ill appearing female on HHFNC HEENT: MM pink/moist; HHFNC  Neuro: Alert, interactive, nonfocal CV: s1s2, RRR, no m/r/g PULM: No accessory muscle use, few scattered rhonchi GI: soft, bsx4 active  Extremities: warm/dry, no edema   Labs show normal electrolytes, no leukocytosis, BUN/creatinine decreased to 18/1.3  Resolved problem list   Assessment and Plan  Acute on chronic hypoxic respiratory failure due to COVID PNA -on 4-6 L [place] at home , urine legionella; strep negative Hx of COPD/asthma Plan: -cont Heated HFNC wean for sats >90% , she was switched to salter nasal cannula and seems to be holding -NRB as needed during exertion to avoid desats -cont baricitinib  and dexamethasone  x 10 ds; airborne/contact precautions -cont brovana /yupelri /pulmicort ; prn duoneb - Zosyn -plan for 7 days; mrsa pcr negative -pulm toiletry: is/flutter -pt/ot - Encourage self proning    AKI/CKD-3a: Likely due to ischemic ATN, resolved and creatinine back to baseline Hx of kidney transplant in 2012  Plan: -nephro s/o -Trend BMP / urinary output -Avoid nephrotoxic agents, ensure adequate renal perfusion -cont prograf ; can go back on prednisone  5 mg once Decadron  off  HTN Plan: -cont home metoprolol  -resume imdur  and hold norvasc  for now -prn labetalol   CAD/CABG, D-CHF (EF 60% with  2 G2DD) Plan: -daily weights; strict I/o's -asa/zetia   DM-2 , worsened by steroids -A1c 6.8 on 7/6 Plan: -cont  ssi , DC Semglee  due to hypoglycemia -cbg monitoring  GERD Plan: -H2B  Chronic thrombocytopenia Plan: -trend cbc , improved  Summary -hypoxia is gradually improving,  has transition to salter nasal cannula and if holds on this, can transfer out of ICU, baseline oxygen  requirement to 6 L  Best Practice (right click and Reselect all SmartList Selections daily)   Diet/type: Regular consistency (see orders) DVT prophylaxis prophylactic heparin   Pressure ulcer(s): N/A GI prophylaxis: H2B Lines: N/A Foley:  N/A Code Status:  full code Last date of multidisciplinary goals of care discussion patient updated daily   Labs   CBC: Recent Labs  Lab 05/24/24 1331 05/24/24 1453 05/25/24 0550 05/26/24 0746 05/27/24 0648 05/28/24 0513 05/29/24 1100 05/30/24 0543  WBC 12.3*  --  6.7 13.3* 10.3 8.7 8.6 9.6  NEUTROABS 9.7*  --  5.1  --   --   --   --  7.9*  HGB 11.9*   < > 11.2* 11.2* 10.4* 11.6* 11.2* 12.1  HCT 33.2*   < > 32.3* 33.0* 30.1* 33.7* 32.8* 36.2  MCV 73.8*  --  72.6* 72.2* 72.2* 72.9* 72.4* 73.9*  PLT 131*  --  126* 145* 151 160 150 160   < > = values in this interval not displayed.    Basic Metabolic Panel: Recent Labs  Lab 05/24/24 1800 05/25/24 0550 05/26/24 0746 05/26/24 0829 05/27/24 0648 05/28/24 0513 05/29/24 0656 05/30/24 0543  NA 138 142 143  --  142 136 138 137  K 4.9 4.3 4.5  --  5.1 4.2 4.9 4.7  CL 102 107 107  --  111 107 105 105  CO2 16* 21* 18*  --  21* 22 20* 20*  GLUCOSE 327* 149* 159*  --  129* 128* 148* 182*  BUN 46* 49* 44*  --  41* 28* 21 18  CREATININE 2.94* 2.71* 1.99*  --  1.72* 1.46* 1.43* 1.38*  CALCIUM  8.9 8.4* 8.5*  --  8.6* 8.0* 8.7* 8.9  MG 2.7* 2.4  --  2.3 2.4  --   --  2.1  PHOS 4.3 4.1  --   --  4.3  --   --  2.8   GFR: Estimated Creatinine Clearance: 25.3 mL/min (A) (by C-G formula based on SCr of 1.38 mg/dL (H)). Recent Labs  Lab 05/24/24 1814 05/25/24 0550 05/26/24 0746 05/26/24 0805 05/27/24 0648 05/28/24 0513 05/29/24 1100 05/30/24 0543  PROCALCITON  --   --   --  1.27  --   --   --   --   WBC  --  6.7   < >  --  10.3 8.7 8.6 9.6  LATICACIDVEN 8.0* 2.9*  --   --  2.0*  --   --    --    < > = values in this interval not displayed.    Liver Function Tests: Recent Labs  Lab 05/24/24 1800 05/25/24 0550  AST 31  --   ALT 13  --   ALKPHOS 47  --   BILITOT 0.7  --   PROT 6.0*  --   ALBUMIN 2.6*  2.6* 2.5*   No results for input(s): LIPASE, AMYLASE in the last 168 hours. No results for input(s): AMMONIA in the last 168 hours.  ABG    Component Value Date/Time   PHART 7.406 (H) 10/16/2011 1057   PCO2ART 36.2  10/16/2011 1057   PO2ART 102.0 (H) 10/16/2011 1057   HCO3 18.3 (L) 05/24/2024 2100   TCO2 26 05/24/2024 1453   ACIDBASEDEF 7.9 (H) 05/24/2024 2100   O2SAT 78.2 05/24/2024 2100     Coagulation Profile: No results for input(s): INR, PROTIME in the last 168 hours.  Cardiac Enzymes: No results for input(s): CKTOTAL, CKMB, CKMBINDEX, TROPONINI in the last 168 hours.  HbA1C: Hemoglobin A1C  Date/Time Value Ref Range Status  10/24/2023 10:31 AM 6.1 (A) 4.0 - 5.6 % Final  01/01/2023 10:01 AM 6.0 (A) 4.0 - 5.6 % Final   Hgb A1c MFr Bld  Date/Time Value Ref Range Status  05/25/2024 05:50 AM 6.8 (H) 4.8 - 5.6 % Final    Comment:    (NOTE) Diagnosis of Diabetes The following HbA1c ranges recommended by the American Diabetes Association (ADA) may be used as an aid in the diagnosis of diabetes mellitus.  Hemoglobin             Suggested A1C NGSP%              Diagnosis  <5.7                   Non Diabetic  5.7-6.4                Pre-Diabetic  >6.4                   Diabetic  <7.0                   Glycemic control for                       adults with diabetes.    02/13/2018 03:05 AM 5.7 (H) 4.8 - 5.6 % Final    Comment:    (NOTE) Pre diabetes:          5.7%-6.4% Diabetes:              >6.4% Glycemic control for   <7.0% adults with diabetes     CBG: Recent Labs  Lab 05/29/24 1617 05/29/24 1633 05/29/24 1714 05/30/24 0048 05/30/24 0752  GLUCAP 67* 63* 91 112* 198*   Critical care time: 31` minutes      Carley Strickling V. Jude MD [facility] 05/30/2024, 9:49 AM  Please see [email or web address] for pager details.  From 7A-7P if no response, please call [phone]. After hours, please call ELink [phone].

## 2024-05-30 NOTE — Plan of Care (Signed)
 Patient rested well overnight with no acute events vs stable A&Ox4

## 2024-05-30 NOTE — Progress Notes (Signed)
 Patient able to tolerate Christopher with salter for a few hours this AM; however O2 desaturated to mid 60's after small activity and unable to recover well. Placed on nonrebreather with sats recovering to upper 80's and then placed back on HHFNC. Patient able to tolerate ambulating to and from [facility] with HHFNC with O2 sats staying in mid 80's.

## 2024-05-30 NOTE — Progress Notes (Signed)
 Patient became very winded with sats as low as 65% while taking pills by mouth. RN placed nonrebreather on patient and sats slowly recovered to 90%. Placed back on HHFNC for now, at 65% O2 with patient sats 92%. Care ongoing.

## 2024-05-30 NOTE — Inpatient Diabetes Management (Signed)
 Inpatient Diabetes Program Recommendations  AACE/ADA: New Consensus Statement on Inpatient Glycemic Control (2015)  Target Ranges:  Prepandial:   less than 140 mg/dL      Peak postprandial:   less than 180 mg/dL (1-2 hours)      Critically ill patients:  140 - 180 mg/dL   Lab Results  Component Value Date   GLUCAP 198 (H) 05/30/2024   HGBA1C 6.8 (H) 05/25/2024    Review of Glycemic Control  Latest Reference Range & Units 05/29/24 08:01 05/29/24 11:08 05/29/24 16:17 05/29/24 16:33 05/29/24 17:14 05/30/24 00:48 05/30/24 07:52  Glucose-Capillary 70 - 99 mg/dL 801 (H) 750 (H) 67 (L) 63 (L) 91 112 (H) 198 (H)  (H): Data is abnormally high (L): Data is abnormally low Diabetes history: Type 2 DM Outpatient Diabetes medications: none Current orders for Inpatient glycemic control: Novolog  3-9 units Q$H Decadron  6 mg QD  Inpatient Diabetes Program Recommendations:    Noted hypoglycemia following correction. Consider further reducing Novolog  1-3 units Q4H. Secure chat sent to [facility].   Thanks, Tinnie Minus, MSN, RNC-OB Diabetes Coordinator [phone] (8a-5p)

## 2024-05-31 DIAGNOSIS — J1282 Pneumonia due to coronavirus disease 2019: Secondary | ICD-10-CM | POA: Diagnosis not present

## 2024-05-31 DIAGNOSIS — J9621 Acute and chronic respiratory failure with hypoxia: Secondary | ICD-10-CM | POA: Diagnosis not present

## 2024-05-31 DIAGNOSIS — U071 COVID-19: Secondary | ICD-10-CM | POA: Diagnosis not present

## 2024-05-31 DIAGNOSIS — N179 Acute kidney failure, unspecified: Secondary | ICD-10-CM | POA: Diagnosis not present

## 2024-05-31 LAB — GLUCOSE, CAPILLARY
Glucose-Capillary: 168 mg/dL — ABNORMAL HIGH (ref 70–99)
Glucose-Capillary: 177 mg/dL — ABNORMAL HIGH (ref 70–99)
Glucose-Capillary: 97 mg/dL (ref 70–99)

## 2024-05-31 LAB — CBC WITH DIFFERENTIAL/PLATELET
Abs Immature Granulocytes: 0.63 K/uL — ABNORMAL HIGH (ref 0.00–0.07)
Basophils Absolute: 0 K/uL (ref 0.0–0.1)
Basophils Relative: 1 %
Eosinophils Absolute: 0 K/uL (ref 0.0–0.5)
Eosinophils Relative: 0 %
HCT: 31.7 % — ABNORMAL LOW (ref 36.0–46.0)
Hemoglobin: 10.8 g/dL — ABNORMAL LOW (ref 12.0–15.0)
Immature Granulocytes: 8 %
Lymphocytes Relative: 9 %
Lymphs Abs: 0.7 K/uL (ref 0.7–4.0)
MCH: 24.8 pg — ABNORMAL LOW (ref 26.0–34.0)
MCHC: 34.1 g/dL (ref 30.0–36.0)
MCV: 72.9 fL — ABNORMAL LOW (ref 80.0–100.0)
Monocytes Absolute: 0.6 K/uL (ref 0.1–1.0)
Monocytes Relative: 8 %
Neutro Abs: 6 K/uL (ref 1.7–7.7)
Neutrophils Relative %: 74 %
Platelets: 152 K/uL (ref 150–400)
RBC: 4.35 MIL/uL (ref 3.87–5.11)
RDW: 19 % — ABNORMAL HIGH (ref 11.5–15.5)
Smear Review: NORMAL
WBC: 8 K/uL (ref 4.0–10.5)
nRBC: 0 % (ref 0.0–0.2)

## 2024-05-31 LAB — BASIC METABOLIC PANEL WITH GFR
Anion gap: 10 (ref 5–15)
BUN: 28 mg/dL — ABNORMAL HIGH (ref 8–23)
CO2: 26 mmol/L (ref 22–32)
Calcium: 8.9 mg/dL (ref 8.9–10.3)
Chloride: 102 mmol/L (ref 98–111)
Creatinine, Ser: 1.73 mg/dL — ABNORMAL HIGH (ref 0.44–1.00)
GFR, Estimated: 29 mL/min — ABNORMAL LOW (ref >60)
Glucose, Bld: 137 mg/dL — ABNORMAL HIGH (ref 70–99)
Potassium: 4.2 mmol/L (ref 3.5–5.1)
Sodium: 138 mmol/L (ref 135–145)

## 2024-05-31 LAB — PHOSPHORUS: Phosphorus: 3.5 mg/dL (ref 2.5–4.6)

## 2024-05-31 LAB — MAGNESIUM: Magnesium: 2.2 mg/dL (ref 1.7–2.4)

## 2024-05-31 NOTE — Progress Notes (Signed)
 NAME:  [name], MRN:  [id number], DOB:  February 18, 1941, LOS: 7 ADMISSION DATE:  05/24/2024, CONSULTATION DATE: 05/24/2024 REFERRING MD: Heddy Barren, DO, CHIEF COMPLAINT: SOB for 3 days   History of Present Illness:  A 83 year old female patient with HTN, ESRD s/p renal transplant 2012, 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 (quit smoking 20 yrs ago), CAD/CABG, with multiple recent hospitalizations for ECOPD/asthma and CHF exacerbation, D-CHF (EF 60% with 2 G2DD), DM-2, dyslipidemia, GERD, gout, CKD-3a, and chronic thrombocytopenia, who presented today for worsening dyspnea, dry cough, wheezing, and diarrhea for 3 days. No f/c/r, LL edema, CP, N/V, abd pain, rash, melena, or blood per rectum. Given Lasix  120 mg, Solumedrol 125 mg, Zosyn , and Linezolid . Reportedly oxygen  saturation was around 55% on 4 L, treated with a DuoNeb with improvement of saturation to 83% on NRM. On BiPAP 08/25/59%, RR 15, saturating at 95%. COVID positive.   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 On BiPAP 08/25/59%, RR 15, saturating at 95%. 05/24/2024: will admit to ICU 7/7 placed on heated HHFNC , off bipap   Interim History / Subjective:   She remains on high flow nasal cannula Awake alert interactive Denies any acute problems  Objective    Blood pressure (!) 147/75, pulse 73, temperature 98.9 F (37.2 C), temperature source Axillary, resp. rate 16, height 5' 2 (1.575 m), weight 51.6 kg, last menstrual period 03/04/2022, SpO2 (!) 89%.    FiO2 (%):  [50 %-65  %] 50 %   Intake/Output Summary (Last 24 hours) at 05/31/2024 1002 Last data filed at 05/30/2024 2000 Gross per 24 hour  Intake 176.52 ml  Output --  Net 176.52 ml   Filed Weights   05/29/24 0348 05/30/24 0500 05/31/24 0500  Weight: 50.9 kg 51.2 kg 51.6 kg    Examination: General: Elderly, chronically ill-appearing HEENT: Moist oral mucosa Neuro: Alert, oriented x 3, moving all extremities CV: S1-S2 appreciated PULM: Few rhonchi GI: Bowel sounds appreciated Extremities: Skin is warm and dry  I reviewed last 24 h vitals and pain scores, last 48 h intake and output, last 24 h labs and trends, and last 24 h imaging results.  Resolved problem list   Assessment and Plan   Acute on chronic hypoxemic respiratory failure due to COVID-pneumonia Underlying chronic obstructive pulmonary disease  - Continue heated high flow - On baricitinib  and dexamethasone  to complete 10 days of treatment - Continue Brovana , Yupelri , Pulmicort , as needed DuoNeb - 7 days of Zosyn  - Continue pulmonary toileting -Encourage self proning  Acute kidney injury on chronic kidney disease stage IIIa S/p kidney transplant 2012 - Continue Prograf , will resume usual prednisone  5 mg daily once Decadron  is completed - Continue to trend electrolytes  Hypertension - Continue home metoprolol  - As needed labetalol   Coronary artery disease History of diastolic congestive heart failure - Continue to monitor closely - Continue aspirin , Zetia   Type 2 diabetes - Continue CBG monitoring, SSI  GERD - Continue H2 blockers  Continue to aggressively support  Slow, steady progress  Best Practice (right click and Reselect all SmartList Selections daily)  Diet/type: Regular consistency (see orders) DVT prophylaxis prophylactic heparin   Pressure ulcer(s): N/A GI prophylaxis: H2B Lines: N/A Foley:  N/A Code Status:  full code Last date of multidisciplinary goals of care discussion patient updated  daily   Labs   CBC: Recent Labs  Lab 05/24/24 1331 05/24/24 1453 05/25/24 0550 05/26/24 0746 05/27/24 0648 05/28/24 0513 05/29/24 1100 05/30/24 0543 05/31/24 0602  WBC 12.3*  --  6.7   < > 10.3 8.7 8.6 9.6 8.0  NEUTROABS 9.7*  --  5.1  --   --   --   --  7.9* 6.0  HGB 11.9*   < > 11.2*   < > 10.4* 11.6* 11.2* 12.1 10.8*  HCT 33.2*   < > 32.3*   < > 30.1* 33.7* 32.8* 36.2 31.7*  MCV 73.8*  --  72.6*   < > 72.2* 72.9* 72.4* 73.9* 72.9*  PLT 131*  --  126*   < > 151 160 150 160 152   < > = values in this interval not displayed.    Basic Metabolic Panel: Recent Labs  Lab 05/24/24 1800 05/25/24 0550 05/26/24 0746 05/26/24 0829 05/27/24 9351 05/28/24 0513 05/29/24 0656 05/30/24 0543 05/31/24 0602  NA 138 142   < >  --  142 136 138 137 138  K 4.9 4.3   < >  --  5.1 4.2 4.9 4.7 4.2  CL 102 107   < >  --  111 107 105 105 102  CO2 16* 21*   < >  --  21* 22 20* 20* 26  GLUCOSE 327* 149*   < >  --  129* 128* 148* 182* 137*  BUN 46* 49*   < >  --  41* 28* 21 18 28*  CREATININE 2.94* 2.71*   < >  --  1.72* 1.46* 1.43* 1.38* 1.73*  CALCIUM  8.9 8.4*   < >  --  8.6* 8.0* 8.7* 8.9 8.9  MG 2.7* 2.4  --  2.3 2.4  --   --  2.1 2.2  PHOS 4.3 4.1  --   --  4.3  --   --  2.8 3.5   < > = values in this interval not displayed.   GFR: Estimated Creatinine Clearance: 20.2 mL/min (A) (by C-G formula based on SCr of 1.73 mg/dL (H)). Recent Labs  Lab 05/24/24 1814 05/25/24 0550 05/26/24 0746 05/26/24 0805 05/27/24 0648 05/28/24 0513 05/29/24 1100 05/30/24 0543 05/31/24 0602  PROCALCITON  --   --   --  1.27  --   --   --   --   --   WBC  --  6.7   < >  --  10.3 8.7 8.6 9.6 8.0  LATICACIDVEN 8.0* 2.9*  --   --  2.0*  --   --   --   --    < > = values in this interval not displayed.    Liver Function Tests: Recent Labs  Lab 05/24/24 1800 05/25/24 0550  AST 31  --   ALT 13  --   ALKPHOS 47  --   BILITOT 0.7  --   PROT 6.0*  --   ALBUMIN 2.6*  2.6* 2.5*   No results for  input(s): LIPASE, AMYLASE in the last 168 hours. No results for input(s): AMMONIA in the last 168 hours.  ABG    Component Value Date/Time   PHART 7.406 (H) 10/16/2011 1057   PCO2ART 36.2 10/16/2011 1057  PO2ART 102.0 (H) 10/16/2011 1057   HCO3 18.3 (L) 05/24/2024 2100   TCO2 26 05/24/2024 1453   ACIDBASEDEF 7.9 (H) 05/24/2024 2100   O2SAT 78.2 05/24/2024 2100     Coagulation Profile: No results for input(s): INR, PROTIME in the last 168 hours.  Cardiac Enzymes: No results for input(s): CKTOTAL, CKMB, CKMBINDEX, TROPONINI in the last 168 hours.  HbA1C: Hemoglobin A1C  Date/Time Value Ref Range Status  10/24/2023 10:31 AM 6.1 (A) 4.0 - 5.6 % Final  01/01/2023 10:01 AM 6.0 (A) 4.0 - 5.6 % Final   Hgb A1c MFr Bld  Date/Time Value Ref Range Status  05/25/2024 05:50 AM 6.8 (H) 4.8 - 5.6 % Final    Comment:    (NOTE) Diagnosis of Diabetes The following HbA1c ranges recommended by the American Diabetes Association (ADA) may be used as an aid in the diagnosis of diabetes mellitus.  Hemoglobin             Suggested A1C NGSP%              Diagnosis  <5.7                   Non Diabetic  5.7-6.4                Pre-Diabetic  >6.4                   Diabetic  <7.0                   Glycemic control for                       adults with diabetes.    02/13/2018 03:05 AM 5.7 (H) 4.8 - 5.6 % Final    Comment:    (NOTE) Pre diabetes:          5.7%-6.4% Diabetes:              >6.4% Glycemic control for   <7.0% adults with diabetes     CBG: Recent Labs  Lab 05/30/24 1128 05/30/24 1601 05/30/24 2019 05/30/24 2321 05/31/24 0343  GLUCAP 174* 219* 143* 114* 168*   The patient is critically ill with multiple organ systems failure and requires high complexity decision making for assessment and support, frequent evaluation and titration of therapies, application of advanced monitoring technologies and extensive interpretation of multiple databases. Critical  Care Time devoted to patient care services described in this note independent of APP/resident time (if applicable)  is 31 minutes.   Jennet Epley MD [facility] Personal pager: See Amion If unanswered, please page CCM On-call: #[phone]

## 2024-06-01 ENCOUNTER — Inpatient Hospital Stay (HOSPITAL_COMMUNITY)

## 2024-06-01 DIAGNOSIS — U071 COVID-19: Secondary | ICD-10-CM | POA: Diagnosis not present

## 2024-06-01 DIAGNOSIS — E119 Type 2 diabetes mellitus without complications: Secondary | ICD-10-CM | POA: Diagnosis not present

## 2024-06-01 DIAGNOSIS — J1282 Pneumonia due to coronavirus disease 2019: Secondary | ICD-10-CM | POA: Diagnosis not present

## 2024-06-01 DIAGNOSIS — J9621 Acute and chronic respiratory failure with hypoxia: Secondary | ICD-10-CM | POA: Diagnosis not present

## 2024-06-01 LAB — RENAL FUNCTION PANEL
Albumin: 2.4 g/dL — ABNORMAL LOW (ref 3.5–5.0)
Anion gap: 7 (ref 5–15)
BUN: 32 mg/dL — ABNORMAL HIGH (ref 8–23)
CO2: 26 mmol/L (ref 22–32)
Calcium: 8.8 mg/dL — ABNORMAL LOW (ref 8.9–10.3)
Chloride: 104 mmol/L (ref 98–111)
Creatinine, Ser: 1.65 mg/dL — ABNORMAL HIGH (ref 0.44–1.00)
GFR, Estimated: 31 mL/min — ABNORMAL LOW (ref >60)
Glucose, Bld: 115 mg/dL — ABNORMAL HIGH (ref 70–99)
Phosphorus: 2.9 mg/dL (ref 2.5–4.6)
Potassium: 3.7 mmol/L (ref 3.5–5.1)
Sodium: 137 mmol/L (ref 135–145)

## 2024-06-01 LAB — GLUCOSE, CAPILLARY
Glucose-Capillary: 106 mg/dL — ABNORMAL HIGH (ref 70–99)
Glucose-Capillary: 107 mg/dL — ABNORMAL HIGH (ref 70–99)
Glucose-Capillary: 108 mg/dL — ABNORMAL HIGH (ref 70–99)
Glucose-Capillary: 124 mg/dL — ABNORMAL HIGH (ref 70–99)
Glucose-Capillary: 130 mg/dL — ABNORMAL HIGH (ref 70–99)
Glucose-Capillary: 134 mg/dL — ABNORMAL HIGH (ref 70–99)
Glucose-Capillary: 138 mg/dL — ABNORMAL HIGH (ref 70–99)
Glucose-Capillary: 171 mg/dL — ABNORMAL HIGH (ref 70–99)
Glucose-Capillary: 180 mg/dL — ABNORMAL HIGH (ref 70–99)

## 2024-06-01 MED ORDER — AMLODIPINE BESYLATE 10 MG PO TABS
10.0000 mg | ORAL_TABLET | Freq: Every day | ORAL | Status: DC
  Administered 2024-06-01 – 2024-06-09 (×9): 10 mg via ORAL
  Filled 2024-06-01 (×9): qty 1

## 2024-06-01 NOTE — Progress Notes (Signed)
   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 No f/c/r, LL edema, CP, N/V, abd pain, rash, melena, or blood per rectum. Given Lasix  120 mg, Solumedrol 125 mg, Zosyn , and Linezolid . Reportedly oxygen  saturation was around 55% on 4 L, treated with a DuoNeb with improvement of saturation to 83% on NRM. On BiPAP 08/25/59%, RR 15, saturating at 95%. COVID positive.   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 On BiPAP 08/25/59%, RR 15, saturating at 95%. 05/24/2024: will admit to ICU 7/7 placed on heated HHFNC , off bipap   Interim History / Subjective:  States breathing improved. Remains on HHFNC  Objective    Blood pressure (!) 153/65, pulse 72, temperature 98.5 F (36.9 C), temperature source Oral, resp. rate (!) 22, height 5' 2 (1.575 m), weight 51.8 kg, last menstrual period 03/04/2022, SpO2 (!) 81%.    FiO2 (%):  [46 %-71 %] 70 %   Intake/Output Summary (Last 24  hours) at 06/01/2024 1339 Last data filed at 06/01/2024 1200 Gross per 24 hour  Intake 1028 ml  Output 650 ml  Net 378 ml   Filed Weights   05/30/24 0500 05/31/24 0500 06/01/24 0500  Weight: 51.2 kg 51.6 kg 51.8 kg    Examination: No distress Bedside US : IVC ~2cm minimal resp variation, internal jugular veins also have respirophasic changes so seems euvolemic Lungs don't sound bad Moves to command Globally weak  Cr pending   Resolved problem list   Assessment and Plan   Acute on chronic hypoxemic respiratory failure due to COVID-pneumonia +/- superimposed CAP improving Underlying chronic obstructive pulmonary disease Acute kidney injury on chronic kidney disease stage IIIa S/p kidney transplant 2012- prograf  and pred PTA; US  looks fine Hypertension- home meds Coronary artery disease- home meds, hx CABG History of diastolic congestive heart failure- bedside echo stable function, nothing too concerning; suspect groups 2/3 pulm HTN longstanding DM2 GERD- on PTA meds Muscular deconditioning- PT/OT evals appreciated  Overall improving Plan for baricitinib , zosyn , and decadron  as ordered F/u am labs Resume PTA pred once decadron  done Okay for prograft in a week or so Encourage IS Check LE duplex for completeness sake; RV looked fine on bedside just low normal function similar to prior If renal function trends wrong direction would re-engage nephro, she appears euvolemic at present Stable to transfer to progressive and back on IMTS service, message sent to team  Rolan Sharps MD PCCM

## 2024-06-01 NOTE — Plan of Care (Signed)
  Problem: Education: Goal: Knowledge of risk factors and measures for prevention of condition will improve Outcome: Progressing   Problem: Coping: Goal: Psychosocial and spiritual needs will be supported Outcome: Progressing   Problem: Respiratory: Goal: Will maintain a patent airway Outcome: Progressing Goal: Complications related to the disease process, condition or treatment will be avoided or minimized Outcome: Progressing   Problem: Education: Goal: Knowledge of General Education information will improve Description: Including pain rating scale, medication(s)/side effects and non-pharmacologic comfort measures Outcome: Progressing   Problem: Health Behavior/Discharge Planning: Goal: Ability to manage health-related needs will improve Outcome: Progressing   Problem: Clinical Measurements: Goal: Ability to maintain clinical measurements within normal limits will improve Outcome: Progressing Goal: Will remain free from infection Outcome: Progressing Goal: Diagnostic test results will improve Outcome: Progressing Goal: Respiratory complications will improve Outcome: Progressing Goal: Cardiovascular complication will be avoided Outcome: Progressing   Problem: Activity: Goal: Risk for activity intolerance will decrease Outcome: Progressing   Problem: Nutrition: Goal: Adequate nutrition will be maintained Outcome: Progressing   Problem: Coping: Goal: Level of anxiety will decrease Outcome: Progressing   Problem: Elimination: Goal: Will not experience complications related to bowel motility Outcome: Progressing Goal: Will not experience complications related to urinary retention Outcome: Progressing   Problem: Pain Managment: Goal: General experience of comfort will improve and/or be controlled Outcome: Progressing   Problem: Safety: Goal: Ability to remain free from injury will improve Outcome: Progressing   Problem: Skin Integrity: Goal: Risk for impaired  skin integrity will decrease Outcome: Progressing   Problem: Education: Goal: Ability to demonstrate management of disease process will improve Outcome: Progressing Goal: Ability to verbalize understanding of medication therapies will improve Outcome: Progressing Goal: Individualized Educational Video(s) Outcome: Progressing   Problem: Activity: Goal: Capacity to carry out activities will improve Outcome: Progressing   Problem: Cardiac: Goal: Ability to achieve and maintain adequate cardiopulmonary perfusion will improve Outcome: Progressing   Problem: Education: Goal: Ability to describe self-care measures that may prevent or decrease complications (Diabetes Survival Skills Education) will improve Outcome: Progressing Goal: Individualized Educational Video(s) Outcome: Progressing   Problem: Coping: Goal: Ability to adjust to condition or change in health will improve Outcome: Progressing   Problem: Fluid Volume: Goal: Ability to maintain a balanced intake and output will improve Outcome: Progressing   Problem: Health Behavior/Discharge Planning: Goal: Ability to identify and utilize available resources and services will improve Outcome: Progressing Goal: Ability to manage health-related needs will improve Outcome: Progressing   Problem: Metabolic: Goal: Ability to maintain appropriate glucose levels will improve Outcome: Progressing   Problem: Nutritional: Goal: Maintenance of adequate nutrition will improve Outcome: Progressing Goal: Progress toward achieving an optimal weight will improve Outcome: Progressing   Problem: Skin Integrity: Goal: Risk for impaired skin integrity will decrease Outcome: Progressing   Problem: Tissue Perfusion: Goal: Adequacy of tissue perfusion will improve Outcome: Progressing

## 2024-06-01 NOTE — Progress Notes (Signed)
 Patient transported from 4N30 to 2H24 with RT and RN at bedside while on NRB. No complications noted.

## 2024-06-01 NOTE — Progress Notes (Signed)
 Patient is stable to transfer out of ICU per PCCM team. IMTS will assume care of patient on 7/14 at 7AM.   Hadassah Kristy Ahr, MD [facility] Program - PGY-3

## 2024-06-02 ENCOUNTER — Inpatient Hospital Stay (HOSPITAL_COMMUNITY)

## 2024-06-02 DIAGNOSIS — R609 Edema, unspecified: Secondary | ICD-10-CM | POA: Diagnosis not present

## 2024-06-02 LAB — GLUCOSE, CAPILLARY
Glucose-Capillary: 170 mg/dL — ABNORMAL HIGH (ref 70–99)
Glucose-Capillary: 128 mg/dL — ABNORMAL HIGH (ref 70–99)
Glucose-Capillary: 113 mg/dL — ABNORMAL HIGH (ref 70–99)
Glucose-Capillary: 135 mg/dL — ABNORMAL HIGH (ref 70–99)

## 2024-06-02 LAB — RENAL FUNCTION PANEL
Albumin: 2.4 g/dL — ABNORMAL LOW (ref 3.5–5.0)
Anion gap: 9 (ref 5–15)
BUN: 29 mg/dL — ABNORMAL HIGH (ref 8–23)
CO2: 22 mmol/L (ref 22–32)
Calcium: 9 mg/dL (ref 8.9–10.3)
Chloride: 106 mmol/L (ref 98–111)
Creatinine, Ser: 1.33 mg/dL — ABNORMAL HIGH (ref 0.44–1.00)
GFR, Estimated: 40 mL/min — ABNORMAL LOW (ref >60)
Glucose, Bld: 138 mg/dL — ABNORMAL HIGH (ref 70–99)
Phosphorus: 2.8 mg/dL (ref 2.5–4.6)
Potassium: 4.8 mmol/L (ref 3.5–5.1)
Sodium: 137 mmol/L (ref 135–145)

## 2024-06-02 LAB — CBC
HCT: 29.8 % — ABNORMAL LOW (ref 36.0–46.0)
Hemoglobin: 10.2 g/dL — ABNORMAL LOW (ref 12.0–15.0)
MCH: 25.2 pg — ABNORMAL LOW (ref 26.0–34.0)
MCHC: 34.2 g/dL (ref 30.0–36.0)
MCV: 73.6 fL — ABNORMAL LOW (ref 80.0–100.0)
Platelets: 159 K/uL (ref 150–400)
RBC: 4.05 MIL/uL (ref 3.87–5.11)
RDW: 19.3 % — ABNORMAL HIGH (ref 11.5–15.5)
WBC: 9.3 K/uL (ref 4.0–10.5)
nRBC: 0 % (ref 0.0–0.2)

## 2024-06-02 MED ORDER — PREDNISONE 5 MG PO TABS
5.0000 mg | ORAL_TABLET | Freq: Every day | ORAL | Status: DC
  Administered 2024-06-03 – 2024-06-09 (×7): 5 mg via ORAL
  Filled 2024-06-02 (×7): qty 1

## 2024-06-02 NOTE — Plan of Care (Signed)
  Problem: Education: Goal: Knowledge of risk factors and measures for prevention of condition will improve Outcome: Progressing   Problem: Coping: Goal: Psychosocial and spiritual needs will be supported Outcome: Progressing   Problem: Respiratory: Goal: Will maintain a patent airway Outcome: Progressing Goal: Complications related to the disease process, condition or treatment will be avoided or minimized Outcome: Progressing   Problem: Education: Goal: Knowledge of General Education information will improve Description: Including pain rating scale, medication(s)/side effects and non-pharmacologic comfort measures Outcome: Progressing   Problem: Health Behavior/Discharge Planning: Goal: Ability to manage health-related needs will improve Outcome: Progressing   Problem: Clinical Measurements: Goal: Ability to maintain clinical measurements within normal limits will improve Outcome: Progressing Goal: Will remain free from infection Outcome: Progressing Goal: Diagnostic test results will improve Outcome: Progressing Goal: Respiratory complications will improve Outcome: Progressing Goal: Cardiovascular complication will be avoided Outcome: Progressing   Problem: Activity: Goal: Risk for activity intolerance will decrease Outcome: Progressing   Problem: Nutrition: Goal: Adequate nutrition will be maintained Outcome: Progressing   Problem: Coping: Goal: Level of anxiety will decrease Outcome: Progressing   Problem: Elimination: Goal: Will not experience complications related to bowel motility Outcome: Progressing Goal: Will not experience complications related to urinary retention Outcome: Progressing   Problem: Pain Managment: Goal: General experience of comfort will improve and/or be controlled Outcome: Progressing   Problem: Safety: Goal: Ability to remain free from injury will improve Outcome: Progressing   Problem: Skin Integrity: Goal: Risk for impaired  skin integrity will decrease Outcome: Progressing   Problem: Education: Goal: Ability to demonstrate management of disease process will improve Outcome: Progressing Goal: Ability to verbalize understanding of medication therapies will improve Outcome: Progressing Goal: Individualized Educational Video(s) Outcome: Progressing   Problem: Activity: Goal: Capacity to carry out activities will improve Outcome: Progressing   Problem: Cardiac: Goal: Ability to achieve and maintain adequate cardiopulmonary perfusion will improve Outcome: Progressing   Problem: Education: Goal: Ability to describe self-care measures that may prevent or decrease complications (Diabetes Survival Skills Education) will improve Outcome: Progressing Goal: Individualized Educational Video(s) Outcome: Progressing   Problem: Coping: Goal: Ability to adjust to condition or change in health will improve Outcome: Progressing   Problem: Fluid Volume: Goal: Ability to maintain a balanced intake and output will improve Outcome: Progressing   Problem: Health Behavior/Discharge Planning: Goal: Ability to identify and utilize available resources and services will improve Outcome: Progressing Goal: Ability to manage health-related needs will improve Outcome: Progressing   Problem: Metabolic: Goal: Ability to maintain appropriate glucose levels will improve Outcome: Progressing   Problem: Nutritional: Goal: Maintenance of adequate nutrition will improve Outcome: Progressing Goal: Progress toward achieving an optimal weight will improve Outcome: Progressing   Problem: Skin Integrity: Goal: Risk for impaired skin integrity will decrease Outcome: Progressing   Problem: Tissue Perfusion: Goal: Adequacy of tissue perfusion will improve Outcome: Progressing

## 2024-06-02 NOTE — Progress Notes (Addendum)
 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  Overnight Events: No event overnight.  This morning, examined at bedside.  Continues to have productive cough, but states her breathing is better.   Denies chest pain or SOB.    Objective:  Vital signs in last 24 hours: Vitals:   06/02/24 0518 06/02/24 0600 06/02/24 0700 06/02/24 0832  BP: (!) 152/93 (!) 153/59    Pulse: 70 (!) 59 67   Resp: 18     Temp:      TempSrc:      SpO2: 93% 97% 93% 90%  Weight:      Height:       Supplemental O2: HFNC SpO2: 90 % O2 Flow Rate (L/min): 35 L/min FiO2 (%): 50 %   Physical Exam:   Elderly woman, lying in bed, NAD Clear bilateral lungs, with reduced air movement to the left lower lung base.  No wheezes. Heart with RRR, no murmurs. Skin warm and dry, no edema. Abdomen soft and nontender. Mood and affect appropriate.  Moving all extremities.  Filed Weights   05/31/24 0500 06/01/24 0500 06/02/24 0500  Weight: 51.6 kg 51.8 kg 52.5 kg     Intake/Output Summary (Last 24 hours) at 06/02/2024 1111 Last data filed at 06/01/2024 2152 Gross per 24 hour  Intake 950 ml  Output --  Net 950 ml   Net IO Since Admission: 6,743.27 mL [06/02/24 1111]  Recent Labs    06/01/24 2010 06/01/24 2346 06/02/24 0823  GLUCAP 108* 180* 170*     Pertinent Labs:    Latest Ref Rng & Units 06/02/2024    2:45 AM 05/31/2024    6:02 AM 05/30/2024    5:43 AM  CBC  WBC 4.0 - 10.5 K/uL 9.3  8.0  9.6   Hemoglobin 12.0 - 15.0 g/dL 89.7  89.1  87.8   Hematocrit 36.0 - 46.0 % 29.8  31.7  36.2   Platelets 150 - 400 K/uL 159  152   160        Latest Ref Rng & Units 06/02/2024    2:45 AM 06/01/2024    2:41 PM 05/31/2024    6:02 AM  CMP  Glucose 70 - 99 mg/dL 861  884  862   BUN 8 - 23 mg/dL 29  32  28   Creatinine 0.44 - 1.00 mg/dL 8.66  8.34  8.26   Sodium 135 - 145 mmol/L 137  137  138   Potassium 3.5 - 5.1 mmol/L 4.8  3.7  4.2   Chloride 98 - 111 mmol/L 106  104  102   CO2 22 - 32 mmol/L 22  26  26    Calcium  8.9 - 10.3 mg/dL 9.0  8.8  8.9     Imaging: No results found.  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   #Acute on Chronic hypoxic respiratory failure( 4-6 L supplemental oxygen ) # COVID-pneumonia. # Bronchopneumonia # Asthma-COPD overlap syndrome Patient is improving but remains above her baseline oxygen  requirement. Currently on 50% FiO2 and 35 L/min on HHFNC.  Acute on chronic respiratory failure secondary to COVID-19 pneumonia and bronchopneumonia. Completed 7 days of broad-spectrum antibiotics (zosyn  and linezolid ); linezolid  was discontinued after day 3 following a negative MRSA PCR. COVID-19 pneumonia is being treated with baricitinib  and dexamethasone . Bedside echocardiogram yesterday showed normal right ventricle with mildly reduced function. Follow-up lower extremity ultrasound showed no evidence of deep vein thrombosis -  Airborne/contact precaution. - Wean off oxygen  as tolerated. - Completed decadron  on 07/13. - # day 8/14 of baricitinib   - Continue with Brovana /Yupelri /Pulmicort /as needed DuoNebs.   #AKI on CKD 3A #S/p kidney transplant 2012. SCr back to baseline, baseline has been between 1.2-1.7.  AKI is likely ischemic ATN, in the setting of sepsis and hypotension.  She is on tacrolimus  for history of kidney transplant. - Strict ins and outs - CW tacrolimus  4 mg twice daily. - resumed home prednisone  5 mg. - Daily RFP  # Hypertension BP mildly elevated, HDS. -Continue with amlodipine  10 mg, and metoprolol  50 mg twice daily.  #Coronary artery disease. #History of diastolic CHF(EF 60 to 65%, G2 DD) - Continue aspirin  81 mg and Zetia  10 mg daily.  Type 2 diabetes A1c 6.8 on 7/6.  Blood glucose stable.  Discontinued semglee  due to hypoglycemia. - SSI. - CBG  #GERD - Famotidine  20 mg daily.  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  Diet: Renal diet IVF: PO intake VTE: SCDs Start: 05/24/24 2222 heparin  injection 5,000 Units Start: 05/24/24 1730 Code: Full PT/OT: Pending ID:  Anti-infectives (From admission, onward)    Start     Dose/Rate Route Frequency Ordered Stop   05/28/24 1000  piperacillin -tazobactam (ZOSYN ) IVPB 3.375 g        3.375 g 12.5 mL/hr over 240 Minutes Intravenous Every 8 hours 05/28/24 0848 05/30/24 2203   05/25/24 2200  piperacillin -tazobactam (ZOSYN ) IVPB 2.25 g  Status:  Discontinued        2.25 g 100 mL/hr over 30 Minutes Intravenous Every 8 hours 05/25/24 1436 05/28/24 0848   05/25/24 1000  remdesivir  100 mg in sodium chloride  0.9 % 100 mL IVPB  Status:  Discontinued       Placed in Followed by Linked Group   100 mg 200 mL/hr over 30 Minutes Intravenous Daily 05/24/24 2133 05/25/24 1254   05/24/24 2300  remdesivir  200 mg in sodium chloride  0.9% 250 mL IVPB       Placed in  Followed by Linked Group   200 mg 580 mL/hr over 30 Minutes Intravenous Once 05/24/24 2133 05/25/24 0721   05/24/24 2200  linezolid  (ZYVOX ) IVPB 600 mg  Status:  Discontinued        600 mg 300 mL/hr over 60 Minutes Intravenous Every 12 hours 05/24/24 1722 05/26/24 1104   05/24/24 2100  piperacillin -tazobactam (ZOSYN ) IVPB 3.375 g  Status:  Discontinued        3.375 g 12.5 mL/hr over 240 Minutes Intravenous Every 8 hours 05/24/24 1728  05/25/24 1436   05/24/24 1500  cefTRIAXone  (ROCEPHIN ) 1 g in sodium chloride  0.9 % 100 mL IVPB        1 g 200 mL/hr over 30 Minutes Intravenous  Once 05/24/24 1450 05/24/24 1544   05/24/24 1500  doxycycline  (VIBRAMYCIN ) 100 mg in sodium chloride  0.9 % 250 mL IVPB  Status:  Discontinued        100 mg 125 mL/hr over 120 Minutes Intravenous  Once 05/24/24 1450 05/24/24 1726      Anticipated discharge to pending medical stabilization.  Celestina Czar, MD 06/02/2024, 11:11 AM Pager: [phone] [facility]  Please contact the on call pager after 5 pm and on weekends at [phone].

## 2024-06-02 NOTE — Progress Notes (Signed)
 Physical Therapy Treatment Patient Details Name: [name] MRN: [id number] DOB: 1941/04/14 Today's Date: 06/02/2024   History of Present Illness Pt is an 83 y.o. F presenting to [facility] on 05/24/24 w/ dyspnea, dry cough, wheezing, and diarrhea for 3 days. Pt admitted w/ acute on chronic hypoxic respiratory failure due to COVID. 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    PT Comments  Tolerated treatment well, gait progression limited by wall connection to HHFNC. SpO2 maintained 98-100% throughout session. Reviewed LE exercises, transfer training, and pre-gait activities. Performed BERG which did indicate high fall risk. Uses rollator at home which should provide adequate support. Would like to progress gait as soon as possible - may need to coordinate with RT if she remains on HHFNC for a longer period of time. Will progress as tolerated.    If plan is discharge home, recommend the following: A little help with walking and/or transfers;Help with stairs or ramp for entrance   Can travel by private vehicle        Equipment Recommendations  None recommended by PT    Recommendations for Other Services       Precautions / Restrictions Precautions Precautions: Fall;Other (comment) (O2 sats) Precaution/Restrictions Comments: HHFNC Restrictions Weight Bearing Restrictions Per Provider Order: No     Mobility  Bed Mobility               General bed mobility comments: in recliner    Transfers Overall transfer level: Needs assistance Equipment used: None Transfers: Sit to/from Stand, Bed to chair/wheelchair/BSC Sit to Stand: Contact guard assist           General transfer comment: CGA for safety to stand from recliner. Good hand placement wide BOS, mild sway.    Ambulation/Gait             Pre-gait activities: Weight shift, static march. with and without hand held support.  Step forward and back. General Gait Details: deferred due to HHFNC attached to wall.   Stairs             Wheelchair Mobility     Tilt Bed    Modified Rankin (Stroke Patients Only)       Balance Overall balance assessment: Needs assistance Sitting-balance support: Feet supported Sitting balance-Leahy Scale: Good     Standing balance support: No upper extremity supported, During functional activity Standing balance-Leahy Scale: Fair                   Standardized Balance Assessment Standardized Balance Assessment : Berg Balance Test Berg Balance Test Sit to Stand: Able to stand  independently using hands Standing Unsupported: Able to stand safely 2 minutes Sitting with Back Unsupported but Feet Supported on Floor or Stool: Able to sit safely and securely 2 minutes Stand to Sit: Sits safely with minimal use of hands Transfers: Able to transfer safely, definite need of hands Standing Unsupported with Eyes Closed: Able to stand 10 seconds with supervision Standing Ubsupported with Feet Together: Able to place feet together independently but unable to hold for 30 seconds From Standing, Reach Forward with Outstretched Arm: Can reach forward >12 cm safely (5) From Standing Position, Pick up Object from Floor: Unable to pick up and needs supervision From Standing Position, Turn to Look Behind Over each Shoulder: Looks behind one side only/other side shows less weight shift Turn 360 Degrees: Needs close supervision or verbal cueing Standing Unsupported, Alternately  Place Feet on Step/Stool: Able to complete >2 steps/needs minimal assist Standing Unsupported, One Foot in Front: Loses balance while stepping or standing Standing on One Leg: Tries to lift leg/unable to hold 3 seconds but remains standing independently Total Score: 33        Communication Communication Communication: No apparent difficulties  Cognition Arousal: Alert Behavior During Therapy: WFL for  tasks assessed/performed   PT - Cognitive impairments: No apparent impairments                         Following commands: Intact      Cueing Cueing Techniques: Verbal cues  Exercises General Exercises - Lower Extremity Ankle Circles/Pumps: AROM, Both, 10 reps, Seated Quad Sets: Strengthening, Both, 10 reps, Seated Gluteal Sets: Strengthening, Both, 10 reps, Seated    General Comments General comments (skin integrity, edema, etc.): 39L HHFNC, SpO2 98-100%, moderate dyspnea.      Pertinent Vitals/Pain Pain Assessment Pain Assessment: No/denies pain    Home Living                          Prior Function            PT Goals (current goals can now be found in the care plan section) Acute Rehab PT Goals Patient Stated Goal: none stated; agrees to PT goals PT Goal Formulation: With patient Time For Goal Achievement: 06/12/24 Potential to Achieve Goals: Good Progress towards PT goals: Progressing toward goals    Frequency    Min 2X/week      PT Plan      Co-evaluation              AM-PAC PT 6 Clicks Mobility   Outcome Measure  Help needed turning from your back to your side while in a flat bed without using bedrails?: None Help needed moving from lying on your back to sitting on the side of a flat bed without using bedrails?: None Help needed moving to and from a bed to a chair (including a wheelchair)?: A Little Help needed standing up from a chair using your arms (e.g., wheelchair or bedside chair)?: A Little Help needed to walk in hospital room?: A Lot Help needed climbing 3-5 steps with a railing? : Total 6 Click Score: 17    End of Session Equipment Utilized During Treatment: Oxygen  Activity Tolerance: Treatment limited secondary to medical complications (Comment) (HHFNC) Patient left: with call bell/phone within reach;in chair (As found when PT entered room.) Nurse Communication: Mobility status PT Visit Diagnosis:  Difficulty in walking, not elsewhere classified (R26.2);Other abnormalities of gait and mobility (R26.89)     Time: 8441-8386 PT Time Calculation (min) (ACUTE ONLY): 15 min  Charges:    $Therapeutic Activity: 8-22 mins PT General Charges $$ ACUTE PT VISIT: 1 Visit                     Leontine Roads, PT, DPT [facility]  Rehabilitation Services Physical Therapist Office: [phone] Website: [email or web address]     Leontine GORMAN Roads 06/02/2024, 4:23 PM

## 2024-06-02 NOTE — Progress Notes (Signed)
 BLE venous exam complete. Floria Brandau, RVT

## 2024-06-03 LAB — RENAL FUNCTION PANEL
Albumin: 2.6 g/dL — ABNORMAL LOW (ref 3.5–5.0)
Anion gap: 9 (ref 5–15)
BUN: 30 mg/dL — ABNORMAL HIGH (ref 8–23)
CO2: 21 mmol/L — ABNORMAL LOW (ref 22–32)
Calcium: 8.9 mg/dL (ref 8.9–10.3)
Chloride: 108 mmol/L (ref 98–111)
Creatinine, Ser: 1.51 mg/dL — ABNORMAL HIGH (ref 0.44–1.00)
GFR, Estimated: 35 mL/min — ABNORMAL LOW (ref >60)
Glucose, Bld: 87 mg/dL (ref 70–99)
Phosphorus: 3 mg/dL (ref 2.5–4.6)
Potassium: 4.4 mmol/L (ref 3.5–5.1)
Sodium: 138 mmol/L (ref 135–145)

## 2024-06-03 LAB — GLUCOSE, CAPILLARY
Glucose-Capillary: 108 mg/dL — ABNORMAL HIGH (ref 70–99)
Glucose-Capillary: 110 mg/dL — ABNORMAL HIGH (ref 70–99)
Glucose-Capillary: 134 mg/dL — ABNORMAL HIGH (ref 70–99)
Glucose-Capillary: 136 mg/dL — ABNORMAL HIGH (ref 70–99)
Glucose-Capillary: 80 mg/dL (ref 70–99)
Glucose-Capillary: 99 mg/dL (ref 70–99)

## 2024-06-03 LAB — CBC
HCT: 28.7 % — ABNORMAL LOW (ref 36.0–46.0)
Hemoglobin: 9.8 g/dL — ABNORMAL LOW (ref 12.0–15.0)
MCH: 25.2 pg — ABNORMAL LOW (ref 26.0–34.0)
MCHC: 34.1 g/dL (ref 30.0–36.0)
MCV: 73.8 fL — ABNORMAL LOW (ref 80.0–100.0)
Platelets: 154 K/uL (ref 150–400)
RBC: 3.89 MIL/uL (ref 3.87–5.11)
RDW: 19.4 % — ABNORMAL HIGH (ref 11.5–15.5)
WBC: 9.1 K/uL (ref 4.0–10.5)
nRBC: 0.2 % (ref 0.0–0.2)

## 2024-06-03 MED ORDER — INSULIN ASPART 100 UNIT/ML IJ SOLN: 0.0000 [IU] | Freq: Every day | INTRAMUSCULAR | Status: DC

## 2024-06-03 MED ORDER — DICLOFENAC SODIUM 1 % EX GEL
4.0000 g | Freq: Four times a day (QID) | CUTANEOUS | Status: DC
  Administered 2024-06-03 – 2024-06-05 (×10): 4 g via TOPICAL
  Filled 2024-06-03: qty 100

## 2024-06-03 MED ORDER — INSULIN ASPART 100 UNIT/ML IJ SOLN
0.0000 [IU] | Freq: Three times a day (TID) | INTRAMUSCULAR | Status: DC
  Administered 2024-06-04: 3 [IU] via SUBCUTANEOUS
  Administered 2024-06-04: 1 [IU] via SUBCUTANEOUS
  Administered 2024-06-05 (×2): 2 [IU] via SUBCUTANEOUS
  Administered 2024-06-05: 1 [IU] via SUBCUTANEOUS
  Administered 2024-06-06: 2 [IU] via SUBCUTANEOUS
  Administered 2024-06-07: 1 [IU] via SUBCUTANEOUS
  Administered 2024-06-07: 2 [IU] via SUBCUTANEOUS
  Administered 2024-06-08: 1 [IU] via SUBCUTANEOUS
  Administered 2024-06-08: 2 [IU] via SUBCUTANEOUS

## 2024-06-03 NOTE — Progress Notes (Signed)
 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  Overnight Events: No acute events over night  The pt was seen and examined at the bedside this morning. She is in high spirits and happy to be out of the ICU. She states that she is feeling better but is afraid that her lungs are getting worse overall. She also reports left leg pain described as a dull ache in the middle thigh. All questions and concerns were addressed at this time.   Objective:  Vital signs in last 24 hours: Vitals:   06/03/24 0805 06/03/24 1340 06/03/24 1345 06/03/24 1608  BP: (!) 156/62   134/73  Pulse: 75   76  Resp: 16   15  Temp: 98.7 F (37.1 C)   98.5 F (36.9 C)  TempSrc: Oral   Oral  SpO2: (P) 100% 100% 99% 99%  Weight:      Height:       Supplemental O2: HFNC SpO2: 99 % O2 Flow Rate (L/min): 35 L/min FiO2 (%): 70 %   Physical Exam:   Const: Awake, alert in NAD,  HENT: Normocephalic, atraumatic, mucus membranes moist with HFNC in place Card: RRR, No pitting edema on LE's bilaterally  Resp: Expiratory wheezes bilaterally Abd: Soft, NTND,  Extremities: Warm, left thigh ttp without edema, erythema, or obvious deformity.   Filed Weights   06/01/24 0500 06/02/24 0500 06/03/24 0404  Weight: 51.8 kg 52.5 kg 49.3 kg     Intake/Output Summary (Last 24 hours) at 06/03/2024 1648 Last data filed at 06/03/2024 9379 Gross per 24 hour  Intake 250 ml  Output 550 ml  Net -300 ml   Net IO Since Admission: 7,143.27 mL [06/03/24 1648]  Recent Labs    06/03/24 0401 06/03/24 0825 06/03/24 1153  GLUCAP 80 136* 110*      Pertinent Labs:    Latest Ref Rng & Units 06/03/2024    3:43 AM 06/02/2024    2:45 AM 05/31/2024    6:02 AM  CBC  WBC 4.0 - 10.5 K/uL 9.1  9.3  8.0   Hemoglobin 12.0 - 15.0 g/dL 9.8  89.7  89.1   Hematocrit 36.0 - 46.0 % 28.7  29.8  31.7   Platelets 150 - 400 K/uL 154  159  152        Latest Ref Rng & Units 06/03/2024    3:43 AM 06/02/2024    2:45 AM 06/01/2024    2:41 PM  CMP  Glucose 70 - 99 mg/dL 87  861  884   BUN 8 - 23 mg/dL 30  29  32   Creatinine 0.44 - 1.00 mg/dL 8.48  8.66  8.34   Sodium 135 - 145 mmol/L 138  137  137   Potassium 3.5 - 5.1 mmol/L 4.4  4.8  3.7   Chloride 98 - 111 mmol/L 108  106  104   CO2 22 - 32 mmol/L 21  22  26    Calcium  8.9 - 10.3 mg/dL 8.9  9.0  8.8     Imaging: No results found.  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   #Acute on Chronic hypoxic respiratory failure( 4-6 L supplemental oxygen ) # COVID-pneumonia. # Bronchopneumonia # Asthma-COPD overlap syndrome Patient is improving ini respiratory status overall, but is still requiring significant supplemental oxygen . Currently on 70% FiO2 and 35 L/min on HHFNC. -s/p 7 day course Zosyn  for PNA coverage, Completed decadron  on 07/13. -Will continue baricitinib  for 14 day course through 7/20 -LE U/S without evidence of  deep vein thrombosis - Airborne/contact precaution. - Continue with Brovana /Yupelri /Pulmicort /as needed DuoNebs. -Discussed with respiratory therapy this afternoon regarding oxygenation. Pt has significant history of COPD, so recommended SpO2 is 88-92%, however she is consistently 99-100%. Will work with RT to wean pt down and achieve recommended SpO2.  #AKI on CKD 3A #S/p kidney transplant 2012. SCr back to baseline, baseline has been between 1.2-1.7.  AKI is likely ischemic ATN, in the setting of sepsis and hypotension.  She is on tacrolimus  for history of kidney transplant. - Strict ins and outs - CW tacrolimus  4 mg twice daily. - resumed home prednisone  5 mg. - Daily RFP  #Left leg pain -Pt with left leg pain today on evaluation. Likely musculoskeletal in nature, will try voltaren  gel for pain relief  # Hypertension 156/62 this am.  -Continue with amlodipine  10 mg, and metoprolol  50 mg twice daily.  #Coronary artery disease. #History of diastolic CHF(EF 60 to 65%, G2 DD) - Continue aspirin  81 mg and Zetia  10 mg daily.  Type 2 diabetes A1c 6.8 on 7/6.  Blood glucose stable.  Discontinued semglee  due to hypoglycemia. - SSI. - CBG  #GERD - Famotidine  20 mg daily.  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  Diet: Renal diet IVF: PO intake VTE: SCDs Start: 05/24/24 2222 heparin  injection 5,000 Units Start: 05/24/24 1730 Code: Full PT/OT: Pending ID:  Anti-infectives (From admission, onward)    Start     Dose/Rate Route Frequency Ordered Stop   05/28/24 1000  piperacillin -tazobactam (ZOSYN ) IVPB 3.375 g        3.375 g 12.5 mL/hr over 240 Minutes Intravenous Every 8 hours 05/28/24 0848 05/30/24 2203   05/25/24 2200  piperacillin -tazobactam (ZOSYN ) IVPB 2.25 g  Status:  Discontinued        2.25 g 100 mL/hr over 30 Minutes Intravenous Every 8 hours 05/25/24 1436 05/28/24 0848   05/25/24 1000   remdesivir  100 mg in sodium chloride  0.9 % 100 mL IVPB  Status:  Discontinued       Placed in Followed by Linked Group   100 mg 200 mL/hr over 30 Minutes Intravenous Daily 05/24/24 2133 05/25/24 1254   05/24/24 2300  remdesivir  200 mg in sodium chloride  0.9% 250 mL IVPB       Placed in Followed by Linked Group   200 mg 580 mL/hr over 30 Minutes Intravenous Once 05/24/24 2133 05/25/24 0721   05/24/24 2200  linezolid  (ZYVOX ) IVPB 600 mg  Status:  Discontinued        600 mg 300 mL/hr over 60 Minutes Intravenous Every 12 hours 05/24/24 1722 05/26/24 1104   05/24/24 2100  piperacillin -tazobactam (ZOSYN ) IVPB 3.375 g  Status:  Discontinued        3.375 g 12.5 mL/hr over 240 Minutes Intravenous Every 8 hours 05/24/24 1728 05/25/24 1436   05/24/24 1500  cefTRIAXone  (ROCEPHIN ) 1 g in sodium chloride  0.9 % 100 mL IVPB        1 g 200 mL/hr over 30 Minutes Intravenous  Once 05/24/24 1450 05/24/24 1544   05/24/24 1500  doxycycline  (VIBRAMYCIN ) 100 mg in sodium chloride  0.9 % 250 mL IVPB  Status:  Discontinued        100 mg 125 mL/hr over 120 Minutes Intravenous  Once 05/24/24 1450 05/24/24 1726      Anticipated discharge to pending medical stabilization.  Myrna Bitters, DO 06/03/2024, 4:48 PM Pager: [phone] [facility]  Please contact the on call pager after 5 pm and on weekends at [phone].

## 2024-06-03 NOTE — Plan of Care (Signed)
  Problem: Education: Goal: Knowledge of risk factors and measures for prevention of condition will improve Outcome: Progressing   Problem: Coping: Goal: Psychosocial and spiritual needs will be supported Outcome: Progressing   Problem: Respiratory: Goal: Will maintain a patent airway Outcome: Progressing pt has been doing well with oxygen  weaning by respiratory. Goal: Complications related to the disease process, condition or treatment will be avoided or minimized Outcome: Progressing

## 2024-06-04 ENCOUNTER — Encounter (HOSPITAL_COMMUNITY): Payer: Self-pay | Admitting: Pulmonary Disease

## 2024-06-04 ENCOUNTER — Inpatient Hospital Stay (HOSPITAL_COMMUNITY)

## 2024-06-04 LAB — RENAL FUNCTION PANEL
Albumin: 2.7 g/dL — ABNORMAL LOW (ref 3.5–5.0)
Anion gap: 11 (ref 5–15)
BUN: 23 mg/dL (ref 8–23)
CO2: 24 mmol/L (ref 22–32)
Calcium: 8.9 mg/dL (ref 8.9–10.3)
Chloride: 102 mmol/L (ref 98–111)
Creatinine, Ser: 1.32 mg/dL — ABNORMAL HIGH (ref 0.44–1.00)
GFR, Estimated: 41 mL/min — ABNORMAL LOW (ref >60)
Glucose, Bld: 109 mg/dL — ABNORMAL HIGH (ref 70–99)
Phosphorus: 3 mg/dL (ref 2.5–4.6)
Potassium: 3.8 mmol/L (ref 3.5–5.1)
Sodium: 137 mmol/L (ref 135–145)

## 2024-06-04 LAB — CBC
HCT: 27.1 % — ABNORMAL LOW (ref 36.0–46.0)
Hemoglobin: 9.3 g/dL — ABNORMAL LOW (ref 12.0–15.0)
MCH: 25.3 pg — ABNORMAL LOW (ref 26.0–34.0)
MCHC: 34.3 g/dL (ref 30.0–36.0)
MCV: 73.6 fL — ABNORMAL LOW (ref 80.0–100.0)
Platelets: 164 K/uL (ref 150–400)
RBC: 3.68 MIL/uL — ABNORMAL LOW (ref 3.87–5.11)
RDW: 19.2 % — ABNORMAL HIGH (ref 11.5–15.5)
WBC: 13.4 K/uL — ABNORMAL HIGH (ref 4.0–10.5)
nRBC: 0.3 % — ABNORMAL HIGH (ref 0.0–0.2)

## 2024-06-04 LAB — GLUCOSE, CAPILLARY
Glucose-Capillary: 97 mg/dL (ref 70–99)
Glucose-Capillary: 132 mg/dL — ABNORMAL HIGH (ref 70–99)
Glucose-Capillary: 203 mg/dL — ABNORMAL HIGH (ref 70–99)
Glucose-Capillary: 98 mg/dL (ref 70–99)

## 2024-06-04 LAB — MAGNESIUM: Magnesium: 2 mg/dL (ref 1.7–2.4)

## 2024-06-04 MED ORDER — IOHEXOL 350 MG/ML SOLN
75.0000 mL | Freq: Once | INTRAVENOUS | Status: AC | PRN
  Administered 2024-06-04: 75 mL via INTRAVENOUS

## 2024-06-04 MED ORDER — OXYCODONE HCL 5 MG PO TABS
5.0000 mg | ORAL_TABLET | Freq: Once | ORAL | Status: AC
  Administered 2024-06-04: 5 mg via ORAL
  Filled 2024-06-04: qty 1

## 2024-06-04 NOTE — Progress Notes (Signed)
 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  Overnight Events: Pt complained of increasing left leg pain and was given 5mg  oxycodone    The pt was seen and examined at the bedside this morning. She states that she is feeling good this morning and feels like she is breathing better. She is complaining of Left leg pain again his morning, stating that is appear more swollen than yesterday. She also reports that it hurts to the touch. All other questions and concerns were addressed at this time.   Objective:  Vital signs in last 24 hours: Vitals:   06/04/24 0324 06/04/24 0500 06/04/24 0906 06/04/24 1044  BP:   (!) 144/67 121/66  Pulse:  66 93 79  Resp: 20     Temp: 98.3 F (36.8 C)  97.8 F (36.6 C) 98.2 F (36.8 C)  TempSrc: Oral  Oral Oral  SpO2: 98%  92% 100%  Weight:  52 kg    Height:       Supplemental O2: HFNC SpO2: 100 % O2 Flow Rate (L/min): 25 L/min FiO2 (%): 65 %   Physical Exam:   Const: Awake, alert in NAD,  HENT: Normocephalic, atraumatic, mucus membranes moist with HFNC in place Card: RRR, No pitting edema on LE's bilaterally  Resp: LCTAB after finishing breathing treatment.  Abd: Soft, NTND,  Extremities: Warm, left thigh ttp with obvious edema from inguinal fold to knee. Warm to touch without signs of breakage in skin.    Filed Weights   06/02/24 0500 06/03/24 0404 06/04/24 0500  Weight: 52.5 kg 49.3 kg 52 kg     Intake/Output Summary (Last 24 hours) at 06/04/2024 1613 Last data filed at 06/04/2024 0900 Gross per 24 hour  Intake 393 ml  Output --  Net 393 ml   Net IO  Since Admission: 7,411.27 mL [06/04/24 1613]  Recent Labs    06/03/24 2100 06/04/24 0631 06/04/24 1042  GLUCAP 108* 97 132*     Pertinent Labs:    Latest Ref Rng & Units 06/04/2024    3:42 AM 06/03/2024    3:43 AM 06/02/2024    2:45 AM  CBC  WBC 4.0 - 10.5 K/uL 13.4  9.1  9.3   Hemoglobin 12.0 - 15.0 g/dL 9.3  9.8  89.7   Hematocrit 36.0 - 46.0 % 27.1  28.7  29.8   Platelets 150 - 400 K/uL 164  154  159        Latest Ref Rng & Units 06/04/2024    3:42 AM 06/03/2024    3:43 AM 06/02/2024    2:45 AM  CMP  Glucose 70 - 99 mg/dL 890  87  861   BUN 8 - 23 mg/dL 23  30  29    Creatinine 0.44 - 1.00 mg/dL 8.67  8.48  8.66   Sodium 135 - 145 mmol/L 137  138  137   Potassium 3.5 - 5.1 mmol/L 3.8  4.4  4.8   Chloride 98 - 111 mmol/L 102  108  106   CO2 22 - 32 mmol/L 24  21  22    Calcium  8.9 - 10.3 mg/dL 8.9  8.9  9.0     Imaging: No results found.  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   #Acute on Chronic hypoxic respiratory failure( 4-6 L supplemental oxygen ) # COVID-pneumonia. # Bronchopneumonia # Asthma-COPD overlap syndrome Patient is improving ini respiratory status overall, but is still requiring significant supplemental oxygen . Currently on 65% FiO2 and 25 L/min on HHFNC. -s/p 7 day course Zosyn   for PNA coverage, Completed decadron  on 07/13. -Will continue baricitinib  for 14 day course through 7/20 -LE U/S without evidence of deep vein thrombosis 7/14 - Airborne/contact precaution. - Continue with Brovana /Yupelri /Pulmicort naomia needed DuoNebs. -Pt has significant history of COPD, would recommend SpO2 is 88-92%, however she is consistently 99-100%. Will work with RT to wean pt down and achieve recommended SpO2.  #Left leg pain -Pt continues to have left leg pain however today there is obvious swelling from inguinal fold to knee. It is warm to the touch without breaks in skin or discoloration. Could potentially be a hematoma or less likely an acute infection. Will order CT will contrast to evaluate soft tissues.   #Leukocytosis WBC 13.4 this morning. Likely from initiating prednisone  yesterday. Low suspicion for infection due to pt remaining afebrile and recent completion of 7day course of zosyn . However pt complaining of left leg pain and there is an obvious swelling from the inguinal fold to the knee. Will obtain CT with contrast to evaluate and rule out acute infection.  #AKI on CKD 3A #S/p kidney transplant 2012. SCr back to baseline, baseline has been between 1.2-1.7.  AKI is likely ischemic ATN, in the setting of sepsis and hypotension.  She is on tacrolimus  for history of kidney transplant. - Strict ins and outs - CW tacrolimus  4 mg twice daily. - resumed home prednisone  5 mg. - Daily RFP  # Hypertension -Continue with amlodipine  10 mg, and metoprolol  50 mg twice daily.  #Coronary artery disease. #History of diastolic CHF(EF 60 to 65%, G2 DD) - Continue aspirin  81 mg and Zetia  10 mg daily.  Type 2 diabetes A1c 6.8 on 7/6.  Blood glucose stable.  Discontinued semglee  due to hypoglycemia. - SSI. - CBG  #GERD - Famotidine  20 mg daily.  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  Diet: Renal  diet IVF: PO intake VTE: SCDs Start: 05/24/24 2222 heparin  injection 5,000 Units Start: 05/24/24 1730 Code: Full PT/OT: Pending ID:  Anti-infectives (From admission, onward)    Start     Dose/Rate Route Frequency Ordered Stop   05/28/24 1000  piperacillin -tazobactam (ZOSYN ) IVPB 3.375 g        3.375 g 12.5 mL/hr over 240 Minutes Intravenous Every 8 hours 05/28/24 0848 05/30/24 2203   05/25/24 2200  piperacillin -tazobactam (ZOSYN ) IVPB 2.25 g  Status:  Discontinued        2.25 g 100 mL/hr over 30 Minutes Intravenous Every 8 hours 05/25/24  1436 05/28/24 0848   05/25/24 1000  remdesivir  100 mg in sodium chloride  0.9 % 100 mL IVPB  Status:  Discontinued       Placed in Followed by Linked Group   100 mg 200 mL/hr over 30 Minutes Intravenous Daily 05/24/24 2133 05/25/24 1254   05/24/24 2300  remdesivir  200 mg in sodium chloride  0.9% 250 mL IVPB       Placed in Followed by Linked Group   200 mg 580 mL/hr over 30 Minutes Intravenous Once 05/24/24 2133 05/25/24 0721   05/24/24 2200  linezolid  (ZYVOX ) IVPB 600 mg  Status:  Discontinued        600 mg 300 mL/hr over 60 Minutes Intravenous Every 12 hours 05/24/24 1722 05/26/24 1104   05/24/24 2100  piperacillin -tazobactam (ZOSYN ) IVPB 3.375 g  Status:  Discontinued        3.375 g 12.5 mL/hr over 240 Minutes Intravenous Every 8 hours 05/24/24 1728 05/25/24 1436   05/24/24 1500  cefTRIAXone  (ROCEPHIN ) 1 g in sodium chloride  0.9 % 100 mL IVPB        1 g 200 mL/hr over 30 Minutes Intravenous  Once 05/24/24 1450 05/24/24 1544   05/24/24 1500  doxycycline  (VIBRAMYCIN ) 100 mg in sodium chloride  0.9 % 250 mL IVPB  Status:  Discontinued        100 mg 125 mL/hr over 120 Minutes Intravenous  Once 05/24/24 1450 05/24/24 1726      Anticipated discharge to pending medical stabilization.  Myrna Bitters, DO 06/04/2024, 4:13 PM Pager: [phone] [facility]  Please contact the on call pager after 5 pm and on weekends at  [phone].

## 2024-06-04 NOTE — Progress Notes (Signed)
 Pt weaned to Marine 12 with spo2 of 95%. Tol well. Heated high flow left on standby. RT will continue to monitor.

## 2024-06-04 NOTE — Progress Notes (Signed)
 PT Cancellation Note  Patient Details Name: [name] MRN: [id number] DOB: 1940-12-06   Cancelled Treatment:    Reason Eval/Treat Not Completed: (P) Patient at procedure or test/unavailable (Pt off unit for CT of LLE.) Will continue efforts per PT plan of care as schedule permits.   Connell HERO [name] 06/04/2024, 4:19 PM

## 2024-06-04 NOTE — Progress Notes (Signed)
 We were paged at 2:42 AM for patient's left thigh pain and swelling. On our evaluation of the patient, there was a swelling in left anterior thigh region. There was no fluctuance or erythema in the region.  Patient did endorse tenderness on palpation of the region. Pt denies any fall or trauma in the region.  She denies fevers and chills. We are not concerned for an abscess or cellulitis. Hematoma can be a possibility since patient's last hemoglobin level was 9.8, which was slightly lower than her previous levels of 10.2, 10.8. Hemoglobin level should be evaluated in the morning to look for a decrease.  If hemoglobin is decreased in morning, could consider US  to evaluate for hematoma. Voltaren  gel was applied in the region but patient complained of more pain. We gave her 5 mg of oxycodone  and a warm compress in the region of pain.

## 2024-06-04 NOTE — Plan of Care (Signed)
  Problem: Education: Goal: Knowledge of risk factors and measures for prevention of condition will improve Outcome: Progressing   Problem: Coping: Goal: Psychosocial and spiritual needs will be supported Outcome: Progressing   Problem: Respiratory: Goal: Will maintain a patent airway Outcome: Progressing Goal: Complications related to the disease process, condition or treatment will be avoided or minimized Outcome: Progressing   Problem: Education: Goal: Knowledge of General Education information will improve Description: Including pain rating scale, medication(s)/side effects and non-pharmacologic comfort measures Outcome: Progressing   Problem: Health Behavior/Discharge Planning: Goal: Ability to manage health-related needs will improve Outcome: Progressing   Problem: Clinical Measurements: Goal: Ability to maintain clinical measurements within normal limits will improve Outcome: Progressing Goal: Will remain free from infection Outcome: Progressing Goal: Diagnostic test results will improve Outcome: Progressing Goal: Cardiovascular complication will be avoided Outcome: Progressing   Problem: Activity: Goal: Risk for activity intolerance will decrease Outcome: Progressing   Problem: Nutrition: Goal: Adequate nutrition will be maintained Outcome: Progressing   Problem: Coping: Goal: Level of anxiety will decrease Outcome: Progressing   Problem: Elimination: Goal: Will not experience complications related to bowel motility Outcome: Progressing Goal: Will not experience complications related to urinary retention Outcome: Progressing   Problem: Pain Managment: Goal: General experience of comfort will improve and/or be controlled Outcome: Progressing   Problem: Safety: Goal: Ability to remain free from injury will improve Outcome: Progressing   Problem: Skin Integrity: Goal: Risk for impaired skin integrity will decrease Outcome: Progressing   Problem:  Education: Goal: Ability to demonstrate management of disease process will improve Outcome: Progressing Goal: Ability to verbalize understanding of medication therapies will improve Outcome: Progressing Goal: Individualized Educational Video(s) Outcome: Progressing   Problem: Activity: Goal: Capacity to carry out activities will improve Outcome: Progressing   Problem: Cardiac: Goal: Ability to achieve and maintain adequate cardiopulmonary perfusion will improve Outcome: Progressing   Problem: Education: Goal: Ability to describe self-care measures that may prevent or decrease complications (Diabetes Survival Skills Education) will improve Outcome: Progressing Goal: Individualized Educational Video(s) Outcome: Progressing   Problem: Coping: Goal: Ability to adjust to condition or change in health will improve Outcome: Progressing   Problem: Fluid Volume: Goal: Ability to maintain a balanced intake and output will improve Outcome: Progressing   Problem: Health Behavior/Discharge Planning: Goal: Ability to identify and utilize available resources and services will improve Outcome: Progressing Goal: Ability to manage health-related needs will improve Outcome: Progressing   Problem: Metabolic: Goal: Ability to maintain appropriate glucose levels will improve Outcome: Progressing   Problem: Nutritional: Goal: Maintenance of adequate nutrition will improve Outcome: Progressing Goal: Progress toward achieving an optimal weight will improve Outcome: Progressing   Problem: Skin Integrity: Goal: Risk for impaired skin integrity will decrease Outcome: Progressing   Problem: Tissue Perfusion: Goal: Adequacy of tissue perfusion will improve Outcome: Progressing   Problem: Clinical Measurements: Goal: Respiratory complications will improve Outcome: Not Progressing

## 2024-06-04 NOTE — Progress Notes (Signed)
 Occupational Therapy Treatment Patient Details Name: [name] MRN: [id number] DOB: 21-Apr-1941 Today's Date: 06/04/2024   History of present illness Pt is an 83 y.o. F presenting to [facility] on 05/24/24 w/ dyspnea, dry cough, wheezing, and diarrhea for 3 days. Pt admitted w/ acute on chronic hypoxic respiratory failure due to COVID. 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   OT comments  Pt in today's session was limited due to pain in LLE. Pt was able to complete supine to sitting with supervision and then completed self feeding while sitting at EOB. Sh reported needing to use BSC and was able to complete transfers and peri care in sitting to standing with CGA. However, then reported increase in pain and on second transfer needing increase in time and RW and only able to lateral step to [facility]. Pt was agreeable to sit at EOB but then could not get comfortable and then required min assist to go from sitting to supine. At this time recommendation for [facility] but if pain continues and decline in mobility may need further rehab.        If plan is discharge home, recommend the following:  Assist for transportation;A little help with walking and/or transfers;A little help with bathing/dressing/bathroom   Equipment Recommendations  None recommended by OT    Recommendations for Other Services      Precautions / Restrictions Precautions Precautions: Fall;Other (comment) (watch o2) Precaution/Restrictions Comments: HHFNC Restrictions Weight Bearing Restrictions Per Provider Order: No       Mobility Bed Mobility Overal bed mobility: Needs Assistance Bed Mobility: Supine to Sit, Sit to Supine     Supine to sit: Supervision Sit to supine: Min assist   General bed mobility comments: due to LLE pain    Transfers Overall transfer level: Needs assistance Equipment used: Rolling walker (2 wheels),  None Transfers: Sit to/from Stand Sit to Stand: Contact guard assist     Step pivot transfers: Contact guard assist           Balance Overall balance assessment: Needs assistance Sitting-balance support: Feet supported Sitting balance-Leahy Scale: Good     Standing balance support: No upper extremity supported, During functional activity Standing balance-Leahy Scale: Fair Standing balance comment: due to L knee pain on second attempt used RW while completion of lateral steps to the [facility]                           ADL either performed or assessed with clinical judgement   ADL Overall ADL's : Needs assistance/impaired Eating/Feeding: Set up;Sitting   Grooming: Set up;Sitting   Upper Body Bathing: Set up;Sitting   Lower Body Bathing: Contact guard assist;Sit to/from stand   Upper Body Dressing : Set up;Sitting   Lower Body Dressing: Contact guard assist;Sit to/from stand   Toilet Transfer: Contact guard assist;Cueing for safety;Cueing for sequencing   Toileting- Clothing Manipulation and Hygiene: Contact guard assist       Functional mobility during ADLs: Contact guard assist;Rolling walker (2 wheels)      Extremity/Trunk Assessment Upper Extremity Assessment Upper Extremity Assessment: Generalized weakness   Lower Extremity Assessment Lower Extremity Assessment: Defer to PT evaluation        Vision   Vision Assessment?: Wears glasses for driving   Perception Perception Perception: Not tested   Praxis Praxis Praxis: Not tested   Communication Communication Communication: No apparent difficulties  Cognition Arousal: Alert Behavior During Therapy: WFL for tasks assessed/performed Cognition: No apparent impairments                               Following commands: Intact        Cueing   Cueing Techniques: Verbal cues  Exercises      Shoulder Instructions       General Comments 15L HHFNC    Pertinent Vitals/  Pain       Pain Assessment Pain Assessment: Faces Pain Location: L knee Pain Descriptors / Indicators: Aching, Discomfort, Grimacing, Guarding Pain Intervention(s): Limited activity within patient's tolerance, Monitored during session, Other (comment) (nurse applied [place] in session)  Home Living                                          Prior Functioning/Environment              Frequency  Min 2X/week        Progress Toward Goals  OT Goals(current goals can now be found in the care plan section)  Progress towards OT goals: Progressing toward goals  Acute Rehab OT Goals Patient Stated Goal: to go home OT Goal Formulation: With patient Time For Goal Achievement: 06/12/24 Potential to Achieve Goals: Good  Plan      Co-evaluation                 AM-PAC OT 6 Clicks Daily Activity     Outcome Measure   Help from another person eating meals?: None Help from another person taking care of personal grooming?: A Little Help from another person toileting, which includes using toliet, bedpan, or urinal?: A Little Help from another person bathing (including washing, rinsing, drying)?: A Little Help from another person to put on and taking off regular upper body clothing?: None Help from another person to put on and taking off regular lower body clothing?: A Little 6 Click Score: 20    End of Session Equipment Utilized During Treatment: Rolling walker (2 wheels);Gait belt  OT Visit Diagnosis: Unsteadiness on feet (R26.81)   Activity Tolerance No increased pain   Patient Left in bed;with call bell/phone within reach;with bed alarm set   Nurse Communication Mobility status        Time: 9152-9075 OT Time Calculation (min): 37 min  Charges: OT General Charges $OT Visit: 1 Visit OT Treatments $Self Care/Home Management : 23-37 mins  Warrick POUR OTR/L  Acute Rehab Services  [phone] office number   Warrick Berber 06/04/2024, 9:30 AM

## 2024-06-05 DIAGNOSIS — J1282 Pneumonia due to coronavirus disease 2019: Secondary | ICD-10-CM | POA: Insufficient documentation

## 2024-06-05 DIAGNOSIS — T148XXA Other injury of unspecified body region, initial encounter: Secondary | ICD-10-CM | POA: Insufficient documentation

## 2024-06-05 LAB — RENAL FUNCTION PANEL
Albumin: 2.7 g/dL — ABNORMAL LOW (ref 3.5–5.0)
Anion gap: 9 (ref 5–15)
BUN: 23 mg/dL (ref 8–23)
CO2: 24 mmol/L (ref 22–32)
Calcium: 8.9 mg/dL (ref 8.9–10.3)
Chloride: 103 mmol/L (ref 98–111)
Creatinine, Ser: 1.51 mg/dL — ABNORMAL HIGH (ref 0.44–1.00)
GFR, Estimated: 35 mL/min — ABNORMAL LOW (ref >60)
Glucose, Bld: 122 mg/dL — ABNORMAL HIGH (ref 70–99)
Phosphorus: 3.9 mg/dL (ref 2.5–4.6)
Potassium: 4.7 mmol/L (ref 3.5–5.1)
Sodium: 136 mmol/L (ref 135–145)

## 2024-06-05 LAB — CBC
HCT: 23.3 % — ABNORMAL LOW (ref 36.0–46.0)
Hemoglobin: 7.9 g/dL — ABNORMAL LOW (ref 12.0–15.0)
MCH: 25.3 pg — ABNORMAL LOW (ref 26.0–34.0)
MCHC: 33.9 g/dL (ref 30.0–36.0)
MCV: 74.7 fL — ABNORMAL LOW (ref 80.0–100.0)
Platelets: 172 K/uL (ref 150–400)
RBC: 3.12 MIL/uL — ABNORMAL LOW (ref 3.87–5.11)
RDW: 20 % — ABNORMAL HIGH (ref 11.5–15.5)
WBC: 16.5 K/uL — ABNORMAL HIGH (ref 4.0–10.5)
nRBC: 0.4 % — ABNORMAL HIGH (ref 0.0–0.2)

## 2024-06-05 LAB — GLUCOSE, CAPILLARY
Glucose-Capillary: 122 mg/dL — ABNORMAL HIGH (ref 70–99)
Glucose-Capillary: 174 mg/dL — ABNORMAL HIGH (ref 70–99)
Glucose-Capillary: 176 mg/dL — ABNORMAL HIGH (ref 70–99)
Glucose-Capillary: 87 mg/dL (ref 70–99)

## 2024-06-05 MED ORDER — ACETAMINOPHEN 500 MG PO TABS
1000.0000 mg | ORAL_TABLET | Freq: Four times a day (QID) | ORAL | Status: DC
  Administered 2024-06-05 – 2024-06-09 (×13): 1000 mg via ORAL
  Filled 2024-06-05 (×15): qty 2

## 2024-06-05 MED ORDER — OXYCODONE HCL 5 MG PO TABS
5.0000 mg | ORAL_TABLET | Freq: Once | ORAL | Status: AC
  Administered 2024-06-05: 5 mg via ORAL
  Filled 2024-06-05: qty 1

## 2024-06-05 MED ORDER — HYDROMORPHONE HCL 1 MG/ML IJ SOLN
0.5000 mg | Freq: Four times a day (QID) | INTRAMUSCULAR | Status: DC | PRN
  Administered 2024-06-05: 0.5 mg via INTRAVENOUS
  Filled 2024-06-05: qty 0.5

## 2024-06-05 MED ORDER — HYDROMORPHONE HCL 2 MG PO TABS: 1.0000 mg | ORAL_TABLET | Freq: Four times a day (QID) | ORAL | Status: DC | PRN

## 2024-06-05 NOTE — Plan of Care (Signed)
  Problem: Education: Goal: Knowledge of risk factors and measures for prevention of condition will improve Outcome: Progressing   Problem: Coping: Goal: Psychosocial and spiritual needs will be supported Outcome: Progressing   Problem: Respiratory: Goal: Will maintain a patent airway Outcome: Progressing Goal: Complications related to the disease process, condition or treatment will be avoided or minimized Outcome: Progressing   Problem: Education: Goal: Knowledge of General Education information will improve Description: Including pain rating scale, medication(s)/side effects and non-pharmacologic comfort measures Outcome: Progressing   Problem: Health Behavior/Discharge Planning: Goal: Ability to manage health-related needs will improve Outcome: Progressing   Problem: Clinical Measurements: Goal: Ability to maintain clinical measurements within normal limits will improve Outcome: Progressing Goal: Will remain free from infection Outcome: Progressing Goal: Diagnostic test results will improve Outcome: Progressing Goal: Respiratory complications will improve Outcome: Progressing Goal: Cardiovascular complication will be avoided Outcome: Progressing   Problem: Activity: Goal: Risk for activity intolerance will decrease Outcome: Progressing   Problem: Nutrition: Goal: Adequate nutrition will be maintained Outcome: Progressing   Problem: Coping: Goal: Level of anxiety will decrease Outcome: Progressing   Problem: Elimination: Goal: Will not experience complications related to bowel motility Outcome: Progressing Goal: Will not experience complications related to urinary retention Outcome: Progressing   Problem: Pain Managment: Goal: General experience of comfort will improve and/or be controlled Outcome: Progressing   Problem: Safety: Goal: Ability to remain free from injury will improve Outcome: Progressing   Problem: Skin Integrity: Goal: Risk for impaired  skin integrity will decrease Outcome: Progressing   Problem: Education: Goal: Ability to demonstrate management of disease process will improve Outcome: Progressing Goal: Ability to verbalize understanding of medication therapies will improve Outcome: Progressing Goal: Individualized Educational Video(s) Outcome: Progressing   Problem: Activity: Goal: Capacity to carry out activities will improve Outcome: Progressing   Problem: Cardiac: Goal: Ability to achieve and maintain adequate cardiopulmonary perfusion will improve Outcome: Progressing   Problem: Education: Goal: Ability to describe self-care measures that may prevent or decrease complications (Diabetes Survival Skills Education) will improve Outcome: Progressing Goal: Individualized Educational Video(s) Outcome: Progressing   Problem: Coping: Goal: Ability to adjust to condition or change in health will improve Outcome: Progressing   Problem: Fluid Volume: Goal: Ability to maintain a balanced intake and output will improve Outcome: Progressing   Problem: Health Behavior/Discharge Planning: Goal: Ability to identify and utilize available resources and services will improve Outcome: Progressing Goal: Ability to manage health-related needs will improve Outcome: Progressing   Problem: Metabolic: Goal: Ability to maintain appropriate glucose levels will improve Outcome: Progressing   Problem: Nutritional: Goal: Maintenance of adequate nutrition will improve Outcome: Progressing Goal: Progress toward achieving an optimal weight will improve Outcome: Progressing   Problem: Skin Integrity: Goal: Risk for impaired skin integrity will decrease Outcome: Progressing   Problem: Tissue Perfusion: Goal: Adequacy of tissue perfusion will improve Outcome: Progressing

## 2024-06-05 NOTE — Progress Notes (Signed)
 HD#12 Subjective:  A 83 year old female patient with HTN, ESRD s/p renal transplant 2012, 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 (quit smoking 20 yrs ago), CAD/CABG, with multiple recent hospitalizations for ECOPD/asthma and CHF exacerbation, D-CHF (EF 60% with 2 G2DD), who presented on 7/5 for worsening shortness of breath, diarrhea for 3 days, and she was found to have COVID-pneumonia, requiring transfer to the ICU on 7/5-7/13.  Overnight Events: Pt continued to complain about LLE pain  Pt seen and examined at the bedside this morning. She reports that she is very very tired and could not sleep last night due to the pain in her leg. She is happy that her breathing is so much better but is upset about her leg. She became teary during our conversation and asked if there was something that she could have for the pain. She would like to rest today.     Objective:  Vital signs in last 24 hours: Vitals:   06/04/24 2212 06/04/24 2324 06/05/24 0326 06/05/24 0339  BP:  120/64 119/61   Pulse:  76 80   Resp:  20 20 (!) 24  Temp:  98.4 F (36.9 C) 98.6 F (37 C)   TempSrc:  Oral Oral   SpO2: 93% 100% 100%   Weight:    50.5 kg  Height:       Supplemental O2: HFNC SpO2: 100 % O2 Flow Rate (L/min): 12 L/min FiO2 (%): 65 %   Physical Exam:   Const: Awake, alert in NAD,  HENT: Normocephalic, atraumatic, mucus membranes moist with Rachel in place Card: RRR, No pitting edema on LE's bilaterally  Resp: Decreased at the bases. No wheezes heard Abd: Soft, NTND Extremities: Warm, left thigh ttp with obvious edema from inguinal fold to knee. Warm to touch without signs of breakage in skin. Similar in circumference to yesterday's exam   Filed Weights   06/03/24 0404 06/04/24 0500 06/05/24 0339  Weight: 49.3 kg 52 kg 50.5 kg     Intake/Output Summary (Last 24 hours) at 06/05/2024 0802 Last data filed at 06/05/2024 0600 Gross per 24 hour  Intake 1093 ml  Output 600 ml  Net 493 ml   Net IO  Since Admission: 7,661.27 mL [06/05/24 0802]  Recent Labs    06/04/24 1645 06/04/24 2104 06/05/24 0610  GLUCAP 203* 98 122*     Pertinent Labs:    Latest Ref Rng & Units 06/05/2024    3:20 AM 06/04/2024    3:42 AM 06/03/2024    3:43 AM  CBC  WBC 4.0 - 10.5 K/uL 16.5  13.4  9.1   Hemoglobin 12.0 - 15.0 g/dL 7.9  9.3  9.8   Hematocrit 36.0 - 46.0 % 23.3  27.1  28.7   Platelets 150 - 400 K/uL 172  164  154        Latest Ref Rng & Units 06/05/2024    3:20 AM 06/04/2024    3:42 AM 06/03/2024    3:43 AM  CMP  Glucose 70 - 99 mg/dL 877  890  87   BUN 8 - 23 mg/dL 23  23  30    Creatinine 0.44 - 1.00 mg/dL 8.48  8.67  8.48   Sodium 135 - 145 mmol/L 136  137  138   Potassium 3.5 - 5.1 mmol/L 4.7  3.8  4.4   Chloride 98 - 111 mmol/L 103  102  108   CO2 22 - 32 mmol/L 24  24  21  Calcium  8.9 - 10.3 mg/dL 8.9  8.9  8.9     Imaging: CT FEMUR LEFT W CONTRAST Result Date: 06/04/2024 CLINICAL DATA:  Soft tissue mass left anterior thigh for 2 days. EXAM: CT OF THE LOWER LEFT EXTREMITY WITH CONTRAST TECHNIQUE: Multidetector CT imaging of the lower left extremity was performed according to the standard protocol following intravenous contrast administration. RADIATION DOSE REDUCTION: This exam was performed according to the departmental dose-optimization program which includes automated exposure control, adjustment of the mA and/or kV according to patient size and/or use of iterative reconstruction technique. CONTRAST:  75mL OMNIPAQUE  IOHEXOL  350 MG/ML SOLN COMPARISON:  None Available. FINDINGS: The patient's anterior thigh mass corresponds to two large intramuscular hematomas centered in the quadriceps compartment. The larger more superior hematoma begins at the level of the left femoral neck and extends down into the mid thigh measuring approximately 20 x 4.8 x 3.2 cm. The second hematoma is located more deeply and inferiorly and measures approximately 10 x 2.3 x 1.8 cm. Both of these demonstrate  areas of fluid fluid levels consistent with settling blood products. 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 No complicating features are identified. The left femur is intact. No significant knee joint pathology. The quadriceps and patellar tendons are intact. Evidence of prior patellar fracture fixation. No knee joint effusion. Advanced vascular disease noted. IMPRESSION: 1. The patient's anterior thigh mass corresponds to two large intramuscular hematomas centered in the quadriceps compartment. 2. The larger more superior hematoma begins at the level of the left femoral neck and extends down into the mid thigh measures approximately 20 x 4.8 x 3.2 cm. 3. The second hematoma is located more deeply and inferiorly and measures approximately 10 x 2.3 x 1.8 cm. 4. 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 No complicating features are identified. 5. Advanced vascular disease. Electronically Signed   By: MYRTIS Stammer M.D.   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   #Acute on Chronic hypoxic respiratory failure( 4-6 L supplemental oxygen ) # COVID-pneumonia. # Bronchopneumonia # Asthma-COPD overlap syndrome -s/p 7 day course Zosyn  for PNA coverage, Completed decadron  on 07/13. -Will continue baricitinib  for  14 day course through 7/20 -LE U/S without evidence of deep vein thrombosis 7/14 - Continue with Brovana /Yupelri /Pulmicort /as needed DuoNebs. -Patient continues to improve significantly. Weaned down to 12L [place] last night. Home requirements are 4L resting and 6L when active. Attempted to wean to 6L while in the room however she dropped to mid 80's. Will continue to wean slowly to maintain 88-92%  #Left leg pain -Pt continues to have left leg pain however today there is obvious swelling from inguinal fold to knee. It is warm to the touch without breaks in skin or discoloration. Could potentially be a hematoma or less likely an acute infection. Will order CT will contrast to evaluate soft tissues. -CT  7/16 showed two large hematomas extending from the L femoral neck to just above the knee. Today on exam, looks unchanged from yesterday. Will d/c heparin  today and place ice compresses intermittently on the thigh. -0.5mg  IV dilaudid  q6 PRN ordered for pain management.  -Hgb 7.9 today, dropped from 9.3 yesterday. Will continue to trend closely.   #Leukocytosis WBC 16.5 this morning. Low suspicion for infection due to pt remaining afebrile with respiratory symptoms improving and recent completion of 7day course of zosyn . CT L femur was not concerning for soft tissue infection. Pt also denies urinary symptoms. Will continue to monitor closely and obtain CBC with dif.   #AKI on CKD 3A #S/p kidney transplant 2012. S/p renal transplant in 2012. SCr back to baseline, between 1.2-1.7.  AKI is likely ischemic ATN, in the setting of sepsis and hypotension.   - Strict ins and outs - CW tacrolimus  4 mg twice daily. - resumed home prednisone  5 mg. - Daily RFP  # Hypertension -Continue with amlodipine  10 mg, and metoprolol  50 mg twice daily.  #Coronary artery disease. #History of diastolic CHF(EF 60 to 65%, G2 DD) - Continue aspirin  81 mg and Zetia  10 mg daily.  Type 2 diabetes A1c 6.8 on 7/6.  Blood  glucose stable.  Discontinued semglee  due to hypoglycemia. - SSI. - CBG  #GERD - Famotidine  20 mg daily.  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  Diet: Renal diet IVF: PO intake VTE: SCDs Start: 05/24/24 2222 Code: Full PT/OT: Pending ID:  Anti-infectives (From admission, onward)    Start     Dose/Rate Route Frequency Ordered Stop   05/28/24 1000  piperacillin -tazobactam (ZOSYN ) IVPB 3.375 g        3.375 g 12.5 mL/hr over 240 Minutes Intravenous Every 8 hours 05/28/24 0848 05/30/24 2203   05/25/24 2200  piperacillin -tazobactam (ZOSYN ) IVPB 2.25 g  Status:  Discontinued        2.25 g 100 mL/hr over 30 Minutes Intravenous Every 8 hours 05/25/24 1436 05/28/24 0848   05/25/24 1000  remdesivir  100 mg in sodium chloride  0.9 % 100 mL IVPB  Status:  Discontinued       Placed in Followed by Linked Group   100 mg 200 mL/hr over 30 Minutes Intravenous Daily 05/24/24 2133 05/25/24 1254   05/24/24 2300  remdesivir  200 mg in sodium chloride  0.9% 250 mL IVPB       Placed in Followed by Linked Group   200 mg 580 mL/hr over 30 Minutes Intravenous Once 05/24/24 2133 05/25/24 0721   05/24/24 2200  linezolid  (ZYVOX ) IVPB 600 mg  Status:  Discontinued        600 mg 300 mL/hr over 60 Minutes Intravenous Every 12 hours 05/24/24 1722 05/26/24 1104   05/24/24 2100  piperacillin -tazobactam (ZOSYN ) IVPB 3.375 g  Status:  Discontinued        3.375 g 12.5 mL/hr over 240 Minutes Intravenous Every 8 hours 05/24/24 1728 05/25/24 1436   05/24/24 1500  cefTRIAXone  (ROCEPHIN ) 1 g in sodium chloride  0.9 % 100 mL IVPB        1 g 200 mL/hr over 30 Minutes Intravenous  Once 05/24/24 1450 05/24/24 1544   05/24/24 1500  doxycycline  (VIBRAMYCIN ) 100 mg in sodium chloride  0.9 % 250 mL IVPB  Status:  Discontinued        100 mg 125 mL/hr over 120 Minutes Intravenous  Once 05/24/24 1450 05/24/24 1726      Anticipated discharge  to  pending medical stabilization.  Myrna Bitters, DO 06/05/2024, 8:02 AM Pager: [phone] [facility]  Please contact the on call pager after 5 pm and on weekends at [phone].

## 2024-06-05 NOTE — Progress Notes (Signed)
 Physical Therapy Treatment Patient Details Name: [name] MRN: [id number] DOB: 02-25-1941 Today's Date: 06/05/2024   History of Present Illness Pt is an 83 y.o. F presenting to [facility] on 05/24/24 w/ dyspnea, dry cough, wheezing, and diarrhea for 3 days. Pt admitted w/ acute on chronic hypoxic respiratory failure due to COVID. 7/16 LLE Contrast CT showing two large hematomas extending from the L femoral neck to just above the knee. MD recommend LLE WBAT and to place ice compresses intermittently on the thigh. 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    PT Comments  Pt received in supine, agreeable to therapy session with encouragement, with nausea/vomiting after transfer OOB to chair and c/o severe fatigue post-exertion. SpO2 WFL on 12L HF [place] when good pleth signal achieved and HR WFL. Pt c/o moderate pain from LLE hematoma and noted edema. RN notified of pt symptoms and mild apparent confusion with some difficulty with RW management/step sequencing. Discussed with supervising PT, pt may benefit from short term lower intensity post-acute rehab prior to DC home given increased recent deficits and assist level needed, but will need to discuss with pt/family next session pending progress in next 1-2 sessions.    If plan is discharge home, recommend the following: Help with stairs or ramp for entrance;A lot of help with walking and/or transfers;Supervision due to cognitive status;Assistance with cooking/housework;A little help with bathing/dressing/bathroom (mod cues)   Can travel by private vehicle     No  Equipment Recommendations  None recommended by PT    Recommendations for Other Services       Precautions / Restrictions Precautions Precautions: Fall;Other (comment) (watch o2) Recall of Precautions/Restrictions: Impaired Precaution/Restrictions Comments: Airborne/Contact  precs; LLE edema, watch O2 Restrictions Weight Bearing Restrictions Per Provider Order: No     Mobility  Bed Mobility Overal bed mobility: Needs Assistance Bed Mobility: Supine to Sit     Supine to sit: Contact guard, Used rails     General bed mobility comments: increased time/effort to perform guarding with LLE; use of rail to pull herself up    Transfers Overall transfer level: Needs assistance Equipment used: Rolling walker (2 wheels), None Transfers: Sit to/from Stand Sit to Stand: Min assist   Step pivot transfers: Min assist       General transfer comment: from EOB>RW>chair, pt ignoring cues to reach back prior to sitting down    Ambulation/Gait Ambulation/Gait assistance: Min assist Gait Distance (Feet): 5 Feet Assistive device: Rolling walker (2 wheels) Gait Pattern/deviations: Step-to pattern, Decreased weight shift to left, Decreased step length - left, Decreased step length - right, Decreased stance time - left, Trunk flexed, Shuffle       General Gait Details: Difficulty following instruction for directional navigation/step sequencing; needs manual assist to manage RW safely at times; unclear baseline cognition.   Stairs             Wheelchair Mobility     Tilt Bed    Modified Rankin (Stroke Patients Only)       Balance Overall balance assessment: Needs assistance Sitting-balance support: Feet supported, Single extremity supported Sitting balance-Leahy Scale: Fair Sitting balance - Comments: leaning toward R side where rail is; LLE pain   Standing balance support: During functional activity, Reliant on assistive device for balance, Bilateral upper extremity supported Standing balance-Leahy Scale: Poor Standing balance comment: heavy reliance on RW, likely due to LLE pain  Communication Communication Communication: Impaired Factors Affecting Communication: Difficulty expressing self;Other  (comment) (distracted due to pain vs nausea)  Cognition Arousal: Alert Behavior During Therapy: Flat affect   PT - Cognitive impairments: No family/caregiver present to determine baseline, Attention, Sequencing, Initiation, Problem solving, Safety/Judgement                       PT - Cognition Comments: Unclear of pt baseline; some slow processing and pt having difficulty sequencing stepping/poor RW management while pivoting OOB to chair. Pt was lying flat in supine with phone on her cheek and when PTA asked if she was on the phone, pt states I haven't called anyone but phone was off the hook. Pt inconsistent with reporting symptoms when asked. Following commands: Impaired Following commands impaired: Follows one step commands with increased time    Cueing Cueing Techniques: Verbal cues, Gestural cues, Tactile cues  Exercises General Exercises - Lower Extremity Ankle Circles/Pumps: AROM, Both, 10 reps, Seated Long Arc Quad: AROM, Both, 5 reps, Seated Hip ABduction/ADduction: Both, 5 reps, Supine, AROM, AAROM    General Comments General comments (skin integrity, edema, etc.): L thigh/knee edema; discussion on benefit of keeping BLE elevated/straight to reduce pooling/edema in limb; pt wtih episode of n/v, RN notified; chair alarm placed and activated for pt safety given mild apparent confusion today. BP and SpO2 WFL on 12L HFNC, poor signal while holding RW or using L hand where sensor is, when good waveform achieved, SpO2 WFL. pt reporting severe fatigue post-exertion.      Pertinent Vitals/Pain Pain Assessment Pain Assessment: 0-10 Pain Score: 5  Pain Location: L thigh and knee Pain Descriptors / Indicators: Aching, Discomfort, Grimacing, Guarding Pain Intervention(s): Limited activity within patient's tolerance, Monitored during session, Repositioned    Home Living                          Prior Function            PT Goals (current goals can now be found  in the care plan section) Acute Rehab PT Goals Patient Stated Goal: none stated; agrees to PT goals PT Goal Formulation: With patient Time For Goal Achievement: 06/12/24 Progress towards PT goals: Progressing toward goals    Frequency    Min 2X/week      PT Plan      Co-evaluation              AM-PAC PT 6 Clicks Mobility   Outcome Measure  Help needed turning from your back to your side while in a flat bed without using bedrails?: A Little Help needed moving from lying on your back to sitting on the side of a flat bed without using bedrails?: A Little Help needed moving to and from a bed to a chair (including a wheelchair)?: A Little Help needed standing up from a chair using your arms (e.g., wheelchair or bedside chair)?: A Little Help needed to walk in hospital room?: Total Help needed climbing 3-5 steps with a railing? : Total 6 Click Score: 14    End of Session Equipment Utilized During Treatment: Gait belt;Oxygen  Activity Tolerance: Patient limited by fatigue;Treatment limited secondary to medical complications (Comment);Other (comment) (episode n/v after pivot to chair limiting activity tolerance) Patient left: in chair;with call bell/phone within reach;with chair alarm set Nurse Communication: Mobility status;Other (comment) (episode n/v, assist level needed) PT Visit Diagnosis: Difficulty in walking, not elsewhere classified (R26.2);Other abnormalities of gait and  mobility (R26.89)     Time: 8566-8488 PT Time Calculation (min) (ACUTE ONLY): 38 min  Charges:    $Gait Training: 8-22 mins $Therapeutic Activity: 23-37 mins PT General Charges $$ ACUTE PT VISIT: 1 Visit                     Diago Haik P., PTA Acute Rehabilitation Services Secure Chat Preferred 9a-5:30pm Office: [phone]    Connell HERO [facility] 06/05/2024, 3:48 PM

## 2024-06-06 ENCOUNTER — Inpatient Hospital Stay (HOSPITAL_COMMUNITY)

## 2024-06-06 HISTORY — PX: IR US GUIDE VASC ACCESS RIGHT: IMG2390

## 2024-06-06 HISTORY — PX: IR ANGIOGRAM EXTREMITY LEFT: IMG651

## 2024-06-06 LAB — CBC WITH DIFFERENTIAL/PLATELET
Abs Immature Granulocytes: 0.62 K/uL — ABNORMAL HIGH (ref 0.00–0.07)
Basophils Absolute: 0 K/uL (ref 0.0–0.1)
Basophils Relative: 0 %
Eosinophils Absolute: 0.1 K/uL (ref 0.0–0.5)
Eosinophils Relative: 0 %
HCT: 17.9 % — ABNORMAL LOW (ref 36.0–46.0)
Hemoglobin: 6.1 g/dL — CL (ref 12.0–15.0)
Immature Granulocytes: 4 %
Lymphocytes Relative: 16 %
Lymphs Abs: 2.4 K/uL (ref 0.7–4.0)
MCH: 25.8 pg — ABNORMAL LOW (ref 26.0–34.0)
MCHC: 34.1 g/dL (ref 30.0–36.0)
MCV: 75.8 fL — ABNORMAL LOW (ref 80.0–100.0)
Monocytes Absolute: 1.7 K/uL — ABNORMAL HIGH (ref 0.1–1.0)
Monocytes Relative: 11 %
Neutro Abs: 10.9 K/uL — ABNORMAL HIGH (ref 1.7–7.7)
Neutrophils Relative %: 69 %
Platelets: 164 K/uL (ref 150–400)
RBC: 2.36 MIL/uL — ABNORMAL LOW (ref 3.87–5.11)
RDW: 20.3 % — ABNORMAL HIGH (ref 11.5–15.5)
WBC: 15.8 K/uL — ABNORMAL HIGH (ref 4.0–10.5)
nRBC: 0.6 % — ABNORMAL HIGH (ref 0.0–0.2)

## 2024-06-06 LAB — RENAL FUNCTION PANEL
Albumin: 2.3 g/dL — ABNORMAL LOW (ref 3.5–5.0)
Anion gap: 10 (ref 5–15)
BUN: 33 mg/dL — ABNORMAL HIGH (ref 8–23)
CO2: 22 mmol/L (ref 22–32)
Calcium: 8.5 mg/dL — ABNORMAL LOW (ref 8.9–10.3)
Chloride: 102 mmol/L (ref 98–111)
Creatinine, Ser: 2.44 mg/dL — ABNORMAL HIGH (ref 0.44–1.00)
GFR, Estimated: 19 mL/min — ABNORMAL LOW (ref >60)
Glucose, Bld: 128 mg/dL — ABNORMAL HIGH (ref 70–99)
Phosphorus: 6 mg/dL — ABNORMAL HIGH (ref 2.5–4.6)
Potassium: 4.6 mmol/L (ref 3.5–5.1)
Sodium: 134 mmol/L — ABNORMAL LOW (ref 135–145)

## 2024-06-06 LAB — CBC
HCT: 22.3 % — ABNORMAL LOW (ref 36.0–46.0)
Hemoglobin: 7.9 g/dL — ABNORMAL LOW (ref 12.0–15.0)
MCH: 27.6 pg (ref 26.0–34.0)
MCHC: 35.4 g/dL (ref 30.0–36.0)
MCV: 78 fL — ABNORMAL LOW (ref 80.0–100.0)
Platelets: 157 K/uL (ref 150–400)
RBC: 2.86 MIL/uL — ABNORMAL LOW (ref 3.87–5.11)
RDW: 19.6 % — ABNORMAL HIGH (ref 11.5–15.5)
WBC: 11.3 K/uL — ABNORMAL HIGH (ref 4.0–10.5)
nRBC: 0.4 % — ABNORMAL HIGH (ref 0.0–0.2)

## 2024-06-06 LAB — HEPATIC FUNCTION PANEL
ALT: 17 U/L (ref 0–44)
AST: 30 U/L (ref 15–41)
Albumin: 2.6 g/dL — ABNORMAL LOW (ref 3.5–5.0)
Alkaline Phosphatase: 35 U/L — ABNORMAL LOW (ref 38–126)
Bilirubin, Direct: 0.1 mg/dL (ref 0.0–0.2)
Indirect Bilirubin: 0.8 mg/dL (ref 0.3–0.9)
Total Bilirubin: 0.9 mg/dL (ref 0.0–1.2)
Total Protein: 5 g/dL — ABNORMAL LOW (ref 6.5–8.1)

## 2024-06-06 LAB — GLUCOSE, CAPILLARY
Glucose-Capillary: 113 mg/dL — ABNORMAL HIGH (ref 70–99)
Glucose-Capillary: 129 mg/dL — ABNORMAL HIGH (ref 70–99)
Glucose-Capillary: 124 mg/dL — ABNORMAL HIGH (ref 70–99)
Glucose-Capillary: 180 mg/dL — ABNORMAL HIGH (ref 70–99)

## 2024-06-06 LAB — PREPARE RBC (CROSSMATCH)

## 2024-06-06 LAB — RETICULOCYTES
Immature Retic Fract: 40.1 % — ABNORMAL HIGH (ref 2.3–15.9)
RBC.: 2.48 MIL/uL — ABNORMAL LOW (ref 3.87–5.11)
Retic Count, Absolute: 104.7 K/uL (ref 19.0–186.0)
Retic Ct Pct: 4.2 % — ABNORMAL HIGH (ref 0.4–3.1)

## 2024-06-06 MED ORDER — IOHEXOL 300 MG/ML  SOLN
150.0000 mL | Freq: Once | INTRAMUSCULAR | Status: AC | PRN
  Administered 2024-06-06: 40 mL via INTRA_ARTERIAL

## 2024-06-06 MED ORDER — MIDAZOLAM HCL 2 MG/2ML IJ SOLN
INTRAMUSCULAR | Status: AC
  Filled 2024-06-06: qty 2

## 2024-06-06 MED ORDER — LIDOCAINE HCL 1 % IJ SOLN
20.0000 mL | Freq: Once | INTRAMUSCULAR | Status: AC
  Administered 2024-06-06: 5 mL via INTRADERMAL

## 2024-06-06 MED ORDER — LACTATED RINGERS IV SOLN: INTRAVENOUS | Status: AC

## 2024-06-06 MED ORDER — LACTATED RINGERS IV SOLN: Freq: Once | INTRAVENOUS | Status: AC

## 2024-06-06 MED ORDER — MIDAZOLAM HCL 2 MG/2ML IJ SOLN
INTRAMUSCULAR | Status: AC | PRN
  Administered 2024-06-06: 1 mg via INTRAVENOUS

## 2024-06-06 MED ORDER — SODIUM CHLORIDE 0.9% IV SOLUTION: Freq: Once | INTRAVENOUS | Status: DC

## 2024-06-06 MED ORDER — FENTANYL CITRATE (PF) 100 MCG/2ML IJ SOLN
INTRAMUSCULAR | Status: AC
  Filled 2024-06-06: qty 2

## 2024-06-06 MED ORDER — LIDOCAINE HCL 1 % IJ SOLN
INTRAMUSCULAR | Status: AC
  Filled 2024-06-06: qty 20

## 2024-06-06 MED ORDER — IOHEXOL 300 MG/ML  SOLN
150.0000 mL | Freq: Once | INTRAMUSCULAR | Status: AC | PRN
  Administered 2024-06-06: 10 mL via INTRA_ARTERIAL

## 2024-06-06 NOTE — Progress Notes (Signed)
 T&C,  and 1 unit PRBC were ordered to be transfused. Awaiting  lab to draw T&S.

## 2024-06-06 NOTE — Progress Notes (Signed)
 Dr. Edgardo was made aware that [name] [phone] has a critical hemoglobin of 6.1.

## 2024-06-06 NOTE — Procedures (Signed)
  Procedure:  LLE arterigram  no bleed or other acute abnl Preprocedure diagnosis: The primary encounter diagnosis was Congestive heart failure, unspecified HF chronicity, unspecified heart failure type (HCC). Diagnoses of COPD exacerbation (HCC), Pneumonia due to infectious organism, unspecified laterality, unspecified part of lung, and Hypoxia were also pertinent to this visit. Postprocedure diagnosis: same EBL:    minimal Complications:   none immediate  See full dictation in [employer].  CHARM Toribio Faes MD Main # [phone] Pager  [phone] Mobile [phone]

## 2024-06-06 NOTE — Consult Note (Signed)
 Reason for Consult:Left thigh hematoma Referring Physician: Hadassah Ala Time called: [id number] Time at bedside: 1023   [name] is an 83 y.o. female.  HPI: [name] was admitted 12d ago with Covid PNA. Overnight 7/15-7/16 she began to have left thigh pain. This has continued to progress. CT showed a large left thigh hematoma. The pain continued to worsen and her hemoglobin continued to drop and orthopedic surgery was consulted today. She was only on prophylactic dose SQH and ASA in terms of blood thinners; they have been stopped.  Past Medical History:  Diagnosis Date  . 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 Asthma   . Cellulitis 12/19/2019   right upper arm  . Coronary artery disease    cath 11/2007, grafts patent /  Nuclear, June, 2011, prior inferior MI with mild peri-infarct ischemia, anterior breast attenuation, EF 67%, done for renal transplant assessment / Low level exercise/Lexiscan  Myoview  (11/2013): EF 67%, no ischemi; normal study  . Diabetes mellitus    type 2  . ESRD on dialysis [facility])    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, 2012  . Family history of breast cancer   . Family history of prostate cancer   . GERD (gastroesophageal reflux disease)   . GI bleed 11/26/2010   January, 2012 , AVM  . Gout   . History of methicillin resistant staphylococcus aureus (MRSA)   . Hyperlipidemia   . Hyperparathyroidism   . Hypertension   . Myocardial infarction (HCC)   . Osteoporosis   . Pneumonia 08/2010    Hospitalization, October, 2011  . Primary osteoarthritis, 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  . S/P kidney transplant    September, 2012, [facility]  . Subacromial impingement of 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    Past Surgical History:  Procedure Laterality Date  . ABDOMINAL HYSTERECTOMY  1980'S  . ARTERIOVENOUS GRAFT PLACEMENT Left 03/04/2007   forearm  . BIOPSY  09/30/2019   Procedure: BIOPSY;  Surgeon:  Dianna Specking, MD;  Location: [facility];  Service: Endoscopy;;  . BIOPSY  12/27/2021   Procedure: BIOPSY;  Surgeon: Dianna Specking, MD;  Location: [facility];  Service: Endoscopy;;  . BREAST EXCISIONAL BIOPSY Right    benign more than 10 yr ago  . BREAST SURGERY  YRS AGO   RT BREAST CYST REMOVED   . CARDIAC CATHETERIZATION  06/03/2002; 12/04/2007  . COLONOSCOPY WITH PROPOFOL  N/A 07/14/2014   Procedure: COLONOSCOPY WITH PROPOFOL ;  Surgeon: Specking KYM Dianna, MD;  Location: [facility];  Service: Endoscopy;  Laterality: N/A;  . COLONOSCOPY WITH PROPOFOL  N/A 09/30/2019   Procedure: COLONOSCOPY WITH PROPOFOL ;  Surgeon: Dianna Specking, MD;  Location: [facility];  Service: Endoscopy;  Laterality: N/A;  . COLONOSCOPY WITH PROPOFOL  N/A 12/27/2021   Procedure: COLONOSCOPY WITH PROPOFOL ;  Surgeon: Dianna Specking, MD;  Location: [facility];  Service: Endoscopy;  Laterality: N/A;  . CORONARY ARTERY BYPASS GRAFT  06/06/2002   x 4  . ESOPHAGOGASTRODUODENOSCOPY N/A 12/27/2021   Procedure: ESOPHAGOGASTRODUODENOSCOPY (EGD);  Surgeon: Dianna Specking, MD;  Location: [facility];  Service: Endoscopy;  Laterality: N/A;  . HEMOSTASIS CLIP PLACEMENT  12/27/2021   Procedure: HEMOSTASIS CLIP PLACEMENT;  Surgeon: Dianna Specking, MD;  Location: [facility];  Service: Endoscopy;;  . HOT HEMOSTASIS N/A 07/14/2014   Procedure: HOT HEMOSTASIS (ARGON PLASMA COAGULATION/BICAP);  Surgeon: Specking KYM Dianna, MD;  Location: [facility];  Service: Endoscopy;  Laterality: N/A;  . IR FLUORO GUIDE  CV LINE RIGHT  12/22/2019  . IR US  GUIDE VASC ACCESS RIGHT  12/22/2019  . KIDNEY TRANSPLANT Right 08/04/2011  . ORIF PATELLA Left 04/06/2016   Procedure: OPEN REDUCTION INTERNAL (ORIF) LEFT  PATELLA;  Surgeon: Toribio JULIANNA Chancy, MD;  Location: [facility];  Service: Orthopedics;  Laterality: Left;  . POLYPECTOMY  09/30/2019   Procedure: POLYPECTOMY;  Surgeon: Dianna Specking, MD;  Location: [facility];  Service: Endoscopy;;  . POLYPECTOMY  12/27/2021   Procedure: POLYPECTOMY;  Surgeon: Dianna Specking, MD;  Location: [facility];  Service: Endoscopy;;  . SHOULDER ARTHROSCOPY WITH ROTATOR CUFF REPAIR AND SUBACROMIAL DECOMPRESSION Left 08/03/2016   Procedure: LEFT SHOULDER ARTHROSCOPY WITH ROTATOR CUFF REPAIR AND SUBACROMIAL DECOMPRESSION with distal claviculectomy and extentsive debridement;  Surgeon: Toribio JULIANNA Chancy, MD;  Location: [facility];  Service: Orthopedics;  Laterality: Left;  Block  . THROMBECTOMY AND REVISION OF ARTERIOVENTOUS (AV) GORETEX  GRAFT Left 05/08/2007   forearm  . TOTAL HIP ARTHROPLASTY  12/18/2012   Procedure: TOTAL HIP ARTHROPLASTY;  Surgeon: Toribio JULIANNA Chancy, MD;  [facility];  Service: Orthopedics;  Laterality: Left;  SABRA VESICOVAGINAL FISTULA CLOSURE W/ TAH  1984    Family History  Problem Relation Age of Onset  . Prostate cancer Brother   . Lung cancer Brother   . Cancer Neg Hx     Social History:  reports that she quit smoking about 40 years ago. Her smoking use included cigarettes. She started smoking about 48 years ago. She has a 8 pack-year smoking history. She has never used smokeless tobacco. She reports that she does not drink alcohol and does not use drugs.  Allergies:  Allergies  Allergen Reactions  . Sodium Hypochlorite Shortness Of Breath  . Lactose Nausea And Vomiting  . Asa Arthritis Strength-Antacid [Aspirin  Buffered] Nausea And Vomiting and Other (See Comments)    STOMACH BURNS, also  . Aspirin  Nausea And Vomiting    Per pt. can tolerate the enteric coated tablets.   . Atorvastatin Other (See Comments)    Patient's skin was skin was sensitive ALL NSAIDS  . Banana Nausea And Vomiting  . Lactose Intolerance (Gi) Nausea And Vomiting  . Lisinopril Cough    Medications: I have reviewed the patient's current medications.  Results for orders placed or performed during the hospital encounter of 05/24/24 (from the  past 48 hours)  Glucose, capillary     Status: Abnormal   Collection Time: 06/04/24  4:45 PM  Result Value Ref Range   Glucose-Capillary 203 (H) 70 - 99 mg/dL    Comment: Glucose reference range applies only to samples taken after fasting for at least 8 hours.  Glucose, capillary     Status: None   Collection Time: 06/04/24  9:04 PM  Result Value Ref Range   Glucose-Capillary 98 70 - 99 mg/dL    Comment: Glucose reference range applies only to samples taken after fasting for at least 8 hours.   Comment 1 Notify RN    Comment 2 Document in Chart   CBC     Status: Abnormal   Collection Time: 06/05/24  3:20 AM  Result Value Ref Range   WBC 16.5 (H) 4.0 - 10.5 K/uL   RBC 3.12 (L) 3.87 - 5.11 MIL/uL   Hemoglobin 7.9 (L) 12.0 - 15.0 g/dL    Comment: Reticulocyte Hemoglobin testing may be clinically indicated, consider ordering this additional test [id number]    HCT 23.3 (L) 36.0 - 46.0 %  MCV 74.7 (L) 80.0 - 100.0 fL   MCH 25.3 (L) 26.0 - 34.0 pg   MCHC 33.9 30.0 - 36.0 g/dL   RDW 79.9 (H) 88.4 - 84.4 %   Platelets 172 150 - 400 K/uL    Comment: REPEATED TO VERIFY   nRBC 0.4 (H) 0.0 - 0.2 %    Comment: Performed at [facility], [address] [place], [place] [address]  Renal function panel     Status: Abnormal   Collection Time: 06/05/24  3:20 AM  Result Value Ref Range   Sodium 136 135 - 145 mmol/L   Potassium 4.7 3.5 - 5.1 mmol/L   Chloride 103 98 - 111 mmol/L   CO2 24 22 - 32 mmol/L   Glucose, Bld 122 (H) 70 - 99 mg/dL    Comment: Glucose reference range applies only to samples taken after fasting for at least 8 hours.   BUN 23 8 - 23 mg/dL   Creatinine, Ser 8.48 (H) 0.44 - 1.00 mg/dL   Calcium  8.9 8.9 - 10.3 mg/dL   Phosphorus 3.9 2.5 - 4.6 mg/dL   Albumin 2.7 (L) 3.5 - 5.0 g/dL   GFR, Estimated 35 (L) >60 mL/min    Comment: (NOTE) Calculated using the CKD-EPI Creatinine Equation (2021)    Anion gap 9 5 - 15    Comment: Performed at [facility],  [address], [place], [place] [address]  Glucose, capillary     Status: Abnormal   Collection Time: 06/05/24  6:10 AM  Result Value Ref Range   Glucose-Capillary 122 (H) 70 - 99 mg/dL    Comment: Glucose reference range applies only to samples taken after fasting for at least 8 hours.   Comment 1 Notify RN    Comment 2 Document in Chart   Glucose, capillary     Status: Abnormal   Collection Time: 06/05/24 11:11 AM  Result Value Ref Range   Glucose-Capillary 174 (H) 70 - 99 mg/dL    Comment: Glucose reference range applies only to samples taken after fasting for at least 8 hours.  Glucose, capillary     Status: Abnormal   Collection Time: 06/05/24  4:33 PM  Result Value Ref Range   Glucose-Capillary 176 (H) 70 - 99 mg/dL    Comment: Glucose reference range applies only to samples taken after fasting for at least 8 hours.   Comment 1 Notify RN    Comment 2 Document in Chart   Glucose, capillary     Status: None   Collection Time: 06/05/24  9:05 PM  Result Value Ref Range   Glucose-Capillary 87 70 - 99 mg/dL    Comment: Glucose reference range applies only to samples taken after fasting for at least 8 hours.   Comment 1 Notify RN    Comment 2 Document in Chart   CBC with Differential/Platelet     Status: Abnormal   Collection Time: 06/06/24  2:44 AM  Result Value Ref Range   WBC 15.8 (H) 4.0 - 10.5 K/uL   RBC 2.36 (L) 3.87 - 5.11 MIL/uL   Hemoglobin 6.1 (LL) 12.0 - 15.0 g/dL    Comment: REPEATED TO VERIFY Reticulocyte Hemoglobin testing may be clinically indicated, consider ordering this additional test [id number] THIS CRITICAL RESULT HAS VERIFIED AND BEEN CALLED TO ROSE TURGOTT, RN BY ABEER ALTOM ON 07 18 2025 AT 0322, AND HAS BEEN READ BACK.  OK TO RELEASE BY RN CORRECTED ON 07/18 AT 0324: PREVIOUSLY REPORTED  AS 6.1 REPEATED TO VERIFY Reticulocyte Hemoglobin testing may be clinically indicated, consider ordering this additional test [id number] THIS CRITICAL RESULT HAS VERIFIED AND  BEEN CALLED TO ROSE TURGOTT, RN  BY ABEER ALTOM ON 07 18 2025 AT 0322, AND HAS BEEN READ BACK.     HCT 17.9 (L) 36.0 - 46.0 %   MCV 75.8 (L) 80.0 - 100.0 fL   MCH 25.8 (L) 26.0 - 34.0 pg   MCHC 34.1 30.0 - 36.0 g/dL   RDW 79.6 (H) 88.4 - 84.4 %   Platelets 164 150 - 400 K/uL   nRBC 0.6 (H) 0.0 - 0.2 %   Neutrophils Relative % 69 %   Neutro Abs 10.9 (H) 1.7 - 7.7 K/uL   Lymphocytes Relative 16 %   Lymphs Abs 2.4 0.7 - 4.0 K/uL   Monocytes Relative 11 %   Monocytes Absolute 1.7 (H) 0.1 - 1.0 K/uL   Eosinophils Relative 0 %   Eosinophils Absolute 0.1 0.0 - 0.5 K/uL   Basophils Relative 0 %   Basophils Absolute 0.0 0.0 - 0.1 K/uL   Immature Granulocytes 4 %   Abs Immature Granulocytes 0.62 (H) 0.00 - 0.07 K/uL    Comment: Performed at [facility], [address], [place], [place] [address]  Renal function panel     Status: Abnormal   Collection Time: 06/06/24  2:44 AM  Result Value Ref Range   Sodium 134 (L) 135 - 145 mmol/L   Potassium 4.6 3.5 - 5.1 mmol/L   Chloride 102 98 - 111 mmol/L   CO2 22 22 - 32 mmol/L   Glucose, Bld 128 (H) 70 - 99 mg/dL    Comment: Glucose reference range applies only to samples taken after fasting for at least 8 hours.   BUN 33 (H) 8 - 23 mg/dL   Creatinine, Ser 7.55 (H) 0.44 - 1.00 mg/dL   Calcium  8.5 (L) 8.9 - 10.3 mg/dL   Phosphorus 6.0 (H) 2.5 - 4.6 mg/dL   Albumin 2.3 (L) 3.5 - 5.0 g/dL   GFR, Estimated 19 (L) >60 mL/min    Comment: (NOTE) Calculated using the CKD-EPI Creatinine Equation (2021)    Anion gap 10 5 - 15    Comment: Performed at [facility], [address] [place], [place] [address]  Glucose, capillary     Status: Abnormal   Collection Time: 06/06/24  6:24 AM  Result Value Ref Range   Glucose-Capillary 113 (H) 70 - 99 mg/dL    Comment: Glucose reference range applies only to samples taken after fasting for at least 8 hours.   Comment 1 Notify RN    Comment 2 Document in Chart   Type and screen [facility]     Status: None (Preliminary result)   Collection Time: 06/06/24  6:45 AM  Result Value Ref Range   ABO/RH(D) O POS    Antibody Screen NEG    Sample Expiration 06/09/2024,2359    Unit Number [id number]    Blood Component Type RED CELLS,LR    Unit division 00    Status of Unit ISSUED    Transfusion Status OK TO TRANSFUSE    Crossmatch Result      Compatible Performed at [facility], [address], [place], [place] [address]   Prepare RBC (crossmatch)     Status: None   Collection Time: 06/06/24  7:03 AM  Result Value Ref Range   Order Confirmation  ORDER PROCESSED BY BLOOD BANK Performed at [facility], [address], [place], [place] [address]   Reticulocytes     Status: Abnormal   Collection Time: 06/06/24  7:18 AM  Result Value Ref Range   Retic Ct Pct 4.2 (H) 0.4 - 3.1 %   RBC. 2.48 (L) 3.87 - 5.11 MIL/uL   Retic Count, Absolute 104.7 19.0 - 186.0 K/uL   Immature Retic Fract 40.1 (H) 2.3 - 15.9 %    Comment: Performed at [facility], [address] [place], [place] [address]  Glucose, capillary     Status: Abnormal   Collection Time: 06/06/24  7:40 AM  Result Value Ref Range   Glucose-Capillary 129 (H) 70 - 99 mg/dL    Comment: Glucose reference range applies only to samples taken after fasting for at least 8 hours.  Hepatic function panel     Status: Abnormal   Collection Time: 06/06/24  8:30 AM  Result Value Ref Range   Total Protein 5.0 (L) 6.5 - 8.1 g/dL   Albumin 2.6 (L) 3.5 - 5.0 g/dL   AST 30 15 - 41 U/L   ALT 17 0 - 44 U/L   Alkaline Phosphatase 35 (L) 38 - 126 U/L   Total Bilirubin 0.9 0.0 - 1.2 mg/dL   Bilirubin, Direct 0.1 0.0 - 0.2 mg/dL   Indirect Bilirubin 0.8 0.3 - 0.9 mg/dL    Comment: Performed at [facility], [address], [place], [place] [address]    CT FEMUR LEFT W CONTRAST Result Date: 06/04/2024 CLINICAL DATA:  Soft tissue mass left anterior thigh for 2 days. EXAM: CT OF THE LOWER  LEFT EXTREMITY WITH CONTRAST TECHNIQUE: Multidetector CT imaging of the lower left extremity was performed according to the standard protocol following intravenous contrast administration. RADIATION DOSE REDUCTION: This exam was performed according to the departmental dose-optimization program which includes automated exposure control, adjustment of the mA and/or kV according to patient size and/or use of iterative reconstruction technique. CONTRAST:  75mL OMNIPAQUE  IOHEXOL  350 MG/ML SOLN COMPARISON:  None Available. FINDINGS: The patient's anterior thigh mass corresponds to two large intramuscular hematomas centered in the quadriceps compartment. The larger more superior hematoma begins at the level of the left femoral neck and extends down into the mid thigh measuring approximately 20 x 4.8 x 3.2 cm. The second hematoma is located more deeply and inferiorly and measures approximately 10 x 2.3 x 1.8 cm. Both of these demonstrate areas of fluid fluid levels consistent with settling blood products. 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 No complicating features are identified. The left femur is intact. No significant knee joint pathology. The quadriceps and patellar tendons are intact. Evidence of prior patellar fracture fixation. No knee joint effusion. Advanced vascular disease noted. IMPRESSION: 1. The patient's anterior thigh mass corresponds to two large intramuscular hematomas centered in the quadriceps compartment. 2. The larger more superior hematoma begins at the level of the left femoral neck and extends down into the mid thigh measures approximately 20 x 4.8 x 3.2 cm. 3. The second hematoma is located more deeply and inferiorly and measures approximately 10 x 2.3 x 1.8 cm. 4. 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 No complicating features are identified. 5. Advanced vascular disease. Electronically Signed   By: MYRTIS Stammer M.D.   On: 06/04/2024 21:53    Review of Systems  HENT:  Negative for  ear discharge, ear pain, hearing loss and tinnitus.  Eyes:  Negative for photophobia and pain.  Respiratory:  Negative for cough and shortness of breath.   Cardiovascular:  Negative for chest pain.  Gastrointestinal:  Negative for abdominal pain, nausea and vomiting.  Genitourinary:  Negative for dysuria, flank pain, frequency and urgency.  Musculoskeletal:  Positive for arthralgias (Left thigh). Negative for back pain, myalgias and neck pain.  Neurological:  Negative for dizziness and headaches.  Hematological:  Does not bruise/bleed easily.  Psychiatric/Behavioral:  The patient is not nervous/anxious.    Blood pressure (!) 121/53, pulse 81, temperature 98.3 F (36.8 C), temperature source Oral, resp. rate 20, height 5' 2 (1.575 m), weight 49.9 kg, last menstrual period 03/04/2022, SpO2 91%. Physical Exam Constitutional:      General: She is not in acute distress.    Appearance: She is well-developed. She is not diaphoretic.  HENT:     Head: Normocephalic and atraumatic.  Eyes:     General: No scleral icterus.       Right eye: No discharge.        Left eye: No discharge.     Conjunctiva/sclera: Conjunctivae normal.  Cardiovascular:     Rate and Rhythm: Normal rate and regular rhythm.  Pulmonary:     Effort: Pulmonary effort is normal. No respiratory distress.  Musculoskeletal:     Cervical back: Normal range of motion.     Comments: LLE No traumatic wounds, ecchymosis, or rash  Mod thigh TTP, ant mass but skin does not appear threatened, compartments soft  No knee or ankle effusion  Knee stable to varus/ valgus and anterior/posterior stress  Sens DPN, SPN, TN intact  Motor EHL, ext, flex, evers 5/5  DP 1+, PT 1+, No significant edema  Skin:    General: Skin is warm and dry.  Neurological:     Mental Status: She is alert.  Psychiatric:        Mood and Affect: Mood normal.        Behavior: Behavior normal.     Assessment/Plan: Left thigh hematoma -- Will apply  compression to try and get this to tamponade. Have consulted IR for possible embolization if they can find any source of bleeding. Unfortunately CT angio is contraindicated with her creatinine rise this morning. No surgical indication as best practice is to let this tamponade.    Ozell DOROTHA Ned, PA-C Orthopedic Surgery [phone] 06/06/2024, 10:42 AM

## 2024-06-06 NOTE — Consult Note (Signed)
 Chief Complaint: Patient was seen in consultation today for  Chief Complaint  Patient presents with   Shortness of Breath         Referring Physician(s): Dr. Rosan   Supervising Physician: Johann Sieving  Patient Status: [facility] - In-pt  History of Present Illness: [name] is a 83 y.o. 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 She presented to the ED on 7/5 for worsening shortness of breath, diarrhea for 3 days, and was found to have COVID-pneumonia, requiring transfer to the ICU on 7/5-7/13.   On 06/03/24 she began to have left leg pain that was described as feeling achy. In the early morning hours of 7/15 the patient's left leg was very painful with obvious swelling. CT imaging showed two large intramuscular hematomas.   CT Femur 06/04/24 IMPRESSION: 1. The patient's anterior thigh mass corresponds to two large intramuscular hematomas centered in the quadriceps compartment. 2. The larger more superior hematoma begins at the level of the left femoral neck and extends down into the mid thigh measures approximately 20 x 4.8 x 3.2 cm. 3. The second hematoma is located more deeply and inferiorly and measures approximately 10 x 2.3 x 1.8 cm. 4. 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 No complicating features are identified. 5. Advanced vascular disease.  This morning the patient's hemoglobin dropped to 6.1. PRBCs have been ordered. Interventional Radiology has been asked to evaluate this patient for an image-guided left lower extremity arteriogram with possible embolization. Imaging reviewed and procedure approved by Dr. Johann.   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, hold ASA 2-3 weeks - consider plavix instead of ASA   Asthma    Cellulitis  12/19/2019   right upper arm   Coronary artery disease    cath 11/2007, grafts patent /  Nuclear, June, 2011, prior inferior MI with mild peri-infarct ischemia, anterior breast attenuation, EF 67%, done for renal transplant assessment / Low level exercise/Lexiscan  Myoview  (11/2013): EF 67%, no ischemi; normal study   Diabetes mellitus    type 2   ESRD on dialysis [facility])    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, 2012   Family history of breast cancer    Family history of prostate cancer    GERD (gastroesophageal reflux disease)    GI bleed 11/26/2010   January, 2012 , AVM   Gout    History of methicillin resistant staphylococcus aureus (MRSA)    Hyperlipidemia    Hyperparathyroidism    Hypertension    Myocardial infarction [facility])    Osteoporosis    Pneumonia 08/2010    Hospitalization, October, 2011   Primary osteoarthritis, 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   S/P kidney transplant    September, 2012, [facility]   Subacromial impingement of 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    Past Surgical History:  Procedure Laterality Date   ABDOMINAL HYSTERECTOMY  1980'S   ARTERIOVENOUS GRAFT PLACEMENT Left 03/04/2007   forearm   BIOPSY  09/30/2019   Procedure: BIOPSY;  Surgeon: Dianna Specking, MD;  Location: [facility];  Service: Endoscopy;;   BIOPSY  12/27/2021   Procedure: BIOPSY;  Surgeon: Dianna Specking, MD;  Location: [facility];  Service: Endoscopy;;   BREAST EXCISIONAL BIOPSY Right    benign more than 10 yr ago   BREAST SURGERY  YRS AGO  RT BREAST CYST REMOVED    CARDIAC CATHETERIZATION  06/03/2002; 12/04/2007   COLONOSCOPY WITH PROPOFOL  N/A 07/14/2014   Procedure: COLONOSCOPY WITH PROPOFOL ;  Surgeon: Jerrell KYM Sol, MD;  Location: [facility];  Service: Endoscopy;  Laterality: N/A;   COLONOSCOPY WITH PROPOFOL  N/A 09/30/2019   Procedure: COLONOSCOPY WITH PROPOFOL ;  Surgeon: Sol Jerrell, MD;  Location: [facility];  Service: Endoscopy;  Laterality: N/A;   COLONOSCOPY WITH PROPOFOL  N/A 12/27/2021    Procedure: COLONOSCOPY WITH PROPOFOL ;  Surgeon: Sol Jerrell, MD;  Location: [facility];  Service: Endoscopy;  Laterality: N/A;   CORONARY ARTERY BYPASS GRAFT  06/06/2002   x 4   ESOPHAGOGASTRODUODENOSCOPY N/A 12/27/2021   Procedure: ESOPHAGOGASTRODUODENOSCOPY (EGD);  Surgeon: Sol Jerrell, MD;  Location: [facility];  Service: Endoscopy;  Laterality: N/A;   HEMOSTASIS CLIP PLACEMENT  12/27/2021   Procedure: HEMOSTASIS CLIP PLACEMENT;  Surgeon: Sol Jerrell, MD;  Location: [facility];  Service: Endoscopy;;   HOT HEMOSTASIS N/A 07/14/2014   Procedure: HOT HEMOSTASIS (ARGON PLASMA COAGULATION/BICAP);  Surgeon: Jerrell KYM Sol, MD;  Location: [facility];  Service: Endoscopy;  Laterality: N/A;   IR FLUORO GUIDE CV LINE RIGHT  12/22/2019   IR US  GUIDE VASC ACCESS RIGHT  12/22/2019   KIDNEY TRANSPLANT Right 08/04/2011   ORIF PATELLA Left 04/06/2016   Procedure: OPEN REDUCTION INTERNAL (ORIF) LEFT  PATELLA;  Surgeon: Toribio JULIANNA Chancy, MD;  Location: [facility];  Service: Orthopedics;  Laterality: Left;   POLYPECTOMY  09/30/2019   Procedure: POLYPECTOMY;  Surgeon: Sol Jerrell, MD;  Location: [facility];  Service: Endoscopy;;   POLYPECTOMY  12/27/2021   Procedure: POLYPECTOMY;  Surgeon: Sol Jerrell, MD;  Location: [facility];  Service: Endoscopy;;   SHOULDER ARTHROSCOPY WITH ROTATOR CUFF REPAIR AND SUBACROMIAL DECOMPRESSION Left 08/03/2016   Procedure: LEFT SHOULDER ARTHROSCOPY WITH ROTATOR CUFF REPAIR AND SUBACROMIAL DECOMPRESSION with distal claviculectomy and extentsive debridement;  Surgeon: Toribio JULIANNA Chancy, MD;  Location:  SURGERY CENTER;  Service: Orthopedics;  Laterality: Left;  Block   THROMBECTOMY AND REVISION OF ARTERIOVENTOUS (AV) GORETEX  GRAFT Left 05/08/2007   forearm   TOTAL HIP ARTHROPLASTY  12/18/2012   Procedure: TOTAL HIP ARTHROPLASTY;  Surgeon: Toribio JULIANNA Chancy, MD;  Location[facility] OR;  Service: Orthopedics;  Laterality: Left;    VESICOVAGINAL FISTULA CLOSURE W/ TAH  1984    Allergies: Sodium hypochlorite, Lactose, Asa arthritis strength-antacid [aspirin  buffered], Aspirin , Atorvastatin, Banana, Lactose intolerance (gi), and Lisinopril  Medications: Prior to Admission medications   Medication Sig Start Date End Date Taking? Authorizing Provider  acetaminophen  (TYLENOL ) 650 MG CR tablet Take 650 mg by mouth daily as needed for pain.   Yes [provider]  albuterol  (PROVENTIL ) (2.5 MG/3ML) 0.083% nebulizer solution Take 2.5 mg by nebulization every 6 (six) hours as needed for wheezing or shortness of breath.   Yes [provider]  albuterol  (VENTOLIN  HFA) 108 (90 Base) MCG/ACT inhaler Inhale 2 puffs into the lungs every 6 (six) hours as needed for wheezing or shortness of breath.   Yes [provider]  amLODipine  (NORVASC ) 10 MG tablet Take 10 mg by mouth daily.    Yes [provider]  aspirin  EC 81 MG tablet Take 81 mg by mouth daily.   Yes [provider]  budesonide  (PULMICORT ) 0.25 MG/2ML nebulizer solution Inhale 0.25 mg into the lungs in the morning, at noon, in the evening, and at bedtime. 02/11/20  Yes [provider]  calcitRIOL  (ROCALTROL ) 0.25 MCG capsule Take 0.25 mcg by  mouth daily.    Yes [provider]  ezetimibe  (ZETIA ) 10 MG tablet TAKE 1 TABLET BY MOUTH EVERY DAY 01/16/24  Yes Nahser, Aleene PARAS, MD  fluticasone  (FLONASE ) 50 MCG/ACT nasal spray Place 1 spray into both nostrils in the morning, at noon, and at bedtime. 05/30/21  Yes [provider]  Fluticasone -Umeclidin-Vilant (TRELEGY ELLIPTA) 200-62.5-25 MCG/ACT AEPB Inhale 1 puff into the lungs in the morning, at noon, and at bedtime.   Yes [provider]  isosorbide  mononitrate (IMDUR ) 30 MG 24 hr tablet Take 1 tablet (30 mg total) by mouth daily. 04/15/24  Yes Nahser, Aleene PARAS, MD  KLOR-CON  M20 20 MEQ tablet TAKE 1 TABLET BY MOUTH EVERY DAY 11/22/23  Yes Nahser, Aleene PARAS, MD   metoprolol  tartrate (LOPRESSOR ) 50 MG tablet TAKE 1 TABLET BY MOUTH TWICE A DAY 07/24/23  Yes Nahser, Aleene PARAS, MD  nitroGLYCERIN  (NITROSTAT ) 0.4 MG SL tablet Place 1 tablet (0.4 mg total) under the tongue every 5 (five) minutes as needed for chest pain. 10/03/22  Yes Nahser, Aleene PARAS, MD  omeprazole (PRILOSEC) 20 MG capsule Take 20 mg by mouth daily after breakfast.   Yes [provider]  predniSONE  (DELTASONE ) 5 MG tablet Take 5 mg by mouth daily with breakfast.   Yes [provider]  tacrolimus  (PROGRAF ) 1 MG capsule Take 4 mg by mouth See admin instructions. Take 4 mg by mouth at 9 AM and 4 mg at 9 PM   Yes [provider]  Tiotropium Bromide  Monohydrate 1.25 MCG/ACT AERS Inhale 1 puff into the lungs in the morning and at bedtime. 10/23/17  Yes [provider]  torsemide  (DEMADEX ) 20 MG tablet Take 1 tablet (20 mg total) by mouth daily. 04/23/24 07/22/24 Yes Dahal, Chapman, MD  ONETOUCH VERIO test strip 1 EACH BY OTHER ROUTE DAILY IN THE AFTERNOON. USE AS INSTRUCTED 07/02/23   Shamleffer, Donell Cardinal, MD     Family History  Problem Relation Age of Onset   Prostate cancer Brother    Lung cancer Brother    Cancer Neg Hx     Social History   Socioeconomic History   Marital status: Divorced    Spouse name: Not on file   Number of children: Not on file   Years of education: Not on file   Highest education level: Not on file  Occupational History   Not on file  Tobacco Use   Smoking status: Former    Current packs/day: 0.00    Average packs/day: 1 pack/day for 8.0 years (8.0 ttl pk-yrs)    Types: Cigarettes    Start date: 11/21/1975    Quit date: 11/21/1983    Years since quitting: 40.5   Smokeless tobacco: Never  Vaping Use   Vaping status: Never Used  Substance and Sexual Activity   Alcohol use: No   Drug use: No   Sexual activity: Not on file  Other Topics Concern   Not on file  Social History Narrative   Not on file   Social Drivers of  Health   Financial Resource Strain: Low Risk  (04/02/2024)   Received from [facility]   Overall Financial Resource Strain (CARDIA)    Difficulty of Paying Living Expenses: Not hard at all  Food Insecurity: No Food Insecurity (05/25/2024)   Hunger Vital Sign    Worried About Running Out of Food in the Last Year: Never true    Ran Out of Food in the Last Year: Never true  Transportation Needs:  No Transportation Needs (05/25/2024)   PRAPARE - Administrator, Civil Service (Medical): No    Lack of Transportation (Non-Medical): No  Physical Activity: Sufficiently Active (08/09/2022)   [facility] from [facility]   Exercise Vital Sign    On average, how many days per week do you engage in moderate to strenuous exercise (like a brisk walk)?: 2 days    On average, how many minutes do you engage in exercise at this level?: 120 min  Stress: No Stress Concern Present (08/09/2022)   Received from [facility] - Occupational Stress Questionnaire    Feeling of Stress : Only a little  Social Connections: Moderately Integrated (05/25/2024)   Social Connection and Isolation Panel    Frequency of Communication with Friends and Family: More than three times a week    Frequency of Social Gatherings with Friends and Family: More than three times a week    Attends Religious Services: More than 4 times per year    Active Member of [employer] or Organizations: Yes    Attends Banker Meetings: More than 4 times per year    Marital Status: Widowed    Review of Systems: A 12 point ROS discussed and pertinent positives are indicated in the HPI above.  All other systems are negative.  Review of Systems  Respiratory:  Positive for shortness of breath.   Gastrointestinal:  Positive for diarrhea.  Musculoskeletal:        Left thigh pain/swelling.   All other systems reviewed and are negative.   Vital Signs: BP (!) 121/53   Pulse 81   Temp 98.3 F  (36.8 C) (Oral)   Resp 20   Ht 5' 2 (1.575 m)   Wt 110 lb 0.2 oz (49.9 kg)   LMP 03/04/2022   SpO2 91%   BMI 20.12 kg/m   Physical Exam Constitutional:      General: She is not in acute distress.    Appearance: She is underweight. She is not ill-appearing.  HENT:     Mouth/Throat:     Mouth: Mucous membranes are moist.     Pharynx: Oropharynx is clear.  Cardiovascular:     Rate and Rhythm: Normal rate.  Pulmonary:     Effort: Pulmonary effort is normal.     Comments: 6 L O2 via nasal cannula  Abdominal:     Tenderness: There is no abdominal tenderness.  Musculoskeletal:        General: Swelling present.     Comments: Left thigh   Skin:    General: Skin is warm and dry.  Neurological:     Mental Status: She is alert and oriented to person, place, and time.     Imaging:   Labs:  CBC: Recent Labs    06/03/24 0343 06/04/24 0342 06/05/24 0320 06/06/24 0244  WBC 9.1 13.4* 16.5* 15.8*  HGB 9.8* 9.3* 7.9* 6.1*  HCT 28.7* 27.1* 23.3* 17.9*  PLT 154 164 172 164    COAGS: No results for input(s): INR, APTT in the last 8760 hours.  BMP: Recent Labs    06/03/24 0343 06/04/24 0342 06/05/24 0320 06/06/24 0244  NA 138 137 136 134*  K 4.4 3.8 4.7 4.6  CL 108 102 103 102  CO2 21* 24 24 22   GLUCOSE 87 109* 122* 128*  BUN 30* 23 23 33*  CALCIUM  8.9 8.9 8.9 8.5*  CREATININE 1.51* 1.32* 1.51* 2.44*  GFRNONAA 35* 41*  35* 19*    LIVER FUNCTION TESTS: Recent Labs    04/09/24 1038 04/18/24 0244 05/24/24 1800 05/25/24 0550 06/04/24 0342 06/05/24 0320 06/06/24 0244 06/06/24 0830  BILITOT 0.6 0.6 0.7  --   --   --   --  0.9  AST 17 19 31   --   --   --   --  30  ALT 20 19 13   --   --   --   --  17  ALKPHOS 47 51 47  --   --   --   --  35*  PROT 5.8* 5.4* 6.0*  --   --   --   --  5.0*  ALBUMIN 3.3* 3.0* 2.6*  2.6*   < > 2.7* 2.7* 2.3* 2.6*   < > = values in this interval not displayed.    TUMOR MARKERS: No results for input(s): AFPTM, CEA,  CA199, CHROMGRNA in the last 8760 hours.  Assessment and Plan:  Left lower extremity hematoma: [name], 83 year old female, is scheduled today for a lower extremity arteriogram with possible embolization. She has not been NPO. The procedure will be performed with one sedating agent. She remains on airborne precautions for COVID. The procedure was discussed with the patient and her daughter at the bedside.   Risks and benefits of this procedure were discussed with the patient including, but not limited to bleeding, infection, vascular injury or contrast induced renal failure.  This interventional procedure involves the use of X-rays and because of the nature of the planned procedure, it is possible that we will have prolonged use of X-ray fluoroscopy.  Potential radiation risks to you include (but are not limited to) the following: - A slightly elevated risk for cancer  several years later in life. This risk is typically less than 0.5% percent. This risk is low in comparison to the normal incidence of human cancer, which is 33% for women and 50% for men according to the American Cancer Society. - Radiation induced injury can include skin redness, resembling a rash, tissue breakdown / ulcers and hair loss (which can be temporary or permanent).   The likelihood of either of these occurring depends on the difficulty of the procedure and whether you are sensitive to radiation due to previous procedures, disease, or genetic conditions.   IF your procedure requires a prolonged use of radiation, you will be notified and given written instructions for further action.  It is your responsibility to monitor the irradiated area for the 2 weeks following the procedure and to notify your physician if you are concerned that you have suffered a radiation induced injury.    All of the patient's questions were answered, patient is agreeable to proceed.   Consent signed and in IR.   Thank you for  this interesting consult.  I greatly enjoyed meeting [name] and look forward to participating in their care.  A copy of this report was sent to the requesting provider on this date.  Electronically Signed: Warren Dais, AGACNP-BC 06/06/2024, 10:50 AM   I spent a total of 20 Minutes    in face to face in clinical consultation, greater than 50% of which was counseling/coordinating care for left thigh hematoma.

## 2024-06-06 NOTE — Progress Notes (Signed)
Patient back from procedure.

## 2024-06-06 NOTE — Progress Notes (Signed)
 PT Cancellation Note  Patient Details Name: [name] MRN: [id number] DOB: Feb 21, 1941   Cancelled Treatment:    Reason Eval/Treat Not Completed: (P) Patient at procedure or test/unavailable (off unit for lower extremity arteriogram with possible embolization at IR.) Of note, pt also pending 1 unit PRBC due to low Hgb. Will continue efforts later in the day per PT plan of care as schedule permits once medically appropriate.   Connell HERO Deatrice Spanbauer 06/06/2024, 12:19 PM

## 2024-06-06 NOTE — Progress Notes (Signed)
 Orthopedic Tech Progress Note Patient Details:  [name] September 01, 1941 [id number]  Patient ID: [name], female   DOB: 1941/04/10, 83 y.o.   MRN: [id number] Completed by nurse Adine MARLA Blush 06/06/2024, 4:50 PM

## 2024-06-06 NOTE — Progress Notes (Signed)
 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  Overnight Events: Pt continued to complain about LLE pain  Pt seen and examined at the bedside this morning. She reports that her leg is in significant pain and feels very heavy to move. She is worried that people will think she is being dramatic, but her leg is really bothering her. She became teary during our conversation. Discussed the need for blood transfusion and our plan moving forward. All questions and concerns were addressed at this time.     Objective:  Vital signs in last 24 hours: Vitals:   06/06/24 1245 06/06/24 1250 06/06/24 1300 06/06/24 1319  BP: (!) 110/52 (!) 113/54 (!) 105/54 (!) 122/55  Pulse: 71 71 71 70  Resp: 15 16 16 17   Temp:      TempSrc:      SpO2: 100% 100% 100% 100%  Weight:      Height:       Supplemental O2: HFNC SpO2: 100 % O2 Flow Rate (L/min): 10 L/min FiO2 (%): 65 %   Physical Exam:   Const: Awake, alert in NAD,  HENT: Normocephalic, atraumatic, mucus membranes moist with [place] in place Card: RRR, No pitting edema on LE's bilaterally  Resp: Decreased at the bases. No wheezes heard Abd: Soft, NTND Extremities: Warm, left thigh ttp with obvious edema from inguinal fold to knee. Warm to touch without signs of breakage in skin. Similar in circumference to yesterday's exam   Filed Weights   06/05/24 0339 06/05/24 2325 06/06/24 0500  Weight: 50.5 kg 56 kg 49.9 kg     Intake/Output Summary (Last 24 hours) at 06/06/2024 1335 Last data filed at 06/05/2024 2223 Gross per 24 hour  Intake 483 ml  Output --  Net 483 ml    Net IO Since Admission: 8,507.27 mL [06/06/24 1335]  Recent Labs    06/05/24 2105 06/06/24 0624 06/06/24 0740  GLUCAP 87 113* 129*     Pertinent Labs:    Latest Ref Rng & Units 06/06/2024    2:44 AM 06/05/2024    3:20 AM 06/04/2024    3:42 AM  CBC  WBC 4.0 - 10.5 K/uL 15.8  16.5  13.4   Hemoglobin 12.0 - 15.0 g/dL 6.1  C 7.9  9.3   Hematocrit 36.0 - 46.0 % 17.9  23.3  27.1   Platelets 150 - 400 K/uL 164  172  164     C Corrected result       Latest Ref Rng & Units 06/06/2024    8:30 AM 06/06/2024    2:44 AM 06/05/2024    3:20 AM  CMP  Glucose 70 - 99 mg/dL  871  877   BUN 8 - 23 mg/dL  33  23   Creatinine 9.55 - 1.00 mg/dL  7.55  8.48   Sodium 864 - 145 mmol/L  134  136   Potassium 3.5 - 5.1 mmol/L  4.6  4.7   Chloride 98 - 111 mmol/L  102  103   CO2 22 - 32 mmol/L  22  24   Calcium  8.9 - 10.3 mg/dL  8.5  8.9   Total Protein 6.5 - 8.1 g/dL 5.0     Total Bilirubin 0.0 - 1.2 mg/dL 0.9     Alkaline Phos 38 - 126 U/L 35     AST 15 - 41 U/L 30     ALT 0 - 44 U/L 17       Imaging: No results found.   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   #Acute on Chronic hypoxic respiratory failure( 4-6 L supplemental oxygen ) # COVID-pneumonia. #  Bronchopneumonia # Asthma-COPD overlap syndrome -s/p 7 day course Zosyn  for PNA coverage, completed decadron  on 07/13. -Will continue baricitinib  for 14 day course through 7/20 -LE U/S without evidence of deep vein thrombosis 7/14 - Continue with Brovana /Yupelri /Pulmicort naomia needed DuoNebs. -Patient continues to improve significantly. Weaned down to 10L [place] today. Home requirements are 4L resting and 6L when active. Will continue to wean slowly to maintain 88-92%  #Left leg pain #Expanding hematoma #Symptomatic Anemia -Pt continues to have left leg pain. He left thigh is larger in circumference today and very warm to touch. She reports that it is very heavy to move and uncomfortable. -CT 7/16 showed two large hematomas extending from the L femoral neck to just above the knee.  -0.5mg  IV dilaudid  q6 PRN ordered for pain management.  -Hgb 6.1 this morning and symptomatic. Pt received 1unit PRBC's. Will check post transfusion CBC and transfuse if needed. -Orthopedics consulted this morning for assistance with management. LLE angiogram performed with IR and unremarkable without extravasation. Will add compression to help tamponade.   #Leukocytosis WBC 15.8 this morning. Low suspicion for infection due to pt remaining afebrile with respiratory symptoms improving and recent completion of 7day course of zosyn . CT L femur was not concerning for soft tissue infection. Pt also denies urinary symptoms. Will continue to monitor closely and obtain CBC with dif.   #AKI on CKD 3A #S/p kidney transplant 2012. S/p renal transplant in 2012. AKI on admission likely ischemic ATN, in the setting of sepsis and hypotension.   - Strict ins and outs - CW tacrolimus  4 mg twice daily. - resumed home prednisone  5 mg. - Daily RFP -Scr 2.44 today. Potentially contrast induced ATN given poor renal function at baseline. Will give fluids today and continue to monitor.  -Pt voided only once in 24hours. Will bladder scan.    # Hypertension -Continue with amlodipine  10 mg, and metoprolol  50 mg twice daily.  #Coronary artery disease. #History of diastolic CHF(EF 60 to 65%, G2 DD) - Continue aspirin  81 mg and Zetia  10 mg daily.  Type 2 diabetes A1c 6.8 on 7/6.  Blood glucose stable.  Discontinued semglee  due to hypoglycemia. - SSI. - CBG  #GERD - Famotidine  20 mg daily.  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  Diet: Renal diet IVF: PO intake VTE: SCDs Start: 05/24/24 2222 Code: Full PT/OT: Pending ID:  Anti-infectives (From admission, onward)    Start     Dose/Rate Route Frequency Ordered Stop   05/28/24 1000  piperacillin -tazobactam (ZOSYN ) IVPB 3.375 g        3.375 g 12.5 mL/hr over 240 Minutes Intravenous Every 8 hours 05/28/24 0848 05/30/24 2203   05/25/24 2200  piperacillin -tazobactam (ZOSYN ) IVPB 2.25 g  Status:  Discontinued        2.25 g 100 mL/hr over 30 Minutes Intravenous Every 8 hours 05/25/24 1436 05/28/24 0848   05/25/24 1000  remdesivir  100 mg in sodium chloride  0.9 % 100 mL IVPB  Status:  Discontinued       Placed in Followed by Linked Group   100 mg 200 mL/hr over 30 Minutes Intravenous Daily 05/24/24 2133 05/25/24 1254   05/24/24 2300  remdesivir  200 mg in sodium chloride  0.9% 250 mL IVPB       Placed in Followed by Linked Group   200 mg 580 mL/hr over 30 Minutes Intravenous Once 05/24/24 2133 05/25/24 0721   05/24/24 2200  linezolid  (ZYVOX ) IVPB 600 mg  Status:  Discontinued        600 mg 300 mL/hr over 60 Minutes Intravenous Every 12 hours 05/24/24 1722 05/26/24 1104   05/24/24 2100  piperacillin -tazobactam (ZOSYN ) IVPB 3.375 g  Status:  Discontinued        3.375 g 12.5 mL/hr over 240 Minutes Intravenous Every 8 hours 05/24/24 1728 05/25/24 1436   05/24/24 1500  cefTRIAXone  (ROCEPHIN ) 1 g in sodium chloride  0.9 % 100 mL IVPB        1 g 200 mL/hr over 30 Minutes Intravenous  Once 05/24/24  1450 05/24/24 1544   05/24/24 1500  doxycycline  (VIBRAMYCIN ) 100 mg in sodium chloride  0.9 % 250 mL IVPB  Status:  Discontinued        100 mg 125 mL/hr over 120 Minutes Intravenous  Once 05/24/24 1450 05/24/24 1726      Anticipated discharge to pending medical stabilization.  Myrna Bitters, DO 06/06/2024, 1:35 PM Pager: [phone] [facility]  Please contact the on call pager after 5 pm and on weekends at [phone].

## 2024-06-06 NOTE — Plan of Care (Signed)

## 2024-06-07 DIAGNOSIS — K921 Melena: Secondary | ICD-10-CM

## 2024-06-07 LAB — HEPATIC FUNCTION PANEL
ALT: 17 U/L (ref 0–44)
AST: 39 U/L (ref 15–41)
Albumin: 2.5 g/dL — ABNORMAL LOW (ref 3.5–5.0)
Alkaline Phosphatase: 43 U/L (ref 38–126)
Bilirubin, Direct: 0.1 mg/dL (ref 0.0–0.2)
Indirect Bilirubin: 0.8 mg/dL (ref 0.3–0.9)
Total Bilirubin: 0.9 mg/dL (ref 0.0–1.2)
Total Protein: 4.9 g/dL — ABNORMAL LOW (ref 6.5–8.1)

## 2024-06-07 LAB — GLUCOSE, CAPILLARY
Glucose-Capillary: 89 mg/dL (ref 70–99)
Glucose-Capillary: 163 mg/dL — ABNORMAL HIGH (ref 70–99)
Glucose-Capillary: 133 mg/dL — ABNORMAL HIGH (ref 70–99)
Glucose-Capillary: 161 mg/dL — ABNORMAL HIGH (ref 70–99)

## 2024-06-07 LAB — RENAL FUNCTION PANEL
Albumin: 2.2 g/dL — ABNORMAL LOW (ref 3.5–5.0)
Anion gap: 7 (ref 5–15)
BUN: 36 mg/dL — ABNORMAL HIGH (ref 8–23)
CO2: 23 mmol/L (ref 22–32)
Calcium: 8.1 mg/dL — ABNORMAL LOW (ref 8.9–10.3)
Chloride: 103 mmol/L (ref 98–111)
Creatinine, Ser: 2.04 mg/dL — ABNORMAL HIGH (ref 0.44–1.00)
GFR, Estimated: 24 mL/min — ABNORMAL LOW (ref >60)
Glucose, Bld: 85 mg/dL (ref 70–99)
Phosphorus: 5.1 mg/dL — ABNORMAL HIGH (ref 2.5–4.6)
Potassium: 4.7 mmol/L (ref 3.5–5.1)
Sodium: 133 mmol/L — ABNORMAL LOW (ref 135–145)

## 2024-06-07 LAB — CBC
HCT: 20 % — ABNORMAL LOW (ref 36.0–46.0)
Hemoglobin: 7.1 g/dL — ABNORMAL LOW (ref 12.0–15.0)
MCH: 27.5 pg (ref 26.0–34.0)
MCHC: 35.5 g/dL (ref 30.0–36.0)
MCV: 77.5 fL — ABNORMAL LOW (ref 80.0–100.0)
Platelets: 157 K/uL (ref 150–400)
RBC: 2.58 MIL/uL — ABNORMAL LOW (ref 3.87–5.11)
RDW: 19.2 % — ABNORMAL HIGH (ref 11.5–15.5)
WBC: 14.8 K/uL — ABNORMAL HIGH (ref 4.0–10.5)
nRBC: 0.2 % (ref 0.0–0.2)

## 2024-06-07 LAB — IRON AND TIBC
Iron: 69 ug/dL (ref 28–170)
Saturation Ratios: 27 % (ref 10.4–31.8)
TIBC: 252 ug/dL (ref 250–450)
UIBC: 183 ug/dL

## 2024-06-07 LAB — FERRITIN: Ferritin: 278 ng/mL (ref 11–307)

## 2024-06-07 MED ORDER — PANTOPRAZOLE SODIUM 40 MG IV SOLR
40.0000 mg | Freq: Two times a day (BID) | INTRAVENOUS | Status: DC
  Administered 2024-06-07 – 2024-06-08 (×4): 40 mg via INTRAVENOUS
  Filled 2024-06-07 (×4): qty 10

## 2024-06-07 MED ORDER — BARICITINIB 2 MG PO TABS
1.0000 mg | ORAL_TABLET | Freq: Every day | ORAL | Status: AC
  Administered 2024-06-08: 1 mg via ORAL
  Filled 2024-06-07 (×2): qty 0.5

## 2024-06-07 MED ORDER — LACTATED RINGERS IV BOLUS
500.0000 mL | Freq: Once | INTRAVENOUS | Status: AC
  Administered 2024-06-07: 500 mL via INTRAVENOUS

## 2024-06-07 NOTE — Progress Notes (Addendum)
 Fecal Occult Blood test completed at bedside Lot [id number] exp 08/27. Test is positive in both stool sample areas. Notified Dr. Schuyler Novak of results.

## 2024-06-07 NOTE — Progress Notes (Signed)
 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  Overnight Events: Pt continued to complain about LLE pain  Pt seen and examined at the bedside this morning. She seems to be feeling much better and states that her leg is still very heavy but in less pain. She is feeling good about her breathing and feels that it is getting closer to how she feels at home. She would like to get up and eat breakfast this morning.     Objective:  Vital signs in last 24 hours: Vitals:   06/07/24 0029 06/07/24 0429 06/07/24 0500 06/07/24 0822  BP: 116/70 (!) 125/59    Pulse: 65 70    Resp: 14 17    Temp: 98.5 F (36.9 C) 98.5 F (36.9 C)    TempSrc: Oral Oral    SpO2: 99% 95%  96%  Weight:   49.4 kg   Height:       Supplemental O2: HFNC SpO2: 96 % O2 Flow Rate (L/min): 8 L/min FiO2 (%): 65 %   Physical Exam:   Const: Awake, alert in NAD,  HENT: Normocephalic, atraumatic, mucus membranes moist with [place] in place Card: RRR, No pitting edema on LE's bilaterally  Resp: Decreased at the bases. No crackles or wheezes Abd: Soft, NTND Extremities: Warm, Left thigh wrapped in ace bandage. Pulses intact distally.    Filed Weights   06/05/24 2325 06/06/24 0500 06/07/24 0500  Weight: 56 kg 49.9 kg 49.4 kg     Intake/Output Summary (Last 24 hours) at 06/07/2024 0914 Last data filed at 06/07/2024 0000 Gross per 24 hour  Intake 1001.02 ml  Output --  Net 1001.02 ml   Net IO Since Admission: 9,508.29 mL [06/07/24 0914]  Recent Labs    06/06/24 1400 06/06/24 1614 06/07/24 0453  GLUCAP 124* 180* 89     Pertinent  Labs:    Latest Ref Rng & Units 06/07/2024    3:10 AM 06/06/2024    2:22 PM 06/06/2024    2:44 AM  CBC  WBC 4.0 - 10.5 K/uL 14.8  11.3  15.8   Hemoglobin 12.0 - 15.0 g/dL 7.1  7.9  6.1  C  Hematocrit 36.0 - 46.0 % 20.0  22.3  17.9   Platelets 150 - 400 K/uL 157  157  164     C Corrected result       Latest Ref Rng & Units 06/07/2024    3:10 AM 06/06/2024    8:30 AM 06/06/2024    2:44 AM  CMP  Glucose 70 - 99 mg/dL 85   871   BUN 8 - 23 mg/dL 36   33   Creatinine 9.55 - 1.00 mg/dL 7.95   7.55   Sodium 864 - 145 mmol/L 133   134   Potassium 3.5 - 5.1 mmol/L 4.7   4.6   Chloride 98 - 111 mmol/L 103   102   CO2 22 - 32 mmol/L 23   22   Calcium  8.9 - 10.3 mg/dL 8.1   8.5   Total Protein 6.5 - 8.1 g/dL  5.0    Total Bilirubin 0.0 - 1.2  mg/dL  0.9    Alkaline Phos 38 - 126 U/L  35    AST 15 - 41 U/L  30    ALT 0 - 44 U/L  17      Imaging: IR Angiogram Extremity Left Result Date: 06/06/2024 CLINICAL DATA:  Enlarging left thigh hematoma EXAM: LEFT EXTREMITY ARTERIOGRAPHY; IR ULTRASOUND GUIDANCE VASC ACCESS RIGHT ANESTHESIA/SEDATION: Intravenous Fentanyl  50mcg and Versed  1mg  were administered by RN during a total moderate (conscious) sedation time of 23 minutes; the patient's level of consciousness and physiological / cardiorespiratory status were monitored continuously by radiology RN under my direct supervision. MEDICATIONS: Lidocaine  1% subcutaneous CONTRAST:  40mL OMNIPAQUE  IOHEXOL  300 MG/ML SOLN, 10mL OMNIPAQUE  IOHEXOL  300 MG/ML SOLN PROCEDURE: The procedure, risks (including but not limited to bleeding, infection, organ damage ), benefits, and alternatives were explained to the patient. Questions regarding the procedure were encouraged and answered. The patient understands and consents to the procedure. Right femoral region prepped and draped in usual sterile fashion. Maximal barrier sterile technique was utilized including caps, mask, sterile gowns, sterile gloves, sterile drape, hand  hygiene and skin antiseptic. The right common femoral artery was localized under ultrasound. Under real-time ultrasound guidance, the vessel was accessed with a 21-gauge micropuncture needle, exchanged over a 018 guidewire for a transitional dilator, through which a 035 guidewire was advanced. Over this, a 5 [place] vascular sheath was placed, through which a 5 [place] C2 catheter was advanced and used to selectively catheterize the proximal left common femoral artery for selective left lower extremity arteriography in multiple projections. The catheter and sheath were removed and hemostasis achieved with the aid of the Celt device under ultrasound guidance. The patient tolerated the procedure well. COMPLICATIONS: None immediate FINDINGS: Common femoral widely patent. Extensive atheromatous plaque through the left SFA with tandem areas of moderate to severe short-segment stenosis. Deep femoral branches unremarkable. Proximal popliteal artery patent. No evidence of active extravasation, pseudoaneurysm, AVM, or other lesion to suggest a site or etiology of the patient's hematoma. IMPRESSION: 1. No acute arterial lesion or active extravasation. 2. Multiple tandem left SFA atheromatous stenoses of probable hemodynamic significance. Correlate with ABIs. Electronically Signed   By: JONETTA Faes M.D.   On: 06/06/2024 15:23   IR US  Guide Vasc Access Right Result Date: 06/06/2024 CLINICAL DATA:  Enlarging left thigh hematoma EXAM: LEFT EXTREMITY ARTERIOGRAPHY; IR ULTRASOUND GUIDANCE VASC ACCESS RIGHT ANESTHESIA/SEDATION: Intravenous Fentanyl  50mcg and Versed  1mg  were administered by RN during a total moderate (conscious) sedation time of 23 minutes; the patient's level of consciousness and physiological / cardiorespiratory status were monitored continuously by radiology RN under my direct supervision. MEDICATIONS: Lidocaine  1% subcutaneous CONTRAST:  40mL OMNIPAQUE  IOHEXOL  300 MG/ML SOLN, 10mL OMNIPAQUE  IOHEXOL  300 MG/ML SOLN  PROCEDURE: The procedure, risks (including but not limited to bleeding, infection, organ damage ), benefits, and alternatives were explained to the patient. Questions regarding the procedure were encouraged and answered. The patient understands and consents to the procedure. Right femoral region prepped and draped in usual sterile fashion. Maximal barrier sterile technique was utilized including caps, mask, sterile gowns, sterile gloves, sterile drape, hand hygiene and skin antiseptic. The right common femoral artery was localized under ultrasound. Under real-time ultrasound guidance, the vessel was accessed with a 21-gauge micropuncture needle, exchanged over a 018 guidewire for a transitional dilator, through which a 035 guidewire was advanced. Over this, a 5 [place] vascular sheath was placed, through which a 5 [place] C2 catheter was advanced and used to selectively catheterize the  proximal left common femoral artery for selective left lower extremity arteriography in multiple projections. The catheter and sheath were removed and hemostasis achieved with the aid of the Celt device under ultrasound guidance. The patient tolerated the procedure well. COMPLICATIONS: None immediate FINDINGS: Common femoral widely patent. Extensive atheromatous plaque through the left SFA with tandem areas of moderate to severe short-segment stenosis. Deep femoral branches unremarkable. Proximal popliteal artery patent. No evidence of active extravasation, pseudoaneurysm, AVM, or other lesion to suggest a site or etiology of the patient's hematoma. IMPRESSION: 1. No acute arterial lesion or active extravasation. 2. Multiple tandem left SFA atheromatous stenoses of probable hemodynamic significance. Correlate with ABIs. Electronically Signed   By: JONETTA Faes M.D.   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   #Acute on Chronic hypoxic respiratory failure( 4-6 L supplemental oxygen ) # COVID-pneumonia. # Bronchopneumonia # Asthma-COPD overlap syndrome -s/p 7 day course Zosyn  for PNA coverage, completed decadron  on 07/13. -Will continue baricitinib  for 14 day course through 7/20 -LE U/S without evidence of deep vein thrombosis 7/14 - Continue with Brovana /Yupelri /Pulmicort /as needed DuoNebs. -Patient continues to improve significantly. Weaned down to 8L [place] today while resting in bed. Home requirements are 4L resting and 6L when active. Will continue to wean slowly to maintain 88-92%  #Left leg pain #Expanding hematoma #Symptomatic Anemia -CT 7/16 showed two large hematomas extending from the L femoral neck to just above the knee.  -Orthopedics consulted 7/18. LLE angiogram performed with IR and unremarkable without extravasation. Continue compression with Ace Bandage.  -s/p 1unit PRBC 7/18. Hgb 7.9 post transfusion. 7.1 this AM--likely dilutional due pt receiving 2L LR yesterday evening.  -CTM wil daily CBC's -Heparin , ASA on hold.  #Leukocytosis -WBC 14.8 this morning. Improving from days past.  -Low suspicion for infection due to pt remaining afebrile with respiratory symptoms improving and recent completion of 7day course of zosyn . CT L femur was not concerning for  soft tissue infection. Pt also denies urinary symptoms.  -Will continue to monitor closely  #AKI on CKD 3A #S/p kidney transplant 2012. - S/p renal transplant in 2012.  - CW tacrolimus  4 mg twice daily. -Continue home prednisone  5 mg. - Daily RFP - Scr improving today after receiving 2L LR. Pt continues to report decreased urine output. Likely volume down from decreased oral intake. Bladder scans <261ml. Will give 500ml bolus today.   # Hypertension -Continue with amlodipine  10 mg, and metoprolol  50 mg twice daily.  #Coronary artery disease. #History of diastolic CHF(EF 60 to 65%, G2 DD) -Zetia  10 mg daily. -Asprin on hold due to expanding hematoma.   Type 2 diabetes A1c 6.8 on 7/6.  Blood glucose stable.  Discontinued semglee  due to hypoglycemia. - SSI. - CBG  #GERD - Famotidine  20 mg daily.  Chronic thrombocytopenia -Platelets stable at 160K  Disposition -Previous PT recommended HH. Will ask them to re-evaluate due to pt's debility and difficulty ambulating with LLE hematoma.   Incidental finding on imaging Pulmonary arterial hypertension  Diet: Renal diet IVF: PO intake VTE: SCDs Start: 05/24/24 2222 Code: Full PT/OT: Pending ID:  Anti-infectives (From admission, onward)    Start     Dose/Rate Route Frequency Ordered Stop   05/28/24 1000  piperacillin -tazobactam (ZOSYN ) IVPB 3.375 g        3.375 g 12.5 mL/hr over 240 Minutes Intravenous Every 8 hours 05/28/24 0848 05/30/24 2203   05/25/24 2200  piperacillin -tazobactam (ZOSYN ) IVPB 2.25 g  Status:  Discontinued        2.25 g 100 mL/hr over 30 Minutes Intravenous Every 8 hours 05/25/24 1436 05/28/24 0848   05/25/24 1000  remdesivir  100 mg in sodium chloride  0.9 % 100 mL IVPB  Status:  Discontinued       Placed in Followed by Linked Group   100 mg 200 mL/hr over 30 Minutes Intravenous Daily 05/24/24 2133 05/25/24 1254   05/24/24 2300  remdesivir  200 mg in sodium chloride  0.9% 250 mL IVPB       Placed in  Followed by Linked Group   200 mg 580 mL/hr over 30 Minutes Intravenous Once 05/24/24 2133 05/25/24 0721   05/24/24 2200  linezolid  (ZYVOX ) IVPB 600 mg  Status:  Discontinued        600 mg 300 mL/hr over 60 Minutes Intravenous Every 12 hours 05/24/24 1722 05/26/24 1104   05/24/24 2100  piperacillin -tazobactam (ZOSYN ) IVPB 3.375 g  Status:  Discontinued        3.375 g 12.5 mL/hr over 240 Minutes Intravenous Every 8 hours 05/24/24 1728 05/25/24 1436   05/24/24 1500  cefTRIAXone  (ROCEPHIN ) 1 g in sodium chloride  0.9 % 100 mL IVPB        1 g 200 mL/hr over 30 Minutes Intravenous  Once 05/24/24 1450 05/24/24 1544   05/24/24 1500  doxycycline  (VIBRAMYCIN ) 100 mg in sodium chloride  0.9 % 250 mL IVPB  Status:  Discontinued        100 mg 125 mL/hr over 120 Minutes Intravenous  Once 05/24/24 1450 05/24/24 1726      Anticipated discharge to pending medical stabilization.  Myrna Bitters, DO 06/07/2024, 9:14 AM Pager: [phone] [facility]  Please contact the on call pager after 5 pm and on weekends at [phone].

## 2024-06-07 NOTE — Plan of Care (Signed)
  Problem: Education: Goal: Knowledge of risk factors and measures for prevention of condition will improve Outcome: Progressing   Problem: Coping: Goal: Psychosocial and spiritual needs will be supported Outcome: Progressing   Problem: Respiratory: Goal: Will maintain a patent airway Outcome: Progressing Goal: Complications related to the disease process, condition or treatment will be avoided or minimized Outcome: Progressing   Problem: Education: Goal: Knowledge of General Education information will improve Description: Including pain rating scale, medication(s)/side effects and non-pharmacologic comfort measures Outcome: Progressing   Problem: Health Behavior/Discharge Planning: Goal: Ability to manage health-related needs will improve Outcome: Progressing   Problem: Clinical Measurements: Goal: Ability to maintain clinical measurements within normal limits will improve Outcome: Progressing Goal: Will remain free from infection Outcome: Progressing Goal: Diagnostic test results will improve Outcome: Progressing Goal: Respiratory complications will improve Outcome: Progressing Goal: Cardiovascular complication will be avoided Outcome: Progressing   Problem: Activity: Goal: Risk for activity intolerance will decrease Outcome: Progressing   Problem: Nutrition: Goal: Adequate nutrition will be maintained Outcome: Progressing   Problem: Coping: Goal: Level of anxiety will decrease Outcome: Progressing   Problem: Elimination: Goal: Will not experience complications related to bowel motility Outcome: Progressing Goal: Will not experience complications related to urinary retention Outcome: Progressing   Problem: Pain Managment: Goal: General experience of comfort will improve and/or be controlled Outcome: Progressing   Problem: Safety: Goal: Ability to remain free from injury will improve Outcome: Progressing   Problem: Skin Integrity: Goal: Risk for impaired  skin integrity will decrease Outcome: Progressing   Problem: Education: Goal: Ability to demonstrate management of disease process will improve Outcome: Progressing Goal: Ability to verbalize understanding of medication therapies will improve Outcome: Progressing Goal: Individualized Educational Video(s) Outcome: Progressing   Problem: Activity: Goal: Capacity to carry out activities will improve Outcome: Progressing   Problem: Cardiac: Goal: Ability to achieve and maintain adequate cardiopulmonary perfusion will improve Outcome: Progressing   Problem: Education: Goal: Ability to describe self-care measures that may prevent or decrease complications (Diabetes Survival Skills Education) will improve Outcome: Progressing Goal: Individualized Educational Video(s) Outcome: Progressing   Problem: Coping: Goal: Ability to adjust to condition or change in health will improve Outcome: Progressing   Problem: Fluid Volume: Goal: Ability to maintain a balanced intake and output will improve Outcome: Progressing   Problem: Health Behavior/Discharge Planning: Goal: Ability to identify and utilize available resources and services will improve Outcome: Progressing Goal: Ability to manage health-related needs will improve Outcome: Progressing   Problem: Metabolic: Goal: Ability to maintain appropriate glucose levels will improve Outcome: Progressing   Problem: Nutritional: Goal: Maintenance of adequate nutrition will improve Outcome: Progressing Goal: Progress toward achieving an optimal weight will improve Outcome: Progressing   Problem: Skin Integrity: Goal: Risk for impaired skin integrity will decrease Outcome: Progressing   Problem: Tissue Perfusion: Goal: Adequacy of tissue perfusion will improve Outcome: Progressing

## 2024-06-07 NOTE — Progress Notes (Signed)
 Physical Therapy Treatment Patient Details Name: [name] MRN: [id number] DOB: 07/27/1941 Today's Date: 06/07/2024   History of Present Illness Pt is an 83 y.o. F presenting to [facility] on 05/24/24 w/ dyspnea, dry cough, wheezing, and diarrhea for 3 days. Pt admitted w/ acute on chronic hypoxic respiratory failure due to COVID. 7/16 LLE Contrast CT showing two large hematomas extending from the L femoral neck to just above the knee. MD recommend LLE WBAT and to place ice compresses intermittently on the thigh. 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    PT Comments  Pt with fair tolerance to treatment today. Pt refusing RW. Mod A to transfer back to bed from recliner with HHA. Pt desat to 79% on 10L however was able to recover to 90% after ~5 minutes of PLB on 10L. Talked to pt about DC planning and pt remains adamant about returning home however would definitely benefit from SNF. DC recs updated to continued inpatient follow up therapy, <3 hours/day. PT will continue to follow.    If plan is discharge home, recommend the following: Help with stairs or ramp for entrance;A lot of help with walking and/or transfers;Supervision due to cognitive status;Assistance with cooking/housework;A little help with bathing/dressing/bathroom   Can travel by private vehicle     No  Equipment Recommendations  None recommended by PT    Recommendations for Other Services       Precautions / Restrictions Precautions Precautions: Fall;Other (comment) Recall of Precautions/Restrictions: Impaired Precaution/Restrictions Comments: Airborne/Contact precs; LLE edema, watch O2 Restrictions Weight Bearing Restrictions Per Provider Order: No     Mobility  Bed Mobility Overal bed mobility: Needs Assistance Bed Mobility: Sit to Supine       Sit to supine: Mod assist   General bed mobility  comments: Mod A for LLE management due to pain.    Transfers Overall transfer level: Needs assistance Equipment used: 1 person hand held assist Transfers: Sit to/from Stand, Bed to chair/wheelchair/BSC Sit to Stand: Min assist   Step pivot transfers: Mod assist       General transfer comment: Pt refusing RW. Mod A to transfer back to bed from recliner with HHA. Pt desat to 79% on 10L however was able to recover to 90% after ~5 minutes of PLB on 10L.    Ambulation/Gait               General Gait Details: Pt declined   Stairs             Wheelchair Mobility     Tilt Bed    Modified Rankin (Stroke Patients Only)       Balance Overall balance assessment: Needs assistance Sitting-balance support: Feet supported, Single extremity supported Sitting balance-Leahy Scale: Fair Sitting balance - Comments: leaning toward R side where rail is; LLE pain   Standing balance support: During functional activity, Reliant on assistive device for balance, Bilateral upper extremity supported Standing balance-Leahy Scale: Poor Standing balance comment: heavy reliance on HHA                            Communication Communication Communication: Impaired Factors Affecting Communication: Difficulty expressing self;Other (comment)  Cognition Arousal: Alert Behavior During Therapy: Flat affect   PT - Cognitive impairments: No family/caregiver present to determine baseline, Attention, Sequencing, Initiation, Problem solving, Safety/Judgement  PT - Cognition Comments: Unclear of pt baseline; some slow processing and pt having difficulty sequencing stepping/poor RW management while pivoting OOB to chair. Pt was lying flat in supine with phone on her cheek and when PTA asked if she was on the phone, pt states I haven't called anyone but phone was off the hook. Pt inconsistent with reporting symptoms when asked. Following commands:  Impaired Following commands impaired: Follows one step commands with increased time    Cueing Cueing Techniques: Verbal cues, Gestural cues, Tactile cues  Exercises      General Comments General comments (skin integrity, edema, etc.): Desat to 79% on 10L however was able to recover to 90% after ~5 minutes of PLB.      Pertinent Vitals/Pain Pain Assessment Pain Assessment: Faces Faces Pain Scale: Hurts even more Pain Location: L thigh and knee Pain Descriptors / Indicators: Aching, Discomfort, Grimacing, Guarding Pain Intervention(s): Monitored during session, Limited activity within patient's tolerance, Repositioned    Home Living                          Prior Function            PT Goals (current goals can now be found in the care plan section) Progress towards PT goals: Progressing toward goals    Frequency    Min 2X/week      PT Plan      Co-evaluation              AM-PAC PT 6 Clicks Mobility   Outcome Measure  Help needed turning from your back to your side while in a flat bed without using bedrails?: A Little Help needed moving from lying on your back to sitting on the side of a flat bed without using bedrails?: A Little Help needed moving to and from a bed to a chair (including a wheelchair)?: A Lot Help needed standing up from a chair using your arms (e.g., wheelchair or bedside chair)?: A Little Help needed to walk in hospital room?: Total Help needed climbing 3-5 steps with a railing? : Total 6 Click Score: 13    End of Session Equipment Utilized During Treatment: Gait belt;Oxygen  Activity Tolerance: Patient limited by fatigue;Patient limited by pain Patient left: in bed;with call bell/phone within reach;with bed alarm set Nurse Communication: Mobility status PT Visit Diagnosis: Difficulty in walking, not elsewhere classified (R26.2);Other abnormalities of gait and mobility (R26.89)     Time: 8662-8648 PT Time Calculation (min)  (ACUTE ONLY): 14 min  Charges:    $Therapeutic Activity: 8-22 mins PT General Charges $$ ACUTE PT VISIT: 1 Visit                     Sueellen NOVAK, PT, DPT Acute Rehab Services [phone]    Quintasha Gren 06/07/2024, 3:27 PM

## 2024-06-07 NOTE — Consult Note (Signed)
 [facility] Gastroenterology Consult  Referring Provider: No ref. provider found Primary Care Physician:  Beam, Lamar POUR, MD Primary Gastroenterologist: [facility] - Dr. Dianna   Reason for Consultation: Dark stools, diarrhea  SUBJECTIVE:   HPI: [name] is a 83 y.o. 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  Presented to hospital 05/24/2024 with chief complaint of dyspnea.  Diagnosed with COVID and required ICU level of care 05/24/2024 - 06/01/2024.  Hemoglobin has shown a downward trend from 05/30/2024, hemoglobin 6.1 on 06/06/2024 requiring blood transfusion 1 unit PRBC.  Per further questioning patient noted having dark in stools and diarrhea, GI was subsequently consulted.  EGD/colonoscopy 12/27/2021 for dysphagia, weight loss, history of polyps (Dr. Dianna) showed sigmoid diverticulosis, hepatic flexure tubular adenoma x 3, cecum inflammatory polyp,  medium-sized hiatal hernia, gastritis negative for H. pylori.  Small bowel enteroscopy 11/28/2010 (Dr. 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   Per discussion with nursing, patient has dark-colored stools which were fecal occult positive.  Consistency roughly [place] 6-7.  She is on baseline 4-6 L nasal cannula, currently on nasal cannula 10 L and promptly desaturates with movement (for example from bed to chair).  Patient remains on COVID precautions.  She is resting comfortably in bed on my evaluation.  She noted having shortness of breath and chest pain intermittently over the past few months.  She has been having loose stools recently.  She denied abdominal pain.  She had nausea yesterday, none today.  She is tolerating clear liquid diet.  She noted having roughly 40 pound unintentional weight loss over the past few months.  Past Medical  History:  Diagnosis Date   Anemia    NOS / GI bleed Jan 2012, transfused, 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, hold ASA 2-3 weeks - consider plavix instead of ASA   Asthma    Cellulitis 12/19/2019   right upper arm   Coronary artery disease    cath 11/2007, grafts patent /  Nuclear, June, 2011, prior inferior MI with mild peri-infarct ischemia, anterior breast attenuation, EF 67%, done for renal transplant assessment / Low level exercise/Lexiscan  Myoview  (11/2013): EF 67%, no ischemi; normal study   Diabetes mellitus    type 2   ESRD on dialysis [facility])    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, 2012   Family history of breast cancer    Family history of prostate cancer    GERD (gastroesophageal reflux disease)    GI bleed 11/26/2010   January, 2012 , AVM   Gout    History of methicillin resistant staphylococcus aureus (MRSA)    Hyperlipidemia    Hyperparathyroidism    Hypertension    Myocardial infarction [facility])    Osteoporosis    Pneumonia 08/2010    Hospitalization, October, 2011   Primary osteoarthritis, 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   S/P kidney transplant    September, 2012, [facility]   Subacromial impingement of 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   Past Surgical History:  Procedure Laterality Date   ABDOMINAL HYSTERECTOMY  1980'S   ARTERIOVENOUS GRAFT PLACEMENT Left 03/04/2007   forearm   BIOPSY  09/30/2019   Procedure: BIOPSY;  Surgeon: Dianna Specking, MD;  Location: [facility];  Service: Endoscopy;;   BIOPSY  12/27/2021   Procedure: BIOPSY;  Surgeon: Dianna Specking, MD;  Location: [facility];  Service: Endoscopy;;   BREAST EXCISIONAL BIOPSY Right  benign more than 10 yr ago   BREAST SURGERY  YRS AGO   RT BREAST CYST REMOVED    CARDIAC CATHETERIZATION  06/03/2002; 12/04/2007   COLONOSCOPY WITH PROPOFOL  N/A 07/14/2014   Procedure: COLONOSCOPY WITH PROPOFOL ;  Surgeon: Jerrell KYM Sol, MD;  Location: [facility];  Service: Endoscopy;  Laterality: N/A;   COLONOSCOPY WITH PROPOFOL  N/A 09/30/2019   Procedure:  COLONOSCOPY WITH PROPOFOL ;  Surgeon: Sol Jerrell, MD;  Location: [facility];  Service: Endoscopy;  Laterality: N/A;   COLONOSCOPY WITH PROPOFOL  N/A 12/27/2021   Procedure: COLONOSCOPY WITH PROPOFOL ;  Surgeon: Sol Jerrell, MD;  [facility]: [facility];  Service: Endoscopy;  Laterality: N/A;   CORONARY ARTERY BYPASS GRAFT  06/06/2002   x 4   ESOPHAGOGASTRODUODENOSCOPY N/A 12/27/2021   Procedure: ESOPHAGOGASTRODUODENOSCOPY (EGD);  Surgeon: Sol Jerrell, MD;  Location: [facility];  Service: Endoscopy;  Laterality: N/A;   HEMOSTASIS CLIP PLACEMENT  12/27/2021   Procedure: HEMOSTASIS CLIP PLACEMENT;  Surgeon: Sol Jerrell, MD;  [facility]: [facility];  Service: Endoscopy;;   HOT HEMOSTASIS N/A 07/14/2014   Procedure: HOT HEMOSTASIS (ARGON PLASMA COAGULATION/BICAP);  Surgeon: Jerrell KYM Sol, MD;  Location: [facility];  Service: Endoscopy;  Laterality: N/A;   IR ANGIOGRAM EXTREMITY LEFT  06/06/2024   IR FLUORO GUIDE CV LINE RIGHT  12/22/2019   IR US  GUIDE VASC ACCESS RIGHT  12/22/2019   IR US  GUIDE VASC ACCESS RIGHT  06/06/2024   KIDNEY TRANSPLANT Right 08/04/2011   ORIF PATELLA Left 04/06/2016   Procedure: OPEN REDUCTION INTERNAL (ORIF) LEFT  PATELLA;  Surgeon: Toribio JULIANNA Chancy, MD;  Location: [facility];  Service: Orthopedics;  Laterality: Left;   POLYPECTOMY  09/30/2019   Procedure: POLYPECTOMY;  Surgeon: Sol Jerrell, MD;  [facility];  Service: Endoscopy;;   POLYPECTOMY  12/27/2021   Procedure: POLYPECTOMY;  Surgeon: Sol Jerrell, MD;  [facility]: [facility];  Service: Endoscopy;;   SHOULDER ARTHROSCOPY WITH ROTATOR CUFF REPAIR AND SUBACROMIAL DECOMPRESSION Left 08/03/2016   Procedure: LEFT SHOULDER ARTHROSCOPY WITH ROTATOR CUFF REPAIR AND SUBACROMIAL DECOMPRESSION with distal claviculectomy and extentsive debridement;  Surgeon: Toribio JULIANNA Chancy, MD;  Location: [facility];  Service: Orthopedics;  Laterality: Left;  Block    THROMBECTOMY AND REVISION OF ARTERIOVENTOUS (AV) GORETEX  GRAFT Left 05/08/2007   forearm   TOTAL HIP ARTHROPLASTY  12/18/2012   Procedure: TOTAL HIP ARTHROPLASTY;  Surgeon: Toribio JULIANNA Chancy, MD;  Location: [facility] OR;  Service: Orthopedics;  Laterality: Left;   VESICOVAGINAL FISTULA CLOSURE W/ TAH  1984   Prior to Admission medications   Medication Sig Start Date End Date Taking? Authorizing Provider  acetaminophen  (TYLENOL ) 650 MG CR tablet Take 650 mg by mouth daily as needed for pain.   Yes [provider]  albuterol  (PROVENTIL ) (2.5 MG/3ML) 0.083% nebulizer solution Take 2.5 mg by nebulization every 6 (six) hours as needed for wheezing or shortness of breath.   Yes [provider]  albuterol  (VENTOLIN  HFA) 108 (90 Base) MCG/ACT inhaler Inhale 2 puffs into the lungs every 6 (six) hours as needed for wheezing or shortness of breath.   Yes [provider]  amLODipine  (NORVASC ) 10 MG tablet Take 10 mg by mouth daily.    Yes [provider]  aspirin  EC 81 MG tablet Take 81 mg by mouth daily.   Yes [provider]  budesonide  (PULMICORT ) 0.25 MG/2ML nebulizer solution Inhale 0.25 mg into the lungs in the morning, at noon, in the evening, and at bedtime. 02/11/20  Yes  [provider]  calcitRIOL  (ROCALTROL ) 0.25 MCG capsule Take 0.25 mcg by mouth daily.    Yes [provider]  ezetimibe  (ZETIA ) 10 MG tablet TAKE 1 TABLET BY MOUTH EVERY DAY 01/16/24  Yes Nahser, Aleene PARAS, MD  fluticasone  (FLONASE ) 50 MCG/ACT nasal spray Place 1 spray into both nostrils in the morning, at noon, and at bedtime. 05/30/21  Yes [provider]  Fluticasone -Umeclidin-Vilant (TRELEGY ELLIPTA) 200-62.5-25 MCG/ACT AEPB Inhale 1 puff into the lungs in the morning, at noon, and at bedtime.   Yes [provider]  isosorbide  mononitrate (IMDUR ) 30 MG 24 hr tablet Take 1 tablet (30 mg total) by mouth daily. 04/15/24  Yes Nahser, Aleene PARAS, MD  KLOR-CON  M20 20 MEQ  tablet TAKE 1 TABLET BY MOUTH EVERY DAY 11/22/23  Yes Nahser, Aleene PARAS, MD  metoprolol  tartrate (LOPRESSOR ) 50 MG tablet TAKE 1 TABLET BY MOUTH TWICE A DAY 07/24/23  Yes Nahser, Aleene PARAS, MD  nitroGLYCERIN  (NITROSTAT ) 0.4 MG SL tablet Place 1 tablet (0.4 mg total) under the tongue every 5 (five) minutes as needed for chest pain. 10/03/22  Yes Nahser, Aleene PARAS, MD  omeprazole (PRILOSEC) 20 MG capsule Take 20 mg by mouth daily after breakfast.   Yes [provider]  predniSONE  (DELTASONE ) 5 MG tablet Take 5 mg by mouth daily with breakfast.   Yes [provider]  tacrolimus  (PROGRAF ) 1 MG capsule Take 4 mg by mouth See admin instructions. Take 4 mg by mouth at 9 AM and 4 mg at 9 PM   Yes [provider]  Tiotropium Bromide  Monohydrate 1.25 MCG/ACT AERS Inhale 1 puff into the lungs in the morning and at bedtime. 10/23/17  Yes [provider]  torsemide  (DEMADEX ) 20 MG tablet Take 1 tablet (20 mg total) by mouth daily. 04/23/24 07/22/24 Yes Dahal, Chapman, MD  ONETOUCH VERIO test strip 1 EACH BY OTHER ROUTE DAILY IN THE AFTERNOON. USE AS INSTRUCTED 07/02/23   Shamleffer, Donell Cardinal, MD   Current Facility-Administered Medications  Medication Dose Route Frequency Provider Last Rate Last Admin   0.9 %  sodium chloride  infusion (Manually program via Guardrails IV Fluids)   Intravenous Once Syeda, Raeeha, DO       acetaminophen  (TYLENOL ) tablet 1,000 mg  1,000 mg Oral Q6H Gomez-Caraballo, Maria, MD   1,000 mg at 06/07/24 1129   amLODipine  (NORVASC ) tablet 10 mg  10 mg Oral Daily Claudene Toribio BROCKS, MD   10 mg at 06/07/24 9066   arformoterol  (BROVANA ) nebulizer solution 15 mcg  15 mcg Nebulization BID Gomez-Caraballo, Maria, MD   15 mcg at 06/07/24 9177   baricitinib  (OLUMIANT ) tablet 1 mg  1 mg Oral Daily Hunsucker, Donnice SAUNDERS, MD   1 mg at 06/07/24 0935   baricitinib  (OLUMIANT ) tablet 1 mg  1 mg Oral Daily Gomez-Caraballo, Maria, MD       budesonide  (PULMICORT ) nebulizer solution  0.5 mg  0.5 mg Nebulization BID Payne, John D, PA-C   0.5 mg at 06/07/24 9177   Chlorhexidine  Gluconate Cloth 2 % PADS 6 each  6 each Topical Daily Gomez-Caraballo, Maria, MD   6 each at 06/07/24 1129   docusate sodium  (COLACE) capsule 100 mg  100 mg Oral BID PRN Dub Mancel HERO, MD       ezetimibe  (ZETIA ) tablet 10 mg  10 mg Oral Daily Gomez-Caraballo, Maria, MD   10 mg at 06/07/24 9065   famotidine  (PEPCID ) tablet 20 mg  20 mg Oral QHS Dub Mancel HERO, MD  20 mg at 06/06/24 2231   guaiFENesin  (MUCINEX ) 12 hr tablet 1,200 mg  1,200 mg Oral BID Elnora Ip, MD   1,200 mg at 06/07/24 0935   insulin  aspart (novoLOG ) injection 0-5 Units  0-5 Units Subcutaneous QHS Myrna, Olivia, DO       insulin  aspart (novoLOG ) injection 0-9 Units  0-9 Units Subcutaneous TID WC King, Olivia, DO   2 Units at 06/07/24 1216   ipratropium-albuterol  (DUONEB) 0.5-2.5 (3) MG/3ML nebulizer solution 3 mL  3 mL Nebulization Q4H PRN Hunsucker, Donnice SAUNDERS, MD   3 mL at 05/25/24 1301   loperamide  (IMODIUM ) capsule 2-4 mg  2-4 mg Oral PRN Arloa Folks D, NP   4 mg at 06/05/24 9167   metoprolol  tartrate (LOPRESSOR ) tablet 50 mg  50 mg Oral BID Payne, John D, PA-C   50 mg at 06/07/24 0935   Oral care mouth rinse  15 mL Mouth Rinse PRN Jude Harden GAILS, MD       pantoprazole  (PROTONIX ) injection 40 mg  40 mg Intravenous BID King, Olivia, DO   40 mg at 06/07/24 1216   polycarbophil (FIBERCON) tablet 625 mg  625 mg Oral Daily Payne, John D, PA-C   625 mg at 06/07/24 0935   polyethylene glycol (MIRALAX  / GLYCOLAX ) packet 17 g  17 g Oral Daily PRN Dub Mancel HERO, MD       predniSONE  (DELTASONE ) tablet 5 mg  5 mg Oral Q breakfast Celestina Czar, MD   5 mg at 06/07/24 9065   revefenacin  (YUPELRI ) nebulizer solution 175 mcg  175 mcg Nebulization Daily Elnora Ip, MD   175 mcg at 06/07/24 9177   sodium chloride  flush (NS) 0.9 % injection 3 mL  3 mL Intravenous Q12H Elnora Ip, MD   3 mL at 06/07/24  9057   tacrolimus  (PROGRAF ) capsule 4 mg  4 mg Oral BID Dolan Mateo Larger, MD   4 mg at 06/07/24 0933   Allergies as of 05/24/2024 - Review Complete 05/24/2024  Allergen Reaction Noted   Sodium hypochlorite Shortness Of Breath 12/10/2019   Lactose Nausea And Vomiting 03/07/2016   Asa arthritis strength-antacid [aspirin  buffered] Nausea And Vomiting and Other (See Comments) 07/04/2011   Aspirin  Nausea And Vomiting 03/07/2016   Atorvastatin Other (See Comments)    Banana Nausea And Vomiting 10/16/2011   Lactose intolerance (gi) Nausea And Vomiting 12/05/2012   Lisinopril Cough 06/20/2012   Family History  Problem Relation Age of Onset   Prostate cancer Brother    Lung cancer Brother    Cancer Neg Hx    Social History   Socioeconomic History   Marital status: Divorced    Spouse name: Not on file   Number of children: Not on file   Years of education: Not on file   Highest education level: Not on file  Occupational History   Not on file  Tobacco Use   Smoking status: Former    Current packs/day: 0.00    Average packs/day: 1 pack/day for 8.0 years (8.0 ttl pk-yrs)    Types: Cigarettes    Start date: 11/21/1975    Quit date: 11/21/1983    Years since quitting: 40.5   Smokeless tobacco: Never  Vaping Use   Vaping status: Never Used  Substance and Sexual Activity   Alcohol use: No   Drug use: No   Sexual activity: Not on file  Other Topics Concern   Not on file  Social History Narrative   Not on file  Social Drivers of Corporate investment banker Strain: Low Risk  (04/02/2024)   Received from [facility]   Overall Financial Resource Strain (CARDIA)    Difficulty of Paying Living Expenses: Not hard at all  Food Insecurity: No Food Insecurity (05/25/2024)   Hunger Vital Sign    Worried About Running Out of Food in the Last Year: Never true    Ran Out of Food in the Last Year: Never true  Transportation Needs: No Transportation Needs (05/25/2024)   PRAPARE -  Administrator, Civil Service (Medical): No    Lack of Transportation (Non-Medical): No  Physical Activity: Sufficiently Active (08/09/2022)   Received from [facility]   Exercise Vital Sign    On average, how many days per week do you engage in moderate to strenuous exercise (like a brisk walk)?: 2 days    On average, how many minutes do you engage in exercise at this level?: 120 min  Stress: No Stress Concern Present (08/09/2022)   Received from [facility] - Occupational Stress Questionnaire    Feeling of Stress : Only a little  Social Connections: Moderately Integrated (05/25/2024)   Social Connection and Isolation Panel    Frequency of Communication with Friends and Family: More than three times a week    Frequency of Social Gatherings with Friends and Family: More than three times a week    Attends Religious Services: More than 4 times per year    Active Member of [employer] or Organizations: Yes    Attends Banker Meetings: More than 4 times per year    Marital Status: Widowed  Intimate Partner Violence: Not At Risk (05/25/2024)   Humiliation, Afraid, Rape, and Kick questionnaire    Fear of Current or Ex-Partner: No    Emotionally Abused: No    Physically Abused: No    Sexually Abused: No   Review of Systems:  Review of Systems  Respiratory:  Negative for shortness of breath.   Cardiovascular:  Negative for chest pain.  Gastrointestinal:  Negative for abdominal pain, nausea and vomiting.    OBJECTIVE:   Temp:  [98.5 F (36.9 C)-99.1 F (37.3 C)] 99.1 F (37.3 C) (07/19 1651) Pulse Rate:  [65-82] 82 (07/19 1651) Resp:  [14-20] 18 (07/19 1651) BP: (109-145)/(50-70) 118/62 (07/19 1651) SpO2:  [78 %-100 %] 94 % (07/19 1651) Weight:  [49.4 kg] 49.4 kg (07/19 0500) Last BM Date : 06/07/24 Physical Exam Constitutional:      General: She is not in acute distress.    Appearance: She is not ill-appearing,  toxic-appearing or diaphoretic.  Cardiovascular:     Rate and Rhythm: Normal rate. Rhythm irregular.  Pulmonary:     Effort: No respiratory distress.     Breath sounds: Normal breath sounds. No wheezing.     Comments: Supplemental oxygen  nasal cannula in place Abdominal:     General: Bowel sounds are normal. There is no distension.     Palpations: Abdomen is soft.     Tenderness: There is no abdominal tenderness. There is no guarding.  Skin:    General: Skin is warm and dry.  Neurological:     Mental Status: She is alert.     Labs: Recent Labs    06/06/24 0244 06/06/24 1422 06/07/24 0310  WBC 15.8* 11.3* 14.8*  HGB 6.1* 7.9* 7.1*  HCT 17.9* 22.3* 20.0*  PLT 164 157 157   BMET Recent Labs  06/05/24 0320 06/06/24 0244 06/07/24 0310  NA 136 134* 133*  K 4.7 4.6 4.7  CL 103 102 103  CO2 24 22 23   GLUCOSE 122* 128* 85  BUN 23 33* 36*  CREATININE 1.51* 2.44* 2.04*  CALCIUM  8.9 8.5* 8.1*   LFT Recent Labs    06/07/24 1023  PROT 4.9*  ALBUMIN 2.5*  AST 39  ALT 17  ALKPHOS 43  BILITOT 0.9  BILIDIR 0.1  IBILI 0.8   PT/INR No results for input(s): LABPROT, INR in the last 72 hours.  Diagnostic imaging: IR Angiogram Extremity Left Result Date: 06/06/2024 CLINICAL DATA:  Enlarging left thigh hematoma EXAM: LEFT EXTREMITY ARTERIOGRAPHY; IR ULTRASOUND GUIDANCE VASC ACCESS RIGHT ANESTHESIA/SEDATION: Intravenous Fentanyl  50mcg and Versed  1mg  were administered by RN during a total moderate (conscious) sedation time of 23 minutes; the patient's level of consciousness and physiological / cardiorespiratory status were monitored continuously by radiology RN under my direct supervision. MEDICATIONS: Lidocaine  1% subcutaneous CONTRAST:  40mL OMNIPAQUE  IOHEXOL  300 MG/ML SOLN, 10mL OMNIPAQUE  IOHEXOL  300 MG/ML SOLN PROCEDURE: The procedure, risks (including but not limited to bleeding, infection, organ damage ), benefits, and alternatives were explained to the patient.  Questions regarding the procedure were encouraged and answered. The patient understands and consents to the procedure. Right femoral region prepped and draped in usual sterile fashion. Maximal barrier sterile technique was utilized including caps, mask, sterile gowns, sterile gloves, sterile drape, hand hygiene and skin antiseptic. The right common femoral artery was localized under ultrasound. Under real-time ultrasound guidance, the vessel was accessed with a 21-gauge micropuncture needle, exchanged over a 018 guidewire for a transitional dilator, through which a 035 guidewire was advanced. Over this, a 5 [place] vascular sheath was placed, through which a 5 [place] C2 catheter was advanced and used to selectively catheterize the proximal left common femoral artery for selective left lower extremity arteriography in multiple projections. The catheter and sheath were removed and hemostasis achieved with the aid of the Celt device under ultrasound guidance. The patient tolerated the procedure well. COMPLICATIONS: None immediate FINDINGS: Common femoral widely patent. Extensive atheromatous plaque through the left SFA with tandem areas of moderate to severe short-segment stenosis. Deep femoral branches unremarkable. Proximal popliteal artery patent. No evidence of active extravasation, pseudoaneurysm, AVM, or other lesion to suggest a site or etiology of the patient's hematoma. IMPRESSION: 1. No acute arterial lesion or active extravasation. 2. Multiple tandem left SFA atheromatous stenoses of probable hemodynamic significance. Correlate with ABIs. Electronically Signed   By: JONETTA Faes M.D.   On: 06/06/2024 15:23   IR US  Guide Vasc Access Right Result Date: 06/06/2024 CLINICAL DATA:  Enlarging left thigh hematoma EXAM: LEFT EXTREMITY ARTERIOGRAPHY; IR ULTRASOUND GUIDANCE VASC ACCESS RIGHT ANESTHESIA/SEDATION: Intravenous Fentanyl  50mcg and Versed  1mg  were administered by RN during a total moderate (conscious)  sedation time of 23 minutes; the patient's level of consciousness and physiological / cardiorespiratory status were monitored continuously by radiology RN under my direct supervision. MEDICATIONS: Lidocaine  1% subcutaneous CONTRAST:  40mL OMNIPAQUE  IOHEXOL  300 MG/ML SOLN, 10mL OMNIPAQUE  IOHEXOL  300 MG/ML SOLN PROCEDURE: The procedure, risks (including but not limited to bleeding, infection, organ damage ), benefits, and alternatives were explained to the patient. Questions regarding the procedure were encouraged and answered. The patient understands and consents to the procedure. Right femoral region prepped and draped in usual sterile fashion. Maximal barrier sterile technique was utilized including caps, mask, sterile gowns, sterile gloves, sterile drape, hand hygiene and skin antiseptic. The right common femoral artery  was localized under ultrasound. Under real-time ultrasound guidance, the vessel was accessed with a 21-gauge micropuncture needle, exchanged over a 018 guidewire for a transitional dilator, through which a 035 guidewire was advanced. Over this, a 5 [place] vascular sheath was placed, through which a 5 [place] C2 catheter was advanced and used to selectively catheterize the proximal left common femoral artery for selective left lower extremity arteriography in multiple projections. The catheter and sheath were removed and hemostasis achieved with the aid of the Celt device under ultrasound guidance. The patient tolerated the procedure well. COMPLICATIONS: None immediate FINDINGS: Common femoral widely patent. Extensive atheromatous plaque through the left SFA with tandem areas of moderate to severe short-segment stenosis. Deep femoral branches unremarkable. Proximal popliteal artery patent. No evidence of active extravasation, pseudoaneurysm, AVM, or other lesion to suggest a site or etiology of the patient's hematoma. IMPRESSION: 1. No acute arterial lesion or active extravasation. 2. Multiple tandem  left SFA atheromatous stenoses of probable hemodynamic significance. Correlate with ABIs. Electronically Signed   By: JONETTA Faes M.D.   On: 06/06/2024 15:23   IMPRESSION: Fecal occult positive stool Microcytic anemia Acute diarrhea Hypoxemic respiratory failure, COVID-19 positive  Congestive heart failure COPD on 4-6 L nasal cannula baseline History end-stage renal disease status post kidney transplant 2012  PLAN: -No apparent melena per discussion with nursing, increased oxygen  requirements from baseline -She would ultimately benefit from EGD and colonoscopy to evaluate fecal occult positive stool and personal history of colon polyps, though risks of anesthesia outweigh benefits currently, would not pursue urgent endoscopy at this time -Check GI pathogen panel for completeness -Trend H/H, transfuse for hemoglobin less than 7 -Okay for diet as tolerated -[facility] will follow   LOS: 14 days   Estefana Keas, [facility]

## 2024-06-07 NOTE — Progress Notes (Signed)
 Placed patient back on heated high cannula due to decrease in Sp02

## 2024-06-07 NOTE — Progress Notes (Signed)
 Pt voided in and had a BM in [facility]. Post void residual was 41.

## 2024-06-08 ENCOUNTER — Inpatient Hospital Stay (HOSPITAL_COMMUNITY)

## 2024-06-08 ENCOUNTER — Other Ambulatory Visit (HOSPITAL_COMMUNITY)

## 2024-06-08 DIAGNOSIS — R0609 Other forms of dyspnea: Secondary | ICD-10-CM | POA: Diagnosis not present

## 2024-06-08 DIAGNOSIS — J9601 Acute respiratory failure with hypoxia: Secondary | ICD-10-CM | POA: Diagnosis not present

## 2024-06-08 DIAGNOSIS — E1122 Type 2 diabetes mellitus with diabetic chronic kidney disease: Secondary | ICD-10-CM

## 2024-06-08 DIAGNOSIS — N1831 Chronic kidney disease, stage 3a: Secondary | ICD-10-CM | POA: Diagnosis not present

## 2024-06-08 DIAGNOSIS — N179 Acute kidney failure, unspecified: Secondary | ICD-10-CM | POA: Diagnosis not present

## 2024-06-08 DIAGNOSIS — N183 Chronic kidney disease, stage 3 unspecified: Secondary | ICD-10-CM

## 2024-06-08 DIAGNOSIS — I5032 Chronic diastolic (congestive) heart failure: Secondary | ICD-10-CM

## 2024-06-08 DIAGNOSIS — J440 Chronic obstructive pulmonary disease with acute lower respiratory infection: Secondary | ICD-10-CM

## 2024-06-08 DIAGNOSIS — S7012XA Contusion of left thigh, initial encounter: Secondary | ICD-10-CM

## 2024-06-08 DIAGNOSIS — D649 Anemia, unspecified: Secondary | ICD-10-CM

## 2024-06-08 LAB — CBC
HCT: 19 % — ABNORMAL LOW (ref 36.0–46.0)
HCT: 22.5 % — ABNORMAL LOW (ref 36.0–46.0)
Hemoglobin: 6.7 g/dL — CL (ref 12.0–15.0)
Hemoglobin: 7.9 g/dL — ABNORMAL LOW (ref 12.0–15.0)
MCH: 27.6 pg (ref 26.0–34.0)
MCH: 27.6 pg (ref 26.0–34.0)
MCHC: 35.3 g/dL (ref 30.0–36.0)
MCHC: 35.1 g/dL (ref 30.0–36.0)
MCV: 78.2 fL — ABNORMAL LOW (ref 80.0–100.0)
MCV: 78.7 fL — ABNORMAL LOW (ref 80.0–100.0)
Platelets: 165 K/uL (ref 150–400)
Platelets: 160 K/uL (ref 150–400)
RBC: 2.43 MIL/uL — ABNORMAL LOW (ref 3.87–5.11)
RBC: 2.86 MIL/uL — ABNORMAL LOW (ref 3.87–5.11)
RDW: 19.9 % — ABNORMAL HIGH (ref 11.5–15.5)
RDW: 18.7 % — ABNORMAL HIGH (ref 11.5–15.5)
WBC: 14.6 K/uL — ABNORMAL HIGH (ref 4.0–10.5)
WBC: 16.5 K/uL — ABNORMAL HIGH (ref 4.0–10.5)
nRBC: 0.6 % — ABNORMAL HIGH (ref 0.0–0.2)
nRBC: 0.4 % — ABNORMAL HIGH (ref 0.0–0.2)

## 2024-06-08 LAB — URINALYSIS, ROUTINE W REFLEX MICROSCOPIC
Bacteria, UA: NONE SEEN
Bilirubin Urine: NEGATIVE
Glucose, UA: NEGATIVE mg/dL
Ketones, ur: NEGATIVE mg/dL
Nitrite: NEGATIVE
Protein, ur: 30 mg/dL — AB
Specific Gravity, Urine: 1.012 (ref 1.005–1.030)
pH: 5 (ref 5.0–8.0)

## 2024-06-08 LAB — RENAL FUNCTION PANEL
Albumin: 2.6 g/dL — ABNORMAL LOW (ref 3.5–5.0)
Anion gap: 9 (ref 5–15)
BUN: 31 mg/dL — ABNORMAL HIGH (ref 8–23)
CO2: 23 mmol/L (ref 22–32)
Calcium: 8.5 mg/dL — ABNORMAL LOW (ref 8.9–10.3)
Chloride: 103 mmol/L (ref 98–111)
Creatinine, Ser: 1.85 mg/dL — ABNORMAL HIGH (ref 0.44–1.00)
GFR, Estimated: 27 mL/min — ABNORMAL LOW (ref >60)
Glucose, Bld: 87 mg/dL (ref 70–99)
Phosphorus: 3.8 mg/dL (ref 2.5–4.6)
Potassium: 4.3 mmol/L (ref 3.5–5.1)
Sodium: 135 mmol/L (ref 135–145)

## 2024-06-08 LAB — GLUCOSE, CAPILLARY
Glucose-Capillary: 95 mg/dL (ref 70–99)
Glucose-Capillary: 128 mg/dL — ABNORMAL HIGH (ref 70–99)
Glucose-Capillary: 168 mg/dL — ABNORMAL HIGH (ref 70–99)
Glucose-Capillary: 93 mg/dL (ref 70–99)

## 2024-06-08 LAB — ECHOCARDIOGRAM LIMITED
Area-P 1/2: 4.89 cm2
Height: 62 in
S' Lateral: 2.5 cm
Weight: 1742.52 [oz_av]

## 2024-06-08 LAB — PREPARE RBC (CROSSMATCH)

## 2024-06-08 LAB — C DIFFICILE (CDIFF) QUICK SCRN (NO PCR REFLEX)
C Diff antigen: NEGATIVE
C Diff interpretation: NOT DETECTED
C Diff toxin: NEGATIVE

## 2024-06-08 LAB — PROCALCITONIN: Procalcitonin: 2.4 ng/mL

## 2024-06-08 LAB — OCCULT BLOOD X 1 CARD TO LAB, STOOL: Fecal Occult Bld: POSITIVE — AB

## 2024-06-08 LAB — ERYTHROPOIETIN: Erythropoietin: 1330 m[IU]/mL — ABNORMAL HIGH (ref 2.6–18.5)

## 2024-06-08 MED ORDER — FUROSEMIDE 10 MG/ML IJ SOLN
60.0000 mg | Freq: Once | INTRAMUSCULAR | Status: DC
  Filled 2024-06-08: qty 6

## 2024-06-08 MED ORDER — HYDROMORPHONE HCL 2 MG PO TABS
1.0000 mg | ORAL_TABLET | Freq: Once | ORAL | Status: AC
  Administered 2024-06-08: 1 mg via ORAL
  Filled 2024-06-08: qty 1

## 2024-06-08 MED ORDER — SODIUM CHLORIDE 0.9% IV SOLUTION: Freq: Once | INTRAVENOUS | Status: AC

## 2024-06-08 MED ORDER — SODIUM CHLORIDE 0.9 % IV SOLN: INTRAVENOUS | Status: AC

## 2024-06-08 MED ORDER — SODIUM CHLORIDE 0.9 % IV SOLN
1.5000 g | Freq: Two times a day (BID) | INTRAVENOUS | Status: DC
  Filled 2024-06-08 (×3): qty 4

## 2024-06-08 NOTE — Progress Notes (Signed)
 Pt taken to CT and back on BiPAP with no complications.

## 2024-06-08 NOTE — Progress Notes (Signed)
 RT called to room to assess if patient was able to be weaned from [facility] per MD.  RT found patient labored breathing.  SpO2 was 94%, RT weaned down the FiO2 but continued with the 30L of flow due to labored breathing.  RN present.  RT will continue to monitor.   06/08/24 1200  Therapy Vitals  Pulse Rate 89  Resp (!) 24  MEWS Score/Color  MEWS Score 1  MEWS Score Color Green  Respiratory Assessment  Assessment Type Assess only  Respiratory Pattern Labored  Chest Assessment Chest expansion symmetrical  Cough None  Sputum Amount None  Oxygen  Therapy/Pulse Ox  O2 Device HHFNC  O2 Therapy Oxygen  humidified  Heater temperature 93.2 F (34 C)  O2 Flow Rate (L/min) 30 L/min  FiO2 (%) (S)  50 %  SpO2 94 %

## 2024-06-08 NOTE — Progress Notes (Addendum)
 I was asked to provide nephrology clearance note in order to receive IV contrast for life threatening condition.  I have reviewed the patient's chart. She has a history of Chronic Kidney Disease stage 4 and requires IV contrast for imaging to rule out PE which is a potentially life-threatening condition.  Assessment and Plan: In patients with CKD, the use of IV contrast carries a risk of contrast-induced nephropathy (CIN), which can further impair kidney function.  However, in this case, the clinical benefit of obtaining diagnostic imaging to rule out a life-threatening condition outweighs the potential risk of renal injury.  -To minimize the risk of CIN, intravenous fluids (IVF) should administered before and after the contrast injection. This helps to ensure adequate hydration and improves renal perfusion. - Recommend isotonic IVF (NS) at least 100 cc/hr 1-2 hr pre and post contrast. -Renal function should be closely monitored following the procedure, with serum creatinine and urine output assessments. -The dose of IV contrast will be minimized to the lowest effective amount to reduce the risk of nephropathy. -Use of low-osmolar or iso-osmolar contrast agents is recommended, as these are associated with a lower risk of CIN.  Conclusion: Proceed with imaging if the benefit outweighs the risk. The management strategy will focus on adequate hydration and minimizing the contrast dose to mitigate the risk of renal failure. Please call us  back with questions. D/w primary team. [name],  CK[facility]

## 2024-06-08 NOTE — Progress Notes (Signed)
   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 No f/c/r, LL edema, CP, N/V, abd pain, rash, melena, or blood per rectum. Given Lasix  120 mg, Solumedrol 125 mg, Zosyn , and Linezolid . Reportedly oxygen  saturation was around 55% on 4 L, treated with a DuoNeb with improvement of saturation to 83% on NRM. On BiPAP 08/25/59%, RR 15, saturating at 95%. COVID positive.   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 On BiPAP 08/25/59%, RR 15, saturating at 95%. 05/24/2024: will admit to ICU 7/7 placed on heated HHFNC , off bipap 7/20 Re-consulted for worsening hypoxia   Interim History / Subjective:  PCCM was reconsulted due to increasing oxygen  requirement Patient stated breathing is not great, denies chest pain Remained afebrile  Objective    Blood pressure (!) 143/57, pulse 84, temperature 99.5 F (37.5 C), temperature source Oral, resp. rate 20, height 5' 2 (1.575 m), weight  49.4 kg, last menstrual period 03/04/2022, SpO2 97%.    FiO2 (%):  [40 %-70 %] 40 %   Intake/Output Summary (Last 24 hours) at 06/08/2024 1817 Last data filed at 06/08/2024 1800 Gross per 24 hour  Intake 696.44 ml  Output --  Net 696.44 ml   Filed Weights   06/06/24 0500 06/07/24 0500 06/08/24 0500  Weight: 49.9 kg 49.4 kg 49.4 kg    Examination: General: Chronically ill-appearing elderly female, lying on the bed HEENT: [place]/AT, eyes anicteric.  moist mucus membranes Neuro: Alert, awake following commands Chest: Reduced air entry at the bases bilaterally, no wheezes or rhonchi Heart: Regular rate and rhythm, no murmurs or gallops Abdomen: Soft, nontender, nondistended, bowel sounds present  X-ray chest: Reviewed myself, showing increasing right lower lobe infiltrate   Resolved problem list   Assessment and Plan  Acute on chronic hypoxemic respiratory failure due to COVID-pneumonia +/- aspiration PNA Underlying chronic obstructive pulmonary disease Acute kidney injury on chronic kidney disease stage IIIa, improving S/p kidney transplant 2012- prograf  and pred PTA; US  looks fine 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

## 2024-06-08 NOTE — Progress Notes (Signed)
 Saw patient at bedside today.  Patient was on 30L HHFNC saturating in the 70s.  Patient was tachypneic and had labored breathing.  Recent echo showed increased pulmonary artery systolic pressures.  We suspect her current symptoms are likely due to fluid overload versus PE, with a low suspicion for the latter has patient just recently got off of anticoagulation 4 days ago.  After discussing with Dr. Lovie, we decided to see how patient does on BiPAP and for her to get CTA.  Patient will be placed on BiPAP 10 minutes before her CT to see if she tolerates it.  If patient does well she we will proceed to get her CT.  She will then continue with BiPAP if her O2 stats is around her baseline.

## 2024-06-08 NOTE — Progress Notes (Signed)
 Initial Nutrition Assessment  DOCUMENTATION CODES:   Not applicable  INTERVENTION:  Advance diet to regular diet; discussed with GI Protein containing snacks TID between meals to optimize nutritional adequacy  NUTRITION DIAGNOSIS:   Increased nutrient needs related to acute illness as evidenced by estimated needs.  GOAL:   Patient will meet greater than or equal to 90% of their needs  MONITOR:   PO intake, Supplement acceptance, Diet advancement, Labs, Weight trends  REASON FOR ASSESSMENT:   Consult Assessment of nutrition requirement/status  ASSESSMENT:   Pt admitted with worsening SOB and diarrhea x3 days, found to have COVID-pneumonia. PMH significant for HTN, ESRD s/p renal transplant (2012), COPD/asthma, CAD/CABG, multiple recent hospitalizations for COPD/asthma and CHF exacerbation, dCHF.  RD working remotely. Unsuccessful attempt to reach pt via phone call to room.  Pt notably continues with increased dyspnea.   During admission there has been concern for GIB. GI consulted however no plan for endoscopy d/t risk associated with progressive hypoxemia. No acute concern for GIB at this time.   Diet downgraded to clear liquids yesterday pending evaluation by GI. Advance diet as tolerated per GI note, reached out to confirm it would be ok to advance to a solid consistency diet.   Lactose intolerance documented in allergy list with side effect of nausea and vomiting.    Per review of chart, pt reported having a weight loss of about 40 lbs unintentionally over the last few months.    First measured weight on admission (7/6): 51.7 kg Current weight: 49.4 kg This is a weight loss of 4.4% during admission.  Comparatively pt appears to have had a weight loss of 18% between 10/08/23-06/08/24 which is clinically significant for time frame.   Despite acute exacerbation of weight loss this appears to be a more chronic concern. Suspect pt with a degree of malnutrition given  significance of weight loss. Will reassess upon follow up.    Medications: SSI 0-5 units at bedtime, SSI 0-9 units TID, fibercon, prednisone   Labs:  BUN 31 Cr 1.85 GFR 27 CBG's 95-163 x24 hours   NUTRITION - FOCUSED PHYSICAL EXAM: RD working remotely. Deferred to in person assesment.   Diet Order:   Diet Order             Diet regular Room service appropriate? Yes with Assist; Fluid consistency: Thin  Diet effective now                   EDUCATION NEEDS:   No education needs have been identified at this time  Skin:  Skin Assessment: Reviewed RN Assessment  Last BM:  7/19 type 6 medium, brown  Height:   Ht Readings from Last 1 Encounters:  05/24/24 5' 2 (1.575 m)    Weight:   Wt Readings from Last 1 Encounters:  06/08/24 49.4 kg   BMI:  Body mass index is 19.92 kg/m.  Estimated Nutritional Needs:   Kcal:  1400-1600  Protein:  70-80g  Fluid:  >/=1.5L  Royce Maris, RDN, LDN Clinical Nutrition See [facility] for contact information.

## 2024-06-08 NOTE — Progress Notes (Signed)
 IV team consulted for peripheral IV that requires ultrasound placement. Patient will receive order PRBC following IV placement as patient has no other access.

## 2024-06-08 NOTE — Progress Notes (Signed)
 [facility] Progress Note  SUBJECTIVE:   Interval history: [name] was seen and evaluated today at bedside.  Resting in bed.  Now on high flow nasal cannula.  Noted having some lower extremity pain this morning improved with Tylenol .  No abdominal pain.  No nausea vomiting.  Stool tests are ordered and pending.  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, hold ASA 2-3 weeks - consider plavix instead of ASA   Asthma    Cellulitis 12/19/2019   right upper arm   Coronary artery disease    cath 11/2007, grafts patent /  Nuclear, June, 2011, prior inferior MI with mild peri-infarct ischemia, anterior breast attenuation, EF 67%, done for renal transplant assessment / Low level exercise/Lexiscan  Myoview  (11/2013): EF 67%, no ischemi; normal study   Diabetes mellitus    type 2   ESRD on dialysis [facility])    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, 2012   Family history of breast cancer    Family history of prostate cancer    GERD (gastroesophageal reflux disease)    GI bleed 11/26/2010   January, 2012 , AVM   Gout    History of methicillin resistant staphylococcus aureus (MRSA)    Hyperlipidemia    Hyperparathyroidism    Hypertension    Myocardial infarction [facility])    Osteoporosis    Pneumonia 08/2010    Hospitalization, October, 2011   Primary osteoarthritis, 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   S/P kidney transplant    September, 2012, [facility]   Subacromial impingement of 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   Past Surgical History:  Procedure Laterality Date   ABDOMINAL HYSTERECTOMY  1980'S   ARTERIOVENOUS GRAFT PLACEMENT Left 03/04/2007   forearm   BIOPSY  09/30/2019   Procedure: BIOPSY;  Surgeon: Dianna Specking, MD;  Location: [facility];  Service: Endoscopy;;   BIOPSY  12/27/2021   Procedure: BIOPSY;  Surgeon: Dianna Specking, MD;  Location: [facility];  Service: Endoscopy;;   BREAST EXCISIONAL BIOPSY Right    benign more than 10  yr ago   BREAST SURGERY  YRS AGO   RT BREAST CYST REMOVED    CARDIAC CATHETERIZATION  06/03/2002; 12/04/2007   COLONOSCOPY WITH PROPOFOL  N/A 07/14/2014   Procedure: COLONOSCOPY WITH PROPOFOL ;  Surgeon: Specking KYM Dianna, MD;  Location: [facility];  Service: Endoscopy;  Laterality: N/A;   COLONOSCOPY WITH PROPOFOL  N/A 09/30/2019   Procedure: COLONOSCOPY WITH PROPOFOL ;  Surgeon: Dianna Specking, MD;  Location: [facility];  Service: Endoscopy;  Laterality: N/A;   COLONOSCOPY WITH PROPOFOL  N/A 12/27/2021   Procedure: COLONOSCOPY WITH PROPOFOL ;  Surgeon: Dianna Specking, MD;  Location: [facility];  Service: Endoscopy;  Laterality: N/A;   CORONARY ARTERY BYPASS GRAFT  06/06/2002   x 4   ESOPHAGOGASTRODUODENOSCOPY N/A 12/27/2021   Procedure: ESOPHAGOGASTRODUODENOSCOPY (EGD);  Surgeon: Dianna Specking, MD;  Location: [facility];  Service: Endoscopy;  Laterality: N/A;   HEMOSTASIS CLIP PLACEMENT  12/27/2021   Procedure: HEMOSTASIS CLIP PLACEMENT;  Surgeon: Dianna Specking, MD;  Location: [facility];  Service: Endoscopy;;   HOT HEMOSTASIS N/A 07/14/2014   Procedure: HOT HEMOSTASIS (ARGON PLASMA COAGULATION/BICAP);  Surgeon: Specking KYM Dianna, MD;  Location: [facility];  Service: Endoscopy;  Laterality: N/A;   IR ANGIOGRAM EXTREMITY LEFT  06/06/2024   IR FLUORO GUIDE CV LINE RIGHT  12/22/2019   IR US  GUIDE VASC ACCESS RIGHT  12/22/2019   IR US  GUIDE VASC ACCESS RIGHT  06/06/2024  KIDNEY TRANSPLANT Right 08/04/2011   ORIF PATELLA Left 04/06/2016   Procedure: OPEN REDUCTION INTERNAL (ORIF) LEFT  PATELLA;  Surgeon: Toribio JULIANNA Chancy, MD;  Location: [facility];  Service: Orthopedics;  Laterality: Left;   POLYPECTOMY  09/30/2019   Procedure: POLYPECTOMY;  Surgeon: Dianna Specking, MD;  Location: [facility];  Service: Endoscopy;;   POLYPECTOMY  12/27/2021   Procedure: POLYPECTOMY;  Surgeon: Dianna Specking, MD;  Location: [facility];  Service: Endoscopy;;   SHOULDER ARTHROSCOPY  WITH ROTATOR CUFF REPAIR AND SUBACROMIAL DECOMPRESSION Left 08/03/2016   Procedure: LEFT SHOULDER ARTHROSCOPY WITH ROTATOR CUFF REPAIR AND SUBACROMIAL DECOMPRESSION with distal claviculectomy and extentsive debridement;  Surgeon: Toribio JULIANNA Chancy, MD;  Location: [facility];  Service: Orthopedics;  Laterality: Left;  Block   THROMBECTOMY AND REVISION OF ARTERIOVENTOUS (AV) GORETEX  GRAFT Left 05/08/2007   forearm   TOTAL HIP ARTHROPLASTY  12/18/2012   Procedure: TOTAL HIP ARTHROPLASTY;  Surgeon: Toribio JULIANNA Chancy, MD;  [facility];  Service: Orthopedics;  Laterality: Left;   VESICOVAGINAL FISTULA CLOSURE W/ TAH  1984   Current Facility-Administered Medications  Medication Dose Route Frequency Provider Last Rate Last Admin   0.9 %  sodium chloride  infusion (Manually program via Guardrails IV Fluids)   Intravenous Once Syeda, Raeeha, DO       0.9 %  sodium chloride  infusion (Manually program via Guardrails IV Fluids)   Intravenous Once Marylu Gee, DO       acetaminophen  (TYLENOL ) tablet 1,000 mg  1,000 mg Oral Q6H Gomez-Caraballo, Maria, MD   1,000 mg at 06/07/24 1817   amLODipine  (NORVASC ) tablet 10 mg  10 mg Oral Daily Claudene Toribio BROCKS, MD   10 mg at 06/07/24 9066   arformoterol  (BROVANA ) nebulizer solution 15 mcg  15 mcg Nebulization BID Gomez-Caraballo, Maria, MD   15 mcg at 06/08/24 0803   baricitinib  (OLUMIANT ) tablet 1 mg  1 mg Oral Daily Hunsucker, Donnice SAUNDERS, MD   1 mg at 06/07/24 0935   baricitinib  (OLUMIANT ) tablet 1 mg  1 mg Oral Daily Gomez-Caraballo, Maria, MD       budesonide  (PULMICORT ) nebulizer solution 0.5 mg  0.5 mg Nebulization BID Payne, John D, PA-C   0.5 mg at 06/08/24 0803   Chlorhexidine  Gluconate Cloth 2 % PADS 6 each  6 each Topical Daily Gomez-Caraballo, Maria, MD   6 each at 06/07/24 1129   docusate sodium  (COLACE) capsule 100 mg  100 mg Oral BID PRN Dub Mancel HERO, MD       ezetimibe  (ZETIA ) tablet 10 mg  10 mg Oral Daily Gomez-Caraballo, Maria, MD    10 mg at 06/07/24 9065   famotidine  (PEPCID ) tablet 20 mg  20 mg Oral QHS Albustami, Omar M, MD   20 mg at 06/07/24 2212   guaiFENesin  (MUCINEX ) 12 hr tablet 1,200 mg  1,200 mg Oral BID Gomez-Caraballo, Maria, MD   1,200 mg at 06/07/24 2212   insulin  aspart (novoLOG ) injection 0-5 Units  0-5 Units Subcutaneous QHS Myrna, Olivia, DO       insulin  aspart (novoLOG ) injection 0-9 Units  0-9 Units Subcutaneous TID WC King, Olivia, DO   1 Units at 06/07/24 1700   ipratropium-albuterol  (DUONEB) 0.5-2.5 (3) MG/3ML nebulizer solution 3 mL  3 mL Nebulization Q4H PRN Hunsucker, Donnice SAUNDERS, MD   3 mL at 05/25/24 1301   loperamide  (IMODIUM ) capsule 2-4 mg  2-4 mg Oral PRN Arloa Folks D, NP   4 mg at 06/05/24 [phone]  metoprolol  tartrate (LOPRESSOR ) tablet 50 mg  50 mg Oral BID Payne, John D, PA-C   50 mg at 06/07/24 2226   Oral care mouth rinse  15 mL Mouth Rinse PRN Alva, Rakesh V, MD       pantoprazole  (PROTONIX ) injection 40 mg  40 mg Intravenous BID Myrna, Olivia, DO   40 mg at 06/07/24 2212   polycarbophil (FIBERCON) tablet 625 mg  625 mg Oral Daily Payne, John D, PA-C   625 mg at 06/07/24 0935   polyethylene glycol (MIRALAX  / GLYCOLAX ) packet 17 g  17 g Oral Daily PRN Dub Mancel HERO, MD       predniSONE  (DELTASONE ) tablet 5 mg  5 mg Oral Q breakfast Koomson, Julius, MD   5 mg at 06/07/24 9065   revefenacin  (YUPELRI ) nebulizer solution 175 mcg  175 mcg Nebulization Daily Gomez-Caraballo, Maria, MD   175 mcg at 06/08/24 9196   sodium chloride  flush (NS) 0.9 % injection 3 mL  3 mL Intravenous Q12H Elnora Ip, MD   3 mL at 06/07/24 2219   tacrolimus  (PROGRAF ) capsule 4 mg  4 mg Oral BID Dolan Mateo Larger, MD   4 mg at 06/07/24 2212   Allergies as of 05/24/2024 - Review Complete 05/24/2024  Allergen Reaction Noted   Sodium hypochlorite Shortness Of Breath 12/10/2019   Lactose Nausea And Vomiting 03/07/2016   Asa arthritis strength-antacid [aspirin  buffered] Nausea And Vomiting and Other  (See Comments) 07/04/2011   Aspirin  Nausea And Vomiting 03/07/2016   Atorvastatin Other (See Comments)    Banana Nausea And Vomiting 10/16/2011   Lactose intolerance (gi) Nausea And Vomiting 12/05/2012   Lisinopril Cough 06/20/2012   Review of Systems:  Review of Systems  Gastrointestinal:  Negative for abdominal pain, nausea and vomiting.    OBJECTIVE:   Temp:  [98.1 F (36.7 C)-99.8 F (37.7 C)] 98.1 F (36.7 C) (07/20 0754) Pulse Rate:  [74-90] 83 (07/20 0803) Resp:  [13-20] 20 (07/20 0754) BP: (118-152)/(52-69) 135/69 (07/20 0754) SpO2:  [78 %-96 %] 93 % (07/20 0808) FiO2 (%):  [66 %-70 %] 66 % (07/20 0808) Weight:  [49.4 kg] 49.4 kg (07/20 0500) Last BM Date : 06/07/24 Physical Exam Constitutional:      General: She is not in acute distress.    Appearance: She is not ill-appearing, toxic-appearing or diaphoretic.  Cardiovascular:     Rate and Rhythm: Normal rate and regular rhythm.  Pulmonary:     Effort: No respiratory distress.     Breath sounds: Normal breath sounds.     Comments: High flow nasal cannula Abdominal:     General: Bowel sounds are normal. There is no distension.     Palpations: Abdomen is soft.     Tenderness: There is no abdominal tenderness. There is no guarding.  Neurological:     Mental Status: She is alert.     Labs: Recent Labs    06/06/24 1422 06/07/24 0310 06/08/24 0404  WBC 11.3* 14.8* 14.6*  HGB 7.9* 7.1* 6.7*  HCT 22.3* 20.0* 19.0*  PLT 157 157 165   BMET Recent Labs    06/06/24 0244 06/07/24 0310 06/08/24 0404  NA 134* 133* 135  K 4.6 4.7 4.3  CL 102 103 103  CO2 22 23 23   GLUCOSE 128* 85 87  BUN 33* 36* 31*  CREATININE 2.44* 2.04* 1.85*  CALCIUM  8.5* 8.1* 8.5*   LFT Recent Labs    06/07/24 1023 06/08/24 0404  PROT 4.9*  --  ALBUMIN 2.5* 2.6*  AST 39  --   ALT 17  --   ALKPHOS 43  --   BILITOT 0.9  --   BILIDIR 0.1  --   IBILI 0.8  --    PT/INR No results for input(s): LABPROT, INR in the last 72  hours. Diagnostic imaging: IR Angiogram Extremity Left Result Date: 06/06/2024 CLINICAL DATA:  Enlarging left thigh hematoma EXAM: LEFT EXTREMITY ARTERIOGRAPHY; IR ULTRASOUND GUIDANCE VASC ACCESS RIGHT ANESTHESIA/SEDATION: Intravenous Fentanyl  50mcg and Versed  1mg  were administered by RN during a total moderate (conscious) sedation time of 23 minutes; the patient's level of consciousness and physiological / cardiorespiratory status were monitored continuously by radiology RN under my direct supervision. MEDICATIONS: Lidocaine  1% subcutaneous CONTRAST:  40mL OMNIPAQUE  IOHEXOL  300 MG/ML SOLN, 10mL OMNIPAQUE  IOHEXOL  300 MG/ML SOLN PROCEDURE: The procedure, risks (including but not limited to bleeding, infection, organ damage ), benefits, and alternatives were explained to the patient. Questions regarding the procedure were encouraged and answered. The patient understands and consents to the procedure. Right femoral region prepped and draped in usual sterile fashion. Maximal barrier sterile technique was utilized including caps, mask, sterile gowns, sterile gloves, sterile drape, hand hygiene and skin antiseptic. The right common femoral artery was localized under ultrasound. Under real-time ultrasound guidance, the vessel was accessed with a 21-gauge micropuncture needle, exchanged over a 018 guidewire for a transitional dilator, through which a 035 guidewire was advanced. Over this, a 5 [place] vascular sheath was placed, through which a 5 [place] C2 catheter was advanced and used to selectively catheterize the proximal left common femoral artery for selective left lower extremity arteriography in multiple projections. The catheter and sheath were removed and hemostasis achieved with the aid of the Celt device under ultrasound guidance. The patient tolerated the procedure well. COMPLICATIONS: None immediate FINDINGS: Common femoral widely patent. Extensive atheromatous plaque through the left SFA with tandem areas of  moderate to severe short-segment stenosis. Deep femoral branches unremarkable. Proximal popliteal artery patent. No evidence of active extravasation, pseudoaneurysm, AVM, or other lesion to suggest a site or etiology of the patient's hematoma. IMPRESSION: 1. No acute arterial lesion or active extravasation. 2. Multiple tandem left SFA atheromatous stenoses of probable hemodynamic significance. Correlate with ABIs. Electronically Signed   By: JONETTA Faes M.D.   On: 06/06/2024 15:23   IR US  Guide Vasc Access Right Result Date: 06/06/2024 CLINICAL DATA:  Enlarging left thigh hematoma EXAM: LEFT EXTREMITY ARTERIOGRAPHY; IR ULTRASOUND GUIDANCE VASC ACCESS RIGHT ANESTHESIA/SEDATION: Intravenous Fentanyl  50mcg and Versed  1mg  were administered by RN during a total moderate (conscious) sedation time of 23 minutes; the patient's level of consciousness and physiological / cardiorespiratory status were monitored continuously by radiology RN under my direct supervision. MEDICATIONS: Lidocaine  1% subcutaneous CONTRAST:  40mL OMNIPAQUE  IOHEXOL  300 MG/ML SOLN, 10mL OMNIPAQUE  IOHEXOL  300 MG/ML SOLN PROCEDURE: The procedure, risks (including but not limited to bleeding, infection, organ damage ), benefits, and alternatives were explained to the patient. Questions regarding the procedure were encouraged and answered. The patient understands and consents to the procedure. Right femoral region prepped and draped in usual sterile fashion. Maximal barrier sterile technique was utilized including caps, mask, sterile gowns, sterile gloves, sterile drape, hand hygiene and skin antiseptic. The right common femoral artery was localized under ultrasound. Under real-time ultrasound guidance, the vessel was accessed with a 21-gauge micropuncture needle, exchanged over a 018 guidewire for a transitional dilator, through which a 035 guidewire was advanced. Over this, a 5 [place] vascular sheath was placed,  through which a 5 [place] C2 catheter was  advanced and used to selectively catheterize the proximal left common femoral artery for selective left lower extremity arteriography in multiple projections. The catheter and sheath were removed and hemostasis achieved with the aid of the Celt device under ultrasound guidance. The patient tolerated the procedure well. COMPLICATIONS: None immediate FINDINGS: Common femoral widely patent. Extensive atheromatous plaque through the left SFA with tandem areas of moderate to severe short-segment stenosis. Deep femoral branches unremarkable. Proximal popliteal artery patent. No evidence of active extravasation, pseudoaneurysm, AVM, or other lesion to suggest a site or etiology of the patient's hematoma. IMPRESSION: 1. No acute arterial lesion or active extravasation. 2. Multiple tandem left SFA atheromatous stenoses of probable hemodynamic significance. Correlate with ABIs. Electronically Signed   By: JONETTA Faes M.D.   On: 06/06/2024 15:23   IMPRESSION: Fecal occult positive stool Microcytic anemia Acute diarrhea Hypoxemic respiratory failure, COVID-19 positive  Congestive heart failure COPD on 4-6 L nasal cannula baseline History end-stage renal disease status post kidney transplant 2012  PLAN: -Advanced/increased oxygen  requirements overnight, bowel movement yesterday documented as brown in color -Would not proceed with endoscopy at this juncture given her progressive hypoxemia and lack of overt GI blood loss -Trend H/H, transfuse for hemoglobin less than 7 -Ok for empiric IV PPI Q12Hr  -Follow-up stool tests -[facility] will sign off and be available as needed   LOS: 15 days   [name], [facility]

## 2024-06-08 NOTE — Progress Notes (Addendum)
 Patient progress update: 06/08/24 1:25 PM  Since rounding on this patient this AM, patient has had worsening of acute on chronic respiratory failure with increasing HHFNC, currently on 30L/min, 50% FiO2, at 94% SaO2.  CXR without overt pulmonary edema or major changes since last CXR. TTE with new severely elevated pulmonary artery pressure and estimated RVSP of 61.8 mmHg, a major change from 03/2024 TTE which showed mild elevation on PAP.  Given the sudden change in this patient with prolonged hospitalization in the setting on COVID PNA, without anticoagulation for four days due to large L thigh hematoma and suspected GI bleed (FOBT+), requiring blood transfusion, will proceed with PE rule out.  Initially, ordered a NM perfusion scan but was unable to reach tech by page or phone number per operator.   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 We will proceed with CTA PE study as results will guide management in this medically complex patient. Team member, Dr. Myrna, discussed the test, risks and benefits with patient, and she agreed to proceed.   As of this AM, patient only had one PIV, which is now occluded. Currently working to see if patient is elegible for PICC vs central line.   Will continue to discuss with on-call team as tests become available.  Hadassah Kristy Ahr, MD [facility] Internal Medicine Program - PGY-3 06/08/2024, 1:24 PM On call pager [phone].

## 2024-06-08 NOTE — Progress Notes (Signed)
 Patient has returned to bedside from CT - unable to complete due to failed IV access. IV team is at the bedside now.

## 2024-06-08 NOTE — Progress Notes (Signed)
 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  Overnight Events: NAE  Pt seen and examined at the bedside this morning. She is having increased dyspnea this morning and reports that yesterday it was very difficult to catch her breath. She reports continued leg pain and that is it still difficult to stand on it. She is also complaining of blood in her stool and frequent diarrhea over the past week.     Objective:  Vital signs in last 24 hours: Vitals:   06/08/24 0754 06/08/24 0803 06/08/24 0806 06/08/24 0808  BP: 135/69     Pulse: 89 83    Resp: 20     Temp: 98.1 F (36.7 C)     TempSrc: Oral     SpO2: 94% 92% 93% 93%  Weight:      Height:       Supplemental O2: HFNC SpO2: 93 % O2 Flow Rate (L/min): 25 L/min FiO2 (%): 66 %   Physical Exam:   Const: Awake, alert in NAD,  HENT: Normocephalic, atraumatic, mucus membranes moist with [place] in place Card: RRR, No pitting edema on LE's bilaterally  Resp: Tachypnea, LCTAB, no crackles or wheezed  Abd: Soft, NTND Extremities: Warm, Left thigh wrapped in ace bandage. Hematoma present from proximal tibia to proximal thigh. Pulses intact distally.    Filed Weights   06/06/24 0500 06/07/24 0500 06/08/24 0500  Weight: 49.9 kg 49.4 kg 49.4 kg     Intake/Output Summary (Last 24 hours) at 06/08/2024 0838 Last data filed at 06/07/2024 1800 Gross per 24 hour  Intake 840 ml  Output --  Net 840 ml   Net IO Since Admission: 10,348.29 mL [06/08/24 0838]  Recent Labs    06/07/24 1646 06/07/24 2045 06/08/24 0546  GLUCAP 133* 161* 95     Pertinent  Labs:    Latest Ref Rng & Units 06/08/2024    4:04 AM 06/07/2024    3:10 AM 06/06/2024    2:22 PM  CBC  WBC 4.0 - 10.5 K/uL 14.6  14.8  11.3   Hemoglobin 12.0 - 15.0 g/dL 6.7  7.1  7.9   Hematocrit 36.0 - 46.0 % 19.0  20.0  22.3   Platelets 150 - 400 K/uL 165  157  157        Latest Ref Rng & Units 06/08/2024    4:04 AM 06/07/2024   10:23 AM 06/07/2024    3:10 AM  CMP  Glucose 70 - 99 mg/dL 87   85   BUN 8 - 23 mg/dL 31   36   Creatinine 9.55 - 1.00 mg/dL 8.14   7.95   Sodium 864 - 145 mmol/L 135   133   Potassium 3.5 - 5.1 mmol/L 4.3   4.7   Chloride 98 - 111 mmol/L 103   103   CO2 22 - 32 mmol/L 23   23   Calcium  8.9 - 10.3 mg/dL 8.5   8.1   Total Protein 6.5 - 8.1 g/dL  4.9    Total Bilirubin 0.0 - 1.2 mg/dL  0.9    Alkaline Phos 38 - 126 U/L  43    AST 15 - 41 U/L  39    ALT 0 - 44 U/L  17      Imaging: No results found.    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   #Acute on Chronic hypoxic respiratory failure( 4-6 L supplemental oxygen ) # COVID-pneumonia. # Bronchopneumonia # Asthma-COPD overlap syndrome -s/p 7 day course Zosyn  for PNA coverage, completed decadron  on 07/13. -Will continue  baricitinib  for 14 day course through 7/20 -LE U/S without evidence of deep vein thrombosis 7/14 -Continue with Brovana /Yupelri /Pulmicort /as needed DuoNebs. -Home requirements are 4L resting and 6L when active. Will continue to wean slowly to maintain 88-92%  7/20: Overnight pt required 25L HHFNC. Yesterday had been weaned down to [facility]. Today on exam, she had increased dyspnea and paused multiple times during our conversation to catch her breath. Pt has been off of VTE prophylaxis for 5 days due to active hematoma and GI bleed. Will obtain Echo to evaluate for R heart strain and CXR to eval pulmonary edema. Low suspicion for infectious etiology at this time due to downtrending WBC and pt remaining afebrile.   #Left leg pain #Expanding hematoma #Symptomatic Anemia #GI Bleed -CT 7/16 showed two large hematomas extending from the L femoral neck to just above the knee. Orthopedics consulted 7/18. LLE angiogram performed with IR and unremarkable without extravasation. Continue compression with Ace Bandage.  -s/p 1unit PRBC 7/18.   -[facility] consulted for melena and post FOBT. Due to high risk with anesthesia, they do not wish to scope at this time.  -Hgb 6.7 this AM. Will transfuse 1unit PRBC -CTM wil daily CBC's -Heparin , ASA on hold.  #Leukocytosis -WBC 14.6 this morning. Improving from days past.  -Low suspicion for infection due to pt remaining afebrile and recent completion of 7day course of zosyn . CT L femur was not concerning for soft tissue infection. UA negative -Will continue to monitor closely  #AKI on CKD 3A #S/p kidney transplant 2012. - S/p renal transplant in 2012.  - CW tacrolimus  4 mg twice daily. -Continue home prednisone  5 mg. - Daily RFP - Scr continuing to improve today. UA neg -Continue to monitor UOP closely   # Hypertension -Continue with amlodipine  10 mg, and metoprolol  50 mg twice daily.  #Coronary artery disease. #History of diastolic CHF(EF 60 to 65%, G2  DD) -Zetia  10 mg daily. -Asprin on hold due to expanding hematoma.   Type 2 diabetes A1c 6.8 on 7/6.  Blood glucose stable.  Discontinued semglee  due to hypoglycemia. - SSI. - CBG  #GERD - Famotidine  20 mg daily.  Chronic thrombocytopenia -Platelets stable at 160K  Disposition -Previous PT recommended HH. Will ask them to re-evaluate due to pt's debility and difficulty ambulating with LLE hematoma.   Incidental  finding on imaging Pulmonary arterial hypertension  Diet: Renal diet IVF: PO intake VTE: SCDs Start: 05/24/24 2222 Code: Full PT/OT: Pending ID:  Anti-infectives (From admission, onward)    Start     Dose/Rate Route Frequency Ordered Stop   05/28/24 1000  piperacillin -tazobactam (ZOSYN ) IVPB 3.375 g        3.375 g 12.5 mL/hr over 240 Minutes Intravenous Every 8 hours 05/28/24 0848 05/30/24 2203   05/25/24 2200  piperacillin -tazobactam (ZOSYN ) IVPB 2.25 g  Status:  Discontinued        2.25 g 100 mL/hr over 30 Minutes Intravenous Every 8 hours 05/25/24 1436 05/28/24 0848   05/25/24 1000  remdesivir  100 mg in sodium chloride  0.9 % 100 mL IVPB  Status:  Discontinued       Placed in Followed by Linked Group   100 mg 200 mL/hr over 30 Minutes Intravenous Daily 05/24/24 2133 05/25/24 1254   05/24/24 2300  remdesivir  200 mg in sodium chloride  0.9% 250 mL IVPB       Placed in Followed by Linked Group   200 mg 580 mL/hr over 30 Minutes Intravenous Once 05/24/24 2133 05/25/24 0721   05/24/24 2200  linezolid  (ZYVOX ) IVPB 600 mg  Status:  Discontinued        600 mg 300 mL/hr over 60 Minutes Intravenous Every 12 hours 05/24/24 1722 05/26/24 1104   05/24/24 2100  piperacillin -tazobactam (ZOSYN ) IVPB 3.375 g  Status:  Discontinued        3.375 g 12.5 mL/hr over 240 Minutes Intravenous Every 8 hours 05/24/24 1728 05/25/24 1436   05/24/24 1500  cefTRIAXone  (ROCEPHIN ) 1 g in sodium chloride  0.9 % 100 mL IVPB        1 g 200 mL/hr over 30 Minutes Intravenous  Once 05/24/24  1450 05/24/24 1544   05/24/24 1500  doxycycline  (VIBRAMYCIN ) 100 mg in sodium chloride  0.9 % 250 mL IVPB  Status:  Discontinued        100 mg 125 mL/hr over 120 Minutes Intravenous  Once 05/24/24 1450 05/24/24 1726      Anticipated discharge to pending medical stabilization.  Myrna Bitters, DO 06/08/2024, 8:38 AM Pager: [phone] [facility]  Please contact the on call pager after 5 pm and on weekends at [phone].

## 2024-06-08 NOTE — Progress Notes (Signed)
  Echocardiogram 2D Echocardiogram has been performed.  Koleen KANDICE Popper, RDCS 06/08/2024, 11:39 AM

## 2024-06-08 NOTE — Progress Notes (Signed)
 On-call physician notified of critical hgb, orders given. Will continue to monitor.

## 2024-06-08 NOTE — Progress Notes (Signed)
 RN concerned about pt's increased labored breathing and increased levels of confusion. Rapid called to come and assess pt. MD at bedside. Pt is on HHFNC 30L, and has had very noisy breathing with periodic dips in her oxygen  saturations. MD asked for RRT to come up to the floor to do additional nebulizer. Will continue to monitor.

## 2024-06-08 NOTE — Progress Notes (Addendum)
 IMTS Cross Cover Note  Received page from RN about worsening SOB. RN concerned over course of day with no improvement of her respiratory status. On 30L HHFNC. Tachypnea on exam but able to speak in short sentences. Rectal temp of 100. BP and HR have been stable. Has triple nebulizer therapy along with PRN duonebs (last received 1PM). RN notes mentation slight decline +/- confusion but alert on exam now. CXR this AM with worsening b/l airspace disease. Rapid response present at bedside. Exam with chronically ill-appearing female laying in bed with some increased work of breathing on HHFNC, decreased breath sounds, no crackles or wheezing. Primary team was planning for CTA to r/o PE given high suspicion but unable to transport this afternoon.  Plan: - Duoneb treatment - Consulted PCCM, appreciate recs  - Monitor vitals and continue supplemental O2 to maintain SpO2 >88%

## 2024-06-09 DIAGNOSIS — N1831 Chronic kidney disease, stage 3a: Secondary | ICD-10-CM | POA: Diagnosis not present

## 2024-06-09 DIAGNOSIS — Z515 Encounter for palliative care: Secondary | ICD-10-CM | POA: Diagnosis not present

## 2024-06-09 DIAGNOSIS — J9601 Acute respiratory failure with hypoxia: Secondary | ICD-10-CM | POA: Diagnosis not present

## 2024-06-09 DIAGNOSIS — I5032 Chronic diastolic (congestive) heart failure: Secondary | ICD-10-CM | POA: Diagnosis not present

## 2024-06-09 DIAGNOSIS — J9621 Acute and chronic respiratory failure with hypoxia: Secondary | ICD-10-CM | POA: Diagnosis not present

## 2024-06-09 DIAGNOSIS — Z7189 Other specified counseling: Secondary | ICD-10-CM | POA: Diagnosis not present

## 2024-06-09 DIAGNOSIS — J449 Chronic obstructive pulmonary disease, unspecified: Secondary | ICD-10-CM | POA: Diagnosis not present

## 2024-06-09 LAB — TYPE AND SCREEN
ABO/RH(D): O POS
Antibody Screen: NEGATIVE
Unit division: 0
Unit division: 0

## 2024-06-09 LAB — GASTROINTESTINAL PANEL BY PCR, STOOL (REPLACES STOOL CULTURE)

## 2024-06-09 LAB — BPAM RBC
Blood Product Expiration Date: 202507252359
Blood Product Expiration Date: 202508112359
ISSUE DATE / TIME: 202507200935
ISSUE DATE / TIME: 202507180915
Unit Type and Rh: 5100
Unit Type and Rh: 5100

## 2024-06-09 LAB — CBC
HCT: 24.7 % — ABNORMAL LOW (ref 36.0–46.0)
Hemoglobin: 8.5 g/dL — ABNORMAL LOW (ref 12.0–15.0)
MCH: 28.1 pg (ref 26.0–34.0)
MCHC: 34.4 g/dL (ref 30.0–36.0)
MCV: 81.5 fL (ref 80.0–100.0)
Platelets: 159 K/uL (ref 150–400)
RBC: 3.03 MIL/uL — ABNORMAL LOW (ref 3.87–5.11)
RDW: 19.9 % — ABNORMAL HIGH (ref 11.5–15.5)
WBC: 18.8 K/uL — ABNORMAL HIGH (ref 4.0–10.5)
nRBC: 0.4 % — ABNORMAL HIGH (ref 0.0–0.2)

## 2024-06-09 LAB — RENAL FUNCTION PANEL
Albumin: 2.8 g/dL — ABNORMAL LOW (ref 3.5–5.0)
Anion gap: 10 (ref 5–15)
BUN: 20 mg/dL (ref 8–23)
CO2: 22 mmol/L (ref 22–32)
Calcium: 8.5 mg/dL — ABNORMAL LOW (ref 8.9–10.3)
Chloride: 105 mmol/L (ref 98–111)
Creatinine, Ser: 1.43 mg/dL — ABNORMAL HIGH (ref 0.44–1.00)
GFR, Estimated: 37 mL/min — ABNORMAL LOW (ref >60)
Glucose, Bld: 99 mg/dL (ref 70–99)
Phosphorus: 2.8 mg/dL (ref 2.5–4.6)
Potassium: 4.7 mmol/L (ref 3.5–5.1)
Sodium: 137 mmol/L (ref 135–145)

## 2024-06-09 LAB — GLUCOSE, CAPILLARY
Glucose-Capillary: 85 mg/dL (ref 70–99)
Glucose-Capillary: 107 mg/dL — ABNORMAL HIGH (ref 70–99)

## 2024-06-09 MED ORDER — LORAZEPAM 2 MG/ML IJ SOLN
1.0000 mg | INTRAMUSCULAR | Status: DC | PRN
Start: 2024-06-09 — End: 2024-06-10
  Administered 2024-06-09: 1 mg via INTRAVENOUS
  Filled 2024-06-09: qty 1

## 2024-06-09 MED ORDER — ONDANSETRON 4 MG PO TBDP
4.0000 mg | ORAL_TABLET | Freq: Four times a day (QID) | ORAL | Status: DC | PRN
Start: 2024-06-09 — End: 2024-06-11

## 2024-06-09 MED ORDER — MORPHINE SULFATE (CONCENTRATE) 10 MG /0.5 ML PO SOLN
5.0000 mg | ORAL | Status: DC | PRN
  Administered 2024-06-09: 5 mg via ORAL
  Filled 2024-06-09: qty 0.5

## 2024-06-09 MED ORDER — ACETAMINOPHEN 650 MG RE SUPP: 650.0000 mg | Freq: Four times a day (QID) | RECTAL | Status: DC | PRN

## 2024-06-09 MED ORDER — LEVOFLOXACIN 750 MG PO TABS
750.0000 mg | ORAL_TABLET | ORAL | Status: DC
  Administered 2024-06-09: 750 mg via ORAL
  Filled 2024-06-09: qty 1

## 2024-06-09 MED ORDER — ACETAMINOPHEN 325 MG PO TABS: 650.0000 mg | ORAL_TABLET | Freq: Four times a day (QID) | ORAL | Status: DC | PRN

## 2024-06-09 MED ORDER — MORPHINE 100MG IN NS 100ML (1MG/ML) PREMIX INFUSION
4.0000 mg/h | INTRAVENOUS | Status: DC
  Administered 2024-06-09: 1 mg/h via INTRAVENOUS
  Filled 2024-06-09: qty 100

## 2024-06-09 MED ORDER — LORAZEPAM 2 MG/ML PO CONC: 1.0000 mg | ORAL | Status: DC | PRN

## 2024-06-09 MED ORDER — LORAZEPAM 0.5 MG PO TABS
0.5000 mg | ORAL_TABLET | Freq: Once | ORAL | Status: AC
  Administered 2024-06-09: 0.5 mg via ORAL
  Filled 2024-06-09: qty 1

## 2024-06-09 MED ORDER — TRAZODONE HCL 50 MG PO TABS: 25.0000 mg | ORAL_TABLET | Freq: Every evening | ORAL | Status: DC | PRN

## 2024-06-09 MED ORDER — LORAZEPAM 1 MG PO TABS: 1.0000 mg | ORAL_TABLET | ORAL | Status: DC | PRN

## 2024-06-09 MED ORDER — MORPHINE BOLUS VIA INFUSION
1.0000 mg | INTRAVENOUS | Status: DC | PRN
  Administered 2024-06-09 (×3): 1 mg via INTRAVENOUS

## 2024-06-09 MED ORDER — TORSEMIDE 20 MG PO TABS
60.0000 mg | ORAL_TABLET | Freq: Two times a day (BID) | ORAL | Status: DC
  Administered 2024-06-09: 60 mg via ORAL
  Filled 2024-06-09: qty 3

## 2024-06-09 MED ORDER — DIPHENHYDRAMINE HCL 50 MG/ML IJ SOLN
12.5000 mg | INTRAMUSCULAR | Status: DC | PRN
Start: 2024-06-09 — End: 2024-06-11

## 2024-06-09 MED ORDER — LINEZOLID 600 MG PO TABS
600.0000 mg | ORAL_TABLET | Freq: Two times a day (BID) | ORAL | Status: DC
  Administered 2024-06-09: 600 mg via ORAL
  Filled 2024-06-09 (×2): qty 1

## 2024-06-09 MED ORDER — MORPHINE SULFATE (CONCENTRATE) 10 MG /0.5 ML PO SOLN: 5.0000 mg | ORAL | Status: DC | PRN

## 2024-06-09 MED ORDER — ONDANSETRON HCL 4 MG/2ML IJ SOLN: 4.0000 mg | Freq: Four times a day (QID) | INTRAMUSCULAR | Status: DC | PRN

## 2024-06-09 MED ORDER — SENNA 8.6 MG PO TABS
1.0000 | ORAL_TABLET | Freq: Every evening | ORAL | Status: DC | PRN
Start: 2024-06-09 — End: 2024-06-10

## 2024-06-09 MED ORDER — FUROSEMIDE 40 MG PO TABS
80.0000 mg | ORAL_TABLET | Freq: Once | ORAL | Status: AC
  Administered 2024-06-09: 80 mg via ORAL
  Filled 2024-06-09: qty 2

## 2024-06-09 NOTE — Progress Notes (Signed)
 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 No f/c/r, LL edema, CP, N/V, abd pain, rash, melena, or blood per rectum. Given Lasix  120 mg, Solumedrol 125 mg, Zosyn , and Linezolid . Reportedly oxygen  saturation was around 55% on 4 L, treated with a DuoNeb with improvement of saturation to 83% on NRM. On BiPAP 08/25/59%, RR 15, saturating at 95%. COVID positive.   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 On BiPAP 08/25/59%, RR 15, saturating at 95%. 05/24/2024: will admit to ICU 7/7 placed on heated HHFNC , off bipap 7/13 transfer out of ICU 7/18 large left thigh hematoma, left lower extremity arteriogram with no active bleed 7/20 Re-consulted for worsening hypoxia    Interim History / Subjective:   CTA ordered overnight but unable to be completed as she would lost IV access.  She refused central line placement Was on BiPAP overnight.  On high flow nasal cannula  now  Objective    Blood pressure (!) 156/69, pulse 100, temperature 99 F (37.2 C), resp. rate (!) 22, height 5' 2 (1.575 m), weight 55.6 kg, last menstrual period 03/04/2022, SpO2 96%.    Vent Mode: BIPAP;PCV FiO2 (%):  [40 %-60 %] 60 % Set Rate:  [16 bmp] 16 bmp PEEP:  [6 cmH20] 6 cmH20   Intake/Output Summary (Last 24 hours) at 06/09/2024 0851 Last data filed at 06/08/2024 1800 Gross per 24 hour  Intake 784.44 ml  Output --  Net 784.44 ml   Filed Weights   06/07/24 0500 06/08/24 0500 06/09/24 0434  Weight: 49.4 kg 49.4 kg 55.6 kg    Examination: Gen:   Chronically ill-appearing, moderate respiratory distress HEENT:  EOMI, sclera anicteric Neck:     No masses; no thyromegaly Lungs:   Tachypneic, use of accessory muscles CV:         Regular rate and rhythm; no murmurs Abd:      + bowel sounds; soft, non-tender; no palpable masses, no distension Ext:    No edema; adequate peripheral perfusion Neuro: alert and oriented x 3  Lab/imaging reviewed.  Significant for BUN/creatinine 20/1.43 WBC 18.3, hemoglobin 8.5 Chest x-ray today with worsening basal airspace disease.  No new imaging today.  Resolved problem list   Assessment and Plan  Acute on chronic hypoxemic respiratory failure due to COVID-pneumonia +/- aspiration PNA Underlying chronic obstructive pulmonary disease Acute kidney injury on chronic kidney disease stage IIIa, improving S/p kidney transplant 2012- prograf  and pred PTA; US  looks fine Chronic HFpEF with suspect groups 2/3 pulm HTN longstanding DM2  Worsening acute hypoxic respiratory failure  may be secondary to HAP, volume overload CT angio unable to be done as there is no IV access.  Will avoid empiric anticoagulation due to recent right thigh hematoma and possible GI bleed Continue incentive spirometry, high flow nasal cannula BiPAP as needed Restarted on Unasyn   Goals of care I recommended that she move to the ICU for higher level of care,  possible intubation and she will need a central line placement with no IV access.  She tells me that she does not want any aggressive interventions including intubation, CPR and wants to be kept comfortable. DNR order entered.  Discussed with primary team about her request for comfort care.  Alexandrina Fiorini MD [facility] See Amion for pager  If no response to pager , please call [phone] until 7pm After 7:00 pm call Elink  [phone] 06/09/2024, 8:59 AM

## 2024-06-09 NOTE — Progress Notes (Signed)
 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  Overnight Events: Pt lost peripheral access and declined central line placement. PICC/midline avoided due to CKD. CTA unable to be performed  Pt seen and examined at the bedside this morning. She is teary on exam stating that she doesn't want to go on dialysis anymore and she does not want a central line. Discussed that she would take oral medications but did not want anything further.     Objective:  Vital signs in last 24 hours: Vitals:   06/09/24 0754 06/09/24 0831 06/09/24 0849 06/09/24 1331  BP:  (!) 144/113 (!) 171/139   Pulse:  93 92   Resp:  20 16   Temp:      TempSrc:      SpO2: 96%  93% 92%  Weight:      Height:       Supplemental O2: HFNC SpO2: 92 % O2 Flow Rate (L/min): 30 L/min FiO2 (%): 60 %   Physical Exam:   Const: Awake, alert in NAD,  HENT: Normocephalic, atraumatic, mucus membranes moist with [place] in place Card: RRR, No pitting edema on LE's bilaterally  Resp: Tachypnea, LCTAB, no crackles or wheezes. Increased work of breathing.  Abd: Soft, NTND Extremities: Warm, Left thigh wrapped in ace bandage. Hematoma present from proximal tibia to proximal thigh. Pulses intact distally. RUE swelling   Filed Weights   06/07/24 0500 06/08/24 0500 06/09/24 0434  Weight: 49.4 kg 49.4 kg 55.6 kg     Intake/Output Summary (Last 24 hours) at 06/09/2024 1430 Last data filed at 06/09/2024 1100 Gross per 24 hour  Intake 784.44 ml  Output 825 ml  Net -40.56 ml   Net IO Since Admission: 10,787.73 mL [06/09/24 1430]  Recent Labs     06/08/24 1648 06/08/24 2235 06/09/24 0622  GLUCAP 168* 93 107*     Pertinent Labs:    Latest Ref Rng & Units 06/09/2024    4:24 AM 06/08/2024    2:14 PM 06/08/2024    4:04 AM  CBC  WBC 4.0 - 10.5 K/uL 18.8  16.5  14.6   Hemoglobin 12.0 - 15.0 g/dL 8.5  7.9  6.7   Hematocrit 36.0 - 46.0 % 24.7  22.5  19.0   Platelets 150 - 400 K/uL 159  160  165        Latest Ref Rng & Units 06/09/2024    4:24 AM 06/08/2024    4:04 AM 06/07/2024   10:23 AM  CMP  Glucose 70 - 99 mg/dL 99  87    BUN 8 - 23 mg/dL 20  31    Creatinine 9.55 - 1.00 mg/dL 8.56  8.14    Sodium 864 - 145 mmol/L 137  135    Potassium 3.5 - 5.1 mmol/L 4.7  4.3    Chloride 98 - 111 mmol/L 105  103    CO2 22 - 32 mmol/L 22  23    Calcium  8.9 - 10.3 mg/dL 8.5  8.5    Total Protein 6.5 - 8.1 g/dL   4.9   Total Bilirubin 0.0 - 1.2 mg/dL   0.9  Alkaline Phos 38 - 126 U/L   43   AST 15 - 41 U/L   39   ALT 0 - 44 U/L   17     Imaging: No results found.    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   7/21 1430: Palliative care consulted and family meeting was had with medical team and palliative present. The patient now  desires to become comfort care and to end aggressive treatment stating  I am so tired, I don't want to do this anymore.   #Acute on Chronic hypoxic respiratory failure( 4-6 L supplemental oxygen ) # COVID-pneumonia. # Bronchopneumonia # Asthma-COPD overlap syndrome -s/p 7 day course Zosyn  for PNA coverage, completed decadron  on 07/13. -Will continue baricitinib  for 14 day course through 7/20 -LE U/S without evidence of deep vein thrombosis 7/14 -Continue with Brovana /Yupelri /Pulmicort /as needed DuoNebs. -Home requirements are 4L resting and 6L when active. Will continue to wean slowly to maintain 88-92% -Pt's oxygen  requirements significantly increased over the past 36 hours. CTA PE study ordered yesterday, however pt unable to undergo scan due to loss of peripheral line and declination of central line.  -Concern for HCAP fue to worsening resp status and leukocytosis--will broadly cover with Linezolid  and levoquin due to pt having no IV access.   #Left leg pain #Expanding hematoma #Symptomatic Anemia #GI Bleed -CT 7/16 showed two large hematomas extending from the L femoral neck to just above the knee. Orthopedics consulted 7/18. LLE angiogram performed with IR and unremarkable without extravasation. Continue compression with Ace Bandage.  -s/p 1unit PRBC 7/18.   -[facility] consulted for melena and post FOBT. Due to high risk with anesthesia, they do not wish to scope at this time.  -s/p 1unit PRBC 7/20 -CTM wil daily CBC's -Heparin , ASA on hold.  #Leukocytosis -WBC 18.8 -Due to decreasing respiratory status and leukocytosis, will cover broadly for HCAP with levoquin and linezolid   #AKI on CKD 3A #S/p kidney transplant 2012. - S/p renal transplant in 2012.  - CW tacrolimus  4 mg twice daily. -Continue home prednisone  5 mg. - Daily RFP - Scr continuing to improve today. UA neg -Continue to monitor UOP closely   # Hypertension -Continue with amlodipine  10 mg, and metoprolol  50 mg  twice daily.  #Coronary artery disease. #History of diastolic CHF(EF 60 to 65%, G2 DD) -Zetia  10 mg daily. -Asprin on hold due to expanding hematoma.   Type 2 diabetes A1c 6.8 on 7/6.  Blood glucose stable.  Discontinued semglee  due to hypoglycemia. - SSI. - CBG  #GERD - Famotidine  20 mg daily.  Chronic thrombocytopenia -Platelets stable at 160K  Goals of Care Disposition -Previous PT recommended HH. Will ask them to re-evaluate due to pt's debility and difficulty  ambulating with LLE hematoma.  -Pt desires today to become DNR and to not obtain a central line. She states that she does not want to be intubated, shocked, or to go back on dialysis.   Incidental finding on imaging Pulmonary arterial hypertension  Diet: Renal diet IVF: PO intake VTE: SCDs Start: 05/24/24 2222 Code: Full PT/OT: Pending ID:  Anti-infectives (From admission, onward)    Start     Dose/Rate Route Frequency Ordered Stop   06/09/24 1000  linezolid  (ZYVOX ) tablet 600 mg        600 mg Oral Every 12 hours 06/09/24 0904 06/16/24 0959   06/09/24 1000  levofloxacin  (LEVAQUIN ) tablet 750 mg        750 mg Oral Every 48 hours 06/09/24 0904 06/17/24 0959   06/08/24 2000  ampicillin -sulbactam (UNASYN ) 1.5 g in sodium chloride  0.9 % 100 mL IVPB  Status:  Discontinued        1.5 g 200 mL/hr over 30 Minutes Intravenous Every 12 hours 06/08/24 1833 06/09/24 0904   05/28/24 1000  piperacillin -tazobactam (ZOSYN ) IVPB 3.375 g        3.375 g 12.5 mL/hr over 240 Minutes Intravenous Every 8 hours 05/28/24 0848 05/30/24 2203   05/25/24 2200  piperacillin -tazobactam (ZOSYN ) IVPB 2.25 g  Status:  Discontinued        2.25 g 100 mL/hr over 30 Minutes Intravenous Every 8 hours 05/25/24 1436 05/28/24 0848   05/25/24 1000  remdesivir  100 mg in sodium chloride  0.9 % 100 mL IVPB  Status:  Discontinued       Placed in Followed by Linked Group   100 mg 200 mL/hr over 30 Minutes Intravenous Daily 05/24/24 2133 05/25/24 1254    05/24/24 2300  remdesivir  200 mg in sodium chloride  0.9% 250 mL IVPB       Placed in Followed by Linked Group   200 mg 580 mL/hr over 30 Minutes Intravenous Once 05/24/24 2133 05/25/24 0721   05/24/24 2200  linezolid  (ZYVOX ) IVPB 600 mg  Status:  Discontinued        600 mg 300 mL/hr over 60 Minutes Intravenous Every 12 hours 05/24/24 1722 05/26/24 1104   05/24/24 2100  piperacillin -tazobactam (ZOSYN ) IVPB 3.375 g  Status:  Discontinued        3.375 g 12.5 mL/hr over 240 Minutes Intravenous Every 8 hours 05/24/24 1728 05/25/24 1436   05/24/24 1500  cefTRIAXone  (ROCEPHIN ) 1 g in sodium chloride  0.9 % 100 mL IVPB        1 g 200 mL/hr over 30 Minutes Intravenous  Once 05/24/24 1450 05/24/24 1544   05/24/24 1500  doxycycline  (VIBRAMYCIN ) 100 mg in sodium chloride  0.9 % 250 mL IVPB  Status:  Discontinued        100 mg 125 mL/hr over 120 Minutes Intravenous  Once 05/24/24 1450 05/24/24 1726      Anticipated discharge to pending medical stabilization.  [name] Bitters, DO 06/09/2024, 2:30 PM Pager: [phone] [facility]  Please contact the on call pager after 5 pm and on weekends at [phone].

## 2024-06-09 NOTE — Plan of Care (Signed)
  Problem: Coping: Goal: Psychosocial and spiritual needs will be supported Outcome: Progressing. Pt has decided to transition to comfort care. Patient is at peace with decision.

## 2024-06-09 NOTE — Progress Notes (Signed)
 Comfort measures only  Pt desires to end aggressive treatment and to become comfort measures only. Per patients desires, please leave oxygen  in place and avoid IV sedatives/narcotics until after the patient talks to pastoral care.   When the patient is ready, place on IV drip for pain and comfort management before removing oxygen  therapy.   Stephane Palin with palliative care is currently working on obtaining access for IV medications.

## 2024-06-09 NOTE — Progress Notes (Signed)
 OT Cancellation Note  Patient Details Name: [name] MRN: [id number] DOB: March 19, 1941   Cancelled Treatment:    Reason Eval/Treat Not Completed: Other (comment) (Per nursing asking to hold as Pallative Contulted and maybe going comfort.)  Warrick POUR OTR/L  Acute Rehab Services  [phone] office number   Warrick Berber 06/09/2024, 9:50 AM

## 2024-06-09 NOTE — Progress Notes (Signed)
 PT Cancellation Note  Patient Details Name: ARNEDA SAPPINGTON MRN: [id number] DOB: 1941/07/05   Cancelled Treatment:    Reason Eval/Treat Not Completed: (P) Medical issues which prohibited therapy;Other (comment) (Pt with medical decline and not currently medically appropriate due to respiratory status per chart review. Will continue to follow next date per PT plan of care if medically appropriate.)   Sevyn Paredez M Ashleynicole Mcclees 06/09/2024, 10:01 AM

## 2024-06-09 NOTE — Consult Note (Signed)
 Palliative Care Consult Note                                  Date: 06/09/2024   Patient Name: [name]  DOB: 1940/12/29  MRN: [id number]  Age / Sex: 83 y.o., female  PCP: Beam, Lamar POUR, MD Referring Physician: Rosan Dayton BROCKS, DO  Reason for Consultation: Establishing goals of care  HPI/Patient Profile: 83 y.o. 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  Patient found to have Covid-pneumonia. Transferred to ICU 7/5-7/13.  After initial improvement, on 7/21 the patient had worsening hypoxia. CCM wanted to move patient to ICU for higher level of care and possible intubation. The patient shared she no longer wanted aggressive interventions including intubation or CPR. Patient changed to DNR/DNI. Patient told CCM she wants to be made comfortable. Today after the primary team saw the patient there was a question of if she wanted to continue treating the treatable or wanted comfort measures.  PMT consulted to assist with goals of care.  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, hold ASA 2-3 weeks - consider plavix instead of ASA   Asthma    Cellulitis 12/19/2019   right upper arm   Coronary artery disease    cath 11/2007, grafts patent /  Nuclear, June, 2011, prior inferior MI with mild peri-infarct ischemia, anterior breast attenuation, EF 67%, done for renal transplant assessment / Low level exercise/Lexiscan  Myoview  (11/2013): EF 67%, no ischemi; normal study   Diabetes mellitus    type 2   ESRD on dialysis [facility])    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, 2012   Family history of breast cancer    Family  history of prostate cancer    GERD (gastroesophageal reflux disease)    GI bleed 11/26/2010   January, 2012 , AVM   Gout    History of methicillin resistant staphylococcus aureus (MRSA)    Hyperlipidemia    Hyperparathyroidism    Hypertension    Myocardial infarction [facility])    Osteoporosis    Pneumonia 08/2010    Hospitalization, October, 2011   Primary osteoarthritis, 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   S/P kidney transplant    September, 2012, [facility]   Subacromial impingement of 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    Subjective:   I have reviewed medical records including EPIC notes, labs and imaging, received update from nursing and attending team, assessed the patient and then met with the patient to discuss diagnosis prognosis, GOC, EOL wishes, disposition and options.  I introduced Palliative Medicine as specialized medical care for people living with serious illness. It focuses on providing relief from symptoms and stress of a serious illness. The goal is to improve quality of life for both the patient and the family.  Created space and opportunity for patient  and family to explore thoughts and feelings regarding current medical situation. Values and goals of care important to patient and family were attempted to be elicited.    Today's Discussion: Patient lying in bed with her best friend/HCPOA Emmie Cumming at bedside. Patient's daughter, granddaughter, and deacon have come to bedside  for GOC discussion. Attending team present.  When asked to share their understanding of the patient's medical condition her daughter responded She's DNR and its the end. Her friend shared her understanding including her chronic conditions and acute hospitalization. Attending team then shared their understanding of her hospitalization and the events and discussion that happened this morning.  We discussed the patient's DNR/DNI status and what that meant. We discussed the difference between an aggressive medical  intervention path and a comfort focused path. The patient shared she is tired and would like to transition to full comfort. We discussed that once transitioned to comfort measures the patient would no longer receive aggressive medical interventions such as continuous vital signs, lab work, radiology testing, or medications not focused on comfort. All care would focus on how the patient is looking and feeling. This would include management of any symptoms that may cause discomfort, pain, shortness of breath, cough, nausea, agitation, anxiety, and/or secretions etc. Symptoms would be managed with medications and other non-pharmacological interventions. We discussed when and how this transition could happen. They would like to have family visit and prayer before proceeding with HHFNC withdrawal late this afternoon. Attending team placed comfort orders. Patient did not have IV access so subcutaneous catheter placed.  Emotional support provided. Questions and concerns were addressed. The family was encouraged to call with questions or concerns. PMT will continue to support holistically.  Review of Systems  Constitutional:  Positive for fatigue.  Respiratory:  Positive for shortness of breath.     Objective:   Primary Diagnoses: Present on Admission:  Asthma-COPD overlap syndrome (HCC)  Acute hypoxic respiratory failure (HCC)  GERD (gastroesophageal reflux disease)   Physical Exam Vitals reviewed.  Constitutional:      General: She is not in acute distress.    Appearance: She is ill-appearing.     Comments: Heated high flow  HENT:     Head: Normocephalic and atraumatic.  Cardiovascular:     Rate and Rhythm: Normal rate.  Skin:    General: Skin is warm and dry.  Neurological:     Mental Status: She is alert and oriented to person, place, and time.  Psychiatric:        Mood and Affect: Mood normal.        Behavior: Behavior normal.     Vital Signs:  BP (!) 171/139 (BP Location: Right  Arm)   Pulse 92   Temp 99 F (37.2 C)   Resp 16   Ht 5' 2 (1.575 m)   Wt 55.6 kg   LMP 03/04/2022   SpO2 92%   BMI 22.42 kg/m     Advanced Care Planning:   Existing Vynca/ACP Documentation: None  Primary Decision Maker: PATIENT  Code Status/Advance Care Planning: DNR   Assessment & Plan:   SUMMARY OF RECOMMENDATIONS   Continue DNR/DNI Transition to Full Comfort Care- plan to wean HHFNC after family prayer this afternoon Symptom Management orders placed by primary team Subcutaneous catheter placed for meds Continued PMT support    Discussed with: bedside RN and primary team  Time Total: 120 minutes    Thank you for allowing us  to participate in the care of Chiquita DELENA Lewis PMT will continue to support holistically.  Signed by: Stephane Palin, NP Palliative Medicine Team  Team Phone # [phone] (Nights/Weekends)  06/09/2024, 4:04 PM

## 2024-06-09 NOTE — Progress Notes (Signed)
 Pt taken to CT and back on BIPAP without complications

## 2024-06-09 NOTE — Progress Notes (Incomplete)
 Family wanting everyone to be in room for prayer when pastor arrived. I encouraged to use phones and place on speaker or record without success. Any many as could be accommodated were in room for prayer.  After prayer time, I requested visitors to come out one or two at a time and ungown. Requested family to limit

## 2024-06-09 NOTE — Progress Notes (Signed)
 CJU:Rnwdlou for PIV  Able to find viable vein for venipuncture. As IV was being inserted via US  Pts granddaughters voice escalated and she stated, you need hurry up you are hurting my grandmother! Pts granddaughter continued to make rude comments and increase in volume. Procedure ended and notified care RN of unsuccessful attempt.  Care RN will message when granddaughter is no longer at bedside.

## 2024-06-09 NOTE — Progress Notes (Addendum)
 Informed by RN that patient does not have any more IV access.  IV team was consulted to try to get more peripheral access.  IV team was not successful.  Patient is not able to get a midline or PICC line given that she has fistula and might be a future dialysis candidate.  Left arm is not usable due to fistula.  With all this we need to place a central line.  I gathered the kit and brought it up to bedside.  However when going over the informed consent process, the patient declined to get a central line at this time.  She understands the risk and benefits and decided ultimately not to move forward with central line.  I informed her that she is putting himself at risk with no IV access, and she understood the risk and accepted it.  While ultrasound was in the room, I attempted place ultrasound-guided IV peripherally, but was unsuccessful x 3.  Given patient was to get 60 mg IV of Lasix , and has not received due to poor IV access, will give oral Lasix . Given that she his clinically improved over time on the BiPAP, holding off on CT will be okay. Will discuss with day team about V/Q can in the morning.  Patient informed to notify me or RN if she would like central line placed.

## 2024-06-09 NOTE — Progress Notes (Signed)
 Patient best friend at bedside and 3 family members. Explained visitation guidelines for covid and comfort care. Requested comfort care tray for visitors lounge. Lots of family members rotating in/out. Ann Nena Hoard, RN

## 2024-06-10 DIAGNOSIS — Z66 Do not resuscitate: Secondary | ICD-10-CM

## 2024-06-10 DIAGNOSIS — Z7189 Other specified counseling: Secondary | ICD-10-CM | POA: Diagnosis not present

## 2024-06-10 DIAGNOSIS — Z515 Encounter for palliative care: Secondary | ICD-10-CM

## 2024-06-10 MED ORDER — LORAZEPAM 2 MG/ML IJ SOLN: 1.0000 mg | INTRAMUSCULAR | Status: DC | PRN

## 2024-06-10 MED ORDER — DIAZEPAM 5 MG/ML IJ SOLN
5.0000 mg | INTRAMUSCULAR | Status: DC
  Administered 2024-06-10 (×2): 5 mg via INTRAVENOUS
  Filled 2024-06-10 (×2): qty 2

## 2024-06-10 MED ORDER — GLYCOPYRROLATE 0.2 MG/ML IJ SOLN
0.4000 mg | INTRAMUSCULAR | Status: DC
  Administered 2024-06-10 (×2): 0.4 mg via INTRAVENOUS
  Filled 2024-06-10 (×2): qty 2

## 2024-06-20 NOTE — Progress Notes (Signed)
 Leakage observed from ostomy site. Daughter had changed the bag following instructions provided by [facility] nurse. This reporting nurse evaluated the leaking ostomy and made some changes during the reapplication of the ostomy. Monitoring to continue

## 2024-06-20 NOTE — Death Summary Note (Addendum)
  Name: [name] MRN: [id number] DOB: Jan 27, 1941 83 y.o.  Date of Admission: 05/24/2024  1:19 PM Date of Discharge: 06/11/2024 Attending Physician: [facility]  Discharge Diagnosis: Principal Problem:   Acute on chronic hypoxic respiratory failure (HCC) Active Problems:   Asthma-COPD overlap syndrome (HCC)   GERD (gastroesophageal reflux disease)   Acute on Chronic hypoxemic respiratory failure (HCC)   Acute on Chronic Diastolic Congestive heart failure (HCC)   L Thigh Hematoma   Pneumonia due to COVID-19 virus   Melena   Hematoma of left thigh   Acute blood loss anemia   Cause of death: Cardiorespiratory arrest Time of death: June 30, 1501 on 06/22/24  Disposition and follow-up:   [name] was discharged from [facility] in expired condition.    Hospital Course: [name] an 83 yo person with Phx that included HTN, COPD-asthma syndrome on chronic supplemental O2 therapy, CAD s/p CABG, ESRD s/p renal transplant in 07-01-11, multiple recent hospitalizations, admitted to [facility] for COVID pneumonia. Her hospital course was complicated by needing ICU admission early during her hospitalization, heated high flow supplemental O2 needs, and acute on chronic anemia due to L thigh hematoma and suspected GI bleeding requiring blood transfusions. Patient was initially improving, however, she had an acute worsening of hypoxic respiratory failure on 06/08/2024. After discussing her needs for higher level of care in the ICU, patient decided to change code status to DNR/DNI. On 7/21, patient made decision to transition to comfort case. Patient died on June 22, 2024 at 06-30-01.  Signed: Elnora Ip, MD 06/11/2024, 4:37 PM

## 2024-06-20 NOTE — Plan of Care (Signed)
  Problem: Respiratory: Goal: Will maintain a patent airway Outcome: Progressing   Problem: Pain Managment: Goal: General experience of comfort will improve and/or be controlled Outcome: Progressing

## 2024-06-20 NOTE — Plan of Care (Signed)
  Problem: Coping: Goal: Psychosocial and spiritual needs will be supported Outcome: Progressing   Problem: Respiratory: Goal: Will maintain a patent airway Outcome: Progressing   Problem: Clinical Measurements: Goal: Will remain free from infection Outcome: Progressing   Problem: Clinical Measurements: Goal: Respiratory complications will improve Outcome: Progressing   Problem: Pain Managment: Goal: General experience of comfort will improve and/or be controlled Outcome: Progressing

## 2024-06-20 NOTE — Progress Notes (Signed)
 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 Her course was complicated by worsening respiratory failure. Pt ultimately decided to become comfort care on 06/09/2024.   Overnight Events: No acute events overnight.   Pt resting comfortably in bed with family at the bedside.    Objective:  Vital signs in last 24 hours: Vitals:   06/09/24 0831 06/09/24 0849 06/09/24 1331 2024/07/02 0112  BP: (!) 144/113 (!) 171/139  (!) 112/59  Pulse: 93 92  98  Resp: 20 16  19   Temp:    98.8 F (37.1 C)  TempSrc:    Oral  SpO2:  93% 92% 91%  Weight:      Height:       Supplemental O2: HFNC SpO2: 91 % O2 Flow Rate (L/min): 30 L/min FiO2 (%): (!) 4 %   Physical Exam:   Const: Awake, alert in NAD,  HENT: Normocephalic, atraumatic, mucus membranes moist with Enlow in place Card: RRR, No pitting edema on LE's bilaterally  Resp: Tachypnea, LCTAB, no crackles or wheezes. Increased work of breathing.  Abd: Soft, NTND Extremities: Warm, Left thigh wrapped in ace bandage. Hematoma present from proximal tibia to proximal thigh. Pulses intact distally. RUE swelling   Filed Weights   06/07/24 0500 06/08/24 0500 06/09/24 0434  Weight: 49.4 kg 49.4 kg 55.6 kg     Intake/Output Summary (Last 24 hours) at July 02, 2024 1254 Last data filed at 06/09/2024 2053 Gross per 24 hour  Intake 5.34 ml  Output --  Net 5.34 ml   Net IO Since Admission: 10,793.07 mL [Jul 02, 2024 1254]  Recent Labs    06/08/24 1648 06/08/24 2235 06/09/24 0622  GLUCAP 168* 93 107*      Pertinent Labs:    Latest Ref Rng & Units 06/09/2024    4:24 AM 06/08/2024    2:14 PM 06/08/2024    4:04 AM  CBC  WBC 4.0 - 10.5 K/uL 18.8  16.5  14.6   Hemoglobin 12.0 - 15.0 g/dL 8.5  7.9  6.7   Hematocrit 36.0 - 46.0 % 24.7  22.5  19.0   Platelets 150 - 400 K/uL 159  160  165        Latest Ref Rng & Units 06/09/2024    4:24 AM 06/08/2024    4:04 AM 06/07/2024   10:23 AM  CMP  Glucose 70 - 99 mg/dL 99  87    BUN 8 - 23 mg/dL 20  31    Creatinine 9.55 - 1.00 mg/dL 8.56  8.14    Sodium 864 - 145 mmol/L 137  135    Potassium 3.5 - 5.1 mmol/L 4.7  4.3    Chloride 98 - 111 mmol/L 105  103    CO2 22 - 32 mmol/L 22  23    Calcium  8.9 - 10.3 mg/dL 8.5  8.5    Total Protein 6.5 - 8.1 g/dL   4.9   Total Bilirubin 0.0 - 1.2 mg/dL   0.9   Alkaline Phos 38 - 126 U/L   43   AST 15 - 41 U/L   39  ALT 0 - 44 U/L   17     Imaging: No results found.    Assessment/Plan:   Principal Problem:   Acute hypoxic respiratory failure (HCC) Active Problems:   Asthma-COPD overlap syndrome (HCC)   GERD (gastroesophageal reflux disease)   Chronic hypoxemic respiratory failure (HCC)   Congestive heart failure (HCC)   L Thigh Hematoma   Pneumonia due to COVID-19 virus   Melena   Hematoma of left thigh   Comfort measures only #Acute on Chronic hypoxic respiratory failure( 4-6 L supplemental oxygen ) # COVID-pneumonia. # Bronchopneumonia # Asthma-COPD overlap syndrome -Pt resting comfortably today. Morphine  running 2mg /hr. Required ativan  x1 in last 24 hours.  -Discussed with family at bedside and instructed them to notify the medical team if pt becomes agitated or uncomfortable.       Anti-infectives (From admission, onward)    Start     Dose/Rate Route Frequency Ordered Stop   06/09/24 1000  linezolid  (ZYVOX ) tablet 600 mg  Status:  Discontinued        600 mg Oral Every 12 hours 06/09/24 0904 06/09/24 1453   06/09/24 1000  levofloxacin  (LEVAQUIN ) tablet 750 mg  Status:   Discontinued        750 mg Oral Every 48 hours 06/09/24 0904 06/09/24 1453   06/08/24 2000  ampicillin -sulbactam (UNASYN ) 1.5 g in sodium chloride  0.9 % 100 mL IVPB  Status:  Discontinued        1.5 g 200 mL/hr over 30 Minutes Intravenous Every 12 hours 06/08/24 1833 06/09/24 0904   05/28/24 1000  piperacillin -tazobactam (ZOSYN ) IVPB 3.375 g        3.375 g 12.5 mL/hr over 240 Minutes Intravenous Every 8 hours 05/28/24 0848 05/30/24 2203   05/25/24 2200  piperacillin -tazobactam (ZOSYN ) IVPB 2.25 g  Status:  Discontinued        2.25 g 100 mL/hr over 30 Minutes Intravenous Every 8 hours 05/25/24 1436 05/28/24 0848   05/25/24 1000  remdesivir  100 mg in sodium chloride  0.9 % 100 mL IVPB  Status:  Discontinued       Placed in Followed by Linked Group   100 mg 200 mL/hr over 30 Minutes Intravenous Daily 05/24/24 2133 05/25/24 1254   05/24/24 2300  remdesivir  200 mg in sodium chloride  0.9% 250 mL IVPB       Placed in Followed by Linked Group   200 mg 580 mL/hr over 30 Minutes Intravenous Once 05/24/24 2133 05/25/24 0721   05/24/24 2200  linezolid  (ZYVOX ) IVPB 600 mg  Status:  Discontinued        600 mg 300 mL/hr over 60 Minutes Intravenous Every 12 hours 05/24/24 1722 05/26/24 1104   05/24/24 2100  piperacillin -tazobactam (ZOSYN ) IVPB 3.375 g  Status:  Discontinued        3.375 g 12.5 mL/hr over 240 Minutes Intravenous Every 8 hours 05/24/24 1728 05/25/24 1436   05/24/24 1500  cefTRIAXone  (ROCEPHIN ) 1 g in sodium chloride  0.9 % 100 mL IVPB        1 g 200 mL/hr over 30 Minutes Intravenous  Once 05/24/24 1450 05/24/24 1544   05/24/24 1500  doxycycline  (VIBRAMYCIN ) 100 mg in sodium chloride  0.9 % 250 mL IVPB  Status:  Discontinued        100 mg 125 mL/hr over 120 Minutes Intravenous  Once 05/24/24 1450 05/24/24 1726      Expected in hospital death in next 24 hours.   Myrna Bitters, DO 2024/06/14, 12:54 PM Pager: [phone] [facility]  Please contact the on  call pager after 5 pm and on weekends at [phone].

## 2024-06-20 NOTE — Progress Notes (Signed)
 This chaplain responded to the medical team's consult for major life transition. The chaplain observed the Pt. resting comfortably at the time of the visit.   Family is at the bedside and accepts the invitation for story telling while the RN participates in patient care. The chaplain understands love is shared among her children and grand children.  The Pt. Is supported by [employer]. An invitation was extended for F/U spiritual care as needed.  Chaplain Leeroy Hummer [phone]

## 2024-06-20 NOTE — Progress Notes (Signed)
   Palliative Medicine Inpatient Follow Up Note HPI: 83 y.o. 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   Today's Discussion 07-Jul-2024  *Please note that this is a verbal dictation therefore any spelling or grammatical errors are due to the Dragon Medical One system interpretation.  Chart reviewed inclusive of vital signs, progress notes, laboratory results, and diagnostic images.   I met with [name], [name], and [name] this morning. Reviewed patients symptom burden - she is more dyspneic this morning therefore a morphine  bolus was given.   Discussed with patients family physiological changes at end of life inclusive of death rattle, cool extremities, mottling, breathing cadence irregularity, and increased somnolence.   We reviewed the plan to continue present measures and adding medications for death rattle as well as generalized anxiety.   We discussed slowly weaning [name] to room air and more aggressively supporting symptoms with medication management throughout the day.   Created space and opportunity for patients family to explore thoughts feelings and fears regarding [name]'s current medical situation. They desire above all else that she is comfortable.   Questions and concerns addressed/Palliative Support Provided.   Objective Assessment: Vital Signs Vitals:   06/09/24 1331 07-Jul-2024 0112  BP:  (!) 112/59  Pulse:  98  Resp:  19  Temp:  98.8 F (37.1 C)  SpO2: 92% 91%    Intake/Output Summary (Last 24 hours) at 07-07-24 0944 Last data filed at 06/09/2024 2053 Gross per 24 hour  Intake 5.34 ml  Output 825 ml  Net -819.66 ml   Last Weight  Most recent update:  06/09/2024  4:59 AM    Weight  55.6 kg (122 lb 9.2 oz)            Gen:  Elderly AA F cchronically ill appearing HEENT: Dry mucous membranes CV: Regular rate and rhythm  PULM: On 4LPM [place], breathing is tachyneic ABD: soft/nontender  EXT: Cool distal digits, weak pulses Neuro: Somnolent  SUMMARY OF RECOMMENDATIONS   DNAR/DNI  Comfort Care  Morphine  gtt with boluses  Added glycopyrrolate  ATC   Added diazepam  ATC  Additional comfort medications per [facility]  Unrestricted visitation  Anticipate in hospital death  Ongoing PMT support  Billing based on MDM: High  Problems Addressed: One or more chronic illnesses with severe exacerbation, progression, or side effects of treatment.  Amount and/or Complexity of Data: Category 1:Review of prior external note(s) from each unique source and Review of the result(s) of each unique test and Category 2:Independent interpretation of a test performed by another physician/other qualified health care professional (not separately reported)  Risks: Parenteral controlled substances and Decision not to resuscitate or to de-escalate care because of poor prognosis ______________________________________________________________________________________ [name] [facility] Palliative Medicine Team Team Cell Phone: [phone] Please utilize secure chat with additional questions, if there is no response within 30 minutes please call the above phone number  Palliative Medicine Team providers are available by phone from 7am to 7pm daily and can be reached through the team cell phone.  Should this patient require assistance outside of these hours, please call the patient's attending physician.

## 2024-06-20 NOTE — Progress Notes (Signed)
 Patient transfer from [address],alert,family at bedside,skin assessment done,bed in low position and call light in reach.

## 2024-06-20 NOTE — Progress Notes (Signed)
 Morphine  1mg /ml, 30ml wasted with Chasidy, LPN.

## 2024-06-20 DEATH — deceased
